# Patient Record
Sex: Female | Born: 1950
Health system: Southern US, Community
[De-identification: ages and names within clinical notes are randomized; demographics above are authoritative.]

## PROBLEM LIST (undated history)

## (undated) DIAGNOSIS — R000 Tachycardia, unspecified: Secondary | ICD-10-CM

## (undated) DIAGNOSIS — I714 Abdominal aortic aneurysm, without rupture, unspecified: Secondary | ICD-10-CM

## (undated) DIAGNOSIS — K219 Gastro-esophageal reflux disease without esophagitis: Secondary | ICD-10-CM

## (undated) DIAGNOSIS — I2510 Atherosclerotic heart disease of native coronary artery without angina pectoris: Secondary | ICD-10-CM

## (undated) DIAGNOSIS — Z952 Presence of prosthetic heart valve: Secondary | ICD-10-CM

## (undated) DIAGNOSIS — E7800 Pure hypercholesterolemia, unspecified: Secondary | ICD-10-CM

## (undated) DIAGNOSIS — M1990 Unspecified osteoarthritis, unspecified site: Secondary | ICD-10-CM

## (undated) DIAGNOSIS — I5042 Chronic combined systolic (congestive) and diastolic (congestive) heart failure: Secondary | ICD-10-CM

## (undated) DIAGNOSIS — J449 Chronic obstructive pulmonary disease, unspecified: Secondary | ICD-10-CM

## (undated) DIAGNOSIS — I350 Nonrheumatic aortic (valve) stenosis: Secondary | ICD-10-CM

## (undated) DIAGNOSIS — J302 Other seasonal allergic rhinitis: Secondary | ICD-10-CM

## (undated) DIAGNOSIS — J189 Pneumonia, unspecified organism: Secondary | ICD-10-CM

## (undated) DIAGNOSIS — Z720 Tobacco use: Secondary | ICD-10-CM

## (undated) DIAGNOSIS — J42 Unspecified chronic bronchitis: Secondary | ICD-10-CM

## (undated) DIAGNOSIS — H269 Unspecified cataract: Secondary | ICD-10-CM

## (undated) DIAGNOSIS — L0390 Cellulitis, unspecified: Secondary | ICD-10-CM

## (undated) DIAGNOSIS — I739 Peripheral vascular disease, unspecified: Secondary | ICD-10-CM

## (undated) DIAGNOSIS — C801 Malignant (primary) neoplasm, unspecified: Secondary | ICD-10-CM

## (undated) DIAGNOSIS — R918 Other nonspecific abnormal finding of lung field: Secondary | ICD-10-CM

## (undated) DIAGNOSIS — I255 Ischemic cardiomyopathy: Secondary | ICD-10-CM

## (undated) DIAGNOSIS — I340 Nonrheumatic mitral (valve) insufficiency: Secondary | ICD-10-CM

## (undated) DIAGNOSIS — Z9889 Other specified postprocedural states: Secondary | ICD-10-CM

## (undated) DIAGNOSIS — I219 Acute myocardial infarction, unspecified: Secondary | ICD-10-CM

## (undated) HISTORY — PX: MOUTH SURGERY: SHX715

## (undated) HISTORY — PX: CORONARY ANGIOPLASTY WITH STENT PLACEMENT: SHX49

## (undated) HISTORY — DX: Nonrheumatic aortic (valve) stenosis: I35.0

## (undated) HISTORY — DX: Other specified postprocedural states: Z98.890

## (undated) HISTORY — DX: Tobacco use: Z72.0

## (undated) HISTORY — DX: Unspecified cataract: H26.9

## (undated) HISTORY — DX: Gastro-esophageal reflux disease without esophagitis: K21.9

## (undated) HISTORY — DX: Abdominal aortic aneurysm, without rupture, unspecified: I71.40

## (undated) HISTORY — DX: Other seasonal allergic rhinitis: J30.2

## (undated) HISTORY — DX: Ischemic cardiomyopathy: I25.5

## (undated) HISTORY — DX: Pure hypercholesterolemia, unspecified: E78.00

## (undated) HISTORY — DX: Abdominal aortic aneurysm, without rupture: I71.4

## (undated) HISTORY — DX: Chronic combined systolic (congestive) and diastolic (congestive) heart failure: I50.42

---
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
## 1973-03-05 HISTORY — PX: GANGLION CYST EXCISION: SHX1691

## 1988-11-03 DIAGNOSIS — J189 Pneumonia, unspecified organism: Secondary | ICD-10-CM

## 1988-11-03 HISTORY — DX: Pneumonia, unspecified organism: J18.9

## 1998-03-02 ENCOUNTER — Emergency Department (HOSPITAL_COMMUNITY): Admission: EM | Admit: 1998-03-02 | Discharge: 1998-03-02 | Payer: Self-pay | Admitting: Emergency Medicine

## 1998-03-02 ENCOUNTER — Encounter: Payer: Self-pay | Admitting: Emergency Medicine

## 1999-08-21 ENCOUNTER — Other Ambulatory Visit: Admission: RE | Admit: 1999-08-21 | Discharge: 1999-08-21 | Payer: Self-pay | Admitting: Gynecology

## 2000-09-10 ENCOUNTER — Other Ambulatory Visit: Admission: RE | Admit: 2000-09-10 | Discharge: 2000-09-10 | Payer: Self-pay | Admitting: Gynecology

## 2001-09-23 ENCOUNTER — Other Ambulatory Visit: Admission: RE | Admit: 2001-09-23 | Discharge: 2001-09-23 | Payer: Self-pay | Admitting: Gynecology

## 2001-11-11 ENCOUNTER — Encounter: Admission: RE | Admit: 2001-11-11 | Discharge: 2001-11-11 | Payer: Self-pay | Admitting: Internal Medicine

## 2001-11-11 ENCOUNTER — Encounter: Payer: Self-pay | Admitting: Internal Medicine

## 2002-02-03 ENCOUNTER — Other Ambulatory Visit: Admission: RE | Admit: 2002-02-03 | Discharge: 2002-02-03 | Payer: Self-pay | Admitting: Gynecology

## 2002-10-26 ENCOUNTER — Other Ambulatory Visit: Admission: RE | Admit: 2002-10-26 | Discharge: 2002-10-26 | Payer: Self-pay | Admitting: Gynecology

## 2003-11-16 ENCOUNTER — Other Ambulatory Visit: Admission: RE | Admit: 2003-11-16 | Discharge: 2003-11-16 | Payer: Self-pay | Admitting: Gynecology

## 2004-01-03 ENCOUNTER — Ambulatory Visit (HOSPITAL_COMMUNITY): Admission: RE | Admit: 2004-01-03 | Discharge: 2004-01-03 | Payer: Self-pay | Admitting: Gastroenterology

## 2004-05-03 ENCOUNTER — Ambulatory Visit (HOSPITAL_COMMUNITY): Admission: RE | Admit: 2004-05-03 | Discharge: 2004-05-03 | Payer: Self-pay | Admitting: Gastroenterology

## 2011-02-15 ENCOUNTER — Other Ambulatory Visit: Payer: Self-pay | Admitting: Internal Medicine

## 2011-02-15 DIAGNOSIS — J984 Other disorders of lung: Secondary | ICD-10-CM

## 2011-02-16 ENCOUNTER — Other Ambulatory Visit: Payer: Self-pay

## 2011-02-22 ENCOUNTER — Other Ambulatory Visit: Payer: Self-pay

## 2011-02-28 ENCOUNTER — Ambulatory Visit
Admission: RE | Admit: 2011-02-28 | Discharge: 2011-02-28 | Disposition: A | Discharge status: Home / Self Care | Payer: BC Managed Care – PPO | Source: Ambulatory Visit | Attending: Internal Medicine | Admitting: Internal Medicine

## 2011-02-28 DIAGNOSIS — J984 Other disorders of lung: Secondary | ICD-10-CM

## 2011-02-28 MED ORDER — IOHEXOL 300 MG/ML  SOLN
75.0000 mL | Freq: Once | INTRAMUSCULAR | Status: AC | PRN
  Administered 2011-02-28: 75 mL via INTRAVENOUS

## 2011-03-05 ENCOUNTER — Other Ambulatory Visit: Payer: Self-pay | Admitting: Internal Medicine

## 2011-03-05 DIAGNOSIS — R911 Solitary pulmonary nodule: Secondary | ICD-10-CM

## 2011-08-20 ENCOUNTER — Ambulatory Visit
Admission: RE | Admit: 2011-08-20 | Discharge: 2011-08-20 | Disposition: A | Discharge status: Home / Self Care | Payer: BC Managed Care – PPO | Source: Ambulatory Visit | Attending: Internal Medicine | Admitting: Internal Medicine

## 2011-08-20 DIAGNOSIS — R911 Solitary pulmonary nodule: Secondary | ICD-10-CM

## 2011-08-20 MED ORDER — IOHEXOL 300 MG/ML  SOLN
75.0000 mL | Freq: Once | INTRAMUSCULAR | Status: AC | PRN
  Administered 2011-08-20: 75 mL via INTRAVENOUS

## 2011-08-24 ENCOUNTER — Ambulatory Visit (INDEPENDENT_AMBULATORY_CARE_PROVIDER_SITE_OTHER): Payer: BC Managed Care – PPO | Admitting: Internal Medicine

## 2011-08-24 ENCOUNTER — Encounter: Payer: Self-pay | Admitting: Internal Medicine

## 2011-08-24 VITALS — BP 148/88 | HR 88 | Temp 98.0°F | Ht 59.5 in | Wt 125.0 lb

## 2011-08-24 DIAGNOSIS — I359 Nonrheumatic aortic valve disorder, unspecified: Secondary | ICD-10-CM

## 2011-08-24 DIAGNOSIS — I350 Nonrheumatic aortic (valve) stenosis: Secondary | ICD-10-CM

## 2011-08-24 DIAGNOSIS — R918 Other nonspecific abnormal finding of lung field: Secondary | ICD-10-CM

## 2011-08-24 NOTE — Patient Instructions (Addendum)
You have a small nodule in right upper lobe that could be a atypical infection or an early tumor and best way to sort it out would be with a biopsy but I would do your physical with Dr Earl Gala 1st.  Tentatively we'll schedule your bronchoscopy for July 10  - [facility] outpatient registration 730 am  - don't eat or drink anything after midnight  - someone will need to drive you home and stay with you until lunch

## 2011-08-24 NOTE — Progress Notes (Signed)
  Subjective:    Patient ID: Teresa [name], female    DOB: September 28, 1950   MRN: [id number]  HPI   63 yowf active smoker referred 08/24/2011 to pulmonary clinic  by Dr Earl Gala for evaluation of spn    08/24/2011 1st pulmonary ov cc acute onset chest congestion Dec 2012 > cxr with RUL nodule > ct Pos SPN but all symptoms resolved with no hemoptysis or R CP and good ex tol in between flares of bronchitis maybe once a year and rare need for saba hfa.  In meantime repeat CT Chest c/w evolving increase in density RUL lesion so referred to pulmonary.  Has dx of mod to severe AS with most recent echo done 2 year prior to OV  But yearly eval due w/in a week.  No ex cp or presyncope, denies any limiting sob with desired activities.  Sleeping ok without nocturnal  or early am exacerbation  of respiratory  c/o's or need for noct saba. Also denies any obvious fluctuation of symptoms with weather or environmental changes or other aggravating or alleviating factors except as outlined above    Review of Systems  Constitutional: Negative for fever, chills and unexpected weight change.  HENT: Positive for congestion and sneezing. Negative for ear pain, nosebleeds, sore throat, rhinorrhea, trouble swallowing, dental problem, voice change, postnasal drip and sinus pressure.   Eyes: Negative for visual disturbance.  Respiratory: Positive for cough. Negative for choking and shortness of breath.   Cardiovascular: Negative for chest pain and leg swelling.  Gastrointestinal: Negative for vomiting, abdominal pain and diarrhea.  Genitourinary: Negative for difficulty urinating.  Musculoskeletal: Positive for arthralgias.  Skin: Negative for rash.  Neurological: Negative for tremors, syncope and headaches.  Hematological: Does not bruise/bleed easily.       Objective:   Physical Exam amb pleasant wf nad  Wt 125  08/24/11  HEENT: nl dentition, turbinates, and orophanx. Nl external ear canals without cough  reflex   NECK :  without JVD/Nodes/TM/ nl carotid upstrokes bilaterally   LUNGS: no acc muscle use, clear to A and P bilaterally without cough on insp or exp maneuvers   CV:  RRR  II - III/VI  Sem no def increase in P2  ABD:  soft and nontender with nl excursion in the supine position. No bruits or organomegaly, bowel sounds nl  MS:  warm without deformities, calf tenderness, cyanosis or clubbing  SKIN: warm and dry without lesions    NEURO:  alert, approp, no deficits         Assessment & Plan:

## 2011-08-25 DIAGNOSIS — R918 Other nonspecific abnormal finding of lung field: Secondary | ICD-10-CM | POA: Insufficient documentation

## 2011-08-25 DIAGNOSIS — I350 Nonrheumatic aortic (valve) stenosis: Secondary | ICD-10-CM | POA: Insufficient documentation

## 2011-08-25 NOTE — Assessment & Plan Note (Signed)
Not clearly symptomatic at this point

## 2011-08-25 NOTE — Assessment & Plan Note (Signed)
R >> L upper lobe nodules probably represent granulmatous lesions (? Atypical tb?) but the one on the R is getting more dense over a relatively should period of time and is worrisome for an early bronchogenic carcinoma.  PET can't really distinguish between these concerns well and best option would be to go ahead with at least an fob/tbbx at this point.  She is due for re-eval of AS next week and would like this addressed before elective bronchoscopy tentatively sent for July 10  Discussed in detail all the  indications, usual  risks and alternatives  relative to the benefits with patient who agrees to proceed with bronchoscopy with biopsy.

## 2011-09-05 ENCOUNTER — Telehealth: Payer: Self-pay | Admitting: *Deleted

## 2011-09-05 NOTE — Telephone Encounter (Signed)
error 

## 2011-09-06 ENCOUNTER — Telehealth: Payer: Self-pay | Admitting: Internal Medicine

## 2011-09-07 ENCOUNTER — Telehealth: Payer: Self-pay | Admitting: Internal Medicine

## 2011-09-12 ENCOUNTER — Ambulatory Visit (HOSPITAL_COMMUNITY)
Admission: RE | Admit: 2011-09-12 | Discharge: 2011-09-12 | Disposition: A | Discharge status: Home / Self Care | Payer: BC Managed Care – PPO | Source: Ambulatory Visit | Attending: Internal Medicine | Admitting: Internal Medicine

## 2011-09-12 ENCOUNTER — Ambulatory Visit (HOSPITAL_COMMUNITY): Payer: BC Managed Care – PPO

## 2011-09-12 ENCOUNTER — Encounter (HOSPITAL_COMMUNITY)
Admission: RE | Disposition: A | Discharge status: Home / Self Care | Payer: Self-pay | Source: Ambulatory Visit | Attending: Internal Medicine

## 2011-09-12 ENCOUNTER — Encounter (HOSPITAL_COMMUNITY): Payer: Self-pay | Admitting: Radiology

## 2011-09-12 DIAGNOSIS — I359 Nonrheumatic aortic valve disorder, unspecified: Secondary | ICD-10-CM | POA: Insufficient documentation

## 2011-09-12 DIAGNOSIS — R05 Cough: Secondary | ICD-10-CM | POA: Insufficient documentation

## 2011-09-12 DIAGNOSIS — R059 Cough, unspecified: Secondary | ICD-10-CM | POA: Insufficient documentation

## 2011-09-12 DIAGNOSIS — R6889 Other general symptoms and signs: Secondary | ICD-10-CM | POA: Insufficient documentation

## 2011-09-12 DIAGNOSIS — J984 Other disorders of lung: Secondary | ICD-10-CM | POA: Insufficient documentation

## 2011-09-12 DIAGNOSIS — J3489 Other specified disorders of nose and nasal sinuses: Secondary | ICD-10-CM | POA: Insufficient documentation

## 2011-09-12 DIAGNOSIS — R911 Solitary pulmonary nodule: Secondary | ICD-10-CM | POA: Insufficient documentation

## 2011-09-12 DIAGNOSIS — R918 Other nonspecific abnormal finding of lung field: Secondary | ICD-10-CM

## 2011-09-12 DIAGNOSIS — F17200 Nicotine dependence, unspecified, uncomplicated: Secondary | ICD-10-CM | POA: Insufficient documentation

## 2011-09-12 HISTORY — PX: VIDEO BRONCHOSCOPY: SHX5072

## 2011-09-12 SURGERY — BRONCHOSCOPY, WITH FLUOROSCOPY
Anesthesia: Moderate Sedation | Laterality: Bilateral

## 2011-09-12 MED ORDER — PHENYLEPHRINE HCL 0.25 % NA SOLN
NASAL | Status: DC | PRN
  Administered 2011-09-12: 2 via NASAL

## 2011-09-12 MED ORDER — LIDOCAINE HCL 2 % EX GEL: Freq: Once | CUTANEOUS | Status: DC

## 2011-09-12 MED ORDER — MIDAZOLAM HCL 10 MG/2ML IJ SOLN
INTRAMUSCULAR | Status: DC | PRN
  Administered 2011-09-12: 2.5 mg via INTRAVENOUS

## 2011-09-12 MED ORDER — LIDOCAINE HCL 2 % EX GEL
CUTANEOUS | Status: DC | PRN
  Administered 2011-09-12: 1

## 2011-09-12 MED ORDER — PHENYLEPHRINE HCL 0.25 % NA SOLN
1.0000 | Freq: Four times a day (QID) | NASAL | Status: DC | PRN
  Filled 2011-09-12: qty 15

## 2011-09-12 MED ORDER — MEPERIDINE HCL 25 MG/ML IJ SOLN
INTRAMUSCULAR | Status: DC | PRN
  Administered 2011-09-12 (×2): 25 mg via INTRAVENOUS

## 2011-09-12 MED ORDER — MIDAZOLAM HCL 10 MG/2ML IJ SOLN
INTRAMUSCULAR | Status: AC
  Filled 2011-09-12: qty 4

## 2011-09-12 MED ORDER — MEPERIDINE HCL 100 MG/ML IJ SOLN
INTRAMUSCULAR | Status: AC
  Filled 2011-09-12: qty 2

## 2011-09-12 MED ORDER — LIDOCAINE HCL 1 % IJ SOLN
INTRAMUSCULAR | Status: DC | PRN
  Administered 2011-09-12: 6 mL via RESPIRATORY_TRACT

## 2011-09-12 NOTE — H&P (Signed)
  60 yowf active smoker referred 08/24/2011 to pulmonary clinic by Dr Earl Gala for evaluation of spn   08/24/2011 1st pulmonary ov cc acute onset chest congestion Dec 2012 > cxr with RUL nodule > ct Pos SPN but all symptoms resolved with no hemoptysis or R CP and good ex tol in between flares of bronchitis maybe once a year and rare need for saba hfa. In meantime repeat CT Chest c/w evolving increase in density RUL lesion so referred to pulmonary.   Has dx of mod to severe AS with most recent echo done 2 year prior to OV But yearly eval due w/in a week. No ex cp or presyncope, denies any limiting sob with desired activities.   Sleeping ok without nocturnal or early am exacerbation of respiratory c/o's or need for noct saba. Also denies any obvious fluctuation of symptoms with weather or environmental changes or other aggravating or alleviating factors except as outlined above    Review of Systems  Constitutional: Negative for fever, chills and unexpected weight change.  HENT: Positive for congestion and sneezing. Negative for ear pain, nosebleeds, sore throat, rhinorrhea, trouble swallowing, dental problem, voice change, postnasal drip and sinus pressure.  Eyes: Negative for visual disturbance.  Respiratory: Positive for cough. Negative for choking and shortness of breath.  Cardiovascular: Negative for chest pain and leg swelling.  Gastrointestinal: Negative for vomiting, abdominal pain and diarrhea.  Genitourinary: Negative for difficulty urinating.  Musculoskeletal: Positive for arthralgias.  Skin: Negative for rash.  Neurological: Negative for tremors, syncope and headaches.  Hematological: Does not bruise/bleed easily.    Objective:   Physical Exam  amb pleasant wf nad  Wt 125 08/24/11  HEENT: nl dentition, turbinates, and orophanx. Nl external ear canals without cough reflex  NECK : without JVD/Nodes/TM/ nl carotid upstrokes bilaterally  LUNGS: no acc muscle use, clear to A and P  bilaterally without cough on insp or exp maneuvers  CV: RRR II - III/VI Sem no def increase in P2  ABD: soft and nontender with nl excursion in the supine position. No bruits or organomegaly, bowel sounds nl  MS: warm without deformities, calf tenderness, cyanosis or clubbing  SKIN: warm and dry without lesions  NEURO: alert, approp, no deficits   Assessment & Plan:    Previous Version  Multiple pulmonary nodules - Sandrea Hughs, MD 08/25/2011 1:26 PM Signed  R >> L upper lobe nodules probably represent granulmatous lesions (? Atypical tb?) but the one on the R is getting more dense over a relatively should period of time and is worrisome for an early bronchogenic carcinoma. PET can't really distinguish between these concerns well and best option would be to go ahead with at least an fob/tbbx at this point.  She is due for re-eval of AS next week and would like this addressed before elective bronchoscopy tentatively sent for July 10  Discussed in detail all the indications, usual risks and alternatives relative to the benefits with patient who agrees to proceed with bronchoscopy with biopsy.  Aortic stenosis - Sandrea Hughs, MD 08/25/2011 1:27 PM Signed  Not clearly symptomatic at this point

## 2011-09-12 NOTE — Discharge Instructions (Signed)
Bronchoscopy Care After Refer to this sheet in the next few weeks. These discharge instructions provide you with general information on caring for yourself after you leave the hospital. Your caregiver may also give you specific instructions. Your treatment has been planned according to the most current medical practices available, but unavoidable complications sometimes occur. If you have any problems or questions after discharge, please call your caregiver. HOME CARE INSTRUCTIONS   You may resume normal activities.   Call your caregiver or return for an appointment as instructed by your caregiver if biopsies were taken.   Do not eat or drink anything until cough and gag reflexes have returned. There is danger of burning yourself or getting food or water into your lungs when your mouth and airways are numb. After the numbness is gone, you may begin taking a normal diet.  Finding out the results of your test Not all test results are available during your visit. If your test results are not back during the visit, make an appointment with your caregiver to find out the results. Do not assume everything is normal if you have not heard from your caregiver or the medical facility. It is important for you to follow up on all of your test results. SEEK IMMEDIATE MEDICAL CARE IF:   You become lightheaded.   You get short of breath.   You become faint.   You develop chest pain.   You cough up blood.  MAKE SURE YOU:   Understand these instructions.   Will watch your condition.   Will get help right away if you are not doing well or get worse.  Document Released: 09/08/2004 Document Revised: 02/08/2011 Document Reviewed: 02/17/2008 [facility] Patient Information 2012 [place], [place].

## 2011-09-12 NOTE — Progress Notes (Signed)
Bronch w/ video intervention performed.  Bronchial washing intervention performed. 

## 2011-09-12 NOTE — Op Note (Signed)
Bronchoscopy Procedure Note  Date of Operation: 08/03/2011  Pre-op Diagnosis: lung nodules  Post-op Diagnosis: lung nodules  Surgeon: Sandrea Hughs  Anesthesia: Monitored Local Anesthesia with Sedation  Operation: Video Flexible fiberoptic bronchoscopy, diagnostic   Findings: Nl airwasy  Specimen: Lavage RUL  Estimated Blood Loss: Minimal  Complications:  None  Indications and History: The patient is a 57 yowf with R apical nodular denisity.   The risks, benefits, complications, treatment options and expected outcomes were discussed with the patient.  The possibilities of reaction to medication, pulmonary aspiration, perforation of a viscus, bleeding, failure to diagnose a condition and creating a complication requiring transfusion or operation were discussed with the patient who freely signed the consent.    Description of Procedure: The patient was re-examined in the bronchoscopy suite  The patient was identified   procedure verified as Flexible Fiberoptic Bronchoscopy.  A Time Out was held and the above information confirmed.   After the induction of topical nasopharyngeal anesthesia, the patient was positioned  and the bronchoscope was passed through the Right naris. The vocal cords were visualized and  1% buffered lidocaine 5 ml was topically placed onto the cords. The cords were nl. The scope was then passed into the trachea.  1% buffered lidocaine given topically. Airways inspected bilaterally to the subsegmental level with the following findings:  All airways opened widely an were normal  Procedure RUL selectively lavaged but unable to perform a wedged position in the apical segment nor reach the RUL nodule through the posterior or anterior segments.  The angle for the apical segment was too acute and even repositioning the scope to the Left of the patient did not allow this segment to be accessed so the TBBX was canceled.  Lavage sent for cyt and afb stain and  culture.   The Patient was taken to the Endoscopy Recovery area in satisfactory condition.  Attestation: I performed the procedure.  Sandrea Hughs, MD Pulmonary and Critical Care Medicine [facility] Cell [phone]

## 2011-09-25 ENCOUNTER — Institutional Professional Consult (permissible substitution): Payer: BC Managed Care – PPO | Admitting: Internal Medicine

## 2011-10-04 ENCOUNTER — Telehealth: Payer: Self-pay | Admitting: Internal Medicine

## 2011-10-04 DIAGNOSIS — R918 Other nonspecific abnormal finding of lung field: Secondary | ICD-10-CM

## 2011-10-04 NOTE — Telephone Encounter (Signed)
Every thing is neg so far but can take up to 6 weeks to process this kind of material  We need to see her back in office with cxr and pfts in 4 weeks and decide then how aggressive to be

## 2011-10-04 NOTE — Telephone Encounter (Signed)
Pt advised and rov with PFT and CXR set for 11-07-11.Carron Curie, CMA

## 2011-10-04 NOTE — Telephone Encounter (Signed)
Called and spoke with patient to verify mess, patient requesting results on bronch.  Dr. Sherene Sires can you please advise! Thank You

## 2011-10-25 LAB — AFB CULTURE WITH SMEAR (NOT AT ARMC): Acid Fast Smear: NONE SEEN

## 2011-11-07 ENCOUNTER — Ambulatory Visit (INDEPENDENT_AMBULATORY_CARE_PROVIDER_SITE_OTHER): Payer: BC Managed Care – PPO | Admitting: Internal Medicine

## 2011-11-07 ENCOUNTER — Ambulatory Visit (INDEPENDENT_AMBULATORY_CARE_PROVIDER_SITE_OTHER)
Admission: RE | Admit: 2011-11-07 | Discharge: 2011-11-07 | Disposition: A | Discharge status: Home / Self Care | Payer: BC Managed Care – PPO | Source: Ambulatory Visit | Attending: Internal Medicine | Admitting: Internal Medicine

## 2011-11-07 ENCOUNTER — Encounter: Payer: Self-pay | Admitting: Internal Medicine

## 2011-11-07 VITALS — BP 138/80 | HR 109 | Temp 98.6°F | Ht 59.0 in | Wt 122.0 lb

## 2011-11-07 DIAGNOSIS — R918 Other nonspecific abnormal finding of lung field: Secondary | ICD-10-CM

## 2011-11-07 DIAGNOSIS — J449 Chronic obstructive pulmonary disease, unspecified: Secondary | ICD-10-CM

## 2011-11-07 DIAGNOSIS — F17200 Nicotine dependence, unspecified, uncomplicated: Secondary | ICD-10-CM | POA: Insufficient documentation

## 2011-11-07 LAB — PULMONARY FUNCTION TEST

## 2011-11-07 NOTE — Progress Notes (Signed)
  Subjective:    Patient ID: Teresa [name], female    DOB: 07-15-1950   MRN: [id number]     Brief patient profile:  74 yowf active smoker referred 08/24/2011 to pulmonary clinic  by Dr Earl Gala for evaluation of spn   HPI 08/24/2011 1st pulmonary ov cc acute onset chest congestion Dec 2012 > cxr with RUL nodule > ct Pos SPN but all symptoms resolved with no hemoptysis or R CP and good ex tol in between flares of bronchitis maybe once a year and rare need for saba hfa.  In meantime repeat CT Chest c/w evolving increase in density RUL lesion so referred to pulmonary. Has dx of mod to severe AS with most recent echo done 2 year prior to OV  But yearly eval due w/in a week.  No ex cp or presyncope, denies any limiting sob with desired activities. rec You have a small nodule in right upper lobe that could be a atypical infection or an early tumor and best way to sort it out would be with a biopsy but I would do your physical with Dr Earl Gala 1st. Tentatively we'll schedule your bronchoscopy for July 10   11/07/2011 f/u ov/Wert cc breathing much worse x one week some better since rx 9/2 with prednisone and saba, no hemoptysis.  Prior to flare denies limiting sob or  obvious daytime variabilty or assoc chronic cough or cp or chest tightness, subjective wheeze overt sinus or hb symptoms. No unusual exp hx    Sleeping ok without nocturnal  or early am exacerbation  of respiratory  c/o's or need for noct saba. Also denies any obvious fluctuation of symptoms with weather or environmental changes or other aggravating or alleviating factors except as outlined above   ROS  The following are not active complaints unless bolded sore throat, dysphagia, dental problems, itching, sneezing,  nasal congestion or excess/ purulent secretions, ear ache,   fever, chills, sweats, unintended wt loss, pleuritic or exertional cp, hemoptysis,  orthopnea pnd or leg swelling, presyncope, palpitations, heartburn, abdominal pain,  anorexia, nausea, vomiting, diarrhea  or change in bowel or urinary habits, change in stools or urine, dysuria,hematuria,  rash, arthralgias, visual complaints, headache, numbness weakness or ataxia or problems with walking or coordination,  change in mood/affect or memory.             Objective:   Physical Exam  Anxious jittery ambulatory wf at times borderline incoherent responses hung up on dx of "bronchitis is all I have"  Wt 125  08/24/11 >  11/07/2011  122  HEENT: nl dentition, turbinates, and orophanx. Nl external ear canals without cough reflex   NECK :  without JVD/Nodes/TM/ nl carotid upstrokes bilaterally   LUNGS: no acc muscle use, clear to A and P bilaterally without cough on insp or exp maneuvers   CV:  RRR  II - III/VI  Sem no def increase in P2  ABD:  soft and nontender with nl excursion in the supine position. No bruits or organomegaly, bowel sounds nl  MS:  warm without deformities, calf tenderness, cyanosis or clubbing  SKIN: warm and dry without lesions     CXR  11/07/2011 :  Right upper lobe irregular opacity with associated nodularity and  pleural thickening        Assessment & Plan:

## 2011-11-07 NOTE — Patient Instructions (Signed)
The key is to stop smoking completely before smoking completely stops you- it's not too late!   CT chest limited to Right upper lobe 1st week in December 2013 (tickle file)

## 2011-11-07 NOTE — Assessment & Plan Note (Signed)
-   PFT's 11/07/2011 FEV1  0.96 (51%) ratio 50 and no better p B2, DLCO 78%  GOLD II/III still smoking with tendency to "bad brnchitis"   See smoking discussion separately.   Would need another spirometry s "bronchitis" flare and stop smoking completely before considering excisional bx

## 2011-11-07 NOTE — Assessment & Plan Note (Signed)
-   CT Chest 08/20/11 1. Increased nodularity of a right upper lobe parenchymal opacity  seen on the prior study is nonspecific and could be post infectious  although neoplasm could create a similar appearance. Short-term  follow-up chest CT in 3 months for PET CT scan recommend for  further evaluation.  2. No change in a small ground-glass nodular opacity in the left  upper lobe. - 09/12/2011 FOB with tbbx RUL > no lesions, not able to access R apex for TBBX  I had an extended discussion with the patient today lasting 15 to 20 minutes of a 25 minute visit on the following issues:  cxr no evolving mass R apex but best approach here is CT chest in 3 months and excisional bx if growing and if able to quit smoking and improve her pft's above present level.  If not then do PET and IR directed bx of most accessible site.

## 2011-11-07 NOTE — Assessment & Plan Note (Signed)
I reviewed the Flethcher curve with patient that basically indicates  if you quit smoking when your best day FEV1 is still relativley well preserved it is highly unlikely you will progress to severe disease and informed the patient there was no medication on the market that has proven to change the curve or the likelihood of progression.  Therefore stopping smoking and maintaining abstinence is the most important aspect of care, not choice of inhalers or for that matter, doctors.    At the very least would hope to see less severe episodes of her "bronchitis" if she quits completely because apparently her worst days are nearly intolerable at present making me very reluctant to consider referral for any form of lung resection.

## 2011-11-07 NOTE — Progress Notes (Signed)
PFT done today. 

## 2011-11-16 ENCOUNTER — Encounter: Payer: Self-pay | Admitting: Internal Medicine

## 2012-01-25 ENCOUNTER — Telehealth: Payer: Self-pay | Admitting: *Deleted

## 2012-01-25 DIAGNOSIS — R911 Solitary pulmonary nodule: Secondary | ICD-10-CM

## 2012-01-25 NOTE — Telephone Encounter (Signed)
Message copied by Christen Butter on Fri Jan 25, 2012  4:49 PM ------      Message from: Sandrea Hughs B      Created: Wed Nov 07, 2011  2:01 PM       Make sure she has a ct limted to RUL nodule scheduled by now

## 2012-01-25 NOTE — Telephone Encounter (Signed)
Order for CT was sent to [facility]  Pt aware

## 2012-01-30 ENCOUNTER — Other Ambulatory Visit: Payer: BC Managed Care – PPO

## 2012-02-01 ENCOUNTER — Encounter: Payer: Self-pay | Admitting: Internal Medicine

## 2012-02-01 ENCOUNTER — Ambulatory Visit (INDEPENDENT_AMBULATORY_CARE_PROVIDER_SITE_OTHER)
Admission: RE | Admit: 2012-02-01 | Discharge: 2012-02-01 | Disposition: A | Discharge status: Home / Self Care | Payer: BC Managed Care – PPO | Source: Ambulatory Visit | Attending: Internal Medicine | Admitting: Internal Medicine

## 2012-02-01 DIAGNOSIS — R911 Solitary pulmonary nodule: Secondary | ICD-10-CM

## 2012-02-04 NOTE — Progress Notes (Signed)
Quick Note:  Spoke with pt and notified of results per Dr. Wert. Pt verbalized understanding and denied any questions.  ______ 

## 2012-06-23 ENCOUNTER — Encounter (INDEPENDENT_AMBULATORY_CARE_PROVIDER_SITE_OTHER): Payer: Self-pay

## 2012-06-23 ENCOUNTER — Inpatient Hospital Stay (HOSPITAL_COMMUNITY)
Admission: EM | Admit: 2012-06-23 | Discharge: 2012-06-29 | DRG: 563 | Disposition: A | Discharge status: Home / Self Care | Payer: BC Managed Care – PPO | Attending: Internal Medicine | Admitting: Internal Medicine

## 2012-06-23 ENCOUNTER — Ambulatory Visit (INDEPENDENT_AMBULATORY_CARE_PROVIDER_SITE_OTHER): Payer: BC Managed Care – PPO | Admitting: General Surgery

## 2012-06-23 ENCOUNTER — Encounter (INDEPENDENT_AMBULATORY_CARE_PROVIDER_SITE_OTHER): Payer: Self-pay | Admitting: General Surgery

## 2012-06-23 ENCOUNTER — Encounter (HOSPITAL_COMMUNITY): Payer: Self-pay | Admitting: *Deleted

## 2012-06-23 VITALS — BP 110/70 | HR 126 | Temp 98.4°F | Ht 59.5 in | Wt 124.6 lb

## 2012-06-23 DIAGNOSIS — S51859A Open bite of unspecified forearm, initial encounter: Secondary | ICD-10-CM

## 2012-06-23 DIAGNOSIS — I359 Nonrheumatic aortic valve disorder, unspecified: Secondary | ICD-10-CM | POA: Diagnosis present

## 2012-06-23 DIAGNOSIS — I350 Nonrheumatic aortic (valve) stenosis: Secondary | ICD-10-CM | POA: Diagnosis present

## 2012-06-23 DIAGNOSIS — R918 Other nonspecific abnormal finding of lung field: Secondary | ICD-10-CM

## 2012-06-23 DIAGNOSIS — Y92009 Unspecified place in unspecified non-institutional (private) residence as the place of occurrence of the external cause: Secondary | ICD-10-CM

## 2012-06-23 DIAGNOSIS — S51851A Open bite of right forearm, initial encounter: Secondary | ICD-10-CM

## 2012-06-23 DIAGNOSIS — J309 Allergic rhinitis, unspecified: Secondary | ICD-10-CM | POA: Diagnosis present

## 2012-06-23 DIAGNOSIS — J4489 Other specified chronic obstructive pulmonary disease: Secondary | ICD-10-CM | POA: Diagnosis present

## 2012-06-23 DIAGNOSIS — Z880 Allergy status to penicillin: Secondary | ICD-10-CM

## 2012-06-23 DIAGNOSIS — Z881 Allergy status to other antibiotic agents status: Secondary | ICD-10-CM

## 2012-06-23 DIAGNOSIS — J181 Lobar pneumonia, unspecified organism: Secondary | ICD-10-CM

## 2012-06-23 DIAGNOSIS — L0390 Cellulitis, unspecified: Secondary | ICD-10-CM | POA: Diagnosis present

## 2012-06-23 DIAGNOSIS — K219 Gastro-esophageal reflux disease without esophagitis: Secondary | ICD-10-CM | POA: Diagnosis present

## 2012-06-23 DIAGNOSIS — J189 Pneumonia, unspecified organism: Secondary | ICD-10-CM | POA: Diagnosis not present

## 2012-06-23 DIAGNOSIS — J449 Chronic obstructive pulmonary disease, unspecified: Secondary | ICD-10-CM | POA: Diagnosis present

## 2012-06-23 DIAGNOSIS — M1610 Unilateral primary osteoarthritis, unspecified hip: Secondary | ICD-10-CM | POA: Diagnosis present

## 2012-06-23 DIAGNOSIS — R011 Cardiac murmur, unspecified: Secondary | ICD-10-CM | POA: Diagnosis present

## 2012-06-23 DIAGNOSIS — E785 Hyperlipidemia, unspecified: Secondary | ICD-10-CM | POA: Diagnosis present

## 2012-06-23 DIAGNOSIS — S51851D Open bite of right forearm, subsequent encounter: Secondary | ICD-10-CM

## 2012-06-23 DIAGNOSIS — Z9089 Acquired absence of other organs: Secondary | ICD-10-CM

## 2012-06-23 DIAGNOSIS — F17200 Nicotine dependence, unspecified, uncomplicated: Secondary | ICD-10-CM

## 2012-06-23 DIAGNOSIS — IMO0002 Reserved for concepts with insufficient information to code with codable children: Principal | ICD-10-CM | POA: Diagnosis present

## 2012-06-23 DIAGNOSIS — L0291 Cutaneous abscess, unspecified: Secondary | ICD-10-CM

## 2012-06-23 DIAGNOSIS — Z79899 Other long term (current) drug therapy: Secondary | ICD-10-CM

## 2012-06-23 DIAGNOSIS — A491 Streptococcal infection, unspecified site: Secondary | ICD-10-CM | POA: Diagnosis present

## 2012-06-23 DIAGNOSIS — R911 Solitary pulmonary nodule: Secondary | ICD-10-CM | POA: Diagnosis present

## 2012-06-23 DIAGNOSIS — T1490XA Injury, unspecified, initial encounter: Secondary | ICD-10-CM | POA: Diagnosis present

## 2012-06-23 DIAGNOSIS — H269 Unspecified cataract: Secondary | ICD-10-CM | POA: Diagnosis present

## 2012-06-23 DIAGNOSIS — W540XXA Bitten by dog, initial encounter: Secondary | ICD-10-CM

## 2012-06-23 HISTORY — DX: Cellulitis, unspecified: L03.90

## 2012-06-23 HISTORY — DX: Unspecified osteoarthritis, unspecified site: M19.90

## 2012-06-23 HISTORY — DX: Other nonspecific abnormal finding of lung field: R91.8

## 2012-06-23 LAB — COMPREHENSIVE METABOLIC PANEL
AST: 22 U/L (ref 0–37)
CO2: 25 mEq/L (ref 19–32)
Calcium: 9.3 mg/dL (ref 8.4–10.5)
Chloride: 96 mEq/L (ref 96–112)
Creatinine, Ser: 0.6 mg/dL (ref 0.50–1.10)
GFR calc Af Amer: >90 mL/min (ref >90)
GFR calc non Af Amer: >90 mL/min (ref >90)
Glucose, Bld: 133 mg/dL — ABNORMAL HIGH (ref 70–99)
Total Bilirubin: 0.5 mg/dL (ref 0.3–1.2)

## 2012-06-23 LAB — CBC WITH DIFFERENTIAL/PLATELET
Eosinophils Relative: 0 % (ref 0–5)
HCT: 42.7 % (ref 36.0–46.0)
Hemoglobin: 15 g/dL (ref 12.0–15.0)
Lymphocytes Relative: 9 % — ABNORMAL LOW (ref 12–46)
Lymphs Abs: 1.3 10*3/uL (ref 0.7–4.0)
MCV: 89.5 fL (ref 78.0–100.0)
Monocytes Absolute: 1.1 10*3/uL — ABNORMAL HIGH (ref 0.1–1.0)
Monocytes Relative: 7 % (ref 3–12)
Neutro Abs: 12.8 10*3/uL — ABNORMAL HIGH (ref 1.7–7.7)
RBC: 4.77 MIL/uL (ref 3.87–5.11)
RDW: 13.1 % (ref 11.5–15.5)
WBC: 15.1 10*3/uL — ABNORMAL HIGH (ref 4.0–10.5)

## 2012-06-23 MED ORDER — SODIUM CHLORIDE 0.9 % IV BOLUS (SEPSIS)
1000.0000 mL | Freq: Once | INTRAVENOUS | Status: AC
  Administered 2012-06-23: 1000 mL via INTRAVENOUS

## 2012-06-23 MED ORDER — CLINDAMYCIN PHOSPHATE 600 MG/50ML IV SOLN
600.0000 mg | Freq: Once | INTRAVENOUS | Status: AC
  Administered 2012-06-23: 600 mg via INTRAVENOUS
  Filled 2012-06-23: qty 50

## 2012-06-23 MED ORDER — MORPHINE SULFATE 4 MG/ML IJ SOLN
4.0000 mg | Freq: Once | INTRAMUSCULAR | Status: AC
  Administered 2012-06-23: 4 mg via INTRAVENOUS
  Filled 2012-06-23: qty 1

## 2012-06-23 NOTE — ED Notes (Signed)
Pt was bitten by her dog on Thursday.  Her dog has not had rabies yet, but does not go out of her yard.  Presently R anterior forearm is red, swollen and warm.  Pt was sent here by Dr Dwain Sarna and Dr Earl Gala for admit to hospital.  However, admitting was unable to rcv report from either MD, so pt was told to come through ED.  Pt tachycardic and febrile.

## 2012-06-23 NOTE — ED Notes (Signed)
Pt. Wound outlined

## 2012-06-23 NOTE — Progress Notes (Signed)
Subjective:     Patient ID: Teresa [name], female   DOB: June 23, 1950, 62 y.o.   MRN: [id number]  HPI 89 yof who sustained puncture wound from baby collie last Thursday.  This is on right forearm and she presented to primary care today as she has increasing redness and tenderness.  She is able to move her arm well.  Her temp at pcp earlier today was 102.3 and heart rate was 110.  Her hr here is 126.  This has drained since she was at their office earlier  Review of Systems  Constitutional: Negative for fever, chills and unexpected weight change.  HENT: Negative for hearing loss, congestion, sore throat, trouble swallowing and voice change.   Eyes: Negative for visual disturbance.  Respiratory: Negative for cough and wheezing.   Cardiovascular: Negative for chest pain, palpitations and leg swelling.  Gastrointestinal: Negative for nausea, vomiting, abdominal pain, diarrhea, constipation, blood in stool, abdominal distention and anal bleeding.  Genitourinary: Negative for hematuria, vaginal bleeding and difficulty urinating.  Musculoskeletal: Negative for arthralgias.  Skin: Negative for rash and wound.  Neurological: Negative for seizures, syncope and headaches.  Hematological: Negative for adenopathy. Does not bruise/bleed easily.  Psychiatric/Behavioral: Negative for confusion.       Objective:   Physical Exam  Vitals reviewed. Constitutional: She appears well-developed and well-nourished.  Cardiovascular:  Pulses:      Radial pulses are 2+ on the right side.  Musculoskeletal:       Arms: Neurological: She has normal strength. No sensory deficit.  Reflex Scores:      Brachioradialis reflexes are 2+ on the right side. Right forearm is nvi   Skin: She is not diaphoretic.       Assessment:     Cellulitis right forearm     Plan:     I don't think she needs anything else drained right now but will need to be followed to make sure. She has tachycardia, fever earlier and  smokes.  She should be admitted with iv abx and we can follow to ensure no surgery needed.  Will need neuro checks to [facility]

## 2012-06-23 NOTE — ED Provider Notes (Signed)
History     CSN: [id number]  Arrival date & time 06/23/12  1653   First MD Initiated Contact with Patient 06/23/12 2033      Chief Complaint  Patient presents with  . Animal Bite  . Cellulitis    (Consider location/radiation/quality/duration/timing/severity/associated sxs/prior treatment) HPI History provided by pt and prior chart.  Pt reports that she was bitten by her puppy on right forearm 5 days ago.  Yesterday she developed surrounding erythema and edema and puncture wound began to drain purulent fluid.  Moderately painful.  Associated w/ fever today, which she has treated w/ advil; most recent dose 6pm.  Per prior chart, pt evaluated by Dr. Dwain Sarna who did not feel that I&D was necessary but referred back to her PCP for admission for IV abx.   Past Medical History  Diagnosis Date  . GERD (gastroesophageal reflux disease)   . Hypercholesterolemia   . Seasonal allergies   . Tobacco abuse   . Cataract   . Aortic valve stenosis, moderate     Past Surgical History  Procedure Laterality Date  . Wrist surgery  1974  . Tonsillectomy  age 49  . Mouth surgery    . Video bronchoscopy  09/12/2011    Procedure: VIDEO BRONCHOSCOPY WITH FLUORO;  Surgeon: Nyoka Cowden, MD;  Location: [facility];  Service: Cardiopulmonary;  Laterality: Bilateral;    Family History  Problem Relation Age of Onset  . Alcohol abuse Father     History  Substance Use Topics  . Smoking status: Current Some Day Smoker -- 0.50 packs/day for 30 years    Types: Cigarettes  . Smokeless tobacco: Never Used  . Alcohol Use: No    OB History   Grav Para Term Preterm Abortions TAB SAB Ect Mult Living                  Review of Systems  All other systems reviewed and are negative.    Allergies  Azithromycin; Ceclor; Doxycycline; Levaquin; Penicillins; and Septra  Home Medications   Current Outpatient Rx  Name  Route  Sig  Dispense  Refill  . albuterol (PROVENTIL HFA) 108 (90 BASE) MCG/ACT  inhaler   Inhalation   Inhale 2 puffs into the lungs every 6 (six) hours as needed for shortness of breath.          . Coenzyme Q10 (CO Q 10) 100 MG CAPS   Oral   Take 1 capsule by mouth daily.         . lansoprazole (PREVACID) 30 MG capsule   Oral   Take 30 mg by mouth daily.         . Misc Natural Products (BLACK COHOSH MENOPAUSE COMPLEX) TABS   Oral   Take 1 tablet by mouth daily.         . rosuvastatin (CRESTOR) 10 MG tablet   Oral   Take 10 mg by mouth every Monday, Wednesday, and Friday. 1 tablet at bedtime three times per wk           BP 102/56  Pulse 108  Temp(Src) 99.9 F (37.7 C) (Oral)  Resp 16  Ht 4' 11.5" (1.511 m)  Wt 122 lb (55.339 kg)  BMI 24.24 kg/m2  SpO2 92%  LMP 03/25/2011  Physical Exam  Nursing note and vitals reviewed. Constitutional: She is oriented to person, place, and time. She appears well-developed and well-nourished. No distress.  HENT:  Head: Normocephalic and atraumatic.  Eyes:  Normal appearance  Neck: Normal range of motion.  Cardiovascular: Normal rate and regular rhythm.   Pulmonary/Chest: Effort normal and breath sounds normal. No respiratory distress.  Diffuse inspiratory wheezing and expiratory rhonchi (pt attributes to seasonal allergies)  Musculoskeletal: Normal range of motion.  Non-draining puncture wound at center of flexor surface of right forearm.  Entire flexor surface erythematous, warm and mildly indurated and tender.  Lymphatic streaking into upper arm.  Full, active and non-painful ROM of elbow and wrist.  2+ radial pulse and distal sensation intact.      Neurological: She is alert and oriented to person, place, and time.  Skin: Skin is warm and dry. No rash noted.  Psychiatric: She has a normal mood and affect. Her behavior is normal.    ED Course  Procedures (including critical care time)  Labs Reviewed  CBC WITH DIFFERENTIAL - Abnormal; Notable for the following:    WBC 15.1 (*)    Neutrophils  Relative 84 (*)    Neutro Abs 12.8 (*)    Lymphocytes Relative 9 (*)    Monocytes Absolute 1.1 (*)    All other components within normal limits  COMPREHENSIVE METABOLIC PANEL - Abnormal; Notable for the following:    Sodium 132 (*)    Glucose, Bld 133 (*)    All other components within normal limits   No results found.   1. Cellulitis   2. Aortic stenosis   3. COPD (chronic obstructive pulmonary disease)   4. Multiple pulmonary nodules   5. Dog bite of forearm, right, initial encounter       MDM  61yo F presents w/ cellulitis of right forearm secondary to dog bite.  Referred to ED for admission for IV abx by general surgery.  Elevated temp and HR.   Pt has received first dose of clinda as well as morphine and NS bolus.  [facility] consulted for admission.    BP decreased to 89/50.  Pt looks well and has no complaints.  Second line and second liter bolus initiated.  Dr. Anitra Lauth aware.          Otilio Miu, PA-C 06/24/12 [phone]

## 2012-06-23 NOTE — H&P (Signed)
PCP:   Darnelle Bos, MD   Chief Complaint:  Dog bite arm is red  HPI: 62 yo female has a new puppy about 57 months of age who bite her rt forearm about 3 days ago and is red and swollen over last 24 hours.  Has one puncture wound that has been draining pus.  Saw surgery as outpt today told to go to PCP to have admission arranged for iv abx.  No fever.  No n/v.  No frequent infections.    Review of Systems:  Positive and negative as per HPI otherwise all other systems are negative  Past Medical History: Past Medical History  Diagnosis Date  . GERD (gastroesophageal reflux disease)   . Hypercholesterolemia   . Seasonal allergies   . Tobacco abuse   . Cataract   . Aortic valve stenosis, moderate    Past Surgical History  Procedure Laterality Date  . Wrist surgery  1974  . Tonsillectomy  age 45  . Mouth surgery    . Video bronchoscopy  09/12/2011    Procedure: VIDEO BRONCHOSCOPY WITH FLUORO;  Surgeon: Nyoka Cowden, MD;  Location: [facility];  Service: Cardiopulmonary;  Laterality: Bilateral;    Medications: Prior to Admission medications   Medication Sig Start Date End Date Taking? Authorizing Provider  albuterol (PROVENTIL HFA) 108 (90 BASE) MCG/ACT inhaler Inhale 2 puffs into the lungs every 6 (six) hours as needed for shortness of breath.    Yes Historical Provider, MD  Coenzyme Q10 (CO Q 10) 100 MG CAPS Take 1 capsule by mouth daily.   Yes Historical Provider, MD  lansoprazole (PREVACID) 30 MG capsule Take 30 mg by mouth daily.   Yes Historical Provider, MD  Misc Natural Products (BLACK COHOSH MENOPAUSE COMPLEX) TABS Take 1 tablet by mouth daily.   Yes Historical Provider, MD  rosuvastatin (CRESTOR) 10 MG tablet Take 10 mg by mouth every Monday, Wednesday, and Friday. 1 tablet at bedtime three times per wk   Yes Historical Provider, MD    Allergies:   Allergies  Allergen Reactions  . Azithromycin Shortness Of Breath  . Ceclor (Cefaclor) Shortness Of Breath   . Doxycycline Other (See Comments)    Redness on the face  . Levaquin (Levofloxacin In D5w) Swelling  . Penicillins Other (See Comments)    Unknown from childhood  . Septra (Sulfamethoxazole W-Trimethoprim) Rash    Social History:  reports that she has been smoking Cigarettes.  She has a 15 pack-year smoking history. She has never used smokeless tobacco. She reports that she does not drink alcohol or use illicit drugs.  Family History: Family History  Problem Relation Age of Onset  . Alcohol abuse Father     Physical Exam: Filed Vitals:   06/23/12 1727 06/23/12 1747 06/23/12 1943  BP: 122/44 122/84 102/56  Pulse: 120 122 108  Temp: 103 F (39.4 C) 100.2 F (37.9 C) 99.9 F (37.7 C)  TempSrc: Oral Oral Oral  Resp: 16 16   Height:  4' 11.5" (1.511 m)   Weight:  55.339 kg (122 lb)   SpO2: 94% 94% 92%   General appearance: alert, cooperative and no distress Head: Normocephalic, without obvious abnormality, atraumatic Eyes: negative Nose: Nares normal. Septum midline. Mucosa normal. No drainage or sinus tenderness. Neck: no JVD and supple, symmetrical, trachea midline Lungs: clear to auscultation bilaterally Heart: regular rate and rhythm, S1, S2 normal, no murmur, click, rub or gallop Abdomen: soft, non-tender; bowel sounds normal; no masses,  no organomegaly  Extremities: extremities normal, atraumatic, no cyanosis or edema Pulses: 2+ and symmetric Skin: Skin color, texture, turgor normal. Cellulitis to rt forearm with single puncture wound with small amt of discharge, no flunctuance some induration no necrosis Neurologic: Grossly normal    Labs on Admission:   Recent Labs  06/23/12 1800  NA 132*  K 3.7  CL 96  CO2 25  GLUCOSE 133*  BUN 12  CREATININE 0.60  CALCIUM 9.3    Recent Labs  06/23/12 1800  AST 22  ALT 13  ALKPHOS 63  BILITOT 0.5  PROT 7.3  ALBUMIN 3.5    Recent Labs  06/23/12 1800  WBC 15.1*  NEUTROABS 12.8*  HGB 15.0  HCT 42.7   MCV 89.5  PLT 198   Radiological Exams on Admission: No results found.  Assessment/Plan 62 yo female with rue cellulitis from dog bite  Principal Problem:   Cellulitis Active Problems:   Aortic stenosis   COPD (chronic obstructive pulmonary disease)   Dog bite of forearm  Iv clinda.  Tetanus up to date.  No absess present.  Area  Marked out by edp.    Gracelynne Benedict A 06/23/2012, 10:01 PM

## 2012-06-24 ENCOUNTER — Encounter (HOSPITAL_COMMUNITY): Payer: Self-pay | Admitting: Orthopedic Surgery

## 2012-06-24 DIAGNOSIS — R918 Other nonspecific abnormal finding of lung field: Secondary | ICD-10-CM

## 2012-06-24 DIAGNOSIS — S41109A Unspecified open wound of unspecified upper arm, initial encounter: Secondary | ICD-10-CM

## 2012-06-24 DIAGNOSIS — J449 Chronic obstructive pulmonary disease, unspecified: Secondary | ICD-10-CM

## 2012-06-24 DIAGNOSIS — IMO0002 Reserved for concepts with insufficient information to code with codable children: Secondary | ICD-10-CM

## 2012-06-24 DIAGNOSIS — L0291 Cutaneous abscess, unspecified: Secondary | ICD-10-CM

## 2012-06-24 DIAGNOSIS — S51809A Unspecified open wound of unspecified forearm, initial encounter: Secondary | ICD-10-CM

## 2012-06-24 DIAGNOSIS — I359 Nonrheumatic aortic valve disorder, unspecified: Secondary | ICD-10-CM

## 2012-06-24 DIAGNOSIS — W540XXA Bitten by dog, initial encounter: Secondary | ICD-10-CM

## 2012-06-24 LAB — CBC WITH DIFFERENTIAL/PLATELET
Basophils Absolute: 0 10*3/uL (ref 0.0–0.1)
Basophils Relative: 0 % (ref 0–1)
Eosinophils Absolute: 0.1 10*3/uL (ref 0.0–0.7)
Eosinophils Relative: 0 % (ref 0–5)
HCT: 33.3 % — ABNORMAL LOW (ref 36.0–46.0)
Hemoglobin: 11.3 g/dL — ABNORMAL LOW (ref 12.0–15.0)
Lymphocytes Relative: 13 % (ref 12–46)
Lymphs Abs: 1.8 10*3/uL (ref 0.7–4.0)
MCH: 30.5 pg (ref 26.0–34.0)
MCHC: 33.9 g/dL (ref 30.0–36.0)
MCV: 90 fL (ref 78.0–100.0)
Monocytes Absolute: 0.7 10*3/uL (ref 0.1–1.0)
Monocytes Relative: 5 % (ref 3–12)
Neutro Abs: 10.8 10*3/uL — ABNORMAL HIGH (ref 1.7–7.7)
Neutrophils Relative %: 81 % — ABNORMAL HIGH (ref 43–77)
Platelets: 157 10*3/uL (ref 150–400)
RBC: 3.7 MIL/uL — ABNORMAL LOW (ref 3.87–5.11)
RDW: 13.4 % (ref 11.5–15.5)
WBC: 13.4 10*3/uL — ABNORMAL HIGH (ref 4.0–10.5)

## 2012-06-24 LAB — BASIC METABOLIC PANEL
CO2: 22 mEq/L (ref 19–32)
Chloride: 104 mEq/L (ref 96–112)
Creatinine, Ser: 0.56 mg/dL (ref 0.50–1.10)
Glucose, Bld: 105 mg/dL — ABNORMAL HIGH (ref 70–99)
Sodium: 134 mEq/L — ABNORMAL LOW (ref 135–145)

## 2012-06-24 LAB — CBC
Hemoglobin: 9.2 g/dL — ABNORMAL LOW (ref 12.0–15.0)
MCV: 89.3 fL (ref 78.0–100.0)
Platelets: 123 10*3/uL — ABNORMAL LOW (ref 150–400)
RBC: 2.99 MIL/uL — ABNORMAL LOW (ref 3.87–5.11)
WBC: 9.8 10*3/uL (ref 4.0–10.5)

## 2012-06-24 MED ORDER — ALBUTEROL SULFATE HFA 108 (90 BASE) MCG/ACT IN AERS
2.0000 | INHALATION_SPRAY | Freq: Four times a day (QID) | RESPIRATORY_TRACT | Status: DC | PRN
  Filled 2012-06-24: qty 6.7

## 2012-06-24 MED ORDER — ONDANSETRON HCL 4 MG PO TABS
4.0000 mg | ORAL_TABLET | Freq: Four times a day (QID) | ORAL | Status: DC | PRN
  Administered 2012-06-25: 4 mg via ORAL
  Filled 2012-06-24: qty 1

## 2012-06-24 MED ORDER — HYDROCODONE-ACETAMINOPHEN 5-325 MG PO TABS
1.0000 | ORAL_TABLET | ORAL | Status: DC | PRN
  Administered 2012-06-24 – 2012-06-25 (×5): 2 via ORAL
  Administered 2012-06-25: 1 via ORAL
  Filled 2012-06-24 (×5): qty 2
  Filled 2012-06-24: qty 1
  Filled 2012-06-24: qty 2

## 2012-06-24 MED ORDER — SODIUM CHLORIDE 0.9 % IV SOLN: INTRAVENOUS | Status: AC

## 2012-06-24 MED ORDER — CLINDAMYCIN PHOSPHATE 600 MG/50ML IV SOLN
600.0000 mg | Freq: Three times a day (TID) | INTRAVENOUS | Status: DC
  Administered 2012-06-24 – 2012-06-26 (×6): 600 mg via INTRAVENOUS
  Filled 2012-06-24 (×9): qty 50

## 2012-06-24 MED ORDER — ENOXAPARIN SODIUM 40 MG/0.4ML ~~LOC~~ SOLN
40.0000 mg | SUBCUTANEOUS | Status: DC
  Administered 2012-06-24 – 2012-06-29 (×5): 40 mg via SUBCUTANEOUS
  Filled 2012-06-24 (×7): qty 0.4

## 2012-06-24 MED ORDER — ONDANSETRON HCL 4 MG/2ML IJ SOLN
4.0000 mg | Freq: Four times a day (QID) | INTRAMUSCULAR | Status: DC | PRN
  Administered 2012-06-25: 4 mg via INTRAVENOUS
  Filled 2012-06-24 (×2): qty 2

## 2012-06-24 MED ORDER — PANTOPRAZOLE SODIUM 40 MG PO TBEC
40.0000 mg | DELAYED_RELEASE_TABLET | Freq: Two times a day (BID) | ORAL | Status: DC
  Administered 2012-06-24 – 2012-06-29 (×11): 40 mg via ORAL
  Filled 2012-06-24 (×11): qty 1

## 2012-06-24 MED ORDER — SODIUM CHLORIDE 0.9 % IV SOLN
INTRAVENOUS | Status: AC
Start: 2012-06-24 — End: 2012-06-24
  Administered 2012-06-24: 02:00:00 via INTRAVENOUS

## 2012-06-24 MED ORDER — LEVOFLOXACIN IN D5W 750 MG/150ML IV SOLN
750.0000 mg | INTRAVENOUS | Status: DC
  Administered 2012-06-24 – 2012-06-25 (×2): 750 mg via INTRAVENOUS
  Filled 2012-06-24 (×3): qty 150

## 2012-06-24 NOTE — Progress Notes (Signed)
UR COMPLETED  

## 2012-06-24 NOTE — ED Notes (Signed)
Pt resting in bed with family at bedside.  Pt inquired as to when Admitting MD would be in her room to see her, pt made aware of admitting process.

## 2012-06-24 NOTE — Progress Notes (Signed)
Pt arrived on unit via stretcher from ED 4/22 and 0140. Assisted to BR, voiding with no problems. Erythema marked on R FA, no drainage noted though pt stated it had drainage earlier in the day. No dsg at this time. Pt stated it is not painful unless touched. Vitals- 99.9, HR 119, R 20, 113/88, sats 94 on RA. Bilateral wheezing noted in lungs, pt states this is her norm as she is a smoker. Assisted pt into bed, resting comfortably. Oriented pt and husband to room. Call bell within reach. Plan of care reviewed with pt and husband. Will continue to monitor.

## 2012-06-24 NOTE — Progress Notes (Signed)
Assessment/Plan: Principal Problem:   Cellulitis - this is improved on clinda only. I am not satisfied with this limited spectrum antibiotic. I will ask ID for recommendations. Based on allergies reported (see Subjective), I would be comfortable with quinolone or doxy if needed. Also ask Surgery to follow along (appreciate Dr. Dwain Sarna seeing her in office yesterday!). We did a culture in our office yesterday morning. It will be out in 1-2 days.  Active Problems:   Aortic stenosis   COPD (chronic obstructive pulmonary disease)   Dog bite of forearm  Drop in Hb and low K is likely due to lab draw near IV. This is being repeated in  30 minutes.   Subjective: She is feeling better. Area of drainage is better. Area of redness is diminished. Pain meds helping.   I reviewed her allergies: 1. Levaquin - taken many times; last time got "esophagus inflammation" 2. PCN - in infancy - "almost died" 3. Ceclor - dyspnea 4. Azithro - dyspnea 5. Septra - total body rash 6. Doxy - rash on face (like mask)  Objective:  Vital Signs: Filed Vitals:   06/24/12 0145 06/24/12 0330 06/24/12 0504 06/24/12 0645  BP: 113/88  102/49 100/61  Pulse: 119  105 104  Temp: 99.9 F (37.7 C) 100.3 F (37.9 C) 102.7 F (39.3 C) 100.1 F (37.8 C)  TempSrc:      Resp: 20  20   Height:      Weight:      SpO2: 94%  92%      EXAM: Erythema clearly diminished from line drawn yesterday. Still some drainage from punctate lesion.    Intake/Output Summary (Last 24 hours) at 06/24/12 0751 Last data filed at 06/24/12 0659  Gross per 24 hour  Intake    480 ml  Output      0 ml  Net    480 ml    Lab Results:  Recent Labs  06/23/12 1800  NA 132*  K 3.7  CL 96  CO2 25  GLUCOSE 133*  BUN 12  CREATININE 0.60  CALCIUM 9.3    Recent Labs  06/23/12 1800  AST 22  ALT 13  ALKPHOS 63  BILITOT 0.5  PROT 7.3  ALBUMIN 3.5   No results found for this basename: LIPASE, AMYLASE,  in the last 72  hours  Recent Labs  06/23/12 1800 06/24/12 0540  WBC 15.1* 9.8  NEUTROABS 12.8*  --   HGB 15.0 9.2*  HCT 42.7 26.7*  MCV 89.5 89.3  PLT 198 123*   No results found for this basename: CKTOTAL, CKMB, CKMBINDEX, TROPONINI,  in the last 72 hours No components found with this basename: POCBNP,  No results found for this basename: DDIMER,  in the last 72 hours No results found for this basename: HGBA1C,  in the last 72 hours No results found for this basename: CHOL, HDL, LDLCALC, TRIG, CHOLHDL, LDLDIRECT,  in the last 72 hours No results found for this basename: TSH, T4TOTAL, FREET3, T3FREE, THYROIDAB,  in the last 72 hours No results found for this basename: VITAMINB12, FOLATE, FERRITIN, TIBC, IRON, RETICCTPCT,  in the last 72 hours  Studies/Results: No results found. Medications: Medications administered in the last 24 hours reviewed.  Current Medication List reviewed.    LOS: 1 day   [facility] [phone]) 06/24/2012, 7:51 AM

## 2012-06-24 NOTE — ED Notes (Signed)
Dr David in room with pt  

## 2012-06-24 NOTE — Progress Notes (Signed)
HPI: 62 year old female with a history of tobacco use, hyperlipidemia, AVS and GERD who initially presented to her PCPs office yesterday morning following a dog bite.  According to the patient this occurred on Thursday night, worsened "Sunday evening.  She was subsequently sent to surgery, Dr. Wakefield who felt admission was necessary for IV antibiotics.  Location is right anterior arm, distal aspect.  She complains of tenderness at site.  The puncture site spontaneously began draining while at Dr. Wakefield's office.  She reports having fever, chills and sweats.  She denies chest pains or palpitations.  She reports shortness of breath.  Pt. Is a 1/2ppd smoker since age 21.  She denies alcohol use.  States her and dog are up to date on immunization.   Objective: Vital signs in last 24 hours: Temp:  [98.4 F (36.9 C)-103 F (39.4 C)] 100.1 F (37.8 C) (04/22 0645) Pulse Rate:  [95-126] 104 (04/22 0645) Resp:  [16-20] 20 (04/22 0504) BP: (90-122)/(44-88) 100/61 mmHg (04/22 0645) SpO2:  [89 %-95 %] 92 % (04/22 0504) Weight:  [122 lb (55.339 kg)-124 lb 9.6 oz (56.518 kg)] 122 lb (55.339 kg) (04/21 1747) Last BM Date: 06/23/12  Intake/Output from previous day: 04/21 0701 - 04/22 0700 In: 480 [P.O.:480] Out: -  Intake/Output this shift:    General appearance: alert, cooperative and no distress Head: Normocephalic, without obvious abnormality, atraumatic Throat: lips, mucosa, and tongue normal; teeth and gums normal Neck: no adenopathy, supple, symmetrical, trachea midline and thyroid not enlarged, symmetric, no tenderness/mass/nodules Resp: scattered inspiratory wheezes, no crackles.  no tachypnea or chest wall tenderness Cardio: S1S2 RRR 2/6 SEM best appreciated right sternal border 2nd ICS.  No edema, +2 distal pulses GI: soft, non-tender; bowel sounds normal; no masses,  no organomegaly Extremities: extremities normal, atraumatic, no cyanosis or edema, no edema, redness or tenderness in  the calves or thighs and right distal arm-erythema, induration most notable on anterior aspect of right arm, small vesicles superior to puncture site.  Puncture site draining cloudy drainage, no fluctuance or sign of an abscess Pulses: 2+ and symmetric Skin: Skin color, texture, turgor normal. No rashes or lesions Neurologic: Alert and oriented X 3, normal strength and tone. Normal symmetric reflexes. Normal coordination and gait  Lab Results:   Recent Labs  06/24/12 0540 06/24/12 0817  WBC 9.8 13.4*  HGB 9.2* 11.3*  HCT 26.7* 33.3*  PLT 123* 157   BMET  Recent Labs  06/23/12 1800 06/24/12 0816  NA 132* 134*  K 3.7 3.5  CL 96 104  CO2 25 22  GLUCOSE 133* 105*  BUN 12 11  CREATININE 0.60 0.56  CALCIUM 9.3 8.2*   PT/INR No results found for this basename: LABPROT, INR,  in the last 72 hours ABG No results found for this basename: PHART, PCO2, PO2, HCO3,  in the last 72 hours  Studies/Results: No results found.  Anti-infectives: Anti-infectives   Start     Dose/Rate Route Frequency Ordered Stop   06/24/12 0200  clindamycin (CLEOCIN) IVPB 600 mg     600 mg 100 mL/hr over 30 Minutes Intravenous 3 times per day 06/24/12 0141     06/23/12 2115  clindamycin (CLEOCIN) IVPB 600 mg     60" 0 mg 100 mL/hr over 30 Minutes Intravenous  Once 06/23/12 2104 06/23/12 2236      Assessment/Plan: Cellulitis of Rt arm following dog bite  -appears to have improved with clindamycin. -would benefit from additional atbx.  Recommend levaquin, however, will  discuss with Dr. Lindie Spruce before initiating.  Levquin does not appear to be a true allergy and the pt stated it gives her reflux.  She has taken levquin many times and simply increases her pepcid while take.  Will add PPI and prn benadryl. -Culture done in PCPs office -ambulate/SCDs -Pain control -will continue to follow   LOS: 1 day    Mekaylah Klich ANP-BC 06/24/2012

## 2012-06-24 NOTE — Progress Notes (Signed)
Arm is by report better  Teresa [name]. [name], MD, FACS [phone] [facility]

## 2012-06-25 ENCOUNTER — Inpatient Hospital Stay (HOSPITAL_COMMUNITY): Payer: BC Managed Care – PPO

## 2012-06-25 LAB — CBC
HCT: 35.9 % — ABNORMAL LOW (ref 36.0–46.0)
MCH: 30 pg (ref 26.0–34.0)
MCHC: 33.1 g/dL (ref 30.0–36.0)
MCV: 90.4 fL (ref 78.0–100.0)
RDW: 13.4 % (ref 11.5–15.5)

## 2012-06-25 MED ORDER — ALBUTEROL SULFATE (5 MG/ML) 0.5% IN NEBU
2.5000 mg | INHALATION_SOLUTION | Freq: Three times a day (TID) | RESPIRATORY_TRACT | Status: DC
  Administered 2012-06-25 – 2012-06-29 (×10): 2.5 mg via RESPIRATORY_TRACT
  Filled 2012-06-25 (×11): qty 0.5

## 2012-06-25 MED ORDER — ALBUTEROL SULFATE (5 MG/ML) 0.5% IN NEBU
2.5000 mg | INHALATION_SOLUTION | Freq: Once | RESPIRATORY_TRACT | Status: AC
  Administered 2012-06-25: 2.5 mg via RESPIRATORY_TRACT

## 2012-06-25 MED ORDER — ALBUTEROL SULFATE (5 MG/ML) 0.5% IN NEBU
INHALATION_SOLUTION | RESPIRATORY_TRACT | Status: AC
  Filled 2012-06-25: qty 0.5

## 2012-06-25 MED ORDER — MORPHINE SULFATE 2 MG/ML IJ SOLN
2.0000 mg | Freq: Four times a day (QID) | INTRAMUSCULAR | Status: DC | PRN
  Administered 2012-06-25 – 2012-06-26 (×2): 2 mg via INTRAVENOUS
  Filled 2012-06-25 (×3): qty 1

## 2012-06-25 MED ORDER — BUDESONIDE-FORMOTEROL FUMARATE 80-4.5 MCG/ACT IN AERO
2.0000 | INHALATION_SPRAY | Freq: Two times a day (BID) | RESPIRATORY_TRACT | Status: DC
  Administered 2012-06-25 – 2012-06-29 (×8): 2 via RESPIRATORY_TRACT
  Filled 2012-06-25: qty 6.9

## 2012-06-25 MED ORDER — MAGNESIUM HYDROXIDE 400 MG/5ML PO SUSP
30.0000 mL | Freq: Every day | ORAL | Status: DC | PRN
  Administered 2012-06-25: 30 mL via ORAL
  Filled 2012-06-25: qty 30

## 2012-06-25 MED ORDER — DOCUSATE SODIUM 100 MG PO CAPS
100.0000 mg | ORAL_CAPSULE | Freq: Two times a day (BID) | ORAL | Status: DC
  Administered 2012-06-25 – 2012-06-28 (×8): 100 mg via ORAL
  Filled 2012-06-25 (×11): qty 1

## 2012-06-25 MED ORDER — ONDANSETRON HCL 4 MG/2ML IJ SOLN
4.0000 mg | Freq: Once | INTRAMUSCULAR | Status: AC
  Administered 2012-06-25: 4 mg via INTRAVENOUS

## 2012-06-25 NOTE — Progress Notes (Signed)
Improved but still needs IV antibiotics.  If pus starts to drain more may need to be opened and debrided.  Marta Lamas. [name], MD, FACS [phone] [facility]

## 2012-06-25 NOTE — Progress Notes (Signed)
Patient has been complaining of nausea. Medication ordered and given.  Patient has not been eating, no appetite. Patient has had 3 emesis since noon. MD notified.

## 2012-06-25 NOTE — Progress Notes (Signed)
  Subjective: Pt sitting up in bed, husband at bedside.  Complains of nausea today.  No vomiting.  Reports her pain is a little better today, attributing nausea to the pain medication.  Reports dyspnea has improved.  Denies chest pains or palpitations.  Voiding without any difficulties.  Pt out of bed to go to restroom, otherwise little mobility  Objective: Vital signs in last 24 hours: Temp:  [98.4 F (36.9 C)-103.1 F (39.5 C)] 98.4 F (36.9 C) (04/23 1610) Pulse Rate:  [65-109] 68 (04/23 0632) Resp:  [16-20] 16 (04/23 9604) BP: (102-107)/(51-58) 107/58 mmHg (04/23 5409) SpO2:  [93 %-95 %] 95 % (04/23 8119) Last BM Date: 06/23/12  Intake/Output from previous day: 04/22 0701 - 04/23 0700 In: 721 [P.O.:481; I.V.:240] Out: -  Intake/Output this shift:    General appearance: alert, cooperative and no distress  Head: Normocephalic, without obvious abnormality, atraumatic  Throat: lips, mucosa, and tongue normal; teeth and gums normal  Neck: no adenopathy, supple, symmetrical, trachea midline and thyroid not enlarged, symmetric, no tenderness/mass/nodules  Resp: few inspiratory wheezes upon auscultation of anterior lung fields, clear posteriorly.  No crackles. no tachypnea or chest wall tenderness  Cardio: S1S2 RRR 2/6 SEM best appreciated right sternal border 2nd ICS. No edema, +2 distal pulses  GI: soft, non-tender; bowel sounds normal; no masses, no organomegaly  Extremities: extremities normal, atraumatic, no cyanosis or edema, no edema, redness or tenderness in the calves or thighs and right distal arm-erythema, induration most notable on anterior aspect of right arm, small vesicles superior to puncture site.  Erythema has improved today.  Puncture site draining minimal cloudy drainage, no fluctuance or sign of an abscess  Pulses: 2+ and symmetric  Skin: Skin color, texture, turgor normal. No rashes or lesions  Neurologic: Alert and oriented X 3, normal strength and tone. Normal  symmetric reflexes. Normal coordination and gait   Lab Results:   Recent Labs  06/24/12 0817 06/25/12 0605  WBC 13.4* 13.7*  HGB 11.3* 11.9*  HCT 33.3* 35.9*  PLT 157 148*   BMET  Recent Labs  06/23/12 1800 06/24/12 0816  NA 132* 134*  K 3.7 3.5  CL 96 104  CO2 25 22  GLUCOSE 133* 105*  BUN 12 11  CREATININE 0.60 0.56  CALCIUM 9.3 8.2*    Studies/Results: No results found.  Anti-infectives: Anti-infectives   Start     Dose/Rate Route Frequency Ordered Stop   06/24/12 1200  levofloxacin (LEVAQUIN) IVPB 750 mg    Comments:  Not a true allergy, causes reflux   750 mg 100 mL/hr over 90 Minutes Intravenous Every 24 hours 06/24/12 1123     06/24/12 0200  clindamycin (CLEOCIN) IVPB 600 mg     600 mg 100 mL/hr over 30 Minutes Intravenous 3 times per day 06/24/12 0141     06/23/12 2115  clindamycin (CLEOCIN) IVPB 600 mg     600 mg 100 mL/hr over 30 Minutes Intravenous  Once 06/23/12 2104 06/23/12 2236      Assessment/Plan: Cellulitis of Rt arm following dog bite  -improving.  Continue with clindamycin and levaquin IV for at least 1 more day.  Would like pt to be afebrile for at least 24 hours.If fevers continue, may need local exploration. -Encouraged pt to ambulate -SCD/lovenox -Pain control  -Culture done in PCPs office on 4/22   LOS: 2 days    Jehieli Brassell ANP-BC 06/25/2012

## 2012-06-25 NOTE — Progress Notes (Signed)
Assessment/Plan: Principal Problem:   Cellulitis - continued improvement. Plan at least one more day of IV abx if not more. WBC improving.  Active Problems:   Aortic stenosis   COPD (chronic obstructive pulmonary disease)   Dog bite of forearm   Subjective: She is having a less pain. Mild nausea but no vomiting. Not much in the way of heartburn. She feels constipated.   Objective:  Vital Signs: Filed Vitals:   06/24/12 1300 06/24/12 2037 06/25/12 0003 06/25/12 0632  BP: 102/51 103/53  107/58  Pulse: 109 65  68  Temp: 103.1 F (39.5 C) 101.2 F (38.4 C) 99.5 F (37.5 C) 98.4 F (36.9 C)  TempSrc:  Oral  Oral  Resp: 20 18  16   Height:      Weight:      SpO2: 93% 93%  95%     EXAM: Area of redness is smaller. The redness is more intense. She notes that this comes and goes. She has no drainage that I can express from puncture wound.    Intake/Output Summary (Last 24 hours) at 06/25/12 0741 Last data filed at 06/25/12 0656  Gross per 24 hour  Intake    721 ml  Output      0 ml  Net    721 ml    Lab Results:  Recent Labs  06/23/12 1800 06/24/12 0816  NA 132* 134*  K 3.7 3.5  CL 96 104  CO2 25 22  GLUCOSE 133* 105*  BUN 12 11  CREATININE 0.60 0.56  CALCIUM 9.3 8.2*    Recent Labs  06/23/12 1800  AST 22  ALT 13  ALKPHOS 63  BILITOT 0.5  PROT 7.3  ALBUMIN 3.5   No results found for this basename: LIPASE, AMYLASE,  in the last 72 hours  Recent Labs  06/23/12 1800  06/24/12 0817 06/25/12 0605  WBC 15.1*  < > 13.4* 13.7*  NEUTROABS 12.8*  --  10.8*  --   HGB 15.0  < > 11.3* 11.9*  HCT 42.7  < > 33.3* 35.9*  MCV 89.5  < > 90.0 90.4  PLT 198  < > 157 148*  < > = values in this interval not displayed. No results found for this basename: CKTOTAL, CKMB, CKMBINDEX, TROPONINI,  in the last 72 hours No components found with this basename: POCBNP,  No results found for this basename: DDIMER,  in the last 72 hours No results found for this basename:  HGBA1C,  in the last 72 hours No results found for this basename: CHOL, HDL, LDLCALC, TRIG, CHOLHDL, LDLDIRECT,  in the last 72 hours No results found for this basename: TSH, T4TOTAL, FREET3, T3FREE, THYROIDAB,  in the last 72 hours No results found for this basename: VITAMINB12, FOLATE, FERRITIN, TIBC, IRON, RETICCTPCT,  in the last 72 hours  Studies/Results: No results found. Medications: Medications administered in the last 24 hours reviewed.  Current Medication List reviewed.    LOS: 2 days   [facility] @ [facility] [phone]) 06/25/2012, 7:41 AM

## 2012-06-26 ENCOUNTER — Inpatient Hospital Stay (HOSPITAL_COMMUNITY): Payer: BC Managed Care – PPO

## 2012-06-26 DIAGNOSIS — L0889 Other specified local infections of the skin and subcutaneous tissue: Secondary | ICD-10-CM

## 2012-06-26 DIAGNOSIS — J189 Pneumonia, unspecified organism: Secondary | ICD-10-CM | POA: Diagnosis not present

## 2012-06-26 LAB — CBC WITH DIFFERENTIAL/PLATELET
Lymphocytes Relative: 13 % (ref 12–46)
Lymphs Abs: 1.1 10*3/uL (ref 0.7–4.0)
MCV: 88.8 fL (ref 78.0–100.0)
Neutrophils Relative %: 80 % — ABNORMAL HIGH (ref 43–77)
Platelets: 164 10*3/uL (ref 150–400)
RBC: 3.48 MIL/uL — ABNORMAL LOW (ref 3.87–5.11)
WBC: 8.8 10*3/uL (ref 4.0–10.5)

## 2012-06-26 MED ORDER — VANCOMYCIN HCL 500 MG IV SOLR
500.0000 mg | Freq: Two times a day (BID) | INTRAVENOUS | Status: DC
  Administered 2012-06-26 – 2012-06-28 (×5): 500 mg via INTRAVENOUS
  Filled 2012-06-26 (×7): qty 500

## 2012-06-26 MED ORDER — LEVOFLOXACIN 750 MG PO TABS
750.0000 mg | ORAL_TABLET | Freq: Every day | ORAL | Status: DC
  Administered 2012-06-26 – 2012-06-29 (×4): 750 mg via ORAL
  Filled 2012-06-26 (×4): qty 1

## 2012-06-26 MED ORDER — GADOBENATE DIMEGLUMINE 529 MG/ML IV SOLN
10.0000 mL | Freq: Once | INTRAVENOUS | Status: AC
  Administered 2012-06-26: 10 mL via INTRAVENOUS

## 2012-06-26 NOTE — Consult Note (Signed)
[facility]    Date of Admission:  06/23/2012  Date of Consult:  06/26/2012  Reason for Consult:dog bite with cellulitis soft tissue infection and RUL infiltrate Referring Physician: Dr. Earl Gala   HPI: Teresa [name] is an 62 y.o. female PMHX sig for AV stenosis, COPD, RUL nodule that has been followed by Dr Sherene Sires in [facility] who has been admitted after sustaining a dog bite from a puppy to her forearm. ARea became red and swollen over 24 hours. Hadone puncture wound that has been draining pus. This was cultured in Dr Newell Coral office and she was admitted to [facility] and started on IV clindamycin and then levofloxacin. She has been followed closely by [facility]. In the interval she is at her a chest x-ray performed which has shown a right upper lobe infiltrate appears increased prior to any nodule that was seen there previously on CT scan last imaged in November of 2013.   Patient still had fevers up over 101 yesterday. The edema in her forearm continues to be within the boundaries of the line that was drawn but she has tenderness that is now extending up more proximally into her right arm.   To her pulmonary symptoms she has had some wheezing and cough that has been nonproductive.  Her RUL nodule has been worked up and observe by Pulmonary adn she has had Bronchosopy with BAL with AFB culture in 09/2011 negative.  I spent greater than 60 minutes with the patient including greater than 50% of time in face to face counsel of the patient and in coordination of their care.     Past Medical History  Diagnosis Date  . GERD (gastroesophageal reflux disease)   . Hypercholesterolemia   . Seasonal allergies   . Tobacco abuse   . Cataract     "? right" (06/24/2012)  . Aortic valve stenosis, moderate   . Heart murmur   . Mass of lung     "right spot; they are watching it" (06/24/2012)  . Cellulitis 06/20/2012    "dog bite; right forearm" (06/24/2012)   . Arthritis     "across my hips; comes w/the weather" (06/24/2012)    Past Surgical History  Procedure Laterality Date  . Mouth surgery  2010?    "for bone loss" (06/24/2012)  . Video bronchoscopy  09/12/2011    Procedure: VIDEO BRONCHOSCOPY WITH FLUORO;  Surgeon: Nyoka Cowden, MD;  Location: [facility];  Service: Cardiopulmonary;  Laterality: Bilateral;  . Tonsillectomy  1960  . Ganglion cyst excision  1975  ergies:   Allergies  Allergen Reactions  . Azithromycin Shortness Of Breath  . Ceclor (Cefaclor) Shortness Of Breath  . Doxycycline Other (See Comments)    Redness on the face  . Penicillins Other (See Comments)    Unknown from childhood  . Septra (Sulfamethoxazole W-Trimethoprim) Rash     Medications: I have reviewed patients current medications as documented in Epic Anti-infectives   Start     Dose/Rate Route Frequency Ordered Stop   06/26/12 1000  levofloxacin (LEVAQUIN) tablet 750 mg     750 mg Oral Daily 06/26/12 0901     06/24/12 1200  levofloxacin (LEVAQUIN) IVPB 750 mg  Status:  Discontinued    Comments:  Not a true allergy, causes reflux   750 mg 100 mL/hr over 90 Minutes Intravenous Every 24 hours 06/24/12 1123 06/26/12 0901   06/24/12 0200  clindamycin (CLEOCIN) IVPB 600 mg  Status:  Discontinued  600 mg 100 mL/hr over 30 Minutes Intravenous 3 times per day 06/24/12 0141 06/26/12 0901   06/23/12 2115  clindamycin (CLEOCIN) IVPB 600 mg     600 mg 100 mL/hr over 30 Minutes Intravenous  Once 06/23/12 2104 06/23/12 2236      Social History:  reports that she has been smoking Cigarettes.  She has a 20 pack-year smoking history. She has never used smokeless tobacco. She reports that she does not drink alcohol or use illicit drugs.  Family History  Problem Relation Age of Onset  . Alcohol abuse Father     As in HPI and primary teams notes otherwise 12 point review of systems is negative  Blood pressure 110/61, pulse 103, temperature 98 F (36.7 C),  temperature source Oral, resp. rate 16, height 4' 11.5" (1.511 m), weight 122 lb (55.339 kg), last menstrual period 10/04/1999, SpO2 97.00%. General: Alert and awake, oriented x3, not in any acute distress. HEENT: anicteric sclera, pupils reactive to light and accommodation, EOMI, oropharynx clear and without exudate CVS regular rate, normal r,  no murmur rubs or gallops Chest: wheezing, rales or rhonchi Abdomen: soft nontender, nondistended, normal bowel sounds, Extremities:skin: right foreram with intense erythema and thickened skin, tender, she also has tenderness proximally involving her upper arm but no overlying erythema Neuro: nonfocal, strength and sensation intact   Results for orders placed during the hospital encounter of 06/23/12 (from the past 48 hour(s))  CBC     Status: Abnormal   Collection Time    06/25/12  6:05 AM      Result Value Range   WBC 13.7 (*) 4.0 - 10.5 K/uL   RBC 3.97  3.87 - 5.11 MIL/uL   Hemoglobin 11.9 (*) 12.0 - 15.0 g/dL   HCT 16.1 (*) 09.6 - 04.5 %   MCV 90.4  78.0 - 100.0 fL   MCH 30.0  26.0 - 34.0 pg   MCHC 33.1  30.0 - 36.0 g/dL   RDW 40.9  81.1 - 91.4 %   Platelets 148 (*) 150 - 400 K/uL      Component Value Date/Time   SDES BRONCHIAL ALVEOLAR LAVAGE RUL 09/12/2011 0840   SPECREQUEST NONE 09/12/2011 0840   CULT NO ACID FAST BACILLI ISOLATED IN 6 WEEKS 09/12/2011 0840   REPTSTATUS 10/25/2011 FINAL 09/12/2011 0840   Dg Chest 1 View  06/25/2012  *RADIOLOGY REPORT*  Clinical Data: 62 year old female shortness of breath and wheezing.  CHEST - 1 VIEW  Comparison: Chest CT 02/01/2012 and earlier.  Findings: Upper lobe nodular and confluent airspace disease superimposed on chronic scarring on the right.  Lung bases appear stable.  Cardiac size and mediastinal contours are within normal limits.  Visualized tracheal air column is within normal limits. No pneumothorax or definite effusion.  IMPRESSION: Right upper lobe pneumonia superimposed on chronic lung  disease. Recommend post-treatment radiographs to document resolution.   Original Report Authenticated By: Erskine Speed, M.D.      No results found for this or any previous visit (from the past 720 hour(s)).   Impression/Recommendation  53  Year old with dog bite to forearm and soft tissue infection, cellulitis and possible abscess with mx allergies including to PCN, Ceph--> difficulty breathing, also with RUL infiltrate increased vs prior CT  #1 Dog bite with soft tissue infection: We definitely need to cover Pasteurella (levaquin would be active) as welll as MRSA< MSSA, and GAS. Clindamycin might be decent choice but I worry a) about not covering MRSA well  and b) about risk for C difficile with clinda and a FQ  --I will change her to IV vancomycin and oral levaquin (highly bioavailable) --I  DO think she also needs imaging of her forearm and upper arm and will order MRI --[facility] is following closely --fu cultures from Dr. Karma Greaser office  #2 RUL infiltrate: --get CT w contrast tomorrow am to evaluate this area  #3 Screening: will check HIV rna   Thank you so much for this interesting consult  [facility] [phone] (pager) [phone] (office) 06/26/2012, 9:44 AM  Paulette Blanch Dam 06/26/2012, 9:44 AM

## 2012-06-26 NOTE — ED Provider Notes (Signed)
Medical screening examination/treatment/procedure(s) were performed by non-physician practitioner and as supervising physician I was immediately available for consultation/collaboration.   Gwyneth Sprout, MD 06/26/12 2306

## 2012-06-26 NOTE — Progress Notes (Signed)
Subjective: Erythema is progressing superiorly towards her R axilla.  Minimal drainage noted on her bandages.  The area is indurated but not fluctuant.  Pt has no difficulty with urination but states it is a darker color (dark yellow, not brown).  Pt also states her arm feels "tight," but is not different than yesterday.    Objective: Vital signs in last 24 hours: Temp:  [98 F (36.7 C)-101.7 F (38.7 C)] 98 F (36.7 C) (04/24 0616) Pulse Rate:  [95-113] 103 (04/24 0616) Resp:  [16] 16 (04/24 0616) BP: (106-116)/(54-68) 110/61 mmHg (04/24 0616) SpO2:  [93 %-97 %] 97 % (04/24 0616) Last BM Date: 06/23/12  Intake/Output from previous day:   Intake/Output this shift:    PE: Gen:  Alert, NAD, pleasant Ext:  R arm erythema that extends from distal midforearm cephalad to the axilla.  Forearm is much more hyperemic today.  Her cellulitis extends past her original outline, which is much worse than yesterday.  The inoculation area has no drainage, but with significant induration.  No fluctuance noted.  2+ radial pulse and sensation in tact.  Pt had normal ROM in phalanges.  Forearm compartment soft.  Lab Results:   Recent Labs  06/24/12 0817 06/25/12 0605  WBC 13.4* 13.7*  HGB 11.3* 11.9*  HCT 33.3* 35.9*  PLT 157 148*   BMET  Recent Labs  06/23/12 1800 06/24/12 0816  NA 132* 134*  K 3.7 3.5  CL 96 104  CO2 25 22  GLUCOSE 133* 105*  BUN 12 11  CREATININE 0.60 0.56  CALCIUM 9.3 8.2*   PT/INR No results found for this basename: LABPROT, INR,  in the last 72 hours CMP     Component Value Date/Time   NA 134* 06/24/2012 0816   K 3.5 06/24/2012 0816   CL 104 06/24/2012 0816   CO2 22 06/24/2012 0816   GLUCOSE 105* 06/24/2012 0816   BUN 11 06/24/2012 0816   CREATININE 0.56 06/24/2012 0816   CALCIUM 8.2* 06/24/2012 0816   PROT 7.3 06/23/2012 1800   ALBUMIN 3.5 06/23/2012 1800   AST 22 06/23/2012 1800   ALT 13 06/23/2012 1800   ALKPHOS 63 06/23/2012 1800   BILITOT 0.5  06/23/2012 1800   GFRNONAA >90 06/24/2012 0816   GFRAA >90 06/24/2012 0816   Lipase  No results found for this basename: lipase       Studies/Results: Dg Chest 1 View  06/25/2012  *RADIOLOGY REPORT*  Clinical Data: 62 year old female shortness of breath and wheezing.  CHEST - 1 VIEW  Comparison: Chest CT 02/01/2012 and earlier.  Findings: Upper lobe nodular and confluent airspace disease superimposed on chronic scarring on the right.  Lung bases appear stable.  Cardiac size and mediastinal contours are within normal limits.  Visualized tracheal air column is within normal limits. No pneumothorax or definite effusion.  IMPRESSION: Right upper lobe pneumonia superimposed on chronic lung disease. Recommend post-treatment radiographs to document resolution.   Original Report Authenticated By: Erskine Speed, M.D.     Anti-infectives: Anti-infectives   Start     Dose/Rate Route Frequency Ordered Stop   06/26/12 1000  levofloxacin (LEVAQUIN) tablet 750 mg     750 mg Oral Daily 06/26/12 0901     06/24/12 1200  levofloxacin (LEVAQUIN) IVPB 750 mg  Status:  Discontinued    Comments:  Not a true allergy, causes reflux   750 mg 100 mL/hr over 90 Minutes Intravenous Every 24 hours 06/24/12 1123 06/26/12 0901   06/24/12  0200  clindamycin (CLEOCIN) IVPB 600 mg  Status:  Discontinued     600 mg 100 mL/hr over 30 Minutes Intravenous 3 times per day 06/24/12 0141 06/26/12 0901   06/23/12 2115  clindamycin (CLEOCIN) IVPB 600 mg     600 mg 100 mL/hr over 30 Minutes Intravenous  Once 06/23/12 2104 06/23/12 2236       Assessment/Plan 1. Cellulitis of R arm 2/2 dog bite 2. R upper lobe PNA  Plan: 1. Due to extent of cellulitis, will add vancomycin, cont levaquin. There is currently nothing obvious to I & D; however, upon no improvement with ABX will consider surgical intervention. 2. Cont pain control 3. Will make MRI stat and make the pt NPO in case surgical intervention is warranted today.  4. Cont  lovenox/SCDs 5. She is NVI at this time.  Her compartments are soft.  There is no acute evidence for something more serious such as compartment syndrome or obvious necrotizing fasciitis right now.  6. Will check labs today and in the morning. 7. Will follow closely and recheck later today to determine status of surgical intervention.    LOS: 3 days    Cecile Hearing 06/26/2012, 9:35 AM [phone]

## 2012-06-26 NOTE — Progress Notes (Signed)
ANTIBIOTIC CONSULT NOTE - INITIAL  Pharmacy Consult for vancomycin Indication:  soft tissue infection/cellulitis with suspected abscess  Allergies  Allergen Reactions  . Azithromycin Shortness Of Breath  . Ceclor (Cefaclor) Shortness Of Breath  . Doxycycline Other (See Comments)    Redness on the face  . Penicillins Other (See Comments)    Unknown from childhood  . Septra (Sulfamethoxazole W-Trimethoprim) Rash    Patient Measurements: Height: 4' 11.5" (151.1 cm) Weight: 122 lb (55.339 kg) IBW/kg (Calculated) : 44.35   Vital Signs: Temp: 98 F (36.7 C) (04/24 0616) BP: 110/61 mmHg (04/24 0616) Pulse Rate: 103 (04/24 0616) Intake/Output from previous day:   Intake/Output from this shift:    Labs:  Recent Labs  06/23/12 1800 06/24/12 0540 06/24/12 0816 06/24/12 0817 06/25/12 0605  WBC 15.1* 9.8  --  13.4* 13.7*  HGB 15.0 9.2*  --  11.3* 11.9*  PLT 198 123*  --  157 148*  CREATININE 0.60  --  0.56  --   --    Estimated Creatinine Clearance: 58.3 ml/min (by C-G formula based on Cr of 0.56). No results found for this basename: VANCOTROUGH, VANCOPEAK, VANCORANDOM, GENTTROUGH, GENTPEAK, GENTRANDOM, TOBRATROUGH, TOBRAPEAK, TOBRARND, AMIKACINPEAK, AMIKACINTROU, AMIKACIN,  in the last 72 hours   Microbiology: No results found for this or any previous visit (from the past 720 hour(s)).  Medical History: Past Medical History  Diagnosis Date  . GERD (gastroesophageal reflux disease)   . Hypercholesterolemia   . Seasonal allergies   . Tobacco abuse   . Cataract     "? right" (06/24/2012)  . Aortic valve stenosis, moderate   . Heart murmur   . Mass of lung     "right spot; they are watching it" (06/24/2012)  . Cellulitis 06/20/2012    "dog bite; right forearm" (06/24/2012)  . Arthritis     "across my hips; comes w/the weather" (06/24/2012)   Assessment: Patient is a 62 y.o with forearm infection s/p dog bite.  Patient has been on levaquin and clindamycin since 4/22  with ID changing clindamycin to vancomycin today.  No cultures.  Patient also has hx lung nodule with infiltrate noted on CXR -- plan for chest CT on 4/25.  Goal of Therapy:  Vancomycin trough level 15-20 mcg/ml (will aim for higher trough goal until PNA is r/o)  Plan:  1) vancomycin 500mg  IV q12h  Teresa [name] P 06/26/2012,9:13 AM

## 2012-06-26 NOTE — Progress Notes (Signed)
Prelim cultures from our office done on 4/21 are a streptococcus species. No further details available right now.

## 2012-06-26 NOTE — Progress Notes (Signed)
Assessment/Plan: Principal Problem:   Cellulitis - this is stable. She had more fever to 101.7 yesterday but also has an infiltrate now (see below). Noted Dr. Dixon Boos comment about possible debridement. Continue IV abx.  Active Problems:   Aortic stenosis   COPD (chronic obstructive pulmonary disease) - she has a mild exacerbation with the pneumonia. I would like to try to avoid steroids if I can.    Dog bite of forearm   Pneumonia - this is a "new" infiltrate. No CXR at admit. I will ask ID to see: (1) is this HAP or CAP? (2) what abx?   Subjective: Over past 24 hours had some nausea and vomiting. Increased cough, wheeze, lower back pain. CXR done (see below)  Arm pain about the same.   Objective:  Vital Signs: Filed Vitals:   06/25/12 1756 06/25/12 2131 06/25/12 2143 06/26/12 0616  BP:   116/68 110/61  Pulse: 113  104 103  Temp: 101.7 F (38.7 C)  98.9 F (37.2 C) 98 F (36.7 C)  TempSrc: Oral     Resp:   16 16  Height:      Weight:      SpO2: 93% 95% 96% 97%     EXAM: Area on right arm seems more intensely red at this point.   LUNGS: cough, audible wheeze.    Intake/Output Summary (Last 24 hours) at 06/26/12 0637 Last data filed at 06/25/12 0656  Gross per 24 hour  Intake    720 ml  Output      0 ml  Net    720 ml    Lab Results:  Recent Labs  06/23/12 1800 06/24/12 0816  NA 132* 134*  K 3.7 3.5  CL 96 104  CO2 25 22  GLUCOSE 133* 105*  BUN 12 11  CREATININE 0.60 0.56  CALCIUM 9.3 8.2*    Recent Labs  06/23/12 1800  AST 22  ALT 13  ALKPHOS 63  BILITOT 0.5  PROT 7.3  ALBUMIN 3.5   No results found for this basename: LIPASE, AMYLASE,  in the last 72 hours  Recent Labs  06/23/12 1800  06/24/12 0817 06/25/12 0605  WBC 15.1*  < > 13.4* 13.7*  NEUTROABS 12.8*  --  10.8*  --   HGB 15.0  < > 11.3* 11.9*  HCT 42.7  < > 33.3* 35.9*  MCV 89.5  < > 90.0 90.4  PLT 198  < > 157 148*  < > = values in this interval not displayed. No results  found for this basename: CKTOTAL, CKMB, CKMBINDEX, TROPONINI,  in the last 72 hours No components found with this basename: POCBNP,  No results found for this basename: DDIMER,  in the last 72 hours No results found for this basename: HGBA1C,  in the last 72 hours No results found for this basename: CHOL, HDL, LDLCALC, TRIG, CHOLHDL, LDLDIRECT,  in the last 72 hours No results found for this basename: TSH, T4TOTAL, FREET3, T3FREE, THYROIDAB,  in the last 72 hours No results found for this basename: VITAMINB12, FOLATE, FERRITIN, TIBC, IRON, RETICCTPCT,  in the last 72 hours  Studies/Results: Dg Chest 1 View  06/25/2012  *RADIOLOGY REPORT*  Clinical Data: 62 year old female shortness of breath and wheezing.  CHEST - 1 VIEW  Comparison: Chest CT 02/01/2012 and earlier.  Findings: Upper lobe nodular and confluent airspace disease superimposed on chronic scarring on the right.  Lung bases appear stable.  Cardiac size and mediastinal contours are within normal limits.  Visualized tracheal air column is within normal limits. No pneumothorax or definite effusion.  IMPRESSION: Right upper lobe pneumonia superimposed on chronic lung disease. Recommend post-treatment radiographs to document resolution.   Original Report Authenticated By: Erskine Speed, M.D.    Medications: Medications administered in the last 24 hours reviewed.  Current Medication List reviewed.    LOS: 3 days   [facility] @ [facility] [phone]) 06/26/2012, 6:37 AM

## 2012-06-27 ENCOUNTER — Inpatient Hospital Stay (HOSPITAL_COMMUNITY): Payer: BC Managed Care – PPO

## 2012-06-27 LAB — CBC
HCT: 31.5 % — ABNORMAL LOW (ref 36.0–46.0)
Hemoglobin: 10.6 g/dL — ABNORMAL LOW (ref 12.0–15.0)
WBC: 7.5 10*3/uL (ref 4.0–10.5)

## 2012-06-27 LAB — BASIC METABOLIC PANEL
CO2: 32 mEq/L (ref 19–32)
Glucose, Bld: 97 mg/dL (ref 70–99)
Potassium: 3.6 mEq/L (ref 3.5–5.1)
Sodium: 137 mEq/L (ref 135–145)

## 2012-06-27 LAB — C-REACTIVE PROTEIN: CRP: 12.1 mg/dL — ABNORMAL HIGH (ref <0.60)

## 2012-06-27 MED ORDER — DEXTROSE-NACL 5-0.45 % IV SOLN
INTRAVENOUS | Status: DC
  Administered 2012-06-27: 08:00:00 via INTRAVENOUS

## 2012-06-27 MED ORDER — BACITRACIN-NEOMYCIN-POLYMYXIN OINTMENT TUBE
TOPICAL_OINTMENT | Freq: Two times a day (BID) | CUTANEOUS | Status: DC
  Administered 2012-06-27 (×2): via TOPICAL
  Administered 2012-06-28: 1 via TOPICAL
  Administered 2012-06-28 – 2012-06-29 (×2): via TOPICAL
  Filled 2012-06-27 (×2): qty 15
  Filled 2012-06-27: qty 1
  Filled 2012-06-27: qty 15

## 2012-06-27 MED ORDER — IOHEXOL 300 MG/ML  SOLN
80.0000 mL | Freq: Once | INTRAMUSCULAR | Status: AC | PRN
  Administered 2012-06-27: 80 mL via INTRAVENOUS

## 2012-06-27 MED ORDER — IBUPROFEN 600 MG PO TABS
600.0000 mg | ORAL_TABLET | Freq: Three times a day (TID) | ORAL | Status: DC | PRN
  Administered 2012-06-27: 600 mg via ORAL
  Filled 2012-06-27 (×2): qty 1

## 2012-06-27 NOTE — Progress Notes (Signed)
Will decide about surgery based on clinical findings.  Teresa [name]. [name], MD, FACS [phone] [facility]

## 2012-06-27 NOTE — Progress Notes (Signed)
Assessment/Plan: Principal Problem:   Cellulitis - reviewed MRI. No definite abscess but close. Will await ID and [facility] opinions. She is currently NPO. If there is no plan for surgery, would appreciate [facility] putting her back on regular diet.  Active Problems:   Aortic stenosis   COPD (chronic obstructive pulmonary disease)   Dog bite of forearm   Pneumonia - MRI showed mainly PNA. CT scan is pending.   I am going out of town today for two weeks. Patient and husband aware. My colleagues will assure care.    Subjective: She is breathing better. Wheeze is diminished.  Less pain in arm.   Objective:  Vital Signs: Filed Vitals:   06/26/12 1420 06/26/12 2042 06/26/12 2212 06/27/12 0517  BP:   120/73 113/56  Pulse:   70 66  Temp:   98.6 F (37 C) 98.4 F (36.9 C)  TempSrc:   Oral Oral  Resp:   16 16  Height:      Weight:      SpO2: 97% 97% 98% 97%     EXAM: More intense red but more localized area on right forearm. Area looks like it may drain again  No wheeze   Intake/Output Summary (Last 24 hours) at 06/27/12 0750 Last data filed at 06/27/12 0600  Gross per 24 hour  Intake    480 ml  Output      0 ml  Net    480 ml    Lab Results:  Recent Labs  06/24/12 0816 06/27/12 0545  NA 134* 137  K 3.5 3.6  CL 104 98  CO2 22 32  GLUCOSE 105* 97  BUN 11 6  CREATININE 0.56 0.46*  CALCIUM 8.2* 8.8   No results found for this basename: AST, ALT, ALKPHOS, BILITOT, PROT, ALBUMIN,  in the last 72 hours No results found for this basename: LIPASE, AMYLASE,  in the last 72 hours  Recent Labs  06/24/12 0817  06/26/12 0937 06/27/12 0545  WBC 13.4*  < > 8.8 7.5  NEUTROABS 10.8*  --  7.1  --   HGB 11.3*  < > 10.8* 10.6*  HCT 33.3*  < > 30.9* 31.5*  MCV 90.0  < > 88.8 89.7  PLT 157  < > 164 192  < > = values in this interval not displayed. No results found for this basename: CKTOTAL, CKMB, CKMBINDEX, TROPONINI,  in the last 72 hours No components found with this  basename: POCBNP,  No results found for this basename: DDIMER,  in the last 72 hours No results found for this basename: HGBA1C,  in the last 72 hours No results found for this basename: CHOL, HDL, LDLCALC, TRIG, CHOLHDL, LDLDIRECT,  in the last 72 hours No results found for this basename: TSH, T4TOTAL, FREET3, T3FREE, THYROIDAB,  in the last 72 hours No results found for this basename: VITAMINB12, FOLATE, FERRITIN, TIBC, IRON, RETICCTPCT,  in the last 72 hours  Studies/Results: Dg Chest 1 View  06/25/2012  *RADIOLOGY REPORT*  Clinical Data: 62 year old female shortness of breath and wheezing.  CHEST - 1 VIEW  Comparison: Chest CT 02/01/2012 and earlier.  Findings: Upper lobe nodular and confluent airspace disease superimposed on chronic scarring on the right.  Lung bases appear stable.  Cardiac size and mediastinal contours are within normal limits.  Visualized tracheal air column is within normal limits. No pneumothorax or definite effusion.  IMPRESSION: Right upper lobe pneumonia superimposed on chronic lung disease. Recommend post-treatment radiographs to document resolution.  Original Report Authenticated By: Erskine Speed, M.D.    Mr Humerus Right W Wo Contrast  06/26/2012  *RADIOLOGY REPORT*  Clinical Data: Dog bite with upper extremity cellulitis and possible abscess.  Draining wound.  MRI OF THE RIGHT HUMERUS WITHOUT AND WITH CONTRAST  Technique:  Multiplanar, multisequence MR imaging was performed both before and after administration of intravenous contrast.  Contrast:  10 ml Multihance  Comparison: None.  Findings: Abnormal subcutaneous edema and enhancement is observed around the elbow and tracking in the right upper arm nearly to the upper axillary fold level.  This edema and enhancement compatible with cellulitis is more confluent distally and becomes less prominent proximally.  No osseous edema or significant deep fascial plane edema noted.  No abscess along the upper arm is observed.  On  image 10 of series 12 there is some edema along the right lateral abdominal wall.  We also demonstrated band of signal hyperintensity in the right upper lobe on images 2-17 of series 12 suspicious for pneumonia, with patchy surrounding opacities.  IMPRESSION:  1.  Cellulitis in the right upper arm more striking in the vicinity of the elbow.  Although this extends up to the level of the axilla, it becomes less prominent in the upper portion of the upper arm. No abscess of the upper arm identified. 2.  Right upper lobe pneumonia. 3.  Abnormal edema in the posterolateral margin of the abdominal wall is observed, of uncertain significance.   Original Report Authenticated By: Gaylyn Rong, M.D.    Mr Forearm Right Wo/w Cm  06/26/2012  *RADIOLOGY REPORT*  Clinical Data: Fever.  Edema in the right arm.  Tenderness in the right arm.  Prior dog bite.  Drainage.  Assessment for deep abscess.  MRI OF THE RIGHT FOREARM WITHOUT AND WITH CONTRAST  Technique:  Multiplanar, multisequence MR imaging was performed both before and after administration of intravenous contrast.  Contrast:  10 ml Multihance  Comparison: None.  Findings: The patient was imaged with her arm by her side.  In the forearm, abnormal subcutaneous edema extends from the elbow all the way down to the wrist, including significant enhancement indicative of diffuse cellulitis of the forearm.  This is more confluent posteriorly in the proximal forearm as on images 5-13 of series 21, where there is significant phlegmon infiltrative fluid signal with poor enhancement suggesting an incipient abscess formation, but with linear infiltrative configuration more characteristic of flight month and drainable abscess at this time.  Edema and enhancement tracks along the superficial fascial margin into a lesser extent along the anterior compartmental fascial planes.  No osteomyelitis observed.  No definite gas is visualized tracking in the soft tissues.  IMPRESSION:  1.   Abnormal diffuse cellulitis of the forearm, with confluent phlegmon posteriorly in the proximal forearm.  The configuration is currently more characteristic of prominent phlegmon than drainable abscess although given the poor enhancement along the proximal posterior forearm, incipient abscess is difficult to completely exclude. 2.  Low-level enhancement along fascial planes in the anterior compartment of the forearm.  This may reflect mild early fasciitis.   Original Report Authenticated By: Gaylyn Rong, M.D.    Medications: Medications administered in the last 24 hours reviewed.  Current Medication List reviewed.    LOS: 4 days   [facility] @ [facility] [phone]) 06/27/2012, 7:50 AM

## 2012-06-27 NOTE — Progress Notes (Addendum)
[facility] Progress Note    Subjective: Right arm is much better.  Induration of the arm is much better, but the erythema is about the same.  Objective: Vital signs in last 24 hours: Temp:  [97.5 F (36.4 C)-98.6 F (37 C)] 98.4 F (36.9 C) (04/25 0517) Pulse Rate:  [66-70] 66 (04/25 0517) Resp:  [16-17] 16 (04/25 0517) BP: (113-126)/(54-73) 113/56 mmHg (04/25 0517) SpO2:  [97 %-98 %] 97 % (04/25 0517) Last BM Date: 06/23/12  Intake/Output from previous day: 04/24 0701 - 04/25 0700 In: 480 [P.O.:240; I.V.:240] Out: -  Intake/Output this shift:    General: No acute distress  Lungs: Has some rattling with cough.  Abd: Benign  Extremities: Blisters in the center of the volar forearm.  Ready to be unroofed, but I do not believe that she needs to go to the OR for this.  This can be done at the bedside  Neuro: Intact  Lab Results:  @LABLAST2 (wbc:2,hgb:2,hct:2,plt:2) BMET  Recent Labs  06/27/12 0545  NA 137  K 3.6  CL 98  CO2 32  GLUCOSE 97  BUN 6  CREATININE 0.46*  CALCIUM 8.8   PT/INR No results found for this basename: LABPROT, INR,  in the last 72 hours ABG No results found for this basename: PHART, PCO2, PO2, HCO3,  in the last 72 hours  Studies/Results: Dg Chest 1 View  06/25/2012  *RADIOLOGY REPORT*  Clinical Data: 62 year old female shortness of breath and wheezing.  CHEST - 1 VIEW  Comparison: Chest CT 02/01/2012 and earlier.  Findings: Upper lobe nodular and confluent airspace disease superimposed on chronic scarring on the right.  Lung bases appear stable.  Cardiac size and mediastinal contours are within normal limits.  Visualized tracheal air column is within normal limits. No pneumothorax or definite effusion.  IMPRESSION: Right upper lobe pneumonia superimposed on chronic lung disease. Recommend post-treatment radiographs to document resolution.   Original Report Authenticated By: Erskine Speed, M.D.    Ct Chest W Contrast  06/27/2012  *RADIOLOGY  REPORT*  Clinical Data: Fever  CT CHEST WITH CONTRAST  Technique:  Multidetector CT imaging of the chest was performed following the standard protocol during bolus administration of intravenous contrast.  Contrast: 80mL OMNIPAQUE IOHEXOL 300 MG/ML  SOLN  Comparison: 02/01/2012  Findings: Pulmonary parenchymal disease has progressed compared with the prior study.  Irregular opacities with a predilection for the upper lobes are characterized by a combination of ground-glass density and interlobular septal thickening.  It is most pronounced in the posterior segment of the right upper lobe.  There are scattered areas in the right upper lobe, right middle lobe, apical left upper lobe, and lingula.  Minimal disease in the dependent portion of both lower lobes.  Irregular pulmonary opacity with air bronchograms towards the right apex on image 13 is slightly larger compared with prior imaging. It is 2.0 x 1.5 cm on image 13 on today's study.  Small bilateral pleural effusions have developed.  Mediastinal nodes have become more prominent.  They have increased in number and size.  10 mm prevascular node on image 20.  9 mm precarinal node on image 22.  10 mm subcarinal node on image 28. Sub centimeter paratracheal nodes towards the thoracic inlet.  Borderline enlarged right axillary nodes.  11 mm right axillary node on image 10.  No pneumothorax.  Atherosclerotic vascular calcifications are unchanged.  Images of the upper abdomen demonstrate fluid between the gallbladder and liver which is nonspecific.  11 mm short axis  diameter celiac node on image 55.  IMPRESSION: There is been a marked progression and pulmonary parenchymal disease since prior studies.  This includes an irregular opacity towards the right apex which is enlarging and several areas of ground-glass opacity with interlobular septal thickening.  There is also associated increasing adenopathy as described.  Differential diagnosis includes inflammatory and  neoplastic etiology. Infectious etiology should include opportunistic infections. Neoplastic etiology includes bronchoalveolar carcinoma and lymphoma.  Noninfectious inflammatory etiologies include organizing pneumonia, hypersensitivity pneumonitis, alveolar proteinosis. This is not a characteristic appearance for idiopathic pulmonary fibrosis or sarcoidosis.  PET CT or biopsy may be helpful.   Original Report Authenticated By: Jolaine Click, M.D.    Mr Humerus Right W Wo Contrast  06/26/2012  *RADIOLOGY REPORT*  Clinical Data: Dog bite with upper extremity cellulitis and possible abscess.  Draining wound.  MRI OF THE RIGHT HUMERUS WITHOUT AND WITH CONTRAST  Technique:  Multiplanar, multisequence MR imaging was performed both before and after administration of intravenous contrast.  Contrast:  10 ml Multihance  Comparison: None.  Findings: Abnormal subcutaneous edema and enhancement is observed around the elbow and tracking in the right upper arm nearly to the upper axillary fold level.  This edema and enhancement compatible with cellulitis is more confluent distally and becomes less prominent proximally.  No osseous edema or significant deep fascial plane edema noted.  No abscess along the upper arm is observed.  On image 10 of series 12 there is some edema along the right lateral abdominal wall.  We also demonstrated band of signal hyperintensity in the right upper lobe on images 2-17 of series 12 suspicious for pneumonia, with patchy surrounding opacities.  IMPRESSION:  1.  Cellulitis in the right upper arm more striking in the vicinity of the elbow.  Although this extends up to the level of the axilla, it becomes less prominent in the upper portion of the upper arm. No abscess of the upper arm identified. 2.  Right upper lobe pneumonia. 3.  Abnormal edema in the posterolateral margin of the abdominal wall is observed, of uncertain significance.   Original Report Authenticated By: Gaylyn Rong, M.D.    Mr  Forearm Right Wo/w Cm  06/26/2012  *RADIOLOGY REPORT*  Clinical Data: Fever.  Edema in the right arm.  Tenderness in the right arm.  Prior dog bite.  Drainage.  Assessment for deep abscess.  MRI OF THE RIGHT FOREARM WITHOUT AND WITH CONTRAST  Technique:  Multiplanar, multisequence MR imaging was performed both before and after administration of intravenous contrast.  Contrast:  10 ml Multihance  Comparison: None.  Findings: The patient was imaged with her arm by her side.  In the forearm, abnormal subcutaneous edema extends from the elbow all the way down to the wrist, including significant enhancement indicative of diffuse cellulitis of the forearm.  This is more confluent posteriorly in the proximal forearm as on images 5-13 of series 21, where there is significant phlegmon infiltrative fluid signal with poor enhancement suggesting an incipient abscess formation, but with linear infiltrative configuration more characteristic of flight month and drainable abscess at this time.  Edema and enhancement tracks along the superficial fascial margin into a lesser extent along the anterior compartmental fascial planes.  No osteomyelitis observed.  No definite gas is visualized tracking in the soft tissues.  IMPRESSION:  1.  Abnormal diffuse cellulitis of the forearm, with confluent phlegmon posteriorly in the proximal forearm.  The configuration is currently more characteristic of prominent phlegmon than drainable abscess  although given the poor enhancement along the proximal posterior forearm, incipient abscess is difficult to completely exclude. 2.  Low-level enhancement along fascial planes in the anterior compartment of the forearm.  This may reflect mild early fasciitis.   Original Report Authenticated By: Gaylyn Rong, M.D.     Anti-infectives: Anti-infectives   Start     Dose/Rate Route Frequency Ordered Stop   06/26/12 1100  vancomycin (VANCOCIN) 500 mg in sodium chloride 0.9 % 100 mL IVPB     500  mg 100 mL/hr over 60 Minutes Intravenous Every 12 hours 06/26/12 1016     06/26/12 1000  levofloxacin (LEVAQUIN) tablet 750 mg     750 mg Oral Daily 06/26/12 0901     06/24/12 1200  levofloxacin (LEVAQUIN) IVPB 750 mg  Status:  Discontinued    Comments:  Not a true allergy, causes reflux   750 mg 100 mL/hr over 90 Minutes Intravenous Every 24 hours 06/24/12 1123 06/26/12 0901   06/24/12 0200  clindamycin (CLEOCIN) IVPB 600 mg  Status:  Discontinued     600 mg 100 mL/hr over 30 Minutes Intravenous 3 times per day 06/24/12 0141 06/26/12 0901   06/23/12 2115  clindamycin (CLEOCIN) IVPB 600 mg     600 mg 100 mL/hr over 30 Minutes Intravenous  Once 06/23/12 2104 06/23/12 2236      Assessment/Plan: s/p  Advance diet Debside blister unroofing with cultures to be sent.   LOS: 4 days   Marta Lamas. [name], MD, FACS [phone] [facility] 06/27/2012  ADDENDUM: The right forearm was prepped under sterile conditions.  A small opening was made in her blister and the fluid was cultured.  The rest of the blistered skin was removed.  Petroleum gauze was placed on the site until neosporin ointment can be obtained.  Patient tolerated well.   Cecylia Brazill E

## 2012-06-27 NOTE — Progress Notes (Signed)
[facility]  Day # 4 levaquin Day #2 vancomycin   Subjective: No new complaints   Antibiotics:  Anti-infectives   Start     Dose/Rate Route Frequency Ordered Stop   06/26/12 1100  vancomycin (VANCOCIN) 500 mg in sodium chloride 0.9 % 100 mL IVPB     500 mg 100 mL/hr over 60 Minutes Intravenous Every 12 hours 06/26/12 1016     06/26/12 1000  levofloxacin (LEVAQUIN) tablet 750 mg     750 mg Oral Daily 06/26/12 0901     06/24/12 1200  levofloxacin (LEVAQUIN) IVPB 750 mg  Status:  Discontinued    Comments:  Not a true allergy, causes reflux   750 mg 100 mL/hr over 90 Minutes Intravenous Every 24 hours 06/24/12 1123 06/26/12 0901   06/24/12 0200  clindamycin (CLEOCIN) IVPB 600 mg  Status:  Discontinued     600 mg 100 mL/hr over 30 Minutes Intravenous 3 times per day 06/24/12 0141 06/26/12 0901   06/23/12 2115  clindamycin (CLEOCIN) IVPB 600 mg     600 mg 100 mL/hr over 30 Minutes Intravenous  Once 06/23/12 2104 06/23/12 2236      Medications: Scheduled Meds: . albuterol  2.5 mg Nebulization TID  . budesonide-formoterol  2 puff Inhalation BID  . docusate sodium  100 mg Oral BID  . enoxaparin (LOVENOX) injection  40 mg Subcutaneous Q24H  . levofloxacin  750 mg Oral Daily  . neomycin-bacitracin-polymyxin   Topical BID  . pantoprazole  40 mg Oral BID AC  . vancomycin  500 mg Intravenous Q12H   Continuous Infusions: . dextrose 5 % and 0.45% NaCl 100 mL/hr at 06/27/12 0755   PRN Meds:.albuterol, HYDROcodone-acetaminophen, ibuprofen, magnesium hydroxide, morphine injection, ondansetron (ZOFRAN) IV, ondansetron   Objective: Weight change:   Intake/Output Summary (Last 24 hours) at 06/27/12 1741 Last data filed at 06/27/12 0600  Gross per 24 hour  Intake    480 ml  Output      0 ml  Net    480 ml   Blood pressure 113/56, pulse 66, temperature 98.4 F (36.9 C), temperature source Oral, resp. rate 16, height 4' 11.5" (1.511 m), weight 122 lb  (55.339 kg), last menstrual period 10/04/1999, SpO2 96.00%. Temp:  [98.4 F (36.9 C)-98.6 F (37 C)] 98.4 F (36.9 C) (04/25 0517) Pulse Rate:  [66-70] 66 (04/25 0517) Resp:  [16] 16 (04/25 0517) BP: (113-120)/(56-73) 113/56 mmHg (04/25 0517) SpO2:  [96 %-98 %] 96 % (04/25 0932)  Physical Exam: General: Alert and awake, oriented x3, not in any acute distress.  HEENT: anicteric sclera, pupils reactive to light and accommodation, EOMI, oropharynx clear and without exudate  CVS regular rate, normal r, no murmur rubs or gallops  Chest: wheezing, rales or rhonchi  Abdomen: soft nontender, nondistended, normal bowel sounds,  Extremities:skin: right foreram with intense erythema and thickened skin, tender, she also has tenderness proximally involving her upper arm with subtle erythema ,tenderness Neuro: nonfocal, strength and sensation intact  Lab Results:  Recent Labs  06/26/12 0937 06/27/12 0545  WBC 8.8 7.5  HGB 10.8* 10.6*  HCT 30.9* 31.5*  PLT 164 192    BMET  Recent Labs  06/27/12 0545  NA 137  K 3.6  CL 98  CO2 32  GLUCOSE 97  BUN 6  CREATININE 0.46*  CALCIUM 8.8    Micro Results: No results found for this or any previous visit (from the past 240 hour(s)).  Studies/Results: Dg Chest  1 View  06/25/2012  *RADIOLOGY REPORT*  Clinical Data: 62 year old female shortness of breath and wheezing.  CHEST - 1 VIEW  Comparison: Chest CT 02/01/2012 and earlier.  Findings: Upper lobe nodular and confluent airspace disease superimposed on chronic scarring on the right.  Lung bases appear stable.  Cardiac size and mediastinal contours are within normal limits.  Visualized tracheal air column is within normal limits. No pneumothorax or definite effusion.  IMPRESSION: Right upper lobe pneumonia superimposed on chronic lung disease. Recommend post-treatment radiographs to document resolution.   Original Report Authenticated By: Erskine Speed, M.D.    Ct Chest W Contrast  06/27/2012   *RADIOLOGY REPORT*  Clinical Data: Fever  CT CHEST WITH CONTRAST  Technique:  Multidetector CT imaging of the chest was performed following the standard protocol during bolus administration of intravenous contrast.  Contrast: 80mL OMNIPAQUE IOHEXOL 300 MG/ML  SOLN  Comparison: 02/01/2012  Findings: Pulmonary parenchymal disease has progressed compared with the prior study.  Irregular opacities with a predilection for the upper lobes are characterized by a combination of ground-glass density and interlobular septal thickening.  It is most pronounced in the posterior segment of the right upper lobe.  There are scattered areas in the right upper lobe, right middle lobe, apical left upper lobe, and lingula.  Minimal disease in the dependent portion of both lower lobes.  Irregular pulmonary opacity with air bronchograms towards the right apex on image 13 is slightly larger compared with prior imaging. It is 2.0 x 1.5 cm on image 13 on today's study.  Small bilateral pleural effusions have developed.  Mediastinal nodes have become more prominent.  They have increased in number and size.  10 mm prevascular node on image 20.  9 mm precarinal node on image 22.  10 mm subcarinal node on image 28. Sub centimeter paratracheal nodes towards the thoracic inlet.  Borderline enlarged right axillary nodes.  11 mm right axillary node on image 10.  No pneumothorax.  Atherosclerotic vascular calcifications are unchanged.  Images of the upper abdomen demonstrate fluid between the gallbladder and liver which is nonspecific.  11 mm short axis diameter celiac node on image 55.  IMPRESSION: There is been a marked progression and pulmonary parenchymal disease since prior studies.  This includes an irregular opacity towards the right apex which is enlarging and several areas of ground-glass opacity with interlobular septal thickening.  There is also associated increasing adenopathy as described.  Differential diagnosis includes inflammatory  and neoplastic etiology. Infectious etiology should include opportunistic infections. Neoplastic etiology includes bronchoalveolar carcinoma and lymphoma.  Noninfectious inflammatory etiologies include organizing pneumonia, hypersensitivity pneumonitis, alveolar proteinosis. This is not a characteristic appearance for idiopathic pulmonary fibrosis or sarcoidosis.  PET CT or biopsy may be helpful.   Original Report Authenticated By: Jolaine Click, M.D.    Mr Humerus Right W Wo Contrast  06/26/2012  *RADIOLOGY REPORT*  Clinical Data: Dog bite with upper extremity cellulitis and possible abscess.  Draining wound.  MRI OF THE RIGHT HUMERUS WITHOUT AND WITH CONTRAST  Technique:  Multiplanar, multisequence MR imaging was performed both before and after administration of intravenous contrast.  Contrast:  10 ml Multihance  Comparison: None.  Findings: Abnormal subcutaneous edema and enhancement is observed around the elbow and tracking in the right upper arm nearly to the upper axillary fold level.  This edema and enhancement compatible with cellulitis is more confluent distally and becomes less prominent proximally.  No osseous edema or significant deep fascial plane edema noted.  No abscess along the upper arm is observed.  On image 10 of series 12 there is some edema along the right lateral abdominal wall.  We also demonstrated band of signal hyperintensity in the right upper lobe on images 2-17 of series 12 suspicious for pneumonia, with patchy surrounding opacities.  IMPRESSION:  1.  Cellulitis in the right upper arm more striking in the vicinity of the elbow.  Although this extends up to the level of the axilla, it becomes less prominent in the upper portion of the upper arm. No abscess of the upper arm identified. 2.  Right upper lobe pneumonia. 3.  Abnormal edema in the posterolateral margin of the abdominal wall is observed, of uncertain significance.   Original Report Authenticated By: Gaylyn Rong, M.D.     Mr Forearm Right Wo/w Cm  06/26/2012  *RADIOLOGY REPORT*  Clinical Data: Fever.  Edema in the right arm.  Tenderness in the right arm.  Prior dog bite.  Drainage.  Assessment for deep abscess.  MRI OF THE RIGHT FOREARM WITHOUT AND WITH CONTRAST  Technique:  Multiplanar, multisequence MR imaging was performed both before and after administration of intravenous contrast.  Contrast:  10 ml Multihance  Comparison: None.  Findings: The patient was imaged with her arm by her side.  In the forearm, abnormal subcutaneous edema extends from the elbow all the way down to the wrist, including significant enhancement indicative of diffuse cellulitis of the forearm.  This is more confluent posteriorly in the proximal forearm as on images 5-13 of series 21, where there is significant phlegmon infiltrative fluid signal with poor enhancement suggesting an incipient abscess formation, but with linear infiltrative configuration more characteristic of flight month and drainable abscess at this time.  Edema and enhancement tracks along the superficial fascial margin into a lesser extent along the anterior compartmental fascial planes.  No osteomyelitis observed.  No definite gas is visualized tracking in the soft tissues.  IMPRESSION:  1.  Abnormal diffuse cellulitis of the forearm, with confluent phlegmon posteriorly in the proximal forearm.  The configuration is currently more characteristic of prominent phlegmon than drainable abscess although given the poor enhancement along the proximal posterior forearm, incipient abscess is difficult to completely exclude. 2.  Low-level enhancement along fascial planes in the anterior compartment of the forearm.  This may reflect mild early fasciitis.   Original Report Authenticated By: Gaylyn Rong, M.D.       Assessment/Plan: Teresa [name] is a 62 y.o. female with   with dog bite to forearm and soft tissue infection, cellulitis and possible abscess with mx allergies  including to PCN, Ceph--> difficulty breathing, now found on CXR and CT to have worrisome progressive  RUL lesions t   #1 Dog bite with soft tissue infection: MRI shows likely early phlegmon and CCS have performed further bedside debridement of blistering area  --continue levaquin and vancomycin --May need to repeat MRI in 5-7 days or so as this may evolve into a frank abscess --fu cultures from Dr. Karma Greaser office   #2 RUL progressive lesions:HIGHLY worrisome for malignancy  --would consult [facility] to consider another bronchoscopy vs CVTS for Biopsy of these lesions   #3 Screening: will check HIV rna   Dr. Luciana Axe is covering this weekend.   LOS: 4 days   Acey Lav 06/27/2012, 5:41 PM

## 2012-06-28 LAB — VANCOMYCIN, TROUGH: Vancomycin Tr: <5 ug/mL — ABNORMAL LOW (ref 10.0–20.0)

## 2012-06-28 MED ORDER — VANCOMYCIN HCL IN DEXTROSE 750-5 MG/150ML-% IV SOLN
750.0000 mg | Freq: Three times a day (TID) | INTRAVENOUS | Status: DC
  Administered 2012-06-29 (×2): 750 mg via INTRAVENOUS
  Filled 2012-06-28 (×4): qty 150

## 2012-06-28 NOTE — Progress Notes (Signed)
Patient ID: Teresa [name], female   DOB: [birth year], 62 y.o.   MRN: [id number]    Subjective: Pt feels ok today.  Some better  Objective: Vital signs in last 24 hours: Temp:  [97.7 F (36.5 C)-98.3 F (36.8 C)] 97.7 F (36.5 C) (04/26 0602) Pulse Rate:  [90-98] 93 (04/26 0602) Resp:  [16-18] 16 (04/26 0602) BP: (117-126)/(71-91) 126/71 mmHg (04/26 0602) SpO2:  [97 %-100 %] 97 % (04/26 0726) Last BM Date: 06/23/12  Intake/Output from previous day: 04/25 0701 - 04/26 0700 In: 720 [P.O.:720] Out: -  Intake/Output this shift:    PE: Ext: right UE with some retraction of her cellulitis up to her axilla.  Still with erythema along the inferior aspect.  Focally around the inoculation site and where her large vesicle was is still hyperemic.  No evidence of drainable abscess.  Lab Results:   Recent Labs  06/26/12 0937 06/27/12 0545  WBC 8.8 7.5  HGB 10.8* 10.6*  HCT 30.9* 31.5*  PLT 164 192   BMET  Recent Labs  06/27/12 0545  NA 137  K 3.6  CL 98  CO2 32  GLUCOSE 97  BUN 6  CREATININE 0.46*  CALCIUM 8.8   PT/INR No results found for this basename: LABPROT, INR,  in the last 72 hours CMP     Component Value Date/Time   NA 137 06/27/2012 0545   K 3.6 06/27/2012 0545   CL 98 06/27/2012 0545   CO2 32 06/27/2012 0545   GLUCOSE 97 06/27/2012 0545   BUN 6 06/27/2012 0545   CREATININE 0.46* 06/27/2012 0545   CALCIUM 8.8 06/27/2012 0545   PROT 7.3 06/23/2012 1800   ALBUMIN 3.5 06/23/2012 1800   AST 22 06/23/2012 1800   ALT 13 06/23/2012 1800   ALKPHOS 63 06/23/2012 1800   BILITOT 0.5 06/23/2012 1800   GFRNONAA >90 06/27/2012 0545   GFRAA >90 06/27/2012 0545   Lipase  No results found for this basename: lipase       Studies/Results: Ct Chest W Contrast  06/27/2012  *RADIOLOGY REPORT*  Clinical Data: Fever  CT CHEST WITH CONTRAST  Technique:  Multidetector CT imaging of the chest was performed following the standard protocol during bolus administration of intravenous  contrast.  Contrast: 80mL OMNIPAQUE IOHEXOL 300 MG/ML  SOLN  Comparison: 02/01/2012  Findings: Pulmonary parenchymal disease has progressed compared with the prior study.  Irregular opacities with a predilection for the upper lobes are characterized by a combination of ground-glass density and interlobular septal thickening.  It is most pronounced in the posterior segment of the right upper lobe.  There are scattered areas in the right upper lobe, right middle lobe, apical left upper lobe, and lingula.  Minimal disease in the dependent portion of both lower lobes.  Irregular pulmonary opacity with air bronchograms towards the right apex on image 13 is slightly larger compared with prior imaging. It is 2.0 x 1.5 cm on image 13 on today's study.  Small bilateral pleural effusions have developed.  Mediastinal nodes have become more prominent.  They have increased in number and size.  10 mm prevascular node on image 20.  9 mm precarinal node on image 22.  10 mm subcarinal node on image 28. Sub centimeter paratracheal nodes towards the thoracic inlet.  Borderline enlarged right axillary nodes.  11 mm right axillary node on image 10.  No pneumothorax.  Atherosclerotic vascular calcifications are unchanged.  Images of the upper abdomen demonstrate fluid between the gallbladder and  liver which is nonspecific.  11 mm short axis diameter celiac node on image 55.  IMPRESSION: There is been a marked progression and pulmonary parenchymal disease since prior studies.  This includes an irregular opacity towards the right apex which is enlarging and several areas of ground-glass opacity with interlobular septal thickening.  There is also associated increasing adenopathy as described.  Differential diagnosis includes inflammatory and neoplastic etiology. Infectious etiology should include opportunistic infections. Neoplastic etiology includes bronchoalveolar carcinoma and lymphoma.  Noninfectious inflammatory etiologies include  organizing pneumonia, hypersensitivity pneumonitis, alveolar proteinosis. This is not a characteristic appearance for idiopathic pulmonary fibrosis or sarcoidosis.  PET CT or biopsy may be helpful.   Original Report Authenticated By: Jolaine Click, M.D.    Mr Humerus Right W Wo Contrast  06/26/2012  *RADIOLOGY REPORT*  Clinical Data: Dog bite with upper extremity cellulitis and possible abscess.  Draining wound.  MRI OF THE RIGHT HUMERUS WITHOUT AND WITH CONTRAST  Technique:  Multiplanar, multisequence MR imaging was performed both before and after administration of intravenous contrast.  Contrast:  10 ml Multihance  Comparison: None.  Findings: Abnormal subcutaneous edema and enhancement is observed around the elbow and tracking in the right upper arm nearly to the upper axillary fold level.  This edema and enhancement compatible with cellulitis is more confluent distally and becomes less prominent proximally.  No osseous edema or significant deep fascial plane edema noted.  No abscess along the upper arm is observed.  On image 10 of series 12 there is some edema along the right lateral abdominal wall.  We also demonstrated band of signal hyperintensity in the right upper lobe on images 2-17 of series 12 suspicious for pneumonia, with patchy surrounding opacities.  IMPRESSION:  1.  Cellulitis in the right upper arm more striking in the vicinity of the elbow.  Although this extends up to the level of the axilla, it becomes less prominent in the upper portion of the upper arm. No abscess of the upper arm identified. 2.  Right upper lobe pneumonia. 3.  Abnormal edema in the posterolateral margin of the abdominal wall is observed, of uncertain significance.   Original Report Authenticated By: Gaylyn Rong, M.D.    Mr Forearm Right Wo/w Cm  06/26/2012  *RADIOLOGY REPORT*  Clinical Data: Fever.  Edema in the right arm.  Tenderness in the right arm.  Prior dog bite.  Drainage.  Assessment for deep abscess.  MRI  OF THE RIGHT FOREARM WITHOUT AND WITH CONTRAST  Technique:  Multiplanar, multisequence MR imaging was performed both before and after administration of intravenous contrast.  Contrast:  10 ml Multihance  Comparison: None.  Findings: The patient was imaged with her arm by her side.  In the forearm, abnormal subcutaneous edema extends from the elbow all the way down to the wrist, including significant enhancement indicative of diffuse cellulitis of the forearm.  This is more confluent posteriorly in the proximal forearm as on images 5-13 of series 21, where there is significant phlegmon infiltrative fluid signal with poor enhancement suggesting an incipient abscess formation, but with linear infiltrative configuration more characteristic of flight month and drainable abscess at this time.  Edema and enhancement tracks along the superficial fascial margin into a lesser extent along the anterior compartmental fascial planes.  No osteomyelitis observed.  No definite gas is visualized tracking in the soft tissues.  IMPRESSION:  1.  Abnormal diffuse cellulitis of the forearm, with confluent phlegmon posteriorly in the proximal forearm.  The configuration is  currently more characteristic of prominent phlegmon than drainable abscess although given the poor enhancement along the proximal posterior forearm, incipient abscess is difficult to completely exclude. 2.  Low-level enhancement along fascial planes in the anterior compartment of the forearm.  This may reflect mild early fasciitis.   Original Report Authenticated By: Gaylyn Rong, M.D.     Anti-infectives: Anti-infectives   Start     Dose/Rate Route Frequency Ordered Stop   06/26/12 1100  vancomycin (VANCOCIN) 500 mg in sodium chloride 0.9 % 100 mL IVPB     500 mg 100 mL/hr over 60 Minutes Intravenous Every 12 hours 06/26/12 1016     06/26/12 1000  levofloxacin (LEVAQUIN) tablet 750 mg     750 mg Oral Daily 06/26/12 0901     06/24/12 1200  levofloxacin  (LEVAQUIN) IVPB 750 mg  Status:  Discontinued    Comments:  Not a true allergy, causes reflux   750 mg 100 mL/hr over 90 Minutes Intravenous Every 24 hours 06/24/12 1123 06/26/12 0901   06/24/12 0200  clindamycin (CLEOCIN) IVPB 600 mg  Status:  Discontinued     600 mg 100 mL/hr over 30 Minutes Intravenous 3 times per day 06/24/12 0141 06/26/12 0901   06/23/12 2115  clindamycin (CLEOCIN) IVPB 600 mg     600 mg 100 mL/hr over 30 Minutes Intravenous  Once 06/23/12 2104 06/23/12 2236       Assessment/Plan 1. Cellulitis of right arm secondary to dog bite 2.  COPD 3.  RUL lobe lesion - has been followed by Dr. Sherene Sires for this area.  But by report, it appears she will need further evaluation. 4.  DVT proph - Lovenox 5.  Was smoking up until this acute illness.  Plan: 1. cx from yesterday are negative so far. 2. Continue IV abx as her cellulitis is improving,but still fairly significant and needs IV therapy.  On Levaquin and Vanc 3. Cont to follow.   LOS: 5 days    OSBORNE,KELLY E 06/28/2012, 9:47 AM Pager: [phone]  Agree with above.  Ovidio Kin, MD, [facility] Pager: [phone] Office phone:  [phone]

## 2012-06-28 NOTE — Progress Notes (Addendum)
Subjective: Teresa [name] is feeling better overall. Now on vancomycin and Levaquin. Levaquin is by mouth. Her culture from right arm is pending from yesterday. Her forearm is much less painful. Her chest CT scan is concerning for possible malignancy( vs PNA) and we will consult pulmonary. Dr. Sherene Sires has followed her in the past.  Objective: Weight change:   Intake/Output Summary (Last 24 hours) at 06/28/12 1249 Last data filed at 06/27/12 2100  Gross per 24 hour  Intake    240 ml  Output      0 ml  Net    240 ml   Filed Vitals:   06/27/12 2150 06/27/12 2209 06/28/12 0602 06/28/12 0726  BP: 125/89  126/71   Pulse: 91 90 93   Temp: 98 F (36.7 C)  97.7 F (36.5 C)   TempSrc:      Resp: 16 16 16    Height:      Weight:      SpO2: 100% 100% 100% 97%    General Appearance: Alert, cooperative, no distress, appears stated age Lungs: Relatively clear breath sounds in the right upper lobe Heart: Regular rate and rhythm, S1 and S2 normal, no murmur, rub or gallop Abdomen: Soft, non-tender, bowel sounds active all four quadrants, no masses, no organomegaly Extremities: Right forearm with swelling in standing but erythema is improving Neuro: Alert and nonfocal  Lab Results: Results for orders placed during the hospital encounter of 06/23/12 (from the past 48 hour(s))  BASIC METABOLIC PANEL     Status: Abnormal   Collection Time    06/27/12  5:45 AM      Result Value Range   Sodium 137  135 - 145 mEq/L   Potassium 3.6  3.5 - 5.1 mEq/L   Chloride 98  96 - 112 mEq/L   CO2 32  19 - 32 mEq/L   Glucose, Bld 97  70 - 99 mg/dL   BUN 6  6 - 23 mg/dL   Creatinine, Ser 6.04 (*) 0.50 - 1.10 mg/dL   Calcium 8.8  8.4 - 54.0 mg/dL   GFR calc non Af Amer >90  >90 mL/min   GFR calc Af Amer >90  >90 mL/min   Comment:            The eGFR has been calculated     using the CKD EPI equation.     This calculation has not been     validated in all clinical     situations.     eGFR's persistently      <90 mL/min signify     possible Chronic Kidney Disease.  SEDIMENTATION RATE     Status: Abnormal   Collection Time    06/27/12  5:45 AM      Result Value Range   Sed Rate 97 (*) 0 - 22 mm/hr  C-REACTIVE PROTEIN     Status: Abnormal   Collection Time    06/27/12  5:45 AM      Result Value Range   CRP 12.1 (*) <0.60 mg/dL  CBC     Status: Abnormal   Collection Time    06/27/12  5:45 AM      Result Value Range   WBC 7.5  4.0 - 10.5 K/uL   RBC 3.51 (*) 3.87 - 5.11 MIL/uL   Hemoglobin 10.6 (*) 12.0 - 15.0 g/dL   HCT 98.1 (*) 19.1 - 47.8 %   MCV 89.7  78.0 - 100.0 fL   MCH 30.2  26.0 -  34.0 pg   MCHC 33.7  30.0 - 36.0 g/dL   RDW 40.9  81.1 - 91.4 %   Platelets 192  150 - 400 K/uL  WOUND CULTURE     Status: None   Collection Time    06/27/12 10:26 AM      Result Value Range   Specimen Description WOUND FOREARM RIGHT     Special Requests NONE     Gram Stain       Value: RARE WBC PRESENT, PREDOMINANTLY PMN     NO SQUAMOUS EPITHELIAL CELLS SEEN     NO ORGANISMS SEEN   Culture NO GROWTH 1 DAY     Report Status PENDING      Studies/Results: Ct Chest W Contrast  06/27/2012  *RADIOLOGY REPORT*  Clinical Data: Fever  CT CHEST WITH CONTRAST  Technique:  Multidetector CT imaging of the chest was performed following the standard protocol during bolus administration of intravenous contrast.  Contrast: 80mL OMNIPAQUE IOHEXOL 300 MG/ML  SOLN  Comparison: 02/01/2012  Findings: Pulmonary parenchymal disease has progressed compared with the prior study.  Irregular opacities with a predilection for the upper lobes are characterized by a combination of ground-glass density and interlobular septal thickening.  It is most pronounced in the posterior segment of the right upper lobe.  There are scattered areas in the right upper lobe, right middle lobe, apical left upper lobe, and lingula.  Minimal disease in the dependent portion of both lower lobes.  Irregular pulmonary opacity with air bronchograms  towards the right apex on image 13 is slightly larger compared with prior imaging. It is 2.0 x 1.5 cm on image 13 on today's study.  Small bilateral pleural effusions have developed.  Mediastinal nodes have become more prominent.  They have increased in number and size.  10 mm prevascular node on image 20.  9 mm precarinal node on image 22.  10 mm subcarinal node on image 28. Sub centimeter paratracheal nodes towards the thoracic inlet.  Borderline enlarged right axillary nodes.  11 mm right axillary node on image 10.  No pneumothorax.  Atherosclerotic vascular calcifications are unchanged.  Images of the upper abdomen demonstrate fluid between the gallbladder and liver which is nonspecific.  11 mm short axis diameter celiac node on image 55.  IMPRESSION: There is been a marked progression and pulmonary parenchymal disease since prior studies.  This includes an irregular opacity towards the right apex which is enlarging and several areas of ground-glass opacity with interlobular septal thickening.  There is also associated increasing adenopathy as described.  Differential diagnosis includes inflammatory and neoplastic etiology. Infectious etiology should include opportunistic infections. Neoplastic etiology includes bronchoalveolar carcinoma and lymphoma.  Noninfectious inflammatory etiologies include organizing pneumonia, hypersensitivity pneumonitis, alveolar proteinosis. This is not a characteristic appearance for idiopathic pulmonary fibrosis or sarcoidosis.  PET CT or biopsy may be helpful.   Original Report Authenticated By: Jolaine Click, M.D.    Mr Humerus Right W Wo Contrast  06/26/2012  *RADIOLOGY REPORT*  Clinical Data: Dog bite with upper extremity cellulitis and possible abscess.  Draining wound.  MRI OF THE RIGHT HUMERUS WITHOUT AND WITH CONTRAST  Technique:  Multiplanar, multisequence MR imaging was performed both before and after administration of intravenous contrast.  Contrast:  10 ml Multihance   Comparison: None.  Findings: Abnormal subcutaneous edema and enhancement is observed around the elbow and tracking in the right upper arm nearly to the upper axillary fold level.  This edema and enhancement compatible with cellulitis  is more confluent distally and becomes less prominent proximally.  No osseous edema or significant deep fascial plane edema noted.  No abscess along the upper arm is observed.  On image 10 of series 12 there is some edema along the right lateral abdominal wall.  We also demonstrated band of signal hyperintensity in the right upper lobe on images 2-17 of series 12 suspicious for pneumonia, with patchy surrounding opacities.  IMPRESSION:  1.  Cellulitis in the right upper arm more striking in the vicinity of the elbow.  Although this extends up to the level of the axilla, it becomes less prominent in the upper portion of the upper arm. No abscess of the upper arm identified. 2.  Right upper lobe pneumonia. 3.  Abnormal edema in the posterolateral margin of the abdominal wall is observed, of uncertain significance.   Original Report Authenticated By: Gaylyn Rong, M.D.    Mr Forearm Right Wo/w Cm  06/26/2012  *RADIOLOGY REPORT*  Clinical Data: Fever.  Edema in the right arm.  Tenderness in the right arm.  Prior dog bite.  Drainage.  Assessment for deep abscess.  MRI OF THE RIGHT FOREARM WITHOUT AND WITH CONTRAST  Technique:  Multiplanar, multisequence MR imaging was performed both before and after administration of intravenous contrast.  Contrast:  10 ml Multihance  Comparison: None.  Findings: The patient was imaged with her arm by her side.  In the forearm, abnormal subcutaneous edema extends from the elbow all the way down to the wrist, including significant enhancement indicative of diffuse cellulitis of the forearm.  This is more confluent posteriorly in the proximal forearm as on images 5-13 of series 21, where there is significant phlegmon infiltrative fluid signal with poor  enhancement suggesting an incipient abscess formation, but with linear infiltrative configuration more characteristic of flight month and drainable abscess at this time.  Edema and enhancement tracks along the superficial fascial margin into a lesser extent along the anterior compartmental fascial planes.  No osteomyelitis observed.  No definite gas is visualized tracking in the soft tissues.  IMPRESSION:  1.  Abnormal diffuse cellulitis of the forearm, with confluent phlegmon posteriorly in the proximal forearm.  The configuration is currently more characteristic of prominent phlegmon than drainable abscess although given the poor enhancement along the proximal posterior forearm, incipient abscess is difficult to completely exclude. 2.  Low-level enhancement along fascial planes in the anterior compartment of the forearm.  This may reflect mild early fasciitis.   Original Report Authenticated By: Gaylyn Rong, M.D.    Medications: Scheduled Meds: . albuterol  2.5 mg Nebulization TID  . budesonide-formoterol  2 puff Inhalation BID  . docusate sodium  100 mg Oral BID  . enoxaparin (LOVENOX) injection  40 mg Subcutaneous Q24H  . levofloxacin  750 mg Oral Daily  . neomycin-bacitracin-polymyxin   Topical BID  . pantoprazole  40 mg Oral BID AC  . vancomycin  500 mg Intravenous Q12H   Continuous Infusions: . dextrose 5 % and 0.45% NaCl 100 mL/hr at 06/27/12 0755   PRN Meds:.albuterol, HYDROcodone-acetaminophen, ibuprofen, magnesium hydroxide, morphine injection, ondansetron (ZOFRAN) IV, ondansetron  Assessment/Plan:  #1 Dog bite with soft tissue infection: MRI shows likely early phlegmon and CCS have performed further bedside debridement of blistering area  --continue levaquin and vancomycin  --Appreciate ID following  --f/u cultures from [facility] office and yesterday from Gen Surg  #2 RUL progressive lesions:HIGHLY worrisome for malignancy - possibly simply PNA --Will consult [facility]  to consider another  bronchoscopy vs CVTS for Biopsy of these lesions - Dr. Shan Levans contacted and PCCM     to see  #3 COPD/tobacco abuse  #4 Aortic stenosis, moderate      LOS: 5 days   Pearla Dubonnet, MD 06/28/2012, 12:49 PM

## 2012-06-28 NOTE — Progress Notes (Signed)
ANTIBIOTIC CONSULT NOTE - FOLLOW UP  Pharmacy Consult for vancomycin Indication: soft tissue infection/cellulitis with suspected abscess and possible PNA  Labs:  Recent Labs  06/26/12 0937 06/27/12 0545  WBC 8.8 7.5  HGB 10.8* 10.6*  PLT 164 192  CREATININE  --  0.46*   Estimated Creatinine Clearance: 58.3 ml/min (by C-G formula based on Cr of 0.46).  Recent Labs  06/28/12 2215  VANCOTROUGH <5.0*     Microbiology: Recent Results (from the past 720 hour(s))  WOUND CULTURE     Status: None   Collection Time    06/27/12 10:26 AM      Result Value Range Status   Specimen Description WOUND FOREARM RIGHT   Final   Special Requests NONE   Final   Gram Stain     Final   Value: RARE WBC PRESENT, PREDOMINANTLY PMN     NO SQUAMOUS EPITHELIAL CELLS SEEN     NO ORGANISMS SEEN   Culture NO GROWTH 1 DAY   Final   Report Status PENDING   Incomplete    Anti-infectives   Start     Dose/Rate Route Frequency Ordered Stop   06/28/12 2330  vancomycin (VANCOCIN) IVPB 750 mg/150 ml premix     750 mg 150 mL/hr over 60 Minutes Intravenous 3 times per day 06/28/12 2329     06/26/12 1100  vancomycin (VANCOCIN) 500 mg in sodium chloride 0.9 % 100 mL IVPB  Status:  Discontinued     500 mg 100 mL/hr over 60 Minutes Intravenous Every 12 hours 06/26/12 1016 06/28/12 2328   06/26/12 1000  levofloxacin (LEVAQUIN) tablet 750 mg     750 mg Oral Daily 06/26/12 0901     06/24/12 1200  levofloxacin (LEVAQUIN) IVPB 750 mg  Status:  Discontinued    Comments:  Not a true allergy, causes reflux   750 mg 100 mL/hr over 90 Minutes Intravenous Every 24 hours 06/24/12 1123 06/26/12 0901   06/24/12 0200  clindamycin (CLEOCIN) IVPB 600 mg  Status:  Discontinued     600 mg 100 mL/hr over 30 Minutes Intravenous 3 times per day 06/24/12 0141 06/26/12 0901   06/23/12 2115  clindamycin (CLEOCIN) IVPB 600 mg     600 mg 100 mL/hr over 30 Minutes Intravenous  Once 06/23/12 2104 06/23/12 2236       Assessment: 62yo female with undetectable vancomycin level with initial dosing for soft tissue infection with worsening CXR.  Goal of Therapy:  Vancomycin trough level 15-20 mcg/ml  Plan:  Will change vanc to 750mg  IV Q8H for trough closer to goal though difficult to calculate given undetectable level; will obtain another trough with am labs on 4/28.  Vernard Gambles, PharmD, BCPS  06/28/2012,11:29 PM

## 2012-06-29 DIAGNOSIS — R911 Solitary pulmonary nodule: Secondary | ICD-10-CM | POA: Diagnosis present

## 2012-06-29 LAB — WOUND CULTURE: Culture: NO GROWTH

## 2012-06-29 MED ORDER — IBUPROFEN 600 MG PO TABS: 600.0000 mg | ORAL_TABLET | Freq: Three times a day (TID) | ORAL | Status: DC | PRN

## 2012-06-29 MED ORDER — LEVOFLOXACIN 750 MG PO TABS: 750.0000 mg | ORAL_TABLET | Freq: Every day | ORAL | Status: DC

## 2012-06-29 MED ORDER — CLINDAMYCIN HCL 300 MG PO CAPS: 300.0000 mg | ORAL_CAPSULE | Freq: Four times a day (QID) | ORAL | Status: DC

## 2012-06-29 NOTE — Consult Note (Signed)
Dictation #:  [id number]

## 2012-06-29 NOTE — Progress Notes (Signed)
Patient ID: Teresa [name], female   DOB: [birth year], 62 y.o.   MRN: [id number]    Subjective: Pt feels well.  Ready to go home.  Objective: Vital signs in last 24 hours: Temp:  [98 F (36.7 C)-98.4 F (36.9 C)] 98 F (36.7 C) (04/27 0641) Pulse Rate:  [64-97] 89 (04/27 0641) Resp:  [16] 16 (04/27 0641) BP: (125-139)/(61-69) 138/68 mmHg (04/27 0641) SpO2:  [91 %-98 %] 95 % (04/27 0834) Last BM Date: 06/23/12  Intake/Output from previous day: 04/26 0701 - 04/27 0700 In: 720 [P.O.:720] Out: -  Intake/Output this shift:    PE: Ext: cellulitis improving. Hyperemic still in forearm, but sign of improvement as opposed to worsening.  Some induration, but no fluctuance noted  Lab Results:   Recent Labs  06/26/12 0937 06/27/12 0545  WBC 8.8 7.5  HGB 10.8* 10.6*  HCT 30.9* 31.5*  PLT 164 192   BMET  Recent Labs  06/27/12 0545  NA 137  K 3.6  CL 98  CO2 32  GLUCOSE 97  BUN 6  CREATININE 0.46*  CALCIUM 8.8   PT/INR No results found for this basename: LABPROT, INR,  in the last 72 hours CMP     Component Value Date/Time   NA 137 06/27/2012 0545   K 3.6 06/27/2012 0545   CL 98 06/27/2012 0545   CO2 32 06/27/2012 0545   GLUCOSE 97 06/27/2012 0545   BUN 6 06/27/2012 0545   CREATININE 0.46* 06/27/2012 0545   CALCIUM 8.8 06/27/2012 0545   PROT 7.3 06/23/2012 1800   ALBUMIN 3.5 06/23/2012 1800   AST 22 06/23/2012 1800   ALT 13 06/23/2012 1800   ALKPHOS 63 06/23/2012 1800   BILITOT 0.5 06/23/2012 1800   GFRNONAA >90 06/27/2012 0545   GFRAA >90 06/27/2012 0545   Lipase  No results found for this basename: lipase   Studies/Results: No results found.  Anti-infectives: Anti-infectives   Start     Dose/Rate Route Frequency Ordered Stop   06/28/12 2330  vancomycin (VANCOCIN) IVPB 750 mg/150 ml premix     750 mg 150 mL/hr over 60 Minutes Intravenous 3 times per day 06/28/12 2329     06/26/12 1100  vancomycin (VANCOCIN) 500 mg in sodium chloride 0.9 % 100 mL IVPB  Status:   Discontinued     500 mg 100 mL/hr over 60 Minutes Intravenous Every 12 hours 06/26/12 1016 06/28/12 2328   06/26/12 1000  levofloxacin (LEVAQUIN) tablet 750 mg     750 mg Oral Daily 06/26/12 0901     06/24/12 1200  levofloxacin (LEVAQUIN) IVPB 750 mg  Status:  Discontinued    Comments:  Not a true allergy, causes reflux   750 mg 100 mL/hr over 90 Minutes Intravenous Every 24 hours 06/24/12 1123 06/26/12 0901   06/24/12 0200  clindamycin (CLEOCIN) IVPB 600 mg  Status:  Discontinued     600 mg 100 mL/hr over 30 Minutes Intravenous 3 times per day 06/24/12 0141 06/26/12 0901   06/23/12 2115  clindamycin (CLEOCIN) IVPB 600 mg     600 mg 100 mL/hr over 30 Minutes Intravenous  Once 06/23/12 2104 06/23/12 2236     Assessment/Plan 1. Cellulitis of right forearm from dog bite 2. COPD  3. RUL lobe lesion - has been followed by Dr. Sherene Sires for this area.   But by report, it appears she will need further evaluation -  To see Dr. Sherene Sires. 4. DVT proph - Lovenox  5. Was smoking up until  this acute illness.  Plan: 1. This is continuing to improve.  Suspect she could be switched to oral abx therapy soon.  Will have MD evaluate and see if he agrees.    LOS: 6 days   OSBORNE,KELLY E 06/29/2012, 9:08 AM Pager: [phone]  Agree with above.  Arm is better.  Oral antibiotics are appropriate.  I discussed with Dr. Jerelyn Scott.  There is no reason to follow up with our service unless she has more problems.  Ovidio Kin, MD, [facility] Surgery Pager: [phone] Office phone:  [phone]

## 2012-06-29 NOTE — Discharge Summary (Signed)
Physician Discharge Summary  NAME:Teresa [name]  NWG:[id number]  DOB: Oct 05, 1950   Admit date: 06/23/2012 Discharge date: 06/29/2012  Discharge Diagnoses:  Principal Problem:   Cellulitis - right forearm cellulitis secondary to dog bite, much improved on IV therapy.  Cultures negative thus far Active Problems:   Aortic stenosis   COPD (chronic obstructive pulmonary disease)   Dog bite of forearm - improving on IV vancomycin and Levaquin orally.  Discharge on clindamycin and Levaquin for 7 more days of therapy   Pneumonia - bilateral upper lobe pneumonia, symptomatically improving, follow up in one week   Solitary pulmonary nodule - followed by Dr. Sandrea Hughs - followup in 2 weeks   Tobacco dependence   Discharge Physical Exam:  General Appearance: Alert, cooperative, no distress, appears stated age  Weight change:   Intake/Output Summary (Last 24 hours) at 06/29/12 1121 Last data filed at 06/29/12 0930  Gross per 24 hour  Intake   1200 ml  Output      0 ml  Net   1200 ml   Filed Vitals:   06/28/12 2109 06/28/12 2135 06/29/12 0641 06/29/12 0834  BP: 125/69  138/68   Pulse: 64  89   Temp: 98.4 F (36.9 C)  98 F (36.7 C)   TempSrc:      Resp: 16  16   Height:      Weight:      SpO2: 93% 94% 97% 95%   General Appearance: Alert, cooperative, no distress, appears stated age  Lungs: Relatively clear breath sounds in the right upper lobe  Heart: Regular rate and rhythm, S1 and S2 normal, no murmur, rub or gallop  Abdomen: Soft, non-tender, bowel sounds active all four quadrants, no masses, no organomegaly  Extremities: Right forearm with improved and improving swelling in and erythema is improving.  No fluctuance  Neuro: Alert and nonfocal  Discharge Condition: Improved  Hospital Course:  Teresa [name] is a very pleasant 62 year old female who suffered a right forearm dog bite by her puppy 3 days prior to admission.  It quickly became swollen and erythematous and  painful and was draining frank pus.  She was admitted on April 21 started on IV antibiotics and seen in consultation by surgery who attempted to open the wound but there was no significant drainage.  Imaging studies revealed no abscess just a phlegmon.  Culture from right forearm for Gen. surgery is without growth after 2 days.  She was also found to have bilateral upper lobe infiltrates and a persistent right upper lobe nodule that has been followed in the past by Dr. Sandrea Hughs.  Symptomatically she has improved greatly and now on IV vancomycin and oral Levaquin and she was seen also in consultation by infectious disease who is recommending outpatient clindamycin for the cellulitis and Dr. Corky Sing consulted today for pulmonary critical care medicine, is recommending continuing Levaquin for the bilateral upper lobe infiltrates for 7 more days.  She smoked up until 1 week prior to admission.  She also has a history of GERD, hypercholesterolemia, seasonal allergies, and aortic valve stenosis  Things to follow up in the outpatient setting: In one week she will followup with me at [facility] to further assess cellulitis and right forearm and she has been instructed to contact Dr. Sandrea Hughs for appointment in 2 weeks to followup on pulmonary infiltrates and right upper lobe nodule  Consults:   General surgery - Dr. Ovidio Kin  Infectious disease - Dr. Leodis Liverpool                     Pulmonary critical care medicine - Dr. Marcelyn Bruins  Disposition: 01-Home or Self Care  Discharge Orders   Future Orders Complete By Expires     Call MD for:  difficulty breathing, headache or visual disturbances  As directed     Call MD for:  redness, tenderness, or signs of infection (pain, swelling, redness, odor or green/yellow discharge around incision site)  As directed     Call MD for:  severe uncontrolled pain  As directed     Call MD for:  temperature  >100.4  As directed     Diet - low sodium heart healthy  As directed     Increase activity slowly  As directed         Medication List    TAKE these medications       BLACK COHOSH MENOPAUSE COMPLEX Tabs  Take 1 tablet by mouth daily.     clindamycin 300 MG capsule  Commonly known as:  CLEOCIN  Take 1 capsule (300 mg total) by mouth 4 (four) times daily.     Co Q 10 100 MG Caps  Take 1 capsule by mouth daily.     ibuprofen 600 MG tablet  Commonly known as:  ADVIL,MOTRIN  Take 1 tablet (600 mg total) by mouth every 8 (eight) hours as needed for pain.     lansoprazole 30 MG capsule  Commonly known as:  PREVACID  Take 30 mg by mouth daily.     levofloxacin 750 MG tablet  Commonly known as:  LEVAQUIN  Take 1 tablet (750 mg total) by mouth daily.     PROVENTIL HFA 108 (90 BASE) MCG/ACT inhaler  Generic drug:  albuterol  Inhale 2 puffs into the lungs every 6 (six) hours as needed for shortness of breath.     rosuvastatin 10 MG tablet  Commonly known as:  CRESTOR  Take 10 mg by mouth every Monday, Wednesday, and Friday. 1 tablet at bedtime three times per wk         The results of significant diagnostics from this hospitalization (including imaging, microbiology, ancillary and laboratory) are listed below for reference.    Significant Diagnostic Studies: Dg Chest 1 View  06/25/2012  *RADIOLOGY REPORT*  Clinical Data: 62 year old female shortness of breath and wheezing.  CHEST - 1 VIEW  Comparison: Chest CT 02/01/2012 and earlier.  Findings: Upper lobe nodular and confluent airspace disease superimposed on chronic scarring on the right.  Lung bases appear stable.  Cardiac size and mediastinal contours are within normal limits.  Visualized tracheal air column is within normal limits. No pneumothorax or definite effusion.  IMPRESSION: Right upper lobe pneumonia superimposed on chronic lung disease. Recommend post-treatment radiographs to document resolution.   Original Report  Authenticated By: Erskine Speed, M.D.    Ct Chest W Contrast  06/27/2012  *RADIOLOGY REPORT*  Clinical Data: Fever  CT CHEST WITH CONTRAST  Technique:  Multidetector CT imaging of the chest was performed following the standard protocol during bolus administration of intravenous contrast.  Contrast: 80mL OMNIPAQUE IOHEXOL 300 MG/ML  SOLN  Comparison: 02/01/2012  Findings: Pulmonary parenchymal disease has progressed compared with the prior study.  Irregular opacities with a predilection for the upper lobes are characterized by a combination of ground-glass density and interlobular septal thickening.  It is most pronounced in the posterior segment of  the right upper lobe.  There are scattered areas in the right upper lobe, right middle lobe, apical left upper lobe, and lingula.  Minimal disease in the dependent portion of both lower lobes.  Irregular pulmonary opacity with air bronchograms towards the right apex on image 13 is slightly larger compared with prior imaging. It is 2.0 x 1.5 cm on image 13 on today's study.  Small bilateral pleural effusions have developed.  Mediastinal nodes have become more prominent.  They have increased in number and size.  10 mm prevascular node on image 20.  9 mm precarinal node on image 22.  10 mm subcarinal node on image 28. Sub centimeter paratracheal nodes towards the thoracic inlet.  Borderline enlarged right axillary nodes.  11 mm right axillary node on image 10.  No pneumothorax.  Atherosclerotic vascular calcifications are unchanged.  Images of the upper abdomen demonstrate fluid between the gallbladder and liver which is nonspecific.  11 mm short axis diameter celiac node on image 55.  IMPRESSION: There is been a marked progression and pulmonary parenchymal disease since prior studies.  This includes an irregular opacity towards the right apex which is enlarging and several areas of ground-glass opacity with interlobular septal thickening.  There is also associated  increasing adenopathy as described.  Differential diagnosis includes inflammatory and neoplastic etiology. Infectious etiology should include opportunistic infections. Neoplastic etiology includes bronchoalveolar carcinoma and lymphoma.  Noninfectious inflammatory etiologies include organizing pneumonia, hypersensitivity pneumonitis, alveolar proteinosis. This is not a characteristic appearance for idiopathic pulmonary fibrosis or sarcoidosis.  PET CT or biopsy may be helpful.   Original Report Authenticated By: Jolaine Click, M.D.    Mr Humerus Right W Wo Contrast  06/26/2012  *RADIOLOGY REPORT*  Clinical Data: Dog bite with upper extremity cellulitis and possible abscess.  Draining wound.  MRI OF THE RIGHT HUMERUS WITHOUT AND WITH CONTRAST  Technique:  Multiplanar, multisequence MR imaging was performed both before and after administration of intravenous contrast.  Contrast:  10 ml Multihance  Comparison: None.  Findings: Abnormal subcutaneous edema and enhancement is observed around the elbow and tracking in the right upper arm nearly to the upper axillary fold level.  This edema and enhancement compatible with cellulitis is more confluent distally and becomes less prominent proximally.  No osseous edema or significant deep fascial plane edema noted.  No abscess along the upper arm is observed.  On image 10 of series 12 there is some edema along the right lateral abdominal wall.  We also demonstrated band of signal hyperintensity in the right upper lobe on images 2-17 of series 12 suspicious for pneumonia, with patchy surrounding opacities.  IMPRESSION:  1.  Cellulitis in the right upper arm more striking in the vicinity of the elbow.  Although this extends up to the level of the axilla, it becomes less prominent in the upper portion of the upper arm. No abscess of the upper arm identified. 2.  Right upper lobe pneumonia. 3.  Abnormal edema in the posterolateral margin of the abdominal wall is observed, of  uncertain significance.   Original Report Authenticated By: Gaylyn Rong, M.D.    Mr Forearm Right Wo/w Cm  06/26/2012  *RADIOLOGY REPORT*  Clinical Data: Fever.  Edema in the right arm.  Tenderness in the right arm.  Prior dog bite.  Drainage.  Assessment for deep abscess.  MRI OF THE RIGHT FOREARM WITHOUT AND WITH CONTRAST  Technique:  Multiplanar, multisequence MR imaging was performed both before and after administration of intravenous  contrast.  Contrast:  10 ml Multihance  Comparison: None.  Findings: The patient was imaged with her arm by her side.  In the forearm, abnormal subcutaneous edema extends from the elbow all the way down to the wrist, including significant enhancement indicative of diffuse cellulitis of the forearm.  This is more confluent posteriorly in the proximal forearm as on images 5-13 of series 21, where there is significant phlegmon infiltrative fluid signal with poor enhancement suggesting an incipient abscess formation, but with linear infiltrative configuration more characteristic of flight month and drainable abscess at this time.  Edema and enhancement tracks along the superficial fascial margin into a lesser extent along the anterior compartmental fascial planes.  No osteomyelitis observed.  No definite gas is visualized tracking in the soft tissues.  IMPRESSION:  1.  Abnormal diffuse cellulitis of the forearm, with confluent phlegmon posteriorly in the proximal forearm.  The configuration is currently more characteristic of prominent phlegmon than drainable abscess although given the poor enhancement along the proximal posterior forearm, incipient abscess is difficult to completely exclude. 2.  Low-level enhancement along fascial planes in the anterior compartment of the forearm.  This may reflect mild early fasciitis.   Original Report Authenticated By: Gaylyn Rong, M.D.     Microbiology: Recent Results (from the past 240 hour(s))  WOUND CULTURE     Status: None    Collection Time    06/27/12 10:26 AM      Result Value Range Status   Specimen Description WOUND FOREARM RIGHT   Final   Special Requests NONE   Final   Gram Stain     Final   Value: RARE WBC PRESENT, PREDOMINANTLY PMN     NO SQUAMOUS EPITHELIAL CELLS SEEN     NO ORGANISMS SEEN   Culture NO GROWTH 2 DAYS   Final   Report Status 06/29/2012 FINAL   Final     Labs: Results for orders placed during the hospital encounter of 06/23/12  WOUND CULTURE      Result Value Range   Specimen Description WOUND FOREARM RIGHT     Special Requests NONE     Gram Stain       Value: RARE WBC PRESENT, PREDOMINANTLY PMN     NO SQUAMOUS EPITHELIAL CELLS SEEN     NO ORGANISMS SEEN   Culture NO GROWTH 2 DAYS     Report Status 06/29/2012 FINAL    CBC WITH DIFFERENTIAL      Result Value Range   WBC 15.1 (*) 4.0 - 10.5 K/uL   RBC 4.77  3.87 - 5.11 MIL/uL   Hemoglobin 15.0  12.0 - 15.0 g/dL   HCT 54.0  98.1 - 19.1 %   MCV 89.5  78.0 - 100.0 fL   MCH 31.4  26.0 - 34.0 pg   MCHC 35.1  30.0 - 36.0 g/dL   RDW 47.8  29.5 - 62.1 %   Platelets 198  150 - 400 K/uL   Neutrophils Relative 84 (*) 43 - 77 %   Neutro Abs 12.8 (*) 1.7 - 7.7 K/uL   Lymphocytes Relative 9 (*) 12 - 46 %   Lymphs Abs 1.3  0.7 - 4.0 K/uL   Monocytes Relative 7  3 - 12 %   Monocytes Absolute 1.1 (*) 0.1 - 1.0 K/uL   Eosinophils Relative 0  0 - 5 %   Eosinophils Absolute 0.0  0.0 - 0.7 K/uL   Basophils Relative 0  0 - 1 %  Basophils Absolute 0.0  0.0 - 0.1 K/uL  COMPREHENSIVE METABOLIC PANEL      Result Value Range   Sodium 132 (*) 135 - 145 mEq/L   Potassium 3.7  3.5 - 5.1 mEq/L   Chloride 96  96 - 112 mEq/L   CO2 25  19 - 32 mEq/L   Glucose, Bld 133 (*) 70 - 99 mg/dL   BUN 12  6 - 23 mg/dL   Creatinine, Ser 1.61  0.50 - 1.10 mg/dL   Calcium 9.3  8.4 - 09.6 mg/dL   Total Protein 7.3  6.0 - 8.3 g/dL   Albumin 3.5  3.5 - 5.2 g/dL   AST 22  0 - 37 U/L   ALT 13  0 - 35 U/L   Alkaline Phosphatase 63  39 - 117 U/L   Total  Bilirubin 0.5  0.3 - 1.2 mg/dL   GFR calc non Af Amer >90  >90 mL/min   GFR calc Af Amer >90  >90 mL/min  CBC      Result Value Range   WBC 9.8  4.0 - 10.5 K/uL   RBC 2.99 (*) 3.87 - 5.11 MIL/uL   Hemoglobin 9.2 (*) 12.0 - 15.0 g/dL   HCT 04.5 (*) 40.9 - 81.1 %   MCV 89.3  78.0 - 100.0 fL   MCH 30.8  26.0 - 34.0 pg   MCHC 34.5  30.0 - 36.0 g/dL   RDW 91.4  78.2 - 95.6 %   Platelets 123 (*) 150 - 400 K/uL  BASIC METABOLIC PANEL      Result Value Range   Sodium 134 (*) 135 - 145 mEq/L   Potassium 3.5  3.5 - 5.1 mEq/L   Chloride 104  96 - 112 mEq/L   CO2 22  19 - 32 mEq/L   Glucose, Bld 105 (*) 70 - 99 mg/dL   BUN 11  6 - 23 mg/dL   Creatinine, Ser 2.13  0.50 - 1.10 mg/dL   Calcium 8.2 (*) 8.4 - 10.5 mg/dL   GFR calc non Af Amer >90  >90 mL/min   GFR calc Af Amer >90  >90 mL/min  CBC WITH DIFFERENTIAL      Result Value Range   WBC 13.4 (*) 4.0 - 10.5 K/uL   RBC 3.70 (*) 3.87 - 5.11 MIL/uL   Hemoglobin 11.3 (*) 12.0 - 15.0 g/dL   HCT 08.6 (*) 57.8 - 46.9 %   MCV 90.0  78.0 - 100.0 fL   MCH 30.5  26.0 - 34.0 pg   MCHC 33.9  30.0 - 36.0 g/dL   RDW 62.9  52.8 - 41.3 %   Platelets 157  150 - 400 K/uL   Neutrophils Relative 81 (*) 43 - 77 %   Neutro Abs 10.8 (*) 1.7 - 7.7 K/uL   Lymphocytes Relative 13  12 - 46 %   Lymphs Abs 1.8  0.7 - 4.0 K/uL   Monocytes Relative 5  3 - 12 %   Monocytes Absolute 0.7  0.1 - 1.0 K/uL   Eosinophils Relative 0  0 - 5 %   Eosinophils Absolute 0.1  0.0 - 0.7 K/uL   Basophils Relative 0  0 - 1 %   Basophils Absolute 0.0  0.0 - 0.1 K/uL  CBC      Result Value Range   WBC 13.7 (*) 4.0 - 10.5 K/uL   RBC 3.97  3.87 - 5.11 MIL/uL   Hemoglobin 11.9 (*) 12.0 - 15.0  g/dL   HCT 16.1 (*) 09.6 - 04.5 %   MCV 90.4  78.0 - 100.0 fL   MCH 30.0  26.0 - 34.0 pg   MCHC 33.1  30.0 - 36.0 g/dL   RDW 40.9  81.1 - 91.4 %   Platelets 148 (*) 150 - 400 K/uL  HIV 1 RNA QUANT-NO REFLEX-BLD      Result Value Range   HIV 1 RNA Quant <20  <20 copies/mL   HIV1 RNA  Quant, Log <1.30  <1.30 log 10  CBC WITH DIFFERENTIAL      Result Value Range   WBC 8.8  4.0 - 10.5 K/uL   RBC 3.48 (*) 3.87 - 5.11 MIL/uL   Hemoglobin 10.8 (*) 12.0 - 15.0 g/dL   HCT 78.2 (*) 95.6 - 21.3 %   MCV 88.8  78.0 - 100.0 fL   MCH 31.0  26.0 - 34.0 pg   MCHC 35.0  30.0 - 36.0 g/dL   RDW 08.6  57.8 - 46.9 %   Platelets 164  150 - 400 K/uL   Neutrophils Relative 80 (*) 43 - 77 %   Neutro Abs 7.1  1.7 - 7.7 K/uL   Lymphocytes Relative 13  12 - 46 %   Lymphs Abs 1.1  0.7 - 4.0 K/uL   Monocytes Relative 7  3 - 12 %   Monocytes Absolute 0.6  0.1 - 1.0 K/uL   Eosinophils Relative 0  0 - 5 %   Eosinophils Absolute 0.0  0.0 - 0.7 K/uL   Basophils Relative 0  0 - 1 %   Basophils Absolute 0.0  0.0 - 0.1 K/uL  BASIC METABOLIC PANEL      Result Value Range   Sodium 137  135 - 145 mEq/L   Potassium 3.6  3.5 - 5.1 mEq/L   Chloride 98  96 - 112 mEq/L   CO2 32  19 - 32 mEq/L   Glucose, Bld 97  70 - 99 mg/dL   BUN 6  6 - 23 mg/dL   Creatinine, Ser 6.29 (*) 0.50 - 1.10 mg/dL   Calcium 8.8  8.4 - 52.8 mg/dL   GFR calc non Af Amer >90  >90 mL/min   GFR calc Af Amer >90  >90 mL/min  SEDIMENTATION RATE      Result Value Range   Sed Rate 97 (*) 0 - 22 mm/hr  C-REACTIVE PROTEIN      Result Value Range   CRP 12.1 (*) <0.60 mg/dL  CBC      Result Value Range   WBC 7.5  4.0 - 10.5 K/uL   RBC 3.51 (*) 3.87 - 5.11 MIL/uL   Hemoglobin 10.6 (*) 12.0 - 15.0 g/dL   HCT 41.3 (*) 24.4 - 01.0 %   MCV 89.7  78.0 - 100.0 fL   MCH 30.2  26.0 - 34.0 pg   MCHC 33.7  30.0 - 36.0 g/dL   RDW 27.2  53.6 - 64.4 %   Platelets 192  150 - 400 K/uL  VANCOMYCIN, TROUGH      Result Value Range   Vancomycin Tr <5.0 (*) 10.0 - 20.0 ug/mL    Time coordinating discharge: 38 minutes  Signed: Pearla Dubonnet, MD 06/29/2012, 11:21 AM

## 2012-06-30 NOTE — Consult Note (Signed)
[name], [name] NO.:  [id number]  MEDICAL RECORD NO.:  [id number]  LOCATION:  5N05C                        FACILITY:  [facility]  PHYSICIAN:  Barbaraann Share, MD,FCCPDATE OF BIRTH:  [birth year]10[birth year]1952  DATE OF CONSULTATION:  06/29/2012 DATE OF DISCHARGE:  06/29/2012                                CONSULTATION   HISTORY OF PRESENT ILLNESS:  The patient is a 62 year old female, well known to our practice with moderate-to-severe airflow obstruction felt to be secondary to emphysema and possibly ongoing airway inflammation related to smoking.  She also has a right upper lobe nodular density that has been followed, and has had bronchoscopy that was unremarkable last year.  This is being followed radiographically.  The patient was admitted to the hospital on June 23, 2012, with a dog bite that became infected, and she was felt to need IV antibiotics for cellulitis.  While here, she has had a followup CT chest that shows a slight increase in the nodular density in the right upper lobe, but there is new ground glass opacity with septal thickening in the posterior right upper lobe and also patchy densities throughout both lung fields.  There were very small pleural effusions, and minimal mediastinal lymphadenopathy.  The patient states that she had some increased cough and shortness of breath starting about 2 weeks ago, but has not coughed up purulent mucus.  She has not had any fevers, chills, or sweats.  She states that since she has been in the hospital, her breathing has gotten significantly better, as well as her cough.  She is still not bringing up purulent mucus.  She has been receiving IV vancomycin and Levaquin since being here.  Of note, her saturations have been adequate on room air.  She has also been noted to have a sed rate of 97 during this hospitalization.  PAST MEDICAL HISTORY:  Significant for her, 1. Known COPD. 2. History of moderate aortic stenosis. 3.  History of dyslipidemia. 4. History of allergies. 5. History of arthritis. 6. History of GERD.  SOCIAL HISTORY:  She has a long history of smoking and continued to do so until approximately 1 week ago.  She denies alcohol use.  FAMILY HISTORY:  Remarkable only for alcohol abuse.  REVIEW OF SYSTEMS:  A 14-point review of systems was unremarkable except for that listed in the history of present illness.  PHYSICAL EXAMINATION:  GENERAL:  She is an overweight female, in no acute distress. VITAL SIGNS:  Blood pressure 138/68, pulse 89, respiratory rate is 15. She is afebrile.  O2 saturation on room air is 97%. HEENT:  Pupils equal, round and reactive to light and accommodation. Extraocular muscles are intact.  Nares are patent without discharge. Oropharynx is clear. NECK:  Supple without JVD or lymphadenopathy. CHEST:  Surprisingly clear with a few rhonchi.  There is no wheezing. CARDIAC:  With regular rate and rhythm, 2/6 systolic murmur. ABDOMEN:  Soft, nontender, nondistended with good bowel sounds. GENITAL:  Not done and not indicated. RECTAL:  Not done and not indicated. BREASTS:  Not done and not indicated. EXTREMITIES:  Lower extremities with trace edema.  No cyanosis noted. NEUROLOGICAL:  She is alert and  oriented with no obvious motor deficit.  IMPRESSION: 1. Right upper lobe nodular density, which appears to be slightly     larger than her prior scan last year.  It is unclear if this is     inflammatory in nature or possibly an indolent bronchogenic cancer.     This will need to be followed up with her regular pulmonologist as     an outpatient and consideration given the possible PET scanning. 2. Ground glass opacities noted in the posterior aspect of the right     upper lobe and also patchy densities bilaterally of unknown     etiology.  This is new from her scan last year.  It is unclear     whether this represents an infectious process or possibly something      inflammatory, such as BOOP.  Her sed rate was 97; however, this     could simply be related to her ongoing cellulitis from her dog     bite.  She has also noticed a significant improvement in her     breathing and cough since being in the hospital on antibiotics.     This would lend some credence to the possibility of pulmonary     infection.  From my standpoint, she is stable for discharge, but     will need close followup as an outpatient in the pulmonary clinic.     This will be arranged.     Barbaraann Share, MD,FCCP     [facility]MED[facility]  D:  06/29/2012  T:  06/30/2012  Job:  [id number]  cc:   Charlaine Dalton. Sherene Sires, MD, FCCP

## 2012-07-14 ENCOUNTER — Encounter: Payer: Self-pay | Admitting: Internal Medicine

## 2012-07-14 ENCOUNTER — Ambulatory Visit (INDEPENDENT_AMBULATORY_CARE_PROVIDER_SITE_OTHER): Payer: BC Managed Care – PPO | Admitting: Internal Medicine

## 2012-07-14 VITALS — BP 120/82 | HR 83 | Temp 98.7°F | Ht 59.5 in | Wt 123.0 lb

## 2012-07-14 DIAGNOSIS — R918 Other nonspecific abnormal finding of lung field: Secondary | ICD-10-CM

## 2012-07-14 DIAGNOSIS — J449 Chronic obstructive pulmonary disease, unspecified: Secondary | ICD-10-CM

## 2012-07-14 NOTE — Patient Instructions (Addendum)
Please see patient coordinator before you leave today  to schedule CT chest the week of father's day  Only use your albuterol(proaire)  as a rescue medication to be used if you can't catch your breath by resting or doing a relaxed purse lip breathing pattern. The less you use it, the better it will work when you need it.

## 2012-07-14 NOTE — Assessment & Plan Note (Addendum)
-   PFT's 11/07/2011 FEV1  0.96 (51%) ratio 50 and no better p B2, DLCO 78% - s/p smoking cessation 06/2012   I reviewed the Flethcher curve with patient that basically indicates  if you quit smoking when your best day FEV1 is still well preserved (which hers is) it is highly unlikely you will progress to severe disease and informed the patient there was no medication on the market that has proven to change the curve or the likelihood of progression.  Therefore stopping smoking and maintaining abstinence is the most important aspect of care, not choice of inhalers or for that matter, doctors.

## 2012-07-14 NOTE — Assessment & Plan Note (Signed)
-   CT Chest 08/20/11 1. Increased nodularity of a right upper lobe parenchymal opacity  seen on the prior study is nonspecific and could be post infectious  although neoplasm could create a similar appearance. Short-term  follow-up chest CT in 3 months for PET CT scan recommend for  further evaluation.  2. No change in a small ground-glass nodular opacity in the left  upper lobe. - 09/12/2011 FOB with tbbx RUL > no lesions, not able to access R apex for TBBX - CT 02/01/2012 > overall improved > rec recheck June 2014

## 2012-07-14 NOTE — Progress Notes (Signed)
Subjective:    Patient ID: Teresa [name], female    DOB: 1950/12/14   MRN: [id number]     Brief patient profile:  38 yowf active smoker referred 08/24/2011 to pulmonary clinic  by Dr Earl Gala for evaluation of spn   HPI 08/24/2011 1st pulmonary ov cc acute onset chest congestion Dec 2012 > cxr with RUL nodule > ct Pos SPN but all symptoms resolved with no hemoptysis or R CP and good ex tol in between flares of bronchitis maybe once a year and rare need for saba hfa.  In meantime repeat CT Chest c/w evolving increase in density RUL lesion so referred to pulmonary. Has dx of mod to severe AS with most recent echo done 2 year prior to OV  But yearly eval due w/in a week.  No ex cp or presyncope, denies any limiting sob with desired activities. rec You have a small nodule in right upper lobe that could be a atypical infection or an early tumor and best way to sort it out would be with a biopsy but I would do your physical with Dr Earl Gala 1st. Tentatively we'll schedule your bronchoscopy for September 12 2011   11/07/2011 f/u ov/Zakyria Metzinger cc breathing much worse x one week some better since rx 9/2 with prednisone and saba, no hemoptysis.    rec    The key is to stop smoking completely before smoking completely stops you- it's not too late!   CT chest limited to Right upper lobe 1st week in December 2013 > - CT 02/01/2012 > overall improved > rec recheck in 6 months    07/14/2012 f/u ov/Caeley Dohrmann post hosp f/u p developing ? Cellulitis/sepsis/resp failure Chief Complaint  Patient presents with  . Follow-up    Recent dx of PNA on 06/23/12- discharged from hospital on 06/29/12. She states that she is feeling much better since d/c with no SOB, cough, or wheeze.    back to baseline doe s saba use  No obvious daytime variabilty or assoc chronic cough or cp or chest tightness, subjective wheeze overt sinus or hb symptoms. No unusual exp hx or h/o childhood pna/ asthma or premature birth to her knowledge.     Sleeping ok without nocturnal  or early am exacerbation  of respiratory  c/o's or need for noct saba. Also denies any obvious fluctuation of symptoms with weather or environmental changes or other aggravating or alleviating factors except as outlined above   ROS  The following are not active complaints unless bolded sore throat, dysphagia, dental problems, itching, sneezing,  nasal congestion or excess/ purulent secretions, ear ache,   fever, chills, sweats, unintended wt loss, pleuritic or exertional cp, hemoptysis,  orthopnea pnd or leg swelling, presyncope, palpitations, heartburn, abdominal pain, anorexia, nausea, vomiting, diarrhea  or change in bowel or urinary habits, change in stools or urine, dysuria,hematuria,  rash, arthralgias, visual complaints, headache, numbness weakness or ataxia or problems with walking or coordination,  change in mood/affect or memory.             Objective:   Physical Exam  Chronically ill amb wf nad with nl vital signs  Wt 125  08/24/11 >  11/07/2011  122> 07/14/2012 123   HEENT: nl dentition, turbinates, and orophanx. Nl external ear canals without cough reflex   NECK :  without JVD/Nodes/TM/ nl carotid upstrokes bilaterally   LUNGS: no acc muscle use, clear to A and P bilaterally without cough on insp or exp maneuvers   CV:  RRR  II - III/VI  Sem no def increase in P2  ABD:  soft and nontender with nl excursion in the supine position. No bruits or organomegaly, bowel sounds nl  MS:  warm without deformities, calf tenderness, cyanosis or clubbing- R forearm with 4x4 cm sts swelling/purplish discoloration, no pus or erythema  SKIN: warm and dry without lesions       cxr 07/03/12 > clear         Assessment & Plan:

## 2012-08-18 ENCOUNTER — Other Ambulatory Visit: Payer: Self-pay | Admitting: Gynecology

## 2012-08-18 ENCOUNTER — Other Ambulatory Visit: Payer: Self-pay | Admitting: Internal Medicine

## 2012-08-18 DIAGNOSIS — R918 Other nonspecific abnormal finding of lung field: Secondary | ICD-10-CM

## 2012-08-19 ENCOUNTER — Inpatient Hospital Stay: Admission: RE | Admit: 2012-08-19 | Payer: BC Managed Care – PPO | Source: Ambulatory Visit

## 2012-08-19 ENCOUNTER — Ambulatory Visit (INDEPENDENT_AMBULATORY_CARE_PROVIDER_SITE_OTHER)
Admission: RE | Admit: 2012-08-19 | Discharge: 2012-08-19 | Disposition: A | Discharge status: Home / Self Care | Payer: BC Managed Care – PPO | Source: Ambulatory Visit | Attending: Internal Medicine | Admitting: Internal Medicine

## 2012-08-19 ENCOUNTER — Encounter: Payer: Self-pay | Admitting: Internal Medicine

## 2012-08-19 DIAGNOSIS — R05 Cough: Secondary | ICD-10-CM | POA: Insufficient documentation

## 2012-08-19 DIAGNOSIS — R059 Cough, unspecified: Secondary | ICD-10-CM | POA: Insufficient documentation

## 2012-08-19 DIAGNOSIS — R918 Other nonspecific abnormal finding of lung field: Secondary | ICD-10-CM

## 2012-08-20 ENCOUNTER — Other Ambulatory Visit: Payer: Self-pay | Admitting: Internal Medicine

## 2012-08-20 DIAGNOSIS — R918 Other nonspecific abnormal finding of lung field: Secondary | ICD-10-CM

## 2012-08-22 ENCOUNTER — Encounter: Payer: Self-pay | Admitting: Internal Medicine

## 2012-08-22 ENCOUNTER — Encounter (HOSPITAL_COMMUNITY)
Admission: RE | Admit: 2012-08-22 | Discharge: 2012-08-22 | Disposition: A | Discharge status: Home / Self Care | Payer: BC Managed Care – PPO | Source: Ambulatory Visit | Attending: Internal Medicine | Admitting: Internal Medicine

## 2012-08-22 ENCOUNTER — Encounter (HOSPITAL_COMMUNITY): Payer: Self-pay

## 2012-08-22 ENCOUNTER — Telehealth: Payer: Self-pay | Admitting: Internal Medicine

## 2012-08-22 DIAGNOSIS — I700 Atherosclerosis of aorta: Secondary | ICD-10-CM | POA: Insufficient documentation

## 2012-08-22 DIAGNOSIS — R918 Other nonspecific abnormal finding of lung field: Secondary | ICD-10-CM

## 2012-08-22 LAB — GLUCOSE, CAPILLARY: Glucose-Capillary: 98 mg/dL (ref 70–99)

## 2012-08-22 MED ORDER — FLUDEOXYGLUCOSE F - 18 (FDG) INJECTION
16.7000 | Freq: Once | INTRAVENOUS | Status: AC | PRN
  Administered 2012-08-22: 16.7 via INTRAVENOUS

## 2012-08-22 NOTE — Telephone Encounter (Signed)
I spoke with pt and she thought she had missed call from [place] regarding PET but I do not see anything. Please advise MW thanks

## 2012-08-22 NOTE — Telephone Encounter (Signed)
Discussed - see PET result

## 2012-08-26 ENCOUNTER — Other Ambulatory Visit: Payer: Self-pay | Admitting: Internal Medicine

## 2012-08-26 DIAGNOSIS — R918 Other nonspecific abnormal finding of lung field: Secondary | ICD-10-CM

## 2012-08-26 NOTE — Progress Notes (Signed)
Quick Note:  Referral was sent to [facility] ______ 

## 2012-08-27 ENCOUNTER — Other Ambulatory Visit: Payer: Self-pay | Admitting: *Deleted

## 2012-08-28 ENCOUNTER — Institutional Professional Consult (permissible substitution) (INDEPENDENT_AMBULATORY_CARE_PROVIDER_SITE_OTHER): Payer: BC Managed Care – PPO | Admitting: Surgery

## 2012-08-28 ENCOUNTER — Encounter: Payer: Self-pay | Admitting: Surgery

## 2012-08-28 VITALS — BP 130/78 | HR 102 | Resp 16 | Ht 59.5 in | Wt 127.0 lb

## 2012-08-28 DIAGNOSIS — R222 Localized swelling, mass and lump, trunk: Secondary | ICD-10-CM

## 2012-08-28 DIAGNOSIS — R918 Other nonspecific abnormal finding of lung field: Secondary | ICD-10-CM

## 2012-08-29 ENCOUNTER — Other Ambulatory Visit: Payer: Self-pay | Admitting: *Deleted

## 2012-08-29 ENCOUNTER — Encounter: Payer: Self-pay | Admitting: *Deleted

## 2012-08-29 ENCOUNTER — Encounter: Payer: Self-pay | Admitting: Surgery

## 2012-08-29 DIAGNOSIS — R918 Other nonspecific abnormal finding of lung field: Secondary | ICD-10-CM

## 2012-08-29 NOTE — Progress Notes (Signed)
[address]       Teresa [name] [address]             [phone]         PCP is Darnelle Bos, MD Referring Provider is Nyoka Cowden, MD  Chief Complaint  Patient presents with  . Lung Mass    eval for biopsy    HPI:  The patient is a 62 year old smoker with moderate to severe air flow obstruction felt secondary to emphysema and ongoing airway inflammation related to smoking who has been followed by pulmonary medicine since June 2013. She had a right upper lobe nodular density noted on chest x-ray that has been followed with serial CT scans. She was admitted in April 2014 after a dog bite in her right arm causing cellulitis and a followup CT scan showed the right upper lobe nodular density to be slightly larger than on her scan last year. She also had groundglass opacities noted in the posterior aspect of the right upper lobe and patchy densities bilaterally of unknown etiology. She had  some recent increase cough and shortness of breath starting about 2 weeks prior. She was discharged and had a outpatient PET scan which showed the irregular right apical density to have a maximum SUV of 4.3 concerning for bronchogenic carcinoma. There were no other areas of hypermetabolism noted on the scan. She had pulmonary function testing done around this time which showed an FEV1 of 0.96 which was 51% predicted with no improvement with bronchodilator. Diffusion capacity was 78%. She said that at the time the pulmonary function tests were done she was having significant pulmonary symptoms that she felt were an exacerbation of her emphysema. She has continued to smoke about 5 cigarettes per day but says here breathing is back to baseline.  Past Medical History  Diagnosis Date  . GERD (gastroesophageal reflux disease)   . Hypercholesterolemia   . Seasonal allergies   . Tobacco abuse   . Cataract     "? right" (06/24/2012)  . Aortic valve stenosis, moderate   . Heart  murmur   . Mass of lung     "right spot; they are watching it" (06/24/2012)  . Cellulitis 06/20/2012    "dog bite; right forearm" (06/24/2012)  . Arthritis     "across my hips; comes w/the weather" (06/24/2012)    Past Surgical History  Procedure Laterality Date  . Mouth surgery  2010?    "for bone loss" (06/24/2012)  . Video bronchoscopy  09/12/2011    Procedure: VIDEO BRONCHOSCOPY WITH FLUORO;  Surgeon: Nyoka Cowden, MD;  Location: [facility];  Service: Cardiopulmonary;  Laterality: Bilateral;  . Tonsillectomy  1960  . Ganglion cyst excision  1975    Family History  Problem Relation Age of Onset  . Alcohol abuse Father     Social History History  Substance Use Topics  . Smoking status: Former Smoker -- 0.50 packs/day for 40 years    Types: Cigarettes    Quit date: 06/23/2012  . Smokeless tobacco: Never Used  . Alcohol Use: No    Current Outpatient Prescriptions  Medication Sig Dispense Refill  . albuterol (PROVENTIL HFA) 108 (90 BASE) MCG/ACT inhaler Inhale 2 puffs into the lungs every 6 (six) hours as needed for shortness of breath.       . Cholecalciferol (VITAMIN D PO) Take 1 tablet by mouth daily.      . Coenzyme Q10 (CO Q 10) 100 MG CAPS  Take 1 capsule by mouth daily.      [name] ibuprofen (ADVIL,MOTRIN) 600 MG tablet Take 1 tablet (600 mg total) by mouth every 8 (eight) hours as needed for pain.  30 tablet  0  . lansoprazole (PREVACID) 30 MG capsule Take 30 mg by mouth daily.      . Misc Natural Products (BLACK COHOSH MENOPAUSE COMPLEX) TABS Take 1 tablet by mouth daily.      . rosuvastatin (CRESTOR) 10 MG tablet Take 10 mg by mouth every Monday, Wednesday, and Friday. 1 tablet at bedtime three times per wk       No current facility-administered medications for this visit.    Allergies  Allergen Reactions  . Azithromycin Shortness Of Breath  . Ceclor (Cefaclor) Shortness Of Breath  . Morphine And Related     Terrible headache  . Doxycycline Other (See Comments)      Redness on the face  . Penicillins Other (See Comments)    Unknown from childhood  . Vicodin (Hydrocodone-Acetaminophen) Nausea And Vomiting  . Septra (Sulfamethoxazole W-Trimethoprim) Rash    Review of Systems  Constitutional: Negative for fever, chills, activity change, appetite change, fatigue and unexpected weight change.  HENT: Negative.   Eyes: Negative.   Respiratory: Positive for shortness of breath and wheezing.        With exertion  Cardiovascular: Positive for leg swelling. Negative for chest pain and palpitations.  Gastrointestinal: Negative.   Endocrine: Negative.   Genitourinary: Negative.   Musculoskeletal: Positive for arthralgias.  Skin: Negative.   Allergic/Immunologic: Negative.   Neurological: Negative.   Hematological: Negative.   Psychiatric/Behavioral: Negative.     BP 130/78  Pulse 102  Resp 16  Ht 4' 11.5" (1.511 m)  Wt 127 lb (57.607 kg)  BMI 25.23 kg/m2  SpO2 95%  LMP 10/04/1999 Physical Exam  Constitutional: She is oriented to person, place, and time. She appears well-developed and well-nourished. No distress.  HENT:  Head: Normocephalic and atraumatic.  Mouth/Throat: Oropharynx is clear and moist.  Eyes: Conjunctivae and EOM are normal. Pupils are equal, round, and reactive to light.  Neck: Normal range of motion. Neck supple. No JVD present. No thyromegaly present.  Cardiovascular: Normal rate, regular rhythm and intact distal pulses.  Exam reveals no gallop and no friction rub.   Murmur heard. 3/6 systolic murmur over aorta  Pulmonary/Chest: Effort normal and breath sounds normal. No respiratory distress. She has no wheezes. She has no rales. She exhibits no tenderness.  Abdominal: Soft. Bowel sounds are normal. She exhibits no distension and no mass. There is no tenderness.  Musculoskeletal: Normal range of motion. She exhibits no edema.  Lymphadenopathy:    She has no cervical adenopathy.  Neurological: She is alert and oriented to  person, place, and time. She has normal strength. No cranial nerve deficit or sensory deficit.  Skin: Skin is warm and dry.  Psychiatric: She has a normal mood and affect.     Diagnostic Tests:  *RADIOLOGY REPORT*   Clinical Data: Initial treatment strategy for pulmonary nodules.   NUCLEAR MEDICINE PET SKULL BASE TO THIGH   Fasting Blood Glucose:  98   Technique:  16.7 mCi F-18 FDG was injected intravenously. CT data was obtained and used for attenuation correction and anatomic localization only.  (This was not acquired as a diagnostic CT examination.) Additional exam technical data entered on technologist worksheet.   Comparison:  CT chest dated 08/19/2012   Findings:   Neck: No hypermetabolic lymph nodes in  the neck.   Chest:  Irregular right apical opacity with 14 x 11 mm nodular component (series 2/image 49), max SUV 4.3, worrisome for primary bronchogenic neoplasm.   4-5 mm lingular nodule (series 2/image 68), unchanged from multiple priors.   Small mediastinal and axillary lymph nodes measuring up to 6-8 mm short axis, without associated hypermetabolism.   Abdomen/Pelvis:  No abnormal hypermetabolic activity within the liver, pancreas, adrenal glands, or spleen.   No hypermetabolic lymph nodes in the abdomen or pelvis.   Atherosclerotic calcifications of the abdominal aorta and branch vessels.   Skeleton:  No focal hypermetabolic activity to suggest skeletal metastasis.   IMPRESSION: Irregular right apical opacity, max SUV 4.3, worrisome for primary bronchogenic neoplasm.   No findings to suggest metastatic disease.     Original Report Authenticated By: Charline Bills, M.D.        *RADIOLOGY REPORT*   Clinical Data: Follow-up nodule noted November 2013.   CT CHEST WITHOUT CONTRAST   Technique:  Multidetector CT imaging of the chest was performed following the standard protocol without IV contrast.   Comparison: 06/27/2012, 02/01/2012,  08/20/2011 and 02/28/2011.   Findings: Since the most recent CT scan, there has been improvement with partial clearing of previously noted hazy parenchymal infiltrates throughout both lungs as well as clearing of previously noted small pleural effusions and basilar atelectasis.   The most worrisome residual abnormality is the right lung apical mass which has progressed in size over time.  Malignancy is a possibility versus progressive post inflammatory/infectious changes.   Slight improvement of the mediastinal and hilar/axillary adenopathy.  Largest residual mediastinal lymph node lower right pretracheal region measures 6.8 versus prior 8.8 mm maximal short axis dimension.   Prominent atherosclerotic type changes of the aorta and great vessels.  Focal bulge of the aortic arch unchanged.   Prominent coronary artery calcifications.  No cardiomegaly or pericardial effusion.   No bony destructive lesion.   Unenhanced imaging of the upper abdominal structures unremarkable.   IMPRESSION: Since the prior examination there has been clearing of portion of the pulmonary parenchymal changes which may have represented result of infectious infiltrate.  There does however remain mass-like irregular lesion in the right lung apex which could represent malignancy given the change over time.   Please see above.   This is a call report.     Original Report Authenticated By: Lacy Duverney, M.D.    Impression:  She has a 14 x 11 mm irregular right apical lesion that has been slightly increasing in size and becoming more dense. This has hypermetabolic activity on PET scan and suspicious for a primary bronchogenic carcinoma. I think that surgical removal would be ideal treatment but her recent pulmonary function testing in April showed an FEV1 of 0.96. Since she was having some pulmonary difficulties at that time it would be worthwhile repeating her pulmonary function testing. I discussed the  importance of smoking cessation with her and it has been discussed with her previously by Dr. Sherene Sires. If her pulmonary function is not good enough to tolerate a lobectomy she may tolerate a wedge resection. She also has a history of moderate to severe aortic stenosis by echocardiogram 2 years ago but I don't have the report from that study. She said that that study was ordered by Dr. Earl Gala who is her primary care provider. With this history and her heart murmur on exam she should have a cardiology evaluation preoperatively and repeat echocardiogram. I will set up an appointment with  Dr. Eldridge Dace. Once her pulmonary function testing and cardiology consultation are completed I will see her back to discuss the results with her and make the appropriate surgical plans.  Plan: 1. Pulmonary function testing with diffusion capacity. 2. Cardiology consultation with Dr. Eldridge Dace.  3. She has a followup appointment with Dr. Earl Gala on Monday.

## 2012-09-02 ENCOUNTER — Ambulatory Visit (HOSPITAL_COMMUNITY)
Admission: RE | Admit: 2012-09-02 | Discharge: 2012-09-02 | Disposition: A | Discharge status: Home / Self Care | Payer: BC Managed Care – PPO | Source: Ambulatory Visit | Attending: Surgery | Admitting: Surgery

## 2012-09-02 DIAGNOSIS — R918 Other nonspecific abnormal finding of lung field: Secondary | ICD-10-CM

## 2012-09-02 LAB — PULMONARY FUNCTION TEST

## 2012-09-02 MED ORDER — ALBUTEROL SULFATE (5 MG/ML) 0.5% IN NEBU
2.5000 mg | INHALATION_SOLUTION | Freq: Once | RESPIRATORY_TRACT | Status: AC
  Administered 2012-09-02: 2.5 mg via RESPIRATORY_TRACT

## 2012-09-03 ENCOUNTER — Telehealth: Payer: Self-pay | Admitting: *Deleted

## 2012-09-03 NOTE — Telephone Encounter (Signed)
I called patient to check in and see if she had any questions or needs at this time.  She reports that she has completed her PFTs and has seen her cardiologist who performed an ECG and has scheduled an ECHO cardiogram.  She reports that her cardiologist told her that she had had 2 silent heart attacks.   She reports that she feels well with minimal shortness of breath.  She denies any concerns.  I instructed her to call me as needed.

## 2012-09-17 ENCOUNTER — Ambulatory Visit: Payer: BC Managed Care – PPO | Admitting: Surgery

## 2012-09-18 ENCOUNTER — Telehealth: Payer: Self-pay | Admitting: *Deleted

## 2012-09-18 NOTE — Telephone Encounter (Signed)
I had called patient to check in.  Patient returned my call after returning from her  Cardiac Stress Test.  Patient reports that she tolerated the test well.  She also reports that she is doing well with no questions or concerns at this time.  We reviewed her upcoming appointment schedule.  Patient was encouraged to call me for any needs.  Patient verbalized understanding.

## 2012-09-22 ENCOUNTER — Other Ambulatory Visit: Payer: Self-pay | Admitting: Interventional Cardiology

## 2012-09-24 ENCOUNTER — Encounter (HOSPITAL_BASED_OUTPATIENT_CLINIC_OR_DEPARTMENT_OTHER)
Admission: RE | Disposition: A | Discharge status: Home / Self Care | Payer: Self-pay | Source: Ambulatory Visit | Attending: Interventional Cardiology

## 2012-09-24 ENCOUNTER — Inpatient Hospital Stay (HOSPITAL_BASED_OUTPATIENT_CLINIC_OR_DEPARTMENT_OTHER)
Admission: RE | Admit: 2012-09-24 | Discharge: 2012-09-24 | Disposition: A | Discharge status: Home / Self Care | Payer: BC Managed Care – PPO | Source: Ambulatory Visit | Attending: Interventional Cardiology | Admitting: Interventional Cardiology

## 2012-09-24 DIAGNOSIS — F17200 Nicotine dependence, unspecified, uncomplicated: Secondary | ICD-10-CM

## 2012-09-24 DIAGNOSIS — I714 Abdominal aortic aneurysm, without rupture, unspecified: Secondary | ICD-10-CM | POA: Insufficient documentation

## 2012-09-24 DIAGNOSIS — I2510 Atherosclerotic heart disease of native coronary artery without angina pectoris: Secondary | ICD-10-CM | POA: Insufficient documentation

## 2012-09-24 DIAGNOSIS — I350 Nonrheumatic aortic (valve) stenosis: Secondary | ICD-10-CM

## 2012-09-24 DIAGNOSIS — R9430 Abnormal result of cardiovascular function study, unspecified: Secondary | ICD-10-CM

## 2012-09-24 DIAGNOSIS — R0602 Shortness of breath: Secondary | ICD-10-CM | POA: Insufficient documentation

## 2012-09-24 DIAGNOSIS — R9439 Abnormal result of other cardiovascular function study: Secondary | ICD-10-CM | POA: Insufficient documentation

## 2012-09-24 SURGERY — JV LEFT HEART CATHETERIZATION WITH CORONARY ANGIOGRAM
Anesthesia: Moderate Sedation

## 2012-09-24 MED ORDER — SODIUM CHLORIDE 0.9 % IV SOLN: 250.0000 mL | INTRAVENOUS | Status: DC | PRN

## 2012-09-24 MED ORDER — SODIUM CHLORIDE 0.9 % IV SOLN: 1.0000 mL/kg/h | INTRAVENOUS | Status: DC

## 2012-09-24 MED ORDER — ASPIRIN 81 MG PO CHEW
324.0000 mg | CHEWABLE_TABLET | ORAL | Status: AC
  Administered 2012-09-24: 324 mg via ORAL

## 2012-09-24 MED ORDER — DIAZEPAM 5 MG PO TABS
5.0000 mg | ORAL_TABLET | ORAL | Status: AC
  Administered 2012-09-24: 5 mg via ORAL

## 2012-09-24 MED ORDER — ONDANSETRON HCL 4 MG/2ML IJ SOLN: 4.0000 mg | Freq: Four times a day (QID) | INTRAMUSCULAR | Status: DC | PRN

## 2012-09-24 MED ORDER — SODIUM CHLORIDE 0.9 % IJ SOLN: 3.0000 mL | Freq: Two times a day (BID) | INTRAMUSCULAR | Status: DC

## 2012-09-24 MED ORDER — SODIUM CHLORIDE 0.9 % IJ SOLN: 3.0000 mL | INTRAMUSCULAR | Status: DC | PRN

## 2012-09-24 MED ORDER — SODIUM CHLORIDE 0.9 % IV SOLN: INTRAVENOUS | Status: DC

## 2012-09-24 NOTE — OR Nursing (Signed)
Discharge instructions reviewed and signed, pt stated understanding, ambulated in hall without difficulty, site level 0, transported to nephew's car via wheelchair

## 2012-09-24 NOTE — OR Nursing (Signed)
+  Allen's test right hand 

## 2012-09-24 NOTE — OR Nursing (Signed)
Meal served 

## 2012-09-24 NOTE — CV Procedure (Signed)
PROCEDURE:  Left heart catheterization with selective coronary angiography, left ventriculogram. Abdominal aortogram.  INDICATIONS:  Abnormal stress test  The risks, benefits, and details of the procedure were explained to the patient.  The patient verbalized understanding and wanted to proceed.  Informed written consent was obtained.  PROCEDURE TECHNIQUE:  After Xylocaine anesthesia a 68F sheath was placed in the right femoral artery with a single anterior needle wall stick.   Left coronary angiography was done using a Judkins L4 guide catheter.  Right coronary angiography was done using a Judkins R4 guide catheter.  Left ventriculography was done using a pigtail catheter.    CONTRAST:  Total of 90 cc.  COMPLICATIONS:  None.    HEMODYNAMICS:  Aortic pressure was 120/58; LV pressure was 120/8; LVEDP 7.  There was no gradient between the left ventricle and aorta.    ANGIOGRAPHIC DATA:   The left main coronary artery is widely patent.  The left anterior descending artery is heavily calcified in the proximal and mid section.  The proximal LAD is patent.  After a large septal perforator, there is a heavily diseased segment of the LAD, up to 95%.  The remainder of the mid to distal LAD appears patent but is likely underfilled.  The left circumflex artery is a tortuous vessel proximally.  There is mild atherosclerosis in the mid vessel.  There is a small OM1 and small OM 2 which are patent.  The OM 3 is a large branching vessel.  There appeared to be some collaterals from the circumflex system to the distal LAD.  The right coronary artery is a large dominant vessel.  In the proximal vessel, there is moderate atherosclerosis.  In the mid vessel, there is also moderate calcific atherosclerosis at a bend.  The PDA is a medium-sized vessel which is patent.  The posterior lateral artery is a medium-sized vessel which is patent.    LEFT VENTRICULOGRAM:  Left ventricular angiogram was done in the 30 RAO  projection and revealed normal left ventricular wall motion and systolic function with an estimated ejection fraction of 45%.  LVEDP was 10 mmHg.  There is apical akinesis.   ABDOMINAL AORTOGRAM: Small AAA noted.  There are bilateral single renal arteries which appear patent.  There is mild diffuse abdominal atherosclerosis.    IMPRESSIONS:  1. Normal left main coronary artery. 2. Severe disease in the mid left anterior descending artery. 3. Mild disease in the left circumflex artery and its branches. 4. Mild to moderate disease in the right coronary artery. 5. Mildly decreased left ventricular systolic function.  LVEDP 7 mmHg.  Ejection fraction 45 %. 6.   Small infrarenal abdominal aortic aneurysm.  RECOMMENDATION:  We'll discuss options with Dr. Laneta Simmers.  The patient has a lung nodule which may need intervention.  Given the type of calcific lesion that she has in her coronary, if this were to be fixed we would likely need rotational atherectomy and drug-eluting stent placement.  Will discuss with him the urgency of biopsy/surgery for this long issue.  If a more conservative approach is reasonable, we may consider PCI of the LAD.  At this time, her only symptom of angina is exertional shortness of breath.  We'll consider adding long-acting nitrates.

## 2012-09-24 NOTE — H&P (Signed)
  Date of Initial H&P: 09/02/12  History reviewed, patient examined, no change in status, stable for surgery.

## 2012-09-25 ENCOUNTER — Encounter (HOSPITAL_BASED_OUTPATIENT_CLINIC_OR_DEPARTMENT_OTHER): Payer: Self-pay | Admitting: Interventional Cardiology

## 2012-09-25 DIAGNOSIS — R9430 Abnormal result of cardiovascular function study, unspecified: Secondary | ICD-10-CM | POA: Insufficient documentation

## 2012-09-29 ENCOUNTER — Telehealth: Payer: Self-pay | Admitting: *Deleted

## 2012-09-29 NOTE — Telephone Encounter (Signed)
Patient called because she has recently had a cardiac cath and needs to have a stent placement.  She is scheduled to f/u with Dr. Laneta Simmers 10/02/12 and was not sure if she should keep that appointment and have the stent placement before lung surgery.  I told her that she should keep her appointment with Dr. Laneta Simmers to discuss her plan of care and that I would check with his office to make sure she should keep the appointment.  I sent an In Basket message to his nurse to verify.

## 2012-10-01 ENCOUNTER — Ambulatory Visit (INDEPENDENT_AMBULATORY_CARE_PROVIDER_SITE_OTHER): Payer: BC Managed Care – PPO | Admitting: Surgery

## 2012-10-01 ENCOUNTER — Encounter: Payer: Self-pay | Admitting: Surgery

## 2012-10-01 VITALS — BP 127/74 | HR 92 | Resp 20 | Ht 60.0 in | Wt 126.0 lb

## 2012-10-01 DIAGNOSIS — R222 Localized swelling, mass and lump, trunk: Secondary | ICD-10-CM

## 2012-10-01 DIAGNOSIS — R918 Other nonspecific abnormal finding of lung field: Secondary | ICD-10-CM

## 2012-10-02 ENCOUNTER — Telehealth: Payer: Self-pay | Admitting: *Deleted

## 2012-10-02 NOTE — Telephone Encounter (Signed)
Patient had f/u visit with Dr. Laneta Simmers to review recommendations following cardiology consult and cardiac cath.  Dr. Laneta Simmers reviewed with patient the size of her lung mass and she was unsure whether size was inches or centimeters.  I clarified for her the size of the mass.  She continues to have a positive outlook and is anxious to have the coronary stent placed so that she can proceed with her surgery to remove the lung mass.  Patient denied any other questions or concerns at this time.  She was encouraged to call me for any needs.

## 2012-10-04 ENCOUNTER — Encounter: Payer: Self-pay | Admitting: Surgery

## 2012-10-04 NOTE — Progress Notes (Signed)
[address]       [name] [address]             [phone]        HPI:  The patient returns to see me today following cardiac evaluation and catheterization by Dr. Eldridge Dace. This showed a 95% mid LAD stenosis within a heavily calcified and diseased segment. Left ventricular ejection fraction is about 45% with apical akinesis. She continues to have some exertional dyspnea. Pulmonary function testing shows an FEV1 of 1.32 which is 61% predicted. There is no significant improvement with bronchodilators. The corrected diffusion capacity was 93% predicted.  Current Outpatient Prescriptions  Medication Sig Dispense Refill  . albuterol (PROVENTIL HFA) 108 (90 BASE) MCG/ACT inhaler Inhale 2 puffs into the lungs every 6 (six) hours as needed for shortness of breath.       . Cholecalciferol (VITAMIN D PO) Take 1 tablet by mouth daily.      . clopidogrel (PLAVIX) 75 MG tablet Take 75 mg by mouth daily.      . Coenzyme Q10 (CO Q 10) 100 MG CAPS Take 1 capsule by mouth daily.      . isosorbide mononitrate (IMDUR) 30 MG 24 hr tablet Take 30 mg by mouth daily.      . lansoprazole (PREVACID) 30 MG capsule Take 30 mg by mouth daily.      . Misc Natural Products (BLACK COHOSH MENOPAUSE COMPLEX) TABS Take 1 tablet by mouth daily.      . rosuvastatin (CRESTOR) 10 MG tablet Take 10 mg by mouth every Monday, Wednesday, and Friday. 1 tablet at bedtime three times per wk       No current facility-administered medications for this visit.     Physical Exam: BP 127/74  Pulse 92  Resp 20  Ht 5' (1.524 m)  Wt 126 lb (57.153 kg)  BMI 24.61 kg/m2  SpO2 95%  LMP 10/04/1999 She looks well. Lung exam is clear. Cardiac exam shows a regular rate and rhythm with normal heart sounds. There is no peripheral edema.  Diagnostic Tests:  Cardiac Cath   HEMODYNAMICS:  Aortic pressure was 120/58; LV pressure was 120/8; LVEDP 7.  There was no gradient between the left ventricle and aorta.     ANGIOGRAPHIC DATA:   The left main coronary artery is widely patent.  The left anterior descending artery is heavily calcified in the proximal and mid section.  The proximal LAD is patent.  After a large septal perforator, there is a heavily diseased segment of the LAD, up to 95%.  The remainder of the mid to distal LAD appears patent but is likely underfilled.  The left circumflex artery is a tortuous vessel proximally.  There is mild atherosclerosis in the mid vessel.  There is a small OM1 and small OM 2 which are patent.  The OM 3 is a large branching vessel.  There appeared to be some collaterals from the circumflex system to the distal LAD.  The right coronary artery is a large dominant vessel.  In the proximal vessel, there is moderate atherosclerosis.  In the mid vessel, there is also moderate calcific atherosclerosis at a bend.  The PDA is a medium-sized vessel which is patent.  The posterior lateral artery is a medium-sized vessel which is patent.    LEFT VENTRICULOGRAM:  Left ventricular angiogram was done in the 30 RAO projection and revealed normal left ventricular wall motion and systolic function with an estimated ejection  fraction of 45%.  LVEDP was 10 mmHg.  There is apical akinesis.   ABDOMINAL AORTOGRAM: Small AAA noted.  There are bilateral single renal arteries which appear patent.  There is mild diffuse abdominal atherosclerosis.    IMPRESSIONS:    1. Normal left main coronary artery. 2. Severe disease in the mid left anterior descending artery. 3. Mild disease in the left circumflex artery and its branches. 4. Mild to moderate disease in the right coronary artery. 5. Mildly decreased left ventricular systolic function.  LVEDP 7 mmHg.  Ejection fraction 45 %. 6.   Small infrarenal abdominal aortic aneurysm.  RECOMMENDATION:  We'll discuss options with Dr. Laneta Simmers.  The patient has a lung nodule which may need intervention.  Given the type of calcific lesion that she has  in her coronary, if this were to be fixed we would likely need rotational atherectomy and drug-eluting stent placement.  Will discuss with him the urgency of biopsy/surgery for this long issue.  If a more conservative approach is reasonable, we may consider PCI of the LAD.  At this time, her only symptom of angina is exertional shortness of breath.  We'll consider adding long-acting nitrates.      Impression:  I think her pulmonary function is adequate to tolerate a right upper lobectomy. I doubt that this lesion would be resectable with a wedge resection. I told her that she may have some exertional dyspnea after right upper lobectomy but complete surgical resection would give her the best long-term prognosis. I think she should have the LAD lesion stented prior to performing lung surgery. This would  require a drug-eluting stent for the best long-term result. She would need to be on dual antiplatelet therapy for least 6 months. I will plan to see her back in about 2 months and if the size of this lesion appears to be progressing then we may have to consider performing surgery earlier using an Integrilin bridge. I discussed the option of radiation therapy instead  of surgery although I don't think this would give her the best long-term prognosis. She would like to proceed with surgical therapy if possible and is in agreement with undergoing PCI first.  Plan:  I'll discuss her with Dr. Eldridge Dace so that he can arrange PCI. I'll plan to see her back in about 2 months with a repeat CT scan of the chest to followup on the right upper lobe lung lesion.

## 2012-10-06 ENCOUNTER — Encounter (HOSPITAL_COMMUNITY): Payer: Self-pay | Admitting: Pharmacy Technician

## 2012-10-07 ENCOUNTER — Other Ambulatory Visit: Payer: Self-pay | Admitting: Interventional Cardiology

## 2012-10-09 ENCOUNTER — Encounter (HOSPITAL_COMMUNITY): Payer: Self-pay

## 2012-10-09 ENCOUNTER — Inpatient Hospital Stay (HOSPITAL_COMMUNITY)
Admission: RE | Admit: 2012-10-09 | Discharge: 2012-10-11 | DRG: 549 | Disposition: A | Discharge status: Home / Self Care | Payer: BC Managed Care – PPO | Source: Ambulatory Visit | Attending: Interventional Cardiology | Admitting: Interventional Cardiology

## 2012-10-09 ENCOUNTER — Encounter (HOSPITAL_COMMUNITY)
Admission: RE | Disposition: A | Discharge status: Home / Self Care | Payer: Self-pay | Source: Ambulatory Visit | Attending: Interventional Cardiology

## 2012-10-09 DIAGNOSIS — E7800 Pure hypercholesterolemia, unspecified: Secondary | ICD-10-CM | POA: Diagnosis present

## 2012-10-09 DIAGNOSIS — I739 Peripheral vascular disease, unspecified: Secondary | ICD-10-CM | POA: Diagnosis present

## 2012-10-09 DIAGNOSIS — R9430 Abnormal result of cardiovascular function study, unspecified: Secondary | ICD-10-CM

## 2012-10-09 DIAGNOSIS — I2542 Coronary artery dissection: Secondary | ICD-10-CM | POA: Diagnosis not present

## 2012-10-09 DIAGNOSIS — I700 Atherosclerosis of aorta: Secondary | ICD-10-CM | POA: Diagnosis present

## 2012-10-09 DIAGNOSIS — M129 Arthropathy, unspecified: Secondary | ICD-10-CM | POA: Diagnosis present

## 2012-10-09 DIAGNOSIS — Y840 Cardiac catheterization as the cause of abnormal reaction of the patient, or of later complication, without mention of misadventure at the time of the procedure: Secondary | ICD-10-CM | POA: Diagnosis not present

## 2012-10-09 DIAGNOSIS — C3490 Malignant neoplasm of unspecified part of unspecified bronchus or lung: Secondary | ICD-10-CM | POA: Diagnosis present

## 2012-10-09 DIAGNOSIS — F17200 Nicotine dependence, unspecified, uncomplicated: Secondary | ICD-10-CM | POA: Diagnosis present

## 2012-10-09 DIAGNOSIS — I350 Nonrheumatic aortic (valve) stenosis: Secondary | ICD-10-CM

## 2012-10-09 DIAGNOSIS — I359 Nonrheumatic aortic valve disorder, unspecified: Secondary | ICD-10-CM | POA: Diagnosis present

## 2012-10-09 DIAGNOSIS — K219 Gastro-esophageal reflux disease without esophagitis: Secondary | ICD-10-CM | POA: Diagnosis present

## 2012-10-09 DIAGNOSIS — I9589 Other hypotension: Secondary | ICD-10-CM | POA: Diagnosis not present

## 2012-10-09 DIAGNOSIS — I25810 Atherosclerosis of coronary artery bypass graft(s) without angina pectoris: Secondary | ICD-10-CM

## 2012-10-09 DIAGNOSIS — I2510 Atherosclerotic heart disease of native coronary artery without angina pectoris: Principal | ICD-10-CM | POA: Diagnosis present

## 2012-10-09 DIAGNOSIS — I519 Heart disease, unspecified: Secondary | ICD-10-CM | POA: Diagnosis not present

## 2012-10-09 HISTORY — DX: Atherosclerotic heart disease of native coronary artery without angina pectoris: I25.10

## 2012-10-09 HISTORY — PX: PERCUTANEOUS CORONARY STENT INTERVENTION (PCI-S): SHX5485

## 2012-10-09 HISTORY — DX: Peripheral vascular disease, unspecified: I73.9

## 2012-10-09 LAB — POCT ACTIVATED CLOTTING TIME
Activated Clotting Time: 314 seconds
Activated Clotting Time: 319 seconds

## 2012-10-09 LAB — MRSA PCR SCREENING: MRSA by PCR: NEGATIVE

## 2012-10-09 SURGERY — PERCUTANEOUS CORONARY STENT INTERVENTION (PCI-S)
Anesthesia: LOCAL

## 2012-10-09 MED ORDER — SODIUM CHLORIDE 0.9 % IJ SOLN: 3.0000 mL | Freq: Two times a day (BID) | INTRAMUSCULAR | Status: DC

## 2012-10-09 MED ORDER — FENTANYL CITRATE 0.05 MG/ML IJ SOLN
INTRAMUSCULAR | Status: AC
  Filled 2012-10-09: qty 2

## 2012-10-09 MED ORDER — MIDAZOLAM HCL 2 MG/2ML IJ SOLN
INTRAMUSCULAR | Status: AC
  Filled 2012-10-09: qty 2

## 2012-10-09 MED ORDER — ONDANSETRON HCL 4 MG/2ML IJ SOLN
4.0000 mg | Freq: Four times a day (QID) | INTRAMUSCULAR | Status: DC | PRN
  Filled 2012-10-09: qty 2

## 2012-10-09 MED ORDER — HEPARIN SODIUM (PORCINE) 1000 UNIT/ML IJ SOLN
INTRAMUSCULAR | Status: AC
  Filled 2012-10-09: qty 1

## 2012-10-09 MED ORDER — CLOPIDOGREL BISULFATE 75 MG PO TABS: 75.0000 mg | ORAL_TABLET | Freq: Every day | ORAL | Status: DC

## 2012-10-09 MED ORDER — ATROPINE SULFATE 1 MG/ML IJ SOLN
INTRAMUSCULAR | Status: AC
  Filled 2012-10-09: qty 1

## 2012-10-09 MED ORDER — SODIUM CHLORIDE 0.9 % IV SOLN
0.2500 mg/kg/h | INTRAVENOUS | Status: DC
  Filled 2012-10-09: qty 250

## 2012-10-09 MED ORDER — HEPARIN (PORCINE) IN NACL 2-0.9 UNIT/ML-% IJ SOLN
INTRAMUSCULAR | Status: AC
Start: 2012-10-09 — End: 2012-10-09
  Filled 2012-10-09: qty 1500

## 2012-10-09 MED ORDER — ASPIRIN 81 MG PO CHEW
324.0000 mg | CHEWABLE_TABLET | ORAL | Status: AC
  Administered 2012-10-09: 324 mg via ORAL
  Filled 2012-10-09: qty 4

## 2012-10-09 MED ORDER — SODIUM CHLORIDE 0.9 % IV SOLN
1.0000 mL/kg/h | INTRAVENOUS | Status: AC
  Administered 2012-10-09: 1 mL/kg/h via INTRAVENOUS

## 2012-10-09 MED ORDER — METOPROLOL TARTRATE 12.5 MG HALF TABLET
12.5000 mg | ORAL_TABLET | Freq: Two times a day (BID) | ORAL | Status: DC
  Filled 2012-10-09 (×5): qty 1

## 2012-10-09 MED ORDER — CLOPIDOGREL BISULFATE 75 MG PO TABS
75.0000 mg | ORAL_TABLET | Freq: Every day | ORAL | Status: DC
  Administered 2012-10-10 – 2012-10-11 (×2): 75 mg via ORAL
  Filled 2012-10-09 (×4): qty 1

## 2012-10-09 MED ORDER — EPTIFIBATIDE 75 MG/100ML IV SOLN
INTRAVENOUS | Status: AC
  Filled 2012-10-09: qty 100

## 2012-10-09 MED ORDER — VERAPAMIL HCL 2.5 MG/ML IV SOLN
INTRAVENOUS | Status: AC
  Filled 2012-10-09: qty 4

## 2012-10-09 MED ORDER — BIVALIRUDIN 250 MG IV SOLR
INTRAVENOUS | Status: AC
  Filled 2012-10-09: qty 250

## 2012-10-09 MED ORDER — SODIUM CHLORIDE 0.9 % IJ SOLN: 3.0000 mL | INTRAMUSCULAR | Status: DC | PRN

## 2012-10-09 MED ORDER — DIAZEPAM 5 MG PO TABS
5.0000 mg | ORAL_TABLET | ORAL | Status: AC
Start: 2012-10-09 — End: 2012-10-09
  Administered 2012-10-09: 5 mg via ORAL
  Filled 2012-10-09: qty 1

## 2012-10-09 MED ORDER — ATORVASTATIN CALCIUM 20 MG PO TABS
20.0000 mg | ORAL_TABLET | Freq: Every day | ORAL | Status: DC
  Administered 2012-10-09 – 2012-10-10 (×2): 20 mg via ORAL
  Filled 2012-10-09 (×3): qty 1

## 2012-10-09 MED ORDER — ASPIRIN 81 MG PO CHEW
81.0000 mg | CHEWABLE_TABLET | Freq: Every day | ORAL | Status: DC
  Administered 2012-10-10 – 2012-10-11 (×2): 81 mg via ORAL
  Filled 2012-10-09 (×2): qty 1

## 2012-10-09 MED ORDER — ALBUTEROL SULFATE HFA 108 (90 BASE) MCG/ACT IN AERS: 2.0000 | INHALATION_SPRAY | Freq: Four times a day (QID) | RESPIRATORY_TRACT | Status: DC | PRN

## 2012-10-09 MED ORDER — PANTOPRAZOLE SODIUM 20 MG PO TBEC
20.0000 mg | DELAYED_RELEASE_TABLET | Freq: Every day | ORAL | Status: DC
  Administered 2012-10-09 – 2012-10-11 (×3): 20 mg via ORAL
  Filled 2012-10-09 (×3): qty 1

## 2012-10-09 MED ORDER — SODIUM CHLORIDE 0.9 % IV SOLN
INTRAVENOUS | Status: DC
  Administered 2012-10-09: 09:00:00 via INTRAVENOUS

## 2012-10-09 MED ORDER — SODIUM CHLORIDE 0.9 % IV SOLN: 250.0000 mL | INTRAVENOUS | Status: DC | PRN

## 2012-10-09 MED ORDER — LIDOCAINE HCL (PF) 1 % IJ SOLN
INTRAMUSCULAR | Status: AC
  Filled 2012-10-09: qty 30

## 2012-10-09 NOTE — CV Procedure (Signed)
PROCEDURE:  Rotational atherectomy of the LAD, PCI LAD, PCI of the left circumflex/OM 1, and left main.  Attempted Impella catheter placement.  Intra-aortic balloon pump placement  INDICATIONS:  dyspnea on exertion despite antianginal medicines; moderate risk stress test noted during preoperative stress test for lung cancer removal.   The risks, benefits, and details of the procedure were explained to the patient.  The patient verbalized understanding and wanted to proceed.  Informed written consent was obtained.  OPERATORS: Dr. Eldridge Dace, Dr. Excell Seltzer, Dr. Katrinka Blazing  PROCEDURE TECHNIQUE:  After Xylocaine anesthesia a a 7 F sheath was placed in the right femoral artery with a single anterior needle wall stick.   Left coronary angiography was done using a  Voda 3.5  guide catheter.    Angiomax used for anticoagulation.  An ACT was used to check that the Angiomax was therapeutic.  The intervention was then performed.  Please see below for details.  During the procedure, the left groin was prepped and draped and a 14 French Impella sheath was placed.  Due to calcific plaque in the aorta, the ImPella catheter would not reach the left ventricle.  The device was removed and the sheath left in place.  An intra-aortic balloon pump was placed through the large sheath.  Manual compression will be used for hemostasis for both groins.   CONTRAST:  Total of 365 cc.  COMPLICATIONS:  None.       ANGIOGRAPHIC DATA:     The left anterior descending artery a large vessel which wraps around the apex.  In the midportion of the vessel, there is a 99% stenosis which is heavily calcified.  Within the segment of disease, there is a diagonal vessel as well.  There appear to be left to left collaterals coming from a large proximal septal branch, to the distal LAD.   PCI NARRATIVE:  The guide catheter was advanced to the left main.  After a therapeutic ACT was obtained, a Fielder XT wire was loaded into a 1.5 x 12 over-the-wire  balloon and advanced to the LAD.  The [facility] wire did cross the area of stenosis with some manipulation but the over-the-wire balloon would not cross the stenosis.  The intent was for [place] to advance the balloon and change out for a Rotafloppy wire.  The [facility] wire was then removed and a Rotafloppy wire was advanced into the LAD, but would not cross the area of heavy disease at the tortuous segment in the mid LAD.  We then went back with a Fielder wire and this was placed down the LAD.  The over-the-wire balloon was removed and a Corsair catheter was advanced to the mid LAD.  The Corsair catheter was advanced far enough to allow switching out for a Rotafloppy wire.  A 1.5 burr was used to treat the area of heavily calcified disease.  2 passes were made.  Subsequent angiography revealed a diffusely narrowed LAD.  It was unclear whether this was spasm or whether this was dissection.  Several doses of intracoronary nitroglycerin were administered with some improvement.  A 2.0 x 20 balloon was advanced to the mid LAD and inflated which also gave some improvement in flow.  Subsequent angiography showed that the narrowing was extending proximally.  A 2.5 x 15 balloon was used to perform a prolonged inflation in the proximal LAD, where there did appear to be a dissection flap.  Despite a prolonged balloon inflation, the diffusely narrowed appearance of the LAD persisted.  We then  decided to stent the LAD from the distal vessel to proximal edge to cover what appeared to be dissection.  A 2.25 x 32 Promus drug-eluting stent was deployed in the mid LAD at 11 atmospheres for 18 seconds.  This significantly improved the appearance of the most heavily diseased section.  A 2.5 x 38 Promus drug-eluting stent was then deployed in overlapping fashion at the proximal edge of the previously placed stent.  Subsequent angiography showed shift of hematoma into the proximal LAD.  As we were planning to stent the proximal LAD, there  appear to be some intraluminal hematoma shift into the proximal circumflex.  A pro-water wire was then placed into the circumflex to protect this vessel.    With a pro water down the circumflex, a 3.5 x 16 Promus drug-eluting stent was advanced into the left main covering the proximal circumflex.  The stent was deployed at 11 atmospheres.  There appears to be spiral dissection in the remainder of the circumflex without any meaningful flow to the distal vessel.  We then advanced a 2.5 x 15 emerge balloon to the mid circumflex and OM1.  Multiple balloon inflations were performed with some improvement in flow.  At this point, with 2 vessels involved, the patient became mildly hypotensive.  She had some ST segment changes.  Due to the fact that stenting of the left main would have to be done, we attempted to place an Impella device in the left groin.  Dr. Excell Seltzer and Dr. Katrinka Blazing graciously aided in gaining access to the left femoral artery.  The sheath was placed with some difficulty.  The Impella  device was advanced but would not cross the area of moderate plaque in the distal aorta.  At that point, an IABP  was inserted through the  14 French sheath.  The IABP was placed on 1:1.  The patient remained hemodynamically stable.  She did not report any chest pain.  Later during the case, she had some left leg pain.    We then turned our attention back to the proximal LAD.  A 3.0 x 16 Promus drug-eluting stent was deployed across the proximal LAD and covering much of the left main, overlapping the prior stent which extended into the circumflex.    The circumflex was then rewired with a Fielder XT.  The struts of the proximal circumflex/left main stent were postdilated with a 4.0 x 12 noncompliant balloon.  With some difficulty, the wire eventually went down the large OM branch.  Multiple balloon inflations were performed with a 2.5 x 15 emerge from the mid OM1 to the proximal circumflex and there was significant  improvement in flow.  We tried to advance a 2.5 x 38 Promus drug-eluting stent to cover the OM1/circumflex, but due to the stents in the proximal vessel, the long Promus stent would not advance.  We tried with a 20 mm stent were unsuccessful.  Finally, a guide liner was advanced over and inflated 2.5 balloon into the proximal circumflex.  The 38 mm stent would not go through the guide liner but eventually, a 2.5 x 20 mm stent did advance.  This was deployed.  We can attempted to get the 38 mm stent through the guide liner were unsuccessful.  Another 2.5 x 20 stent was deployed overlapping the more distal stent.  A 3.0 x 16 stent was then deployed which overlapped the distal stented area in the circumflex and the proximal circumflex/left main stent.  There appeared to be a  linear dissection at the edge of the distal stent in the OM, but TIMI 3 flow remained.  The circumflex was postdilated with a 3.5 x 12 [place] [employer] apex.  The proximal LAD was then postdilated with a 3.0 x 15 noncompliant balloon.  We finished the procedure with kissing balloon angioplasty in the left main, proximal LAD and proximal circumflex.  There is a 3.0 x 15 in the left main extending into the proximal LAD.  There is a 3.0 x 12 from the left main extending into the circumflex.  Both were inflated to 10 atmospheres.  There is an excellent angiographic result.  TIMI-3 flow was maintained in both the LAD and circumflex.  The distal LAD was increasing in size.  The intra-aortic balloon pump was removed.  Pelvic angiography was performed due to the patient's left leg pain.  It appeared that the 14 French sheath in the left groin was occlusive.  IMPRESSIONS:  1.  Complex bifurcation stenting of the  left main coronary artery, with drug-eluting stents extending into the left anterior descending artery and left circumflex artery and a large OM1.  2.  Initial rotational atherectomy of the LAD with subsequent dissection which propagated into  the circumflex causing the above complex revascularization to be necessary, with a total of 7 stents.  3.   Attempted Impella catheter placement into left ventricle, prevented by calcified plaque in the distal aorta.  Subsequently, Intra-aortic balloon pump placement for the procedure.  RECOMMENDATION:   The patient will be watched in the CCU.  Continue lifelong dual antiplatelet therapy indefinitely given the number and location of her stents.  Will discuss with Dr. Laneta Simmers the timing of her lung cancer removal surgery.

## 2012-10-09 NOTE — H&P (Signed)
Admit date: (Not on file) Referring Physician  Dr. Laneta Simmers Primary Physician  Dr. Earl Gala Primary Cardiologist  Dr. Eldridge Dace Reason for Consultation  CAD  Clinical Evaluation Leading to the Procedure:   ACS: no  Non-ACS:    Anginal Classification: CCS III  Anti-ischemic medical therapy: Maximal Therapy (2 or more classes of medications)  Non-Invasive Test Results: Intermediate-risk stress test findings: cardiac mortality 1-3%/year  Prior CABG: No previous CABG  HPI: 62 y/o woman who has smoked for a long time.  SHe likely has a slow growing lung CA.  As part of preop w/u, she reported DOE.  She had a moderate risk stress test and a 99% LAD lesion.  DOE persisted despite 2 antianginals.  The surgeon did not want to remove the CA until this LAD lesion was fixed.      PMH:   Past Medical History  Diagnosis Date  . GERD (gastroesophageal reflux disease)   . Hypercholesterolemia   . Seasonal allergies   . Tobacco abuse   . Cataract     "? right" (06/24/2012)  . Aortic valve stenosis, moderate   . Heart murmur   . Mass of lung     "right spot; they are watching it" (06/24/2012)  . Cellulitis 06/20/2012    "dog bite; right forearm" (06/24/2012)  . Arthritis     "across my hips; comes w/the weather" (06/24/2012)  . Nonspecific abnormal unspecified cardiovascular function study      PSH:   Past Surgical History  Procedure Laterality Date  . Mouth surgery  2010?    "for bone loss" (06/24/2012)  . Video bronchoscopy  09/12/2011    Procedure: VIDEO BRONCHOSCOPY WITH FLUORO;  Surgeon: Nyoka Cowden, MD;  Location: [facility];  Service: Cardiopulmonary;  Laterality: Bilateral;  . Tonsillectomy  1960  . Ganglion cyst excision  1975    Allergies:  Azithromycin; Ceclor; Morphine and related; Penicillins; Vicodin; Doxycycline; and Septra Prior to Admit Meds:   No prescriptions prior to admission   Fam HX:    Family History  Problem Relation Age of Onset  . Alcohol abuse Father     Social HX:    History   Social History  . Marital Status: Married    Spouse Name: N/A    Number of Children: 1  . 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 Smoking status: Former Smoker -- 0.50 packs/day for 40 years    Types: Cigarettes    Quit date: 06/23/2012  . Smokeless tobacco: Never Used  . Alcohol Use: No  . Drug Use: No  . Sexually Active: Yes   Other Topics Concern  . Not on file   Social History Narrative  . No narrative on file     ROS:  All 11 ROS were addressed and are negative except what is stated in the HPI   Last menstrual period 10/04/1999.    Physical Exam:  General: Well developed, well nourished, in no acute distress Head:    Normal cephalic and atramatic  Lungs:   Clear bilaterally to auscultation and percussion. Heart:   HRRR S1 S2              No JVD.  Abdomen: abdomen soft and non-tender  Msk:  Back normal, normal gait. Normal strength and tone for age. Extremities:  No edema.  DP +1 Neuro: Alert and oriented X 3. Psych:  Good affect, responds appropriately  Labs:   Lab Results  Component Value Date   WBC 7.5 06/27/2012   HGB 10.6* 06/27/2012   HCT 31.5* 06/27/2012   MCV 89.7 06/27/2012   PLT 192 06/27/2012   No results found for this basename: NA, K, CL, CO2, BUN, CREATININE, CALCIUM, LABALBU, PROT, BILITOT, ALKPHOS, ALT, AST, GLUCOSE,  in the last 168 hours No results found for this basename: PTT   No results found for this basename: INR, PROTIME   No results found for this basename: CKTOTAL, CKMB, CKMBINDEX, TROPONINI     No results found for this basename: CHOL   No results found for this basename: HDL   No results found for this basename: LDLCALC   No results found for this basename: TRIG   No results found for this basename: CHOLHDL   No results found for this basename: LDLDIRECT      Radiology:  No results found.    ASSESSMENT: CAD-Class III  anginal equivalent despite 2 antianginal meds  PLAN:  Plan PCI of the mid LAD with rotational atherectomy.  Risks and benefits explained to the patient and she is agreeable.  All questions answered.           Corky Crafts., MD  10/09/2012  12:11 AM

## 2012-10-09 NOTE — Progress Notes (Signed)
1840 10/09/12   Rt femoral sheath removed and pressure held times 20 minutes> Level 0 before and after removal. Dr. Eldridge Dace removing 14 Fr. Sheath in Lt femoral.  Monti Villers Burnett BP dropped during sheath pull and 250 NS bolus given.  BP remain WLN after. Arley Garant, Linnell Fulling

## 2012-10-10 ENCOUNTER — Encounter (HOSPITAL_COMMUNITY): Payer: Self-pay | Admitting: Interventional Cardiology

## 2012-10-10 DIAGNOSIS — I739 Peripheral vascular disease, unspecified: Secondary | ICD-10-CM | POA: Insufficient documentation

## 2012-10-10 LAB — CBC
MCHC: 33.8 g/dL (ref 30.0–36.0)
RDW: 14.3 % (ref 11.5–15.5)

## 2012-10-10 LAB — BASIC METABOLIC PANEL
BUN: 12 mg/dL (ref 6–23)
GFR calc Af Amer: >90 mL/min (ref >90)
GFR calc non Af Amer: >90 mL/min (ref >90)
Potassium: 3.9 mEq/L (ref 3.5–5.1)
Sodium: 135 mEq/L (ref 135–145)

## 2012-10-10 MED FILL — Sodium Chloride IV Soln 0.9%: INTRAVENOUS | Qty: 50 | Status: AC

## 2012-10-10 NOTE — Care Management Note (Signed)
    Page 1 of 1   10/10/2012     8:58:28 AM   CARE MANAGEMENT NOTE 10/10/2012  Patient:  Teresa [name], POSA   Account Number:  [id number]  Date Initiated:  10/10/2012  Documentation initiated by:  Junius Creamer  Subjective/Objective Assessment:   adm w angina, had stent in cath lab placed     Action/Plan:   lves w husband, pcp dr Theressa Millard   Anticipated DC Date:     Anticipated DC Plan:  HOME/SELF CARE      DC Planning Services  CM consult      Choice offered to / List presented to:             Status of service:   Medicare Important Message given?   (If response is "NO", the following Medicare IM given date fields will be blank) Date Medicare IM given:   Date Additional Medicare IM given:    Discharge Disposition:  HOME/SELF CARE  Per UR Regulation:  Reviewed for med. necessity/level of care/duration of stay  If discussed at Long Length of Stay Meetings, dates discussed:    Comments:

## 2012-10-10 NOTE — Progress Notes (Signed)
SUBJECTIVE:  No chest pain  OBJECTIVE:   Vitals:   Filed Vitals:   10/10/12 0400 10/10/12 0500 10/10/12 0600 10/10/12 0700  BP: 103/56 89/46 92/46  98/41  Pulse: 73 75 74 70  Temp: 98.2 F (36.8 C)     TempSrc: Oral     Resp: 18 14 13 14   Height:      Weight: 58.9 kg (129 lb 13.6 oz)     SpO2: 94% 93% 93% 97%   I&O's:   Intake/Output Summary (Last 24 hours) at 10/10/12 0848 Last data filed at 10/10/12 0400  Gross per 24 hour  Intake 891.14 ml  Output    525 ml  Net 366.14 ml   TELEMETRY: Reviewed telemetry pt in NSR:     PHYSICAL EXAM General: Well developed, well nourished, in no acute distress Head:  Normal cephalic and atramatic  Lungs:  No wheezing Heart:   HRRR S1 S2  No JVD.   Abdomen:  abdomen soft and non-tender Msk:  Back normal, normal gait. Normal strength and tone for age. Extremities:   No edema. No hematoma bilaterally , left DP +1; 2+ right PT pulse Neuro: Alert and oriented X 3. Psych: Normal affect, responds appropriately   LABS: Basic Metabolic Panel:  Recent Labs  95/28/41 0500  NA 135  K 3.9  CL 102  CO2 26  GLUCOSE 103*  BUN 12  CREATININE 0.47*  CALCIUM 8.4   Liver Function Tests: No results found for this basename: AST, ALT, ALKPHOS, BILITOT, PROT, ALBUMIN,  in the last 72 hours No results found for this basename: LIPASE, AMYLASE,  in the last 72 hours CBC:  Recent Labs  10/10/12 0500  WBC 7.1  HGB 10.3*  HCT 30.5*  MCV 92.1  PLT 194   Cardiac Enzymes: No results found for this basename: CKTOTAL, CKMB, CKMBINDEX, TROPONINI,  in the last 72 hours BNP: No components found with this basename: POCBNP,  D-Dimer: No results found for this basename: DDIMER,  in the last 72 hours Hemoglobin A1C: No results found for this basename: HGBA1C,  in the last 72 hours Fasting Lipid Panel: No results found for this basename: CHOL, HDL, LDLCALC, TRIG, CHOLHDL, LDLDIRECT,  in the last 72 hours Thyroid Function Tests: No results found  for this basename: TSH, T4TOTAL, FREET3, T3FREE, THYROIDAB,  in the last 72 hours Anemia Panel: No results found for this basename: VITAMINB12, FOLATE, FERRITIN, TIBC, IRON, RETICCTPCT,  in the last 72 hours Coag Panel:   No results found for this basename: INR, PROTIME    RADIOLOGY: No results found.    ASSESSMENT: s/p complex PCI to LAD, Left main and circumflex after dissection  PLAN:  Continue aspirin and plavix.  P2Y12 testing in AM.  Did not do today due to integrilin use during the procedure.  Ambulate with cardiac rehab.  If no problems, would move to tele.  Smoking cessation.  BP borderline.  Holding nitrates and imdur.  Metoprolol as tolerated.    Likely PVD involving left leg given some left leg claudication prior to this procedure,  And decreased left pulses compared to right.  Lipid lowering therapy needed.      Corky Crafts., MD  10/10/2012  8:48 AM

## 2012-10-10 NOTE — Progress Notes (Signed)
Patient has been ambulating in the halls tonight, approximately 400 ft independently with her family.  Patient's vital signs were stable during her walk. Patient has tolerated ambulation well. Will continue to monitor.

## 2012-10-10 NOTE — Progress Notes (Signed)
CARDIAC REHAB PHASE I   PRE:  Rate/Rhythm: 103 ST    BP: lying 99/42, sitting 100/39, standing 121/57    SaO2: 96 RA  MODE:  Ambulation: 350 ft   POST:  Rate/Rhythm: 115 ST    BP: sitting 92/35     SaO2:   Pt nervous about getting up. Left groin sore. BP low, increased with getting up then low again after walk and sitting in recliner. Pt's only c/o walking was hip and back pain. HR elevated. Ed completed really focusing on smoking cessation. Pt wants to quit and has had recent failed attempts. Ready to try again. Pt interested in [facility] and will send referral to [place] [facility].  [id number]   Harriet Masson CES, ACSM 10/10/2012 2:36 PM

## 2012-10-10 NOTE — Progress Notes (Signed)
Report called to [place], RN on 2000. Pt and family aware of new room assignment. All belongings with pt. Medications sent.

## 2012-10-11 ENCOUNTER — Encounter (HOSPITAL_COMMUNITY): Payer: Self-pay | Admitting: Interventional Cardiology

## 2012-10-11 DIAGNOSIS — I2510 Atherosclerotic heart disease of native coronary artery without angina pectoris: Secondary | ICD-10-CM | POA: Insufficient documentation

## 2012-10-11 LAB — CBC
HCT: 34 % — ABNORMAL LOW (ref 36.0–46.0)
MCH: 31.1 pg (ref 26.0–34.0)
MCHC: 33.5 g/dL (ref 30.0–36.0)
RDW: 14.2 % (ref 11.5–15.5)

## 2012-10-11 LAB — PLATELET INHIBITION P2Y12: Platelet Function  P2Y12: 205 [PRU] (ref 194–418)

## 2012-10-11 MED ORDER — TICAGRELOR 90 MG PO TABS
90.0000 mg | ORAL_TABLET | Freq: Two times a day (BID) | ORAL | Status: DC
  Administered 2012-10-11: 90 mg via ORAL
  Filled 2012-10-11 (×2): qty 1

## 2012-10-11 MED ORDER — ASPIRIN 81 MG PO CHEW: 81.0000 mg | CHEWABLE_TABLET | Freq: Every day | ORAL | Status: DC

## 2012-10-11 MED ORDER — TICAGRELOR 90 MG PO TABS: 90.0000 mg | ORAL_TABLET | Freq: Two times a day (BID) | ORAL | Status: DC

## 2012-10-11 NOTE — Progress Notes (Signed)
CARDIAC REHAB PHASE I   PRE:  Rate/Rhythm: 99 SR  BP:  Supine: 122/76  Sitting:   Standing:    SaO2: 96% RA  MODE:  Ambulation: 550 ft   POST:  Rate/Rhythem: 105 ST  BP:  Supine:   Sitting: 120/62  Standing:    SaO2: 97% RA  0954-1020 Pt tolerated ambulation well with assist x 1. No c/o, VSS. To bedside after walk, for d/c home today. Reviewed activity progression and restrictions with patient.  Cristy Hilts, MS, ACSM CES

## 2012-10-11 NOTE — Discharge Summary (Signed)
Patient ID: Teresa [name] MRN: [id number] DOB/AGE: [birth year] 62 y.o.  Admit date: 10/09/2012 Discharge date: 10/11/2012  Primary Discharge Diagnosis CAD Secondary Discharge Diagnosis lung cancer, tobacco abuse  Significant Diagnostic Studies: angiography: Drug eluting stents to the left main, LAD and circ.  Consults: None  Hospital Course: 62 y/o woman with lung CA who will require surgery.  She came infor planned rotational atherectomy of the LAD.  This was complicated by dissection of the LAD which spread back into the left main and circumflex.  She had a prolonged procedure with 7 stents placed.  She had an IABP during the procedure after an IMpella would not cross a moderate stenosis in the distal aorta.  She tolerated the procedure remarkably well for the circumstances.   She had some mild hypotension at rest.  Her blood pressure increased with ambulation.  Her Norvasc and Imdur were stopped.  Afterwards,hemostasis was obtained in both groins. CBC was stable.  She ambulated without difficulty with cardiac rehab.  A P2Y12 platelet inhibition test was performed due to the multiple stents in her LAD, left main and circumflex.  The result was 205.  There is some clinical data showing increased cardiac events when the number is 208 or greater.  Therefore, her Plavix was stopped and she was started on Brilinta.  She received the first dose in the hospital.  We felt this would give better platelet inhibition.  Perhaps in the future, after the acute event, her platelet inhibition would improve on Plavix.  We will reconsider checking the blood test again in 1-2 months.    I stressed the importance of dual antiplatelet therapy.  She is not to stop her antiplatelet medicines unless instructed by her cardiologist.  She understands this.  I will talk to Dr. Laneta Simmers regarding the timing of her lung cancer removal.  Given the number of stents she has, she will likely have to be admitted before surgery for  several days and received IV Integrilin as her oral antiplatelet medicine is washing out.   Discharge Exam: Blood pressure 111/43, pulse 87, temperature 98.7 F (37.1 C), temperature source Oral, resp. rate 18, height 4' 11.5" (1.511 m), weight 58.9 kg (129 lb 13.6 oz), last menstrual period 10/04/1999, SpO2 94.00%.   [place]/AT RRR, S1, S2 No wheezing Soft, nontender, nondistended No edema Mild left groin bruising, no hematoma No significant right groin bruising, no hematoma  Labs:   Lab Results  Component Value Date   WBC 6.3 10/11/2012   HGB 11.4* 10/11/2012   HCT 34.0* 10/11/2012   MCV 92.9 10/11/2012   PLT 190 10/11/2012    Recent Labs Lab 10/10/12 0500  NA 135  K 3.9  CL 102  CO2 26  BUN 12  CREATININE 0.47*  CALCIUM 8.4  GLUCOSE 103*   No results found for this basename: CKTOTAL, CKMB, CKMBINDEX, TROPONINI    No results found for this basename: CHOL   No results found for this basename: HDL   No results found for this basename: LDLCALC   No results found for this basename: TRIG   No results found for this basename: CHOLHDL   No results found for this basename: LDLDIRECT       EKG:NSR, nonspecific ST segment changes  FOLLOW UP PLANS AND APPOINTMENTS Discharge Orders   Future Appointments Provider Department Dept Phone   12/03/2012 10:00 AM Alleen Borne, MD [facility] [phone]   Future Orders Complete By Expires  Amb Referral to Cardiac Rehabilitation  As directed         Medication List    STOP taking these medications       amLODipine 2.5 MG tablet  Commonly known as:  NORVASC     clopidogrel 75 MG tablet  Commonly known as:  PLAVIX     isosorbide mononitrate 30 MG 24 hr tablet  Commonly known as:  IMDUR      TAKE these medications       aspirin 81 MG chewable tablet  Chew 1 tablet (81 mg total) by mouth daily.     BLACK COHOSH MENOPAUSE COMPLEX Tabs  Take 1 tablet by mouth daily. [facility]  Menopause Support     cholecalciferol 1000 UNITS tablet  Commonly known as:  VITAMIN D  Take 1,000 Units by mouth daily.     Co Q 10 100 MG Caps  Take 1 capsule by mouth daily.     lansoprazole 30 MG capsule  Commonly known as:  PREVACID  Take 30 mg by mouth daily.     PROVENTIL HFA 108 (90 BASE) MCG/ACT inhaler  Generic drug:  albuterol  Inhale 2 puffs into the lungs every 6 (six) hours as needed for shortness of breath. For wheezing     rosuvastatin 10 MG tablet  Commonly known as:  CRESTOR  Take 10 mg by mouth every Monday, Wednesday, and Friday. 1 tablet at bedtime three times per wk     Ticagrelor 90 MG Tabs tablet  Commonly known as:  BRILINTA  Take 1 tablet (90 mg total) by mouth 2 (two) times daily.           Follow-up Information   Follow up with Corky Crafts., MD. Schedule an appointment as soon as possible for a visit in 2 weeks.   Contact information:   [address] [place] [address] [phone]       BRING ALL MEDICATIONS WITH YOU TO FOLLOW UP APPOINTMENTS  Time spent with patient to include physician time: 40 minutes going over the plan of care in regards to lung cancer surgery, and medication changes  Signed: Sanford Lindblad S. 10/11/2012, 8:42 AM

## 2012-10-30 ENCOUNTER — Encounter (HOSPITAL_COMMUNITY)
Admission: RE | Admit: 2012-10-30 | Discharge: 2012-10-30 | Disposition: A | Discharge status: Home / Self Care | Payer: BC Managed Care – PPO | Source: Ambulatory Visit | Attending: Interventional Cardiology | Admitting: Interventional Cardiology

## 2012-10-30 NOTE — Progress Notes (Signed)
Cardiac Rehab Medication Review by a Pharmacist  Does the patient  feel that his/her medications are working for him/her?  yes  Has the patient been experiencing any side effects to the medications prescribed?  no  Does the patient measure his/her own blood pressure or blood glucose at home?  no   Does the patient have any problems obtaining medications due to transportation or finances?   no  Understanding of regimen: good Understanding of indications: good Potential of compliance: good    Pharmacist comments:  Teresa [name] is a 62 yo female, very pleasant this morning, came in with her list of medications and allergies. She expressed understanding of her medications and knew to take tylenol for pain rather than motrin/advil due to the pre-existing aspirin and brilinta. She claims no BP issues, is not on any meds and does not take BP at home. Has no issues with getting her medicaitons. She is also on crestor three times a week due to muscle pain from her previous dose, which has resolved. She currently has some hip to leg pain, being treated with tylenol for the time being. I have updated her medication list and reviewed her allergies.   Tyrone Nine. Artelia Laroche, PharmD Clinical Pharmacist - Resident Pager: [phone] Phone: [phone] 10/30/2012 8:14 AM      Anthony Sar B 10/30/2012 8:08 AM

## 2012-11-05 ENCOUNTER — Encounter (HOSPITAL_COMMUNITY)
Admission: RE | Admit: 2012-11-05 | Discharge: 2012-11-05 | Disposition: A | Discharge status: Home / Self Care | Payer: BC Managed Care – PPO | Source: Ambulatory Visit | Attending: Interventional Cardiology | Admitting: Interventional Cardiology

## 2012-11-05 DIAGNOSIS — Z9861 Coronary angioplasty status: Secondary | ICD-10-CM | POA: Insufficient documentation

## 2012-11-05 DIAGNOSIS — I2510 Atherosclerotic heart disease of native coronary artery without angina pectoris: Secondary | ICD-10-CM | POA: Insufficient documentation

## 2012-11-05 DIAGNOSIS — Z5189 Encounter for other specified aftercare: Secondary | ICD-10-CM | POA: Insufficient documentation

## 2012-11-05 DIAGNOSIS — C3490 Malignant neoplasm of unspecified part of unspecified bronchus or lung: Secondary | ICD-10-CM | POA: Insufficient documentation

## 2012-11-05 DIAGNOSIS — I700 Atherosclerosis of aorta: Secondary | ICD-10-CM | POA: Insufficient documentation

## 2012-11-05 NOTE — Progress Notes (Signed)
Pt started cardiac rehab today.  Pt tolerated light exercise without difficulty. Telemetry rhythm Sinus without ectopy.  Zaydah's  Blood pressure was noted at 170/72 on the airdyne today at cardiac rehab. Dwana exceeded her target heart rate.  Subsequent blood pressures improved.  Exit blood pressure 126/70. Will fax exercise flow sheets to Dr. Maylon Cos  office for review. Will continue to monitor the patient throughout  the program.

## 2012-11-07 ENCOUNTER — Encounter (HOSPITAL_COMMUNITY)
Admission: RE | Admit: 2012-11-07 | Discharge: 2012-11-07 | Disposition: A | Discharge status: Home / Self Care | Payer: BC Managed Care – PPO | Source: Ambulatory Visit | Attending: Interventional Cardiology | Admitting: Interventional Cardiology

## 2012-11-10 ENCOUNTER — Encounter (HOSPITAL_COMMUNITY)
Admission: RE | Admit: 2012-11-10 | Discharge: 2012-11-10 | Disposition: A | Discharge status: Home / Self Care | Payer: BC Managed Care – PPO | Source: Ambulatory Visit | Attending: Interventional Cardiology | Admitting: Interventional Cardiology

## 2012-11-12 ENCOUNTER — Encounter (HOSPITAL_COMMUNITY)
Admission: RE | Admit: 2012-11-12 | Discharge: 2012-11-12 | Disposition: A | Discharge status: Home / Self Care | Payer: BC Managed Care – PPO | Source: Ambulatory Visit | Attending: Interventional Cardiology | Admitting: Interventional Cardiology

## 2012-11-12 NOTE — Progress Notes (Signed)
PSYCHOSOCIAL ASSESSMENT  Pt psychosocial assessment reveals no barriers to rehab participation.  Pt exhibits positive coping skills and has supportive family and friends.   Pt self reflected quality of life scores are very good.  Pt denies recent changes or losses in addition to this illness.   Offered emotional support and reassurance.  Will continue to monitor.

## 2012-11-14 ENCOUNTER — Encounter (HOSPITAL_COMMUNITY)
Admission: RE | Admit: 2012-11-14 | Discharge: 2012-11-14 | Disposition: A | Discharge status: Home / Self Care | Payer: BC Managed Care – PPO | Source: Ambulatory Visit | Attending: Interventional Cardiology | Admitting: Interventional Cardiology

## 2012-11-14 NOTE — Progress Notes (Signed)
Reviewed home exercise with pt today.  Pt plans to continue walking at home, in addition to an extra day of hand weights for exercise.  Reviewed THR, pulse, RPE, sign and symptoms, and when to call 911 or MD.  Pt voiced understanding.  Alexia Freestone, MS, ACSM RCEP 11/14/2012 1330

## 2012-11-17 ENCOUNTER — Encounter (HOSPITAL_COMMUNITY)
Admission: RE | Admit: 2012-11-17 | Discharge: 2012-11-17 | Disposition: A | Discharge status: Home / Self Care | Payer: BC Managed Care – PPO | Source: Ambulatory Visit | Attending: Interventional Cardiology | Admitting: Interventional Cardiology

## 2012-11-19 ENCOUNTER — Encounter (HOSPITAL_COMMUNITY)
Admission: RE | Admit: 2012-11-19 | Discharge: 2012-11-19 | Disposition: A | Discharge status: Home / Self Care | Payer: BC Managed Care – PPO | Source: Ambulatory Visit | Attending: Interventional Cardiology | Admitting: Interventional Cardiology

## 2012-11-21 ENCOUNTER — Encounter (HOSPITAL_COMMUNITY)
Admission: RE | Admit: 2012-11-21 | Discharge: 2012-11-21 | Disposition: A | Discharge status: Home / Self Care | Payer: BC Managed Care – PPO | Source: Ambulatory Visit | Attending: Interventional Cardiology | Admitting: Interventional Cardiology

## 2012-11-24 ENCOUNTER — Encounter (HOSPITAL_COMMUNITY)
Admission: RE | Admit: 2012-11-24 | Discharge: 2012-11-24 | Disposition: A | Discharge status: Home / Self Care | Payer: BC Managed Care – PPO | Source: Ambulatory Visit | Attending: Interventional Cardiology | Admitting: Interventional Cardiology

## 2012-11-26 ENCOUNTER — Encounter (HOSPITAL_COMMUNITY)
Admission: RE | Admit: 2012-11-26 | Discharge: 2012-11-26 | Disposition: A | Discharge status: Home / Self Care | Payer: BC Managed Care – PPO | Source: Ambulatory Visit | Attending: Interventional Cardiology | Admitting: Interventional Cardiology

## 2012-11-28 ENCOUNTER — Encounter (HOSPITAL_COMMUNITY)
Admission: RE | Admit: 2012-11-28 | Discharge: 2012-11-28 | Disposition: A | Discharge status: Home / Self Care | Payer: BC Managed Care – PPO | Source: Ambulatory Visit | Attending: Interventional Cardiology | Admitting: Interventional Cardiology

## 2012-11-28 NOTE — Progress Notes (Signed)
Pt frequently c/o heartburn associated with different activities at cardiac rehab.  Initially associated with increased speed on treadmill, however now also occurs with lesser activities such as stretching and cool down.   Pt also mildly tachycardic today.  Pt describes as heart burn in epigastric area associated with regurgitation and water brash symptoms.  Pt states when she get these symptoms at home they are relieved with Zantac OTC.  Spoke to Amy, Dr. Hoyle Barr nurse.  Amy will review symptoms with Dr. Eldridge Dace for further recommendations.

## 2012-12-01 ENCOUNTER — Other Ambulatory Visit: Payer: Self-pay | Admitting: *Deleted

## 2012-12-01 ENCOUNTER — Encounter (HOSPITAL_COMMUNITY)
Admission: RE | Admit: 2012-12-01 | Discharge: 2012-12-01 | Disposition: A | Discharge status: Home / Self Care | Payer: BC Managed Care – PPO | Source: Ambulatory Visit | Attending: Interventional Cardiology | Admitting: Interventional Cardiology

## 2012-12-01 DIAGNOSIS — I2510 Atherosclerotic heart disease of native coronary artery without angina pectoris: Secondary | ICD-10-CM

## 2012-12-02 ENCOUNTER — Telehealth: Payer: Self-pay | Admitting: *Deleted

## 2012-12-02 NOTE — Telephone Encounter (Signed)
I called patient to check in and to remind her of her appointment with Dr. Laneta Simmers tomorrow.  Patient reports that she is doing well.  Her only complaint is the "kink" she has in her right leg from her groin to her toes that has been present since she had her procedure to place the coronary stents.  She reports that it feels like nerve pain.  She has also been experiencing heartburn and gastric reflux while walking on the treadmill at cardiac rehab.  She reports that her medications have been changed to see if it will help relieve her symptoms.  She reports that she quit smoking and has gained 10 pounds.  We discussed the plan for surgery to resect her lung cancer and she says that her cardiologist prefers that she remain on blood thinners for 6 months to 1 year before interrupting to have surgery. She will discuss with Dr. Laneta Simmers.  Patient denied any needs at this time, I encouraged her to call me for any questions or concerns.

## 2012-12-03 ENCOUNTER — Other Ambulatory Visit: Payer: Self-pay | Admitting: *Deleted

## 2012-12-03 ENCOUNTER — Encounter: Payer: Self-pay | Admitting: Surgery

## 2012-12-03 ENCOUNTER — Ambulatory Visit
Admission: RE | Admit: 2012-12-03 | Discharge: 2012-12-03 | Disposition: A | Discharge status: Home / Self Care | Payer: BC Managed Care – PPO | Source: Ambulatory Visit | Attending: Surgery | Admitting: Surgery

## 2012-12-03 ENCOUNTER — Encounter (HOSPITAL_COMMUNITY)
Admission: RE | Admit: 2012-12-03 | Discharge: 2012-12-03 | Disposition: A | Discharge status: Home / Self Care | Payer: BC Managed Care – PPO | Source: Ambulatory Visit | Attending: Interventional Cardiology | Admitting: Interventional Cardiology

## 2012-12-03 ENCOUNTER — Ambulatory Visit (INDEPENDENT_AMBULATORY_CARE_PROVIDER_SITE_OTHER): Payer: BC Managed Care – PPO | Admitting: Surgery

## 2012-12-03 VITALS — BP 158/69 | HR 89 | Resp 20 | Ht 59.5 in | Wt 134.0 lb

## 2012-12-03 DIAGNOSIS — R911 Solitary pulmonary nodule: Secondary | ICD-10-CM

## 2012-12-03 DIAGNOSIS — Z5189 Encounter for other specified aftercare: Secondary | ICD-10-CM | POA: Insufficient documentation

## 2012-12-03 DIAGNOSIS — J984 Other disorders of lung: Secondary | ICD-10-CM

## 2012-12-03 DIAGNOSIS — I2510 Atherosclerotic heart disease of native coronary artery without angina pectoris: Secondary | ICD-10-CM | POA: Insufficient documentation

## 2012-12-03 DIAGNOSIS — I700 Atherosclerosis of aorta: Secondary | ICD-10-CM | POA: Insufficient documentation

## 2012-12-03 DIAGNOSIS — C3490 Malignant neoplasm of unspecified part of unspecified bronchus or lung: Secondary | ICD-10-CM | POA: Insufficient documentation

## 2012-12-03 DIAGNOSIS — Z9861 Coronary angioplasty status: Secondary | ICD-10-CM | POA: Insufficient documentation

## 2012-12-03 NOTE — Progress Notes (Signed)
[address]       Teresa [name] [address]             [phone]        HPI:  Is to return to see me today for followup of the upper lobe nodular density initially seen on chest x-ray and followed by CT scan. She was admitted to the hospital in April 2014 after a dog bite to her right arm causing cellulitis and a followup CT scan showed the right upper lobe density to increase in size than the prior year. There were also some groundglass opacities in the posterior aspect of the right upper lobe patchy densities bilaterally of unknown etiology. She had some recent increase cough and shortness of breath starting about 2 weeks prior. She was discharged and had a outpatient PET scan which showed the irregular right apical density to have a maximum SUV of 4.3 concerning for bronchogenic carcinoma. There were no other areas of hypermetabolism noted on the scan. She had pulmonary function testing done around this time which showed an FEV1 of 0.96 which was 51% predicted with no improvement with bronchodilator. Diffusion capacity was 78%. She said that at the time the pulmonary function tests were done she was having significant pulmonary symptoms that she felt were an exacerbation of her emphysema. She was sent for cardiac evaluation due to a history of moderate to severe aortic stenosis by echocardiogram 2 years prior. She was seen by Dr. Eldridge Dace and underwent cardiac catheterization showing a 95% mid LAD stenosis that was within a heavily calcified and diseased segment. Left ventricular ejection fraction was 45% with apical akinesis. Repeat pulmonary function testing shows an FEV1 of 1.32 to which was 61% predicted. There is no significant improvement with bronchodilators. His corrected diffusion capacity was 93% predicted. She subsequently underwent a complicated PCI with rotational atherectomy leading to dissection that propagated backwards and the left main and left circumflex. This  required a total of 7 stents and an intra-aortic balloon pump during the procedure. She recovered and was discharged on dual antiplatelet therapy with aspirin and Brilinta. She said that she has been going to cardiac rehabilitation and was doing fairly well until the past week or so when she has begun having substernal chest pressure radiating down arms and elbows associated with shortness of breath. She said this has occurred as she has increased her activity level during cardiac rehabilitation. The symptoms are completely relieved with rest. She has had no new symptoms at home doing normal activity.   Current Outpatient Prescriptions  Medication Sig Dispense Refill  . acetaminophen (TYLENOL) 500 MG tablet Take 1,000 mg by mouth every 6 (six) hours as needed for pain.      [name] albuterol (PROVENTIL HFA) 108 (90 BASE) MCG/ACT inhaler Inhale 2 puffs into the lungs every 6 (six) hours as needed for shortness of breath. For wheezing      . aspirin EC 81 MG tablet Take 81 mg by mouth daily.      . cholecalciferol (VITAMIN D) 1000 UNITS tablet Take 1,000 Units by mouth daily.      . Coenzyme Q10 (CO Q 10) 100 MG CAPS Take 1 capsule by mouth daily.      . Misc Natural Products (BLACK COHOSH MENOPAUSE COMPLEX) TABS Take 1 tablet by mouth daily. [facility] Menopause Support      . pantoprazole (PROTONIX) 40 MG tablet Take 40 mg by mouth daily.      [name]  rosuvastatin (CRESTOR) 10 MG tablet Take 10 mg by mouth every Monday, Wednesday, and Friday. 1 tablet at bedtime three times per wk      . Ticagrelor (BRILINTA) 90 MG TABS tablet Take 1 tablet (90 mg total) by mouth 2 (two) times daily.  60 tablet  11   No current facility-administered medications for this visit.     Physical Exam: BP 158/69  Pulse 89  Resp 20  Ht 4' 11.5" (1.511 m)  Wt 134 lb (60.782 kg)  BMI 26.62 kg/m2  SpO2 98%  LMP 10/04/1999 She looks well There is no cervical or supraclavicular adenopathy Lungs are clear Cardiac exam shows a regular  rate and rhythm, 2/6 systolic murmur over aorta No peripheral edema  Diagnostic Tests:  CLINICAL DATA: Cough, shortness of breath, and mid chest pain.  EXAM:  CHEST 2 VIEW  COMPARISON: 06/25/2012 and 11/07/2011  FINDINGS:  The heart size and pulmonary vascularity are normal. Chronic  accentuation of the interstitial markings stable scarring in the  right lung apex. No effusions. Numerous coronary artery stents are  noted. No osseous abnormality.  IMPRESSION:  No acute abnormality. Stable scarring in the right apex.  Electronically Signed  By: Geanie Cooley  On: 12/03/2012 09:11   Impression:  She has a right upper lobe lung nodule that is hypermetabolic on PET scan and suspicious for a primary bronchogenic carcinoma. She has underwent a complicated LAD PCI for a high grade stenosis to get her ready for the surgical resection and now has 7 stents requiring dual antiplatelet therapy for the long term. She is now reporting exertional substernal chest pressure and pain radiating down to elbows and associated with dyspnea. These symptoms have just started occuring since she is doing more exertion at cardiac rehab. They are relieved quickly with rest. I am concerned about the possibility of recurrent stenosis. I discussed this with Dr. Eldridge Dace and he will contact the patient to schedule another cath to evaluate this. I think a repeat CT scan of the chest is indicated to reassess the lung lesion since it was not seen by radiology on the cxr today. I suspect it is still there but has not grown much since her last scan in June.  Plan:  If her cath is ok I will plan to do lung surgery in the next month after evaluating another CT of the chest. She will need bridging in the hospital with integrillin.

## 2012-12-03 NOTE — Progress Notes (Signed)
Pt arrived at cardiac rehab today reporting episode of chest discomfort yesterday with pain radiating down arm while walking briskly downhill.  Pt describes this episode as different from her previous "heartburn" symptoms described at cardiac rehab. Pt states she discussed this with Dr. Laneta Simmers this morning. Pt states she was advised per Dr. Eldridge Dace to stop prevacid and start protonix to try and alleviate heartburn symptoms.  Pt states she has not started the protonix yet as it has not been received from mail order pharmacy. Pt reports she feels well today.  Prior to arriving at cardiac rehab she took prevacid, zantac OTC and used her rescue inhaler.  Pt able to exercise without symptoms today.  VSS.

## 2012-12-05 ENCOUNTER — Encounter (HOSPITAL_COMMUNITY): Payer: BC Managed Care – PPO

## 2012-12-05 ENCOUNTER — Encounter: Payer: Self-pay | Admitting: Cardiology

## 2012-12-05 ENCOUNTER — Encounter: Payer: Self-pay | Admitting: Interventional Cardiology

## 2012-12-05 ENCOUNTER — Ambulatory Visit (INDEPENDENT_AMBULATORY_CARE_PROVIDER_SITE_OTHER): Payer: BC Managed Care – PPO | Admitting: Interventional Cardiology

## 2012-12-05 VITALS — BP 140/70 | HR 98 | Ht 59.5 in

## 2012-12-05 DIAGNOSIS — I2510 Atherosclerotic heart disease of native coronary artery without angina pectoris: Secondary | ICD-10-CM

## 2012-12-05 DIAGNOSIS — I209 Angina pectoris, unspecified: Secondary | ICD-10-CM

## 2012-12-05 DIAGNOSIS — R0602 Shortness of breath: Secondary | ICD-10-CM

## 2012-12-05 LAB — CBC WITH DIFFERENTIAL/PLATELET
Basophils Absolute: 0 10*3/uL (ref 0.0–0.1)
Basophils Relative: 0.5 % (ref 0.0–3.0)
Eosinophils Absolute: 0.2 10*3/uL (ref 0.0–0.7)
Lymphocytes Relative: 27.9 % (ref 12.0–46.0)
MCHC: 33.8 g/dL (ref 30.0–36.0)
MCV: 90.6 fl (ref 78.0–100.0)
Monocytes Absolute: 0.5 10*3/uL (ref 0.1–1.0)
Neutrophils Relative %: 59.2 % (ref 43.0–77.0)
Platelets: 300 10*3/uL (ref 150.0–400.0)
RBC: 4.1 Mil/uL (ref 3.87–5.11)

## 2012-12-05 LAB — BASIC METABOLIC PANEL WITH GFR
BUN: 15 mg/dL (ref 6–23)
CO2: 29 meq/L (ref 19–32)
Calcium: 9.4 mg/dL (ref 8.4–10.5)
Chloride: 104 meq/L (ref 96–112)
Creatinine, Ser: 0.6 mg/dL (ref 0.4–1.2)
GFR: 104.43 mL/min
Glucose, Bld: 105 mg/dL — ABNORMAL HIGH (ref 70–99)
Potassium: 4 meq/L (ref 3.5–5.1)
Sodium: 138 meq/L (ref 135–145)

## 2012-12-05 LAB — PROTIME-INR: Prothrombin Time: 10.8 s (ref 10.2–12.4)

## 2012-12-05 MED ORDER — NITROGLYCERIN 0.4 MG SL SUBL: 0.4000 mg | SUBLINGUAL_TABLET | SUBLINGUAL | Status: DC | PRN

## 2012-12-05 NOTE — Patient Instructions (Addendum)
Your physician recommends that you continue on your current medications as directed. Please refer to the Current Medication list given to you today.  Your physician has requested that you have a cardiac catheterization. Cardiac catheterization is used to diagnose and/or treat various heart conditions. Doctors may recommend this procedure for a number of different reasons. The most common reason is to evaluate chest pain. Chest pain can be a symptom of coronary artery disease (CAD), and cardiac catheterization can show whether plaque is narrowing or blocking your heart's arteries. This procedure is also used to evaluate the valves, as well as measure the blood flow and oxygen levels in different parts of your heart. For further information please visit [email or web address] Please follow instruction sheet, as given.   Your physician recommends that you return for lab work in: today for pre-cath labwork: cbc, bmet and pt/inr.  Follow up as scheduled.   Rx for [facility] sent to [facility].

## 2012-12-05 NOTE — Progress Notes (Signed)
Patient ID: Teresa [name], female   DOB: 11/19/1950, 61 y.o.   MRN: [id number]    [address], [address] [place], [place]  [address] Phone: [phone] Fax:  [phone]  Date:  12/05/2012   ID:  Amire M Axel, DOB 01/23/1951, MRN [id number]  PCP:  OSBORNE,JAMES CHARLES, MD      History of Present Illness: Teresa [name] is a 61 y.o. female who had a cath 2 months ago which was complicated by a dissection. She required seven stents in the LAD, left main and circumflex.  Over the past few weeks, she has had exertional SHOB and some indigestion at that time.   Symptoms were improved with walking slower or by stopping. This pattern has persisted over the past few weeks. She has not tried any nitroglycerin. Her lung lesion appeared smaller on chest x-ray. She was still likely need removal of this nodule at some point in the future. She has a CT scan planned later next week. I spoke with Dr. Bartle about her and we agreed that she needs an ischemia workup prior to any lung surgery.   Wt Readings from Last 3 Encounters:  12/03/12 134 lb (60.782 kg)  10/30/12 128 lb 8.5 oz (58.3 kg)  10/10/12 129 lb 13.6 oz (58.9 kg)     Past Medical History  Diagnosis Date  . GERD (gastroesophageal reflux disease)   . Hypercholesterolemia   . Seasonal allergies   . Tobacco abuse   . Cataract     "? right" (06/24/2012)  . Aortic valve stenosis, moderate   . Heart murmur   . Mass of lung     "right spot; they are watching it" (06/24/2012)  . Cellulitis 06/20/2012    "dog bite; right forearm" (06/24/2012)  . Arthritis     "across my hips; comes w/the weather" (06/24/2012)  . Nonspecific abnormal unspecified cardiovascular function study   . Peripheral vascular disease, unspecified   . CAD (coronary artery disease), native coronary artery     Current Outpatient Prescriptions  Medication Sig Dispense Refill  . acetaminophen (TYLENOL) 500 MG tablet Take 1,000 mg by mouth 3 (three) times daily  as needed for pain.       . albuterol (PROVENTIL HFA) 108 (90 BASE) MCG/ACT inhaler Inhale 2 puffs into the lungs 2 (two) times daily as needed for wheezing or shortness of breath.       . aspirin EC 81 MG tablet Take 81 mg by mouth daily.      . cholecalciferol (VITAMIN D) 1000 UNITS tablet Take 1,000 Units by mouth at bedtime.       . Coenzyme Q10 (CO Q 10) 100 MG CAPS Take 100 mg by mouth at bedtime.       . lansoprazole (PREVACID) 30 MG capsule Take 30 mg by mouth daily with lunch.      . NASAL SALINE NA Place into the nose as directed.      . NON FORMULARY OTC Red oil      . OVER THE COUNTER MEDICATION Take 1 tablet by mouth daily with lunch. [facility] Menopause support      . ranitidine (ZANTAC) 150 MG tablet Take 150 mg by mouth as needed for heartburn.      . rosuvastatin (CRESTOR) 10 MG tablet Take 10 mg by mouth 3 (three) times a week. Monday, Wednesday, Friday      . Ticagrelor (BRILINTA) 90 MG TABS tablet Take 1 tablet (90 mg total) by mouth   2 (two) times daily.  60 tablet  11   No current facility-administered medications for this visit.    Allergies:    Allergies  Allergen Reactions  . Azithromycin Shortness Of Breath  . Ceclor [Cefaclor] Shortness Of Breath  . Morphine And Related     Terrible headache  . Penicillins Other (See Comments)    Unknown from childhood  . Vicodin [Hydrocodone-Acetaminophen] Nausea And Vomiting  . Septra [Sulfamethoxazole-Trimethoprim] Rash  . Adhesive [Tape] Other (See Comments)    Redness and swelling - use paper tape  . Doxycycline Other (See Comments)    Redness on the face    Social History:  The patient  reports that she quit smoking about 5 months ago. Her smoking use included Cigarettes. She has a 20 pack-year smoking history. She has never used smokeless tobacco. She reports that she does not drink alcohol or use illicit drugs.   Family History:  The patient's family history includes Alcohol abuse in her father.   ROS:  Please see the  history of present illness.  No nausea, vomiting.  No fevers, chills.  No focal weakness.  No dysuria. Heartburn sx as noted above. Persistent right groin pain.  All other systems reviewed and negative.   PHYSICAL EXAM: VS:  BP 140/70  Pulse 98  Ht 4' 11.5" (1.511 m)  LMP 10/04/1999 Well nourished, well developed, in no acute distress HEENT: normal Neck: no JVD, no carotid bruits Cardiac:  normal S1, S2; RRR;  Lungs:  clear to auscultation bilaterally, no wheezing, rhonchi or rales Abd: soft, nontender, no hepatomegaly Ext: no edema, 2+ right radial , 2+ right femoral pulse, no bruising or hematoma, 2+ right dorsalis pedis pulse Skin: warm and dry Neuro:   no focal abnormalities noted  EKG:  NSR, no ST segment changes     ASSESSMENT AND PLAN:  1. CAD: Symptoms concerning for angina. Plan for cardiac cath. All questions answered. Continue Brilinta during the Procedure.  Send in prescription for sublingual nitroglycerin electronically. 2. Shortness of breath: This may be an anginal equivalent.  Signed, Jay S. Noelia Lenart, MD, FACC 12/05/2012 11:20 AM   

## 2012-12-08 ENCOUNTER — Encounter (HOSPITAL_COMMUNITY): Payer: Self-pay | Admitting: Interventional Cardiology

## 2012-12-08 ENCOUNTER — Encounter (HOSPITAL_COMMUNITY)
Admission: RE | Disposition: A | Discharge status: Home / Self Care | Payer: Self-pay | Source: Ambulatory Visit | Attending: Interventional Cardiology

## 2012-12-08 ENCOUNTER — Encounter (HOSPITAL_COMMUNITY): Admission: RE | Admit: 2012-12-08 | Payer: BC Managed Care – PPO | Source: Ambulatory Visit

## 2012-12-08 ENCOUNTER — Ambulatory Visit (HOSPITAL_COMMUNITY)
Admission: RE | Admit: 2012-12-08 | Discharge: 2012-12-09 | Disposition: A | Discharge status: Home / Self Care | Payer: BC Managed Care – PPO | Source: Ambulatory Visit | Attending: Interventional Cardiology | Admitting: Interventional Cardiology

## 2012-12-08 DIAGNOSIS — Z9861 Coronary angioplasty status: Secondary | ICD-10-CM | POA: Insufficient documentation

## 2012-12-08 DIAGNOSIS — I359 Nonrheumatic aortic valve disorder, unspecified: Secondary | ICD-10-CM | POA: Insufficient documentation

## 2012-12-08 DIAGNOSIS — I2510 Atherosclerotic heart disease of native coronary artery without angina pectoris: Secondary | ICD-10-CM | POA: Insufficient documentation

## 2012-12-08 DIAGNOSIS — I209 Angina pectoris, unspecified: Secondary | ICD-10-CM | POA: Insufficient documentation

## 2012-12-08 DIAGNOSIS — Z79899 Other long term (current) drug therapy: Secondary | ICD-10-CM | POA: Insufficient documentation

## 2012-12-08 DIAGNOSIS — I200 Unstable angina: Secondary | ICD-10-CM | POA: Insufficient documentation

## 2012-12-08 DIAGNOSIS — C3490 Malignant neoplasm of unspecified part of unspecified bronchus or lung: Secondary | ICD-10-CM | POA: Insufficient documentation

## 2012-12-08 DIAGNOSIS — I350 Nonrheumatic aortic (valve) stenosis: Secondary | ICD-10-CM

## 2012-12-08 HISTORY — PX: LEFT HEART CATHETERIZATION WITH CORONARY ANGIOGRAM: SHX5451

## 2012-12-08 SURGERY — LEFT HEART CATHETERIZATION WITH CORONARY ANGIOGRAM
Anesthesia: LOCAL

## 2012-12-08 MED ORDER — TICAGRELOR 90 MG PO TABS: 90.0000 mg | ORAL_TABLET | Freq: Two times a day (BID) | ORAL | Status: DC

## 2012-12-08 MED ORDER — ONDANSETRON HCL 4 MG/2ML IJ SOLN: 4.0000 mg | Freq: Four times a day (QID) | INTRAMUSCULAR | Status: DC | PRN

## 2012-12-08 MED ORDER — SODIUM CHLORIDE 0.9 % IV SOLN
1.0000 mL/kg/h | INTRAVENOUS | Status: AC
  Administered 2012-12-08: 1 mL/kg/h via INTRAVENOUS

## 2012-12-08 MED ORDER — TICAGRELOR 90 MG PO TABS
90.0000 mg | ORAL_TABLET | Freq: Two times a day (BID) | ORAL | Status: DC
  Administered 2012-12-08: 90 mg via ORAL
  Filled 2012-12-08 (×3): qty 1

## 2012-12-08 MED ORDER — ATORVASTATIN CALCIUM 20 MG PO TABS
20.0000 mg | ORAL_TABLET | ORAL | Status: DC
  Administered 2012-12-08: 18:00:00 20 mg via ORAL
  Filled 2012-12-08 (×2): qty 1

## 2012-12-08 MED ORDER — SODIUM CHLORIDE 0.9 % IV SOLN
INTRAVENOUS | Status: DC
  Administered 2012-12-08: 07:00:00 via INTRAVENOUS

## 2012-12-08 MED ORDER — MIDAZOLAM HCL 2 MG/2ML IJ SOLN
INTRAMUSCULAR | Status: AC
  Filled 2012-12-08: qty 2

## 2012-12-08 MED ORDER — ASPIRIN 81 MG PO CHEW
81.0000 mg | CHEWABLE_TABLET | ORAL | Status: AC
  Administered 2012-12-08: 81 mg via ORAL

## 2012-12-08 MED ORDER — SODIUM CHLORIDE 0.9 % IV SOLN: 250.0000 mL | INTRAVENOUS | Status: DC | PRN

## 2012-12-08 MED ORDER — ADENOSINE 12 MG/4ML IV SOLN
12.0000 mL | Freq: Once | INTRAVENOUS | Status: AC
  Administered 2012-12-08: 36 mg via INTRAVENOUS
  Filled 2012-12-08: qty 12

## 2012-12-08 MED ORDER — HEPARIN SODIUM (PORCINE) 1000 UNIT/ML IJ SOLN
INTRAMUSCULAR | Status: AC
  Filled 2012-12-08: qty 1

## 2012-12-08 MED ORDER — FENTANYL CITRATE 0.05 MG/ML IJ SOLN
INTRAMUSCULAR | Status: AC
  Filled 2012-12-08: qty 2

## 2012-12-08 MED ORDER — VERAPAMIL HCL 2.5 MG/ML IV SOLN
INTRAVENOUS | Status: AC
  Filled 2012-12-08: qty 2

## 2012-12-08 MED ORDER — NITROGLYCERIN 0.2 MG/ML ON CALL CATH LAB
INTRAVENOUS | Status: AC
  Filled 2012-12-08: qty 1

## 2012-12-08 MED ORDER — FAMOTIDINE 20 MG PO TABS
20.0000 mg | ORAL_TABLET | Freq: Every day | ORAL | Status: DC
  Filled 2012-12-08: qty 1

## 2012-12-08 MED ORDER — ACETAMINOPHEN 325 MG PO TABS: 650.0000 mg | ORAL_TABLET | ORAL | Status: DC | PRN

## 2012-12-08 MED ORDER — ASPIRIN EC 81 MG PO TBEC
81.0000 mg | DELAYED_RELEASE_TABLET | Freq: Every day | ORAL | Status: DC
  Filled 2012-12-08: qty 1

## 2012-12-08 MED ORDER — VITAMIN D3 25 MCG (1000 UNIT) PO TABS
1000.0000 [IU] | ORAL_TABLET | Freq: Every day | ORAL | Status: DC
  Filled 2012-12-08 (×3): qty 1

## 2012-12-08 MED ORDER — ATROPINE SULFATE 1 MG/ML IJ SOLN
INTRAMUSCULAR | Status: AC
  Filled 2012-12-08: qty 1

## 2012-12-08 MED ORDER — SODIUM CHLORIDE 0.9 % IJ SOLN: 3.0000 mL | Freq: Two times a day (BID) | INTRAMUSCULAR | Status: DC

## 2012-12-08 MED ORDER — HEPARIN (PORCINE) IN NACL 2-0.9 UNIT/ML-% IJ SOLN
INTRAMUSCULAR | Status: AC
  Filled 2012-12-08: qty 1500

## 2012-12-08 MED ORDER — ACETAMINOPHEN 500 MG PO TABS
1000.0000 mg | ORAL_TABLET | Freq: Three times a day (TID) | ORAL | Status: DC | PRN
  Administered 2012-12-08: 15:00:00 1000 mg via ORAL
  Filled 2012-12-08: qty 2

## 2012-12-08 MED ORDER — ALBUTEROL SULFATE HFA 108 (90 BASE) MCG/ACT IN AERS
2.0000 | INHALATION_SPRAY | Freq: Two times a day (BID) | RESPIRATORY_TRACT | Status: DC | PRN
  Filled 2012-12-08: qty 6.7

## 2012-12-08 MED ORDER — SODIUM CHLORIDE 0.9 % IJ SOLN: 3.0000 mL | INTRAMUSCULAR | Status: DC | PRN

## 2012-12-08 MED ORDER — LIDOCAINE HCL (PF) 1 % IJ SOLN
INTRAMUSCULAR | Status: AC
  Filled 2012-12-08: qty 30

## 2012-12-08 MED ORDER — NITROGLYCERIN 0.4 MG SL SUBL: 0.4000 mg | SUBLINGUAL_TABLET | SUBLINGUAL | Status: DC | PRN

## 2012-12-08 MED ORDER — PANTOPRAZOLE SODIUM 40 MG PO TBEC
40.0000 mg | DELAYED_RELEASE_TABLET | Freq: Every day | ORAL | Status: DC
  Administered 2012-12-08: 40 mg via ORAL
  Filled 2012-12-08 (×2): qty 1

## 2012-12-08 MED ORDER — ASPIRIN 81 MG PO CHEW
CHEWABLE_TABLET | ORAL | Status: AC
  Filled 2012-12-08: qty 1

## 2012-12-08 MED ORDER — ASPIRIN 81 MG PO CHEW: 81.0000 mg | CHEWABLE_TABLET | Freq: Every day | ORAL | Status: DC

## 2012-12-08 NOTE — Interval H&P Note (Signed)
History and Physical Interval Note:  12/08/2012 9:14 AM  Teresa [name]  has presented today for surgery, with the diagnosis of [place]  The various methods of treatment have been discussed with the patient and family. After consideration of risks, benefits and other options for treatment, the patient has consented to  Procedure(s): LEFT HEART CATHETERIZATION WITH CORONARY ANGIOGRAM (N/A) as a surgical intervention .  The patient's history has been reviewed, patient examined, no change in status, stable for surgery.  I have reviewed the patient's chart and labs.  Questions were answered to the patient's satisfaction.    Cath Lab Visit (complete for each Cath Lab visit)  Clinical Evaluation Leading to the Procedure:   ACS: no  Non-ACS:    Anginal Classification: CCS III  Anti-ischemic medical therapy: Minimal Therapy (1 class of medications)  Non-Invasive Test Results: No non-invasive testing performed  Prior CABG: No previous CABG        Ehan Freas S.

## 2012-12-08 NOTE — Progress Notes (Signed)
TR BAND REMOVAL  LOCATION:    right radial  DEFLATED PER PROTOCOL:    yes  TIME BAND OFF / DRESSING APPLIED:    1430   SITE UPON ARRIVAL:    Level 0  SITE AFTER BAND REMOVAL:    Level 0  REVERSE ALLEN'S TEST:     positive  CIRCULATION SENSATION AND MOVEMENT:    Within Normal Limits   yes  COMMENTS:  Tolerated procedure well 

## 2012-12-08 NOTE — CV Procedure (Addendum)
PROCEDURE:  Left heart catheterization with selective coronary angiography, left ventriculogram.  PCI RCA  INDICATIONS:  Class III angina  The risks, benefits, and details of the procedure were explained to the patient.  The patient verbalized understanding and wanted to proceed.  Informed written consent was obtained.  PROCEDURE TECHNIQUE:  After Xylocaine anesthesia a 60F slender sheath was placed in the right radial artery with a single anterior needle wall stick.   Right coronary angiography was done using a Judkins R4 guide catheter.  Left coronary angiography was done using a Judkins L3.5 guide catheter.  Left ventriculography was done using a pigtail catheter.  A TR band was used for hemostasis.   CONTRAST:  Total of 170 cc.  COMPLICATIONS:  None.    HEMODYNAMICS:  Aortic pressure was 155/77; LV pressure was 167/11; LVEDP 21.  There was no gradient between the left ventricle and aorta.    ANGIOGRAPHIC DATA:   The left main coronary artery stent is widely patent.  The left anterior descending artery is stented from the proximal to mid section. The stents are widely patent. The mid to distal LAD is small in caliber but patent.  There are several small diagonals which are widely patent.  The left circumflex artery is a large vessel proximally. This vessel is also stented from the proximal to midportion. The stents are widely patent. At the distal edge of the stent, there is a mild, 25% lesion which is likely be healed dissection from her prior catheterization. It does not appear flow-limiting in any view.  The right coronary artery is a medium size dominant vessel. There is a moderate, 40-50% proximal lesion had been just after a RV marginal branch. In the mid vessel, there is a focal 80% lesion which is very short. The remainder of the RCA has mild disease. There is a medium-sized PDA and posterolateral artery which appear patent.  LEFT VENTRICULOGRAM:  Left ventricular angiogram  was done in the 30 RAO projection and revealed apical hypokinesis and systolic function with an estimated ejection fraction of 45%.  LVEDP was 21 mmHg.  PCI NARRATIVE: A JR 4 guiding catheter was placed in the ostium the right coronary artery. Additional heparin was given for anticoagulation since the patient was already on Brilinta. ACT was used to verify that the heparin was therapeutic.  Additional heparin was given during the procedure. Initially, a pro-water wire was advanced to the mid right coronary artery but would not cross the lesion. Subsequently, a fielder XT wire was used and successfully crossed the lesion in the mid right coronary artery. At this point, it was noted that there was diminished flow in the distal vessel. The initial plan of FloWire was abandoned due to the fact that she was now symptomatic from this acute subtotal occlusion, possibly from combination of wire dissection with significant lesion.  She became hypotensive and her heart rate dropped into the 40s. Her IV fluids were increased and a half amp of atropine was administered. Her heart rate and blood pressure both improved. A 2.0 x 12 balloon was used to predilate. This did not improve flow significantly. Subsequently a 2.25 x 20 balloon was used to treat the mid to distal vessel. TIMI-3 flow was restored. Initially, it appeared that the lesion was focal. A 2.5 by 16 promus drug-eluting stent was deployed. It appeared that there is more a dissection distal to the stent. A 2.5 x 12 promus stent was then deployed distally.  There is still significant haziness even further distal. An additional 2.5 x 12 stent was deployed and there was no further evidence of any dissection. Several doses of intracoronary nitroglycerin were administered. There is an excellent angiographic result no residual stenosis. The patient's symptoms resolved completely.  IMPRESSIONS:  1. Widely patent stents in the left main coronary artery. 2. Widely  patent stents in the left anterior descending artery. 3. Widely patent stents in the left circumflex artery and its branches. 4. 80% mid vessel lesion in the right coronary artery.  Successful overlapping drug-eluting stent placement in the mid to distal right coronary artery. 5. Mildly decreased left ventricular systolic function with apical hypokinesis.  LVEDP 21 mmHg.  Ejection fraction 45 %.  Mild aortic valve gradient.  RECOMMENDATION:  Continue dual antiplatelet therapy indefinitely. We'll speak to Dr. Laneta Simmers about lung cancer resection in the future. She needs to continue to abstain from smoking. Continue aggressive secondary prevention.[name]

## 2012-12-08 NOTE — H&P (View-Only) (Signed)
Patient ID: Teresa [name], female   DOB: Jan 27, 1951, 62 y.o.   MRN: [id number]    [address] [place], [place]  [address] Phone: [phone] Fax:  [phone]  Date:  12/05/2012   ID:  Teresa, [name] Jan 28, 1951, MRN [id number]  PCP:  Darnelle Bos, MD      History of Present Illness: HELI DINO is a 62 y.o. female who had a cath 2 months ago which was complicated by a dissection. She required seven stents in the LAD, left main and circumflex.  Over the past few weeks, she has had exertional SHOB and some indigestion at that time.   Symptoms were improved with walking slower or by stopping. This pattern has persisted over the past few weeks. She has not tried any nitroglycerin. Her lung lesion appeared smaller on chest x-ray. She was still likely need removal of this nodule at some point in the future. She has a CT scan planned later next week. I spoke with Dr. Laneta Simmers about her and we agreed that she needs an ischemia workup prior to any lung surgery.   Wt Readings from Last 3 Encounters:  12/03/12 134 lb (60.782 kg)  10/30/12 128 lb 8.5 oz (58.3 kg)  10/10/12 129 lb 13.6 oz (58.9 kg)     Past Medical History  Diagnosis Date  . GERD (gastroesophageal reflux disease)   . Hypercholesterolemia   . Seasonal allergies   . Tobacco abuse   . Cataract     "? right" (06/24/2012)  . Aortic valve stenosis, moderate   . Heart murmur   . Mass of lung     "right spot; they are watching it" (06/24/2012)  . Cellulitis 06/20/2012    "dog bite; right forearm" (06/24/2012)  . Arthritis     "across my hips; comes w/the weather" (06/24/2012)  . Nonspecific abnormal unspecified cardiovascular function study   . Peripheral vascular disease, unspecified   . CAD (coronary artery disease), native coronary artery     Current Outpatient Prescriptions  Medication Sig Dispense Refill  . acetaminophen (TYLENOL) 500 MG tablet Take 1,000 mg by mouth 3 (three) times daily  as needed for pain.       [name] albuterol (PROVENTIL HFA) 108 (90 BASE) MCG/ACT inhaler Inhale 2 puffs into the lungs 2 (two) times daily as needed for wheezing or shortness of breath.       [name] aspirin EC 81 MG tablet Take 81 mg by mouth daily.      . cholecalciferol (VITAMIN D) 1000 UNITS tablet Take 1,000 Units by mouth at bedtime.       . Coenzyme Q10 (CO Q 10) 100 MG CAPS Take 100 mg by mouth at bedtime.       . lansoprazole (PREVACID) 30 MG capsule Take 30 mg by mouth daily with lunch.      [name] NASAL SALINE NA Place into the nose as directed.      . NON FORMULARY OTC Red oil      . OVER THE COUNTER MEDICATION Take 1 tablet by mouth daily with lunch. [facility] Menopause support      . ranitidine (ZANTAC) 150 MG tablet Take 150 mg by mouth as needed for heartburn.      . rosuvastatin (CRESTOR) 10 MG tablet Take 10 mg by mouth 3 (three) times a week. Monday, Wednesday, Friday      . Ticagrelor (BRILINTA) 90 MG TABS tablet Take 1 tablet (90 mg total) by mouth  2 (two) times daily.  60 tablet  11   No current facility-administered medications for this visit.    Allergies:    Allergies  Allergen Reactions  . Azithromycin Shortness Of Breath  . Ceclor [Cefaclor] Shortness Of Breath  . Morphine And Related     Terrible headache  . Penicillins Other (See Comments)    Unknown from childhood  . Vicodin [Hydrocodone-Acetaminophen] Nausea And Vomiting  . Septra [Sulfamethoxazole-Trimethoprim] Rash  . Adhesive [Tape] Other (See Comments)    Redness and swelling - use paper tape  . Doxycycline Other (See Comments)    Redness on the face    Social History:  The patient  reports that she quit smoking about 5 months ago. Her smoking use included Cigarettes. She has a 20 pack-year smoking history. She has never used smokeless tobacco. She reports that she does not drink alcohol or use illicit drugs.   Family History:  The patient's family history includes Alcohol abuse in her father.   ROS:  Please see the  history of present illness.  No nausea, vomiting.  No fevers, chills.  No focal weakness.  No dysuria. Heartburn sx as noted above. Persistent right groin pain.  All other systems reviewed and negative.   PHYSICAL EXAM: VS:  BP 140/70  Pulse 98  Ht 4' 11.5" (1.511 m)  LMP 10/04/1999 Well nourished, well developed, in no acute distress HEENT: normal Neck: no JVD, no carotid bruits Cardiac:  normal S1, S2; RRR;  Lungs:  clear to auscultation bilaterally, no wheezing, rhonchi or rales Abd: soft, nontender, no hepatomegaly Ext: no edema, 2+ right radial , 2+ right femoral pulse, no bruising or hematoma, 2+ right dorsalis pedis pulse Skin: warm and dry Neuro:   no focal abnormalities noted  EKG:  NSR, no ST segment changes     ASSESSMENT AND PLAN:  1. CAD: Symptoms concerning for angina. Plan for cardiac cath. All questions answered. Continue Brilinta during the Procedure.  Send in prescription for sublingual nitroglycerin electronically. 2. Shortness of breath: This may be an anginal equivalent.  Signed, Fredric Mare, MD, [facility] 12/05/2012 11:20 AM

## 2012-12-09 ENCOUNTER — Telehealth (HOSPITAL_COMMUNITY): Payer: Self-pay | Admitting: Cardiac Rehabilitation

## 2012-12-09 DIAGNOSIS — I209 Angina pectoris, unspecified: Secondary | ICD-10-CM

## 2012-12-09 DIAGNOSIS — I359 Nonrheumatic aortic valve disorder, unspecified: Secondary | ICD-10-CM

## 2012-12-09 LAB — BASIC METABOLIC PANEL
BUN: 14 mg/dL (ref 6–23)
Calcium: 9.2 mg/dL (ref 8.4–10.5)
Creatinine, Ser: 0.6 mg/dL (ref 0.50–1.10)
GFR calc Af Amer: >90 mL/min (ref >90)
GFR calc non Af Amer: >90 mL/min (ref >90)
Glucose, Bld: 102 mg/dL — ABNORMAL HIGH (ref 70–99)
Potassium: 4.2 mEq/L (ref 3.5–5.1)
Sodium: 139 mEq/L (ref 135–145)

## 2012-12-09 LAB — CBC
HCT: 35.4 % — ABNORMAL LOW (ref 36.0–46.0)
Hemoglobin: 11.7 g/dL — ABNORMAL LOW (ref 12.0–15.0)
MCH: 30.5 pg (ref 26.0–34.0)
MCHC: 33.1 g/dL (ref 30.0–36.0)
Platelets: 253 10*3/uL (ref 150–400)
RDW: 13.6 % (ref 11.5–15.5)

## 2012-12-09 NOTE — Progress Notes (Signed)
[phone] Pt had already walked independently so I did not walk with her. Brief ed done since pt in CRP 2. Briefly reviewed stent/brilinta, NTG use, ex ed. Pt has attended 2 diet classes in outpatient cardiac rehab so only briefly discussed. Pt states she has not smoked since last admission. Will send update to CRP 2. Luetta Nutting RNBSN

## 2012-12-09 NOTE — Discharge Summary (Addendum)
Patient ID: Teresa [name] MRN: [id number] DOB/AGE: Mar 22, 1950 62 y.o.  Admit date: 12/08/2012 Discharge date: 12/09/2012  Primary Discharge Diagnosis Class III angina Secondary Discharge Diagnosis CAD, Aortic stenosis, lung cancer  Significant Diagnostic Studies: angiography: Patent left sided stents.  80% mid RCA lesion which was stented with overlapping drug eluting stents. Mild aortic valve gradiant on pullback.  LVEF 45%.  Consults: None  Hospital Course: 62 y/o who has CAD and had recurrent angina at cardiac rehab.  She had a cath with PCI to RCA from the radial approach.  No bleeding problems or further angina post procedure.  She walked with cardiac rehab without difficulty on the morning of d/c.  We discussed continued smoking cessation and risk factor modification.   Discharge Exam: Blood pressure 129/60, pulse 93, temperature 98.3 F (36.8 C), temperature source Oral, resp. rate 20, height 4\' 11"  (1.499 m), weight 134 lb 7.7 oz (61 kg), last menstrual period 10/04/1999, SpO2 97.00%.  Roberts/AT RRR, S1 S2 2/6 systolic murmur No wheezing Soft NT ND No edema 3+ right radial pulse Labs:   Lab Results  Component Value Date   WBC 5.0 12/09/2012   HGB 11.7* 12/09/2012   HCT 35.4* 12/09/2012   MCV 92.4 12/09/2012   PLT 253 12/09/2012    Recent Labs Lab 12/09/12 0600  NA 139  K 4.2  CL 103  CO2 26  BUN 14  CREATININE 0.60  CALCIUM 9.2  GLUCOSE 102*   No results found for this basename: CKTOTAL, CKMB, CKMBINDEX, TROPONINI    No results found for this basename: CHOL   No results found for this basename: HDL   No results found for this basename: LDLCALC   No results found for this basename: TRIG   No results found for this basename: CHOLHDL   No results found for this basename: LDLDIRECT      Radiology:none EKG: NSR, no ST segment changes  FOLLOW UP PLANS AND APPOINTMENTS  Future Appointments Provider Department Dept Phone   12/10/2012 10:10 AM [facility] [phone]   Patient to arrive 15 minutes prior to appointment time.   12/10/2012 11:30 AM Alleen Borne, MD [facility] [phone]   12/10/2012 1:15 PM [facility] Monitor [facility] CARDIAC [facility] [phone]   12/12/2012 1:15 PM Mc-Phase2 Monitor 18 [facility] CARDIAC [facility] [phone]   12/15/2012 1:15 PM Mc-Phase2 Monitor 18 [facility] CARDIAC [facility] [phone]   12/17/2012 1:15 PM Mc-Phase2 Monitor 18 [facility] CARDIAC [facility] [phone]   12/19/2012 1:15 PM Mc-Phase2 Monitor 18 [facility] CARDIAC [facility] [phone]   12/22/2012 1:15 PM Mc-Phase2 Monitor 18 [facility] CARDIAC [facility] [phone]   12/24/2012 1:15 PM Mc-Phase2 Monitor 18 [facility] CARDIAC [facility] [phone]   12/26/2012 1:15 PM Mc-Phase2 Monitor 18 [facility] CARDIAC [facility] [phone]   12/29/2012 1:15 PM Mc-Phase2 Monitor 18 [facility] CARDIAC [employer] [facility] [phone]   12/31/2012 1:15 PM Mc-Phase2 Monitor 18 [facility] CARDIAC [facility] [phone]   01/02/2013 1:15 PM Mc-Phase2 Monitor 18 [facility] CARDIAC [facility] [phone]   01/05/2013 1:15 PM Mc-Phase2 Monitor 18 [facility] CARDIAC [facility] [phone]   01/07/2013 1:15 PM Mc-Phase2 Monitor 18 [facility] CARDIAC [facility] [phone]   01/09/2013 1:15 PM Mc-Phase2 Monitor 18 [facility] CARDIAC [facility] [phone]   01/12/2013  1:15 PM Mc-Phase2 Monitor 18 [facility] CARDIAC [facility] [phone]   01/14/2013 1:15 PM Mc-Phase2 Monitor 18 [facility] CARDIAC [facility] [phone]   01/16/2013 1:15 PM Mc-Phase2 Monitor 18 [facility] CARDIAC [facility] [phone]   01/19/2013 1:15 PM Mc-Phase2 Monitor 18  [facility] CARDIAC [facility] [phone]   01/21/2013 1:15 PM Mc-Phase2 Monitor 18 [facility] CARDIAC [facility] [phone]   01/23/2013 1:15 PM Mc-Phase2 Monitor 18 [facility] CARDIAC [facility] [phone]   01/26/2013 1:15 PM Mc-Phase2 Monitor 18 [facility] CARDIAC [facility] [phone]   01/27/2013 1:45 PM Everette Rank, MD [facility] [phone]   01/28/2013 1:15 PM Mc-Phase2 Monitor 18 [facility] CARDIAC [facility] [phone]   01/30/2013 1:15 PM Mc-Phase2 Monitor 18 [facility] CARDIAC [facility] [phone]   02/02/2013 1:15 PM Mc-Phase2 Monitor 18 [facility] CARDIAC [facility] [phone]   02/04/2013 1:15 PM Mc-Phase2 Monitor 18 [facility] CARDIAC [facility] [phone]   02/06/2013 1:15 PM Mc-Phase2 Monitor 18 [facility] CARDIAC [facility] [phone]   02/09/2013 1:15 PM Mc-Phase2 Monitor 18 [facility] CARDIAC REHAB [phone]       Medication List         acetaminophen 500 MG tablet  Commonly known as:  TYLENOL  Take 1,000 mg by mouth 3 (three) times daily as needed for pain.     aspirin EC 81 MG tablet  Take 81 mg by mouth daily.     cholecalciferol 1000 UNITS tablet  Commonly known as:  VITAMIN D  Take 1,000 Units by mouth at bedtime.     Co Q 10 100 MG Caps  Take 100 mg by mouth at bedtime.     lansoprazole 30 MG capsule  Commonly known as:  PREVACID  Take 30 mg by mouth daily with lunch.     NASAL SALINE NA  Place into the nose as directed.     nitroGLYCERIN 0.4 MG SL tablet  Commonly known as:  NITROSTAT  Place 1 tablet (0.4 mg total) under the tongue every 5 (five) minutes as needed for chest pain.     NON FORMULARY  OTC Red oil     OVER THE COUNTER MEDICATION  Take 1 tablet by mouth daily with lunch. [facility] Menopause support     PROVENTIL HFA 108 (90 BASE) MCG/ACT inhaler  Generic drug:   albuterol  Inhale 2 puffs into the lungs 2 (two) times daily as needed for wheezing or shortness of breath.     ranitidine 150 MG tablet  Commonly known as:  ZANTAC  Take 150 mg by mouth as needed for heartburn.     rosuvastatin 10 MG tablet  Commonly known as:  CRESTOR  Take 10 mg by mouth 3 (three) times a week. Monday, Wednesday, Friday     Ticagrelor 90 MG Tabs tablet  Commonly known as:  BRILINTA  Take 1 tablet (90 mg total) by mouth 2 (two) times daily.           Follow-up Information   Follow up with Corky Crafts., MD. Schedule an appointment as soon as possible for a visit in 2 weeks.   Specialty:  Cardiology   Contact information:   [address] [place] [address] [phone]       BRING ALL MEDICATIONS WITH YOU TO FOLLOW UP APPOINTMENTS  Time spent with patient to include physician time:20 mintues Signed:  Zaion Hreha S. 12/09/2012, 8:39 AM

## 2012-12-09 NOTE — Telephone Encounter (Signed)
pc to pt to discuss recent hospitalization.  Pt states she is home and doing well.  Pt plans to return to cardiac rehab next week per Dr. Eldridge Dace instruction.

## 2012-12-10 ENCOUNTER — Encounter: Payer: Self-pay | Admitting: Surgery

## 2012-12-10 ENCOUNTER — Ambulatory Visit
Admission: RE | Admit: 2012-12-10 | Discharge: 2012-12-10 | Disposition: A | Discharge status: Home / Self Care | Payer: BC Managed Care – PPO | Source: Ambulatory Visit | Attending: Surgery | Admitting: Surgery

## 2012-12-10 ENCOUNTER — Ambulatory Visit (INDEPENDENT_AMBULATORY_CARE_PROVIDER_SITE_OTHER): Payer: BC Managed Care – PPO | Admitting: Surgery

## 2012-12-10 ENCOUNTER — Telehealth: Payer: Self-pay | Admitting: Interventional Cardiology

## 2012-12-10 ENCOUNTER — Encounter (HOSPITAL_COMMUNITY): Payer: BC Managed Care – PPO

## 2012-12-10 VITALS — BP 134/72 | HR 96 | Resp 18 | Ht 59.0 in | Wt 134.0 lb

## 2012-12-10 DIAGNOSIS — R911 Solitary pulmonary nodule: Secondary | ICD-10-CM

## 2012-12-10 DIAGNOSIS — J984 Other disorders of lung: Secondary | ICD-10-CM

## 2012-12-10 NOTE — Telephone Encounter (Signed)
Message copied by Corky Crafts on Wed Dec 10, 2012  6:22 PM ------      Message from: Robyne Peers      Created: Tue Dec 09, 2012  3:28 PM      Regarding: Cardiac Rehab       Dear Dr. Eldridge Dace,            When can pt return to cardiac rehab following her recent DES placement?            Thank you,      Deveron Furlong, RN      Cardiac Pulmonary Rehab       ------

## 2012-12-10 NOTE — Telephone Encounter (Signed)
She can return on 10/13

## 2012-12-11 ENCOUNTER — Telehealth: Payer: Self-pay | Admitting: Interventional Cardiology

## 2012-12-11 NOTE — Telephone Encounter (Signed)
Per Dr. Eldridge Dace he doubts it is heart related if it is only with deep breathing and pt can try Tylenol. Pt notified and is agreeable. Also, notified pt if she becomes in distress to go to the ER.

## 2012-12-11 NOTE — Telephone Encounter (Signed)
New Problem  Pt states when she takes a deep breath on her left side under her breast she has a sharpe pain/// she believes it maybe gas but she is not sure/// please call.

## 2012-12-12 ENCOUNTER — Encounter (HOSPITAL_COMMUNITY): Payer: BC Managed Care – PPO

## 2012-12-14 ENCOUNTER — Encounter: Payer: Self-pay | Admitting: Surgery

## 2012-12-14 NOTE — Progress Notes (Signed)
[address]       Teresa [name] [address]             [phone]        HPI:  She returns today to discuss the results of her recent chest CT. When I saw her on 12/03/2012 she was complaining of exertional chest pain radiating down to her elbows associated with dyspnea. She underwent repeat cath on 12/08/2012 which showed that all of the previous stents were patent. She had a focal 80% lesion in the mid-RCA and as a wire was advanced by the lesion she developed a symptomatic subtotal occlusion requiring stenting with an excellent angiographic result and resolution of her symptoms. She has not returned to cardiac rehab yet.   Current Outpatient Prescriptions  Medication Sig Dispense Refill  . acetaminophen (TYLENOL) 500 MG tablet Take 1,000 mg by mouth 3 (three) times daily as needed for pain.       [name] albuterol (PROVENTIL HFA) 108 (90 BASE) MCG/ACT inhaler Inhale 2 puffs into the lungs 2 (two) times daily as needed for wheezing or shortness of breath.       [name] aspirin EC 81 MG tablet Take 81 mg by mouth daily.      . cholecalciferol (VITAMIN D) 1000 UNITS tablet Take 1,000 Units by mouth at bedtime.       . Coenzyme Q10 (CO Q 10) 100 MG CAPS Take 100 mg by mouth at bedtime.       [name] NASAL SALINE NA Place into the nose as directed.      . nitroGLYCERIN (NITROSTAT) 0.4 MG SL tablet Place 1 tablet (0.4 mg total) under the tongue every 5 (five) minutes as needed for chest pain.  25 tablet  4  . NON FORMULARY OTC Red oil      . OVER THE COUNTER MEDICATION Take 1 tablet by mouth daily with lunch. [facility] Menopause support      . pantoprazole (PROTONIX) 40 MG tablet Take 40 mg by mouth daily.      . ranitidine (ZANTAC) 150 MG tablet Take 150 mg by mouth as needed for heartburn.      . rosuvastatin (CRESTOR) 10 MG tablet Take 10 mg by mouth 3 (three) times a week. Monday, Wednesday, Friday      . Ticagrelor (BRILINTA) 90 MG TABS tablet Take 1 tablet (90 mg total) by mouth 2 (two) times  daily.  60 tablet  11   No current facility-administered medications for this visit.     Physical Exam: BP 134/72  Pulse 96  Resp 18  Ht 4\' 11"  (1.499 m)  Wt 134 lb (60.782 kg)  BMI 27.05 kg/m2  SpO2 97%  LMP 10/04/1999 She looks well  There is no cervical or supraclavicular adenopathy  Lungs are clear  Cardiac exam shows a regular rate and rhythm, 2/6 systolic murmur over aorta  No peripheral edema   Diagnostic Tests:  CLINICAL DATA: Right upper lobe nodule  EXAM:  CT CHEST WITHOUT CONTRAST  TECHNIQUE:  Multidetector CT imaging of the chest was performed following the  standard protocol without IV contrast.  COMPARISON: 08/2012  FINDINGS:  There is no axillary lymphadenopathy. No mediastinal or hilar  lymphadenopathy. Index precarinal lymph node which was 7 mm short  axis on the previous study is stable at 7 mm. Heart size is normal.  Coronary artery calcification is noted. No pericardial or pleural  effusion.  Lung windows show a 2.0 x 1.0  cm irregular ill-defined nodule in the  right apex with tethering to the lateral pleura. When I remeasure  the study from 08/19/2012, this lesion shows no substantial interval  change, measuring 1.9 x 1.0 cm at that time. This lesion was shown  to be hypermetabolic on a recent PET-CT. No new pulmonary lesions  are evident.  Bone windows reveal no worrisome lytic or sclerotic osseous lesions.  Images which include the upper abdomen show no evidence for an  adrenal nodule.  IMPRESSION:  No substantial interval change in the irregular right apical nodules  seen to be hypermetabolic on recent PET-CT. No other new or  progressive findings.  Electronically Signed  By: Kennith Center M.D.  On: 12/10/2012 10:53   Impression:  She has drug eluting stents in the LM and all three coronary arteries and will need dual anti-platelet therapy indefinately. Since her CT scan shows no change in the right apical lung lesion we will wait until  late December to do surgery. She would like to get it done before the end of the year. She will require bridging in the hospital on Integrillin preop.   Plan:  I will see her back in early December to see how she is doing and make surgical plans.

## 2012-12-15 ENCOUNTER — Encounter (HOSPITAL_COMMUNITY)
Admission: RE | Admit: 2012-12-15 | Discharge: 2012-12-15 | Disposition: A | Discharge status: Home / Self Care | Payer: BC Managed Care – PPO | Source: Ambulatory Visit | Attending: Interventional Cardiology | Admitting: Interventional Cardiology

## 2012-12-15 NOTE — Progress Notes (Signed)
Pt returned to cardiac rehab today following DES placement.  Pt denies change to medication regimen.  Pt states she feels well without chest symptoms.  Pt does however c/o frequent dull headaches.   Pt able to participate in light activity without difficulty

## 2012-12-17 ENCOUNTER — Encounter (HOSPITAL_COMMUNITY)
Admission: RE | Admit: 2012-12-17 | Discharge: 2012-12-17 | Disposition: A | Discharge status: Home / Self Care | Payer: BC Managed Care – PPO | Source: Ambulatory Visit | Attending: Interventional Cardiology | Admitting: Interventional Cardiology

## 2012-12-17 NOTE — Progress Notes (Signed)
Pt c/o bilateral shoulder discomfort while walking on treadmill today.  Pt describes as "burning"    Pt immediately reports relief of discomfort with cessation of activity.  Pt does report the speed and incline are difficult for her.  Pt has no symptoms with other activities and denies pain at home with activities.  Pt has scheduled appt 12/26/12 with Dr Eldridge Dace.  Will decrease workload for next exercise session. Dr. Eldridge Dace made aware.  Pt instructed to contact Dr. Eldridge Dace if symptoms return or worsen at home.  Pt also instructed in proper use of NTG and when to call 911.  Understanding verbalized

## 2012-12-19 ENCOUNTER — Encounter (HOSPITAL_COMMUNITY)
Admission: RE | Admit: 2012-12-19 | Discharge: 2012-12-19 | Disposition: A | Discharge status: Home / Self Care | Payer: BC Managed Care – PPO | Source: Ambulatory Visit | Attending: Interventional Cardiology | Admitting: Interventional Cardiology

## 2012-12-22 ENCOUNTER — Encounter (HOSPITAL_COMMUNITY)
Admission: RE | Admit: 2012-12-22 | Discharge: 2012-12-22 | Disposition: A | Discharge status: Home / Self Care | Payer: BC Managed Care – PPO | Source: Ambulatory Visit | Attending: Interventional Cardiology | Admitting: Interventional Cardiology

## 2012-12-22 ENCOUNTER — Telehealth: Payer: Self-pay | Admitting: Interventional Cardiology

## 2012-12-22 ENCOUNTER — Telehealth: Payer: Self-pay | Admitting: Cardiology

## 2012-12-22 MED ORDER — CLOPIDOGREL BISULFATE 75 MG PO TABS: 75.0000 mg | ORAL_TABLET | Freq: Every day | ORAL | Status: DC

## 2012-12-22 MED ORDER — LANSOPRAZOLE 30 MG PO CPDR: 30.0000 mg | DELAYED_RELEASE_CAPSULE | Freq: Every day | ORAL | Status: DC

## 2012-12-22 NOTE — Addendum Note (Signed)
Addended byOrlene Plum H on: 12/22/2012 05:03 PM   Modules accepted: Orders, Medications

## 2012-12-22 NOTE — Telephone Encounter (Signed)
lmtrc

## 2012-12-22 NOTE — Telephone Encounter (Signed)
Per Dr. Eldridge Dace pt can switch back to prevacid. Also once pt finishes supply of Brilinta pt can start Plavix 75mg  1 tab po daily.

## 2012-12-22 NOTE — Telephone Encounter (Signed)
Pt will stop protonix and go back on prevacid. Pt already has rx that Dr. Earl Gala sends in. Also, pt will finish out current supply of Brilinta and then she will go back on plavix. Pt still has a prescription for plavix at home.

## 2012-12-22 NOTE — Telephone Encounter (Signed)
From Guadalupe Maple   To Everette Rank, MD [[id number]]   Composed 12/20/2012 8:36 PM   For Delivery On 12/20/2012 8:36 PM   Subject Questionnaire Submission   Message Type General Questionnaire Submission   Read Status Y   Message Body Patient Questionnaire Submission  --------------------------------   Questionnaire: Questionnaire   Question: How are you feeling after your recent visit?  Answer: Like the Fat Fairy Visits me in my sleep every night Heavier by the day!   Question: Does the recommended course of treatment seem to be helping your symptoms?  Answer: No,I think I need to start back on my Prevacid.I am refluxing also.   Question: Are you experiencing any side effects from your recommended treatment?  Answer: Yes I think I am!headaches! dizziness! shortness of breath,I looked on my rx,these are side effects of my Brilinta.   Question: is there anything else you would like to ask your physician?  Answer: If I can change back to my Prevacid, do I just tolerate the side effects of the Brilinta for the six months or so?   ----- Message -----  From: Corky Crafts., MD  Sent: 12/05/2012 11:46 AM  To: Guadalupe Maple  Subject: Questionnaire   To ensure we are providing you the highest quality healthcare, we'd like to know how you are feeling after your recent visit. At your earliest convenience, please complete the brief follow-up assessment by clicking the Task: Questionnaire link listed above.   Thank you for your time in helping [place] improve our services and for partnering with [place] in your wellness and care.   Sincerely,   Your Care Team

## 2012-12-22 NOTE — Addendum Note (Signed)
Addended byOrlene Plum H on: 12/22/2012 10:54 AM   Modules accepted: Orders, Medications

## 2012-12-22 NOTE — Telephone Encounter (Signed)
To Dr. Eldridge Dace, please review and send back to me.

## 2012-12-24 ENCOUNTER — Encounter (HOSPITAL_COMMUNITY)
Admission: RE | Admit: 2012-12-24 | Discharge: 2012-12-24 | Disposition: A | Discharge status: Home / Self Care | Payer: BC Managed Care – PPO | Source: Ambulatory Visit | Attending: Interventional Cardiology | Admitting: Interventional Cardiology

## 2012-12-24 ENCOUNTER — Encounter (HOSPITAL_COMMUNITY): Payer: Self-pay

## 2012-12-24 NOTE — Progress Notes (Signed)
Teresa [name] 62 y.o. female Nutrition Note Spoke with pt.  Nutrition Plan and Nutrition Survey goals reviewed with pt. Pt is following Step 2 of the Therapeutic Lifestyle Changes diet. Pt wanted to lose wt and reports her wt is up. Wt today 61.6 kg, which is up 3.3 kg. Wt gain likely due in part to tobacco cessation. Pt believes she is ready to start a wt loss regimen. Wt loss tips reviewed. Pt expressed understanding of the information reviewed. Pt aware of nutrition education classes offered and reports she has attended both Tuesday nutrition classes.  Nutrition Diagnosis   Food-and nutrition-related knowledge deficit related to lack of exposure to information as related to diagnosis of: ? CVD    Overweight related to excessive energy intake as evidenced by a BMI of 25.5    Nutrition RX/ Estimated Daily Nutrition Needs for: wt loss  1200-1300 Kcal, 30-35 gm fat, 8-9 gm sat fat, 1.1-1.2 gm trans-fat, <1500 mg sodium   Nutrition Intervention   Pt's individual nutrition plan including cholesterol goals reviewed with pt.   Benefits of adopting Therapeutic Lifestyle Changes discussed when Medficts reviewed.   Pt to attend the Portion Distortion class   Pt to attend the  ? Nutrition I class - met per pt                    ? Nutrition II class - met per pt   Per pt request handouts given for: ? Nutrition I class ? Nutrition II class    Continue client-centered nutrition education by RD, as part of interdisciplinary care.  Goal(s)   Pt to identify food quantities necessary to achieve: ? wt loss to a goal wt of 116-122 lb (52.7-55.6 kg) at graduation from cardiac rehab.    Pt to describe the benefit of including fruits, vegetables, whole grains, and low-fat dairy products in a heart healthy meal plan.  Monitor and Evaluate progress toward nutrition goal with team. Nutrition Risk:  Low   Mickle Plumb, M.Ed, RD, LDN, CDE 12/24/2012 2:19 PM

## 2012-12-24 NOTE — Progress Notes (Signed)
Pt graduated from cardiac rehab program today, unfortunately pt insurance only covers 18 sessions.    Medication list reconciled.  PHQ9 score-O.    Pt has made significant lifestyle changes and should be commended for her success. Pt plans to continue exercising on her own.

## 2012-12-26 ENCOUNTER — Encounter: Payer: Self-pay | Admitting: Interventional Cardiology

## 2012-12-26 ENCOUNTER — Ambulatory Visit (INDEPENDENT_AMBULATORY_CARE_PROVIDER_SITE_OTHER): Payer: BC Managed Care – PPO | Admitting: Interventional Cardiology

## 2012-12-26 ENCOUNTER — Encounter (HOSPITAL_COMMUNITY): Payer: BC Managed Care – PPO

## 2012-12-26 VITALS — BP 104/74 | HR 98 | Ht 59.5 in | Wt 134.0 lb

## 2012-12-26 DIAGNOSIS — R911 Solitary pulmonary nodule: Secondary | ICD-10-CM

## 2012-12-26 DIAGNOSIS — I2510 Atherosclerotic heart disease of native coronary artery without angina pectoris: Secondary | ICD-10-CM | POA: Insufficient documentation

## 2012-12-26 DIAGNOSIS — E782 Mixed hyperlipidemia: Secondary | ICD-10-CM | POA: Insufficient documentation

## 2012-12-26 DIAGNOSIS — Z01810 Encounter for preprocedural cardiovascular examination: Secondary | ICD-10-CM

## 2012-12-26 MED ORDER — CLOPIDOGREL BISULFATE 75 MG PO TABS: 75.0000 mg | ORAL_TABLET | Freq: Every day | ORAL | Status: DC

## 2012-12-26 NOTE — Patient Instructions (Addendum)
Your physician wants you to follow-up in: 3 months with Dr. Eldridge Dace. You will receive a reminder letter in the mail two months in advance. If you don't receive a letter, please call our office to schedule the follow-up appointment.  Your physician recommends that you continue on your current medications as directed. Please refer to the Current Medication list given to you today.  You will go to [facility] next week to have P2y12 Platelet lab drawn.  Refilled Plavix to [facility].

## 2012-12-26 NOTE — Progress Notes (Signed)
Patient ID: Teresa [name], female   DOB: 1950-05-11, 62 y.o.   MRN: [id number]    [address] [place], [place]  [address] Phone: [phone] Fax:  [phone]  Date:  12/26/2012   ID:  Teresa [name], Teresa [name] [birth year], MRN [id number]  PCP:  Darnelle Bos, MD      History of Present Illness: Teresa [name] is a 62 y.o. female who has had a complex left main , circumflex , LAD stenting after a dissection during PCI.  She had PCI of the RCA complicated by dissection and required 3 stents. CAD/ASCVD:  Denies : Chest pain.  Dizziness.  Dyspnea on exertion.  Fatigue.  Nitroglycerin.  Orthopnea.  Palpitations.     Wt Readings from Last 3 Encounters:  12/26/12 134 lb (60.782 kg)  12/10/12 134 lb (60.782 kg)  12/09/12 134 lb 7.7 oz (61 kg)     Past Medical History  Diagnosis Date  . GERD (gastroesophageal reflux disease)   . Hypercholesterolemia   . Seasonal allergies   . Tobacco abuse   . Cataract     "? right" (06/24/2012)  . Aortic valve stenosis, moderate   . Heart murmur   . Mass of lung     "right spot; they are watching it" (06/24/2012)  . Cellulitis 06/20/2012    "dog bite; right forearm" (06/24/2012)  . Arthritis     "across my hips; comes w/the weather" (06/24/2012)  . Nonspecific abnormal unspecified cardiovascular function study   . Peripheral vascular disease, unspecified   . CAD (coronary artery disease), native coronary artery   . Other and unspecified angina pectoris     Current Outpatient Prescriptions  Medication Sig Dispense Refill  . acetaminophen (TYLENOL) 500 MG tablet Take 1,000 mg by mouth 3 (three) times daily as needed for pain.       [name] albuterol (PROVENTIL HFA) 108 (90 BASE) MCG/ACT inhaler Inhale 2 puffs into the lungs 2 (two) times daily as needed for wheezing or shortness of breath.       [name] aspirin EC 81 MG tablet Take 81 mg by mouth daily.      . cholecalciferol (VITAMIN D) 1000 UNITS tablet Take 1,000 Units by  mouth at bedtime.       . clopidogrel (PLAVIX) 75 MG tablet Take 1 tablet (75 mg total) by mouth daily.  90 tablet  3  . Coenzyme Q10 (CO Q 10) 100 MG CAPS Take 100 mg by mouth at bedtime.       . lansoprazole (PREVACID) 30 MG capsule Take 1 capsule (30 mg total) by mouth daily.      [name] NASAL SALINE NA Place into the nose as directed.      . nitroGLYCERIN (NITROSTAT) 0.4 MG SL tablet Place 1 tablet (0.4 mg total) under the tongue every 5 (five) minutes as needed for chest pain.  25 tablet  4  . NON FORMULARY OTC Red oil      . OVER THE COUNTER MEDICATION Take 1 tablet by mouth daily with lunch. [facility] Menopause support      . ranitidine (ZANTAC) 150 MG tablet Take 150 mg by mouth as needed for heartburn.      . rosuvastatin (CRESTOR) 10 MG tablet Take 10 mg by mouth 3 (three) times a week. Monday, Wednesday, Friday       No current facility-administered medications for this visit.    Allergies:    Allergies  Allergen Reactions  . Azithromycin Shortness  Of Breath  . Ceclor [Cefaclor] Shortness Of Breath  . Morphine And Related     Terrible headache  . Penicillins Other (See Comments)    Unknown from childhood  . Vicodin [Hydrocodone-Acetaminophen] Nausea And Vomiting  . Septra [Sulfamethoxazole-Trimethoprim] Rash  . Adhesive [Tape] Other (See Comments)    Redness and swelling - use paper tape  . Doxycycline Other (See Comments)    Redness on the face    Social History:  The patient  reports that she quit smoking about 6 months ago. Her smoking use included Cigarettes. She has a 20 pack-year smoking history. She has never used smokeless tobacco. She reports that she does not drink alcohol or use illicit drugs.   Family History:  The patient's family history includes Alcohol abuse in her father.   ROS:  Please see the history of present illness.  No nausea, vomiting.  No fevers, chills.  No focal weakness.  No dysuria. Right leg pain   All other systems reviewed and negative.   PHYSICAL  EXAM: VS:  BP 104/74  Pulse 98  Ht 4' 11.5" (1.511 m)  Wt 134 lb (60.782 kg)  BMI 26.62 kg/m2  LMP 10/04/1999 Well nourished, well developed, in no acute distress HEENT: normal Neck: no JVD, no carotid bruits Cardiac:  normal S1, S2; RRR; 2/6 systolic murmur Lungs:  clear to auscultation bilaterally, no wheezing, rhonchi or rales Abd: soft, nontender, no hepatomegaly Ext: no edema Skin: warm and dry Neuro:   no focal abnormalities noted     ASSESSMENT AND PLAN:  1. CAD: P2Y12 testing due to switching back to Plavix.   We'll plan for this next week. No angina since RCA stents. Left-sided stents were widely patent. Her lung cancer surgery will be at the end of December. She will be admitted, placed on IV Integrilin and then this will be stopped the morning of the surgery. We'll restart anticoagulation the next day. 2. Leg pain:  She feels that it is her sciatica which has acted up since she has not been taking her nonsteroidal anti-inflammatory medicines. I told her she did take ibuprofen 400 mg by mouth twice a day for the next 3 days to see if this content or leg pain. 3. Hyperlipidemia: Last LDL was 136 while she is on Crestor 10 mg 3 times a week. She will likely need some additional lipid lowering therapy to get to an LDL of at least below 100. 4. Would not plan for any other preoperative cardiac testing before removal of her lung cancer.  Signed, Fredric Mare, MD, [facility] 12/26/2012 3:18 PM

## 2012-12-29 ENCOUNTER — Encounter (HOSPITAL_COMMUNITY): Payer: BC Managed Care – PPO

## 2012-12-29 ENCOUNTER — Telehealth: Payer: Self-pay | Admitting: *Deleted

## 2012-12-29 NOTE — Telephone Encounter (Signed)
I called patient to check in.  She reports that she is doing well.  She continues with leg pain but has recently begun to take ibuprofen and is experiencing improvement in her symptoms.  She does report some swelling in her lower extremities which was evaluated by Dr. Eldridge Dace at her most recent appointment.  She reports that circulation to her lower extremities was good at that time.  She denies any shortness of breath except occasionally with exertion which resolves quickly.  Patient encouraged to contact her PCP if symptoms continue or worsen.  Patient had questions about upcoming surgery for her lung mass which we discussed.  She is anxious to complete surgery and return to normal.  She continues to not smoke and is experiencing some weight gain.  Patient denied any other questions or concerns at this time.  We verified the date and time of her upcoming appointment with Dr. Laneta Simmers.  I encouraged her to call me for any needs.

## 2012-12-31 ENCOUNTER — Ambulatory Visit (HOSPITAL_COMMUNITY)
Admission: AD | Admit: 2012-12-31 | Discharge: 2012-12-31 | Disposition: A | Discharge status: Home / Self Care | Payer: BC Managed Care – PPO | Source: Ambulatory Visit | Attending: Interventional Cardiology | Admitting: Interventional Cardiology

## 2012-12-31 ENCOUNTER — Encounter (HOSPITAL_COMMUNITY): Payer: BC Managed Care – PPO

## 2012-12-31 LAB — PLATELET INHIBITION P2Y12: Platelet Function  P2Y12: 141 [PRU] — ABNORMAL LOW (ref 194–418)

## 2013-01-02 ENCOUNTER — Encounter (HOSPITAL_COMMUNITY): Payer: BC Managed Care – PPO

## 2013-01-02 ENCOUNTER — Other Ambulatory Visit: Payer: Self-pay | Admitting: Cardiology

## 2013-01-02 MED ORDER — CLOPIDOGREL BISULFATE 75 MG PO TABS: 75.0000 mg | ORAL_TABLET | Freq: Every day | ORAL | Status: DC

## 2013-01-02 NOTE — Telephone Encounter (Signed)
New problem    Patient Call [facility] , they are stating they never receive her medication - plavix.

## 2013-01-02 NOTE — Telephone Encounter (Signed)
Will refilled.

## 2013-01-05 ENCOUNTER — Encounter (HOSPITAL_COMMUNITY): Payer: BC Managed Care – PPO

## 2013-01-07 ENCOUNTER — Encounter (HOSPITAL_COMMUNITY): Payer: BC Managed Care – PPO

## 2013-01-09 ENCOUNTER — Encounter (HOSPITAL_COMMUNITY): Payer: BC Managed Care – PPO

## 2013-01-12 ENCOUNTER — Encounter (HOSPITAL_COMMUNITY): Payer: BC Managed Care – PPO

## 2013-01-14 ENCOUNTER — Encounter (HOSPITAL_COMMUNITY): Payer: BC Managed Care – PPO

## 2013-01-16 ENCOUNTER — Encounter (HOSPITAL_COMMUNITY): Payer: BC Managed Care – PPO

## 2013-01-19 ENCOUNTER — Encounter: Payer: Self-pay | Admitting: Interventional Cardiology

## 2013-01-19 ENCOUNTER — Encounter (HOSPITAL_COMMUNITY): Payer: BC Managed Care – PPO

## 2013-01-19 NOTE — Telephone Encounter (Deleted)
Error

## 2013-01-21 ENCOUNTER — Encounter (HOSPITAL_COMMUNITY): Payer: BC Managed Care – PPO

## 2013-01-23 ENCOUNTER — Telehealth: Payer: Self-pay | Admitting: Interventional Cardiology

## 2013-01-23 ENCOUNTER — Encounter (HOSPITAL_COMMUNITY): Payer: BC Managed Care – PPO

## 2013-01-23 MED ORDER — ISOSORBIDE MONONITRATE ER 30 MG PO TB24: 30.0000 mg | ORAL_TABLET | Freq: Every day | ORAL | Status: DC

## 2013-01-23 NOTE — Telephone Encounter (Signed)
Spoke with pt and ever since she seen [place] last she has had intermittent chest tightness with exertion. This time the pain is not in the middle of her chest like before the stent, but on both sides on the top both breasts. Pt will also have  pain in her upper arms as well. The pain will last about 1-2 minutes. This only happens if she does strenuous exercise. This happened at Cardiac Rehab yesterday as will.

## 2013-01-23 NOTE — Telephone Encounter (Signed)
New message  Patient is having a feeling like acid reflux, tightness. And she only wants to speak with Amy, please call and advise.

## 2013-01-23 NOTE — Telephone Encounter (Signed)
Per Dr. Eldridge Dace start pt on Imdur 30 mg 1 tablet po Daily and have pt call if symptoms persist.

## 2013-01-23 NOTE — Telephone Encounter (Signed)
Pt notified. Sent in Imdur 30 mg daily. Pt has appt with Dr. Eldridge Dace on 01/27/13.

## 2013-01-26 ENCOUNTER — Encounter (HOSPITAL_COMMUNITY): Payer: BC Managed Care – PPO

## 2013-01-27 ENCOUNTER — Ambulatory Visit (INDEPENDENT_AMBULATORY_CARE_PROVIDER_SITE_OTHER): Payer: BC Managed Care – PPO | Admitting: Interventional Cardiology

## 2013-01-27 ENCOUNTER — Encounter: Payer: Self-pay | Admitting: Interventional Cardiology

## 2013-01-27 ENCOUNTER — Ambulatory Visit: Payer: BC Managed Care – PPO | Admitting: Interventional Cardiology

## 2013-01-27 VITALS — BP 130/82 | HR 95 | Ht 59.5 in | Wt 139.0 lb

## 2013-01-27 DIAGNOSIS — R079 Chest pain, unspecified: Secondary | ICD-10-CM

## 2013-01-27 DIAGNOSIS — E782 Mixed hyperlipidemia: Secondary | ICD-10-CM

## 2013-01-27 DIAGNOSIS — I2510 Atherosclerotic heart disease of native coronary artery without angina pectoris: Secondary | ICD-10-CM

## 2013-01-27 NOTE — Progress Notes (Signed)
Patient ID: Teresa [name], female   DOB: 1950-12-04, 62 y.o.   MRN: [id number] Patient ID: Teresa [name], female   DOB: [birth year], 62 y.o.   MRN: [id number]    [address] [place], [place]  [address] Phone: [phone] Fax:  [phone]  Date:  01/27/2013   ID:  Teresa [name], Teresa [name] 25-May-1950, MRN [id number]  PCP:  Darnelle Bos, MD      History of Present Illness: Teresa [name] is a 62 y.o. female who has had a complex left main , circumflex , LAD stenting after a dissection during PCI.  She had PCI of the RCA complicated by dissection and required 3 stents.  she had one episode of chest discomfort when walking quickly and carrying a laptop and 1 hand and clothes in another. It was more exertion than she was used to. She felt a pulling in her chest that was in a different location from her prior angina was. She also felt it in both biceps areas. She has not had problems with regular walking. She has not had any excessive shortness of breath either.  CAD/ASCVD:  Denies :  Dizziness.  Dyspnea on exertion.  Fatigue.  Nitroglycerin.  Orthopnea.  Palpitations.     She has a headache since starting isosorbide.  Wt Readings from Last 3 Encounters:  01/27/13 139 lb (63.05 kg)  12/26/12 134 lb (60.782 kg)  12/10/12 134 lb (60.782 kg)     Past Medical History  Diagnosis Date  . GERD (gastroesophageal reflux disease)   . Hypercholesterolemia   . Seasonal allergies   . Tobacco abuse   . Cataract     "? right" (06/24/2012)  . Aortic valve stenosis, moderate   . Heart murmur   . Mass of lung     "right spot; they are watching it" (06/24/2012)  . Cellulitis 06/20/2012    "dog bite; right forearm" (06/24/2012)  . Arthritis     "across my hips; comes w/the weather" (06/24/2012)  . Nonspecific abnormal unspecified cardiovascular function study   . Peripheral vascular disease, unspecified   . CAD (coronary artery disease), native coronary artery   .  Other and unspecified angina pectoris     Current Outpatient Prescriptions  Medication Sig Dispense Refill  . acetaminophen (TYLENOL) 500 MG tablet Take 1,000 mg by mouth 3 (three) times daily as needed for pain.       [name] albuterol (PROVENTIL HFA) 108 (90 BASE) MCG/ACT inhaler Inhale 2 puffs into the lungs 2 (two) times daily as needed for wheezing or shortness of breath.       [name] aspirin EC 81 MG tablet Take 81 mg by mouth daily.      . cholecalciferol (VITAMIN D) 1000 UNITS tablet Take 1,000 Units by mouth at bedtime.       . clopidogrel (PLAVIX) 75 MG tablet Take 1 tablet (75 mg total) by mouth daily.  90 tablet  3  . Coenzyme Q10 (CO Q 10) 100 MG CAPS Take 100 mg by mouth at bedtime.       . isosorbide mononitrate (IMDUR) 30 MG 24 hr tablet Take 1 tablet (30 mg total) by mouth daily.  30 tablet  6  . lansoprazole (PREVACID) 30 MG capsule Take 1 capsule (30 mg total) by mouth daily.      [name] NASAL SALINE NA Place into the nose as directed.      . nitroGLYCERIN (NITROSTAT) 0.4 MG SL tablet Place 1 tablet (  0.4 mg total) under the tongue every 5 (five) minutes as needed for chest pain.  25 tablet  4  . NON FORMULARY OTC Red oil      . OVER THE COUNTER MEDICATION Take 1 tablet by mouth daily with lunch. [facility] Menopause support      . ranitidine (ZANTAC) 150 MG tablet Take 150 mg by mouth as needed for heartburn.      . rosuvastatin (CRESTOR) 10 MG tablet Take 10 mg by mouth 3 (three) times a week. Monday, Wednesday, Friday       No current facility-administered medications for this visit.    Allergies:    Allergies  Allergen Reactions  . Azithromycin Shortness Of Breath  . Ceclor [Cefaclor] Shortness Of Breath  . Morphine And Related     Terrible headache  . Penicillins Other (See Comments)    Unknown from childhood  . Vicodin [Hydrocodone-Acetaminophen] Nausea And Vomiting  . Septra [Sulfamethoxazole-Trimethoprim] Rash  . Adhesive [Tape] Other (See Comments)    Redness and swelling - use  paper tape  . Doxycycline Other (See Comments)    Redness on the face    Social History:  The patient  reports that she quit smoking about 7 months ago. Her smoking use included Cigarettes. She has a 20 pack-year smoking history. She has never used smokeless tobacco. She reports that she does not drink alcohol or use illicit drugs.   Family History:  The patient's family history includes Alcohol abuse in her father.   ROS:  Please see the history of present illness.  No nausea, vomiting.  No fevers, chills.  No focal weakness.  No dysuria. Right leg pain   All other systems reviewed and negative.   PHYSICAL EXAM: VS:  BP 130/82  Pulse 95  Ht 4' 11.5" (1.511 m)  Wt 139 lb (63.05 kg)  BMI 27.62 kg/m2  LMP 10/04/1999 Well nourished, well developed, in no acute distress HEENT: normal Neck: no JVD, no carotid bruits Cardiac:  normal S1, S2; RRR; 2/6 systolic murmur Lungs:  clear to auscultation bilaterally, no wheezing, rhonchi or rales Abd: soft, nontender, no hepatomegaly Ext: no edema Skin: warm and dry Neuro:   no focal abnormalities noted     ASSESSMENT AND PLAN:  1. CAD: P2Y12 testing showed  Plavix was adequate antiplatelet therapy.    One episode of chest discomfort since RCA stents. Left-sided stents were widely patent. Her lung cancer surgery will be at the end of December. She will be admitted, placed on IV Integrilin and then this will be stopped the morning of the surgery. We'll restart anticoagulation the next day.  I reviewed her cath films. He had a moderate lesion in the proximal right coronary artery but it did not appear to be hemodynamically significant. I think since her shortness of breath continues to remain better and she walks at a normal pace without problems, I would not perform a repeat cath at this time. 2. Hyperlipidemia: Last LDL was 136 while she is on Crestor 10 mg 3 times a week. She will likely need some additional lipid lowering therapy to get to an LDL  of at least below 100. 3. Would not plan for any other preoperative cardiac testing before removal of her lung cancer. 4. Aortic stenosis: Mild to moderate in the past.  Signed, Fredric Mare, MD, [facility] 01/27/2013 2:21 PM

## 2013-01-27 NOTE — Patient Instructions (Signed)
You will receive a letter in the mail in a couple of months that will tell you to call and make appointment.  Stay on Imdur, but if headache persists then you can stop medication.

## 2013-01-27 NOTE — Progress Notes (Signed)
Patient ID: Teresa [name], female   DOB: 30-Jul-1950, 62 y.o.   MRN: [id number]   ECG showed NSR with nonspecific ST segment changes on 11/25  [name]

## 2013-01-28 ENCOUNTER — Encounter (HOSPITAL_COMMUNITY): Payer: BC Managed Care – PPO

## 2013-01-30 ENCOUNTER — Encounter (HOSPITAL_COMMUNITY): Payer: BC Managed Care – PPO

## 2013-02-02 ENCOUNTER — Other Ambulatory Visit: Payer: Self-pay | Admitting: *Deleted

## 2013-02-02 ENCOUNTER — Encounter (HOSPITAL_COMMUNITY): Payer: BC Managed Care – PPO

## 2013-02-02 DIAGNOSIS — R911 Solitary pulmonary nodule: Secondary | ICD-10-CM

## 2013-02-04 ENCOUNTER — Ambulatory Visit
Admission: RE | Admit: 2013-02-04 | Discharge: 2013-02-04 | Disposition: A | Discharge status: Home / Self Care | Payer: BC Managed Care – PPO | Source: Ambulatory Visit | Attending: Surgery | Admitting: Surgery

## 2013-02-04 ENCOUNTER — Ambulatory Visit (INDEPENDENT_AMBULATORY_CARE_PROVIDER_SITE_OTHER): Payer: BC Managed Care – PPO | Admitting: Surgery

## 2013-02-04 ENCOUNTER — Encounter: Payer: Self-pay | Admitting: *Deleted

## 2013-02-04 ENCOUNTER — Encounter: Payer: Self-pay | Admitting: Surgery

## 2013-02-04 ENCOUNTER — Encounter (HOSPITAL_COMMUNITY): Payer: BC Managed Care – PPO

## 2013-02-04 VITALS — BP 148/80 | HR 96 | Resp 20 | Ht 59.0 in | Wt 139.0 lb

## 2013-02-04 DIAGNOSIS — J984 Other disorders of lung: Secondary | ICD-10-CM

## 2013-02-04 DIAGNOSIS — R911 Solitary pulmonary nodule: Secondary | ICD-10-CM

## 2013-02-04 NOTE — Progress Notes (Signed)
Patient seen at [facility] to discuss surgery with Dr. Laneta Simmers.  Patient reports she is doing well today.  She is accompanied by her son.  They did discuss her recent episode of chest pain and tightness for which she saw her cardiologist.  They expressed some concern about whether her cardiac status was stable enough for surgery.  I reassured them that Dr. Laneta Simmers would evaluate her status and would proceed with surgery only if he felt comfortable that she is stable.  I encouraged them to ask questions until they are comfortable that they understand and are satisfied with the information they have.  Patient's son shared that his mother was not worried about the surgery but about what kind of pain she will experience following surgery.  We discussed that she would have pain medication available and that the goal is to keep her comfortable so that she can breathe deeply and move around to aid in her healing.  We also discussed that she needed to communicate with her nurse and physicians if her medication is not relieving her pain so that they can be adjusted.  Patient and son denied any other questions or concerns.  I encouraged them to call me for any questions or needs.  They verbalized understanding.

## 2013-02-04 NOTE — Progress Notes (Signed)
HPI:  She returns today for follow up before scheduling right upper lobectomy for a hypermetabolic lung lesion. She had a complicated LAD PCI with dissection extending back into the left main requiring stenting of the LM, LAD and LCX. She was treated with DAPT and on further followup reported recurrent chest pain. She had PCI of a RCA lesion that was complicated by dissection requiring 3 stents on 12/08/2012. She has remained on aspirin and plavix. She reports one episode of chest discomfort recently while walking while carrying a laptop in one hand and clothes in another. She said it felt just like the pain she had before her PCI across the upper chest and into both upper arms. She was seen by Dr. Eldridge Dace and started on Imdur but developed a severe headache and stopped it on Thanksgiving day. She has had no recurrent chest pain with normal activity. She says she feels great since stopping the Imdur. She has had no cough or shortness of breath.  Current Outpatient Prescriptions  Medication Sig Dispense Refill  . acetaminophen (TYLENOL) 500 MG tablet Take 1,000 mg by mouth 3 (three) times daily as needed for pain.       [name] albuterol (PROVENTIL HFA) 108 (90 BASE) MCG/ACT inhaler Inhale 2 puffs into the lungs 2 (two) times daily as needed for wheezing or shortness of breath.       [name] aspirin EC 81 MG tablet Take 81 mg by mouth daily.      . cholecalciferol (VITAMIN D) 1000 UNITS tablet Take 1,000 Units by mouth at bedtime.       . clopidogrel (PLAVIX) 75 MG tablet Take 1 tablet (75 mg total) by mouth daily.  90 tablet  3  . Coenzyme Q10 (CO Q 10) 100 MG CAPS Take 100 mg by mouth at bedtime.       . lansoprazole (PREVACID) 30 MG capsule Take 1 capsule (30 mg total) by mouth daily.      [name] NASAL SALINE NA Place into the nose as directed.      . nitroGLYCERIN (NITROSTAT) 0.4 MG SL tablet Place 1 tablet (0.4 mg total) under the tongue every 5 (five) minutes as needed for chest pain.  25 tablet  4  .  OVER THE COUNTER MEDICATION Take 1 tablet by mouth daily with lunch. [facility] Menopause support      . ranitidine (ZANTAC) 150 MG tablet Take 150 mg by mouth as needed for heartburn.      . rosuvastatin (CRESTOR) 10 MG tablet Take 10 mg by mouth 3 (three) times a week. Monday, Wednesday, Friday       No current facility-administered medications for this visit.     Physical Exam: BP 148/80  Pulse 96  Resp 20  Ht 4\' 11"  (1.499 m)  Wt 139 lb (63.05 kg)  BMI 28.06 kg/m2  SpO2 97%  LMP 10/04/1999 She looks well. Lung exam is clear. Cardiac exam shows a regular rate and rhythm with normal heart sounds.   Diagnostic Tests:  CLINICAL DATA: Followup right upper lobe lung lesion  EXAM:  CHEST 2 VIEW  COMPARISON: CT chest of 12/10/2012 and chest x-ray of 12/03/2012  FINDINGS:  The CT demonstrated nodule in the right upper lobe is not as well  seen, with vague opacity remaining at that site by chest x-ray. No  obvious enlargement of the right upper lobe lung nodule is evident.  No new lung nodule is seen. No pleural effusion is noted.  Mediastinal  contours appear stable, and the heart remains mildly  enlarged. No bony abnormality is seen.  IMPRESSION:  The right upper lobe lung nodule is not as well seen by chest x-ray  but no enlargement of the nodule is evident by chest x-ray. No  active lung disease.  Electronically Signed  By: Dwyane Dee M.D.  On: 02/04/2013 10:45   Impression:  She is doing fairly well overall but did have an episode of chest pain a few weeks ago. She has seen Dr. Eldridge Dace who feels she is stable to undergo lung resection without further workup. I told her I would be ok with that as long as she has no further chest pain with normal activity. I reviewed the surgery again with the patient and her son including alternatives, benefits and risks including but not limited to bleeding, blood transfusion, infection, prolonged air leak, pleural space problems, respiratory  failure, chronic dyspnea, cardiac complications, and recurrent cancer. I also discussed the possibility that this is not a cancer, although I think that is unlikely given its appearance and hypermetabolic activity on PET scan. She would like to proceed with surgery on 03/02/2013   Plan:  She will stop taking her plavix after her dose on 02/24/2013. She will be admitted on 02/27/2013 for Integrillin bridge for planned right upper lobectomy on 03/02/2013.

## 2013-02-06 ENCOUNTER — Encounter (HOSPITAL_COMMUNITY): Payer: BC Managed Care – PPO

## 2013-02-09 ENCOUNTER — Encounter (HOSPITAL_COMMUNITY): Payer: BC Managed Care – PPO

## 2013-02-16 ENCOUNTER — Encounter (HOSPITAL_COMMUNITY): Payer: Self-pay | Admitting: Respiratory Therapy

## 2013-02-27 ENCOUNTER — Encounter (HOSPITAL_COMMUNITY): Payer: Self-pay | Admitting: General Practice

## 2013-02-27 ENCOUNTER — Inpatient Hospital Stay (HOSPITAL_COMMUNITY)
Admission: RE | Admit: 2013-02-27 | Discharge: 2013-03-06 | DRG: 164 | Disposition: A | Discharge status: Home / Self Care | Payer: BC Managed Care – PPO | Source: Ambulatory Visit | Attending: Surgery | Admitting: Surgery

## 2013-02-27 DIAGNOSIS — D62 Acute posthemorrhagic anemia: Secondary | ICD-10-CM | POA: Diagnosis not present

## 2013-02-27 DIAGNOSIS — Z7982 Long term (current) use of aspirin: Secondary | ICD-10-CM

## 2013-02-27 DIAGNOSIS — I739 Peripheral vascular disease, unspecified: Secondary | ICD-10-CM | POA: Diagnosis present

## 2013-02-27 DIAGNOSIS — Z881 Allergy status to other antibiotic agents status: Secondary | ICD-10-CM

## 2013-02-27 DIAGNOSIS — K219 Gastro-esophageal reflux disease without esophagitis: Secondary | ICD-10-CM | POA: Diagnosis present

## 2013-02-27 DIAGNOSIS — J449 Chronic obstructive pulmonary disease, unspecified: Secondary | ICD-10-CM | POA: Diagnosis present

## 2013-02-27 DIAGNOSIS — J4489 Other specified chronic obstructive pulmonary disease: Secondary | ICD-10-CM | POA: Diagnosis present

## 2013-02-27 DIAGNOSIS — Z87891 Personal history of nicotine dependence: Secondary | ICD-10-CM

## 2013-02-27 DIAGNOSIS — M129 Arthropathy, unspecified: Secondary | ICD-10-CM | POA: Diagnosis present

## 2013-02-27 DIAGNOSIS — I359 Nonrheumatic aortic valve disorder, unspecified: Secondary | ICD-10-CM | POA: Diagnosis present

## 2013-02-27 DIAGNOSIS — I2510 Atherosclerotic heart disease of native coronary artery without angina pectoris: Secondary | ICD-10-CM | POA: Diagnosis present

## 2013-02-27 DIAGNOSIS — Z7902 Long term (current) use of antithrombotics/antiplatelets: Secondary | ICD-10-CM

## 2013-02-27 DIAGNOSIS — Z882 Allergy status to sulfonamides status: Secondary | ICD-10-CM

## 2013-02-27 DIAGNOSIS — Z880 Allergy status to penicillin: Secondary | ICD-10-CM

## 2013-02-27 DIAGNOSIS — E7800 Pure hypercholesterolemia, unspecified: Secondary | ICD-10-CM | POA: Diagnosis present

## 2013-02-27 DIAGNOSIS — E782 Mixed hyperlipidemia: Secondary | ICD-10-CM | POA: Diagnosis present

## 2013-02-27 DIAGNOSIS — C3410 Malignant neoplasm of upper lobe, unspecified bronchus or lung: Principal | ICD-10-CM | POA: Diagnosis present

## 2013-02-27 DIAGNOSIS — R000 Tachycardia, unspecified: Secondary | ICD-10-CM

## 2013-02-27 DIAGNOSIS — Z888 Allergy status to other drugs, medicaments and biological substances status: Secondary | ICD-10-CM

## 2013-02-27 DIAGNOSIS — Z79899 Other long term (current) drug therapy: Secondary | ICD-10-CM

## 2013-02-27 DIAGNOSIS — R918 Other nonspecific abnormal finding of lung field: Secondary | ICD-10-CM | POA: Diagnosis present

## 2013-02-27 DIAGNOSIS — Z885 Allergy status to narcotic agent status: Secondary | ICD-10-CM

## 2013-02-27 DIAGNOSIS — Z9861 Coronary angioplasty status: Secondary | ICD-10-CM

## 2013-02-27 HISTORY — DX: Acute myocardial infarction, unspecified: I21.9

## 2013-02-27 HISTORY — DX: Unspecified chronic bronchitis: J42

## 2013-02-27 HISTORY — DX: Tachycardia, unspecified: R00.0

## 2013-02-27 HISTORY — DX: Pneumonia, unspecified organism: J18.9

## 2013-02-27 HISTORY — DX: Chronic obstructive pulmonary disease, unspecified: J44.9

## 2013-02-27 LAB — COMPREHENSIVE METABOLIC PANEL
ALT: 16 U/L (ref 0–35)
AST: 23 U/L (ref 0–37)
Albumin: 4 g/dL (ref 3.5–5.2)
Alkaline Phosphatase: 77 U/L (ref 39–117)
CO2: 23 mEq/L (ref 19–32)
Chloride: 100 mEq/L (ref 96–112)
Creatinine, Ser: 0.6 mg/dL (ref 0.50–1.10)
GFR calc non Af Amer: >90 mL/min (ref >90)
Potassium: 4.3 mEq/L (ref 3.5–5.1)
Total Bilirubin: 0.3 mg/dL (ref 0.3–1.2)

## 2013-02-27 LAB — URINALYSIS W MICROSCOPIC + REFLEX CULTURE
Bilirubin Urine: NEGATIVE
Glucose, UA: NEGATIVE mg/dL
Hgb urine dipstick: NEGATIVE
Ketones, ur: NEGATIVE mg/dL
Leukocytes, UA: NEGATIVE
Nitrite: NEGATIVE
Protein, ur: NEGATIVE mg/dL
pH: 6.5 (ref 5.0–8.0)

## 2013-02-27 LAB — CBC
MCH: 29.5 pg (ref 26.0–34.0)
MCHC: 33.1 g/dL (ref 30.0–36.0)
MCV: 89.1 fL (ref 78.0–100.0)
Platelets: 264 10*3/uL (ref 150–400)
RBC: 4.21 MIL/uL (ref 3.87–5.11)
RDW: 13.1 % (ref 11.5–15.5)

## 2013-02-27 LAB — APTT: aPTT: 27 seconds (ref 24–37)

## 2013-02-27 LAB — PROTIME-INR: Prothrombin Time: 12.2 seconds (ref 11.6–15.2)

## 2013-02-27 MED ORDER — ALUM & MAG HYDROXIDE-SIMETH 200-200-20 MG/5ML PO SUSP: 30.0000 mL | Freq: Four times a day (QID) | ORAL | Status: DC | PRN

## 2013-02-27 MED ORDER — EPTIFIBATIDE BOLUS VIA INFUSION
180.0000 ug/kg | Freq: Once | INTRAVENOUS | Status: AC
  Administered 2013-02-27: 11300 ug via INTRAVENOUS
  Filled 2013-02-27 (×2): qty 16

## 2013-02-27 MED ORDER — ONDANSETRON HCL 4 MG/2ML IJ SOLN: 4.0000 mg | Freq: Four times a day (QID) | INTRAMUSCULAR | Status: DC | PRN

## 2013-02-27 MED ORDER — ATORVASTATIN CALCIUM 20 MG PO TABS
20.0000 mg | ORAL_TABLET | ORAL | Status: DC
  Administered 2013-02-27 – 2013-03-04 (×2): 20 mg via ORAL
  Filled 2013-02-27 (×6): qty 1

## 2013-02-27 MED ORDER — ONDANSETRON HCL 4 MG PO TABS: 4.0000 mg | ORAL_TABLET | Freq: Four times a day (QID) | ORAL | Status: DC | PRN

## 2013-02-27 MED ORDER — ACETAMINOPHEN 325 MG PO TABS: 650.0000 mg | ORAL_TABLET | Freq: Four times a day (QID) | ORAL | Status: DC | PRN

## 2013-02-27 MED ORDER — SODIUM CHLORIDE 0.9 % IJ SOLN
3.0000 mL | Freq: Two times a day (BID) | INTRAMUSCULAR | Status: DC
  Administered 2013-02-28 – 2013-03-01 (×2): 3 mL via INTRAVENOUS

## 2013-02-27 MED ORDER — ACETAMINOPHEN 650 MG RE SUPP: 650.0000 mg | Freq: Four times a day (QID) | RECTAL | Status: DC | PRN

## 2013-02-27 MED ORDER — ZOLPIDEM TARTRATE 5 MG PO TABS: 5.0000 mg | ORAL_TABLET | Freq: Every evening | ORAL | Status: DC | PRN

## 2013-02-27 MED ORDER — DOCUSATE SODIUM 100 MG PO CAPS
100.0000 mg | ORAL_CAPSULE | Freq: Two times a day (BID) | ORAL | Status: DC
  Administered 2013-02-28 – 2013-03-01 (×4): 100 mg via ORAL
  Filled 2013-02-27 (×9): qty 1

## 2013-02-27 MED ORDER — ALBUTEROL SULFATE HFA 108 (90 BASE) MCG/ACT IN AERS: 2.0000 | INHALATION_SPRAY | Freq: Two times a day (BID) | RESPIRATORY_TRACT | Status: DC | PRN

## 2013-02-27 MED ORDER — EPTIFIBATIDE 75 MG/100ML IV SOLN
2.0000 ug/kg/min | INTRAVENOUS | Status: DC
  Administered 2013-02-27 – 2013-03-01 (×2): 2 ug/kg/min via INTRAVENOUS
  Filled 2013-02-27 (×8): qty 100

## 2013-02-27 MED ORDER — FAMOTIDINE 20 MG PO TABS: 20.0000 mg | ORAL_TABLET | ORAL | Status: DC

## 2013-02-27 MED ORDER — ASPIRIN EC 81 MG PO TBEC
81.0000 mg | DELAYED_RELEASE_TABLET | Freq: Every day | ORAL | Status: DC
  Filled 2013-02-27 (×2): qty 1

## 2013-02-27 MED ORDER — PANTOPRAZOLE SODIUM 40 MG PO TBEC
40.0000 mg | DELAYED_RELEASE_TABLET | Freq: Every day | ORAL | Status: DC
  Administered 2013-02-27 – 2013-03-01 (×3): 40 mg via ORAL
  Filled 2013-02-27 (×4): qty 1

## 2013-02-27 NOTE — H&P (Signed)
[address] and       [name] [address]             [phone]      Teresa [name] is an 62 y.o. female.  WGN:[id number] Chief Complaint: Right upper lobe lung mass concerning for bronchogenic carcinoma HPI: Chief Complaint: Right upper lobe lung mass HPI:  62 year old female who has been followed for several months for a right upper lobe mass concerning for bronchogenic carcinoma. She was seen by Dr. Laneta Simmers last June in consultation for a right upper lobe nodular density noted on chest x-ray that has been followed with serial CT scans. She additionally has had a PET scan with abnormal SUV. Complicating the issue regarding resection has been severe coronary artery disease requiring multiple stent placements. She also has known COPD with history of tobacco abuse. She was seen recently again by Dr. Laneta Simmers who feels that her medical status is stable at this time for resection. Due to her multiple drug alluting stents she is felt to require early admission for Integrilin bridge.She will stop taking her plavix after her dose on 02/24/2013. She will be admitted on 02/27/2013 for Integrillin bridge for planned right upper lobectomy on 03/02/2013. She quit smoking in August of this year.    Past Medical History  Diagnosis Date  . GERD (gastroesophageal reflux disease)   . Hypercholesterolemia   . Seasonal allergies   . Tobacco abuse   . Cataract     "? right" (06/24/2012)  . Aortic valve stenosis, moderate   . Heart murmur   . Mass of lung     "right spot; they are watching it" (06/24/2012)  . Cellulitis 06/20/2012    "dog bite; right forearm" (06/24/2012)  . Arthritis     "across my hips; comes w/the weather" (06/24/2012)  . Nonspecific abnormal unspecified cardiovascular function study   . Peripheral vascular disease, unspecified   . CAD (coronary artery disease), native coronary artery   . Other and unspecified angina pectoris     Past Surgical History    Procedure Laterality Date  . Mouth surgery  2010?    "for bone loss" (06/24/2012)  . Video bronchoscopy  09/12/2011    Procedure: VIDEO BRONCHOSCOPY WITH FLUORO;  Surgeon: Nyoka Cowden, MD;  Location: [facility];  Service: Cardiopulmonary;  Laterality: Bilateral;  . Tonsillectomy  1960  . Ganglion cyst excision  1975  . Coronary angioplasty with stent placement  12/08/2012    DES to RCA   multiple stents also including left main, circumflex and LAD.  Family History  Problem Relation Age of Onset  . Alcohol abuse Father    Social History:  reports that she quit smoking about 8 months ago. Her smoking use included Cigarettes. She has a 20 pack-year smoking history. She has never used smokeless tobacco. She reports that she does not drink alcohol or use illicit drugs.  Allergies:  Allergies  Allergen Reactions  . Azithromycin Shortness Of Breath  . Ceclor [Cefaclor] Shortness Of Breath  . Morphine And Related     Terrible headache  . Penicillins Other (See Comments)    Unknown from childhood  . Vicodin [Hydrocodone-Acetaminophen] Nausea And Vomiting  . Septra [Sulfamethoxazole-Trimethoprim] Rash  . Adhesive [Tape] Other (See Comments)    Redness and swelling - use paper tape  . Doxycycline Other (See Comments)    Redness on the face    Medications Prior to Admission  Medication Sig Dispense  Refill  . acetaminophen (TYLENOL) 500 MG tablet Take 1,000 mg by mouth 3 (three) times daily as needed for pain.       [name] albuterol (PROVENTIL HFA) 108 (90 BASE) MCG/ACT inhaler Inhale 2 puffs into the lungs 2 (two) times daily as needed for wheezing or shortness of breath.       [name] aspirin EC 81 MG tablet Take 81 mg by mouth daily.      . cholecalciferol (VITAMIN D) 1000 UNITS tablet Take 1,000 Units by mouth at bedtime.       . clopidogrel (PLAVIX) 75 MG tablet Take 1 tablet (75 mg total) by mouth daily.  90 tablet  3  . Coenzyme Q10 (CO Q 10) 100 MG CAPS Take 100 mg by mouth at bedtime.        . lansoprazole (PREVACID) 30 MG capsule Take 1 capsule (30 mg total) by mouth daily.      [name] NASAL SALINE NA Place 1 spray into the nose daily as needed.       . nitroGLYCERIN (NITROSTAT) 0.4 MG SL tablet Place 1 tablet (0.4 mg total) under the tongue every 5 (five) minutes as needed for chest pain.  25 tablet  4  . OVER THE COUNTER MEDICATION Take 1 tablet by mouth daily with lunch. [facility] Menopause support      . ranitidine (ZANTAC) 150 MG tablet Take 150 mg by mouth as needed for heartburn.      . rosuvastatin (CRESTOR) 10 MG tablet Take 10 mg by mouth 3 (three) times a week. Monday, Wednesday, Friday        No results found for this or any previous visit (from the past 48 hour(s)). No results found.  Review of Systems - History obtained from the patient General ROS: positive for  - weight gain Psychological ROS: negative Ophthalmic ROS: negative ENT ROS: negative Allergy and Immunology ROS: negative Hematological and Lymphatic ROS: negative Endocrine ROS: positive for - unexpected weight changes Respiratory ROS: positive for - DOE Cardiovascular ROS: no recent CP  Gastrointestinal ROS: no abdominal pain, change in bowel habits, or black or bloody stools Genito-Urinary ROS: no dysuria, trouble voiding, or hematuria Musculoskeletal ROS: bil groin soreness from previous caths, sole low back arthritis sx Neurological ROS: no TIA or stroke symptoms Dermatological ROS: negative  Review of Systems  Constitutional: Negative.   HENT: Negative.   Eyes: Negative.   Respiratory: Positive for shortness of breath.   Cardiovascular: Negative.   Gastrointestinal: Positive for heartburn.  Genitourinary: Negative.   Musculoskeletal: Positive for back pain.  Skin: Negative.   Neurological: Negative.   Endo/Heme/Allergies: Negative.   Psychiatric/Behavioral: Negative.      Blood pressure 139/72, pulse 90, temperature 98.7 F (37.1 C), temperature source Oral, resp. rate 18, last  menstrual period 10/04/1999, SpO2 97.00%.   Physical Exam  Constitutional: She is oriented to person, place, and time. She appears well-developed and well-nourished.  HENT:  Head: Normocephalic and atraumatic.  Mouth/Throat: Oropharynx is clear and moist.  Eyes: Conjunctivae and EOM are normal. Pupils are equal, round, and reactive to light.  Neck: Normal range of motion. Neck supple. No JVD present. No tracheal deviation present. No thyromegaly present.  Cardiovascular: Normal rate and regular rhythm.   Murmur heard. Respiratory: Breath sounds normal. No stridor. No respiratory distress. She has no wheezes. She has no rales. She exhibits no tenderness.  GI: She exhibits no distension and no mass. There is no tenderness. There is no rebound and no  guarding.  Musculoskeletal: Normal range of motion. She exhibits no edema and no tenderness.  Lymphadenopathy:    She has no cervical adenopathy.  Neurological: She is oriented to person, place, and time.  Skin: Skin is warm and dry.  Psychiatric: She has a normal mood and affect.      Assessment/Plan  right upper lobe lung mass for planned resection on Monday, 03/02/2013. Plan is for Integrilin bridge due to multiple drug alluding stents. She is clinically quite stable at this time.  GOLD,WAYNE E 02/27/2013, 9:42 AM  Patient admitted by Dr Laneta Simmers for Integrelin  bridge. Surgery Monday I have seen and examined Guadalupe Maple and agree with the above assessment  and plan.  Delight Ovens MD Beeper [phone] Office [phone] 02/27/2013 3:44 PM

## 2013-02-27 NOTE — Progress Notes (Signed)
ANTICOAGULATION CONSULT NOTE - Initial Consult  Pharmacy Consult for Integrilin Indication: bridge prior to lung surgery, patient has multiple coronary stents   Allergies  Allergen Reactions  . Azithromycin Shortness Of Breath  . Ceclor [Cefaclor] Shortness Of Breath  . Morphine And Related     Terrible headache  . Penicillins Other (See Comments)    Unknown from childhood  . Vicodin [Hydrocodone-Acetaminophen] Nausea And Vomiting  . Septra [Sulfamethoxazole-Trimethoprim] Rash  . Adhesive [Tape] Other (See Comments)    Redness and swelling - use paper tape  . Doxycycline Other (See Comments)    Redness on the face    Patient Measurements:    Weight: 63 kg on 02/04/13  Vital Signs: Temp: 98.7 F (37.1 C) (12/26 0921) Temp src: Oral (12/26 0921) BP: 139/72 mmHg (12/26 0921) Pulse Rate: 90 (12/26 0921)  Labs: No results found for this basename: HGB, HCT, PLT, APTT, LABPROT, INR, HEPARINUNFRC, CREATININE, CKTOTAL, CKMB, TROPONINI,  in the last 72 hours  The CrCl is unknown because both a height and weight (above a minimum accepted value) are required for this calculation.   Medical History: Past Medical History  Diagnosis Date  . GERD (gastroesophageal reflux disease)   . Hypercholesterolemia   . Seasonal allergies   . Tobacco abuse   . Cataract     "? right" (06/24/2012)  . Aortic valve stenosis, moderate   . Heart murmur   . Mass of lung     "right spot; they are watching it" (06/24/2012)  . Cellulitis 06/20/2012    "dog bite; right forearm" (06/24/2012)  . Arthritis     "across my hips; comes w/the weather" (06/24/2012)  . Nonspecific abnormal unspecified cardiovascular function study   . Peripheral vascular disease, unspecified   . CAD (coronary artery disease), native coronary artery   . Other and unspecified angina pectoris     Medications:  Prescriptions prior to admission  Medication Sig Dispense Refill  . acetaminophen (TYLENOL) 500 MG tablet Take 1,000  mg by mouth 3 (three) times daily as needed for pain.       [name] albuterol (PROVENTIL HFA) 108 (90 BASE) MCG/ACT inhaler Inhale 2 puffs into the lungs 2 (two) times daily as needed for wheezing or shortness of breath.       [name] aspirin EC 81 MG tablet Take 81 mg by mouth daily.      . cholecalciferol (VITAMIN D) 1000 UNITS tablet Take 1,000 Units by mouth at bedtime.       . clopidogrel (PLAVIX) 75 MG tablet Take 1 tablet (75 mg total) by mouth daily.  90 tablet  3  . Coenzyme Q10 (CO Q 10) 100 MG CAPS Take 100 mg by mouth at bedtime.       . lansoprazole (PREVACID) 30 MG capsule Take 1 capsule (30 mg total) by mouth daily.      [name] NASAL SALINE NA Place 1 spray into the nose daily as needed.       . nitroGLYCERIN (NITROSTAT) 0.4 MG SL tablet Place 1 tablet (0.4 mg total) under the tongue every 5 (five) minutes as needed for chest pain.  25 tablet  4  . OVER THE COUNTER MEDICATION Take 1 tablet by mouth daily with lunch. [facility] Menopause support      . ranitidine (ZANTAC) 150 MG tablet Take 150 mg by mouth as needed for heartburn.      . rosuvastatin (CRESTOR) 10 MG tablet Take 10 mg by mouth 3 (three) times a week.  Monday, Wednesday, Friday        Assessment:  62 yo F with right upper lobe mass concerning for bronchogenic carcinoma.  Due to her multiple drug eluting stents she is felt to require early admission for Integrilin bridge.She was told to stop taking her plavix after her dose on 02/24/2013. She will be admitted on 02/27/2013 for Integrilin bridge for planned right upper lobectomy on 03/02/2013. She quit smoking in August of this year. Her wt on 02/04/13 = 63 kg.  Her previous creatinine was WNL, so anticipate her creat cl > 50 ml/min.     Goal of Therapy:  Monitor platelets by anticoagulation protocol: Yes   Plan:  1. Integrilin bolus 180 mcg/kg x 1 over 1-2 minutes 2. Integrilin drip at 2 mcg/kg/minute 3. Check 8 hr CBC and daily CBC to f/u platelet count 4. F/u renal function. 5. F/u for  signs of bleeding.  Herby Abraham, Pharm.D. [phone] 02/27/2013 11:01 AM

## 2013-02-28 DIAGNOSIS — D381 Neoplasm of uncertain behavior of trachea, bronchus and lung: Secondary | ICD-10-CM

## 2013-02-28 DIAGNOSIS — R222 Localized swelling, mass and lump, trunk: Secondary | ICD-10-CM

## 2013-02-28 LAB — CBC
HCT: 35.3 % — ABNORMAL LOW (ref 36.0–46.0)
Hemoglobin: 12.6 g/dL (ref 12.0–15.0)
Hemoglobin: 11.7 g/dL — ABNORMAL LOW (ref 12.0–15.0)
MCH: 30.1 pg (ref 26.0–34.0)
MCHC: 33.3 g/dL (ref 30.0–36.0)
MCHC: 33.1 g/dL (ref 30.0–36.0)
Platelets: 282 10*3/uL (ref 150–400)
Platelets: 269 10*3/uL (ref 150–400)
RBC: 4.19 MIL/uL (ref 3.87–5.11)
RDW: 13.1 % (ref 11.5–15.5)
WBC: 6.2 10*3/uL (ref 4.0–10.5)

## 2013-02-28 LAB — TYPE AND SCREEN: ABO/RH(D): A POS

## 2013-02-28 NOTE — Progress Notes (Signed)
Patient asking about home medications. Stated she had not received any medications since arrival to floor including her home medications. Discussed with Pharmacy. In the [facility] she had received both Lipitor and Plavix around noon. Patient states this is not accurate and takes these medications daily at bedtime. Patient is Alert and Oriented times 4. Pharmacy was called and sent another dose. Patient received lipitor and plavix at 2349.   Valinda Hoar RN

## 2013-02-28 NOTE — Progress Notes (Signed)
Alerted by Gershon Crane of her admission with integrillin bridge. Right upper lobe mass. Plan OR Monday.   Please let us know if any assistance is needed.   Primary Cardiologist: Dr. Eldridge Dace.

## 2013-02-28 NOTE — Progress Notes (Addendum)
      [name]       [place] [address]             [phone]         Procedure(s) (LRB): FLEXIBLE BRONCHOSCOPY (N/A) THORACOTOMY/LOBECTOMY (Right) Subjective: Feels very well  Objective  Telemetry sinus rhythm  Temp:  [97.3 F (36.3 C)-98.8 F (37.1 C)] 97.3 F (36.3 C) (12/27 0400) Pulse Rate:  [78-90] 83 (12/27 0400) Resp:  [17-19] 18 (12/27 0400) BP: (109-139)/(56-72) 109/63 mmHg (12/27 0400) SpO2:  [94 %-97 %] 94 % (12/27 0400) Weight:  [138 lb 14.2 oz (63 kg)-141 lb 12.8 oz (64.32 kg)] 141 lb 12.8 oz (64.32 kg) (12/27 0400)  No intake or output data in the 24 hours ending 02/28/13 0755     General appearance: alert, cooperative and no distress Heart: regular rate and rhythm Lungs: clear to auscultation bilaterally Abdomen: benign Extremities: no edema Wound: n/a  Lab Results:  Recent Labs  02/27/13 1130  NA 136  K 4.3  CL 100  CO2 23  GLUCOSE 122*  BUN 13  CREATININE 0.60  CALCIUM 9.2    Recent Labs  02/27/13 1130  AST 23  ALT 16  ALKPHOS 77  BILITOT 0.3  PROT 7.8  ALBUMIN 4.0   No results found for this basename: LIPASE, AMYLASE,  in the last 72 hours  Recent Labs  02/27/13 1130  WBC 6.1  HGB 12.4  HCT 37.5  MCV 89.1  PLT 264   No results found for this basename: CKTOTAL, CKMB, TROPONINI,  in the last 72 hours No components found with this basename: POCBNP,  No results found for this basename: DDIMER,  in the last 72 hours No results found for this basename: HGBA1C,  in the last 72 hours No results found for this basename: CHOL, HDL, LDLCALC, TRIG, CHOLHDL,  in the last 72 hours No results found for this basename: TSH, T4TOTAL, FREET3, T3FREE, THYROIDAB,  in the last 72 hours No results found for this basename: VITAMINB12, FOLATE, FERRITIN, TIBC, IRON, RETICCTPCT,  in the last 72 hours  Medications: Scheduled . aspirin EC  81 mg Oral Daily  . atorvastatin  20 mg Oral 3 times weekly  . docusate sodium   100 mg Oral BID  . pantoprazole  40 mg Oral Daily  . sodium chloride  3 mL Intravenous Q12H     Radiology/Studies:  No results found.  INR: Will add last result for INR, ABG once components are confirmed Will add last 4 CBG results once components are confirmed  Assessment/Plan: S/P Procedure(s) (LRB): FLEXIBLE BRONCHOSCOPY (N/A) THORACOTOMY/LOBECTOMY (Right)  1 stable on integrillin, labs ok, hemodynamically stable 2 for OR monday  LOS: 1 day    GOLD,Teresa [name] 12/27/20147:55 AM  Off asa and plavix, on integrillin No complaints I have seen and examined Guadalupe Maple and agree with the above assessment  and plan.  Delight Ovens MD Beeper [phone] Office [phone] 02/28/2013 12:00 PM

## 2013-03-01 ENCOUNTER — Other Ambulatory Visit: Payer: Self-pay

## 2013-03-01 ENCOUNTER — Inpatient Hospital Stay (HOSPITAL_COMMUNITY): Payer: BC Managed Care – PPO

## 2013-03-01 LAB — BLOOD GAS, ARTERIAL
Acid-Base Excess: 2.9 mmol/L — ABNORMAL HIGH (ref 0.0–2.0)
Bicarbonate: 26.9 mEq/L — ABNORMAL HIGH (ref 20.0–24.0)
Drawn by: 246861
FIO2: 0.21 %
O2 Saturation: 97.5 %
Patient temperature: 98.6
TCO2: 28.2 mmol/L (ref 0–100)
pCO2 arterial: 41.8 mmHg (ref 35.0–45.0)
pH, Arterial: 7.425 (ref 7.350–7.450)
pO2, Arterial: 90.3 mmHg (ref 80.0–100.0)

## 2013-03-01 LAB — URINALYSIS, ROUTINE W REFLEX MICROSCOPIC
Bilirubin Urine: NEGATIVE
Glucose, UA: NEGATIVE mg/dL
Hgb urine dipstick: NEGATIVE
Ketones, ur: NEGATIVE mg/dL
Leukocytes, UA: NEGATIVE
Nitrite: NEGATIVE
Protein, ur: NEGATIVE mg/dL
Specific Gravity, Urine: 1.023 (ref 1.005–1.030)
Urobilinogen, UA: 1 mg/dL (ref 0.0–1.0)
pH: 6.5 (ref 5.0–8.0)

## 2013-03-01 LAB — SURGICAL PCR SCREEN
MRSA, PCR: NEGATIVE
Staphylococcus aureus: NEGATIVE

## 2013-03-01 LAB — CBC
HCT: 35.7 % — ABNORMAL LOW (ref 36.0–46.0)
Hemoglobin: 11.3 g/dL — ABNORMAL LOW (ref 12.0–15.0)
Hemoglobin: 11.7 g/dL — ABNORMAL LOW (ref 12.0–15.0)
MCH: 29.1 pg (ref 26.0–34.0)
MCH: 29.3 pg (ref 26.0–34.0)
MCHC: 32.8 g/dL (ref 30.0–36.0)
MCV: 89.5 fL (ref 78.0–100.0)
Platelets: 261 10*3/uL (ref 150–400)
RBC: 3.88 MIL/uL (ref 3.87–5.11)
RBC: 3.99 MIL/uL (ref 3.87–5.11)
RDW: 13.2 % (ref 11.5–15.5)
RDW: 13.1 % (ref 11.5–15.5)
WBC: 5.6 10*3/uL (ref 4.0–10.5)

## 2013-03-01 LAB — COMPREHENSIVE METABOLIC PANEL
ALT: 13 U/L (ref 0–35)
AST: 14 U/L (ref 0–37)
Albumin: 3.4 g/dL — ABNORMAL LOW (ref 3.5–5.2)
Alkaline Phosphatase: 70 U/L (ref 39–117)
BUN: 17 mg/dL (ref 6–23)
CO2: 25 mEq/L (ref 19–32)
Calcium: 8.8 mg/dL (ref 8.4–10.5)
Chloride: 102 mEq/L (ref 96–112)
Creatinine, Ser: 0.7 mg/dL (ref 0.50–1.10)
GFR calc Af Amer: >90 mL/min (ref >90)
GFR calc non Af Amer: >90 mL/min (ref >90)
Glucose, Bld: 127 mg/dL — ABNORMAL HIGH (ref 70–99)
Potassium: 3.8 mEq/L (ref 3.5–5.1)
Sodium: 137 mEq/L (ref 135–145)
Total Bilirubin: 0.2 mg/dL — ABNORMAL LOW (ref 0.3–1.2)
Total Protein: 7 g/dL (ref 6.0–8.3)

## 2013-03-01 LAB — APTT: aPTT: 24 seconds (ref 24–37)

## 2013-03-01 LAB — PROTIME-INR
INR: 0.99 (ref 0.00–1.49)
Prothrombin Time: 12.9 seconds (ref 11.6–15.2)

## 2013-03-01 MED ORDER — VANCOMYCIN HCL IN DEXTROSE 1-5 GM/200ML-% IV SOLN
1000.0000 mg | INTRAVENOUS | Status: AC
  Administered 2013-03-02: 1000 mg via INTRAVENOUS
  Filled 2013-03-01: qty 200

## 2013-03-01 MED ORDER — CHLORHEXIDINE GLUCONATE CLOTH 2 % EX PADS
6.0000 | MEDICATED_PAD | Freq: Every day | CUTANEOUS | Status: DC
  Administered 2013-03-02: 6 via TOPICAL

## 2013-03-01 MED ORDER — EPTIFIBATIDE 75 MG/100ML IV SOLN
2.0000 ug/kg/min | INTRAVENOUS | Status: DC
  Administered 2013-03-01 (×2): 2 ug/kg/min via INTRAVENOUS
  Filled 2013-03-01 (×4): qty 100

## 2013-03-01 NOTE — Progress Notes (Signed)
Awaiting lobectomy, Integrilin drip, previous PCI's  Telemetry sinus rhythm  Plan, OR Monday  Please let us know if any assistance is needed.  Primary cardiologist, Dr. Eldridge Dace.

## 2013-03-01 NOTE — Progress Notes (Addendum)
      [address]       [place] [address]             [phone]         Procedure(s) (LRB): FLEXIBLE BRONCHOSCOPY (N/A) THORACOTOMY/LOBECTOMY (Right) Subjective: Feels well  Objective  Telemetry sinus rhythm/sinus tach  Temp:  [98.1 F (36.7 C)-98.4 F (36.9 C)] 98.1 F (36.7 C) (12/28 0410) Pulse Rate:  [81-95] 82 (12/28 0410) Resp:  [17-18] 17 (12/28 0410) BP: (103-130)/(38-55) 116/54 mmHg (12/28 0410) SpO2:  [96 %] 96 % (12/28 0410) Weight:  [143 lb 1.3 oz (64.9 kg)] 143 lb 1.3 oz (64.9 kg) (12/28 0410)   Intake/Output Summary (Last 24 hours) at 03/01/13 0827 Last data filed at 03/01/13 0811  Gross per 24 hour  Intake   1080 ml  Output      0 ml  Net   1080 ml       General appearance: alert, cooperative and no distress Heart: regular rate and rhythm Lungs: clear to auscultation bilaterally  Lab Results:  Recent Labs  02/27/13 1130  NA 136  K 4.3  CL 100  CO2 23  GLUCOSE 122*  BUN 13  CREATININE 0.60  CALCIUM 9.2    Recent Labs  02/27/13 1130  AST 23  ALT 16  ALKPHOS 77  BILITOT 0.3  PROT 7.8  ALBUMIN 4.0   No results found for this basename: LIPASE, AMYLASE,  in the last 72 hours  Recent Labs  02/28/13 1950 03/01/13 0600  WBC 6.2 4.5  HGB 11.7* 11.3*  HCT 35.3* 34.9*  MCV 88.9 89.9  PLT 269 254   No results found for this basename: CKTOTAL, CKMB, TROPONINI,  in the last 72 hours No components found with this basename: POCBNP,  No results found for this basename: DDIMER,  in the last 72 hours No results found for this basename: HGBA1C,  in the last 72 hours No results found for this basename: CHOL, HDL, LDLCALC, TRIG, CHOLHDL,  in the last 72 hours No results found for this basename: TSH, T4TOTAL, FREET3, T3FREE, THYROIDAB,  in the last 72 hours No results found for this basename: VITAMINB12, FOLATE, FERRITIN, TIBC, IRON, RETICCTPCT,  in the last 72 hours  Medications: Scheduled . atorvastatin  20 mg  Oral 3 times weekly  . docusate sodium  100 mg Oral BID  . pantoprazole  40 mg Oral Daily  . sodium chloride  3 mL Intravenous Q12H     Radiology/Studies:  No results found.  INR: Will add last result for INR, ABG once components are confirmed Will add last 4 CBG results once components are confirmed  Assessment/Plan: S/P Procedure(s) (LRB): FLEXIBLE BRONCHOSCOPY (N/A) THORACOTOMY/LOBECTOMY (Right)  1 conts to do well, for OR in am    LOS: 2 days    GOLD,WAYNE E 12/28/20148:27 AM  I have seen and examined Teresa [name] and agree with the above assessment  and plan.  Delight Ovens MD Beeper [phone] Office [phone] 03/01/2013 9:09 AM

## 2013-03-02 ENCOUNTER — Encounter (HOSPITAL_COMMUNITY): Payer: Self-pay | Admitting: Certified Registered Nurse Anesthetist

## 2013-03-02 ENCOUNTER — Inpatient Hospital Stay (HOSPITAL_COMMUNITY): Payer: BC Managed Care – PPO | Admitting: Certified Registered"

## 2013-03-02 ENCOUNTER — Encounter (HOSPITAL_COMMUNITY): Payer: BC Managed Care – PPO | Admitting: Certified Registered"

## 2013-03-02 ENCOUNTER — Encounter (HOSPITAL_COMMUNITY)
Admission: RE | Disposition: A | Discharge status: Home / Self Care | Payer: Self-pay | Source: Ambulatory Visit | Attending: Surgery

## 2013-03-02 ENCOUNTER — Inpatient Hospital Stay (HOSPITAL_COMMUNITY): Payer: BC Managed Care – PPO

## 2013-03-02 DIAGNOSIS — D381 Neoplasm of uncertain behavior of trachea, bronchus and lung: Secondary | ICD-10-CM

## 2013-03-02 DIAGNOSIS — I2510 Atherosclerotic heart disease of native coronary artery without angina pectoris: Secondary | ICD-10-CM

## 2013-03-02 HISTORY — PX: THORACOTOMY/LOBECTOMY: SHX6116

## 2013-03-02 HISTORY — PX: FLEXIBLE BRONCHOSCOPY: SHX5094

## 2013-03-02 LAB — GLUCOSE, CAPILLARY
Glucose-Capillary: 167 mg/dL — ABNORMAL HIGH (ref 70–99)
Glucose-Capillary: 142 mg/dL — ABNORMAL HIGH (ref 70–99)

## 2013-03-02 LAB — CBC
Platelets: 302 10*3/uL (ref 150–400)
RBC: 4.2 MIL/uL (ref 3.87–5.11)
RDW: 13.3 % (ref 11.5–15.5)
WBC: 7 10*3/uL (ref 4.0–10.5)

## 2013-03-02 SURGERY — BRONCHOSCOPY, FLEXIBLE
Anesthesia: General | Site: Chest | Laterality: Right

## 2013-03-02 MED ORDER — ROCURONIUM BROMIDE 100 MG/10ML IV SOLN
INTRAVENOUS | Status: DC | PRN
  Administered 2013-03-02: 50 mg via INTRAVENOUS

## 2013-03-02 MED ORDER — FENTANYL CITRATE 0.05 MG/ML IJ SOLN
INTRAMUSCULAR | Status: DC | PRN
  Administered 2013-03-02 (×2): 50 ug via INTRAVENOUS
  Administered 2013-03-02: 100 ug via INTRAVENOUS
  Administered 2013-03-02: 50 ug via INTRAVENOUS
  Administered 2013-03-02: 100 ug via INTRAVENOUS

## 2013-03-02 MED ORDER — HEMOSTATIC AGENTS (NO CHARGE) OPTIME
TOPICAL | Status: DC | PRN
  Administered 2013-03-02: 1 via TOPICAL

## 2013-03-02 MED ORDER — ONDANSETRON HCL 4 MG/2ML IJ SOLN
INTRAMUSCULAR | Status: DC | PRN
  Administered 2013-03-02: 4 mg via INTRAVENOUS

## 2013-03-02 MED ORDER — KCL IN DEXTROSE-NACL 20-5-0.45 MEQ/L-%-% IV SOLN
INTRAVENOUS | Status: DC
  Administered 2013-03-02 – 2013-03-03 (×3): via INTRAVENOUS
  Filled 2013-03-02 (×4): qty 1000

## 2013-03-02 MED ORDER — HYDROMORPHONE HCL PF 1 MG/ML IJ SOLN
0.2500 mg | INTRAMUSCULAR | Status: DC | PRN
  Administered 2013-03-02 (×2): 0.5 mg via INTRAVENOUS
  Administered 2013-03-02 (×2): 0.25 mg via INTRAVENOUS
  Administered 2013-03-02: 0.5 mg via INTRAVENOUS

## 2013-03-02 MED ORDER — DIPHENHYDRAMINE HCL 50 MG/ML IJ SOLN
12.5000 mg | Freq: Four times a day (QID) | INTRAMUSCULAR | Status: DC | PRN
  Filled 2013-03-02: qty 0.25

## 2013-03-02 MED ORDER — SODIUM CHLORIDE 0.9 % IV SOLN
10.0000 mg | INTRAVENOUS | Status: DC | PRN
  Administered 2013-03-02: 20 ug/min via INTRAVENOUS

## 2013-03-02 MED ORDER — KETOROLAC TROMETHAMINE 15 MG/ML IJ SOLN
15.0000 mg | Freq: Four times a day (QID) | INTRAMUSCULAR | Status: DC | PRN
  Administered 2013-03-02 – 2013-03-03 (×2): 15 mg via INTRAVENOUS
  Filled 2013-03-02 (×2): qty 1

## 2013-03-02 MED ORDER — SODIUM CHLORIDE 0.9 % IJ SOLN: 9.0000 mL | INTRAMUSCULAR | Status: DC | PRN

## 2013-03-02 MED ORDER — OXYCODONE HCL 5 MG/5ML PO SOLN: 5.0000 mg | Freq: Once | ORAL | Status: DC | PRN

## 2013-03-02 MED ORDER — ONDANSETRON HCL 4 MG/2ML IJ SOLN: 4.0000 mg | Freq: Four times a day (QID) | INTRAMUSCULAR | Status: DC | PRN

## 2013-03-02 MED ORDER — MIDAZOLAM HCL 5 MG/5ML IJ SOLN
INTRAMUSCULAR | Status: DC | PRN
  Administered 2013-03-02: 2 mg via INTRAVENOUS

## 2013-03-02 MED ORDER — DEXAMETHASONE SODIUM PHOSPHATE 4 MG/ML IJ SOLN
INTRAMUSCULAR | Status: DC | PRN
  Administered 2013-03-02: 8 mg via INTRAVENOUS

## 2013-03-02 MED ORDER — HYDROMORPHONE HCL PF 1 MG/ML IJ SOLN
INTRAMUSCULAR | Status: AC
  Filled 2013-03-02: qty 1

## 2013-03-02 MED ORDER — VECURONIUM BROMIDE 10 MG IV SOLR
INTRAVENOUS | Status: DC | PRN
  Administered 2013-03-02 (×3): 1 mg via INTRAVENOUS

## 2013-03-02 MED ORDER — POTASSIUM CHLORIDE 10 MEQ/50ML IV SOLN
10.0000 meq | Freq: Every day | INTRAVENOUS | Status: DC | PRN
  Filled 2013-03-02: qty 50

## 2013-03-02 MED ORDER — PROPOFOL 10 MG/ML IV BOLUS
INTRAVENOUS | Status: DC | PRN
  Administered 2013-03-02: 150 mg via INTRAVENOUS
  Administered 2013-03-02: 30 mg via INTRAVENOUS

## 2013-03-02 MED ORDER — HYDROMORPHONE HCL PF 1 MG/ML IJ SOLN: 0.2500 mg | INTRAMUSCULAR | Status: DC | PRN

## 2013-03-02 MED ORDER — LACTATED RINGERS IV SOLN
INTRAVENOUS | Status: DC | PRN
  Administered 2013-03-02: 07:00:00 via INTRAVENOUS

## 2013-03-02 MED ORDER — NEOSTIGMINE METHYLSULFATE 1 MG/ML IJ SOLN
INTRAMUSCULAR | Status: DC | PRN
  Administered 2013-03-02: 5 mg via INTRAVENOUS

## 2013-03-02 MED ORDER — BUPIVACAINE 0.5 % ON-Q PUMP SINGLE CATH 400 ML
400.0000 mL | INJECTION | Status: DC
  Filled 2013-03-02: qty 400

## 2013-03-02 MED ORDER — OXYCODONE HCL 5 MG PO TABS: 5.0000 mg | ORAL_TABLET | Freq: Once | ORAL | Status: DC | PRN

## 2013-03-02 MED ORDER — INSULIN ASPART 100 UNIT/ML ~~LOC~~ SOLN
0.0000 [IU] | SUBCUTANEOUS | Status: DC
  Administered 2013-03-02 (×2): 2 [IU] via SUBCUTANEOUS

## 2013-03-02 MED ORDER — ACETAMINOPHEN 500 MG PO TABS
1000.0000 mg | ORAL_TABLET | Freq: Four times a day (QID) | ORAL | Status: AC
  Administered 2013-03-02 – 2013-03-03 (×3): 1000 mg via ORAL
  Filled 2013-03-02 (×2): qty 2

## 2013-03-02 MED ORDER — ACETAMINOPHEN 160 MG/5ML PO SOLN: 1000.0000 mg | Freq: Four times a day (QID) | ORAL | Status: AC

## 2013-03-02 MED ORDER — ARTIFICIAL TEARS OP OINT
TOPICAL_OINTMENT | OPHTHALMIC | Status: DC | PRN
  Administered 2013-03-02: 1 via OPHTHALMIC

## 2013-03-02 MED ORDER — 0.9 % SODIUM CHLORIDE (POUR BTL) OPTIME
TOPICAL | Status: DC | PRN
  Administered 2013-03-02: 2000 mL

## 2013-03-02 MED ORDER — GLYCOPYRROLATE 0.2 MG/ML IJ SOLN
INTRAMUSCULAR | Status: DC | PRN
  Administered 2013-03-02: .8 mg via INTRAVENOUS

## 2013-03-02 MED ORDER — FENTANYL 10 MCG/ML IV SOLN
INTRAVENOUS | Status: DC
  Administered 2013-03-02 – 2013-03-03 (×3): 15 ug via INTRAVENOUS
  Administered 2013-03-03: 30 ug via INTRAVENOUS
  Administered 2013-03-03: 75 ug via INTRAVENOUS
  Administered 2013-03-03: 30 ug via INTRAVENOUS
  Administered 2013-03-03: 75 ug via INTRAVENOUS
  Administered 2013-03-04: 105 ug via INTRAVENOUS
  Administered 2013-03-04: 45 ug via INTRAVENOUS
  Administered 2013-03-04: 75 ug via INTRAVENOUS
  Administered 2013-03-04: 15 ug via INTRAVENOUS
  Administered 2013-03-04: 30 ug via INTRAVENOUS
  Administered 2013-03-04: 15 ug via INTRAVENOUS
  Administered 2013-03-05: 60 ug via INTRAVENOUS
  Administered 2013-03-05: 30 ug via INTRAVENOUS
  Administered 2013-03-05: 105 ug via INTRAVENOUS
  Filled 2013-03-02 (×3): qty 50

## 2013-03-02 MED ORDER — LEVALBUTEROL HCL 0.63 MG/3ML IN NEBU
0.6300 mg | INHALATION_SOLUTION | Freq: Four times a day (QID) | RESPIRATORY_TRACT | Status: DC
  Administered 2013-03-02 – 2013-03-03 (×5): 0.63 mg via RESPIRATORY_TRACT
  Filled 2013-03-02 (×8): qty 3

## 2013-03-02 MED ORDER — NALOXONE HCL 0.4 MG/ML IJ SOLN
0.4000 mg | INTRAMUSCULAR | Status: DC | PRN
  Filled 2013-03-02: qty 1

## 2013-03-02 MED ORDER — ONDANSETRON HCL 4 MG/2ML IJ SOLN
4.0000 mg | Freq: Four times a day (QID) | INTRAMUSCULAR | Status: DC | PRN
  Administered 2013-03-02: 4 mg via INTRAVENOUS
  Filled 2013-03-02: qty 2

## 2013-03-02 MED ORDER — ONDANSETRON HCL 4 MG/2ML IJ SOLN: 4.0000 mg | Freq: Once | INTRAMUSCULAR | Status: DC | PRN

## 2013-03-02 MED ORDER — BUPIVACAINE 0.5 % ON-Q PUMP SINGLE CATH 400 ML
INJECTION | Status: DC | PRN
  Administered 2013-03-02: 400 mL

## 2013-03-02 MED ORDER — SENNOSIDES-DOCUSATE SODIUM 8.6-50 MG PO TABS
1.0000 | ORAL_TABLET | Freq: Every evening | ORAL | Status: DC | PRN
  Filled 2013-03-02: qty 1

## 2013-03-02 MED ORDER — BISACODYL 5 MG PO TBEC
10.0000 mg | DELAYED_RELEASE_TABLET | Freq: Every day | ORAL | Status: DC
  Administered 2013-03-03: 10 mg via ORAL
  Filled 2013-03-02: qty 2

## 2013-03-02 MED ORDER — DIPHENHYDRAMINE HCL 12.5 MG/5ML PO ELIX
12.5000 mg | ORAL_SOLUTION | Freq: Four times a day (QID) | ORAL | Status: DC | PRN
  Filled 2013-03-02: qty 5

## 2013-03-02 SURGICAL SUPPLY — 96 items
ADH SKN CLS LQ APL DERMABOND (GAUZE/BANDAGES/DRESSINGS) ×2
BALL CTTN LRG ABS STRL LF (GAUZE/BANDAGES/DRESSINGS)
BLADE SURG 11 STRL SS (BLADE) IMPLANT
BRUSH CYTOL CELLEBRITY 1.5X140 (MISCELLANEOUS) IMPLANT
CANISTER SUCTION 2500CC (MISCELLANEOUS) ×7 IMPLANT
CATH KIT ON Q 5IN SLV (PAIN MANAGEMENT) IMPLANT
CATH THORACIC 28FR (CATHETERS) IMPLANT
CATH THORACIC 36FR (CATHETERS) IMPLANT
CATH THORACIC 36FR RT ANG (CATHETERS) IMPLANT
CLIP TI MEDIUM 24 (CLIP) ×3 IMPLANT
CLIP TI WIDE RED SMALL 24 (CLIP) ×1 IMPLANT
CONN 1/2X1/2X1/2  BEN (MISCELLANEOUS) ×1
CONN 1/2X1/2X1/2 BEN (MISCELLANEOUS) IMPLANT
CONN 3/8X3/8 GISH STERILE (MISCELLANEOUS) ×1 IMPLANT
CONN ST 1/4X3/8  BEN (MISCELLANEOUS) ×1
CONN ST 1/4X3/8 BEN (MISCELLANEOUS) IMPLANT
CONT SPEC 4OZ CLIKSEAL STRL BL (MISCELLANEOUS) ×10 IMPLANT
COTTONBALL LRG STERILE PKG (GAUZE/BANDAGES/DRESSINGS) IMPLANT
COVER SURGICAL LIGHT HANDLE (MISCELLANEOUS) ×9 IMPLANT
COVER TABLE BACK 60X90 (DRAPES) ×2 IMPLANT
DERMABOND ADHESIVE PROPEN (GAUZE/BANDAGES/DRESSINGS) ×1
DERMABOND ADVANCED .7 DNX6 (GAUZE/BANDAGES/DRESSINGS) IMPLANT
DRAPE LAPAROSCOPIC ABDOMINAL (DRAPES) ×3 IMPLANT
DRAPE WARM FLUID 44X44 (DRAPE) ×3 IMPLANT
DRILL BIT 7/64X5 (BIT) ×1 IMPLANT
DRSG TEGADERM 4X4.75 (GAUZE/BANDAGES/DRESSINGS) ×1 IMPLANT
ELECT REM PT RETURN 9FT ADLT (ELECTROSURGICAL) ×3
ELECTRODE REM PT RTRN 9FT ADLT (ELECTROSURGICAL) ×2 IMPLANT
FORCEPS BIOP RJ4 1.8 (CUTTING FORCEPS) IMPLANT
GLOVE BIO SURGEON STRL SZ 6.5 (GLOVE) ×2 IMPLANT
GLOVE BIOGEL PI IND STRL 6.5 (GLOVE) IMPLANT
GLOVE BIOGEL PI INDICATOR 6.5 (GLOVE) ×1
GLOVE ECLIPSE 7.0 STRL STRAW (GLOVE) ×2 IMPLANT
GLOVE EUDERMIC 7 POWDERFREE (GLOVE) ×8 IMPLANT
GLOVE SURG SS PI 6.0 STRL IVOR (GLOVE) ×1 IMPLANT
GLOVE SURG SS PI 7.0 STRL IVOR (GLOVE) ×2 IMPLANT
GOWN PREVENTION PLUS XLARGE (GOWN DISPOSABLE) ×4 IMPLANT
GOWN STRL NON-REIN LRG LVL3 (GOWN DISPOSABLE) ×6 IMPLANT
KIT BASIN OR (CUSTOM PROCEDURE TRAY) ×3 IMPLANT
KIT ROOM TURNOVER OR (KITS) ×5 IMPLANT
MARKER SKIN DUAL TIP RULER LAB (MISCELLANEOUS) ×2 IMPLANT
NDL BIOPSY TRANSBRONCH 21G (NEEDLE) IMPLANT
NEEDLE 22X1 1/2 (OR ONLY) (NEEDLE) IMPLANT
NEEDLE BIOPSY TRANSBRONCH 21G (NEEDLE) IMPLANT
NS IRRIG 1000ML POUR BTL (IV SOLUTION) ×8 IMPLANT
OIL SILICONE PENTAX (PARTS (SERVICE/REPAIRS)) ×2 IMPLANT
PACK CHEST (CUSTOM PROCEDURE TRAY) ×3 IMPLANT
PAD ARMBOARD 7.5X6 YLW CONV (MISCELLANEOUS) ×12 IMPLANT
RELOAD EGIA 60 MED/THCK PURPLE (STAPLE) ×9 IMPLANT
RELOAD EGIA TRIS TAN 45 CVD (STAPLE) ×12 IMPLANT
RELOAD STAPLE 45 TAN MED CVD (STAPLE) IMPLANT
RELOAD STAPLE 60 BLK XTHK ART (STAPLE) IMPLANT
RELOAD STAPLE 60 MED/THCK ART (STAPLE) IMPLANT
RELOAD TRI 2.0 60 XTHK VAS SUL (STAPLE) ×3 IMPLANT
SCISSORS LAP 5X35 DISP (ENDOMECHANICALS) ×1 IMPLANT
SEALANT SURG COSEAL 4ML (VASCULAR PRODUCTS) ×1 IMPLANT
SEALANT SURG COSEAL 8ML (VASCULAR PRODUCTS) IMPLANT
SOLUTION ANTI FOG 6CC (MISCELLANEOUS) ×3 IMPLANT
SPECIMEN JAR MEDIUM (MISCELLANEOUS) ×3 IMPLANT
SPONGE GAUZE 4X4 12PLY (GAUZE/BANDAGES/DRESSINGS) ×5 IMPLANT
SPONGE GAUZE 4X4 12PLY STER LF (GAUZE/BANDAGES/DRESSINGS) ×1 IMPLANT
STAPLER TA30 4.8 NON-ABS (STAPLE) ×1 IMPLANT
SUT PROLENE 3 0 SH DA (SUTURE) IMPLANT
SUT PROLENE 4 0 RB 1 (SUTURE)
SUT PROLENE 4-0 RB1 .5 CRCL 36 (SUTURE) IMPLANT
SUT PROLENE 5 0 C 1 36 (SUTURE) ×2 IMPLANT
SUT SILK  1 MH (SUTURE) ×3
SUT SILK 1 MH (SUTURE) ×4 IMPLANT
SUT SILK 2 0 SH (SUTURE) ×3 IMPLANT
SUT SILK 2 0SH CR/8 30 (SUTURE) IMPLANT
SUT SILK 3 0 SH CR/8 (SUTURE) IMPLANT
SUT VIC AB 1 CTX 36 (SUTURE) ×3
SUT VIC AB 1 CTX36XBRD ANBCTR (SUTURE) ×4 IMPLANT
SUT VIC AB 2 TP1 27 (SUTURE) ×3 IMPLANT
SUT VIC AB 2-0 CT1 27 (SUTURE) ×3
SUT VIC AB 2-0 CT1 TAPERPNT 27 (SUTURE) IMPLANT
SUT VIC AB 2-0 CTX 36 (SUTURE) ×6 IMPLANT
SUT VIC AB 2-0 UR6 27 (SUTURE) IMPLANT
SUT VIC AB 3-0 MH 27 (SUTURE) IMPLANT
SUT VIC AB 3-0 SH 27 (SUTURE)
SUT VIC AB 3-0 SH 27X BRD (SUTURE) IMPLANT
SUT VIC AB 3-0 X1 27 (SUTURE) ×5 IMPLANT
SYR 20ML ECCENTRIC (SYRINGE) ×3 IMPLANT
SYR 5ML LUER SLIP (SYRINGE) ×2 IMPLANT
SYR CONTROL 10ML LL (SYRINGE) IMPLANT
SYSTEM SAHARA CHEST DRAIN ATS (WOUND CARE) ×3 IMPLANT
TAPE PAPER 3X10 WHT MICROPORE (GAUZE/BANDAGES/DRESSINGS) ×1 IMPLANT
TAPE UMBILICAL 1/8 X36 TWILL (MISCELLANEOUS) IMPLANT
TIP APPLICATOR SPRAY EXTEND 16 (VASCULAR PRODUCTS) IMPLANT
TOWEL OR 17X24 6PK STRL BLUE (TOWEL DISPOSABLE) ×6 IMPLANT
TOWEL OR 17X26 10 PK STRL BLUE (TOWEL DISPOSABLE) ×3 IMPLANT
TRAP SPECIMEN MUCOUS 40CC (MISCELLANEOUS) ×2 IMPLANT
TRAY FOLEY CATH 16FRSI W/METER (SET/KITS/TRAYS/PACK) ×3 IMPLANT
TUBE CONNECTING 12X1/4 (SUCTIONS) ×3 IMPLANT
TUNNELER SHEATH ON-Q 11GX8 (MISCELLANEOUS) ×1 IMPLANT
WATER STERILE IRR 1000ML POUR (IV SOLUTION) ×7 IMPLANT

## 2013-03-02 NOTE — Brief Op Note (Signed)
02/27/2013 - 03/02/2013  10:49 AM  PATIENT:  Teresa [name]  62 y.o. female  PRE-OPERATIVE DIAGNOSIS:   RUL LUNG MASS   POST-OPERATIVE DIAGNOSIS:   RUL LUNG MASS   PROCEDURE:  Procedure(s) with comments: FLEXIBLE BRONCHOSCOPY (N/A) Right Video Assisted Thoracoscopy/Thoracotomy with upper Lobectomy (Right) - Right Lung Upper  Lobectomy   SURGEON:  Surgeon(s) and Role:    * Alleen Borne, MD - Primary  PHYSICIAN ASSISTANT: Erin Barrett, PA-C   ANESTHESIA:   general  EBL:  Total I/O In: -  Out: 150 [Urine:50; Blood:100]  BLOOD ADMINISTERED:none  DRAINS: 1 10F chest tube and 1 16F blake drain   LOCAL MEDICATIONS USED:  NONE  SPECIMEN:  Source of Specimen:  right upper lobe  DISPOSITION OF SPECIMEN:  PATHOLOGY  COUNTS:  YES   DICTATION: .Note written in EPIC  PLAN OF CARE: Admit to inpatient   PATIENT DISPOSITION:  PACU - hemodynamically stable.   Delay start of Pharmacological VTE agent (>24hrs) due to surgical blood loss or risk of bleeding: yes

## 2013-03-02 NOTE — Progress Notes (Signed)
She had a stable weekend on Integrelin with no chest pain or shortness of breath. Plan bronchoscopy followed by right VATS/Thoracotomy and probable right upper lobectomy.

## 2013-03-02 NOTE — Progress Notes (Signed)
SUBJECTIVE:  No pain. Successful surgery today.  OBJECTIVE:   Vitals:   Filed Vitals:   03/02/13 1530 03/02/13 1600 03/02/13 1603 03/02/13 1700  BP: 113/52 120/56  110/53  Pulse: 61 68  64  Temp:   97.4 F (36.3 C)   TempSrc:   Oral   Resp: 11 12  9   Height:      Weight:      SpO2: 99% 98%  100%   I&O's:   Intake/Output Summary (Last 24 hours) at 03/02/13 1800 Last data filed at 03/02/13 1700  Gross per 24 hour  Intake   1400 ml  Output    590 ml  Net    810 ml   TELEMETRY: Reviewed telemetry pt in NSR:     PHYSICAL EXAM General: Well developed, well nourished, in no acute distress Head: Normal cephalic and atramatic  Lungs: no wheezing Heart:   HRRR   Neuro: Alert and oriented X 3. Psych:  Good affect, responds appropriately   LABS: Basic Metabolic Panel:  Recent Labs  16/10/96 1350  NA 137  K 3.8  CL 102  CO2 25  GLUCOSE 127*  BUN 17  CREATININE 0.70  CALCIUM 8.8   Liver Function Tests:  Recent Labs  03/01/13 1350  AST 14  ALT 13  ALKPHOS 70  BILITOT 0.2*  PROT 7.0  ALBUMIN 3.4*   No results found for this basename: LIPASE, AMYLASE,  in the last 72 hours CBC:  Recent Labs  03/01/13 1350 03/02/13 0455  WBC 5.6 7.0  HGB 11.7* 12.3  HCT 35.7* 37.4  MCV 89.5 89.0  PLT 261 302   Cardiac Enzymes: No results found for this basename: CKTOTAL, CKMB, CKMBINDEX, TROPONINI,  in the last 72 hours BNP: No components found with this basename: POCBNP,  D-Dimer: No results found for this basename: DDIMER,  in the last 72 hours Hemoglobin A1C: No results found for this basename: HGBA1C,  in the last 72 hours Fasting Lipid Panel: No results found for this basename: CHOL, HDL, LDLCALC, TRIG, CHOLHDL, LDLDIRECT,  in the last 72 hours Thyroid Function Tests: No results found for this basename: TSH, T4TOTAL, FREET3, T3FREE, THYROIDAB,  in the last 72 hours Anemia Panel: No results found for this basename: VITAMINB12, FOLATE, FERRITIN, TIBC, IRON,  RETICCTPCT,  in the last 72 hours Coag Panel:   Lab Results  Component Value Date   INR 0.99 03/01/2013   INR 0.92 02/27/2013   INR 1.0 12/05/2012    RADIOLOGY: Dg Chest 2 View  03/02/2013   CLINICAL DATA:  Preoperative right lung lobectomy.  EXAM: CHEST  2 VIEW  COMPARISON:  02/04/2013  FINDINGS: Shallow inspiration. Normal heart size and pulmonary vascularity. Coronary artery calcification or stents. Increased density in the right upper lung corresponds to nodularity and spiculation demonstrated on CT but is partially obscured by overlying EKG leads. No acute consolidation or airspace disease. No blunting of costophrenic angles. No pneumothorax.  IMPRESSION: Vague nodular infiltration in the right upper lung consistent with known parenchymal lesion. No evidence of active pulmonary disease otherwise.   Electronically Signed   By: Burman Nieves M.D.   On: 03/02/2013 00:16   Dg Chest 2 View  02/04/2013   CLINICAL DATA:  Followup right upper lobe lung lesion  EXAM: CHEST  2 VIEW  COMPARISON:  CT chest of 12/10/2012 and chest x-ray of 12/03/2012  FINDINGS: The CT demonstrated nodule in the right upper lobe is not as well seen, with vague opacity  remaining at that site by chest x-ray. No obvious enlargement of the right upper lobe lung nodule is evident. No new lung nodule is seen. No pleural effusion is noted. Mediastinal contours appear stable, and the heart remains mildly enlarged. No bony abnormality is seen.  IMPRESSION: The right upper lobe lung nodule is not as well seen by chest x-ray but no enlargement of the nodule is evident by chest x-ray. No active lung disease.   Electronically Signed   By: Dwyane Dee M.D.   On: 02/04/2013 10:45   Dg Chest Portable 1 View  03/02/2013   CLINICAL DATA:  Postoperative evaluation  EXAM: PORTABLE CHEST - 1 VIEW  COMPARISON:  03/01/2013  FINDINGS: Multiple coronary stents are again identified. The cardiac shadow is stable. A 2 right-sided chest tubes are  noted. No residual pneumothorax is seen. A right-sided central venous line is noted with the tip at the cavoatrial junction. The lungs are clear.  IMPRESSION: Tubes and lines as described.  No pneumothorax.   Electronically Signed   By: Alcide Clever M.D.   On: 03/02/2013 12:00      ASSESSMENT: CAD s/p multiple stents  PLAN:  Need to restart antiplatelet therapy at some point.  Could start clopidogrel when safe from a bleeding standpoint.  Based dose on bleeding risk.  Could start at 75 mg daily, but it would take 5 days to become therapeutic. Higher initial dose for faster onset of platelet inhibition.    Corky Crafts., MD  03/02/2013  6:00 PM

## 2013-03-02 NOTE — Transfer of Care (Signed)
Immediate Anesthesia Transfer of Care Note  Patient: Teresa [name]  Procedure(s) Performed: Procedure(s) with comments: FLEXIBLE BRONCHOSCOPY (N/A) Right Video Assisted Thoracoscopy/Thoracotomy with upper Lobectomy (Right) - Right Lung Upper  Lobectomy   Patient Location: PACU  Anesthesia Type:General  Level of Consciousness: awake, alert , oriented and patient cooperative  Airway & Oxygen Therapy: Patient Spontanous Breathing and Patient connected to face mask oxygen  Post-op Assessment: Report given to PACU RN, Post -op Vital signs reviewed and stable and Patient moving all extremities X 4  Post vital signs: Reviewed and stable  Complications: No apparent anesthesia complications

## 2013-03-02 NOTE — Anesthesia Preprocedure Evaluation (Addendum)
Anesthesia Evaluation  Patient identified by MRN, date of birth, ID band Patient awake    Reviewed: Allergy & Precautions, H&P , NPO status , Patient's Chart, lab work & pertinent test results  Airway Mallampati: I TM Distance: >3 FB Neck ROM: Full    Dental  (+) Teeth Intact and Dental Advisory Given   Pulmonary shortness of breath, COPDformer smoker,  breath sounds clear to auscultation        Cardiovascular + angina + CAD, + Past MI, + Cardiac Stents and + Peripheral Vascular Disease Rhythm:Regular Rate:Normal     Neuro/Psych    GI/Hepatic GERD-  ,  Endo/Other    Renal/GU      Musculoskeletal   Abdominal   Peds  Hematology   Anesthesia Other Findings   Reproductive/Obstetrics                          Anesthesia Physical Anesthesia Plan  ASA: III  Anesthesia Plan: General   Post-op Pain Management:    Induction: Intravenous  Airway Management Planned: Double Lumen EBT  Additional Equipment: Arterial line, CVP and Ultrasound Guidance Line Placement  Intra-op Plan:   Post-operative Plan: Extubation in OR  Informed Consent: I have reviewed the patients History and Physical, chart, labs and discussed the procedure including the risks, benefits and alternatives for the proposed anesthesia with the patient or authorized representative who has indicated his/her understanding and acceptance.   Dental advisory given  Plan Discussed with: CRNA, Anesthesiologist and Surgeon  Anesthesia Plan Comments:         Anesthesia Quick Evaluation

## 2013-03-02 NOTE — Progress Notes (Signed)
Patient ID: Teresa [name], female   DOB: 01-07-1951, 62 y.o.   MRN: [id number]   SICU Evening Rounds:   Hemodynamically stable   Up in chair. Says she has no pain. Has not had fentanyl yet.   Urine output good  CT output low. No airleak  CBC    Component Value Date/Time   WBC 7.0 03/02/2013 0455   RBC 4.20 03/02/2013 0455   HGB 12.3 03/02/2013 0455   HCT 37.4 03/02/2013 0455   PLT 302 03/02/2013 0455   MCV 89.0 03/02/2013 0455   MCH 29.3 03/02/2013 0455   MCHC 32.9 03/02/2013 0455   RDW 13.3 03/02/2013 0455   LYMPHSABS 1.5 12/05/2012 1144   MONOABS 0.5 12/05/2012 1144   EOSABS 0.2 12/05/2012 1144   BASOSABS 0.0 12/05/2012 1144     BMET    Component Value Date/Time   NA 137 03/01/2013 1350   K 3.8 03/01/2013 1350   CL 102 03/01/2013 1350   CO2 25 03/01/2013 1350   GLUCOSE 127* 03/01/2013 1350   BUN 17 03/01/2013 1350   CREATININE 0.70 03/01/2013 1350   CALCIUM 8.8 03/01/2013 1350   GFRNONAA >90 03/01/2013 1350   GFRAA >90 03/01/2013 1350     A/P:  Stable postop course. Continue current plans

## 2013-03-02 NOTE — Op Note (Signed)
CARDIOTHORACIC SURGERY OPERATIVE NOTE 03/02/2013 Teresa [name] [id number]  Surgeon:  Alleen Borne, MD  First Assistant: Lowella Dandy, PA-C   Preoperative Diagnosis:  Right upper lobe lung mass  Postoperative Diagnosis:  Same   Procedure:  1.  Flexible video bronchoscopy 2.  Right muscle-sparing thoracotomy 3.  Right upper lobectomy 4.  Mediastinal lymph node dissection  Anesthesia:  General Endotracheal   Clinical History/Surgical Indication:  The patient is a 62 year old smoker with moderate to severe air flow obstruction felt secondary to emphysema and ongoing airway inflammation related to smoking who has been followed by pulmonary medicine since June 2013. She had a right upper lobe nodular density noted on chest x-ray that has been followed with serial CT scans. She was admitted in April 2014 after a dog bite in her right arm causing cellulitis and a followup CT scan showed the right upper lobe nodular density to be slightly larger than on her scan last year. She also had groundglass opacities noted in the posterior aspect of the right upper lobe and patchy densities bilaterally of unknown etiology. She had some recent increase cough and shortness of breath starting about 2 weeks prior. She was discharged and had a outpatient PET scan which showed the irregular right apical density to have a maximum SUV of 4.3 concerning for bronchogenic carcinoma. There were no other areas of hypermetabolism noted on the scan. She had pulmonary function testing done around this time which showed an FEV1 of 0.96 which was 51% predicted with no improvement with bronchodilator. Diffusion capacity was 78%. She said that at the time the pulmonary function tests were done she was having significant pulmonary symptoms that she felt were an exacerbation of her emphysema. She continued to smoke about 5 cigarettes per day but says here breathing is back to baseline. I repeated her PFT's and they were  significantly better with an FEV1 of 1.32 and a diffusion capacity of 93%. She quit smoking. She was having some chest discomfort and was evaluated by Dr. Eldridge Dace. She had a complicated LAD PCI with dissection extending back into the left main requiring stenting of the LM, LAD and LCX. She was treated with DAPT and on further followup reported recurrent chest pain. She had PCI of a RCA lesion that was complicated by dissection requiring 3 stents on 12/08/2012. She has remained on aspirin and plavix. She reports one episode of chest discomfort recently while walking while carrying a laptop in one hand and clothes in another. She said it felt just like the pain she had before her PCI across the upper chest and into both upper arms. She was seen by Dr. Eldridge Dace and started on Imdur but developed a severe headache and stopped it on Thanksgiving day. She has had no recurrent chest pain with normal activity. She says she feels great since stopping the Imdur. She has had no cough or shortness of breath. She has seen Dr. Eldridge Dace who feels she is stable to undergo lung resection without further workup. I told her I would be ok with that as long as she has no further chest pain with normal activity. I reviewed the surgery again with the patient and her son including alternatives, benefits and risks including but not limited to bleeding, blood transfusion, infection, prolonged air leak, pleural space problems, respiratory failure, chronic dyspnea, cardiac complications, and recurrent cancer. I also discussed the possibility that this is not a cancer, although I think that is unlikely given its appearance and hypermetabolic activity  on PET scan. Her Plavix was stopped and she was admitted 2 days later for an Integrelin bridge prior to surgery due to the extensive coronary stenting.     Preparation:  The patient was seen in the preoperative holding area and the correct patient, correct operation were confirmed with the  patient after reviewing the medical record and xrays. The right side of the chest was signed by me. The consent was signed by me. Preoperative antibiotics were given. A radial arterial line was placed by the anesthesia team. The patient was taken back to the operating room and positioned supine on the operating room table. After being placed under general endotracheal anesthesia by the anesthesia team using a single lumen tube a foley catheter was placed. Lower extremity SCD's were placed. A time out was taken and the correct patient, operation and operative side were confirmed with nursing and anesthesia staff. Flexible bronchoscopy was performed as noted below. Then the single lumen tube was converted to a double lumen tube and the patient was turned into the left lateral decubitus position with the right side up. The chest was prepped with betadine soap and solution and draped in the usual sterile manner. A surgical time-out was taken and the correct patient and operative procedure and operative side were confirmed with the nursing and anesthesia staff.   Flexible Bronchoscopy:  The video bronchoscope was passed down the endotracheal tube. The distal trachea was normal. The carina was sharp. The left bronchial tree had normal segm bronchial tree had normal segmental anatomy with no endobronchial lesions or extrinsic compression. The right bronchial tree had normal segmental anatomy with no endobronchial lesions or extrinsic compression.   Right video-assisted thoracoscopy:  A 1 cm incision was made in the mid-axillary line at the 8th ICS. An 8 mm trocar was inserted in to the pleural space and a 0-degree 5 mm thoracoscope was used. The apex of the right lung was adherent to the chest was with adhesions. Another 1 cm incision was made at the posterior axillary line in the 5th ICS and a cautery scissors inserted. The adhesions were divided. There was a palpable mass present beneath these adhesions with  umbilication of the visceral pleura over it. I did not feel that this could be safely removed with a wedge resection due to the size because I would have to remove half of the lobe to get around the lesion. I felt that this was most likely a lung cancer and proceeded with right upper lobectomy.  Right muscle-sparing thoracotomy:  The chest was entered through a lateral muscle-sparing thoracotomy incision. The pleural space was entered through the 4th ICS. The pleural space was unremarkable. The upper lobe was firm and measured several centimeters. The fissures were both incomplete. There were firm palpable lymph nodes in the hilum. The mediastinal pleura was opened over the hilum. The right phrenic nerve was identified and avoided. The right upper lobe pulmonary artery branches were encircled with tapes and divided using vascular staplers. The upper lobe pulmonary vein was encircled with a tape and divided using a vascular stapler. The middle lobe branch of the right upper pulmonary vein was preserved. The fissure was opened and the interlobar pulmonary artery identified. The posterior ascending branch was divided using a vascular stapler. The middle lobe pulmonary artery was preserved. The minor fissure was divided with several firings of a 60mm thick black stapler.  The incomplete fissure between the upper and lower lobe posteriorly was divided using a 60 black  thick stapler. A TA-30 stapler with 4.8 mm staples was passed around the right upper lobe bronchus and closed. The right lung was inflated and the middle and lower lobes fully expanded. The stapler was fired and the bronchial stump transected distal to the stapler. The specimen was passed off the table and sent to pathology. Frozen section was not requested because it would not change anything that I did. The chest was filled with saline and the bronchial stump tested at 30 cm pressure. There was no air leak. The raw lung surface in the fissure was  coated with Coseal. The subcarinal space was opened and no lymph nodes were seen. The mediastinal pleura was opened along the trachea and no paratracheal lymph nodes were seen. Hemostasis was complete.  Completion:  An On-Q pain catheter was placed through a stab incision in the chest wall and advanced in a subpleural location from the 5th ICS to the 3th ICS posteriorly. A 32 F Blake drain and a 28 F chest tube were placed in the pleural space posteriorly and anteriorly. The ribs were reapproximated with # 2 vicryl pericostal sutures with the sutures placed through holes drilled in the 5th rib and placed around the 4th rib. The muscles were returned to their normal anatomic position. The subcutaneous tissue was closed with 2-0 vicryl suture and the skin with 3-0 vicryl subcuticular suture. The sponge, needle, and instrument counts were correct according to the nurses. Dry sterile dressings were applied and the chest tubes were connected to pleurevac suction. The patient was turned into the supine position, extubated, and transferred to the PACU in satisfactory and stable condition.

## 2013-03-02 NOTE — OR Nursing (Signed)
Procedure Times : Flexible bronchoscopy- 750-754      Right VATS/Thoracotomy : 8:28-

## 2013-03-02 NOTE — Anesthesia Postprocedure Evaluation (Signed)
Anesthesia Post Note  Patient: Teresa [name]  Procedure(s) Performed: Procedure(s) (LRB): FLEXIBLE BRONCHOSCOPY (N/A) Right Video Assisted Thoracoscopy/Thoracotomy with upper Lobectomy (Right)  Anesthesia type: General  Patient location: PACU  Post pain: Pain level controlled and Adequate analgesia  Post assessment: Post-op Vital signs reviewed, Patient's Cardiovascular Status Stable, Respiratory Function Stable, Patent Airway and Pain level controlled  Last Vitals:  Filed Vitals:   03/02/13 1313  BP: 108/47  Pulse: 64  Temp:   Resp: 10    Post vital signs: Reviewed and stable  Level of consciousness: awake, alert  and oriented  Complications: No apparent anesthesia complications

## 2013-03-03 ENCOUNTER — Encounter (HOSPITAL_COMMUNITY): Payer: Self-pay | Admitting: Surgery

## 2013-03-03 ENCOUNTER — Inpatient Hospital Stay (HOSPITAL_COMMUNITY): Payer: BC Managed Care – PPO

## 2013-03-03 LAB — CBC
HCT: 32.3 % — ABNORMAL LOW (ref 36.0–46.0)
Hemoglobin: 10.3 g/dL — ABNORMAL LOW (ref 12.0–15.0)
MCH: 28.9 pg (ref 26.0–34.0)
MCHC: 31.9 g/dL (ref 30.0–36.0)
WBC: 7.4 10*3/uL (ref 4.0–10.5)

## 2013-03-03 LAB — BLOOD GAS, ARTERIAL
Acid-Base Excess: 0.7 mmol/L (ref 0.0–2.0)
Bicarbonate: 25.5 mEq/L — ABNORMAL HIGH (ref 20.0–24.0)
O2 Saturation: 98.6 %
pO2, Arterial: 118 mmHg — ABNORMAL HIGH (ref 80.0–100.0)

## 2013-03-03 LAB — BASIC METABOLIC PANEL
BUN: 14 mg/dL (ref 6–23)
Chloride: 98 mEq/L (ref 96–112)
GFR calc Af Amer: >90 mL/min (ref >90)
Potassium: 4.6 mEq/L (ref 3.7–5.3)
Sodium: 133 mEq/L — ABNORMAL LOW (ref 137–147)

## 2013-03-03 LAB — GLUCOSE, CAPILLARY
Glucose-Capillary: 78 mg/dL (ref 70–99)
Glucose-Capillary: 96 mg/dL (ref 70–99)

## 2013-03-03 MED ORDER — ASPIRIN EC 81 MG PO TBEC
81.0000 mg | DELAYED_RELEASE_TABLET | Freq: Every day | ORAL | Status: DC
  Administered 2013-03-03 – 2013-03-05 (×3): 81 mg via ORAL
  Filled 2013-03-03 (×4): qty 1

## 2013-03-03 MED ORDER — LEVALBUTEROL HCL 0.63 MG/3ML IN NEBU: 0.6300 mg | INHALATION_SOLUTION | Freq: Four times a day (QID) | RESPIRATORY_TRACT | Status: DC | PRN

## 2013-03-03 MED ORDER — LEVALBUTEROL HCL 0.63 MG/3ML IN NEBU
0.6300 mg | INHALATION_SOLUTION | Freq: Three times a day (TID) | RESPIRATORY_TRACT | Status: DC
  Administered 2013-03-04 – 2013-03-05 (×6): 0.63 mg via RESPIRATORY_TRACT
  Filled 2013-03-03 (×13): qty 3

## 2013-03-03 MED ORDER — PANTOPRAZOLE SODIUM 40 MG PO TBEC
40.0000 mg | DELAYED_RELEASE_TABLET | Freq: Every day | ORAL | Status: DC
  Administered 2013-03-03 – 2013-03-05 (×3): 40 mg via ORAL
  Filled 2013-03-03 (×3): qty 1

## 2013-03-03 MED ORDER — CLOPIDOGREL BISULFATE 300 MG PO TABS
300.0000 mg | ORAL_TABLET | Freq: Once | ORAL | Status: AC
  Administered 2013-03-03: 300 mg via ORAL
  Filled 2013-03-03: qty 1

## 2013-03-03 MED ORDER — CLOPIDOGREL BISULFATE 75 MG PO TABS
75.0000 mg | ORAL_TABLET | Freq: Every day | ORAL | Status: DC
  Administered 2013-03-04 – 2013-03-05 (×2): 75 mg via ORAL
  Filled 2013-03-03 (×6): qty 1

## 2013-03-03 NOTE — Progress Notes (Signed)
SUBJECTIVE:  No pain. Successful surgery yesterday.  Starting Plavix today  OBJECTIVE:   Vitals:   Filed Vitals:   03/03/13 0812 03/03/13 0833 03/03/13 0900 03/03/13 0903  BP:   108/46   Pulse:  82 88 85  Temp: 98.1 F (36.7 C)     TempSrc: Axillary     Resp:  26 23 25   Height:      Weight:      SpO2:  96% 97% 95%   I&O's:    Intake/Output Summary (Last 24 hours) at 03/03/13 5784 Last data filed at 03/03/13 0900  Gross per 24 hour  Intake 2964.5 ml  Output   1285 ml  Net 1679.5 ml   TELEMETRY: Reviewed telemetry pt in NSR:     PHYSICAL EXAM General: Well developed, well nourished, in no acute distress Head: Normal cephalic and atramatic  Lungs: no wheezing Heart:   HRRR   Neuro: Alert and oriented X 3. Psych:  Good affect, responds appropriately   LABS: Basic Metabolic Panel:  Recent Labs  69/62/95 1350 03/03/13 0400  NA 137 133*  K 3.8 4.6  CL 102 98  CO2 25 26  GLUCOSE 127* 106*  BUN 17 14  CREATININE 0.70 0.55  CALCIUM 8.8 8.0*   Liver Function Tests:  Recent Labs  03/01/13 1350  AST 14  ALT 13  ALKPHOS 70  BILITOT 0.2*  PROT 7.0  ALBUMIN 3.4*   No results found for this basename: LIPASE, AMYLASE,  in the last 72 hours CBC:  Recent Labs  03/02/13 0455 03/03/13 0400  WBC 7.0 7.4  HGB 12.3 10.3*  HCT 37.4 32.3*  MCV 89.0 90.5  PLT 302 239   Cardiac Enzymes: No results found for this basename: CKTOTAL, CKMB, CKMBINDEX, TROPONINI,  in the last 72 hours BNP: No components found with this basename: POCBNP,  D-Dimer: No results found for this basename: DDIMER,  in the last 72 hours Hemoglobin A1C: No results found for this basename: HGBA1C,  in the last 72 hours Fasting Lipid Panel: No results found for this basename: CHOL, HDL, LDLCALC, TRIG, CHOLHDL, LDLDIRECT,  in the last 72 hours Thyroid Function Tests: No results found for this basename: TSH, T4TOTAL, FREET3, T3FREE, THYROIDAB,  in the last 72 hours Anemia Panel: No  results found for this basename: VITAMINB12, FOLATE, FERRITIN, TIBC, IRON, RETICCTPCT,  in the last 72 hours Coag Panel:   Lab Results  Component Value Date   INR 0.99 03/01/2013   INR 0.92 02/27/2013   INR 1.0 12/05/2012    RADIOLOGY: Dg Chest 2 View  03/02/2013   CLINICAL DATA:  Preoperative right lung lobectomy.  EXAM: CHEST  2 VIEW  COMPARISON:  02/04/2013  FINDINGS: Shallow inspiration. Normal heart size and pulmonary vascularity. Coronary artery calcification or stents. Increased density in the right upper lung corresponds to nodularity and spiculation demonstrated on CT but is partially obscured by overlying EKG leads. No acute consolidation or airspace disease. No blunting of costophrenic angles. No pneumothorax.  IMPRESSION: Vague nodular infiltration in the right upper lung consistent with known parenchymal lesion. No evidence of active pulmonary disease otherwise.   Electronically Signed   By: Burman Nieves M.D.   On: 03/02/2013 00:16   Dg Chest 2 View  02/04/2013   CLINICAL DATA:  Followup right upper lobe lung lesion  EXAM: CHEST  2 VIEW  COMPARISON:  CT chest of 12/10/2012 and chest x-ray of 12/03/2012  FINDINGS: The CT demonstrated nodule in the right upper  lobe is not as well seen, with vague opacity remaining at that site by chest x-ray. No obvious enlargement of the right upper lobe lung nodule is evident. No new lung nodule is seen. No pleural effusion is noted. Mediastinal contours appear stable, and the heart remains mildly enlarged. No bony abnormality is seen.  IMPRESSION: The right upper lobe lung nodule is not as well seen by chest x-ray but no enlargement of the nodule is evident by chest x-ray. No active lung disease.   Electronically Signed   By: Dwyane Dee M.D.   On: 02/04/2013 10:45   Dg Chest Portable 1 View  03/02/2013   CLINICAL DATA:  Postoperative evaluation  EXAM: PORTABLE CHEST - 1 VIEW  COMPARISON:  03/01/2013  FINDINGS: Multiple coronary stents are again  identified. The cardiac shadow is stable. A 2 right-sided chest tubes are noted. No residual pneumothorax is seen. A right-sided central venous line is noted with the tip at the cavoatrial junction. The lungs are clear.  IMPRESSION: Tubes and lines as described.  No pneumothorax.   Electronically Signed   By: Alcide Clever M.D.   On: 03/02/2013 12:00      ASSESSMENT: CAD s/p multiple stents  PLAN:  Per Dr. Laneta Simmers: Plavix 300 mg x1 , then 75 mg daily.  Also ASA 81 mg daily. Corky Crafts., MD  03/03/2013  9:18 AM

## 2013-03-03 NOTE — Progress Notes (Signed)
POD # 1 Right upper lobectomy  Minimal pain, "I'm doing wonderful"  BP 116/48  Pulse 95  Temp(Src) 98.5 F (36.9 C) (Oral)  Resp 22  Ht 4\' 11"  (1.499 m)  Wt 148 lb 2.4 oz (67.2 kg)  BMI 29.91 kg/m2  SpO2 96%  LMP 10/04/1999   Intake/Output Summary (Last 24 hours) at 03/03/13 1812 Last data filed at 03/03/13 1600  Gross per 24 hour  Intake 2444.5 ml  Output   1590 ml  Net  854.5 ml    Doing well  Continue current care

## 2013-03-03 NOTE — Progress Notes (Addendum)
[facility] DAILY ICU PROGRESS NOTE                   [address]            [name] [address]          [phone]   1 Day Post-Op Procedure(s) (LRB): FLEXIBLE BRONCHOSCOPY (N/A) Right Video Assisted Thoracoscopy/Thoracotomy with upper Lobectomy (Right)  Total Length of Stay:  LOS: 4 days   Subjective: Patient awake and alert this am. No specific complaints.  Objective: Vital signs in last 24 hours: Temp:  [97.4 F (36.3 C)-98.5 F (36.9 C)] 98.5 F (36.9 C) (12/30 0400) Pulse Rate:  [57-98] 77 (12/30 0700) Cardiac Rhythm:  [-] Normal sinus rhythm (12/30 0600) Resp:  [9-25] 23 (12/30 0700) BP: (88-125)/(28-95) 102/51 mmHg (12/30 0700) SpO2:  [91 %-100 %] 95 % (12/30 0700) Arterial Line BP: (85-144)/(49-93) 116/62 mmHg (12/30 0700) FiO2 (%):  [98 %] 98 % (12/29 1600) Weight:  [67.2 kg (148 lb 2.4 oz)] 67.2 kg (148 lb 2.4 oz) (12/30 0600)  Filed Weights   02/28/13 0400 03/01/13 0410 03/03/13 0600  Weight: 64.32 kg (141 lb 12.8 oz) 64.9 kg (143 lb 1.3 oz) 67.2 kg (148 lb 2.4 oz)      Intake/Output from previous day: 12/29 0701 - 12/30 0700 In: 2814.5 [P.O.:360; I.V.:2454.5] Out: 1255 [Urine:735; Blood:100; Chest Tube:420]  Intake/Output this shift:    Current Meds: Scheduled Meds: . acetaminophen  1,000 mg Oral Q6H   Or  . acetaminophen (TYLENOL) oral liquid 160 mg/5 mL  1,000 mg Oral Q6H  . atorvastatin  20 mg Oral 3 times weekly  . bisacodyl  10 mg Oral Daily  . fentaNYL   Intravenous Q4H  . insulin aspart  0-24 Units Subcutaneous Q4H  . levalbuterol  0.63 mg Nebulization Q6H   Continuous Infusions: . dextrose 5 % and 0.45 % NaCl with KCl 20 mEq/L 75 mL/hr at 03/03/13 0700   PRN Meds:.diphenhydrAMINE, diphenhydrAMINE, ketorolac, naloxone, ondansetron (ZOFRAN) IV, ondansetron (ZOFRAN) IV, potassium chloride, senna-docusate, sodium chloride  General appearance: alert, cooperative and no distress Neurologic: intact Heart: regular rate and  rhythm, systolic murmur Lungs: Slightly diminished at right base, left lung is clear Extremities: No cyanosis, clubbing, or edema Wound: Dressing is clean and dry Chest tubes: to suction and no air leak  Lab Results: CBC: Recent Labs  03/02/13 0455 03/03/13 0400  WBC 7.0 7.4  HGB 12.3 10.3*  HCT 37.4 32.3*  PLT 302 239   BMET:  Recent Labs  03/01/13 1350 03/03/13 0400  NA 137 133*  K 3.8 4.6  CL 102 98  CO2 25 26  GLUCOSE 127* 106*  BUN 17 14  CREATININE 0.70 0.55  CALCIUM 8.8 8.0*    PT/INR:  Recent Labs  03/01/13 1350  LABPROT 12.9  INR 0.99   Radiology: Dg Chest 2 View  03/02/2013   CLINICAL DATA:  Preoperative right lung lobectomy.  EXAM: CHEST  2 VIEW  COMPARISON:  02/04/2013  FINDINGS: Shallow inspiration. Normal heart size and pulmonary vascularity. Coronary artery calcification or stents. Increased density in the right upper lung corresponds to nodularity and spiculation demonstrated on CT but is partially obscured by overlying EKG leads. No acute consolidation or airspace disease. No blunting of costophrenic angles. No pneumothorax.  IMPRESSION: Vague nodular infiltration in the right upper lung consistent with known parenchymal lesion. No evidence of active pulmonary disease otherwise.   Electronically Signed   By: Marisa Cyphers.D.  On: 03/02/2013 00:16   Dg Chest Portable 1 View  03/02/2013   CLINICAL DATA:  Postoperative evaluation  EXAM: PORTABLE CHEST - 1 VIEW  COMPARISON:  03/01/2013  FINDINGS: Multiple coronary stents are again identified. The cardiac shadow is stable. A 2 right-sided chest tubes are noted. No residual pneumothorax is seen. A right-sided central venous line is noted with the tip at the cavoatrial junction. The lungs are clear.  IMPRESSION: Tubes and lines as described.  No pneumothorax.   Electronically Signed   By: Alcide Clever M.D.   On: 03/02/2013 12:00     Assessment/Plan: S/P Procedure(s) (LRB): FLEXIBLE BRONCHOSCOPY  (N/A) Right Video Assisted Thoracoscopy/Thoracotomy with upper Lobectomy (Right)  1.CV-SR. Will load with Plavix 300 today and then resume 75 daily. Also, restart enteric coated aspirin 81 daily. 2.Pulmonary-Chest tubes with 420 cc since surgery. No air leak.Place to water seal.CXR this am appears to show patient is rotated to the left, no pneumothorax, some atelectasis at bases. Pathology results pending. 3. Remove a line 4.ABL anemia- H and H 10.3 and 32.3 5.Decrease IVF after lunch if tolerating po 6. Foley to remain until am   Elenore Rota 03/03/2013 7:46 AM  Chart reviewed, patient examined, agree with above. She is doing well. Plan as noted above. She has multiple stents so will load with Plavix this am. I think her bleeding risk is much lower than her risk of stent thrombosis at this point.

## 2013-03-04 ENCOUNTER — Encounter (HOSPITAL_COMMUNITY): Payer: Self-pay | Admitting: Interventional Cardiology

## 2013-03-04 ENCOUNTER — Inpatient Hospital Stay (HOSPITAL_COMMUNITY): Payer: BC Managed Care – PPO

## 2013-03-04 DIAGNOSIS — R000 Tachycardia, unspecified: Secondary | ICD-10-CM

## 2013-03-04 MED ORDER — SODIUM CHLORIDE 0.9 % IV SOLN
INTRAVENOUS | Status: DC
  Administered 2013-03-04: 10:00:00 via INTRAVENOUS

## 2013-03-04 NOTE — Progress Notes (Signed)
Patient ID: Teresa [name], female   DOB: 03-04-1951, 62 y.o.   MRN: [id number]  SICU Evening Rounds:  Hemodynamically stable  Feels well Ambulated well today. Pain under control.

## 2013-03-04 NOTE — Progress Notes (Addendum)
SUBJECTIVE:  No pain. Successful surgery yesterday.  Started Plavix yesterday  OBJECTIVE:   Vitals:   Filed Vitals:   03/04/13 1500 03/04/13 1512 03/04/13 1513 03/04/13 1600  BP: 106/56   115/50  Pulse: 115  117 107  Temp:      TempSrc:      Resp: 23  14 24   Height:      Weight:      SpO2: 94% 95% 96% 95%   I&O's:    Intake/Output Summary (Last 24 hours) at 03/04/13 1634 Last data filed at 03/04/13 1500  Gross per 24 hour  Intake 1264.83 ml  Output   3825 ml  Net -2560.17 ml   TELEMETRY: Reviewed telemetry pt in NSR, sinus tach:     PHYSICAL EXAM General: Well developed, well nourished, in no acute distress Head: Normal cephalic and atramatic  Lungs: no wheezing Heart:   HRRR   Neuro: Alert and oriented X 3. Psych:  Good affect, responds appropriately   LABS: Basic Metabolic Panel:  Recent Labs  16/10/96 0400  NA 133*  K 4.6  CL 98  CO2 26  GLUCOSE 106*  BUN 14  CREATININE 0.55  CALCIUM 8.0*   Liver Function Tests: No results found for this basename: AST, ALT, ALKPHOS, BILITOT, PROT, ALBUMIN,  in the last 72 hours No results found for this basename: LIPASE, AMYLASE,  in the last 72 hours CBC:  Recent Labs  03/02/13 0455 03/03/13 0400  WBC 7.0 7.4  HGB 12.3 10.3*  HCT 37.4 32.3*  MCV 89.0 90.5  PLT 302 239   Cardiac Enzymes: No results found for this basename: CKTOTAL, CKMB, CKMBINDEX, TROPONINI,  in the last 72 hours BNP: No components found with this basename: POCBNP,  D-Dimer: No results found for this basename: DDIMER,  in the last 72 hours Hemoglobin A1C: No results found for this basename: HGBA1C,  in the last 72 hours Fasting Lipid Panel: No results found for this basename: CHOL, HDL, LDLCALC, TRIG, CHOLHDL, LDLDIRECT,  in the last 72 hours Thyroid Function Tests: No results found for this basename: TSH, T4TOTAL, FREET3, T3FREE, THYROIDAB,  in the last 72 hours Anemia Panel: No results found for this basename: VITAMINB12, FOLATE,  FERRITIN, TIBC, IRON, RETICCTPCT,  in the last 72 hours Coag Panel:   Lab Results  Component Value Date   INR 0.99 03/01/2013   INR 0.92 02/27/2013   INR 1.0 12/05/2012    RADIOLOGY: Dg Chest 2 View  03/02/2013   CLINICAL DATA:  Preoperative right lung lobectomy.  EXAM: CHEST  2 VIEW  COMPARISON:  02/04/2013  FINDINGS: Shallow inspiration. Normal heart size and pulmonary vascularity. Coronary artery calcification or stents. Increased density in the right upper lung corresponds to nodularity and spiculation demonstrated on CT but is partially obscured by overlying EKG leads. No acute consolidation or airspace disease. No blunting of costophrenic angles. No pneumothorax.  IMPRESSION: Vague nodular infiltration in the right upper lung consistent with known parenchymal lesion. No evidence of active pulmonary disease otherwise.   Electronically Signed   By: Burman Nieves M.D.   On: 03/02/2013 00:16   Dg Chest 2 View  02/04/2013   CLINICAL DATA:  Followup right upper lobe lung lesion  EXAM: CHEST  2 VIEW  COMPARISON:  CT chest of 12/10/2012 and chest x-ray of 12/03/2012  FINDINGS: The CT demonstrated nodule in the right upper lobe is not as well seen, with vague opacity remaining at that site by chest x-ray. No obvious enlargement  of the right upper lobe lung nodule is evident. No new lung nodule is seen. No pleural effusion is noted. Mediastinal contours appear stable, and the heart remains mildly enlarged. No bony abnormality is seen.  IMPRESSION: The right upper lobe lung nodule is not as well seen by chest x-ray but no enlargement of the nodule is evident by chest x-ray. No active lung disease.   Electronically Signed   By: Dwyane Dee M.D.   On: 02/04/2013 10:45   Dg Chest Portable 1 View  03/02/2013   CLINICAL DATA:  Postoperative evaluation  EXAM: PORTABLE CHEST - 1 VIEW  COMPARISON:  03/01/2013  FINDINGS: Multiple coronary stents are again identified. The cardiac shadow is stable. A 2  right-sided chest tubes are noted. No residual pneumothorax is seen. A right-sided central venous line is noted with the tip at the cavoatrial junction. The lungs are clear.  IMPRESSION: Tubes and lines as described.  No pneumothorax.   Electronically Signed   By: Alcide Clever M.D.   On: 03/02/2013 12:00      ASSESSMENT: CAD s/p multiple stents  PLAN:   Plavix  75 mg daily.  Also ASA 81 mg daily.  Hbg dropped at last check.  Will check again in AM and see if anemia may be contributing to tachycardia. Encourage PO intake.  She is ambulating without problems.  CBC BMet in AM. Corky Crafts., MD  03/04/2013  4:34 PM

## 2013-03-04 NOTE — Progress Notes (Signed)
[facility] DAILY ICU PROGRESS NOTE                   [address]            [name] [address]          [phone]   2 Days Post-Op Procedure(s) (LRB): FLEXIBLE BRONCHOSCOPY (N/A) Right Video Assisted Thoracoscopy/Thoracotomy with upper Lobectomy (Right)  Total Length of Stay:  LOS: 5 days   Subjective: Patient feels fairly well. Only complaint is a "coughing spell" earlier this am.  Objective: Vital signs in last 24 hours: Temp:  [97.7 F (36.5 C)-99.1 F (37.3 C)] 98.4 F (36.9 C) (12/31 0400) Pulse Rate:  [73-102] 88 (12/31 0600) Cardiac Rhythm:  [-] Normal sinus rhythm (12/31 0400) Resp:  [11-30] 21 (12/31 0600) BP: (88-139)/(38-93) 129/55 mmHg (12/31 0600) SpO2:  [93 %-97 %] 94 % (12/31 0600) Arterial Line BP: (131)/(86) 131/86 mmHg (12/30 0800) FiO2 (%):  [21 %] 21 % (12/30 1155) Weight:  [65.2 kg (143 lb 11.8 oz)] 65.2 kg (143 lb 11.8 oz) (12/31 0600)  Filed Weights   03/01/13 0410 03/03/13 0600 03/04/13 0600  Weight: 64.9 kg (143 lb 1.3 oz) 67.2 kg (148 lb 2.4 oz) 65.2 kg (143 lb 11.8 oz)      Intake/Output from previous day: 12/30 0701 - 12/31 0700 In: 1970.5 [P.O.:720; I.V.:1250.5] Out: 3725 [Urine:3455; Chest Tube:270]      Current Meds: Scheduled Meds: . aspirin EC  81 mg Oral Daily  . atorvastatin  20 mg Oral 3 times weekly  . bisacodyl  10 mg Oral Daily  . clopidogrel  75 mg Oral Q breakfast  . fentaNYL   Intravenous Q4H  . levalbuterol  0.63 mg Nebulization TID  . pantoprazole  40 mg Oral Daily   Continuous Infusions: . dextrose 5 % and 0.45 % NaCl with KCl 20 mEq/L 50 mL/hr at 03/03/13 2003   PRN Meds:.diphenhydrAMINE, diphenhydrAMINE, ketorolac, levalbuterol, naloxone, ondansetron (ZOFRAN) IV, ondansetron (ZOFRAN) IV, potassium chloride, senna-docusate, sodium chloride  General appearance: alert, cooperative and no distress Neurologic: intact Heart: regular rate and rhythm, systolic murmur Lungs: Slightly diminished at  right base, left lung is clear Extremities: No cyanosis, clubbing, or edema Wound: Dressing is clean and dry Chest tubes: to water seal, some mnior tidling with cough  Lab Results: CBC:  Recent Labs  03/02/13 0455 03/03/13 0400  WBC 7.0 7.4  HGB 12.3 10.3*  HCT 37.4 32.3*  PLT 302 239   BMET:   Recent Labs  03/01/13 1350 03/03/13 0400  NA 137 133*  K 3.8 4.6  CL 102 98  CO2 25 26  GLUCOSE 127* 106*  BUN 17 14  CREATININE 0.70 0.55  CALCIUM 8.8 8.0*    PT/INR:   Recent Labs  03/01/13 1350  LABPROT 12.9  INR 0.99   Radiology: Dg Chest Port 1 View  03/03/2013   CLINICAL DATA:  Postop from right upper lobectomy for right upper lobe mass.  EXAM: PORTABLE CHEST - 1 VIEW  COMPARISON:  03/02/2013  FINDINGS: Two right chest tubes remain in place as well as a right internal jugular central venous catheter. No pneumothorax identified. Surgical staples seen in right upper lung field. No evidence of pulmonary infiltrate or pleural effusion. Heart size and mediastinal contours are stable.  IMPRESSION: Stable postop chest. No evidence of pneumothorax or other acute findings.   Electronically Signed   By: Myles Rosenthal M.D.   On: 03/03/2013 08:38   Dg  Chest Portable 1 View  03/02/2013   CLINICAL DATA:  Postoperative evaluation  EXAM: PORTABLE CHEST - 1 VIEW  COMPARISON:  03/01/2013  FINDINGS: Multiple coronary stents are again identified. The cardiac shadow is stable. A 2 right-sided chest tubes are noted. No residual pneumothorax is seen. A right-sided central venous line is noted with the tip at the cavoatrial junction. The lungs are clear.  IMPRESSION: Tubes and lines as described.  No pneumothorax.   Electronically Signed   By: Alcide Clever M.D.   On: 03/02/2013 12:00     Assessment/Plan: S/P Procedure(s) (LRB): FLEXIBLE BRONCHOSCOPY (N/A) Right Video Assisted Thoracoscopy/Thoracotomy with upper Lobectomy (Right)  1.CV-SR. Loaded with Plavix 300 yesterday and will resume 75  daily (multiple stents). Also, restarted enteric coated aspirin 81 daily. 2.Pulmonary-Chest tubes with 270 cc since surgery. No air leak.CXR this am appears to show  no pneumothorax, some atelectasis at bases. Remove one chest tube.Pathology results pending. 3.ABL anemia- Last H and H 10.3 and 32.3 4. Remove foley 5.Transfer to 3300  Ardelle Balls PA-C 03/04/2013 7:48 AM

## 2013-03-05 ENCOUNTER — Inpatient Hospital Stay (HOSPITAL_COMMUNITY): Payer: BC Managed Care – PPO

## 2013-03-05 LAB — CBC
HCT: 32.1 % — ABNORMAL LOW (ref 36.0–46.0)
Hemoglobin: 10.2 g/dL — ABNORMAL LOW (ref 12.0–15.0)
MCH: 28.6 pg (ref 26.0–34.0)
MCHC: 31.8 g/dL (ref 30.0–36.0)
MCV: 89.9 fL (ref 78.0–100.0)
Platelets: 243 10*3/uL (ref 150–400)
RBC: 3.57 MIL/uL — ABNORMAL LOW (ref 3.87–5.11)
RDW: 13.5 % (ref 11.5–15.5)
WBC: 5 10*3/uL (ref 4.0–10.5)

## 2013-03-05 LAB — BASIC METABOLIC PANEL
BUN: 12 mg/dL (ref 6–23)
CO2: 29 mEq/L (ref 19–32)
Calcium: 8.5 mg/dL (ref 8.4–10.5)
Chloride: 101 mEq/L (ref 96–112)
Creatinine, Ser: 0.56 mg/dL (ref 0.50–1.10)
GFR calc Af Amer: >90 mL/min (ref >90)
GFR calc non Af Amer: >90 mL/min (ref >90)
Glucose, Bld: 93 mg/dL (ref 70–99)
Potassium: 4.1 mEq/L (ref 3.7–5.3)
Sodium: 140 mEq/L (ref 137–147)

## 2013-03-05 MED ORDER — ACETAMINOPHEN 325 MG PO TABS
650.0000 mg | ORAL_TABLET | ORAL | Status: DC | PRN
  Administered 2013-03-05: 650 mg via ORAL
  Filled 2013-03-05: qty 2

## 2013-03-05 NOTE — Progress Notes (Addendum)
3 Days Post-Op Procedure(s) (LRB): FLEXIBLE BRONCHOSCOPY (N/A) Right Video Assisted Thoracoscopy/Thoracotomy with upper Lobectomy (Right) Subjective: No complaints  Objective: Vital signs in last 24 hours: Temp:  [98 F (36.7 C)-98.6 F (37 C)] 98.6 F (37 C) (01/01 0817) Pulse Rate:  [66-117] 77 (01/01 0700) Cardiac Rhythm:  [-] Normal sinus rhythm (01/01 0730) Resp:  [11-28] 20 (01/01 0730) BP: (90-131)/(24-63) 110/41 mmHg (01/01 0700) SpO2:  [93 %-97 %] 96 % (01/01 0849) FiO2 (%):  [21 %] 21 % (12/31 1600) Weight:  [64.2 kg (141 lb 8.6 oz)] 64.2 kg (141 lb 8.6 oz) (01/01 0500)  Hemodynamic parameters for last 24 hours:    Intake/Output from previous day: 12/31 0701 - 01/01 0700 In: 726.8 [P.O.:240; I.V.:486.8] Out: 2245 [Urine:2075; Chest Tube:170] Intake/Output this shift: Total I/O In: 10.5 [I.V.:10.5] Out: -   General appearance: alert and cooperative Heart: regular rate and rhythm, S1, S2 normal, no murmur, click, rub or gallop Lungs: clear to auscultation bilaterally no air leak from chest tube  Lab Results:  Recent Labs  03/03/13 0400 03/05/13 0450  WBC 7.4 5.0  HGB 10.3* 10.2*  HCT 32.3* 32.1*  PLT 239 243   BMET:  Recent Labs  03/03/13 0400 03/05/13 0450  NA 133* 140  K 4.6 4.1  CL 98 101  CO2 26 29  GLUCOSE 106* 93  BUN 14 12  CREATININE 0.55 0.56  CALCIUM 8.0* 8.5    PT/INR: No results found for this basename: LABPROT, INR,  in the last 72 hours ABG    Component Value Date/Time   PHART 7.360 03/03/2013 0359   HCO3 25.5* 03/03/2013 0359   TCO2 26.9 03/03/2013 0359   O2SAT 98.6 03/03/2013 0359   CBG (last 3)   Recent Labs  03/02/13 1934 03/03/13 0002 03/03/13 0355  GLUCAP 142* 78 96   CXR: lungs clear, no ptx  Pathology:  T2, N0 poorly diff squamous cell carcinoma with focal involvement of the visceral pleura  Assessment/Plan: S/P Procedure(s) (LRB): FLEXIBLE BRONCHOSCOPY (N/A) Right Video Assisted  Thoracoscopy/Thoracotomy with upper Lobectomy (Right) She continues to do very well. Will remove the last chest tube and transfer to 2W. Continue ambulation and IS.  If her CXR looks ok in the morning she can go home. I will see her back in 2 weeks with a CXR and will remove the chest tube sutures at that time. I discussed the pathology results with her and her son.     LOS: 6 days    BARTLE,BRYAN K 03/05/2013

## 2013-03-05 NOTE — Progress Notes (Signed)
Pt ambulated around the unit several times with no assistance on RA. Pt is not complaining of any pain. Tolerated ambulation well with minimal discomfort. Will continue to monitor.

## 2013-03-05 NOTE — Sedation Documentation (Signed)
Wasted Fentanyl 23 ml 10 mcg/ml in the sink and witnessed by Nunzio Cory.

## 2013-03-05 NOTE — Progress Notes (Signed)
Transferred to 2W17 via wheelchair accompanied by son. Report given to receiving RN prior to coming to the unit. Transferred from wheelchair to chair did by patient independently. Receiving RN at bedside. No untoward event happened during transport.

## 2013-03-05 NOTE — Progress Notes (Signed)
Subjective:  No c/o SOB or chest pain.  Objective:  Vital Signs in the last 24 hours: BP 110/41  Pulse 77  Temp(Src) 98.6 F (37 C) (Oral)  Resp 20  Ht 4\' 11"  (1.499 m)  Wt 64.2 kg (141 lb 8.6 oz)  BMI 28.57 kg/m2  SpO2 96%  LMP 10/04/1999  Physical Exam: Pleasant WF in NAD Lungs: chest tube on right. Reduced BS that side, Cardiac:  Regular rhythm, normal S1 and S2, no S3 2/6 systolic murmur Abdomen:  Soft, nontender, no masses Extremities:  No edema present  Intake/Output from previous day: 12/31 0701 - 01/01 0700 In: 726.8 [P.O.:240; I.V.:486.8] Out: 2245 [Urine:2075; Chest Tube:170] Weight Filed Weights   03/03/13 0600 03/04/13 0600 03/05/13 0500  Weight: 67.2 kg (148 lb 2.4 oz) 65.2 kg (143 lb 11.8 oz) 64.2 kg (141 lb 8.6 oz)    Lab Results: Basic Metabolic Panel:  Recent Labs  03/03/13 0400 03/05/13 0450  NA 133* 140  K 4.6 4.1  CL 98 101  CO2 26 29  GLUCOSE 106* 93  BUN 14 12  CREATININE 0.55 0.56    CBC:  Recent Labs  03/03/13 0400 03/05/13 0450  WBC 7.4 5.0  HGB 10.3* 10.2*  HCT 32.3* 32.1*  MCV 90.5 89.9  PLT 239 243   PROTIME: Lab Results  Component Value Date   INR 0.99 03/01/2013   INR 0.92 02/27/2013   INR 1.0 12/05/2012    Telemetry: Sinus with some periods of sinus tachycardia  Assessment/Plan:  1. Stable post resection of lung cancer 2. Aortic stenosis 3. CAD with multiple stents  Rec:  Questions answered about path results. Back on Plavix.  Hopefully home in am.    W. Doristine Church  MD [facility]  03/05/2013, 9:39 AM

## 2013-03-06 ENCOUNTER — Telehealth: Payer: Self-pay | Admitting: *Deleted

## 2013-03-06 ENCOUNTER — Other Ambulatory Visit: Payer: Self-pay | Admitting: *Deleted

## 2013-03-06 ENCOUNTER — Inpatient Hospital Stay (HOSPITAL_COMMUNITY): Payer: BC Managed Care – PPO

## 2013-03-06 DIAGNOSIS — G8918 Other acute postprocedural pain: Secondary | ICD-10-CM

## 2013-03-06 MED ORDER — TRAMADOL HCL 50 MG PO TABS: 50.0000 mg | ORAL_TABLET | Freq: Four times a day (QID) | ORAL | Status: DC | PRN

## 2013-03-06 NOTE — Progress Notes (Addendum)
[address]       [place] [address]             [phone]      4 Days Post-Op  Procedure(s) (LRB): FLEXIBLE BRONCHOSCOPY (N/A) Right Video Assisted Thoracoscopy/Thoracotomy with upper Lobectomy (Right) Subjective: Feel good    Objective  Telemetry sinus rhythm/tach   Temp:  [96.6 F (35.9 C)-98.6 F (37 C)] 98.4 F (36.9 C) (01/02 0453) Pulse Rate:  [85-99] 94 (01/02 0453) Resp:  [17-22] 18 (01/02 0453) BP: (99-137)/(41-61) 104/54 mmHg (01/02 0453) SpO2:  [94 %-98 %] 98 % (01/02 0453)   Intake/Output Summary (Last 24 hours) at 03/06/13 0746 Last data filed at 03/05/13 1700  Gross per 24 hour  Intake    246 ml  Output    250 ml  Net     -4 ml       General appearance: alert, cooperative and no distress Heart: regular rate and rhythm Lungs: milodly dim in right base Abdomen: benign Extremities: warm well perfused Wound: incis healing well Heart addendum- 2/6 syst murmur Lab Results:  Recent Labs  03/05/13 0450  NA 140  K 4.1  CL 101  CO2 29  GLUCOSE 93  BUN 12  CREATININE 0.56  CALCIUM 8.5   No results found for this basename: AST, ALT, ALKPHOS, BILITOT, PROT, ALBUMIN,  in the last 72 hours No results found for this basename: LIPASE, AMYLASE,  in the last 72 hours  Recent Labs  03/05/13 0450  WBC 5.0  HGB 10.2*  HCT 32.1*  MCV 89.9  PLT 243   No results found for this basename: CKTOTAL, CKMB, TROPONINI,  in the last 72 hours No components found with this basename: POCBNP,  No results found for this basename: DDIMER,  in the last 72 hours No results found for this basename: HGBA1C,  in the last 72 hours No results found for this basename: CHOL, HDL, LDLCALC, TRIG, CHOLHDL,  in the last 72 hours No results found for this basename: TSH, T4TOTAL, FREET3, T3FREE, THYROIDAB,  in the last 72 hours No results found for this basename: VITAMINB12, FOLATE, FERRITIN, TIBC, IRON, RETICCTPCT,  in the last 72  hours  Medications: Scheduled . aspirin EC  81 mg Oral Daily  . atorvastatin  20 mg Oral 3 times weekly  . bisacodyl  10 mg Oral Daily  . clopidogrel  75 mg Oral Q breakfast  . levalbuterol  0.63 mg Nebulization TID  . pantoprazole  40 mg Oral Daily     Radiology/Studies:  Dg Chest 2 View  03/06/2013   CLINICAL DATA:  Post right upper lobectomy.  EXAM: CHEST  2 VIEW  COMPARISON:  03/05/2013  FINDINGS: Right-sided chest tube and right central line and been removed. Questionable tiny right lateral loculated pneumothorax versus result of overlying structures. Attention to this on follow up.  Rounded appearance of the lateral margin of the right paratracheal region is new from the prior examination. This appears atypical for that secondary to vascular silhouette and postsurgical changes such is hematoma not excluded.  Postsurgical changes right lung with elevated right hemidiaphragm.  Prominent vascular calcifications included in prominent coronary artery calcifications.  Cardiac silhouette top-normal.  IMPRESSION: Right-sided chest tube and right central line and been removed. Questionable tiny right lateral loculated pneumothorax versus result of overlying structures. Attention to this on follow up.  Rounded appearance of the lateral margin of the right paratracheal region is new from the prior examination.  This appears atypical for that secondary to vascular silhouette and postsurgical changes such is hematoma not excluded.  These results will be called to the ordering clinician or representative by the Radiologist Assistant, and communication documented in the PACS Dashboard.   Electronically Signed   By: Chauncey Cruel M.D.   On: 03/06/2013 07:38   Dg Chest Port 1 View  03/05/2013   CLINICAL DATA:  No chest pain, shortness of breath  EXAM: PORTABLE CHEST - 1 VIEW  COMPARISON:  03/04/2013  FINDINGS: There is a right-sided chest tube directed towards the apex without a pneumothorax. The 2nd right-sided  chest to has been removed. There is no significant pleural effusion. There is a right-sided jugular central venous catheter with the tip projecting at the cavoatrial junction. There is no focal consolidation. There is no left pleural effusion. There are postsurgical changes in the right lung from partial lobectomy. There is elevation of the right diaphragm. The heart and mediastinal contours are unremarkable.  The osseous structures are unremarkable.  IMPRESSION: Right-sided chest to directed towards the apex without a pneumothorax.   Electronically Signed   By: Kathreen Devoid   On: 03/05/2013 08:12    INR: Will add last result for INR, ABG once components are confirmed Will add last 4 CBG results once components are confirmed  Assessment/Plan: S/P Procedure(s) (LRB): FLEXIBLE BRONCHOSCOPY (N/A) Right Video Assisted Thoracoscopy/Thoracotomy with upper Lobectomy (Right) Plan for discharge: see discharge orders CXR reviewed with Dr Cyndia Bent She feels quite well  LOS: 7 days    GOLD,WAYNE E 1/2/20157:46 AM

## 2013-03-06 NOTE — Discharge Instructions (Signed)
Lung Resection Care After Refer to this sheet in the next few weeks. These instructions provide you with information on caring for yourself after your procedure. Your caregiver may also give you more specific instructions. Your treatment has been planned according to current medical practices, but problems sometimes occur. Call your caregiver if you have any problems or questions after your procedure. HOME CARE INSTRUCTIONS  You may resume a normal diet and activities as directed.  Do not smoke or use tobacco products.  Change your bandages (dressings) as directed.  Only take over-the-counter or prescription medicines for pain, discomfort, or fever as directed by your caregiver.  Keep all follow-up appointments as directed.  Try to breathe deeply and cough as directed. Holding a pillow firmly over your ribs may help with discomfort.  If you were given an incentive spirometer in the hospital, continue to use it as directed.  Walk as directed by your caregiver.  You may take a shower and gently wash the area of your surgical cut (incision) with water and soap as directed. Do not use anything else to clean your incision except as directed by your caregiver. Do not take baths or sit in a hot tub. SEEK MEDICAL CARE IF:  You notice redness, swelling, or increasing pain in the incision.  You are bleeding from the incision.  You see pus coming from the incision.  You notice a bad smell coming from the incision or dressing.  Your incision breaks open.  You cough up blood or pus, or you develop a cough that produces bad smelling sputum.  You have pain or swelling in your legs.  You have increasing pain that is not controlled with medicine.  You have trouble managing any of the tubes that have been left in place after surgery. SEEK IMMEDIATE MEDICAL CARE IF:   You have a fever or chills.  You have any reaction or side effects to medicines given.  You have chest pain or an  irregular or rapid heartbeat.  You have dizzy episodes or fainting.  You have shortness of breath or difficulty breathing.  You have persistent nausea or vomiting.  You have a rash. MAKE SURE YOU:  Understand these instructions.  Will watch your condition.  Will get help right away if you are not doing well or get worse. Document Released: 09/08/2004 Document Revised: 05/14/2011 Document Reviewed: 10/19/2010 [facility] Patient Information 2014 [place], [place]. Lung Cancer Lung cancer is a tumor which starts as a growth in your lungs. Cancer is a group of many related diseases that begin in cells, the building blocks of the body. Normally, cells grow and divide to produce more cells only when the body needs them. Sometimes cells keep dividing when new cells are not needed. These extra cells may form a mass of tissue called a growth or tumor. Tumors can be either benign (not cancerous) or malignant (cancerous). Cancer can begin in any organ or tissue of the body. The original tumor (where the tumor started out) is called the primary cancer and is usually named for where it begins.  Lung cancer is the most common cause of cancer death in men and women. There are several different types of lung cancers. Usually, lung cancer is described as either small-cell lung cancer or non-small-cell lung cancer. Other types of cancer occur in the lungs, including carcinoid and cancers spread from other organs. The types of cancer have different behavior and treatment. CAUSES  This cancer usually starts when the lungs are exposed  to harmful chemicals. When you quit smoking, your risk of lung cancer falls each year (but is never the same as a person who has never smoked).  Other risks include:   Radon gas exposure.  Asbestos and other industrial substance exposure.  Second hand tobacco smoke.  Air pollution.  Family or personal history of lung cancer.  Age over 46. SYMPTOMS  Lung cancer can cause  many symptoms. They depend on the type of cancer, its location and other factors. Symptoms of lung cancer can include:  Cough (either new, different or more severe).  Shortness of breath.  Coughing up blood (hemoptysis).  Chest pain.  Hoarseness.  Swelling of the face.  Drooping eyelid.  Changes in blood tests: low sodium (hyponatremia), high calcium (hypercalcemia) or low blood count (anemia).  Weight loss. In its early stages, lung cancer may not have symptoms and can be discovered by accident. Many of the symptoms above can be caused by diseases other than lung cancer. DIAGNOSIS  In early lung cancer, the patient often does not notice problems. It usually has spread by the time problems are first noticed. Your caregiver may suspect lung cancer based on your symptoms, your exam or based on tests (such as x-rays) obtained for other reasons. Common tests that help your caregiver diagnose your condition include:  Chest x-ray.  CT scan of the lungs and chest.  Blood tests. If a tumor is found, a biopsy will be necessary to confirm that cancer is present and to determine the type of cancer. TREATMENT   Surgery offers a hope for a cure if the cancer has not spread and the cancer is not a small cell (oat cell) cancer of the lung. Surgery cannot cure the small cell type of cancer.  Radiation Therapy is a form of high energy X-ray that helps slow or kill the cancer. It is often used along with medications (chemotherapy) to help treat the cancer and control pain.  Chemotherapy is used in combination with surgery in advanced cancer. It is also used in all small cell cancers.  Many new treatments look promising.  Your caregiver can give you more information and discuss treatment options that are best for your type of cancer. HOME CARE INSTRUCTIONS   If you smoke, stop!  Take all medications as told.  Keep all appointments with your caregiver and other specialists.  Ask your  caregiver if you should see a cancer specialist, if that has not been arranged.  If you require oxygen or breathing equipment, be sure you know how to use it and who to call with questions.  Follow any special diet directions. If you have problems with appetite, ask your caregiver for help. SEEK MEDICAL CARE IF:   You have had a surgical procedure are you are having trouble recovering.  You have ongoing weight loss.  You have decreased strength or energy past the point when your caregiver said you would feel better.  You develop nausea or lightheadedness.  You have pain that is not improving. SEEK IMMEDIATE MEDICAL CARE IF:   You cough up clotted blood or bright red blood.  Your pain is uncontrolled.  You develop new difficulty breathing or chest pain.  You develop swelling in one or both ankles or legs, or swelling in your face or neck.  You develop new headache or confusion. Document Released: 05/28/2000 Document Revised: 05/14/2011 Document Reviewed: 03/08/2008 [facility] Patient Information 2014 [place], [place].

## 2013-03-06 NOTE — Discharge Summary (Signed)
[address]       [place] [address]             [phone]       KRISANDRA BUENO 1950/11/16 63 y.o. [id number]  02/27/2013   Gaye Pollack, MD  rt upper lobe lung mass cornary stents RUL LUNG MASS CORONARY STENTS   HPI:  63 year old female who has been followed for several months for a right upper lobe mass concerning for bronchogenic carcinoma. She was seen by Dr. Cyndia Bent last June in consultation for a right upper lobe nodular density noted on chest x-ray that has been followed with serial CT scans. She additionally has had a PET scan with abnormal SUV. Complicating the issue regarding resection has been severe coronary artery disease requiring multiple stent placements. She also has known COPD with history of tobacco abuse. She was seen recently again by Dr. Cyndia Bent who feels that her medical status is stable at this time for resection. Due to her multiple drug alluting stents she is felt to require early admission for Integrilin bridge.She will stop taking her plavix after her dose on 02/24/2013. She will be admitted on 02/27/2013 for Integrillin bridge for planned right upper lobectomy on 03/02/2013. She quit smoking in August of this year.  Past Medical History   Diagnosis  Date   .  GERD (gastroesophageal reflux disease)    .  Hypercholesterolemia    .  Seasonal allergies    .  Tobacco abuse    .  Cataract      "? right" (06/24/2012)   .  Aortic valve stenosis, moderate    .  Heart murmur    .  Mass of lung      "right spot; they are watching it" (06/24/2012)   .  Cellulitis  06/20/2012     "dog bite; right forearm" (06/24/2012)   .  Arthritis      "across my hips; comes w/the weather" (06/24/2012)   .  Nonspecific abnormal unspecified cardiovascular function study    .  Peripheral vascular disease, unspecified    .  CAD (coronary artery disease), native coronary artery    .  Other and unspecified angina pectoris     Past Surgical History     Procedure  Laterality  Date   .  Mouth surgery   2010?     "for bone loss" (06/24/2012)   .  Video bronchoscopy   09/12/2011     Procedure: VIDEO BRONCHOSCOPY WITH FLUORO; Surgeon: Tanda Rockers, MD; Location: [facility]; Service: Cardiopulmonary; Laterality: Bilateral;   .  Tonsillectomy   1960   .  Ganglion cyst excision   1975   .  Coronary angioplasty with stent placement   12/08/2012     DES to RCA   multiple stents also including left main, circumflex and LAD.  Family History   Problem  Relation  Age of Onset   .  Alcohol abuse  Father     Social History: reports that she quit smoking about 8 months ago. Her smoking use included Cigarettes. She has a 20 pack-year smoking history. She has never used smokeless tobacco. She reports that she does not drink alcohol or use illicit drugs.  Allergies:  Allergies   Allergen  Reactions   .  Azithromycin  Shortness Of Breath   .  Ceclor [Cefaclor]  Shortness Of Breath   .  Morphine And Related  Terrible headache   .  Penicillins  Other (See Comments)     Unknown from childhood   .  Vicodin [Hydrocodone-Acetaminophen]  Nausea And Vomiting   .  Septra [Sulfamethoxazole-Trimethoprim]  Rash   .  Adhesive [Tape]  Other (See Comments)     Redness and swelling - use paper tape   .  Doxycycline  Other (See Comments)     Redness on the face    Medications Prior to Admission   Medication  Sig  Dispense  Refill   .  acetaminophen (TYLENOL) 500 MG tablet  Take 1,000 mg by mouth 3 (three) times daily as needed for pain.     [name]  albuterol (PROVENTIL HFA) 108 (90 BASE) MCG/ACT inhaler  Inhale 2 puffs into the lungs 2 (two) times daily as needed for wheezing or shortness of breath.     [name]  aspirin EC 81 MG tablet  Take 81 mg by mouth daily.     .  cholecalciferol (VITAMIN D) 1000 UNITS tablet  Take 1,000 Units by mouth at bedtime.     .  clopidogrel (PLAVIX) 75 MG tablet  Take 1 tablet (75 mg total) by mouth daily.  90 tablet  3   .  Coenzyme  Q10 (CO Q 10) 100 MG CAPS  Take 100 mg by mouth at bedtime.     .  lansoprazole (PREVACID) 30 MG capsule  Take 1 capsule (30 mg total) by mouth daily.     [name]  NASAL SALINE NA  Place 1 spray into the nose daily as needed.     .  nitroGLYCERIN (NITROSTAT) 0.4 MG SL tablet  Place 1 tablet (0.4 mg total) under the tongue every 5 (five) minutes as needed for chest pain.  25 tablet  4   .  OVER THE COUNTER MEDICATION  Take 1 tablet by mouth daily with lunch. [facility] Menopause support     .  ranitidine (ZANTAC) 150 MG tablet  Take 150 mg by mouth as needed for heartburn.     .  rosuvastatin (CRESTOR) 10 MG tablet  Take 10 mg by mouth 3 (three) times a week. Monday, Wednesday, Friday         Hospital Course:   The patient was admitted for Integrilin bridge following discontinuation of her Plavix. Dosing was managed per the pharmacy. She remained quite stable for planned surgery. On 03/02/2013 she was taken the operating room at which time she underwent the following procedure:  03/02/2013  Enid Baas  [id number]  Surgeon: Gaye Pollack, MD  First Assistant: Ellwood Handler, PA-C  Preoperative Diagnosis: Right upper lobe lung mass  Postoperative Diagnosis: Same  Procedure:  1. Flexible video bronchoscopy  2. Right muscle-sparing thoracotomy  3. Right upper lobectomy  4. Mediastinal lymph node dissection  Anesthesia: General Endotracheal She tolerated procedure well was taken to the postanesthesia care unit in satisfactory condition.  Postoperative hospital course:  The patient has done quite well. She is maintained stable hemodynamics. All routine lines, monitors and drainage devices have been discontinued in the standard fashion. She had serial postoperative chest x-rays to evaluate status with steady improvement. Pathology revealed the following:  Diagnosis Lung, resection (segmental or lobe), Right upper lobe - INVASIVE POORLY DIFFERENTIATED SQUAMOUS CELL CARCINOMA, 2.0 CM. - RESECTION  MARGIN, NEGATIVE FOR MALIGNANCY. - FIVE LYMPH NODES, NEGATIVE FOR METASTATIC CARCINOMA (0/5). For full details please see the final dictated pathology report.  She tolerated gradually increasing activities using standard postoperative protocols.  She had a mild acute blood loss anemia. This is stable. Incisions are noted to be healing well without evidence of infection. Oxygen was weaned and she maintains good saturations on room air. She is tolerating diet. Her overall status was felt to be quite stable for discharge on the morning of postoperative day #4. Of note her Plavix was restarted in the postoperative period.    Recent Labs  03/05/13 0450  NA 140  K 4.1  CL 101  CO2 29  GLUCOSE 93  BUN 12  CALCIUM 8.5    Recent Labs  03/05/13 0450  WBC 5.0  HGB 10.2*  HCT 32.1*  PLT 243   No results found for this basename: INR,  in the last 72 hours   Discharge Instructions:  The patient is discharged to home with extensive instructions on wound care and progressive ambulation.  They are instructed not to drive or perform any heavy lifting until returning to see the physician in his office.  Discharge Diagnosis:  rt upper lobe lung mass cornary stents RUL LUNG MASS CORONARY STENTS Diagnosis Lung, resection (segmental or lobe), Right upper lobe - INVASIVE POORLY DIFFERENTIATED SQUAMOUS CELL CARCINOMA, 2.0 CM. - RESECTION MARGIN, NEGATIVE FOR MALIGNANCY. - FIVE LYMPH NODES, NEGATIVE FOR METASTATIC CARCINOMA (0/5). Secondary Diagnosis: Patient Active Problem List   Diagnosis Date Noted  . Tachycardia, unspecified   . Lung mass 02/27/2013  . Mixed hyperlipidemia 12/26/2012  . Coronary atherosclerosis of native coronary artery 12/26/2012  . Other and unspecified angina pectoris   . CAD (coronary artery disease), native coronary artery   . Peripheral vascular disease, unspecified   . Nonspecific abnormal unspecified cardiovascular function study   . Cough 08/19/2012  .  Solitary pulmonary nodule 06/29/2012  . Pneumonia 06/26/2012  . Dog bite of forearm 06/23/2012  . Cellulitis 06/23/2012  . COPD Shawndell Schillaci II 11/07/2011  . Smoker 11/07/2011  . Multiple pulmonary nodules 08/25/2011  . Aortic stenosis 08/25/2011   Past Medical History  Diagnosis Date  . GERD (gastroesophageal reflux disease)   . Hypercholesterolemia   . Seasonal allergies   . Tobacco abuse   . Cataract     "just the beginnings on the right" (02/27/2013)  . Aortic valve stenosis, moderate   . Heart murmur   . Mass of lung     "small tumor RUL; they are watching it" (02/27/2013)  . Cellulitis 06/20/2012    "dog bite; right forearm" (06/24/2012)  . Nonspecific abnormal unspecified cardiovascular function study   . Peripheral vascular disease, unspecified   . CAD (coronary artery disease), native coronary artery   . Other and unspecified angina pectoris   . Myocardial infarction     "think dr said I've had 2 silent one" (02/27/2013)  . COPD (chronic obstructive pulmonary disease)     "little bit" (02/27/2013)  . Pneumonia 1990's    "once" (02/27/2013)  . Chronic bronchitis     "used to have it alot; haven't had it in a long time" (02/27/2013)  . Exertional shortness of breath     "sometimes" (02/27/2013)  . Arthritis     "across my hips; buttocks; comes w/the weather" (02/27/2013)  . Tachycardia, unspecified         Medication List         acetaminophen 500 MG tablet  Commonly known as:  TYLENOL  Take 1,000 mg by mouth 3 (three) times daily as needed for pain.     aspirin EC 81 MG tablet  Take 81 mg  by mouth daily.     cholecalciferol 1000 UNITS tablet  Commonly known as:  VITAMIN D  Take 1,000 Units by mouth at bedtime.     clopidogrel 75 MG tablet  Commonly known as:  PLAVIX  Take 1 tablet (75 mg total) by mouth daily.     Co Q 10 100 MG Caps  Take 100 mg by mouth at bedtime.     lansoprazole 30 MG capsule  Commonly known as:  PREVACID  Take 1 capsule (30 mg  total) by mouth daily.     NASAL SALINE NA  Place 1 spray into the nose daily as needed.     nitroGLYCERIN 0.4 MG SL tablet  Commonly known as:  NITROSTAT  Place 1 tablet (0.4 mg total) under the tongue every 5 (five) minutes as needed for chest pain.     OVER THE COUNTER MEDICATION  Take 1 tablet by mouth daily with lunch. [facility] Menopause support     PROVENTIL HFA 108 (90 BASE) MCG/ACT inhaler  Generic drug:  albuterol  Inhale 2 puffs into the lungs 2 (two) times daily as needed for wheezing or shortness of breath.     ranitidine 150 MG tablet  Commonly known as:  ZANTAC  Take 150 mg by mouth as needed for heartburn.     rosuvastatin 10 MG tablet  Commonly known as:  CRESTOR  Take 10 mg by mouth 3 (three) times a week. Monday, Wednesday, Friday       Follow-up Information   Follow up with Gaye Pollack, MD. (office will contact you)    Specialty:  Cardiothoracic Surgery   Contact information:   [address] [place] [address] [phone]      Disposition: Discharged home  Patient's condition is Kathyrn Drown, PA-C 03/06/2013  8:03 AM

## 2013-03-06 NOTE — Telephone Encounter (Signed)
Teresa [name] was discharged from the hospital today after thoracic surgery.  Tylenol was the only pain med prescribed.  Tramadol was discussed but not provided.  She is requesting it in case the Tylenol isn't sufficient.  I said I would discuss with Dr. Cyndia Bent this afternoon and if he agreed, would have him sign a script and fax to her pharmacy....she agreed.

## 2013-03-10 ENCOUNTER — Telehealth: Payer: Self-pay | Admitting: *Deleted

## 2013-03-10 NOTE — Telephone Encounter (Signed)
I called patient to check in to see how she is doing following surgery.  Patient reports that she is doing very well.  She was very pleased with how well her surgery and recovery has gone.  Her biggest fear prior to surgery was pain management and she reports that there are occasional brief periods of discomfort in her back, chest and side that have been managed well with tylenol and tramadol when needed.  She is trying to increase her activity with short walks.  She is tolerating the walks well with some shortness of breath if she walks too far.  She is also happy that the pain she had in her groin prior to surgery is gone and no longer causing pain when she walks.  We reviewed her upcoming appointments.  She denied any questions or concerns at this time.  I encouraged her to call me for any needs.

## 2013-03-13 ENCOUNTER — Ambulatory Visit (INDEPENDENT_AMBULATORY_CARE_PROVIDER_SITE_OTHER): Payer: Self-pay

## 2013-03-13 DIAGNOSIS — D381 Neoplasm of uncertain behavior of trachea, bronchus and lung: Secondary | ICD-10-CM

## 2013-03-13 DIAGNOSIS — Z4802 Encounter for removal of sutures: Secondary | ICD-10-CM

## 2013-03-13 NOTE — Progress Notes (Signed)
Removed 4 sutures from incision sites. No signs of infection and pt tolerated well.

## 2013-03-16 ENCOUNTER — Telehealth: Payer: Self-pay | Admitting: *Deleted

## 2013-03-16 NOTE — Telephone Encounter (Signed)
Called pt regarding appt with Dr. Julien Nordmann 03/31/12 labs at 11:00 and Dr. Julien Nordmann at 11:30.  She verbalized understanding of appt time and place

## 2013-03-23 ENCOUNTER — Other Ambulatory Visit: Payer: Self-pay | Admitting: *Deleted

## 2013-03-23 DIAGNOSIS — R918 Other nonspecific abnormal finding of lung field: Secondary | ICD-10-CM

## 2013-03-25 ENCOUNTER — Encounter: Payer: Self-pay | Admitting: Surgery

## 2013-03-25 ENCOUNTER — Ambulatory Visit (INDEPENDENT_AMBULATORY_CARE_PROVIDER_SITE_OTHER): Payer: Self-pay | Admitting: Surgery

## 2013-03-25 ENCOUNTER — Ambulatory Visit
Admission: RE | Admit: 2013-03-25 | Discharge: 2013-03-25 | Disposition: A | Discharge status: Home / Self Care | Payer: BC Managed Care – PPO | Source: Ambulatory Visit | Attending: Surgery | Admitting: Surgery

## 2013-03-25 VITALS — BP 137/83 | HR 76 | Resp 16 | Ht 59.0 in | Wt 134.0 lb

## 2013-03-25 DIAGNOSIS — R918 Other nonspecific abnormal finding of lung field: Secondary | ICD-10-CM

## 2013-03-25 DIAGNOSIS — C3410 Malignant neoplasm of upper lobe, unspecified bronchus or lung: Secondary | ICD-10-CM

## 2013-03-25 DIAGNOSIS — Z9889 Other specified postprocedural states: Secondary | ICD-10-CM

## 2013-03-25 DIAGNOSIS — Z902 Acquired absence of lung [part of]: Secondary | ICD-10-CM

## 2013-03-26 ENCOUNTER — Ambulatory Visit (INDEPENDENT_AMBULATORY_CARE_PROVIDER_SITE_OTHER): Payer: BC Managed Care – PPO | Admitting: Interventional Cardiology

## 2013-03-26 ENCOUNTER — Encounter: Payer: Self-pay | Admitting: Interventional Cardiology

## 2013-03-26 ENCOUNTER — Encounter: Payer: Self-pay | Admitting: Surgery

## 2013-03-26 VITALS — BP 112/70 | HR 64 | Ht 59.5 in | Wt 138.4 lb

## 2013-03-26 DIAGNOSIS — I2510 Atherosclerotic heart disease of native coronary artery without angina pectoris: Secondary | ICD-10-CM

## 2013-03-26 DIAGNOSIS — I359 Nonrheumatic aortic valve disorder, unspecified: Secondary | ICD-10-CM

## 2013-03-26 DIAGNOSIS — E782 Mixed hyperlipidemia: Secondary | ICD-10-CM

## 2013-03-26 DIAGNOSIS — I350 Nonrheumatic aortic (valve) stenosis: Secondary | ICD-10-CM

## 2013-03-26 MED ORDER — ROSUVASTATIN CALCIUM 10 MG PO TABS: ORAL_TABLET | ORAL | Status: DC

## 2013-03-26 NOTE — Progress Notes (Signed)
HPI:  Patient returns for routine postoperative follow-up having undergone right thoracotomy and right upper lobectomy on 03/02/2013. The final pathology showed T2, N0 poorly differentiated squamous cell carcinoma with 5 lymph nodes negative. There was focal visceral pleural invasion and negative surgical margins. The patient's early postoperative recovery while in the hospital was notable for an uncomplicated postop course. Since hospital discharge the patient reports that she has done well. She is walking daily without pain or dyspnea.   Current Outpatient Prescriptions  Medication Sig Dispense Refill  . acetaminophen (TYLENOL) 500 MG tablet Take 1,000 mg by mouth 3 (three) times daily as needed for pain.       [name] albuterol (PROVENTIL HFA) 108 (90 BASE) MCG/ACT inhaler Inhale 2 puffs into the lungs 2 (two) times daily as needed for wheezing or shortness of breath.       [name] aspirin EC 81 MG tablet Take 81 mg by mouth daily.      . cholecalciferol (VITAMIN D) 1000 UNITS tablet Take 1,000 Units by mouth at bedtime.       . clopidogrel (PLAVIX) 75 MG tablet Take 1 tablet (75 mg total) by mouth daily.  90 tablet  3  . Coenzyme Q10 (CO Q 10) 100 MG CAPS Take 100 mg by mouth at bedtime.       . lansoprazole (PREVACID) 30 MG capsule Take 1 capsule (30 mg total) by mouth daily.      [name] NASAL SALINE NA Place 1 spray into the nose daily as needed.       . nitroGLYCERIN (NITROSTAT) 0.4 MG SL tablet Place 1 tablet (0.4 mg total) under the tongue every 5 (five) minutes as needed for chest pain.  25 tablet  4  . OVER THE COUNTER MEDICATION Take 1 tablet by mouth daily with lunch. [facility] Menopause support      . ranitidine (ZANTAC) 150 MG tablet Take 150 mg by mouth as needed for heartburn.      . rosuvastatin (CRESTOR) 10 MG tablet Take 10 mg by mouth 3 (three) times a week. Monday, Wednesday, Friday      . traMADol (ULTRAM) 50 MG tablet Take 1 tablet (50 mg total) by mouth every 6 (six) hours as needed.   40 tablet  0   No current facility-administered medications for this visit.    Physical Exam: BP 137/83  Pulse 76  Resp 16  Ht 4\' 11"  (1.499 m)  Wt 134 lb (60.782 kg)  BMI 27.05 kg/m2  SpO2 97%  LMP 10/04/1999 She looks well. Lung exam is clear. Cardiac exam shows a regular rate and rhythm with normal heart sounds. Chest incision is healing well.   Diagnostic Tests:  CLINICAL DATA: Lung mass. Post lobectomy  EXAM:  CHEST 2 VIEW  COMPARISON: 03/06/2013  FINDINGS:  Postop changes on the right. Improvement in right pleural  thickening. No pneumothorax or significant effusion. Mild elevation  of the right hemidiaphragm. Left lung remains clear. Extensive  coronary artery calcification noted.  IMPRESSION:  Improved aeration on the right following lobectomy. No acute  abnormality.  Severe coronary artery calcification.  Electronically Signed  By: Franchot Gallo M.D.  On: 03/25/2013 10:06    Impression:  She is doing well following right upper lobectomy for stage 1B squamous cell lung cancer. She says she has quit smoking for good. I told her she can return to driving but asked her not to do any heavy lifting for 3 months postop.  Plan:  She has an appt to see Dr. Julien Nordmann of medical oncology next week. I will plan to see her back in 3 months. She can be followed for long term surveillance by Dr. Julien Nordmann if that is acceptable to him after he sees her.

## 2013-03-26 NOTE — Patient Instructions (Signed)
Your physician recommends that you return for a FASTING lipid profile on 06/24/13.  Your physician has recommended you make the following change in your medication:   1. Increase Crestor 10mg  to four times a week.   Your physician wants you to follow-up in: 4 months with Dr. Irish Lack. You will receive a reminder letter in the mail two months in advance. If you don't receive a letter, please call our office to schedule the follow-up appointment.

## 2013-03-26 NOTE — Progress Notes (Signed)
Patient ID: Teresa [name], female   DOB: [birth year], 63 y.o.   MRN: [id number]    [place], [address], [place]  [address] Phone: [phone] Fax:  [phone]  Date:  03/26/2013   ID:  Teresa [name], Teresa [name] 06/26/1950, MRN [id number]  PCP:  Horton Finer, MD      History of Present Illness: Teresa [name] is a 63 y.o. female who has had a complex left main , circumflex , LAD stenting after a dissection during PCI. She had PCI of the RCA complicated by dissection and required 3 stents. She had successful lung surgery to remove a malignancy. She is nonverbal well since that time. She has not had problems with regular walking. She has not had any excessive shortness of breath either.  CAD/ASCVD:  Denies :  Chest pain Dizziness.  Dyspnea on exertion.  Fatigue.  Nitroglycerin.  Orthopnea.  Palpitations.  She has a headache since starting isosorbide.  The isosorbide was subsequently stopped.    Wt Readings from Last 3 Encounters:  03/26/13 138 lb 6.4 oz (62.778 kg)  03/25/13 134 lb (60.782 kg)  03/05/13 141 lb 8.6 oz (64.2 kg)     Past Medical History  Diagnosis Date  . GERD (gastroesophageal reflux disease)   . Hypercholesterolemia   . Seasonal allergies   . Tobacco abuse   . Cataract     "just the beginnings on the right" (02/27/2013)  . Aortic valve stenosis, moderate   . Heart murmur   . Mass of lung     "small tumor RUL; they are watching it" (02/27/2013)  . Cellulitis 06/20/2012    "dog bite; right forearm" (06/24/2012)  . Nonspecific abnormal unspecified cardiovascular function study   . Peripheral vascular disease, unspecified   . CAD (coronary artery disease), native coronary artery   . Other and unspecified angina pectoris   . Myocardial infarction     "think dr said I've had 2 silent one" (02/27/2013)  . COPD (chronic obstructive pulmonary disease)     "little bit" (02/27/2013)  . Pneumonia 1990's    "once" (02/27/2013)  .  Chronic bronchitis     "used to have it alot; haven't had it in a long time" (02/27/2013)  . Exertional shortness of breath     "sometimes" (02/27/2013)  . Arthritis     "across my hips; buttocks; comes w/the weather" (02/27/2013)  . Tachycardia, unspecified     Current Outpatient Prescriptions  Medication Sig Dispense Refill  . acetaminophen (TYLENOL) 500 MG tablet Take 1,000 mg by mouth 3 (three) times daily as needed for pain.       [name] albuterol (PROVENTIL HFA) 108 (90 BASE) MCG/ACT inhaler Inhale 2 puffs into the lungs 2 (two) times daily as needed for wheezing or shortness of breath.       [name] aspirin EC 81 MG tablet Take 81 mg by mouth daily.      . cholecalciferol (VITAMIN D) 1000 UNITS tablet Take 1,000 Units by mouth at bedtime.       . clopidogrel (PLAVIX) 75 MG tablet Take 1 tablet (75 mg total) by mouth daily.  90 tablet  3  . Coenzyme Q10 (CO Q 10) 100 MG CAPS Take 100 mg by mouth at bedtime.       . lansoprazole (PREVACID) 30 MG capsule Take 1 capsule (30 mg total) by mouth daily.      [name] NASAL SALINE NA Place 1 spray into the nose daily as  needed.       . nitroGLYCERIN (NITROSTAT) 0.4 MG SL tablet Place 1 tablet (0.4 mg total) under the tongue every 5 (five) minutes as needed for chest pain.  25 tablet  4  . OVER THE COUNTER MEDICATION Take 1 tablet by mouth daily with lunch. [facility] Menopause support      . ranitidine (ZANTAC) 150 MG tablet Take 150 mg by mouth as needed for heartburn.      . rosuvastatin (CRESTOR) 10 MG tablet Take 10 mg by mouth 3 (three) times a week. Monday, Wednesday, Friday      . traMADol (ULTRAM) 50 MG tablet Take 1 tablet (50 mg total) by mouth every 6 (six) hours as needed.  40 tablet  0   No current facility-administered medications for this visit.    Allergies:    Allergies  Allergen Reactions  . Azithromycin Shortness Of Breath  . Ceclor [Cefaclor] Shortness Of Breath  . Morphine And Related     Terrible headache  . Penicillins Other (See  Comments)    Unknown from childhood  . Vicodin [Hydrocodone-Acetaminophen] Nausea And Vomiting  . Septra [Sulfamethoxazole-Trimethoprim] Rash  . Adhesive [Tape] Other (See Comments)    Redness and swelling - use paper tape  . Doxycycline Other (See Comments)    Redness on the face    Social History:  The patient  reports that she quit smoking about 5 months ago. Her smoking use included Cigarettes. She has a 20 pack-year smoking history. She has never used smokeless tobacco. She reports that she does not drink alcohol or use illicit drugs.   Family History:  The patient's family history includes Alcohol abuse in her father.   ROS:  Please see the history of present illness.  No nausea, vomiting.  No fevers, chills.  No focal weakness.  No dysuria.    All other systems reviewed and negative.   PHYSICAL EXAM: VS:  BP 112/70  Pulse 64  Ht 4' 11.5" (1.511 m)  Wt 138 lb 6.4 oz (62.778 kg)  BMI 27.50 kg/m2  LMP 10/04/1999 Well nourished, well developed, in no acute distress HEENT: normal Neck: no JVD, no carotid bruits Cardiac:  normal S1, S2; RRR; 2/6 systolc Lungs:  clear to auscultation bilaterally, no wheezing, rhonchi or rales Abd: soft, nontender, no hepatomegaly Ext: no edema Skin: warm and dry Neuro:   no focal abnormalities noted     ASSESSMENT AND PLAN:  1. CAD: P2Y12 testing showed Plavix was adequate antiplatelet therapy. No further chest discomfort since RCA stents. Left-sided stents were widely patent. Her lung cancer surgery was done in December 2014.She had a moderate lesion in the proximal right coronary artery but it did not appear to be hemodynamically significant.  She has restarted her DAPT without bleeding problems. Tolerated lung surgery well. 2. Hyperlipidemia: Last LDL was 136 while she is on Crestor 10 mg 3 times a week. Increase crestor 4x/week to get to an LDL of at least below 100.  Recheck lipids in 3 months. 3. Aortic stenosis: Mild to moderate in the  past.   Signed, Mina Marble, MD, [facility] 03/26/2013 3:41 PM

## 2013-03-31 ENCOUNTER — Telehealth: Payer: Self-pay | Admitting: Internal Medicine

## 2013-03-31 ENCOUNTER — Encounter: Payer: Self-pay | Admitting: Internal Medicine

## 2013-03-31 ENCOUNTER — Ambulatory Visit: Payer: BC Managed Care – PPO

## 2013-03-31 ENCOUNTER — Encounter: Payer: Self-pay | Admitting: *Deleted

## 2013-03-31 ENCOUNTER — Other Ambulatory Visit: Payer: BC Managed Care – PPO

## 2013-03-31 ENCOUNTER — Other Ambulatory Visit: Payer: Self-pay | Admitting: *Deleted

## 2013-03-31 ENCOUNTER — Ambulatory Visit (HOSPITAL_BASED_OUTPATIENT_CLINIC_OR_DEPARTMENT_OTHER): Payer: BC Managed Care – PPO | Admitting: Internal Medicine

## 2013-03-31 VITALS — BP 155/56 | HR 86 | Temp 97.6°F | Resp 18 | Ht 59.0 in | Wt 138.3 lb

## 2013-03-31 DIAGNOSIS — C3410 Malignant neoplasm of upper lobe, unspecified bronchus or lung: Secondary | ICD-10-CM

## 2013-03-31 DIAGNOSIS — R918 Other nonspecific abnormal finding of lung field: Secondary | ICD-10-CM

## 2013-03-31 NOTE — Progress Notes (Signed)
Checked in new patient with no financial issues. She has appt card.

## 2013-03-31 NOTE — Patient Instructions (Signed)
Followup visit in 6 months with repeat CT scan of the chest.

## 2013-03-31 NOTE — Progress Notes (Signed)
[facility] Telephone:([phone]   [phone]  CONSULT NOTE  REFERRING PHYSICIAN: Dr. Army Chaco  REASON FOR CONSULTATION:  63 years old white female recently diagnosed with lung cancer  HPI Teresa [name] is a 63 y.o. female with past medical history significant for multiple medical problems including history of pneumonia, peripheral vascular disease, dyslipidemia, COPD, chronic disease status post several stent placement as well as history of aortic stenosis and long history of smoking but quit in August of 2014.  The patient mentions that in December of 2012 while cleaning up a rental house, she felt sick with cough and chest congestion. Chest x-ray performed at that time showed questionable left lung nodule. She was referred to Dr. Melvyn Novas and she was followed closely with repeat CT scan of the chest. In June of 2014 CT scan of the chest without contrast showed persistent masslike irregular lesion in the right lung apex questionable for malignancy. This was followed by a PET scan on 08/23/2012 and it showed an irregular right apical opacity measuring 1.4 x 1.1 CM with maximum SUV of 4.3 worrisome for primary bronchogenic neoplasm.  The patient was referred to Dr. Cyndia Bent and repeat CT scan of the chest on 12/10/2012 showed no substantial interval change in the irregular right apical nodule and no other new or progressive findings. On 03/02/2013 the patient underwent flexible video bronchoscopy, right upper lobectomy with mediastinal lymph node dissection under the care of Dr. Cyndia Bent. The final pathology (Accession: [id number]) showed invasive poorly differentiated squamous cell carcinoma measuring 2.0 CM. The resection margin was negative for malignancy and the dissected lymph nodes were also negative for metastatic carcinoma.  Dr. Cyndia Bent kindly referred the patient to me today for evaluation and recommendation regarding treatment of her condition. When seen today  the patient is feeling fine with no specific complaints except for soreness at the right side of the chest after the surgical resection. She also has shortness breath with exertion but no significant cough or hemoptysis. She denied having any significant weight loss or night sweats. The patient denied having any headache or visual changes.  Family history significant for father who died from liver cirrhosis at age 52 mother died from lupus complication at age 75 she also has a brother who had pulmonary fibrosis. The patient is married and has one child. She was accompanied by her husband Theadore Nan and her son Gerald Stabs. She works in Software engineer. She has a history of smoking one pack per day for around 45 years and quit August of 2014. She has no history of alcohol drug abuse.  HPI  Past Medical History  Diagnosis Date  . GERD (gastroesophageal reflux disease)   . Hypercholesterolemia   . Seasonal allergies   . Tobacco abuse   . Cataract     "just the beginnings on the right" (02/27/2013)  . Aortic valve stenosis, moderate   . Heart murmur   . Mass of lung     "small tumor RUL; they are watching it" (02/27/2013)  . Cellulitis 06/20/2012    "dog bite; right forearm" (06/24/2012)  . Nonspecific abnormal unspecified cardiovascular function study   . Peripheral vascular disease, unspecified   . CAD (coronary artery disease), native coronary artery   . Other and unspecified angina pectoris   . Myocardial infarction     "think dr said I've had 2 silent one" (02/27/2013)  . COPD (chronic obstructive pulmonary disease)     "little bit" (02/27/2013)  . Pneumonia 1990's    "  once" (02/27/2013)  . Chronic bronchitis     "used to have it alot; haven't had it in a long time" (02/27/2013)  . Exertional shortness of breath     "sometimes" (02/27/2013)  . Arthritis     "across my hips; buttocks; comes w/the weather" (02/27/2013)  . Tachycardia, unspecified     Past Surgical History  Procedure Laterality Date   . Mouth surgery  2010?    "for bone loss" (06/24/2012)  . Video bronchoscopy  09/12/2011    Procedure: VIDEO BRONCHOSCOPY WITH FLUORO;  Surgeon: Tanda Rockers, MD;  Location: [facility];  Service: Cardiopulmonary;  Laterality: Bilateral;  . Tonsillectomy  1960  . Ganglion cyst excision Left 1975    "wrist"  . Coronary angioplasty with stent placement  10/2012; 12/08/2012    "7 + 3" (02/27/2013)  . Flexible bronchoscopy N/A 03/02/2013    Procedure: FLEXIBLE BRONCHOSCOPY;  Surgeon: Gaye Pollack, MD;  Location: [facility];  Service: Thoracic;  Laterality: N/A;  . Thoracotomy/lobectomy Right 03/02/2013    Procedure: Right Video Assisted Thoracoscopy/Thoracotomy with upper Lobectomy;  Surgeon: Gaye Pollack, MD;  Location[facility];  Service: Thoracic;  Laterality: Right;  Right Lung Upper  Lobectomy     Family History  Problem Relation Age of Onset  . Alcohol abuse Father     Social History History  Substance Use Topics  . Smoking status: Former Smoker -- 0.50 packs/day for 40 years    Types: Cigarettes    Quit date: 10/08/2012  . Smokeless tobacco: Never Used  . Alcohol Use: No    Allergies  Allergen Reactions  . Azithromycin Shortness Of Breath  . Ceclor [Cefaclor] Shortness Of Breath  . Morphine And Related     Terrible headache  . Penicillins Other (See Comments)    Unknown from childhood  . Vicodin [Hydrocodone-Acetaminophen] Nausea And Vomiting  . Septra [Sulfamethoxazole-Trimethoprim] Rash  . Adhesive [Tape] Other (See Comments)    Redness and swelling - use paper tape  . Doxycycline Other (See Comments)    Redness on the face    Current Outpatient Prescriptions  Medication Sig Dispense Refill  . acetaminophen (TYLENOL) 500 MG tablet Take 1,000 mg by mouth 3 (three) times daily as needed for pain.       [name] albuterol (PROVENTIL HFA) 108 (90 BASE) MCG/ACT inhaler Inhale 2 puffs into the lungs 2 (two) times daily as needed for wheezing or shortness of breath.       [name]  aspirin EC 81 MG tablet Take 81 mg by mouth daily.      . cholecalciferol (VITAMIN D) 1000 UNITS tablet Take 1,000 Units by mouth at bedtime.       . clopidogrel (PLAVIX) 75 MG tablet Take 1 tablet (75 mg total) by mouth daily.  90 tablet  3  . Coenzyme Q10 (CO Q 10) 100 MG CAPS Take 100 mg by mouth at bedtime.       . lansoprazole (PREVACID) 30 MG capsule Take 1 capsule (30 mg total) by mouth daily.      [name] NASAL SALINE NA Place 1 spray into the nose daily as needed.       . nitroGLYCERIN (NITROSTAT) 0.4 MG SL tablet Place 1 tablet (0.4 mg total) under the tongue every 5 (five) minutes as needed for chest pain.  25 tablet  4  . OVER THE COUNTER MEDICATION Take 1 tablet by mouth daily with lunch. [facility] Menopause support      . ranitidine (ZANTAC)  150 MG tablet Take 150 mg by mouth as needed for heartburn.      . rosuvastatin (CRESTOR) 10 MG tablet 1 TABLET FOUR TIMES A WEEK  21 tablet  0  . traMADol (ULTRAM) 50 MG tablet Take 1 tablet (50 mg total) by mouth every 6 (six) hours as needed.  40 tablet  0   No current facility-administered medications for this visit.    Review of Systems  Constitutional: negative Eyes: negative Ears, nose, mouth, throat, and face: negative Respiratory: positive for dyspnea on exertion and pleurisy/chest pain Cardiovascular: negative Gastrointestinal: negative Genitourinary:negative Integument/breast: negative Hematologic/lymphatic: negative Musculoskeletal:negative Neurological: negative Behavioral/Psych: negative Endocrine: negative Allergic/Immunologic: negative  Physical Exam  XBM:WUXLK, healthy, no distress, well nourished and well developed SKIN: skin color, texture, turgor are normal, no rashes or significant lesions HEAD: Normocephalic, No masses, lesions, tenderness or abnormalities EYES: normal, PERRLA EARS: External ears normal, Canals clear OROPHARYNX:no exudate, no erythema and lips, buccal mucosa, and tongue normal  NECK: supple, no  adenopathy, no JVD LYMPH:  no palpable lymphadenopathy, no hepatosplenomegaly BREAST:not examined LUNGS: clear to auscultation , and palpation HEART: regular rate & rhythm, no murmurs and no gallops ABDOMEN:abdomen soft, non-tender, normal bowel sounds and no masses or organomegaly BACK: Back symmetric, no curvature., No CVA tenderness EXTREMITIES:no joint deformities, effusion, or inflammation, no edema, no skin discoloration  NEURO: alert & oriented x 3 with fluent speech, no focal motor/sensory deficits  PERFORMANCE STATUS: ECOG 1  LABORATORY DATA: Lab Results  Component Value Date   WBC 5.0 03/05/2013   HGB 10.2* 03/05/2013   HCT 32.1* 03/05/2013   MCV 89.9 03/05/2013   PLT 243 03/05/2013      Chemistry      Component Value Date/Time   NA 140 03/05/2013 0450   K 4.1 03/05/2013 0450   CL 101 03/05/2013 0450   CO2 29 03/05/2013 0450   BUN 12 03/05/2013 0450   CREATININE 0.56 03/05/2013 0450      Component Value Date/Time   CALCIUM 8.5 03/05/2013 0450   ALKPHOS 70 03/01/2013 1350   AST 14 03/01/2013 1350   ALT 13 03/01/2013 1350   BILITOT 0.2* 03/01/2013 1350       RADIOGRAPHIC STUDIES: Dg Chest 2 View  03/25/2013   CLINICAL DATA:  Lung mass.  Post lobectomy  EXAM: CHEST  2 VIEW  COMPARISON:  03/06/2013  FINDINGS: Postop changes on the right. Improvement in right pleural thickening. No pneumothorax or significant effusion. Mild elevation of the right hemidiaphragm. Left lung remains clear. Extensive coronary artery calcification noted.  IMPRESSION: Improved aeration on the right following lobectomy. No acute abnormality.  Severe coronary artery calcification.   Electronically Signed   By: Franchot Gallo M.D.   On: 03/25/2013 10:06   Dg Chest 2 View  03/06/2013   CLINICAL DATA:  Post right upper lobectomy.  EXAM: CHEST  2 VIEW  COMPARISON:  03/05/2013  FINDINGS: Right-sided chest tube and right central line and been removed. Questionable tiny right lateral loculated pneumothorax versus result  of overlying structures. Attention to this on follow up.  Rounded appearance of the lateral margin of the right paratracheal region is new from the prior examination. This appears atypical for that secondary to vascular silhouette and postsurgical changes such is hematoma not excluded.  Postsurgical changes right lung with elevated right hemidiaphragm.  Prominent vascular calcifications included in prominent coronary artery calcifications.  Cardiac silhouette top-normal.  IMPRESSION: Right-sided chest tube and right central line and been removed. Questionable tiny  right lateral loculated pneumothorax versus result of overlying structures. Attention to this on follow up.  Rounded appearance of the lateral margin of the right paratracheal region is new from the prior examination. This appears atypical for that secondary to vascular silhouette and postsurgical changes such is hematoma not excluded.  These results will be called to the ordering clinician or representative by the Radiologist Assistant, and communication documented in the PACS Dashboard.   Electronically Signed   By: Chauncey Cruel M.D.   On: 03/06/2013 07:38   Dg Chest 2 View  03/02/2013   CLINICAL DATA:  Preoperative right lung lobectomy.  EXAM: CHEST  2 VIEW  COMPARISON:  02/04/2013  FINDINGS: Shallow inspiration. Normal heart size and pulmonary vascularity. Coronary artery calcification or stents. Increased density in the right upper lung corresponds to nodularity and spiculation demonstrated on CT but is partially obscured by overlying EKG leads. No acute consolidation or airspace disease. No blunting of costophrenic angles. No pneumothorax.  IMPRESSION: Vague nodular infiltration in the right upper lung consistent with known parenchymal lesion. No evidence of active pulmonary disease otherwise.   Electronically Signed   By: Lucienne Capers M.D.   On: 03/02/2013 00:16   Dg Chest Port 1 View  03/05/2013   CLINICAL DATA:  No chest pain, shortness  of breath  EXAM: PORTABLE CHEST - 1 VIEW  COMPARISON:  03/04/2013  FINDINGS: There is a right-sided chest tube directed towards the apex without a pneumothorax. The 2nd right-sided chest to has been removed. There is no significant pleural effusion. There is a right-sided jugular central venous catheter with the tip projecting at the cavoatrial junction. There is no focal consolidation. There is no left pleural effusion. There are postsurgical changes in the right lung from partial lobectomy. There is elevation of the right diaphragm. The heart and mediastinal contours are unremarkable.  The osseous structures are unremarkable.  IMPRESSION: Right-sided chest to directed towards the apex without a pneumothorax.   Electronically Signed   By: Kathreen Devoid   On: 03/05/2013 08:12   Dg Chest Port 1 View  03/04/2013   CLINICAL DATA:  Thoracotomy.  EXAM: PORTABLE CHEST - 1 VIEW  COMPARISON:  03/03/2013.  FINDINGS: Two right chest tubes noted in stable position. Right IJ line in stable position. Postsurgical changes right lung noted. No evidence of pneumothorax. Stable cardiomegaly.  IMPRESSION: Postsurgical changes right lung with stable positioning of right chest tubes. No pneumothorax.   Electronically Signed   By: Marcello Moores  Register   On: 03/04/2013 07:52   Dg Chest Port 1 View  03/03/2013   CLINICAL DATA:  Postop from right upper lobectomy for right upper lobe mass.  EXAM: PORTABLE CHEST - 1 VIEW  COMPARISON:  03/02/2013  FINDINGS: Two right chest tubes remain in place as well as a right internal jugular central venous catheter. No pneumothorax identified. Surgical staples seen in right upper lung field. No evidence of pulmonary infiltrate or pleural effusion. Heart size and mediastinal contours are stable.  IMPRESSION: Stable postop chest. No evidence of pneumothorax or other acute findings.   Electronically Signed   By: Earle Gell M.D.   On: 03/03/2013 08:38   Dg Chest Portable 1 View  03/02/2013   CLINICAL  DATA:  Postoperative evaluation  EXAM: PORTABLE CHEST - 1 VIEW  COMPARISON:  03/01/2013  FINDINGS: Multiple coronary stents are again identified. The cardiac shadow is stable. A 2 right-sided chest tubes are noted. No residual pneumothorax is seen. A right-sided central venous  line is noted with the tip at the cavoatrial junction. The lungs are clear.  IMPRESSION: Tubes and lines as described.  No pneumothorax.   Electronically Signed   By: Inez Catalina M.D.   On: 03/02/2013 12:00    ASSESSMENT: This is a very pleasant 63 years old white female recently diagnosed with a stage IB (T2a., N0, M0) poorly differentiated squamous cell carcinoma, status post right upper lobectomy with lymph node dissection.   PLAN: I have a lengthy discussion with the patient and her family today about her current disease stage, prognosis and treatment options. I explained to the patient that the five-year survival for stage IB is in the range of 60-80%. I also explained to the patient that there is no clear survival benefit for adjuvant chemotherapy for patient with a stage IB with tumor size less than 4.0 CM. I recommended for the patient to continue on observation with repeat CT scan of the chest every 6 months for the next 2-3 years followed by annual CT scan of the chest for 5-10 years. I gave the patient and her family the time to ask questions and I answered them completely to their satisfaction. She would come back for followup visit in 6 months with repeat CT scan of the chest. She was advised to call immediately if she has any concerning symptoms in the interval.   The patient voices understanding of current disease status and treatment options and is in agreement with the current care plan.  All questions were answered. The patient knows to call the clinic with any problems, questions or concerns. We can certainly see the patient much sooner if necessary.  Thank you so much for allowing me to participate in the  care of Nedrow. I will continue to follow up the patient with you and assist in her care.  I spent 40 minutes counseling the patient face to face. The total time spent in the appointment was 60 minutes.  Disclaimer: This note was dictated with voice recognition software. Similar sounding words can inadvertently be transcribed and may not be corrected upon review.   Christopherjohn Schiele K. 03/31/2013, 12:43 PM

## 2013-03-31 NOTE — Progress Notes (Signed)
Patient at [facility] for appointment with Dr. Julien Nordmann, accompanied by her husband and son.  Patient reports that she is doing very well.  She does experience some tenderness at the surgical incision site and reports that Dr. Cyndia Bent had explained that it is secondary to the nerve endings healing.  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  She denied any questions or concerns at this time.  I encouraged her to call me for any needs.

## 2013-03-31 NOTE — Telephone Encounter (Signed)
gave pt appt for lab and MD on July 2015

## 2013-04-20 ENCOUNTER — Telehealth: Payer: Self-pay | Admitting: Interventional Cardiology

## 2013-04-20 NOTE — Telephone Encounter (Signed)
Do we dispense antibiotic?

## 2013-04-20 NOTE — Telephone Encounter (Signed)
She should take antibiotic prior.  If dentist ok with PLavix, then ok to have done.  Can't really stop plavix at this point for dental work.

## 2013-04-20 NOTE — Telephone Encounter (Signed)
New message     Can she have dental work?----pt has an abscessed tooth.  She is on plavix and aspirin.

## 2013-04-20 NOTE — Telephone Encounter (Signed)
To Dr. Irish Lack, please advise.

## 2013-04-22 ENCOUNTER — Telehealth: Payer: Self-pay | Admitting: Interventional Cardiology

## 2013-04-22 MED ORDER — CLINDAMYCIN HCL 150 MG PO CAPS: ORAL_CAPSULE | ORAL | Status: DC

## 2013-04-22 NOTE — Telephone Encounter (Signed)
Ok. It will be clindamycin based on her allergies.  Please check with Ysidro Evert regarding the dose.

## 2013-04-22 NOTE — Telephone Encounter (Signed)
New patient  Patient is on a blood thinner and wants to know if it is safe to have a root canal? Please call and advice.

## 2013-04-22 NOTE — Telephone Encounter (Signed)
Clindamycin 600 mg 1 hour prior to dental appointment.  It comes in 150 mg, so she'll need to take 4 capsules 1 hour prior to appointment.

## 2013-04-22 NOTE — Telephone Encounter (Signed)
Called pt back. See other telephone notes.

## 2013-04-22 NOTE — Addendum Note (Signed)
Addended byUlla Potash H on: 04/22/2013 10:29 AM   Modules accepted: Orders

## 2013-04-22 NOTE — Telephone Encounter (Signed)
Teresa [name], please advise on dose clindamycin. [name]

## 2013-04-22 NOTE — Telephone Encounter (Signed)
Pt notified. Clindamycin rx sent in. Pt aware that she can have dental work done as long as she stays on the plavix.

## 2013-04-27 ENCOUNTER — Other Ambulatory Visit: Payer: Self-pay | Admitting: Surgery

## 2013-04-27 ENCOUNTER — Other Ambulatory Visit: Payer: Self-pay | Admitting: *Deleted

## 2013-04-27 DIAGNOSIS — G8918 Other acute postprocedural pain: Secondary | ICD-10-CM

## 2013-05-07 ENCOUNTER — Telehealth: Payer: Self-pay | Admitting: Interventional Cardiology

## 2013-05-07 NOTE — Telephone Encounter (Signed)
At last OV we increased Crestor to four times a week. Can pt go back to three times a week?

## 2013-05-07 NOTE — Telephone Encounter (Signed)
Ok to go to 3x/week

## 2013-05-07 NOTE — Telephone Encounter (Signed)
New message    Patient calling wants to go back to orginial dosage medication - 3 night a weeks. Due to muscles given out.

## 2013-05-08 MED ORDER — ROSUVASTATIN CALCIUM 10 MG PO TABS: ORAL_TABLET | ORAL | Status: DC

## 2013-05-08 NOTE — Telephone Encounter (Signed)
Pt aware, meds updated.

## 2013-05-29 ENCOUNTER — Telehealth: Payer: Self-pay | Admitting: Interventional Cardiology

## 2013-05-29 ENCOUNTER — Other Ambulatory Visit: Payer: Self-pay | Admitting: Surgery

## 2013-05-29 NOTE — Telephone Encounter (Signed)
Pt.notified

## 2013-05-29 NOTE — Telephone Encounter (Signed)
Teresa [name], since pt is taking plavix she should only take tylenol right? No advil/ibuprofen.

## 2013-05-29 NOTE — Telephone Encounter (Signed)
Correct, prefer tylenol given possible GI bleed risk with advil/ibuprofen

## 2013-05-29 NOTE — Telephone Encounter (Signed)
New message     Wants to know is it safe to take advil.

## 2013-06-11 ENCOUNTER — Other Ambulatory Visit: Payer: Self-pay | Admitting: Thoracic Surgery (Cardiothoracic Vascular Surgery)

## 2013-06-19 ENCOUNTER — Other Ambulatory Visit: Payer: Self-pay | Admitting: Surgery

## 2013-06-19 DIAGNOSIS — I359 Nonrheumatic aortic valve disorder, unspecified: Secondary | ICD-10-CM

## 2013-06-23 ENCOUNTER — Encounter: Payer: Self-pay | Admitting: *Deleted

## 2013-06-23 NOTE — Progress Notes (Signed)
I called patient to check in.  Patient reports that she is doing very well.  She continues to have pain in her right chest that appears to move from her back to beneath her breast.  She also reports burning at the lower chest tube insertion site.  Patient has an appointment with Dr. Cyndia Bent tomorrow and will discuss with him.  She has taken tylenol for pain with relief but uses tramadol at night.  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  Patient denied any questions or concerns at this time.  I encouraged her to call me for any needs.

## 2013-06-24 ENCOUNTER — Ambulatory Visit (INDEPENDENT_AMBULATORY_CARE_PROVIDER_SITE_OTHER): Payer: BC Managed Care – PPO | Admitting: Surgery

## 2013-06-24 ENCOUNTER — Ambulatory Visit
Admission: RE | Admit: 2013-06-24 | Discharge: 2013-06-24 | Disposition: A | Discharge status: Home / Self Care | Payer: BC Managed Care – PPO | Source: Ambulatory Visit | Attending: Surgery | Admitting: Surgery

## 2013-06-24 ENCOUNTER — Other Ambulatory Visit (INDEPENDENT_AMBULATORY_CARE_PROVIDER_SITE_OTHER): Payer: BC Managed Care – PPO

## 2013-06-24 ENCOUNTER — Other Ambulatory Visit: Payer: Self-pay | Admitting: *Deleted

## 2013-06-24 ENCOUNTER — Encounter: Payer: Self-pay | Admitting: Surgery

## 2013-06-24 VITALS — BP 135/78 | HR 96 | Resp 20 | Ht 59.0 in | Wt 138.0 lb

## 2013-06-24 DIAGNOSIS — Z902 Acquired absence of lung [part of]: Secondary | ICD-10-CM

## 2013-06-24 DIAGNOSIS — C3490 Malignant neoplasm of unspecified part of unspecified bronchus or lung: Secondary | ICD-10-CM

## 2013-06-24 DIAGNOSIS — Z9889 Other specified postprocedural states: Secondary | ICD-10-CM

## 2013-06-24 DIAGNOSIS — I359 Nonrheumatic aortic valve disorder, unspecified: Secondary | ICD-10-CM

## 2013-06-24 DIAGNOSIS — I2510 Atherosclerotic heart disease of native coronary artery without angina pectoris: Secondary | ICD-10-CM

## 2013-06-24 DIAGNOSIS — R918 Other nonspecific abnormal finding of lung field: Secondary | ICD-10-CM

## 2013-06-24 LAB — LIPID PANEL
CHOL/HDL RATIO: 4
Cholesterol: 211 mg/dL — ABNORMAL HIGH (ref 0–200)
HDL: 53.3 mg/dL (ref >39.00)
LDL CALC: 130 mg/dL — AB (ref 0–99)
Triglycerides: 138 mg/dL (ref 0.0–149.0)
VLDL: 27.6 mg/dL (ref 0.0–40.0)

## 2013-06-24 LAB — ALT: ALT: 17 U/L (ref 0–35)

## 2013-06-24 NOTE — Progress Notes (Signed)
Quick Note:  Preliminary report reviewed by triage nurse and sent to MD desk. ______ 

## 2013-06-24 NOTE — Progress Notes (Signed)
HPI:  Patient returns for routine postoperative follow-up having undergone right thoracotomy and right upper lobectomy on 03/02/2013. The final pathology showed T2, N0 poorly differentiated squamous cell carcinoma with 5 lymph nodes negative. There was focal visceral pleural invasion and negative surgical margins. She was seen by Dr. Julien Nordmann who did not feel that there was any indication for adjuvent therapy and recommended continued close follow up with a CT scan of the chest every six months for the first few years. She continues to do well. She quit smoking before surgery and had not returned. She denies any headaches or visual changes. She denies any change in appetite and has gained weight. She denies muscle or bone pain. She has had no cough or sputum production.   Current Outpatient Prescriptions  Medication Sig Dispense Refill  . acetaminophen (TYLENOL) 500 MG tablet Take 1,000 mg by mouth 3 (three) times daily as needed for pain.       [name] albuterol (PROVENTIL HFA) 108 (90 BASE) MCG/ACT inhaler Inhale 2 puffs into the lungs 2 (two) times daily as needed for wheezing or shortness of breath.       [name] aspirin EC 81 MG tablet Take 81 mg by mouth daily.      . cholecalciferol (VITAMIN D) 1000 UNITS tablet Take 1,000 Units by mouth at bedtime.       . clindamycin (CLEOCIN) 150 MG capsule 4 capsules 1 hour prior to dental  4 capsule  1  . clopidogrel (PLAVIX) 75 MG tablet Take 1 tablet (75 mg total) by mouth daily.  90 tablet  3  . Coenzyme Q10 (CO Q 10) 100 MG CAPS Take 100 mg by mouth at bedtime.       . lansoprazole (PREVACID) 30 MG capsule Take 1 capsule (30 mg total) by mouth daily.      [name] NASAL SALINE NA Place 1 spray into the nose daily as needed.       . nitroGLYCERIN (NITROSTAT) 0.4 MG SL tablet Place 1 tablet (0.4 mg total) under the tongue every 5 (five) minutes as needed for chest pain.  25 tablet  4  . OVER THE COUNTER MEDICATION Take 1 tablet by mouth daily with lunch. [facility]  Menopause support      . ranitidine (ZANTAC) 150 MG tablet Take 150 mg by mouth as needed for heartburn.      . rosuvastatin (CRESTOR) 10 MG tablet 1 TABLET 3 X A WEEK  21 tablet  0   No current facility-administered medications for this visit.     Physical Exam: BP 135/78  Pulse 96  Resp 20  Ht 4\' 11"  (1.499 m)  Wt 138 lb (62.596 kg)  BMI 27.86 kg/m2  SpO2 95%  LMP 10/04/1999 She looks well There is no cervical or supraclavicular adenopathy Lungs are clear Heart exam shows a regular rate and rhythm with normal heart sounds. Abdominal exam is benign  Diagnostic Tests:  CLINICAL DATA: History of aortic stenosis, Coronary artery stents,  right lung surgery for lung carcinoma in 2014  EXAM:  CHEST 2 VIEW  COMPARISON: Chest x-ray of 03/25/2013  FINDINGS:  No active infiltrate or effusion is seen. Chronic elevation of the  right hemidiaphragm is noted. There is a rounded opacity overlying  the anterior right fifth rib which is not seen previously. This may  simply represent callus arounda healed rib fracture, but an  overlapping pulmonary nodule cannot be excluded. In view of the  patient's history, CT  of the chest may be helpful to assess further.  No abnormality of the thoracic spine is noted.  IMPRESSION:  1. No active infiltrate or effusion.  2. Nodular opacity overlying the anterior right fifth rib may  represent callus around healed rib fracture, but a pulmonary nodule  cannot be excluded. Consider CT of the chest to assess further if  warranted.  Electronically Signed  By: Ivar Drape M.D.  On: 06/24/2013 11:03   Impression:  She is doing well overall. There is a nodular opacity overlying the anterior right fifth rib that could be nothing but a pulmonary nodule can't be excluded. Since she had visceral pleural invasion I think it would be best to proceed with a CT scan of the chest to evaluate this further. I discussed this with the patient and her son and reviewed  the scans with them.  Plan:  I will schedule a chest CT scan in the next week and will see her back in the office to review the results.

## 2013-06-26 ENCOUNTER — Ambulatory Visit
Admission: RE | Admit: 2013-06-26 | Discharge: 2013-06-26 | Disposition: A | Discharge status: Home / Self Care | Payer: BC Managed Care – PPO | Source: Ambulatory Visit | Attending: Surgery | Admitting: Surgery

## 2013-06-26 DIAGNOSIS — R918 Other nonspecific abnormal finding of lung field: Secondary | ICD-10-CM

## 2013-07-01 ENCOUNTER — Encounter: Payer: Self-pay | Admitting: Surgery

## 2013-07-01 ENCOUNTER — Ambulatory Visit (INDEPENDENT_AMBULATORY_CARE_PROVIDER_SITE_OTHER): Payer: BC Managed Care – PPO | Admitting: Surgery

## 2013-07-01 VITALS — BP 152/77 | HR 92 | Resp 20 | Ht 59.0 in | Wt 138.0 lb

## 2013-07-01 DIAGNOSIS — Z9889 Other specified postprocedural states: Secondary | ICD-10-CM

## 2013-07-01 DIAGNOSIS — Z902 Acquired absence of lung [part of]: Secondary | ICD-10-CM

## 2013-07-01 DIAGNOSIS — C3490 Malignant neoplasm of unspecified part of unspecified bronchus or lung: Secondary | ICD-10-CM

## 2013-07-01 NOTE — Progress Notes (Signed)
HPI:  She returns today to review the results of her CT scan of the chest done to evaluate a nodular opacity overlying the anterior right fifth rib. She still has some intermittent right lateral chest pains but this has improved over time.  Current Outpatient Prescriptions  Medication Sig Dispense Refill  . acetaminophen (TYLENOL) 500 MG tablet Take 1,000 mg by mouth 3 (three) times daily as needed for pain.       [name] albuterol (PROVENTIL HFA) 108 (90 BASE) MCG/ACT inhaler Inhale 2 puffs into the lungs 2 (two) times daily as needed for wheezing or shortness of breath.       [name] aspirin EC 81 MG tablet Take 81 mg by mouth daily.      . cholecalciferol (VITAMIN D) 1000 UNITS tablet Take 1,000 Units by mouth at bedtime.       . clindamycin (CLEOCIN) 150 MG capsule 4 capsules 1 hour prior to dental  4 capsule  1  . clopidogrel (PLAVIX) 75 MG tablet Take 1 tablet (75 mg total) by mouth daily.  90 tablet  3  . Coenzyme Q10 (CO Q 10) 100 MG CAPS Take 100 mg by mouth at bedtime.       . lansoprazole (PREVACID) 30 MG capsule Take 1 capsule (30 mg total) by mouth daily.      [name] NASAL SALINE NA Place 1 spray into the nose daily as needed.       . nitroGLYCERIN (NITROSTAT) 0.4 MG SL tablet Place 1 tablet (0.4 mg total) under the tongue every 5 (five) minutes as needed for chest pain.  25 tablet  4  . OVER THE COUNTER MEDICATION Take 1 tablet by mouth daily with lunch. [facility] Menopause support      . ranitidine (ZANTAC) 150 MG tablet Take 150 mg by mouth as needed for heartburn.      . rosuvastatin (CRESTOR) 10 MG tablet 1 TABLET 3 X A WEEK  21 tablet  0   No current facility-administered medications for this visit.     Physical Exam: BP 152/77  Pulse 92  Resp 20  Ht 4\' 11"  (1.499 m)  Wt 138 lb (62.596 kg)  BMI 27.86 kg/m2  SpO2 95%  LMP 10/04/1999 Lungs are clear The right chest incision looks good.   Diagnostic Tests:  CLINICAL DATA: New nodular opacity at the right lung base on chest    x-ray  EXAM:  CT CHEST WITHOUT CONTRAST  TECHNIQUE:  Multidetector CT imaging of the chest was performed following the  standard protocol without IV contrast.  COMPARISON: Chest x-ray of 06/24/2013 and 03/25/2013  FINDINGS:  On bone window images, the nodular opacity noted on recent chest  x-ray represents callus around a healing fracture of the right  anterior lateral fifth rib. On lung window images, changes of right  upper lobectomy are noted with scarring in the anterior right upper  lobe. However, no pulmonary nodule is seen. The central airway is  patent. No pleural effusion is noted. Chronic changes are noted  posteriorly particularly in the right upper lung field with scarring  present. No effusion is noted.  On soft tissue window images, the thyroid gland is unremarkable. On  this unenhanced study there are a few small mediastinal lymph nodes  present none of which are pathologically enlarged. Multiple coronary  artery stents are noted and there is aortic arch calcification  present. The upper abdomen is unremarkable.  IMPRESSION:  1. The nodular opacity noted on  recent chest x-ray represents callus  around a healing fracture of the anterior right lateral fifth rib.  No lung nodule is seen.  2. Changes of right upper lobectomy.  3. Multiple coronary artery stent are noted.  Electronically Signed  By: Ivar Drape M.D.  On: 06/26/2013 15:58   Impression:  The nodular opacity seen on CXR is callus around a healing rib fracture of the anterolateral right fifth rib. There is no lung nodule and no mediastinal or hilar adenopathy.  Plan:  She has a follow up appt with Dr. Julien Nordmann in July and will not need a repeat CT at that time. She will call to cancel it. She will continue to follow up with him and I will be happy to see her back if the need arises.

## 2013-07-02 ENCOUNTER — Encounter: Payer: Self-pay | Admitting: *Deleted

## 2013-07-02 ENCOUNTER — Other Ambulatory Visit: Payer: Self-pay | Admitting: *Deleted

## 2013-07-02 DIAGNOSIS — C3410 Malignant neoplasm of upper lobe, unspecified bronchus or lung: Secondary | ICD-10-CM

## 2013-07-02 NOTE — [facility] Oncology Navigator Note (Signed)
Patient called to report that she had seen Dr. Cyndia Bent yesterday and released.  She was relieved that the abnormality on her scan was not a recurrence but healing broken rib.  Patient reports that she is doing very well.  She continues with some pain at the site of the broken rib and surgical incision but it continues to improve.  Patient called to clarify her appointments with Dr. Earlie Server.  After speaking with Dr. Earlie Server, we rescheduled her July appointment for October following her next CT scan.  Patient denied any other questions or concerns at this time.  I encouraged her to call me for any needs.

## 2013-07-08 ENCOUNTER — Telehealth: Payer: Self-pay | Admitting: Internal Medicine

## 2013-07-08 NOTE — Telephone Encounter (Signed)
S/w the pt and she is aware of the oct 2015 appt calendar

## 2013-07-09 ENCOUNTER — Encounter: Payer: Self-pay | Admitting: *Deleted

## 2013-07-09 ENCOUNTER — Other Ambulatory Visit: Payer: Self-pay | Admitting: Cardiology

## 2013-07-09 DIAGNOSIS — I2510 Atherosclerotic heart disease of native coronary artery without angina pectoris: Secondary | ICD-10-CM

## 2013-07-09 NOTE — [facility] Oncology Navigator Note (Signed)
Patient received notification of her next appointments with Dr. Julien Nordmann which includes an appointment for labs on 12/31/13. Patient is scheduled to have labs done on 08/25/13 for her PCP.  She called to ask if she would need to have the labs repeated.  I consulted Dr. Julien Nordmann who felt she would need new labs.  I called patient back to inform her.  She reports that she is doing very well.  I encouraged her to call me for any questions or needs.

## 2013-07-28 ENCOUNTER — Ambulatory Visit (INDEPENDENT_AMBULATORY_CARE_PROVIDER_SITE_OTHER): Payer: BC Managed Care – PPO | Admitting: Interventional Cardiology

## 2013-07-28 ENCOUNTER — Encounter: Payer: Self-pay | Admitting: Interventional Cardiology

## 2013-07-28 ENCOUNTER — Telehealth: Payer: Self-pay | Admitting: Interventional Cardiology

## 2013-07-28 VITALS — BP 118/80 | HR 96 | Ht 59.5 in | Wt 141.4 lb

## 2013-07-28 DIAGNOSIS — I350 Nonrheumatic aortic (valve) stenosis: Secondary | ICD-10-CM

## 2013-07-28 DIAGNOSIS — I359 Nonrheumatic aortic valve disorder, unspecified: Secondary | ICD-10-CM

## 2013-07-28 DIAGNOSIS — E782 Mixed hyperlipidemia: Secondary | ICD-10-CM

## 2013-07-28 DIAGNOSIS — E669 Obesity, unspecified: Secondary | ICD-10-CM

## 2013-07-28 DIAGNOSIS — I2510 Atherosclerotic heart disease of native coronary artery without angina pectoris: Secondary | ICD-10-CM

## 2013-07-28 NOTE — Patient Instructions (Signed)
Your physician recommends that you continue on your current medications as directed. Please refer to the Current Medication list given to you today.  Your physician wants you to follow-up in: 6 months with Dr. Varanasi.  You will receive a reminder letter in the mail two months in advance. If you don't receive a letter, please call our office to schedule the follow-up appointment.  

## 2013-07-28 NOTE — Telephone Encounter (Signed)
Patient with h/o CAD and would like to see LDL at least < 100 mg/dL. Meds: On crestor 10 mg tiw  Unable to tolerate other statins, and got muscle aches with Crestor 10 mg four times per week, and had to reduce back to three times per week.  Zetia caused GI upset.  Spoke with patient and she would like to get on one of the PCSK-9 inhibitor agents when it hits the market.  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  She had a cancerous mass in her lungs which was removed surgically (curative surgery), so in case insurance doesn't cover it, we may be able to get her into [facility].  Will call her in August or when these agents become available.

## 2013-07-28 NOTE — Progress Notes (Signed)
Patient ID: Teresa [name], female   DOB: [birth year], 63 y.o.   MRN: [id number] Patient ID: Teresa [name], female   DOB: 01/07/1951, 63 y.o.   MRN: [id number]     [name], [address] [place], [place]  [address] Phone: [phone] Fax:  [phone]  Date:  07/28/2013   ID:  Marjo, Grosvenor 1950-07-24, MRN [id number]  PCP:  Horton Finer, MD      History of Present Illness: Teresa [name] is a 63 y.o. female who has had a complex left main , circumflex , LAD stenting after a dissection during PCI. She had PCI of the RCA complicated by dissection and required 3 stents. She had successful lung surgery to remove a malignancy. She is nonverbal well since that time. She has not had problems with regular walking. She has not had any excessive shortness of breath either.  CAD/ASCVD:  Denies :  Chest pain Dizziness.  Dyspnea on exertion.  Fatigue.  Nitroglycerin.  Orthopnea.  Palpitations.  She has a headache since starting isosorbide.  The isosorbide was subsequently stopped.    Wt Readings from Last 3 Encounters:  07/28/13 141 lb 6.4 oz (64.139 kg)  07/01/13 138 lb (62.596 kg)  06/24/13 138 lb (62.596 kg)     Past Medical History  Diagnosis Date  . GERD (gastroesophageal reflux disease)   . Hypercholesterolemia   . Seasonal allergies   . Tobacco abuse   . Cataract     "just the beginnings on the right" (02/27/2013)  . Aortic valve stenosis, moderate   . Heart murmur   . Mass of lung     "small tumor RUL; they are watching it" (02/27/2013)  . Cellulitis 06/20/2012    "dog bite; right forearm" (06/24/2012)  . Nonspecific abnormal unspecified cardiovascular function study   . Peripheral vascular disease, unspecified   . CAD (coronary artery disease), native coronary artery   . Other and unspecified angina pectoris   . Myocardial infarction     "think dr said I've had 2 silent one" (02/27/2013)  . COPD (chronic obstructive pulmonary disease)    "little bit" (02/27/2013)  . Pneumonia 1990's    "once" (02/27/2013)  . Chronic bronchitis     "used to have it alot; haven't had it in a long time" (02/27/2013)  . Exertional shortness of breath     "sometimes" (02/27/2013)  . Arthritis     "across my hips; buttocks; comes w/the weather" (02/27/2013)  . Tachycardia, unspecified     Current Outpatient Prescriptions  Medication Sig Dispense Refill  . acetaminophen (TYLENOL) 500 MG tablet Take 1,000 mg by mouth 3 (three) times daily as needed for pain.       [name] albuterol (PROVENTIL HFA) 108 (90 BASE) MCG/ACT inhaler Inhale 2 puffs into the lungs 2 (two) times daily as needed for wheezing or shortness of breath.       [name] aspirin EC 81 MG tablet Take 81 mg by mouth daily.      . cholecalciferol (VITAMIN D) 1000 UNITS tablet Take 1,000 Units by mouth at bedtime.       . clindamycin (CLEOCIN) 150 MG capsule 4 capsules 1 hour prior to dental  4 capsule  1  . clopidogrel (PLAVIX) 75 MG tablet Take 1 tablet (75 mg total) by mouth daily.  90 tablet  3  . Coenzyme Q10 (CO Q 10) 100 MG CAPS Take 100 mg by mouth at bedtime.       [name]  lansoprazole (PREVACID) 30 MG capsule Take 1 capsule (30 mg total) by mouth daily.      [name] NASAL SALINE NA Place 1 spray into the nose daily as needed.       . nitroGLYCERIN (NITROSTAT) 0.4 MG SL tablet Place 1 tablet (0.4 mg total) under the tongue every 5 (five) minutes as needed for chest pain.  25 tablet  4  . OVER THE COUNTER MEDICATION Take 1 tablet by mouth daily with lunch. [facility] Menopause support      . ranitidine (ZANTAC) 150 MG tablet Take 150 mg by mouth as needed for heartburn.      . rosuvastatin (CRESTOR) 10 MG tablet 1 TABLET 3 X A WEEK  21 tablet  0   No current facility-administered medications for this visit.    Allergies:    Allergies  Allergen Reactions  . Azithromycin Shortness Of Breath  . Ceclor [Cefaclor] Shortness Of Breath  . Morphine And Related     Terrible headache  . Penicillins Other  (See Comments)    Unknown from childhood  . Vicodin [Hydrocodone-Acetaminophen] Nausea And Vomiting  . Septra [Sulfamethoxazole-Trimethoprim] Rash  . Adhesive [Tape] Other (See Comments)    Redness and swelling - use paper tape  . Doxycycline Other (See Comments)    Redness on the face    Social History:  The patient  reports that she quit smoking about 9 months ago. Her smoking use included Cigarettes. She has a 20 pack-year smoking history. She has never used smokeless tobacco. She reports that she does not drink alcohol or use illicit drugs.   Family History:  The patient's family history includes Alcohol abuse in her father.   ROS:  Please see the history of present illness.  No nausea, vomiting.  No fevers, chills.  No focal weakness.  No dysuria.    All other systems reviewed and negative. CP with moving in certain directions since the lung surgery.   PHYSICAL EXAM: VS:  BP 118/80  Pulse 96  Ht 4' 11.5" (1.511 m)  Wt 141 lb 6.4 oz (64.139 kg)  BMI 28.09 kg/m2  LMP 10/04/1999 Well nourished, well developed, in no acute distress HEENT: normal Neck: no JVD, no carotid bruits Cardiac:  normal S1, S2; RRR; 2/6 systolc Lungs:  clear to auscultation bilaterally, no wheezing, rhonchi or rales Abd: soft, nontender, no hepatomegaly Ext: no edema Skin: warm and dry Neuro:   no focal abnormalities noted     ASSESSMENT AND PLAN:  1. CAD: P2Y12 testing showed Plavix was adequate antiplatelet therapy. No further chest discomfort since RCA stents. Left-sided stents were widely patent. Her lung cancer surgery was done in December 2014.She had a moderate lesion in the proximal right coronary artery but it did not appear to be hemodynamically significant.  She has restarted her DAPT without bleeding problems. Tolerated lung surgery well. Activity restrictions lifted. 2. Hyperlipidemia: Last LDL was 136 while she is on Crestor 10 mg 3 times a week. Increased crestor 4x/week but she did not  tolerate this.  Last LDL 130.  3. Aortic stenosis: Mild to moderate in the past.  4. Working on weight loss.  Gained weight after stopping smoking.     Signed, Mina Marble, MD, [facility] 07/28/2013 1:37 PM

## 2013-07-28 NOTE — Telephone Encounter (Signed)
?   PCSK-9 inhibitor?

## 2013-07-31 ENCOUNTER — Other Ambulatory Visit: Payer: BC Managed Care – PPO

## 2013-08-20 ENCOUNTER — Other Ambulatory Visit: Payer: Self-pay | Admitting: Gynecology

## 2013-08-21 LAB — CYTOLOGY - PAP

## 2013-09-22 ENCOUNTER — Other Ambulatory Visit: Payer: BC Managed Care – PPO

## 2013-09-22 ENCOUNTER — Ambulatory Visit (HOSPITAL_COMMUNITY): Payer: BC Managed Care – PPO

## 2013-09-29 ENCOUNTER — Ambulatory Visit: Payer: BC Managed Care – PPO | Admitting: Internal Medicine

## 2013-09-29 ENCOUNTER — Other Ambulatory Visit: Payer: BC Managed Care – PPO

## 2013-11-10 ENCOUNTER — Other Ambulatory Visit (INDEPENDENT_AMBULATORY_CARE_PROVIDER_SITE_OTHER): Payer: BC Managed Care – PPO

## 2013-11-10 DIAGNOSIS — I2510 Atherosclerotic heart disease of native coronary artery without angina pectoris: Secondary | ICD-10-CM

## 2013-11-10 LAB — HEPATIC FUNCTION PANEL
ALT: 13 U/L (ref 0–35)
AST: 18 U/L (ref 0–37)
Albumin: 3.9 g/dL (ref 3.5–5.2)
Alkaline Phosphatase: 58 U/L (ref 39–117)
BILIRUBIN DIRECT: 0 mg/dL (ref 0.0–0.3)
Total Bilirubin: 0.5 mg/dL (ref 0.2–1.2)
Total Protein: 7.5 g/dL (ref 6.0–8.3)

## 2013-11-10 LAB — LIPID PANEL
CHOL/HDL RATIO: 4
Cholesterol: 198 mg/dL (ref 0–200)
HDL: 53.8 mg/dL (ref >39.00)
LDL Cholesterol: 117 mg/dL — ABNORMAL HIGH (ref 0–99)
NONHDL: 144.2
TRIGLYCERIDES: 136 mg/dL (ref 0.0–149.0)
VLDL: 27.2 mg/dL (ref 0.0–40.0)

## 2013-11-16 ENCOUNTER — Telehealth: Payer: Self-pay | Admitting: Cardiology

## 2013-11-16 DIAGNOSIS — E782 Mixed hyperlipidemia: Secondary | ICD-10-CM

## 2013-11-16 MED ORDER — ROSUVASTATIN CALCIUM 10 MG PO TABS: ORAL_TABLET | ORAL | Status: DC

## 2013-11-16 NOTE — Telephone Encounter (Signed)
Pt notified, meds updated and labs ordered.

## 2013-11-16 NOTE — Telephone Encounter (Signed)
Message copied by Alcario Drought on Mon Nov 16, 2013  2:57 PM ------      Message from: Jettie Booze      Created: Tue Nov 10, 2013  3:15 PM       LDL above target.  WOuld increase Crestor to 4 x/week. Recheck lipid liver in 3 months. ------

## 2013-11-26 ENCOUNTER — Other Ambulatory Visit: Payer: Self-pay

## 2013-11-26 MED ORDER — VITAMIN D 1000 UNITS PO TABS: 1000.0000 [IU] | ORAL_TABLET | Freq: Every day | ORAL | Status: DC

## 2013-11-30 ENCOUNTER — Telehealth: Payer: Self-pay | Admitting: Interventional Cardiology

## 2013-11-30 NOTE — Telephone Encounter (Signed)
Will forward to Amy S

## 2013-11-30 NOTE — Telephone Encounter (Signed)
New message    Patient is aware amy is off today would like for her to call tomorrow to discuss medication.

## 2013-12-01 NOTE — Telephone Encounter (Signed)
Spoke with pt and answered her question regarding viatmin D.

## 2013-12-03 ENCOUNTER — Other Ambulatory Visit: Payer: Self-pay | Admitting: *Deleted

## 2013-12-03 MED ORDER — CLOPIDOGREL BISULFATE 75 MG PO TABS: 75.0000 mg | ORAL_TABLET | Freq: Every day | ORAL | Status: DC

## 2013-12-29 ENCOUNTER — Ambulatory Visit (HOSPITAL_COMMUNITY)
Admission: RE | Admit: 2013-12-29 | Discharge: 2013-12-29 | Disposition: A | Discharge status: Home / Self Care | Payer: BC Managed Care – PPO | Source: Ambulatory Visit | Attending: Internal Medicine | Admitting: Internal Medicine

## 2013-12-29 ENCOUNTER — Encounter (HOSPITAL_COMMUNITY): Payer: Self-pay

## 2013-12-29 DIAGNOSIS — Z902 Acquired absence of lung [part of]: Secondary | ICD-10-CM | POA: Diagnosis not present

## 2013-12-29 DIAGNOSIS — Z85118 Personal history of other malignant neoplasm of bronchus and lung: Secondary | ICD-10-CM | POA: Diagnosis not present

## 2013-12-29 DIAGNOSIS — C3410 Malignant neoplasm of upper lobe, unspecified bronchus or lung: Secondary | ICD-10-CM

## 2013-12-29 DIAGNOSIS — R079 Chest pain, unspecified: Secondary | ICD-10-CM | POA: Insufficient documentation

## 2013-12-29 LAB — POCT I-STAT CREATININE: CREATININE: 0.8 mg/dL (ref 0.50–1.10)

## 2013-12-29 MED ORDER — IOHEXOL 300 MG/ML  SOLN
80.0000 mL | Freq: Once | INTRAMUSCULAR | Status: AC | PRN
  Administered 2013-12-29: 80 mL via INTRAVENOUS

## 2013-12-31 ENCOUNTER — Other Ambulatory Visit (HOSPITAL_BASED_OUTPATIENT_CLINIC_OR_DEPARTMENT_OTHER): Payer: BC Managed Care – PPO

## 2013-12-31 ENCOUNTER — Encounter: Payer: Self-pay | Admitting: Internal Medicine

## 2013-12-31 ENCOUNTER — Telehealth: Payer: Self-pay | Admitting: Internal Medicine

## 2013-12-31 ENCOUNTER — Ambulatory Visit (HOSPITAL_BASED_OUTPATIENT_CLINIC_OR_DEPARTMENT_OTHER): Payer: BC Managed Care – PPO | Admitting: Internal Medicine

## 2013-12-31 VITALS — BP 149/91 | HR 61 | Temp 98.4°F | Resp 18 | Ht 59.0 in | Wt 151.1 lb

## 2013-12-31 DIAGNOSIS — C3411 Malignant neoplasm of upper lobe, right bronchus or lung: Secondary | ICD-10-CM

## 2013-12-31 DIAGNOSIS — C3410 Malignant neoplasm of upper lobe, unspecified bronchus or lung: Secondary | ICD-10-CM

## 2013-12-31 LAB — CBC WITH DIFFERENTIAL/PLATELET
BASO%: 0.4 % (ref 0.0–2.0)
BASOS ABS: 0 10*3/uL (ref 0.0–0.1)
EOS%: 3.1 % (ref 0.0–7.0)
Eosinophils Absolute: 0.2 10*3/uL (ref 0.0–0.5)
HCT: 37.9 % (ref 34.8–46.6)
HEMOGLOBIN: 12.2 g/dL (ref 11.6–15.9)
LYMPH#: 1.9 10*3/uL (ref 0.9–3.3)
LYMPH%: 39.3 % (ref 14.0–49.7)
MCH: 28.4 pg (ref 25.1–34.0)
MCHC: 32.2 g/dL (ref 31.5–36.0)
MCV: 88.3 fL (ref 79.5–101.0)
MONO#: 0.5 10*3/uL (ref 0.1–0.9)
MONO%: 10.2 % (ref 0.0–14.0)
NEUT#: 2.3 10*3/uL (ref 1.5–6.5)
NEUT%: 47 % (ref 38.4–76.8)
Platelets: 276 10*3/uL (ref 145–400)
RBC: 4.29 10*6/uL (ref 3.70–5.45)
RDW: 14 % (ref 11.2–14.5)
WBC: 4.9 10*3/uL (ref 3.9–10.3)

## 2013-12-31 LAB — COMPREHENSIVE METABOLIC PANEL (CC13)
ALK PHOS: 80 U/L (ref 40–150)
ALT: 16 U/L (ref 0–55)
AST: 18 U/L (ref 5–34)
Albumin: 3.9 g/dL (ref 3.5–5.0)
Anion Gap: 9 mEq/L (ref 3–11)
BUN: 10 mg/dL (ref 7.0–26.0)
CALCIUM: 9.6 mg/dL (ref 8.4–10.4)
CO2: 26 mEq/L (ref 22–29)
Chloride: 106 mEq/L (ref 98–109)
Creatinine: 0.7 mg/dL (ref 0.6–1.1)
Glucose: 78 mg/dl (ref 70–140)
POTASSIUM: 4.2 meq/L (ref 3.5–5.1)
SODIUM: 141 meq/L (ref 136–145)
TOTAL PROTEIN: 7.3 g/dL (ref 6.4–8.3)
Total Bilirubin: 0.52 mg/dL (ref 0.20–1.20)

## 2013-12-31 NOTE — Progress Notes (Signed)
[place] [phone]   [phone]  OFFICE PROGRESS NOTE  Horton Finer, MD [address], [address] [place] [address]  DIAGNOSIS: Stage IB (T2a., N0, M0) poorly differentiated squamous cell carcinoma, diagnosed in December of 2014.  PRIOR THERAPY: Flexible video bronchoscopy, right upper lobectomy with mediastinal lymph node dissection under the care of Dr. Cyndia Bent.   CURRENT THERAPY: Observation.  INTERVAL HISTORY: Teresa [name] 63 y.o. female returns to the clinic today for six-month followup visit. The patient is feeling fine today with no specific complaints. She denied having any significant chest pain, shortness breath, cough or hemoptysis. She has no fever or chills, no nausea or vomiting. The patient had repeat CT scan of the chest performed recently and she is here for evaluation and discussion of her scan results.   MEDICAL HISTORY: Past Medical History  Diagnosis Date  . GERD (gastroesophageal reflux disease)   . Hypercholesterolemia   . Seasonal allergies   . Tobacco abuse   . Cataract     "just the beginnings on the right" (02/27/2013)  . Aortic valve stenosis, moderate   . Heart murmur   . Mass of lung     "small tumor RUL; they are watching it" (02/27/2013)  . Cellulitis 06/20/2012    "dog bite; right forearm" (06/24/2012)  . Nonspecific abnormal unspecified cardiovascular function study   . Peripheral vascular disease, unspecified   . CAD (coronary artery disease), native coronary artery   . Other and unspecified angina pectoris   . Myocardial infarction     "think dr said I've had 2 silent one" (02/27/2013)  . COPD (chronic obstructive pulmonary disease)     "little bit" (02/27/2013)  . Pneumonia 1990's    "once" (02/27/2013)  . Chronic bronchitis     "used to have it alot; haven't had it in a long time" (02/27/2013)  . Exertional shortness of breath     "sometimes" (02/27/2013)  . Arthritis       "across my hips; buttocks; comes w/the weather" (02/27/2013)  . Tachycardia, unspecified     ALLERGIES:  is allergic to azithromycin; ceclor; morphine and related; penicillins; vicodin; septra; adhesive; and doxycycline.  MEDICATIONS:  Current Outpatient Prescriptions  Medication Sig Dispense Refill  . albuterol (PROVENTIL HFA) 108 (90 BASE) MCG/ACT inhaler Inhale 2 puffs into the lungs 2 (two) times daily as needed for wheezing or shortness of breath.       [name] aspirin EC 81 MG tablet Take 81 mg by mouth daily.      . cholecalciferol (VITAMIN D) 1000 UNITS tablet Take 1 tablet (1,000 Units total) by mouth at bedtime.  90 tablet  0  . clopidogrel (PLAVIX) 75 MG tablet Take 1 tablet (75 mg total) by mouth daily.  90 tablet  0  . Coenzyme Q10 (CO Q 10) 100 MG CAPS Take 100 mg by mouth at bedtime.       . lansoprazole (PREVACID) 30 MG capsule Take 1 capsule (30 mg total) by mouth daily.      [name] NASAL SALINE NA Place 1 spray into the nose daily as needed.       [name] OVER THE COUNTER MEDICATION Take 1 tablet by mouth daily with lunch. [facility] Menopause support      . ranitidine (ZANTAC) 150 MG tablet Take 150 mg by mouth as needed for heartburn.      . rosuvastatin (CRESTOR) 10 MG tablet 1 TABLET 4 X A WEEK  21 tablet  0  . acetaminophen (TYLENOL) 500 MG tablet Take 1,000 mg by mouth 3 (three) times daily as needed for pain.       . nitroGLYCERIN (NITROSTAT) 0.4 MG SL tablet Place 1 tablet (0.4 mg total) under the tongue every 5 (five) minutes as needed for chest pain.  25 tablet  4   No current facility-administered medications for this visit.    SURGICAL HISTORY:  Past Surgical History  Procedure Laterality Date  . Mouth surgery  2010?    "for bone loss" (06/24/2012)  . Video bronchoscopy  09/12/2011    Procedure: VIDEO BRONCHOSCOPY WITH FLUORO;  Surgeon: Tanda Rockers, MD;  Location: [facility];  Service: Cardiopulmonary;  Laterality: Bilateral;  . Tonsillectomy  1960  . Ganglion cyst  excision Left 1975    "wrist"  . Coronary angioplasty with stent placement  10/2012; 12/08/2012    "7 + 3" (02/27/2013)  . Flexible bronchoscopy N/A 03/02/2013    Procedure: FLEXIBLE BRONCHOSCOPY;  Surgeon: Gaye Pollack, MD;  Location: [place];  Service: Thoracic;  Laterality: N/A;  . Thoracotomy/lobectomy Right 03/02/2013    Procedure: Right Video Assisted Thoracoscopy/Thoracotomy with upper Lobectomy;  Surgeon: Gaye Pollack, MD;  Location: [facility] OR;  Service: Thoracic;  Laterality: Right;  Right Lung Upper  Lobectomy     REVIEW OF SYSTEMS:  A comprehensive review of systems was negative.   PHYSICAL EXAMINATION: General appearance: alert, cooperative and no distress Head: Normocephalic, without obvious abnormality, atraumatic Neck: no adenopathy, no JVD, supple, symmetrical, trachea midline and thyroid not enlarged, symmetric, no tenderness/mass/nodules Lymph nodes: Cervical, supraclavicular, and axillary nodes normal. Resp: clear to auscultation bilaterally Back: symmetric, no curvature. ROM normal. No CVA tenderness. Cardio: regular rate and rhythm, S1, S2 normal, no murmur, click, rub or gallop GI: soft, non-tender; bowel sounds normal; no masses,  no organomegaly Extremities: extremities normal, atraumatic, no cyanosis or edema  ECOG PERFORMANCE STATUS: 1 - Symptomatic but completely ambulatory  Blood pressure 149/91, pulse 61, temperature 98.4 F (36.9 C), temperature source Oral, resp. rate 18, height 4\' 11"  (1.499 m), weight 151 lb 1.6 oz (68.539 kg), last menstrual period 10/04/1999, SpO2 96.00%.  LABORATORY DATA: Lab Results  Component Value Date   WBC 4.9 12/31/2013   HGB 12.2 12/31/2013   HCT 37.9 12/31/2013   MCV 88.3 12/31/2013   PLT 276 12/31/2013      Chemistry      Component Value Date/Time   NA 141 12/31/2013 0940   NA 140 03/05/2013 0450   K 4.2 12/31/2013 0940   K 4.1 03/05/2013 0450   CL 101 03/05/2013 0450   CO2 26 12/31/2013 0940   CO2 29 03/05/2013 0450    BUN 10.0 12/31/2013 0940   BUN 12 03/05/2013 0450   CREATININE 0.7 12/31/2013 0940   CREATININE 0.80 12/29/2013 0759      Component Value Date/Time   CALCIUM 9.6 12/31/2013 0940   CALCIUM 8.5 03/05/2013 0450   ALKPHOS 80 12/31/2013 0940   ALKPHOS 58 11/10/2013 0754   AST 18 12/31/2013 0940   AST 18 11/10/2013 0754   ALT 16 12/31/2013 0940   ALT 13 11/10/2013 0754   BILITOT 0.52 12/31/2013 0940   BILITOT 0.5 11/10/2013 0754       RADIOGRAPHIC STUDIES: Ct Chest W Contrast  12/29/2013   CLINICAL DATA:  Lung cancer 2014. Prior right upper lobectomy. Right-sided chest pain.  EXAM: CT CHEST WITH CONTRAST  TECHNIQUE: Multidetector CT imaging of the chest was  performed during intravenous contrast administration.  CONTRAST:  54mL OMNIPAQUE IOHEXOL 300 MG/ML  SOLN  COMPARISON:  06/26/2013  FINDINGS: Postsurgical changes from prior right upper lobectomy with stable appearance. No recurrent or residual mass. Postoperative scarring in the right upper lung. No suspicious pulmonary nodules. No pleural effusions.  Heart is normal size. Aorta is normal caliber. Densely calcified coronary arteries with coronary stents in place. No mediastinal, hilar, or axillary adenopathy. Chest wall soft tissues are unremarkable. Imaging into the upper abdomen shows no acute findings.  No acute bony abnormality or focal bone lesion.  IMPRESSION: Stable postoperative appearance in the right lung from right upper lobectomy. Postoperative scarring. No evidence of recurrent or metastatic disease within the chest.   Electronically Signed   By: Rolm Baptise M.D.   On: 12/29/2013 08:40    ASSESSMENT AND PLAN: this is a very pleasant 63 years old white female with history of a stage IB non-small cell lung cancer status post right upper lobectomy with lymph node dissection and has been observation was no evidence for disease recurrence. I discussed the scan results with the patient today. I recommended for her to continue on observation with  repeat CT scan of the chest in 6 months. She was advised to call immediately if she has any concerning symptoms in the interval. The patient voices understanding of current disease status and treatment options and is in agreement with the current care plan.  All questions were answered. The patient knows to call the clinic with any problems, questions or concerns. We can certainly see the patient much sooner if necessary.  Disclaimer: This note was dictated with voice recognition software. Similar sounding words can inadvertently be transcribed and may not be corrected upon review.

## 2013-12-31 NOTE — Telephone Encounter (Signed)
gv adn printed appt sched and avs for pt for Jan/Feb 2016

## 2014-01-01 ENCOUNTER — Telehealth: Payer: Self-pay | Admitting: Internal Medicine

## 2014-01-01 NOTE — Telephone Encounter (Signed)
returned pt call and confirmed appts....pt ok and aware

## 2014-01-05 ENCOUNTER — Ambulatory Visit: Payer: BC Managed Care – PPO | Admitting: Interventional Cardiology

## 2014-01-25 ENCOUNTER — Encounter: Payer: Self-pay | Admitting: *Deleted

## 2014-01-25 NOTE — [facility] Oncology Navigator Note (Signed)
Called patient to check in.  She reports that she is doing well.  She reports that she is beginning to regain her energy.  She was happy to report that her recent scan and visit with Dr. Julien Nordmann showed her findings to be stable.  We discussed that she will follow up with him again in May.  She denied any questions or concerns at this time.  I encouraged her to call me for any needs.

## 2014-01-26 ENCOUNTER — Encounter: Payer: Self-pay | Admitting: Interventional Cardiology

## 2014-01-26 ENCOUNTER — Ambulatory Visit (INDEPENDENT_AMBULATORY_CARE_PROVIDER_SITE_OTHER): Payer: BC Managed Care – PPO | Admitting: Interventional Cardiology

## 2014-01-26 VITALS — BP 100/60 | HR 89 | Ht 59.0 in | Wt 155.0 lb

## 2014-01-26 DIAGNOSIS — E669 Obesity, unspecified: Secondary | ICD-10-CM

## 2014-01-26 DIAGNOSIS — I2510 Atherosclerotic heart disease of native coronary artery without angina pectoris: Secondary | ICD-10-CM

## 2014-01-26 DIAGNOSIS — I350 Nonrheumatic aortic (valve) stenosis: Secondary | ICD-10-CM

## 2014-01-26 DIAGNOSIS — E782 Mixed hyperlipidemia: Secondary | ICD-10-CM

## 2014-01-26 NOTE — Patient Instructions (Signed)
Your physician wants you to follow-up in: 6 months with Dr. Irish Lack.  You will receive a reminder letter in the mail two months in advance. If you don't receive a letter, please call our office to schedule the follow-up appointment. You have been referred to the [facility] Gay Filler, PharmD) to discuss cholesterol lowering alternatives.

## 2014-01-26 NOTE — Progress Notes (Signed)
Patient ID: Teresa [name], female   DOB: [birth year], 63 y.o.   MRN: [id number]     [place], [address], [place]  [address] Phone: [phone] Fax:  [phone]  Date:  01/26/2014   ID:  Teresa [name], Teresa [name] [birth year], MRN [id number]  PCP:  Horton Finer, MD      History of Present Illness: Teresa [name] is a 63 y.o. female who has had a complex left main , circumflex , LAD stenting after a dissection during PCI in 8/14. She had PCI of the RCA complicated by dissection and required 3 stents. She had successful lung surgery to remove a malignancy. She is nonverbal well since that time. She has not had problems with regular walking. She has not had any excessive shortness of breath either.  CAD/ASCVD:  Denies :  Dizziness.  Dyspnea on exertion.  Fatigue.  Nitroglycerin.  Orthopnea.  Palpitations.   The isosorbide was  Stopped due to headache.   She has had a a cough.  CP with coughing.  Some left shoulder pain after flu shot.  Her NTG is expired.      Wt Readings from Last 3 Encounters:  01/26/14 155 lb (70.308 kg)  12/31/13 151 lb 1.6 oz (68.539 kg)  07/28/13 141 lb 6.4 oz (64.139 kg)     Past Medical History  Diagnosis Date  . GERD (gastroesophageal reflux disease)   . Hypercholesterolemia   . Seasonal allergies   . Tobacco abuse   . Cataract     "just the beginnings on the right" (02/27/2013)  . Aortic valve stenosis, moderate   . Heart murmur   . Mass of lung     "small tumor RUL; they are watching it" (02/27/2013)  . Cellulitis 06/20/2012    "dog bite; right forearm" (06/24/2012)  . Nonspecific abnormal unspecified cardiovascular function study   . Peripheral vascular disease, unspecified   . CAD (coronary artery disease), native coronary artery   . Other and unspecified angina pectoris   . Myocardial infarction     "think dr said I've had 2 silent one" (02/27/2013)  . COPD (chronic obstructive pulmonary disease)     "little  bit" (02/27/2013)  . Pneumonia 1990's    "once" (02/27/2013)  . Chronic bronchitis     "used to have it alot; haven't had it in a long time" (02/27/2013)  . Exertional shortness of breath     "sometimes" (02/27/2013)  . Arthritis     "across my hips; buttocks; comes w/the weather" (02/27/2013)  . Tachycardia, unspecified     Current Outpatient Prescriptions  Medication Sig Dispense Refill  . albuterol (PROVENTIL HFA) 108 (90 BASE) MCG/ACT inhaler Inhale 2 puffs into the lungs 2 (two) times daily as needed for wheezing or shortness of breath.     [name] aspirin EC 81 MG tablet Take 81 mg by mouth daily.    . cholecalciferol (VITAMIN D) 1000 UNITS tablet Take 1 tablet (1,000 Units total) by mouth at bedtime. 90 tablet 0  . clopidogrel (PLAVIX) 75 MG tablet Take 1 tablet (75 mg total) by mouth daily. 90 tablet 0  . Coenzyme Q10 (CO Q 10) 100 MG CAPS Take 100 mg by mouth at bedtime.     . lansoprazole (PREVACID) 30 MG capsule Take 1 capsule (30 mg total) by mouth daily.    [name] NASAL SALINE NA Place 1 spray into the nose daily as needed.     [name] OVER THE COUNTER MEDICATION  Take 1 tablet by mouth daily with lunch. [facility] Menopause support    . rosuvastatin (CRESTOR) 10 MG tablet 1 TABLET 4 X A WEEK 21 tablet 0  . acetaminophen (TYLENOL) 500 MG tablet Take 1,000 mg by mouth 3 (three) times daily as needed for pain.     . benzonatate (TESSALON) 100 MG capsule   0  . nitroGLYCERIN (NITROSTAT) 0.4 MG SL tablet Place 1 tablet (0.4 mg total) under the tongue every 5 (five) minutes as needed for chest pain. (Patient not taking: Reported on 01/26/2014) 25 tablet 4  . ranitidine (ZANTAC) 150 MG tablet Take 150 mg by mouth as needed for heartburn.     No current facility-administered medications for this visit.    Allergies:    Allergies  Allergen Reactions  . Azithromycin Shortness Of Breath  . Ceclor [Cefaclor] Shortness Of Breath  . Morphine And Related     Terrible headache  . Penicillins Other (See  Comments)    Unknown from childhood  . Vicodin [Hydrocodone-Acetaminophen] Nausea And Vomiting  . Septra [Sulfamethoxazole-Trimethoprim] Rash  . Adhesive [Tape] Other (See Comments)    Redness and swelling - use paper tape  . Doxycycline Other (See Comments)    Redness on the face    Social History:  The patient  reports that she quit smoking about 15 months ago. Her smoking use included Cigarettes. She has a 20 pack-year smoking history. She has never used smokeless tobacco. She reports that she does not drink alcohol or use illicit drugs.   Family History:  The patient's family history includes Alcohol abuse in her father.   ROS:  Please see the history of present illness.  No nausea, vomiting.  No fevers, chills.  No focal weakness.  No dysuria.    All other systems reviewed and negative. CP with moving in certain directions since the lung surgery.   PHYSICAL EXAM: VS:  BP 100/60 mmHg  Pulse 89  Ht 4\' 11"  (1.499 m)  Wt 155 lb (70.308 kg)  BMI 31.29 kg/m2  LMP 10/04/1999 Well nourished, well developed, in no acute distress HEENT: normal Neck: no JVD, no carotid bruits Cardiac:  normal S1, S2; RRR; 2/6 systolc Lungs:  clear to auscultation bilaterally, no wheezing, rhonchi or rales Abd: soft, nontender, no hepatomegaly Ext: no edema Skin: warm and dry Neuro:   no focal abnormalities noted Psych: normal affect    ASSESSMENT AND PLAN:  1. CAD: P2Y12 testing showed Plavix was adequate antiplatelet therapy. No further chest discomfort since RCA stents. Left-sided stents were widely patent. Her lung cancer surgery was done in December 2014.She had a moderate lesion in the proximal right coronary artery but it did not appear to be hemodynamically significant.  She has restarted her DAPT without bleeding problems. Chest and shoulder pain seem atypical for ischemic pain.   2. Hyperlipidemia: Last LDL was 136 while she is on Crestor 10 mg 3 times a week. Increased crestor 4x/week but she  did not tolerate this due to leg pain and muscle aches.Did not tolerate Zocor and lipitor due to muscle pains.  Did not tolerate Zetia due to stomach pains. Last LDL 130.  Refer to lipid clinic to see if she would be a candidate for PCSK9 inhibitor.  She would be happy to come of of crestor.  3. Aortic stenosis: Mild to moderate in the past. No sx of severe AS. 4. Working on weight loss.  Gained weight after stopping smoking.  Try to increase walking to  150 minutes/week.  Discussed the Mediterranean diet.  She has had success with a high fiber diet in the past to lose weight.   She will try that.     Signed, Mina Marble, MD, [facility] 01/26/2014 10:05 AM

## 2014-01-29 ENCOUNTER — Other Ambulatory Visit: Payer: Self-pay

## 2014-01-29 MED ORDER — NITROGLYCERIN 0.4 MG SL SUBL: 0.4000 mg | SUBLINGUAL_TABLET | SUBLINGUAL | Status: DC | PRN

## 2014-02-11 ENCOUNTER — Encounter (HOSPITAL_COMMUNITY): Payer: Self-pay | Admitting: Interventional Cardiology

## 2014-02-17 ENCOUNTER — Other Ambulatory Visit: Payer: Self-pay

## 2014-02-17 MED ORDER — CLOPIDOGREL BISULFATE 75 MG PO TABS: 75.0000 mg | ORAL_TABLET | Freq: Every day | ORAL | Status: DC

## 2014-02-22 ENCOUNTER — Other Ambulatory Visit (INDEPENDENT_AMBULATORY_CARE_PROVIDER_SITE_OTHER): Payer: BC Managed Care – PPO | Admitting: *Deleted

## 2014-02-22 ENCOUNTER — Telehealth: Payer: Self-pay | Admitting: Interventional Cardiology

## 2014-02-22 DIAGNOSIS — E782 Mixed hyperlipidemia: Secondary | ICD-10-CM

## 2014-02-22 LAB — HEPATIC FUNCTION PANEL
ALBUMIN: 4 g/dL (ref 3.5–5.2)
ALT: 18 U/L (ref 0–35)
AST: 20 U/L (ref 0–37)
Alkaline Phosphatase: 66 U/L (ref 39–117)
Bilirubin, Direct: 0 mg/dL (ref 0.0–0.3)
Total Bilirubin: 0.6 mg/dL (ref 0.2–1.2)
Total Protein: 7.5 g/dL (ref 6.0–8.3)

## 2014-02-22 LAB — LIPID PANEL
CHOL/HDL RATIO: 4
Cholesterol: 190 mg/dL (ref 0–200)
HDL: 45.5 mg/dL (ref >39.00)
LDL CALC: 115 mg/dL — AB (ref 0–99)
NonHDL: 144.5
Triglycerides: 148 mg/dL (ref 0.0–149.0)
VLDL: 29.6 mg/dL (ref 0.0–40.0)

## 2014-02-22 NOTE — Telephone Encounter (Signed)
Walk In Pt Form " [employer] Pre-Authorization" paper dropped off will hold Until Dr.Varanasi returns in office/KM

## 2014-03-02 ENCOUNTER — Encounter: Payer: Self-pay | Admitting: Pharmacist Clinician (PhC)/ Clinical Pharmacy Specialist

## 2014-03-02 ENCOUNTER — Ambulatory Visit (INDEPENDENT_AMBULATORY_CARE_PROVIDER_SITE_OTHER): Payer: BC Managed Care – PPO | Admitting: Pharmacist Clinician (PhC)/ Clinical Pharmacy Specialist

## 2014-03-02 VITALS — Ht 59.0 in | Wt 150.0 lb

## 2014-03-02 DIAGNOSIS — E782 Mixed hyperlipidemia: Secondary | ICD-10-CM

## 2014-03-02 NOTE — Progress Notes (Signed)
03/02/2014 Teresa [name] Teresa [name] Jun 01, 1950 [id number]   HPI:  Teresa [name] is a 63 y.o. female patient of Dr Irish Lack, who presents today for a lipid clinic evaluation.  Her medical history is significant for right upper lobe lobectomy last December.  She spent much of this past year in recovery, and has just in the past 3-4 months been able to increase her activity to include 30 minute walks most days of the week.  RF:  complex LM, circumflex, LAD stenting after dissection during PCI 10/2012. PCI of RCA with dissection needing 3 stents  Meds: Crestor 10 mg three times per week.    Intolerant: Crestor 10 mg 4 times per week; zetia caused stomach cramping; niacin caused flushing/cramping  Diet: pt has been trying to lose weight off/on for past few years.  Had some significant weight gain after quitting smoking (10/2012) then after lung surgery 02/2013).  Has done mostly high fiber diet, including Fiber One cereal, beans daily and much fruit.    Exercise:  Now walking 30 minutes most days of the week  Labs:  02/2014  TC 190, TG 148, HDL 45.5, LDL 115, nonHDL 144.5 11/2013  TC 198, TG 136, HDL 53.8, LDL 117, nonHDL 144.2 06/2013  TC 211, TG 138, HDL 53.3, LDL 130, nonHDL 157.7   Current Outpatient Prescriptions  Medication Sig Dispense Refill  . acetaminophen (TYLENOL) 500 MG tablet Take 1,000 mg by mouth 3 (three) times daily as needed for pain.     [name] albuterol (PROVENTIL HFA) 108 (90 BASE) MCG/ACT inhaler Inhale 2 puffs into the lungs 2 (two) times daily as needed for wheezing or shortness of breath.     [name] aspirin EC 81 MG tablet Take 81 mg by mouth daily.    . benzonatate (TESSALON) 100 MG capsule   0  . cholecalciferol (VITAMIN D) 1000 UNITS tablet Take 1 tablet (1,000 Units total) by mouth at bedtime. 90 tablet 0  . clopidogrel (PLAVIX) 75 MG tablet Take 1 tablet (75 mg total) by mouth daily. 90 tablet 0  . Coenzyme Q10 (CO Q 10) 100 MG CAPS Take 100 mg by mouth at bedtime.     .  lansoprazole (PREVACID) 30 MG capsule Take 1 capsule (30 mg total) by mouth daily.    [name] NASAL SALINE NA Place 1 spray into the nose daily as needed.     . nitroGLYCERIN (NITROSTAT) 0.4 MG SL tablet Place 1 tablet (0.4 mg total) under the tongue every 5 (five) minutes as needed for chest pain. 25 tablet 4  . OVER THE COUNTER MEDICATION Take 1 tablet by mouth daily with lunch. [facility] Menopause support    . ranitidine (ZANTAC) 150 MG tablet Take 150 mg by mouth as needed for heartburn.    . rosuvastatin (CRESTOR) 10 MG tablet 1 TABLET 4 X A WEEK (Patient taking differently: 1 TABLET 3 X A WEEK) 21 tablet 0   No current facility-administered medications for this visit.    Allergies  Allergen Reactions  . Azithromycin Shortness Of Breath  . Ceclor [Cefaclor] Shortness Of Breath  . Morphine And Related     Terrible headache  . Penicillins Other (See Comments)    Unknown from childhood  . Vicodin [Hydrocodone-Acetaminophen] Nausea And Vomiting  . Septra [Sulfamethoxazole-Trimethoprim] Rash  . Adhesive [Tape] Other (See Comments)    Redness and swelling - use paper tape  . Doxycycline Other (See Comments)    Redness on the face    Past Medical History  Diagnosis Date  . GERD (gastroesophageal reflux disease)   . Hypercholesterolemia   . Seasonal allergies   . Tobacco abuse   . Cataract     "just the beginnings on the right" (02/27/2013)  . Aortic valve stenosis, moderate   . Heart murmur   . Mass of lung     "small tumor RUL; they are watching it" (02/27/2013)  . Cellulitis 06/20/2012    "dog bite; right forearm" (06/24/2012)  . Nonspecific abnormal unspecified cardiovascular function study   . Peripheral vascular disease, unspecified   . CAD (coronary artery disease), native coronary artery   . Other and unspecified angina pectoris   . Myocardial infarction     "think dr said I've had 2 silent one" (02/27/2013)  . COPD (chronic obstructive pulmonary disease)     "little bit"  (02/27/2013)  . Pneumonia 1990's    "once" (02/27/2013)  . Chronic bronchitis     "used to have it alot; haven't had it in a long time" (02/27/2013)  . Exertional shortness of breath     "sometimes" (02/27/2013)  . Arthritis     "across my hips; buttocks; comes w/the weather" (02/27/2013)  . Tachycardia, unspecified     Height 4\' 11"  (1.499 m), weight 150 lb (68.04 kg), last menstrual period 10/04/1999.    Tommy Medal PharmD CPP [facility]

## 2014-03-02 NOTE — Patient Instructions (Signed)
Please continue to eat a high fiber diet.    Continue to take your Crestor 10 mg three times each week.  We will contact your insurance company to determine whether you can take [place] or Praluent.

## 2014-03-02 NOTE — Assessment & Plan Note (Signed)
Pt is currently on maximum tolerated statin at Crestor 10 mg three times per week.  She did try four times per week, but after 3 weeks had to cut back due to increased pain.  She admits to some muscle problems with three times per week, but is willing to continue to keep her cholesterol down.  We will determine from her insurance company whether they cover Praluent or [place] after January 1, and will start the process to get medication for her at that time.

## 2014-03-03 ENCOUNTER — Telehealth: Payer: Self-pay | Admitting: *Deleted

## 2014-03-03 NOTE — Telephone Encounter (Signed)
PA for Crestor sent to [place] [facility] [place] via fax.

## 2014-03-15 NOTE — Telephone Encounter (Signed)
PA for Crestor denied, patient has not tried and failed generic statins including atorvastatin, lovastatin, simvastatin, or pravastatin. Patient notified by insurance company.

## 2014-03-15 NOTE — Telephone Encounter (Signed)
Start pravastatin 20 mg daily.  If she fails pravastatin, would she be a candidate for a PCSK9 inhibitor?

## 2014-03-16 NOTE — Telephone Encounter (Signed)
She has failed Lipitor, Crestor, and Zetia so should be able to try PCSK-9 if she fails pravastatin.

## 2014-03-18 NOTE — Telephone Encounter (Signed)
I spoke with the patient today regarding Dr. Hassell Done recommendations. She has had severe myalgias with atorvastatin and simvastatin. She had horrible GI upset with Zetia. She states that she is currently on the Crestor and that she still has some less severe muscle pains with Crestor. She states that she saw Erasmo Downer in the lipid clinic and was waiting to hear if she would qualify for PCSK- 9. I wasn't sure if the comments left here by Gay Filler were because they had already checked on that and that she absolutely has to try and fail all the generics before she can qualify. She sounds to be intolerant to statins in general. I advised the patient I would forward to the lipid clinic and that myself or the lipid clinic would be back in touch with her. She voices understanding.

## 2014-03-18 NOTE — Telephone Encounter (Signed)
Original note did not mention intolerance to Crestor.  Insurance does not usually make pts fail all generic statins first if they are on one but if she cannot take any statin, it helps build the case.  I do not have any experience with [place] to know all of their requirements.  Trying to manage healthcare spending appropriately by using $4 statin before $15,000 a year drug.  Will forward to [facility] for review.

## 2014-03-19 ENCOUNTER — Other Ambulatory Visit: Payer: Self-pay | Admitting: Pharmacist Clinician (PhC)/ Clinical Pharmacy Specialist

## 2014-03-30 ENCOUNTER — Other Ambulatory Visit: Payer: BC Managed Care – PPO

## 2014-04-06 ENCOUNTER — Ambulatory Visit: Payer: BC Managed Care – PPO | Admitting: Internal Medicine

## 2014-05-06 ENCOUNTER — Telehealth: Payer: Self-pay | Admitting: *Deleted

## 2014-05-06 NOTE — Telephone Encounter (Signed)
I called patient to check in.  She reports that she is doing OK.  She continues to have occasional pain in her chest and back which feels like a sharp knife.  She is also concerned about her weight gain.  She says she is ready to get back to herself before lung cancer.  We discussed that she has had a major surgery and that it often takes many months to improve.  We also talked about some of the changes she feels may never return to "normal" since her body is not the same.  I reassured her that what she is experiencing is similar to other patients' experiences.  She has discussed with her cardiologist and PCP.  Her CT scan in 10/15 showed no evidence of recurrent or metastatic disease.  She is scheduled for a repeat scan in April.  We reviewed her next appointments.  I encouraged her to call me if there is anything I can do to assist her.

## 2014-06-04 ENCOUNTER — Other Ambulatory Visit: Payer: Self-pay | Admitting: *Deleted

## 2014-06-04 MED ORDER — CLOPIDOGREL BISULFATE 75 MG PO TABS: 75.0000 mg | ORAL_TABLET | Freq: Every day | ORAL | Status: DC

## 2014-06-11 ENCOUNTER — Telehealth: Payer: Self-pay | Admitting: Interventional Cardiology

## 2014-06-11 NOTE — Telephone Encounter (Signed)
I called and spoke with the patient. She reports that she has recently tried to get a refill on her Crestor and was sent a denial letter from her insurance company. She reports that this was due to the fact that she has not tried diet solutions as well as alternatives to statins. She tells me today that she did meet with a nutritionist in 1997 through Dr. Nancee Liter office. She then started lipitor around 1998 and developed leg aches. She took zocor after that with the same reaction. She was then placed on Crestor and developed less severe leg pains. This was stopped and she was started on Zetia. She developed severe stomach pain with this drug. Therefore, she was started back on Crestor 10 mg - she now takes this one tablet on Monday, Wednesday, and Fridays. I advised her I will forward to Anderson Malta, LPN for Dr.Varanasi and see if she has received a denial letter regarding her Crestor. If she has not received this by early next week, I will ask Anderson Malta to call her back and let her know, and the patient will forward the paperwork she received here to the office.

## 2014-06-11 NOTE — Telephone Encounter (Signed)
New message      Talk to a nurse regarding crestor appeal.  She received a letter from the ins company

## 2014-06-16 NOTE — Telephone Encounter (Signed)
Spoke with pt and she states that she accidentally tried to fill her prescription through [facility] and had forgotten that they said it would no longer be covered.  Informed pt to please forward the letter to our office so that I can review it. Pt in agreement. Pt states that she has some Crestor at home and has a discount card to be able to get more.

## 2014-06-29 ENCOUNTER — Other Ambulatory Visit (HOSPITAL_BASED_OUTPATIENT_CLINIC_OR_DEPARTMENT_OTHER): Payer: BLUE CROSS/BLUE SHIELD

## 2014-06-29 ENCOUNTER — Encounter (HOSPITAL_COMMUNITY): Payer: Self-pay

## 2014-06-29 ENCOUNTER — Ambulatory Visit (HOSPITAL_COMMUNITY)
Admission: RE | Admit: 2014-06-29 | Discharge: 2014-06-29 | Disposition: A | Discharge status: Home / Self Care | Payer: BLUE CROSS/BLUE SHIELD | Source: Ambulatory Visit | Attending: Internal Medicine | Admitting: Internal Medicine

## 2014-06-29 DIAGNOSIS — C3411 Malignant neoplasm of upper lobe, right bronchus or lung: Secondary | ICD-10-CM

## 2014-06-29 DIAGNOSIS — J439 Emphysema, unspecified: Secondary | ICD-10-CM | POA: Diagnosis not present

## 2014-06-29 DIAGNOSIS — Z902 Acquired absence of lung [part of]: Secondary | ICD-10-CM | POA: Insufficient documentation

## 2014-06-29 LAB — COMPREHENSIVE METABOLIC PANEL (CC13)
ALT: 14 U/L (ref 0–55)
AST: 16 U/L (ref 5–34)
Albumin: 3.9 g/dL (ref 3.5–5.0)
Alkaline Phosphatase: 81 U/L (ref 40–150)
Anion Gap: 13 mEq/L — ABNORMAL HIGH (ref 3–11)
BUN: 14.3 mg/dL (ref 7.0–26.0)
CO2: 23 mEq/L (ref 22–29)
Calcium: 9.4 mg/dL (ref 8.4–10.4)
Chloride: 105 mEq/L (ref 98–109)
Creatinine: 0.8 mg/dL (ref 0.6–1.1)
EGFR: 79 mL/min/{1.73_m2} — ABNORMAL LOW (ref >90)
Glucose: 105 mg/dl (ref 70–140)
Potassium: 4.7 mEq/L (ref 3.5–5.1)
Sodium: 141 mEq/L (ref 136–145)
Total Bilirubin: 0.54 mg/dL (ref 0.20–1.20)
Total Protein: 7.4 g/dL (ref 6.4–8.3)

## 2014-06-29 LAB — CBC WITH DIFFERENTIAL/PLATELET
BASO%: 0.9 % (ref 0.0–2.0)
Basophils Absolute: 0 10*3/uL (ref 0.0–0.1)
EOS%: 3.1 % (ref 0.0–7.0)
Eosinophils Absolute: 0.1 10*3/uL (ref 0.0–0.5)
HCT: 38.7 % (ref 34.8–46.6)
HGB: 12.6 g/dL (ref 11.6–15.9)
LYMPH%: 33 % (ref 14.0–49.7)
MCH: 28.2 pg (ref 25.1–34.0)
MCHC: 32.6 g/dL (ref 31.5–36.0)
MCV: 86.6 fL (ref 79.5–101.0)
MONO#: 0.4 10*3/uL (ref 0.1–0.9)
MONO%: 9.3 % (ref 0.0–14.0)
NEUT#: 2.4 10*3/uL (ref 1.5–6.5)
NEUT%: 53.7 % (ref 38.4–76.8)
Platelets: 277 10*3/uL (ref 145–400)
RBC: 4.47 10*6/uL (ref 3.70–5.45)
RDW: 15 % — ABNORMAL HIGH (ref 11.2–14.5)
WBC: 4.6 10*3/uL (ref 3.9–10.3)
lymph#: 1.5 10*3/uL (ref 0.9–3.3)

## 2014-06-29 MED ORDER — IOHEXOL 300 MG/ML  SOLN
80.0000 mL | Freq: Once | INTRAMUSCULAR | Status: AC | PRN
  Administered 2014-06-29: 80 mL via INTRAVENOUS

## 2014-07-06 ENCOUNTER — Encounter: Payer: Self-pay | Admitting: Internal Medicine

## 2014-07-06 ENCOUNTER — Telehealth: Payer: Self-pay | Admitting: Internal Medicine

## 2014-07-06 ENCOUNTER — Encounter (INDEPENDENT_AMBULATORY_CARE_PROVIDER_SITE_OTHER): Payer: Self-pay

## 2014-07-06 ENCOUNTER — Ambulatory Visit (HOSPITAL_BASED_OUTPATIENT_CLINIC_OR_DEPARTMENT_OTHER): Payer: BLUE CROSS/BLUE SHIELD | Admitting: Internal Medicine

## 2014-07-06 VITALS — BP 140/72 | HR 93 | Temp 98.2°F | Resp 20 | Ht 59.0 in | Wt 152.1 lb

## 2014-07-06 DIAGNOSIS — Z85118 Personal history of other malignant neoplasm of bronchus and lung: Secondary | ICD-10-CM

## 2014-07-06 DIAGNOSIS — C3411 Malignant neoplasm of upper lobe, right bronchus or lung: Secondary | ICD-10-CM

## 2014-07-06 NOTE — Telephone Encounter (Signed)
per pof to sch pt appt-gave pt copy of sch °

## 2014-07-06 NOTE — Telephone Encounter (Signed)
adv pt that [facility] will call to sch Scans-pt understood

## 2014-07-06 NOTE — Progress Notes (Signed)
[place] [phone]   [phone]  OFFICE PROGRESS NOTE  Horton Finer, MD [phone] [place] [address]  DIAGNOSIS: Stage IB (T2a., N0, M0) poorly differentiated squamous cell carcinoma, diagnosed in December of 2014.  PRIOR THERAPY: Flexible video bronchoscopy, right upper lobectomy with mediastinal lymph node dissection under the care of Dr. Cyndia Bent.   CURRENT THERAPY: Observation.  INTERVAL HISTORY: Teresa [name] 64 y.o. female returns to the clinic today for six-month followup visit accompanied by her son. The patient is feeling fine today with no specific complaints. She denied having any significant chest pain except for soreness at the right side of the chest from the previous surgical resection. She has no shortness of breath, cough or hemoptysis. She has no fever or chills, no nausea or vomiting. The patient had repeat CT scan of the chest performed recently and she is here for evaluation and discussion of her scan results.   MEDICAL HISTORY: Past Medical History  Diagnosis Date  . GERD (gastroesophageal reflux disease)   . Hypercholesterolemia   . Seasonal allergies   . Tobacco abuse   . Cataract     "just the beginnings on the right" (02/27/2013)  . Aortic valve stenosis, moderate   . Heart murmur   . Mass of lung     "small tumor RUL; they are watching it" (02/27/2013)  . Cellulitis 06/20/2012    "dog bite; right forearm" (06/24/2012)  . Nonspecific abnormal unspecified cardiovascular function study   . Peripheral vascular disease, unspecified   . CAD (coronary artery disease), native coronary artery   . Other and unspecified angina pectoris   . Myocardial infarction     "think dr said I've had 2 silent one" (02/27/2013)  . COPD (chronic obstructive pulmonary disease)     "little bit" (02/27/2013)  . Pneumonia 1990's    "once" (02/27/2013)  . Chronic bronchitis     "used to have it alot;  haven't had it in a long time" (02/27/2013)  . Exertional shortness of breath     "sometimes" (02/27/2013)  . Arthritis     "across my hips; buttocks; comes w/the weather" (02/27/2013)  . Tachycardia, unspecified     ALLERGIES:  is allergic to azithromycin; ceclor; morphine and related; penicillins; vicodin; septra; adhesive; doxycycline; lipitor; simvastatin; and zetia.  MEDICATIONS:  Current Outpatient Prescriptions  Medication Sig Dispense Refill  . albuterol (PROVENTIL HFA) 108 (90 BASE) MCG/ACT inhaler Inhale 2 puffs into the lungs 2 (two) times daily as needed for wheezing or shortness of breath.     [name] aspirin EC 81 MG tablet Take 81 mg by mouth daily.    . cholecalciferol (VITAMIN D) 1000 UNITS tablet Take 1 tablet (1,000 Units total) by mouth at bedtime. 90 tablet 0  . clopidogrel (PLAVIX) 75 MG tablet Take 1 tablet (75 mg total) by mouth daily. 90 tablet 0  . Coenzyme Q10 (CO Q 10) 100 MG CAPS Take 100 mg by mouth at bedtime.     . lansoprazole (PREVACID) 30 MG capsule Take 1 capsule (30 mg total) by mouth daily.    [name] NASAL SALINE NA Place 1 spray into the nose daily as needed.     . nitroGLYCERIN (NITROSTAT) 0.4 MG SL tablet Place 1 tablet (0.4 mg total) under the tongue every 5 (five) minutes as needed for chest pain. 25 tablet 4  . OVER THE COUNTER MEDICATION Take 1 tablet by mouth daily with  lunch. [facility] Menopause support    . ranitidine (ZANTAC) 150 MG tablet Take 150 mg by mouth as needed for heartburn.    . rosuvastatin (CRESTOR) 10 MG tablet 1 TABLET 4 X A WEEK (Patient taking differently: 1 TABLET 3 X A WEEK) 21 tablet 0  . acetaminophen (TYLENOL) 500 MG tablet Take 1,000 mg by mouth 3 (three) times daily as needed for pain.      No current facility-administered medications for this visit.    SURGICAL HISTORY:  Past Surgical History  Procedure Laterality Date  . Mouth surgery  2010?    "for bone loss" (06/24/2012)  . Video bronchoscopy  09/12/2011    Procedure: VIDEO  BRONCHOSCOPY WITH FLUORO;  Surgeon: Tanda Rockers, MD;  Location: [facility];  Service: Cardiopulmonary;  Laterality: Bilateral;  . Tonsillectomy  1960  . Ganglion cyst excision Left 1975    "wrist"  . Coronary angioplasty with stent placement  10/2012; 12/08/2012    "7 + 3" (02/27/2013)  . Flexible bronchoscopy N/A 03/02/2013    Procedure: FLEXIBLE BRONCHOSCOPY;  Surgeon: Gaye Pollack, MD;  Location: [facility];  Service: Thoracic;  Laterality: N/A;  . Thoracotomy/lobectomy Right 03/02/2013    Procedure: Right Video Assisted Thoracoscopy/Thoracotomy with upper Lobectomy;  Surgeon: Gaye Pollack, MD;  Location: [facility] OR;  Service: Thoracic;  Laterality: Right;  Right Lung Upper  Lobectomy   . Percutaneous coronary stent intervention (pci-s) N/A 10/09/2012    Procedure: PERCUTANEOUS CORONARY STENT INTERVENTION (PCI-S);  Surgeon: Jettie Booze, MD;  Location: [facility] CATH LAB;  Service: Cardiovascular;  Laterality: N/A;  . Left heart catheterization with coronary angiogram N/A 12/08/2012    Procedure: LEFT HEART CATHETERIZATION WITH CORONARY ANGIOGRAM;  Surgeon: Jettie Booze, MD;  Location: [facility] CATH LAB;  Service: Cardiovascular;  Laterality: N/A;    REVIEW OF SYSTEMS:  A comprehensive review of systems was negative.   PHYSICAL EXAMINATION: General appearance: alert, cooperative and no distress Head: Normocephalic, without obvious abnormality, atraumatic Neck: no adenopathy, no JVD, supple, symmetrical, trachea midline and thyroid not enlarged, symmetric, no tenderness/mass/nodules Lymph nodes: Cervical, supraclavicular, and axillary nodes normal. Resp: clear to auscultation bilaterally Back: symmetric, no curvature. ROM normal. No CVA tenderness. Cardio: regular rate and rhythm, S1, S2 normal, no murmur, click, rub or gallop GI: soft, non-tender; bowel sounds normal; no masses,  no organomegaly Extremities: extremities normal, atraumatic, no cyanosis or edema  ECOG PERFORMANCE STATUS: 1  - Symptomatic but completely ambulatory  Blood pressure 140/72, pulse 93, temperature 98.2 F (36.8 C), temperature source Oral, resp. rate 20, height '4\' 11"'$  (1.499 m), weight 152 lb 1.6 oz (68.992 kg), last menstrual period 10/04/1999, SpO2 98 %.  LABORATORY DATA: Lab Results  Component Value Date   WBC 4.6 06/29/2014   HGB 12.6 06/29/2014   HCT 38.7 06/29/2014   MCV 86.6 06/29/2014   PLT 277 06/29/2014      Chemistry      Component Value Date/Time   NA 141 06/29/2014 0810   NA 140 03/05/2013 0450   K 4.7 06/29/2014 0810   K 4.1 03/05/2013 0450   CL 101 03/05/2013 0450   CO2 23 06/29/2014 0810   CO2 29 03/05/2013 0450   BUN 14.3 06/29/2014 0810   BUN 12 03/05/2013 0450   CREATININE 0.8 06/29/2014 0810   CREATININE 0.80 12/29/2013 0759      Component Value Date/Time   CALCIUM 9.4 06/29/2014 0810   CALCIUM 8.5 03/05/2013 0450   ALKPHOS 81 06/29/2014 0810  ALKPHOS 66 02/22/2014 0745   AST 16 06/29/2014 0810   AST 20 02/22/2014 0745   ALT 14 06/29/2014 0810   ALT 18 02/22/2014 0745   BILITOT 0.54 06/29/2014 0810   BILITOT 0.6 02/22/2014 0745       RADIOGRAPHIC STUDIES: Ct Chest W Contrast  06/29/2014   CLINICAL DATA:  Lung cancer.  Prior right upper lobectomy.  EXAM: CT CHEST WITH CONTRAST  TECHNIQUE: Multidetector CT imaging of the chest was performed during intravenous contrast administration.  CONTRAST:  66m OMNIPAQUE IOHEXOL 300 MG/ML  SOLN  COMPARISON:  Multiple exams, including 12/29/2013  FINDINGS: Mediastinum/Nodes: A lymph node below the right mainstem bronchus measures 8 mm in short axis, previously the same. No overtly pathologic thoracic adenopathy. Coronary artery stents noted. Atherosclerotic aortic arch and branch vasculature.  Lungs/Pleura: Postoperative findings from right upper lobectomy. Emphysema. Peripheral interstitial accentuation noted posteriorly in the right lower lobe. Similar mild posterior interstitial accentuation noted in the left lower  lobe, possibly reflecting early fibrosis.  Upper abdomen: Unremarkable  Musculoskeletal: Unremarkable  IMPRESSION: 1. Stable postoperative findings from right upper lobectomy, without recurrence identified. 2. Emphysema and some scattered interstitial accentuation in the lower lobes. This is chronic. 3. Atherosclerosis with coronary artery stents.   Electronically Signed   By: WVan ClinesM.D.   On: 06/29/2014 10:01   ASSESSMENT AND PLAN: this is a very pleasant 64years old white female with history of a stage IB non-small cell lung cancer status post right upper lobectomy with lymph node dissection and has been observation with no evidence for disease recurrence. I discussed the scan results with the patient today. I recommended for her to continue on observation with repeat CT scan of the chest in 6 months. She was advised to call immediately if she has any concerning symptoms in the interval. The patient voices understanding of current disease status and treatment options and is in agreement with the current care plan.  All questions were answered. The patient knows to call the clinic with any problems, questions or concerns. We can certainly see the patient much sooner if necessary.  Disclaimer: This note was dictated with voice recognition software. Similar sounding words can inadvertently be transcribed and may not be corrected upon review.

## 2014-07-07 ENCOUNTER — Other Ambulatory Visit: Payer: Self-pay | Admitting: Pharmacist Clinician (PhC)/ Clinical Pharmacy Specialist

## 2014-07-07 MED ORDER — ROSUVASTATIN CALCIUM 10 MG PO TABS: ORAL_TABLET | ORAL | Status: DC

## 2014-07-09 ENCOUNTER — Other Ambulatory Visit: Payer: Self-pay

## 2014-07-09 ENCOUNTER — Telehealth: Payer: Self-pay

## 2014-07-09 MED ORDER — ROSUVASTATIN CALCIUM 10 MG PO TABS: ORAL_TABLET | ORAL | Status: DC

## 2014-07-09 NOTE — Telephone Encounter (Signed)
Prior auth in progress for Crestor '10mg'$  3 or 4 times weekly. Can take up to 10 days, per rep.

## 2014-07-12 ENCOUNTER — Telehealth: Payer: Self-pay | Admitting: Interventional Cardiology

## 2014-07-12 NOTE — Telephone Encounter (Signed)
New message      Pt need a refill on repatha----she want to talk to Teresa [name] in the lipid clinic----something about insurance problems

## 2014-07-14 ENCOUNTER — Telehealth: Payer: Self-pay

## 2014-07-14 NOTE — Telephone Encounter (Signed)
Rx sent to [facility] [phone]

## 2014-08-10 ENCOUNTER — Encounter: Payer: Self-pay | Admitting: Interventional Cardiology

## 2014-08-10 ENCOUNTER — Ambulatory Visit (INDEPENDENT_AMBULATORY_CARE_PROVIDER_SITE_OTHER): Payer: BLUE CROSS/BLUE SHIELD | Admitting: Interventional Cardiology

## 2014-08-10 VITALS — BP 142/82 | HR 85 | Ht 59.5 in | Wt 151.8 lb

## 2014-08-10 DIAGNOSIS — E669 Obesity, unspecified: Secondary | ICD-10-CM

## 2014-08-10 DIAGNOSIS — I2510 Atherosclerotic heart disease of native coronary artery without angina pectoris: Secondary | ICD-10-CM | POA: Diagnosis not present

## 2014-08-10 DIAGNOSIS — R079 Chest pain, unspecified: Secondary | ICD-10-CM

## 2014-08-10 DIAGNOSIS — E782 Mixed hyperlipidemia: Secondary | ICD-10-CM

## 2014-08-10 DIAGNOSIS — I350 Nonrheumatic aortic (valve) stenosis: Secondary | ICD-10-CM

## 2014-08-10 MED ORDER — CLOPIDOGREL BISULFATE 75 MG PO TABS: 75.0000 mg | ORAL_TABLET | Freq: Every day | ORAL | Status: DC

## 2014-08-10 MED ORDER — ROSUVASTATIN CALCIUM 10 MG PO TABS: ORAL_TABLET | ORAL | Status: DC

## 2014-08-10 NOTE — Progress Notes (Signed)
Patient ID: Teresa [name], female   DOB: 02/01/1951, 64 y.o.   MRN: [id number]     Cardiology Office Note   Date:  08/10/2014   ID:  Ravon, [place] 1950/04/28, MRN [id number]  PCP:  Horton Finer, MD    No chief complaint on file. CAD   Wt Readings from Last 3 Encounters:  08/10/14 151 lb 12.8 oz (68.856 kg)  07/06/14 152 lb 1.6 oz (68.992 kg)  03/02/14 150 lb (68.04 kg)       History of Present Illness: Teresa [name] is a 64 y.o. female  who has had a complex left main , circumflex , LAD stenting after a dissection during PCI in 8/14. She had PCI of the RCA complicated by dissection and required 3 stents. She had successful lung surgery to remove a malignancy in 12/14. She is doing well since that time. She has not had problems with regular walking. She has not had any excessive shortness of breath either.  CAD/ASCVD:  Denies :  Dizziness.  Dyspnea on exertion.  Fatigue.  Nitroglycerin.  Orthopnea.  Palpitations.  The isosorbide was Stopped due to headache.   She has occasional spasms in her chest.  THey last a second at the most and resolve spontaneously.  No prolonged pain.      Past Medical History  Diagnosis Date  . GERD (gastroesophageal reflux disease)   . Hypercholesterolemia   . Seasonal allergies   . Tobacco abuse   . Cataract     "just the beginnings on the right" (02/27/2013)  . Aortic valve stenosis, moderate   . Heart murmur   . Mass of lung     "small tumor RUL; they are watching it" (02/27/2013)  . Cellulitis 06/20/2012    "dog bite; right forearm" (06/24/2012)  . Nonspecific abnormal unspecified cardiovascular function study   . Peripheral vascular disease, unspecified   . CAD (coronary artery disease), native coronary artery   . Other and unspecified angina pectoris   . Myocardial infarction     "think dr said I've had 2 silent one" (02/27/2013)  . COPD (chronic obstructive pulmonary disease)     "little bit"  (02/27/2013)  . Pneumonia 1990's    "once" (02/27/2013)  . Chronic bronchitis     "used to have it alot; haven't had it in a long time" (02/27/2013)  . Exertional shortness of breath     "sometimes" (02/27/2013)  . Arthritis     "across my hips; buttocks; comes w/the weather" (02/27/2013)  . Tachycardia, unspecified     Past Surgical History  Procedure Laterality Date  . Mouth surgery  2010?    "for bone loss" (06/24/2012)  . Video bronchoscopy  09/12/2011    Procedure: VIDEO BRONCHOSCOPY WITH FLUORO;  Surgeon: Tanda Rockers, MD;  Location: [facility];  Service: Cardiopulmonary;  Laterality: Bilateral;  . Tonsillectomy  1960  . Ganglion cyst excision Left 1975    "wrist"  . Coronary angioplasty with stent placement  10/2012; 12/08/2012    "7 + 3" (02/27/2013)  . Flexible bronchoscopy N/A 03/02/2013    Procedure: FLEXIBLE BRONCHOSCOPY;  Surgeon: Gaye Pollack, MD;  Location: [facility];  Service: Thoracic;  Laterality: N/A;  . Thoracotomy/lobectomy Right 03/02/2013    Procedure: Right Video Assisted Thoracoscopy/Thoracotomy with upper Lobectomy;  Surgeon: Gaye Pollack, MD;  Location: [facility] OR;  Service: Thoracic;  Laterality: Right;  Right Lung Upper  Lobectomy   . Percutaneous coronary stent intervention (pci-s) N/A 10/09/2012  Procedure: PERCUTANEOUS CORONARY STENT INTERVENTION (PCI-S);  Surgeon: Jettie Booze, MD;  Location: [facility] CATH LAB;  Service: Cardiovascular;  Laterality: N/A;  . Left heart catheterization with coronary angiogram N/A 12/08/2012    Procedure: LEFT HEART CATHETERIZATION WITH CORONARY ANGIOGRAM;  Surgeon: Jettie Booze, MD;  Location: [facility] CATH LAB;  Service: Cardiovascular;  Laterality: N/A;     Current Outpatient Prescriptions  Medication Sig Dispense Refill  . acetaminophen (TYLENOL) 500 MG tablet Take 1,000 mg by mouth 3 (three) times daily as needed for pain.     [name] albuterol (PROVENTIL HFA) 108 (90 BASE) MCG/ACT inhaler Inhale 2 puffs into the lungs  2 (two) times daily as needed for wheezing or shortness of breath.     [name] aspirin EC 81 MG tablet Take 81 mg by mouth daily.    . cholecalciferol (VITAMIN D) 1000 UNITS tablet Take 1 tablet (1,000 Units total) by mouth at bedtime. 90 tablet 0  . clopidogrel (PLAVIX) 75 MG tablet Take 1 tablet (75 mg total) by mouth daily. 90 tablet 3  . Coenzyme Q10 (CO Q 10) 100 MG CAPS Take 100 mg by mouth at bedtime.     . Evolocumab (REPATHA SURECLICK [place]) Inject 585 mg into the skin. EVERY OTHER WEEK (TUESDAY)    . lansoprazole (PREVACID) 30 MG capsule Take 1 capsule (30 mg total) by mouth daily.    [name] NASAL SALINE NA Place 1 spray into the nose daily as needed.     . nitroGLYCERIN (NITROSTAT) 0.4 MG SL tablet Place 1 tablet (0.4 mg total) under the tongue every 5 (five) minutes as needed for chest pain. 25 tablet 4  . OVER THE COUNTER MEDICATION Take 1 tablet by mouth daily with lunch. [facility] Menopause support    . ranitidine (ZANTAC) 150 MG tablet Take 150 mg by mouth as needed for heartburn.    . rosuvastatin (CRESTOR) 10 MG tablet Take 1 tablet by mouth up to 4 times per week as directed 48 tablet 3   No current facility-administered medications for this visit.    Allergies:   Azithromycin; Ceclor; Lipitor; Morphine and related; Penicillins; Septra; Simvastatin; Vicodin; Zetia; Doxycycline; and Adhesive    Social History:  The patient  reports that she quit smoking about 22 months ago. Her smoking use included Cigarettes. She has a 20 pack-year smoking history. She has never used smokeless tobacco. She reports that she does not drink alcohol or use illicit drugs.   Family History:  The patient's *family history includes Alcohol abuse in her father; Lupus in her mother; Migraines in her sister; Other in her sister; Pulmonary fibrosis in her brother.    ROS:  Please see the history of present illness.   Otherwise, review of systems are positive for chest spasms as noted above.   All other systems are  reviewed and negative.    PHYSICAL EXAM: VS:  BP 142/82 mmHg  Pulse 85  Ht 4' 11.5" (1.511 m)  Wt 151 lb 12.8 oz (68.856 kg)  BMI 30.16 kg/m2  LMP 10/04/1999 , BMI Body mass index is 30.16 kg/(m^2). GEN: Well nourished, well developed, in no acute distress HEENT: normal Neck: no JVD, carotid bruits, or masses Cardiac: RRR; 3/6 systolic murmur, rubs, or gallops,no edema  Respiratory:  clear to auscultation bilaterally, normal work of breathing GI: soft, nontender, nondistended, + BS MS: no deformity or atrophy Skin: warm and dry, no rash Neuro:  Strength and sensation are intact Psych: euthymic mood, full affect  EKG:   The ekg ordered today demonstrates NSR, septal Q waves   Recent Labs: 06/29/2014: ALT 14; BUN 14.3; Creatinine 0.8; Hemoglobin 12.6; Platelets 277; Potassium 4.7; Sodium 141   Lipid Panel    Component Value Date/Time   CHOL 190 02/22/2014 0745   TRIG 148.0 02/22/2014 0745   HDL 45.50 02/22/2014 0745   CHOLHDL 4 02/22/2014 0745   VLDL 29.6 02/22/2014 0745   LDLCALC 115* 02/22/2014 0745     Other studies Reviewed: Additional studies/ records that were reviewed today with results demonstrating: CathFindings as noted above.   ASSESSMENT AND PLAN:  1. CAD: P2Y12 testing showed Plavix was adequate antiplatelet therapy. No further chest discomfort since RCA stents. Left-sided stents were widely patent. Her lung cancer surgery was done in December 2014.She had a moderate lesion in the proximal right coronary artery but it did not appear to be hemodynamically significant. She has restarted her DAPT without bleeding problems. Chest and shoulder pain seem atypical for ischemic pain. OK to stop aspirin at this point.  Will continue plavix.  OK to hold plavix for colonoscopy, for 5 days prior.  2. Hyperlipidemia: Last LDL was 136 while she is on Crestor 10 mg 3 times a week. Increased crestor 4x/week but she did not tolerate this due to leg pain and muscle  aches.Did not tolerate Zocor and lipitor due to muscle pains. Did not tolerate Zetia due to stomach pains. Last LDL 130. Started PCSK9 inhibitor, Repatha. Tolerating Crestor 3 x/week. 3. Aortic stenosis: Mild to moderate in the past. No sx of severe AS.  Murmur getting more prominent.  Sx not correlating to the murmur intensity. 4. Working on weight loss. Gained weight after stopping smoking. Try to increase walking to 150 minutes/week. Discussed the Mediterranean diet at her last visit which she is trying. She has had success with a high fiber diet in the past to lose weight. She will try high fiber diet in the past. 5. Chest pain, she has several episodes of atypical chest pain which is very short-lived. It does not last long enough for her to even get a nitroglycerin tablet. If she has any prolonged discomfort, this would prompt [place] to do an ischemic evaluation. What she describes now I think is unlikely to be ischemia. She will let us know if symptoms get worse.   Current medicines are reviewed at length with the patient today.  The patient concerns regarding her medicines were addressed.  The following changes have been made:  After 8/16, she can stop aspirin.  Labs/ tests ordered today include:   Orders Placed This Encounter  Procedures  . EKG 12-Lead    Recommend 150 minutes/week of aerobic exercise Low fat, low carb, high fiber diet recommended  Disposition:   FU in 1 year   Teresita Madura., MD  08/10/2014 4:19 PM    [facility] [place], [place], [place]  [address] Phone: [phone]; Fax: [phone]

## 2014-08-10 NOTE — Patient Instructions (Signed)
Medication Instructions:  None  Labwork: None  Testing/Procedures: None  Follow-Up: Your physician wants you to follow-up in: 1 year with Dr. Irish Lack.  You will receive a reminder letter in the mail two months in advance. If you don't receive a letter, please call our office to schedule the follow-up appointment.    Any Other Special Instructions Will Be Listed Below (If Applicable).

## 2014-08-11 ENCOUNTER — Telehealth: Payer: Self-pay

## 2014-08-11 NOTE — Telephone Encounter (Signed)
Prior auth request for Crestor '10mg'$  3times weekly, sent to [facility].

## 2014-09-02 ENCOUNTER — Telehealth: Payer: Self-pay

## 2014-09-02 NOTE — Telephone Encounter (Signed)
Still trying to get Crestor '10mg'$  3 times weekly authorized. Form faxed to [place] [employer] [place].

## 2014-09-04 ENCOUNTER — Other Ambulatory Visit: Payer: Self-pay | Admitting: Pharmacist Clinician (PhC)/ Clinical Pharmacy Specialist

## 2014-09-04 DIAGNOSIS — E782 Mixed hyperlipidemia: Secondary | ICD-10-CM

## 2014-09-04 NOTE — Telephone Encounter (Signed)
Placed orders to have cholesterol checked.

## 2014-09-09 ENCOUNTER — Telehealth: Payer: Self-pay

## 2014-09-09 ENCOUNTER — Other Ambulatory Visit: Payer: Self-pay

## 2014-09-09 NOTE — Telephone Encounter (Signed)
Fax from [place] approving Crestor '10mg'$  4 times weekly. Will send this letter to [facility] so they can ship it.

## 2014-09-13 ENCOUNTER — Other Ambulatory Visit: Payer: Self-pay | Admitting: Obstetrics & Gynecology

## 2014-09-14 LAB — CYTOLOGY - PAP

## 2014-11-17 ENCOUNTER — Other Ambulatory Visit: Payer: Self-pay | Admitting: Gastroenterology

## 2014-12-09 ENCOUNTER — Telehealth: Payer: Self-pay | Admitting: Interventional Cardiology

## 2014-12-09 NOTE — Telephone Encounter (Signed)
New message     Pt is having a colonscopy on 01-11-15.  She will be off plavix but on '81mg'$  aspirin.  On 01-12-15, she will have a tooth extracted.  Is this ok since pt will be off plavix and on aspirin?

## 2014-12-09 NOTE — Telephone Encounter (Signed)
Will forward to Dr. Irish Lack and his nurse Jeani Hawking for review, advisement and follow up.

## 2014-12-13 NOTE — Telephone Encounter (Signed)
Pt states that MD doing colonoscopy has her stopping Plavix on 01/04/15-01/13/15 and taking ASA '81mg'$  while off Plavix. MD wanted her at least on ASA since she has had so many stents placed. Spoke with Terri at Dr. Mariann Laster, DDS office and she said that pt's typically go back on meds the day after.  Will send new information to Dr. Irish Lack to be sure he is ok with these parameters.

## 2014-12-13 NOTE — Telephone Encounter (Signed)
OK with me.  WOuld check with te dentist as well.

## 2014-12-13 NOTE — Telephone Encounter (Signed)
I agree.  I think that is ok for her to have the procedures.

## 2014-12-13 NOTE — Telephone Encounter (Signed)
Spoke with pt and informed her of information provided by Dr. Irish Lack. Pt verbalized understanding and was in agreement with this plan.

## 2014-12-28 ENCOUNTER — Other Ambulatory Visit: Payer: Self-pay | Admitting: Gastroenterology

## 2015-01-03 ENCOUNTER — Other Ambulatory Visit (HOSPITAL_BASED_OUTPATIENT_CLINIC_OR_DEPARTMENT_OTHER): Payer: BLUE CROSS/BLUE SHIELD

## 2015-01-03 ENCOUNTER — Ambulatory Visit (HOSPITAL_COMMUNITY)
Admission: RE | Admit: 2015-01-03 | Discharge: 2015-01-03 | Disposition: A | Discharge status: Home / Self Care | Payer: BLUE CROSS/BLUE SHIELD | Source: Ambulatory Visit | Attending: Internal Medicine | Admitting: Internal Medicine

## 2015-01-03 ENCOUNTER — Telehealth: Payer: Self-pay | Admitting: Interventional Cardiology

## 2015-01-03 ENCOUNTER — Encounter (HOSPITAL_COMMUNITY): Payer: Self-pay

## 2015-01-03 DIAGNOSIS — I252 Old myocardial infarction: Secondary | ICD-10-CM | POA: Diagnosis not present

## 2015-01-03 DIAGNOSIS — J439 Emphysema, unspecified: Secondary | ICD-10-CM | POA: Diagnosis not present

## 2015-01-03 DIAGNOSIS — I7090 Unspecified atherosclerosis: Secondary | ICD-10-CM | POA: Insufficient documentation

## 2015-01-03 DIAGNOSIS — Z85118 Personal history of other malignant neoplasm of bronchus and lung: Secondary | ICD-10-CM | POA: Diagnosis not present

## 2015-01-03 DIAGNOSIS — I2510 Atherosclerotic heart disease of native coronary artery without angina pectoris: Secondary | ICD-10-CM | POA: Diagnosis not present

## 2015-01-03 DIAGNOSIS — C3411 Malignant neoplasm of upper lobe, right bronchus or lung: Secondary | ICD-10-CM

## 2015-01-03 DIAGNOSIS — Z902 Acquired absence of lung [part of]: Secondary | ICD-10-CM | POA: Diagnosis not present

## 2015-01-03 DIAGNOSIS — Z08 Encounter for follow-up examination after completed treatment for malignant neoplasm: Secondary | ICD-10-CM | POA: Insufficient documentation

## 2015-01-03 LAB — CBC WITH DIFFERENTIAL/PLATELET
BASO%: 0.9 % (ref 0.0–2.0)
Basophils Absolute: 0 10*3/uL (ref 0.0–0.1)
EOS ABS: 0.1 10*3/uL (ref 0.0–0.5)
EOS%: 2.5 % (ref 0.0–7.0)
HCT: 39.8 % (ref 34.8–46.6)
HGB: 12.9 g/dL (ref 11.6–15.9)
LYMPH%: 35 % (ref 14.0–49.7)
MCH: 28.9 pg (ref 25.1–34.0)
MCHC: 32.4 g/dL (ref 31.5–36.0)
MCV: 89.2 fL (ref 79.5–101.0)
MONO#: 0.4 10*3/uL (ref 0.1–0.9)
MONO%: 8.2 % (ref 0.0–14.0)
NEUT#: 2.4 10*3/uL (ref 1.5–6.5)
NEUT%: 53.4 % (ref 38.4–76.8)
PLATELETS: 261 10*3/uL (ref 145–400)
RBC: 4.46 10*6/uL (ref 3.70–5.45)
RDW: 14.4 % (ref 11.2–14.5)
WBC: 4.4 10*3/uL (ref 3.9–10.3)
lymph#: 1.6 10*3/uL (ref 0.9–3.3)

## 2015-01-03 LAB — COMPREHENSIVE METABOLIC PANEL (CC13)
ALBUMIN: 3.8 g/dL (ref 3.5–5.0)
ALK PHOS: 71 U/L (ref 40–150)
ALT: 13 U/L (ref 0–55)
AST: 14 U/L (ref 5–34)
Anion Gap: 7 mEq/L (ref 3–11)
BUN: 9.9 mg/dL (ref 7.0–26.0)
CALCIUM: 9.5 mg/dL (ref 8.4–10.4)
CO2: 25 mEq/L (ref 22–29)
CREATININE: 0.8 mg/dL (ref 0.6–1.1)
Chloride: 109 mEq/L (ref 98–109)
EGFR: 83 mL/min/{1.73_m2} — ABNORMAL LOW (ref >90)
Glucose: 106 mg/dl (ref 70–140)
Potassium: 5 mEq/L (ref 3.5–5.1)
SODIUM: 141 meq/L (ref 136–145)
Total Bilirubin: 0.35 mg/dL (ref 0.20–1.20)
Total Protein: 7 g/dL (ref 6.4–8.3)

## 2015-01-03 MED ORDER — IOHEXOL 300 MG/ML  SOLN
75.0000 mL | Freq: Once | INTRAMUSCULAR | Status: AC | PRN
  Administered 2015-01-03: 75 mL via INTRAVENOUS

## 2015-01-03 NOTE — Telephone Encounter (Signed)
New Prob   Pt calling about her Evolocumab (REPATHA SURECLICK [place]) medication. States she is wanting to change time from 11:30 AM to 6:30 PM in the evening so her husband can assist her. Requesting a detailed mess on her VM as she is getting ready to go into work.

## 2015-01-03 NOTE — Telephone Encounter (Signed)
Spoke with pt's husband.  Okay to change time of injection.

## 2015-01-05 ENCOUNTER — Other Ambulatory Visit (INDEPENDENT_AMBULATORY_CARE_PROVIDER_SITE_OTHER): Payer: BLUE CROSS/BLUE SHIELD | Admitting: *Deleted

## 2015-01-05 DIAGNOSIS — I2510 Atherosclerotic heart disease of native coronary artery without angina pectoris: Secondary | ICD-10-CM

## 2015-01-05 DIAGNOSIS — I739 Peripheral vascular disease, unspecified: Secondary | ICD-10-CM

## 2015-01-05 DIAGNOSIS — E782 Mixed hyperlipidemia: Secondary | ICD-10-CM | POA: Diagnosis not present

## 2015-01-05 DIAGNOSIS — I350 Nonrheumatic aortic (valve) stenosis: Secondary | ICD-10-CM

## 2015-01-05 LAB — LIPID PANEL
CHOLESTEROL: 87 mg/dL — AB (ref 125–200)
HDL: 55 mg/dL (ref >=46)
LDL Cholesterol: 17 mg/dL (ref <130)
TRIGLYCERIDES: 77 mg/dL (ref <150)
Total CHOL/HDL Ratio: 1.6 Ratio (ref <=5.0)
VLDL: 15 mg/dL (ref <30)

## 2015-01-05 LAB — HEPATIC FUNCTION PANEL
ALT: 15 U/L (ref 6–29)
AST: 16 U/L (ref 10–35)
Albumin: 4.1 g/dL (ref 3.6–5.1)
Alkaline Phosphatase: 65 U/L (ref 33–130)
BILIRUBIN TOTAL: 0.5 mg/dL (ref 0.2–1.2)
Bilirubin, Direct: 0.1 mg/dL (ref <=0.2)
Indirect Bilirubin: 0.4 mg/dL (ref 0.2–1.2)
Total Protein: 7.2 g/dL (ref 6.1–8.1)

## 2015-01-05 NOTE — Addendum Note (Signed)
Addended by: Eulis Foster on: 01/05/2015 07:33 AM   Modules accepted: Orders

## 2015-01-10 ENCOUNTER — Telehealth: Payer: Self-pay | Admitting: Internal Medicine

## 2015-01-10 ENCOUNTER — Encounter: Payer: Self-pay | Admitting: *Deleted

## 2015-01-10 ENCOUNTER — Encounter: Payer: Self-pay | Admitting: Internal Medicine

## 2015-01-10 ENCOUNTER — Other Ambulatory Visit: Payer: Self-pay

## 2015-01-10 ENCOUNTER — Other Ambulatory Visit (INDEPENDENT_AMBULATORY_CARE_PROVIDER_SITE_OTHER): Payer: BLUE CROSS/BLUE SHIELD | Admitting: *Deleted

## 2015-01-10 ENCOUNTER — Ambulatory Visit (HOSPITAL_BASED_OUTPATIENT_CLINIC_OR_DEPARTMENT_OTHER): Payer: BLUE CROSS/BLUE SHIELD | Admitting: Internal Medicine

## 2015-01-10 VITALS — BP 146/67 | HR 86 | Temp 98.7°F | Resp 17 | Ht 59.5 in | Wt 143.6 lb

## 2015-01-10 DIAGNOSIS — Z85118 Personal history of other malignant neoplasm of bronchus and lung: Secondary | ICD-10-CM | POA: Diagnosis not present

## 2015-01-10 DIAGNOSIS — I10 Essential (primary) hypertension: Secondary | ICD-10-CM | POA: Diagnosis not present

## 2015-01-10 DIAGNOSIS — E782 Mixed hyperlipidemia: Secondary | ICD-10-CM

## 2015-01-10 DIAGNOSIS — C3411 Malignant neoplasm of upper lobe, right bronchus or lung: Secondary | ICD-10-CM

## 2015-01-10 LAB — HEPATIC FUNCTION PANEL
ALBUMIN: 4.3 g/dL (ref 3.6–5.1)
ALK PHOS: 76 U/L (ref 33–130)
ALT: 13 U/L (ref 6–29)
AST: 19 U/L (ref 10–35)
BILIRUBIN DIRECT: 0.1 mg/dL (ref <=0.2)
BILIRUBIN TOTAL: 0.5 mg/dL (ref 0.2–1.2)
Indirect Bilirubin: 0.4 mg/dL (ref 0.2–1.2)
Total Protein: 7.6 g/dL (ref 6.1–8.1)

## 2015-01-10 LAB — LIPID PANEL
Cholesterol: 94 mg/dL — ABNORMAL LOW (ref 125–200)
HDL: 56 mg/dL (ref >=46)
LDL CALC: 13 mg/dL (ref <130)
Total CHOL/HDL Ratio: 1.7 Ratio (ref <=5.0)
Triglycerides: 125 mg/dL (ref <150)
VLDL: 25 mg/dL (ref <30)

## 2015-01-10 NOTE — Progress Notes (Signed)
  Oncology Nurse Navigator Documentation    Navigator Encounter Type: Other (Routine follow up) (01/10/15 0845) Patient Visit Type: Medonc (01/10/15 0845) Treatment Phase: Other (Surveillance) (01/10/15 0845) Barriers/Navigation Needs: No barriers at this time (01/10/15 0845)   Interventions: None required (01/10/15 0845)  Patient at [facility] for f/u visit with Dr. Julien Nordmann.  She reports that she is doing well.  She has intentionally lost 10 pounds she had gained after she stopped smoking.  She reports that she went through a period where she was irritable and cried easily but is now feeling better.  She has started working at the [employer].  She continues to report some pain in her ribs near her surgery site, especially when she bends over, which she shared with Dr. Julien Nordmann.  She denies any questions or concerns at this time.  I encouraged her to call me for any needs.         Time Spent with Patient: 15 (01/10/15 0845)

## 2015-01-10 NOTE — Telephone Encounter (Signed)
Gave and printed appt sched and avs fo rpt for OCT and NOV 2017

## 2015-01-10 NOTE — Progress Notes (Signed)
[place] [phone]   [phone]  OFFICE PROGRESS NOTE  Shamleffer, Herschell Dimes, MD [address]  [place] [address]  DIAGNOSIS: Stage IB (T2a., N0, M0) poorly differentiated squamous cell carcinoma, diagnosed in December of 2014.  PRIOR THERAPY: Flexible video bronchoscopy, right upper lobectomy with mediastinal lymph node dissection under the care of Dr. Cyndia Bent.   CURRENT THERAPY: Observation.  INTERVAL HISTORY: Teresa [name] 64 y.o. female returns to the clinic today for six-month followup visit. The patient is feeling fine today with no specific complaints except for occasional right lower rib cage pain likely secondary to her previous surgery. She denied having any significant shortness of breath, cough or hemoptysis. She has no fever or chills, no nausea or vomiting. The patient had repeat CT scan of the chest performed recently and she is here for evaluation and discussion of her scan results.   MEDICAL HISTORY: Past Medical History  Diagnosis Date  . GERD (gastroesophageal reflux disease)   . Hypercholesterolemia   . Seasonal allergies   . Tobacco abuse   . Cataract     "just the beginnings on the right" (02/27/2013)  . Aortic valve stenosis, moderate   . Heart murmur   . Mass of lung     "small tumor RUL; they are watching it" (02/27/2013)  . Cellulitis 06/20/2012    "dog bite; right forearm" (06/24/2012)  . Nonspecific abnormal unspecified cardiovascular function study   . Peripheral vascular disease, unspecified ([place])   . CAD (coronary artery disease), native coronary artery   . Other and unspecified angina pectoris   . Myocardial infarction [facility])     "think dr said I've had 2 silent one" (02/27/2013)  . COPD (chronic obstructive pulmonary disease) ([place])     "little bit" (02/27/2013)  . Pneumonia 1990's    "once" (02/27/2013)  . Chronic bronchitis ([place])     "used to have it alot; haven't had it in  a long time" (02/27/2013)  . Exertional shortness of breath     "sometimes" (02/27/2013)  . Arthritis     "across my hips; buttocks; comes w/the weather" (02/27/2013)  . Tachycardia, unspecified     ALLERGIES:  is allergic to azithromycin; ceclor; lipitor; morphine and related; penicillins; septra; simvastatin; vicodin; zetia; doxycycline; and adhesive.  MEDICATIONS:  Current Outpatient Prescriptions  Medication Sig Dispense Refill  . acetaminophen (TYLENOL) 500 MG tablet Take 1,000 mg by mouth 3 (three) times daily as needed for pain.     [name] albuterol (PROVENTIL HFA) 108 (90 BASE) MCG/ACT inhaler Inhale 2 puffs into the lungs 2 (two) times daily as needed for wheezing or shortness of breath.     [name] aspirin EC 81 MG tablet Take 81 mg by mouth daily.    . cholecalciferol (VITAMIN D) 1000 UNITS tablet Take 1 tablet (1,000 Units total) by mouth at bedtime. 90 tablet 0  . Coenzyme Q10 (CO Q 10) 100 MG CAPS Take 100 mg by mouth at bedtime.     . Evolocumab (REPATHA SURECLICK [place]) Inject 709 mg into the skin. EVERY OTHER WEEK (TUESDAY)    . fluticasone (FLONASE) 50 MCG/ACT nasal spray INHALE 2 SPRAYS IN EACH NOSTRIL AS NEEDED DAILY for allergies  4  . lansoprazole (PREVACID) 30 MG capsule Take 1 capsule (30 mg total) by mouth daily.    [name] NASAL SALINE NA Place 1 spray into the nose daily as needed (congestion).     . nitroGLYCERIN (  NITROSTAT) 0.4 MG SL tablet Place 1 tablet (0.4 mg total) under the tongue every 5 (five) minutes as needed for chest pain. 25 tablet 4  . OVER THE COUNTER MEDICATION Take 1 tablet by mouth daily with lunch. [facility] Menopause support    . ranitidine (ZANTAC) 150 MG tablet Take 150 mg by mouth daily as needed for heartburn.     . rosuvastatin (CRESTOR) 10 MG tablet Take 1 tablet by mouth up to 4 times per week as directed (Patient taking differently: Take 10 mg by mouth every Monday, Wednesday, and Friday. ) 48 tablet 3  . clopidogrel (PLAVIX) 75 MG tablet Take 1 tablet (75 mg  total) by mouth daily. (Patient not taking: Reported on 01/10/2015) 90 tablet 3   No current facility-administered medications for this visit.    SURGICAL HISTORY:  Past Surgical History  Procedure Laterality Date  . Mouth surgery  2010?    "for bone loss" (06/24/2012)  . Video bronchoscopy  09/12/2011    Procedure: VIDEO BRONCHOSCOPY WITH FLUORO;  Surgeon: Tanda Rockers, MD;  Location: [facility];  Service: Cardiopulmonary;  Laterality: Bilateral;  . Tonsillectomy  1960  . Ganglion cyst excision Left 1975    "wrist"  . Coronary angioplasty with stent placement  10/2012; 12/08/2012    "7 + 3" (02/27/2013)  . Flexible bronchoscopy N/A 03/02/2013    Procedure: FLEXIBLE BRONCHOSCOPY;  Surgeon: Gaye Pollack, MD;  Location: [facility];  Service: Thoracic;  Laterality: N/A;  . Thoracotomy/lobectomy Right 03/02/2013    Procedure: Right Video Assisted Thoracoscopy/Thoracotomy with upper Lobectomy;  Surgeon: Gaye Pollack, MD;  Location: [facility];  Service: Thoracic;  Laterality: Right;  Right Lung Upper  Lobectomy   . Percutaneous coronary stent intervention (pci-s) N/A 10/09/2012    Procedure: PERCUTANEOUS CORONARY STENT INTERVENTION (PCI-S);  Surgeon: Jettie Booze, MD;  Location: [facility] CATH LAB;  Service: Cardiovascular;  Laterality: N/A;  . Left heart catheterization with coronary angiogram N/A 12/08/2012    Procedure: LEFT HEART CATHETERIZATION WITH CORONARY ANGIOGRAM;  Surgeon: Jettie Booze, MD;  Location: [facility] CATH LAB;  Service: Cardiovascular;  Laterality: N/A;    REVIEW OF SYSTEMS:  A comprehensive review of systems was negative.   PHYSICAL EXAMINATION: General appearance: alert, cooperative and no distress Head: Normocephalic, without obvious abnormality, atraumatic Neck: no adenopathy, no JVD, supple, symmetrical, trachea midline and thyroid not enlarged, symmetric, no tenderness/mass/nodules Lymph nodes: Cervical, supraclavicular, and axillary nodes normal. Resp: clear to  auscultation bilaterally Back: symmetric, no curvature. ROM normal. No CVA tenderness. Cardio: regular rate and rhythm, S1, S2 normal, no murmur, click, rub or gallop GI: soft, non-tender; bowel sounds normal; no masses,  no organomegaly Extremities: extremities normal, atraumatic, no cyanosis or edema  ECOG PERFORMANCE STATUS: 1 - Symptomatic but completely ambulatory  Blood pressure 146/67, pulse 86, temperature 98.7 F (37.1 C), temperature source Oral, resp. rate 17, height 4' 11.5" (1.511 m), weight 143 lb 9.6 oz (65.137 kg), last menstrual period 10/04/1999, SpO2 96 %.  LABORATORY DATA: Lab Results  Component Value Date   WBC 4.4 01/03/2015   HGB 12.9 01/03/2015   HCT 39.8 01/03/2015   MCV 89.2 01/03/2015   PLT 261 01/03/2015      Chemistry      Component Value Date/Time   NA 141 01/03/2015 0822   NA 140 03/05/2013 0450   K 5.0 01/03/2015 0822   K 4.1 03/05/2013 0450   CL 101 03/05/2013 0450   CO2 25 01/03/2015 4166  CO2 29 03/05/2013 0450   BUN 9.9 01/03/2015 0822   BUN 12 03/05/2013 0450   CREATININE 0.8 01/03/2015 0822   CREATININE 0.80 12/29/2013 0759      Component Value Date/Time   CALCIUM 9.5 01/03/2015 0822   CALCIUM 8.5 03/05/2013 0450   ALKPHOS 65 01/05/2015 0733   ALKPHOS 71 01/03/2015 0822   AST 16 01/05/2015 0733   AST 14 01/03/2015 0822   ALT 15 01/05/2015 0733   ALT 13 01/03/2015 0822   BILITOT 0.5 01/05/2015 0733   BILITOT 0.35 01/03/2015 0822       RADIOGRAPHIC STUDIES: Ct Chest W Contrast  01/03/2015  CLINICAL DATA:  64 year old female with history of right-sided lung cancer status post right upper lobectomy in June 2014 complaining of intermittent right anterior chest pain. EXAM: CT CHEST WITH CONTRAST TECHNIQUE: Multidetector CT imaging of the chest was performed during intravenous contrast administration. CONTRAST:  65m OMNIPAQUE IOHEXOL 300 MG/ML  SOLN COMPARISON:  Chest CT 06/29/2014. FINDINGS: Mediastinum/Lymph Nodes: Heart size is  normal. There is no significant pericardial fluid, thickening or pericardial calcification. There is atherosclerosis of the thoracic aorta, the great vessels of the mediastinum and the coronary arteries, including calcified atherosclerotic plaque in the left main, left anterior descending, left circumflex and right coronary arteries. Myocardial thinning and rounding of the left ventricular apex, related to prior distal LAD territory myocardial infarction. No pathologically enlarged mediastinal or hilar lymph nodes. Esophagus is unremarkable in appearance. No axillary lymphadenopathy. Lungs/Pleura: Status post right upper lobectomy. Compensatory hyperexpansion of the right middle and lower lobes. No suspicious appearing pulmonary nodules or masses. No acute consolidative airspace disease. No pleural effusions. Linear scarring in the right middle lobe. Mild centrilobular and paraseptal emphysema. Patchy areas of subpleural reticulation throughout the lungs bilaterally, similar to prior examinations. Upper Abdomen: Unremarkable. Musculoskeletal/Soft Tissues: Postthoracotomy changes in the right hemithorax. There are no aggressive appearing lytic or blastic lesions noted in the visualized portions of the skeleton. IMPRESSION: 1. Status post right upper lobectomy. No findings to suggest local recurrence of disease or metastatic disease in the thorax. 2. Mild centrilobular and paraseptal emphysema. 3. Atherosclerosis, including left main and 3 vessel coronary artery disease. Evidence of prior distal LAD territory myocardial infarction, as above. Electronically Signed   By: DVinnie LangtonM.D.   On: 01/03/2015 10:00   ASSESSMENT AND PLAN: this is a very pleasant 64years old white female with history of a stage IB non-small cell lung cancer status post right upper lobectomy with lymph node dissection and has been observation with no evidence for disease recurrence. The patient is feeling fine today with no specific  complaints. I discussed the scan results with the patient today. I recommended for her to continue on observation with repeat CT scan of the chest in 1 year. For the hypertension and atherosclerosis, I advised the patient to continue her routine follow-up visit and evaluation by her primary care physician and cardiologist. She was advised to call immediately if she has any concerning symptoms in the interval. The patient voices understanding of current disease status and treatment options and is in agreement with the current care plan.  All questions were answered. The patient knows to call the clinic with any problems, questions or concerns. We can certainly see the patient much sooner if necessary.  Disclaimer: This note was dictated with voice recognition software. Similar sounding words can inadvertently be transcribed and may not be corrected upon review.

## 2015-01-11 ENCOUNTER — Encounter (HOSPITAL_COMMUNITY)
Admission: RE | Disposition: A | Discharge status: Home / Self Care | Payer: Self-pay | Source: Ambulatory Visit | Attending: Gastroenterology

## 2015-01-11 ENCOUNTER — Ambulatory Visit (HOSPITAL_COMMUNITY): Payer: BLUE CROSS/BLUE SHIELD | Admitting: Anesthesiology

## 2015-01-11 ENCOUNTER — Ambulatory Visit (HOSPITAL_COMMUNITY)
Admission: RE | Admit: 2015-01-11 | Discharge: 2015-01-11 | Disposition: A | Discharge status: Home / Self Care | Payer: BLUE CROSS/BLUE SHIELD | Source: Ambulatory Visit | Attending: Gastroenterology | Admitting: Gastroenterology

## 2015-01-11 ENCOUNTER — Encounter (HOSPITAL_COMMUNITY): Payer: Self-pay

## 2015-01-11 DIAGNOSIS — Z1211 Encounter for screening for malignant neoplasm of colon: Secondary | ICD-10-CM | POA: Diagnosis present

## 2015-01-11 DIAGNOSIS — K219 Gastro-esophageal reflux disease without esophagitis: Secondary | ICD-10-CM | POA: Diagnosis not present

## 2015-01-11 DIAGNOSIS — I350 Nonrheumatic aortic (valve) stenosis: Secondary | ICD-10-CM | POA: Diagnosis not present

## 2015-01-11 DIAGNOSIS — H269 Unspecified cataract: Secondary | ICD-10-CM | POA: Diagnosis not present

## 2015-01-11 DIAGNOSIS — I2510 Atherosclerotic heart disease of native coronary artery without angina pectoris: Secondary | ICD-10-CM | POA: Diagnosis not present

## 2015-01-11 DIAGNOSIS — E7800 Pure hypercholesterolemia, unspecified: Secondary | ICD-10-CM | POA: Diagnosis not present

## 2015-01-11 DIAGNOSIS — K5730 Diverticulosis of large intestine without perforation or abscess without bleeding: Secondary | ICD-10-CM | POA: Insufficient documentation

## 2015-01-11 DIAGNOSIS — K635 Polyp of colon: Secondary | ICD-10-CM | POA: Insufficient documentation

## 2015-01-11 HISTORY — PX: FLEXIBLE SIGMOIDOSCOPY: SHX5431

## 2015-01-11 HISTORY — PX: ESOPHAGOGASTRODUODENOSCOPY (EGD) WITH PROPOFOL: SHX5813

## 2015-01-11 SURGERY — ESOPHAGOGASTRODUODENOSCOPY (EGD) WITH PROPOFOL
Anesthesia: Monitor Anesthesia Care

## 2015-01-11 SURGERY — COLONOSCOPY WITH PROPOFOL
Anesthesia: Monitor Anesthesia Care

## 2015-01-11 MED ORDER — SODIUM CHLORIDE 0.9 % IV SOLN: INTRAVENOUS | Status: DC

## 2015-01-11 MED ORDER — PHENYLEPHRINE 40 MCG/ML (10ML) SYRINGE FOR IV PUSH (FOR BLOOD PRESSURE SUPPORT)
PREFILLED_SYRINGE | INTRAVENOUS | Status: AC
  Filled 2015-01-11: qty 10

## 2015-01-11 MED ORDER — PHENYLEPHRINE HCL 10 MG/ML IJ SOLN
INTRAMUSCULAR | Status: DC | PRN
  Administered 2015-01-11: 40 ug via INTRAVENOUS

## 2015-01-11 MED ORDER — FENTANYL CITRATE (PF) 100 MCG/2ML IJ SOLN
INTRAMUSCULAR | Status: DC | PRN
  Administered 2015-01-11 (×2): 50 ug via INTRAVENOUS

## 2015-01-11 MED ORDER — BUTAMBEN-TETRACAINE-BENZOCAINE 2-2-14 % EX AERO
INHALATION_SPRAY | CUTANEOUS | Status: DC | PRN
  Administered 2015-01-11: 1 via TOPICAL

## 2015-01-11 MED ORDER — PROPOFOL 10 MG/ML IV BOLUS
INTRAVENOUS | Status: AC
  Filled 2015-01-11: qty 20

## 2015-01-11 MED ORDER — PROPOFOL 10 MG/ML IV BOLUS
INTRAVENOUS | Status: DC | PRN
  Administered 2015-01-11 (×3): 20 mg via INTRAVENOUS

## 2015-01-11 MED ORDER — FENTANYL CITRATE (PF) 100 MCG/2ML IJ SOLN
INTRAMUSCULAR | Status: AC
  Filled 2015-01-11: qty 4

## 2015-01-11 MED ORDER — LIDOCAINE HCL (CARDIAC) 20 MG/ML IV SOLN
INTRAVENOUS | Status: DC | PRN
  Administered 2015-01-11: 100 mg via INTRAVENOUS

## 2015-01-11 MED ORDER — PROPOFOL 500 MG/50ML IV EMUL
INTRAVENOUS | Status: DC | PRN
  Administered 2015-01-11: 140 ug/kg/min via INTRAVENOUS

## 2015-01-11 MED ORDER — LACTATED RINGERS IV SOLN
INTRAVENOUS | Status: DC
  Administered 2015-01-11: 08:00:00 via INTRAVENOUS

## 2015-01-11 MED ORDER — LIDOCAINE HCL (CARDIAC) 20 MG/ML IV SOLN
INTRAVENOUS | Status: AC
  Filled 2015-01-11: qty 5

## 2015-01-11 SURGICAL SUPPLY — 25 items

## 2015-01-11 NOTE — Op Note (Signed)
Procedure: Diagnostic esophagogastroduodenoscopy. Chronic gastroesophageal reflux.  Endoscopist: Earle Gell  Premedication: Propofol administered by anesthesia  Procedure: The patient was placed in the left lateral decubitus position. The Pentax gastroscope was passed through the posterior hypopharynx into the proximal esophagus difficulty. The hypopharynx, larynx, and vocal cords appeared normal.  Esophagoscopy: The proximal, mid, and lower segments of the esophageal mucosa appeared normal. Squamocolumnar junction appeared regular and was noted at 35 cm from the incisor teeth. There was no endoscopic evidence for the presence of erosive esophagitis or Barrett's esophagus.  Gastroscopy: Retroflexed view of the gastric cardia and fundus was normal. The gastric body, antrum, and pylorus appeared normal.  Duodenoscopy: The duodenal bulb and descending duodenum appeared normal.  Assessment: Chronic gastroesophageal reflux associated with a normal esophagogastroduodenoscopy   Procedure: Screening flexible proctosigmoidoscopy Anal inspection and digital rectal exam were normal. The Pentax pediatric colonoscope was introduced into the rectum and advanced to approximately 25 cm from the anal verge. Due to a poorly mobile sigmoid colon, I was unable to advance the pediatric colonoscope proximally. The pediatric colonoscope was removed and the ultrathin colonoscope was introduced into the rectum and advanced to approximately 25 cm from the anal verge. Due to a poorly mobile sigmoid colon, I was unable to advance the ultrathin colonoscope proximally.  Endoscopic appearance of the rectum and distal sigmoid colon showed colonic diverticulosis and a few scattered 1 mm-2 mm hyperplastic-appearing polyps.  Assessment: Normal screening flexible proctosigmoidoscopy. A screening colonoscopy was not performed.  Recommendation: Schedule screening CT colonoscopy.

## 2015-01-11 NOTE — Discharge Instructions (Signed)

## 2015-01-11 NOTE — Anesthesia Preprocedure Evaluation (Addendum)
Anesthesia Evaluation  Patient identified by MRN, date of birth, ID band Patient awake    Reviewed: Allergy & Precautions, Patient's Chart, lab work & pertinent test results  Airway Mallampati: I  TM Distance: >3 FB Neck ROM: Full    Dental   Pulmonary former smoker,    Pulmonary exam normal        Cardiovascular + CAD, + Past MI and + Cardiac Stents  Normal cardiovascular exam+ Valvular Problems/Murmurs AS      Neuro/Psych    GI/Hepatic   Endo/Other    Renal/GU      Musculoskeletal   Abdominal   Peds  Hematology   Anesthesia Other Findings   Reproductive/Obstetrics                            Anesthesia Physical Anesthesia Plan  ASA: III  Anesthesia Plan: MAC   Post-op Pain Management:    Induction: Intravenous  Airway Management Planned: Simple Face Mask  Additional Equipment:   Intra-op Plan:   Post-operative Plan:   Informed Consent: I have reviewed the patients History and Physical, chart, labs and discussed the procedure including the risks, benefits and alternatives for the proposed anesthesia with the patient or authorized representative who has indicated his/her understanding and acceptance.     Plan Discussed with: CRNA and Surgeon  Anesthesia Plan Comments:         Anesthesia Quick Evaluation

## 2015-01-11 NOTE — H&P (Signed)
  Procedure: Screening colonoscopy and diagnostic esophagogastroduodenoscopy. Chronic gastroesophageal reflux. 05/03/2004 normal esophagogastroduodenoscopy performed. 01/03/2004 normal screening colonoscopy performed.   History: The patient is a 64 year old female born 1950-12-05. She is scheduled to undergo a repeat screening colonoscopy and diagnostic esophagogastroduodenoscopy. She has chronic gastroesophageal reflux.  Past medical history: Chronic gastroesophageal reflux. Hypercholesterolemia. Seasonal allergies. Lumbar radiculopathy. Cataracts. Moderate aortic valve stenosis. Squamous cell lung cancer. Coronary artery disease. Tonsillectomy. Lobar lung resection in 2015 to treat squamous cell lung cancer.  Exam: The patient is alert and lying comfortably on the endoscopy stretcher. Abdomen is soft and nontender to palpation. Lungs are clear to auscultation. Cardiac exam reveals a regular rhythm.  Plan: Proceed with diagnostic esophagogastroduodenoscopy followed by repeat screening colonoscopy

## 2015-01-11 NOTE — Transfer of Care (Signed)
Immediate Anesthesia Transfer of Care Note  Patient: Teresa [name]  Procedure(s) Performed: Procedure(s): ESOPHAGOGASTRODUODENOSCOPY (EGD) WITH PROPOFOL (N/A) COLONOSCOPY WITH PROPOFOL (N/A)  Patient Location: Endoscopy Unit  Anesthesia Type:MAC  Level of Consciousness: awake  Airway & Oxygen Therapy: Patient Spontanous Breathing and Patient connected to nasal cannula oxygen  Post-op Assessment: Report given to RN and Post -op Vital signs reviewed and stable  Post vital signs: Reviewed and stable  Last Vitals:  Filed Vitals:   01/11/15 0718  BP: 121/62  Pulse: 82  Temp: 36.7 C  Resp: 13    Complications: No apparent anesthesia complications

## 2015-01-11 NOTE — Anesthesia Postprocedure Evaluation (Signed)
Anesthesia Post Note  Patient: Teresa [name]  Procedure(s) Performed: Procedure(s) (LRB): ESOPHAGOGASTRODUODENOSCOPY (EGD) WITH PROPOFOL (N/A) COLONOSCOPY WITH PROPOFOL (N/A)  Anesthesia type: MAC  Patient location: PACU  Post pain: Pain level controlled  Post assessment: Patient's Cardiovascular Status Stable  Last Vitals:  Filed Vitals:   01/11/15 0920  BP: 111/87  Pulse: 80  Temp:   Resp: 22    Post vital signs: Reviewed and stable  Level of consciousness: sedated  Complications: No apparent anesthesia complications

## 2015-01-12 ENCOUNTER — Encounter (HOSPITAL_COMMUNITY): Payer: Self-pay | Admitting: Gastroenterology

## 2015-02-02 ENCOUNTER — Other Ambulatory Visit: Payer: Self-pay | Admitting: Pharmacist

## 2015-02-02 MED ORDER — EVOLOCUMAB 140 MG/ML ~~LOC~~ SOAJ: 140.0000 mg | SUBCUTANEOUS | Status: DC

## 2015-02-08 ENCOUNTER — Other Ambulatory Visit: Payer: Self-pay | Admitting: Gastroenterology

## 2015-02-08 DIAGNOSIS — K599 Functional intestinal disorder, unspecified: Secondary | ICD-10-CM

## 2015-02-21 ENCOUNTER — Ambulatory Visit
Admission: RE | Admit: 2015-02-21 | Discharge: 2015-02-21 | Disposition: A | Discharge status: Home / Self Care | Payer: BLUE CROSS/BLUE SHIELD | Source: Ambulatory Visit | Attending: Gastroenterology | Admitting: Gastroenterology

## 2015-02-21 DIAGNOSIS — K599 Functional intestinal disorder, unspecified: Secondary | ICD-10-CM

## 2015-03-23 ENCOUNTER — Other Ambulatory Visit: Payer: Self-pay | Admitting: Pharmacist

## 2015-03-23 ENCOUNTER — Telehealth: Payer: Self-pay | Admitting: Pharmacist

## 2015-03-23 MED ORDER — EVOLOCUMAB 140 MG/ML ~~LOC~~ SOAJ: 140.0000 mg | SUBCUTANEOUS | Status: DC

## 2015-03-23 NOTE — Telephone Encounter (Signed)
LM to return call about Repatha/insurance change.

## 2015-03-23 NOTE — Progress Notes (Signed)
Pt called to report she has a new preferred specialty pharmacy - it changed from [facility] to [facility] with [facility]. Sent Repatha rx to [facility].

## 2015-03-29 ENCOUNTER — Telehealth: Payer: Self-pay | Admitting: *Deleted

## 2015-03-29 NOTE — Telephone Encounter (Signed)
  Oncology Nurse Navigator Documentation  Navigator Location: [facility] (03/29/15 1200) Navigator Encounter Type: Telephone (left voice mail message with contact information) (03/29/15 1200)  I called patient to check in.  I left a voice mail message with contact information and request that she return my call.           Treatment Phase: Follow-up (03/29/15 1200)

## 2015-03-29 NOTE — Telephone Encounter (Signed)
  Oncology Nurse Navigator Documentation  Navigator Location: [facility] (03/29/15 1446) Navigator Encounter Type: Telephone (patient returned call) (03/29/15 1446)  I called patient to check in and left a message.  She returned my call and reports that she is doing very well.  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  We discussed that she is now on follow up visits annually.  I reminded her that she can call me for any questions or concerns.  She denies any needs at this time.           Treatment Phase: Follow-up (03/29/15 1446) Barriers/Navigation Needs: No Questions;No Needs (03/29/15 1446)   Interventions: None required (03/29/15 1446)            Acuity: Level 1 (03/29/15 1446) Acuity Level 1: Minimal follow up required (03/29/15 1446)       Time Spent with Patient: 15 (03/29/15 1446)

## 2015-04-07 NOTE — Telephone Encounter (Signed)
Pt calling to report that she received a shipment of Repatha a few days ago and there was no trouble with it being approved by her insurance.

## 2015-07-12 ENCOUNTER — Telehealth: Payer: Self-pay | Admitting: Interventional Cardiology

## 2015-07-12 NOTE — Telephone Encounter (Signed)
New Message  Prior Auth   Pt c/o medication issue: 1. Name of Medication: Evolocumab (REPATHA SURECLICK) 546 MG/ML SOAJ  4. What is your medication issue? Prior auth needed

## 2015-07-14 NOTE — Telephone Encounter (Signed)
Per message left on the refill voicemail from [facility], the praluent requires prior authorization. She stated that forms have been faxed to the office. The call back number provided was [phone]. Thanks, MI

## 2015-07-15 NOTE — Telephone Encounter (Signed)
PA sent to [facility] on 5/12.

## 2015-08-17 NOTE — Progress Notes (Signed)
Patient ID: Teresa [name], female   DOB: Sep 27, 1950, 65 y.o.   MRN: [id number]     Cardiology Office Note   Date:  08/18/2015   ID:  Teresa, [name] May 13, 1950, MRN [id number]  PCP:  Teresa Dawson, MD    No chief complaint on file. CAD   Wt Readings from Last 3 Encounters:  08/18/15 132 lb (59.875 kg)  01/11/15 143 lb (64.864 kg)  01/10/15 143 lb 9.6 oz (65.137 kg)       History of Present Illness: Teresa [name] is a 65 y.o. female  who has had a complex left main , circumflex , LAD stenting after a dissection during PCI in 8/14. She had PCI of the RCA complicated by dissection and required 3 stents. She had successful lung surgery to remove a malignancy in 12/14. She is doing well since that time. She has not had problems with regular walking. She has not had any excessive shortness of breath either.  CAD/ASCVD:  Denies :  Dizziness. Dyspnea on exertion. Fatigue. Nitroglycerin. Orthopnea. Palpitations. The isosorbide was Stopped due to headache.   She has occasional spasms in her chest- where her lobectomy was, near the incision.  THey last a second at the most and resolve spontaneously.  No prolonged pain.  No pain with exercise.  Walking and child care are her most strenuous exercises  She has lost 20 lbs with increased exercise and better diet.      Past Medical History  Diagnosis Date  . GERD (gastroesophageal reflux disease)   . Hypercholesterolemia   . Seasonal allergies   . Tobacco abuse   . Cataract     "just the beginnings on the right" (02/27/2013)  . Aortic valve stenosis, moderate   . Heart murmur   . Mass of lung     "small tumor RUL; they are watching it" (02/27/2013)  . Cellulitis 06/20/2012    "dog bite; right forearm" (06/24/2012)  . Nonspecific abnormal unspecified cardiovascular function study   . Peripheral vascular disease, unspecified ([place])   . CAD (coronary artery disease), native coronary artery   . Other and  unspecified angina pectoris   . Myocardial infarction [facility])     "think dr said I've had 2 silent one" (02/27/2013)  . COPD (chronic obstructive pulmonary disease) ([place])     "little bit" (02/27/2013)  . Pneumonia 1990's    "once" (02/27/2013)  . Chronic bronchitis ([place])     "used to have it alot; haven't had it in a long time" (02/27/2013)  . Exertional shortness of breath     "sometimes" (02/27/2013)  . Arthritis     "across my hips; buttocks; comes w/the weather" (02/27/2013)  . Tachycardia, unspecified     Past Surgical History  Procedure Laterality Date  . Mouth surgery  2010?    "for bone loss" (06/24/2012)  . Video bronchoscopy  09/12/2011    Procedure: VIDEO BRONCHOSCOPY WITH FLUORO;  Surgeon: Tanda Rockers, MD;  Location: [facility];  Service: Cardiopulmonary;  Laterality: Bilateral;  . Tonsillectomy  1960  . Ganglion cyst excision Left 1975    "wrist"  . Coronary angioplasty with stent placement  10/2012; 12/08/2012    "7 + 3" (02/27/2013)  . Flexible bronchoscopy N/A 03/02/2013    Procedure: FLEXIBLE BRONCHOSCOPY;  Surgeon: Gaye Pollack, MD;  Location: [place];  Service: Thoracic;  Laterality: N/A;  . Thoracotomy/lobectomy Right 03/02/2013    Procedure: Right Video Assisted Thoracoscopy/Thoracotomy with upper Lobectomy;  Surgeon:  Gaye Pollack, MD;  Location: [facility] OR;  Service: Thoracic;  Laterality: Right;  Right Lung Upper  Lobectomy   . Percutaneous coronary stent intervention (pci-s) N/A 10/09/2012    Procedure: PERCUTANEOUS CORONARY STENT INTERVENTION (PCI-S);  Surgeon: Jettie Booze, MD;  Location: [facility] CATH LAB;  Service: Cardiovascular;  Laterality: N/A;  . Left heart catheterization with coronary angiogram N/A 12/08/2012    Procedure: LEFT HEART CATHETERIZATION WITH CORONARY ANGIOGRAM;  Surgeon: Jettie Booze, MD;  Location: [facility] CATH LAB;  Service: Cardiovascular;  Laterality: N/A;  . Esophagogastroduodenoscopy (egd) with propofol N/A 01/11/2015     Procedure: ESOPHAGOGASTRODUODENOSCOPY (EGD) WITH PROPOFOL;  Surgeon: Garlan Fair, MD;  Location: [facility];  Service: Endoscopy;  Laterality: N/A;  . Flexible sigmoidoscopy N/A 01/11/2015    Procedure: FLEXIBLE SIGMOIDOSCOPY;  Surgeon: Garlan Fair, MD;  Location: [facility];  Service: Endoscopy;  Laterality: N/A;  unable to complete colon-prep issues     Current Outpatient Prescriptions  Medication Sig Dispense Refill  . acetaminophen (TYLENOL) 500 MG tablet Take 1,000 mg by mouth 3 (three) times daily as needed for pain.     [name] albuterol (PROVENTIL HFA) 108 (90 BASE) MCG/ACT inhaler Inhale 2 puffs into the lungs 2 (two) times daily as needed for wheezing or shortness of breath.     . cholecalciferol (VITAMIN D) 1000 UNITS tablet Take 1 tablet (1,000 Units total) by mouth at bedtime. 90 tablet 0  . clopidogrel (PLAVIX) 75 MG tablet Take 1 tablet (75 mg total) by mouth daily. 90 tablet 3  . Evolocumab (REPATHA SURECLICK) 073 MG/ML SOAJ Inject 140 mg into the skin every 14 (fourteen) days. 2 pen 11  . fluticasone (FLONASE) 50 MCG/ACT nasal spray INHALE 2 SPRAYS IN EACH NOSTRIL AS NEEDED DAILY for allergies  4  . lansoprazole (PREVACID) 30 MG capsule Take 1 capsule (30 mg total) by mouth daily.    [name] NASAL SALINE NA Place 1 spray into the nose daily as needed (congestion).     . nitroGLYCERIN (NITROSTAT) 0.4 MG SL tablet Place 1 tablet (0.4 mg total) under the tongue every 5 (five) minutes as needed for chest pain. 25 tablet 4  . OVER THE COUNTER MEDICATION Take 1 tablet by mouth daily with lunch. [facility] Menopause support    . ranitidine (ZANTAC) 150 MG tablet Take 150 mg by mouth daily as needed for heartburn.      No current facility-administered medications for this visit.    Allergies:   Azithromycin; Ceclor; Lipitor; Morphine and related; Penicillins; Septra; Simvastatin; Vicodin; Zetia; Doxycycline; and Adhesive    Social History:  The patient  reports that she quit smoking  about 2 years ago. Her smoking use included Cigarettes. She has a 20 pack-year smoking history. She has never used smokeless tobacco. She reports that she does not drink alcohol or use illicit drugs.   Family History:  The patient's *family history includes Alcohol abuse in her father; Lupus in her mother; Migraines in her sister; Other in her sister; Pulmonary fibrosis in her brother. There is no history of Heart attack.    ROS:  Please see the history of present illness.   Otherwise, review of systems are positive for chest spasms as noted above.   All other systems are reviewed and negative.    PHYSICAL EXAM: VS:  BP 124/82 mmHg  Pulse 76  Ht 4' 11.5" (1.511 m)  Wt 132 lb (59.875 kg)  BMI 26.23 kg/m2  LMP 10/04/1999 , BMI Body mass  index is 26.23 kg/(m^2). GEN: Well nourished, well developed, in no acute distress HEENT: normal Neck: no JVD, carotid bruits, or masses Cardiac: RRR; 3/6 systolic murmur, rubs, or gallops,no edema  Respiratory:  clear to auscultation bilaterally, normal work of breathing GI: soft, nontender, nondistended, + BS MS: no deformity or atrophy Skin: warm and dry, no rash Neuro:  Strength and sensation are intact Psych: euthymic mood, full affect   EKG:   The ekg ordered today demonstrates NSR, septal Q waves   Recent Labs: 01/03/2015: BUN 9.9; Creatinine 0.8; HGB 12.9; Platelets 261; Potassium 5.0; Sodium 141 01/10/2015: ALT 13   Lipid Panel    Component Value Date/Time   CHOL 94* 01/10/2015 1236   TRIG 125 01/10/2015 1236   HDL 56 01/10/2015 1236   CHOLHDL 1.7 01/10/2015 1236   VLDL 25 01/10/2015 1236   LDLCALC 13 01/10/2015 1236     Other studies Reviewed: Additional studies/ records that were reviewed today with results demonstrating: CathFindings as noted above.   ASSESSMENT AND PLAN:  1. CAD: P2Y12 testing showed Plavix was adequate antiplatelet therapy. No further chest discomfort since RCA stents. Left-sided stents were widely  patent. Her lung cancer surgery was done in December 2014.She had a moderate lesion in the proximal right coronary artery but it did not appear to be hemodynamically significant. She has restarted her DAPT without bleeding problems. Chest and shoulder pain seem atypical for ischemic pain. OK to stop aspirin at this point.  Will continue plavix.  OK to hold plavix for colonoscopy, for 5 days prior.  2. Hyperlipidemia: Last LDL was 136 while she is on Crestor 10 mg 3 times a week. Increased crestor 4x/week but she did not tolerate this due to leg pain and muscle aches.Did not tolerate Zocor and lipitor due to muscle pains. Did not tolerate Zetia due to stomach pains. Started PCSK9 inhibitor, Repatha and LDL has been low. Now off of Crestor. 3. Aortic stenosis: Mild to moderate in the past. No sx of severe AS.  Murmur stable.  Sx not correlating to the murmur intensity. 4. Working on weight loss. Gained weight after stopping smoking. Try to increase walking to 150 minutes/week. Discussed the Mediterranean diet at her last visit which she is trying. She has had success with a high fiber diet in the past to lose weight. She will try high fiber diet in the past. 5. Chest pain, she has several episodes of atypical chest pain which is very short-lived. It does not last long enough for her to even get a nitroglycerin tablet. If she has any prolonged discomfort, this would prompt [place] to do an ischemic evaluation. What she describes now I think is unlikely to be ischemia. She will let us know if symptoms get worse.  THis is likely related to her prior lung surgery.   Current medicines are reviewed at length with the patient today.  The patient concerns regarding her medicines were addressed.  The following changes have been made:  After 8/16, she can stop aspirin.  Labs/ tests ordered today include:   Orders Placed This Encounter  Procedures  . EKG 12-Lead    Recommend 150 minutes/week of aerobic  exercise Low fat, low carb, high fiber diet recommended  Disposition:   FU in 1 year   Signed, Larae Grooms, MD  08/18/2015 8:17 AM    [facility] [place], [place], [place]  [address] Phone: [phone]; Fax: [phone]

## 2015-08-18 ENCOUNTER — Encounter: Payer: Self-pay | Admitting: Interventional Cardiology

## 2015-08-18 ENCOUNTER — Ambulatory Visit (INDEPENDENT_AMBULATORY_CARE_PROVIDER_SITE_OTHER): Payer: Self-pay | Admitting: Interventional Cardiology

## 2015-08-18 VITALS — BP 124/82 | HR 76 | Ht 59.5 in | Wt 132.0 lb

## 2015-08-18 DIAGNOSIS — I2510 Atherosclerotic heart disease of native coronary artery without angina pectoris: Secondary | ICD-10-CM

## 2015-08-18 DIAGNOSIS — I350 Nonrheumatic aortic (valve) stenosis: Secondary | ICD-10-CM

## 2015-08-18 DIAGNOSIS — E782 Mixed hyperlipidemia: Secondary | ICD-10-CM

## 2015-08-18 DIAGNOSIS — R0782 Intercostal pain: Secondary | ICD-10-CM

## 2015-08-18 NOTE — Patient Instructions (Signed)
Medication Instructions:  Same-no changes  Labwork: Today-Lipids and CMET  Testing/Procedures: None  Follow-Up: Your physician wants you to follow-up in: 1 year. You will receive a reminder letter in the mail two months in advance. If you don't receive a letter, please call our office to schedule the follow-up appointment.     If you need a refill on your cardiac medications before your next appointment, please call your pharmacy.

## 2015-08-19 LAB — COMPREHENSIVE METABOLIC PANEL
ALBUMIN: 4.5 g/dL (ref 3.6–5.1)
ALT: 12 U/L (ref 6–29)
AST: 15 U/L (ref 10–35)
Alkaline Phosphatase: 90 U/L (ref 33–130)
BUN: 15 mg/dL (ref 7–25)
CALCIUM: 9.8 mg/dL (ref 8.6–10.4)
CHLORIDE: 103 mmol/L (ref 98–110)
CO2: 29 mmol/L (ref 20–31)
Creat: 0.71 mg/dL (ref 0.50–0.99)
GLUCOSE: 105 mg/dL — AB (ref 65–99)
Potassium: 4.3 mmol/L (ref 3.5–5.3)
SODIUM: 139 mmol/L (ref 135–146)
Total Bilirubin: 0.9 mg/dL (ref 0.2–1.2)
Total Protein: 7.3 g/dL (ref 6.1–8.1)

## 2015-08-19 LAB — LIPID PANEL
CHOL/HDL RATIO: 2.3 ratio (ref <=5.0)
Cholesterol: 152 mg/dL (ref 125–200)
HDL: 65 mg/dL (ref >=46)
LDL CALC: 66 mg/dL (ref <130)
TRIGLYCERIDES: 105 mg/dL (ref <150)
VLDL: 21 mg/dL (ref <30)

## 2015-10-17 ENCOUNTER — Other Ambulatory Visit: Payer: Self-pay | Admitting: *Deleted

## 2015-10-17 MED ORDER — CLOPIDOGREL BISULFATE 75 MG PO TABS: 75.0000 mg | ORAL_TABLET | Freq: Every day | ORAL | 2 refills | Status: DC

## 2015-11-18 ENCOUNTER — Ambulatory Visit
Admission: RE | Admit: 2015-11-18 | Discharge: 2015-11-18 | Disposition: A | Discharge status: Home / Self Care | Payer: PRIVATE HEALTH INSURANCE | Source: Ambulatory Visit | Attending: Internal Medicine | Admitting: Internal Medicine

## 2015-11-18 ENCOUNTER — Other Ambulatory Visit: Payer: Self-pay | Admitting: Internal Medicine

## 2015-11-18 DIAGNOSIS — M25532 Pain in left wrist: Secondary | ICD-10-CM

## 2016-01-03 ENCOUNTER — Ambulatory Visit (HOSPITAL_COMMUNITY)
Admission: RE | Admit: 2016-01-03 | Discharge: 2016-01-03 | Disposition: A | Discharge status: Home / Self Care | Payer: 59 | Source: Ambulatory Visit | Attending: Internal Medicine | Admitting: Internal Medicine

## 2016-01-03 ENCOUNTER — Other Ambulatory Visit (HOSPITAL_BASED_OUTPATIENT_CLINIC_OR_DEPARTMENT_OTHER): Payer: PRIVATE HEALTH INSURANCE

## 2016-01-03 DIAGNOSIS — Z85118 Personal history of other malignant neoplasm of bronchus and lung: Secondary | ICD-10-CM

## 2016-01-03 DIAGNOSIS — M47814 Spondylosis without myelopathy or radiculopathy, thoracic region: Secondary | ICD-10-CM | POA: Diagnosis not present

## 2016-01-03 DIAGNOSIS — I2510 Atherosclerotic heart disease of native coronary artery without angina pectoris: Secondary | ICD-10-CM | POA: Insufficient documentation

## 2016-01-03 DIAGNOSIS — Z902 Acquired absence of lung [part of]: Secondary | ICD-10-CM | POA: Diagnosis not present

## 2016-01-03 DIAGNOSIS — C3411 Malignant neoplasm of upper lobe, right bronchus or lung: Secondary | ICD-10-CM | POA: Insufficient documentation

## 2016-01-03 DIAGNOSIS — I700 Atherosclerosis of aorta: Secondary | ICD-10-CM | POA: Diagnosis not present

## 2016-01-03 DIAGNOSIS — J439 Emphysema, unspecified: Secondary | ICD-10-CM | POA: Diagnosis not present

## 2016-01-03 LAB — CBC WITH DIFFERENTIAL/PLATELET
BASO%: 0.6 % (ref 0.0–2.0)
Basophils Absolute: 0 10*3/uL (ref 0.0–0.1)
EOS ABS: 0.2 10*3/uL (ref 0.0–0.5)
EOS%: 2.7 % (ref 0.0–7.0)
HCT: 44.5 % (ref 34.8–46.6)
HEMOGLOBIN: 14.6 g/dL (ref 11.6–15.9)
LYMPH%: 31.9 % (ref 14.0–49.7)
MCH: 29.8 pg (ref 25.1–34.0)
MCHC: 32.8 g/dL (ref 31.5–36.0)
MCV: 90.8 fL (ref 79.5–101.0)
MONO#: 0.5 10*3/uL (ref 0.1–0.9)
MONO%: 8.8 % (ref 0.0–14.0)
NEUT%: 56 % (ref 38.4–76.8)
NEUTROS ABS: 3.2 10*3/uL (ref 1.5–6.5)
Platelets: 282 10*3/uL (ref 145–400)
RBC: 4.91 10*6/uL (ref 3.70–5.45)
RDW: 13.3 % (ref 11.2–14.5)
WBC: 5.7 10*3/uL (ref 3.9–10.3)
lymph#: 1.8 10*3/uL (ref 0.9–3.3)

## 2016-01-03 LAB — COMPREHENSIVE METABOLIC PANEL
ALBUMIN: 4 g/dL (ref 3.5–5.0)
ALK PHOS: 96 U/L (ref 40–150)
ALT: 15 U/L (ref 0–55)
AST: 17 U/L (ref 5–34)
Anion Gap: 8 mEq/L (ref 3–11)
BILIRUBIN TOTAL: 0.67 mg/dL (ref 0.20–1.20)
BUN: 15.6 mg/dL (ref 7.0–26.0)
CO2: 29 mEq/L (ref 22–29)
Calcium: 9.9 mg/dL (ref 8.4–10.4)
Chloride: 103 mEq/L (ref 98–109)
Creatinine: 0.8 mg/dL (ref 0.6–1.1)
EGFR: 84 mL/min/{1.73_m2} — ABNORMAL LOW (ref >90)
GLUCOSE: 105 mg/dL (ref 70–140)
Potassium: 5.5 mEq/L — ABNORMAL HIGH (ref 3.5–5.1)
SODIUM: 140 meq/L (ref 136–145)
TOTAL PROTEIN: 8.5 g/dL — AB (ref 6.4–8.3)

## 2016-01-03 MED ORDER — IOPAMIDOL (ISOVUE-300) INJECTION 61%
75.0000 mL | Freq: Once | INTRAVENOUS | Status: AC | PRN
  Administered 2016-01-03: 75 mL via INTRAVENOUS

## 2016-01-10 ENCOUNTER — Telehealth: Payer: Self-pay | Admitting: Internal Medicine

## 2016-01-10 ENCOUNTER — Ambulatory Visit (HOSPITAL_BASED_OUTPATIENT_CLINIC_OR_DEPARTMENT_OTHER): Payer: 59 | Admitting: Internal Medicine

## 2016-01-10 ENCOUNTER — Encounter: Payer: Self-pay | Admitting: Internal Medicine

## 2016-01-10 VITALS — BP 146/64 | HR 83 | Temp 98.5°F | Resp 17 | Ht 59.0 in | Wt 134.1 lb

## 2016-01-10 DIAGNOSIS — C3411 Malignant neoplasm of upper lobe, right bronchus or lung: Secondary | ICD-10-CM | POA: Diagnosis not present

## 2016-01-10 NOTE — Progress Notes (Signed)
[place] [phone]   [phone]  OFFICE PROGRESS NOTE  Ileana Roup, MD [address]  [address] [place] [address]  DIAGNOSIS: Stage IB (T2a., N0, M0) poorly differentiated squamous cell carcinoma, diagnosed in December of 2014.  PRIOR THERAPY: Flexible video bronchoscopy, right upper lobectomy with mediastinal lymph node dissection under the care of Dr. Cyndia Bent.   CURRENT THERAPY: Observation.  INTERVAL HISTORY: Teresa [name] 65 y.o. female returns to the clinic today for annual followup visit. The patient is feeling fine today with no specific complaints. She denied having any significant shortness of breath, cough or hemoptysis. She has no fever or chills, no nausea or vomiting. The patient had repeat CT scan of the chest performed recently and she is here for evaluation and discussion of her scan results.   MEDICAL HISTORY: Past Medical History:  Diagnosis Date  . Aortic valve stenosis, moderate   . Arthritis    "across my hips; buttocks; comes w/the weather" (02/27/2013)  . CAD (coronary artery disease), native coronary artery   . Cataract    "just the beginnings on the right" (02/27/2013)  . Cellulitis 06/20/2012   "dog bite; right forearm" (06/24/2012)  . Chronic bronchitis ([place])    "used to have it alot; haven't had it in a long time" (02/27/2013)  . COPD (chronic obstructive pulmonary disease) ([place])    "little bit" (02/27/2013)  . Exertional shortness of breath    "sometimes" (02/27/2013)  . GERD (gastroesophageal reflux disease)   . Heart murmur   . Hypercholesterolemia   . Mass of lung    "small tumor RUL; they are watching it" (02/27/2013)  . Myocardial infarction    "think dr said I've had 2 silent one" (02/27/2013)  . Nonspecific abnormal unspecified cardiovascular function study   . Other and unspecified angina pectoris ([place])   . Peripheral vascular disease, unspecified ([place])   . Pneumonia 1990's   "once" (02/27/2013)  . Seasonal allergies   . Tachycardia, unspecified   . Tobacco abuse     ALLERGIES:  is allergic to azithromycin; ceclor [cefaclor]; lipitor [atorvastatin]; morphine and related; penicillins; septra [sulfamethoxazole-trimethoprim]; simvastatin; vicodin [hydrocodone-acetaminophen]; zetia [ezetimibe]; doxycycline; and adhesive [tape].  MEDICATIONS:  Current Outpatient Prescriptions  Medication Sig Dispense Refill  . acetaminophen (TYLENOL) 500 MG tablet Take 1,000 mg by mouth 3 (three) times daily as needed for pain.     [name] albuterol (PROVENTIL HFA) 108 (90 BASE) MCG/ACT inhaler Inhale 2 puffs into the lungs 2 (two) times daily as needed for wheezing or shortness of breath.     . cholecalciferol (VITAMIN D) 1000 UNITS tablet Take 1 tablet (1,000 Units total) by mouth at bedtime. 90 tablet 0  . clopidogrel (PLAVIX) 75 MG tablet Take 1 tablet (75 mg total) by mouth daily. 90 tablet 2  . Evolocumab (REPATHA SURECLICK) 818 MG/ML SOAJ Inject 140 mg into the skin every 14 (fourteen) days. 2 pen 11  . fluticasone (FLONASE) 50 MCG/ACT nasal spray INHALE 2 SPRAYS IN EACH NOSTRIL AS NEEDED DAILY for allergies  4  . lansoprazole (PREVACID) 30 MG capsule Take 1 capsule (30 mg total) by mouth daily.    [name] OVER THE COUNTER MEDICATION Take 1 tablet by mouth daily with lunch. [facility] Menopause support    . PENNSAID 2 % SOLN     . ranitidine (ZANTAC) 150 MG tablet Take 150 mg by mouth daily as needed for heartburn.     [name] NASAL SALINE  NA Place 1 spray into the nose daily as needed (congestion).     . nitroGLYCERIN (NITROSTAT) 0.4 MG SL tablet Place 1 tablet (0.4 mg total) under the tongue every 5 (five) minutes as needed for chest pain. (Patient not taking: Reported on 01/10/2016) 25 tablet 4   No current facility-administered medications for this visit.     SURGICAL HISTORY:  Past Surgical History:  Procedure Laterality Date  . CORONARY ANGIOPLASTY WITH STENT PLACEMENT  10/2012; 12/08/2012    "7 + 3" (02/27/2013)  . ESOPHAGOGASTRODUODENOSCOPY (EGD) WITH PROPOFOL N/A 01/11/2015   Procedure: ESOPHAGOGASTRODUODENOSCOPY (EGD) WITH PROPOFOL;  Surgeon: Garlan Fair, MD;  Location: [facility];  Service: Endoscopy;  Laterality: N/A;  . FLEXIBLE BRONCHOSCOPY N/A 03/02/2013   Procedure: FLEXIBLE BRONCHOSCOPY;  Surgeon: Gaye Pollack, MD;  Location: [place];  Service: Thoracic;  Laterality: N/A;  . FLEXIBLE SIGMOIDOSCOPY N/A 01/11/2015   Procedure: FLEXIBLE SIGMOIDOSCOPY;  Surgeon: Garlan Fair, MD;  Location: [facility];  Service: Endoscopy;  Laterality: N/A;  unable to complete colon-prep issues  . GANGLION CYST EXCISION Left 1975   "wrist"  . LEFT HEART CATHETERIZATION WITH CORONARY ANGIOGRAM N/A 12/08/2012   Procedure: LEFT HEART CATHETERIZATION WITH CORONARY ANGIOGRAM;  Surgeon: Jettie Booze, MD;  Location: [facility] CATH LAB;  Service: Cardiovascular;  Laterality: N/A;  . MOUTH SURGERY  2010?   "for bone loss" (06/24/2012)  . PERCUTANEOUS CORONARY STENT INTERVENTION (PCI-S) N/A 10/09/2012   Procedure: PERCUTANEOUS CORONARY STENT INTERVENTION (PCI-S);  Surgeon: Jettie Booze, MD;  Location[facility] LAB;  Service: Cardiovascular;  Laterality: N/A;  . THORACOTOMY/LOBECTOMY Right 03/02/2013   Procedure: Right Video Assisted Thoracoscopy/Thoracotomy with upper Lobectomy;  Surgeon: Gaye Pollack, MD;  Location: [facility] OR;  Service: Thoracic;  Laterality: Right;  Right Lung Upper  Lobectomy   . TONSILLECTOMY  1960  . VIDEO BRONCHOSCOPY  09/12/2011   Procedure: VIDEO BRONCHOSCOPY WITH FLUORO;  Surgeon: Tanda Rockers, MD;  Location: [facility];  Service: Cardiopulmonary;  Laterality: Bilateral;    REVIEW OF SYSTEMS:  A comprehensive review of systems was negative.   PHYSICAL EXAMINATION: General appearance: alert, cooperative and no distress Head: Normocephalic, without obvious abnormality, atraumatic Neck: no adenopathy, no JVD, supple, symmetrical, trachea midline and thyroid not  enlarged, symmetric, no tenderness/mass/nodules Lymph nodes: Cervical, supraclavicular, and axillary nodes normal. Resp: clear to auscultation bilaterally Back: symmetric, no curvature. ROM normal. No CVA tenderness. Cardio: regular rate and rhythm, S1, S2 normal, no murmur, click, rub or gallop GI: soft, non-tender; bowel sounds normal; no masses,  no organomegaly Extremities: extremities normal, atraumatic, no cyanosis or edema  ECOG PERFORMANCE STATUS: 1 - Symptomatic but completely ambulatory  Blood pressure (!) 146/64, pulse 83, temperature 98.5 F (36.9 C), temperature source Oral, resp. rate 17, height '4\' 11"'$  (1.499 m), weight 134 lb 1.6 oz (60.8 kg), last menstrual period 10/04/1999, SpO2 99 %.  LABORATORY DATA: Lab Results  Component Value Date   WBC 5.7 01/03/2016   HGB 14.6 01/03/2016   HCT 44.5 01/03/2016   MCV 90.8 01/03/2016   PLT 282 01/03/2016      Chemistry      Component Value Date/Time   NA 140 01/03/2016 0823   K 5.5 (H) 01/03/2016 0823   CL 103 08/18/2015 0840   CO2 29 01/03/2016 0823   BUN 15.6 01/03/2016 0823   CREATININE 0.8 01/03/2016 0823      Component Value Date/Time   CALCIUM 9.9 01/03/2016 0823   ALKPHOS 96 01/03/2016 0823  AST 17 01/03/2016 0823   ALT 15 01/03/2016 0823   BILITOT 0.67 01/03/2016 0823       RADIOGRAPHIC STUDIES: Ct Chest W Contrast  Result Date: 01/03/2016 CLINICAL DATA:  Lung cancer. EXAM: CT CHEST WITH CONTRAST TECHNIQUE: Multidetector CT imaging of the chest was performed during intravenous contrast administration. CONTRAST:  45m ISOVUE-300 IOPAMIDOL (ISOVUE-300) INJECTION 61% COMPARISON:  01/03/2015 FINDINGS: Cardiovascular: The heart size appears within normal limits. Aortic atherosclerosis noted. Calcifications within the RCA, LAD and left circumflex coronary artery noted. Mediastinum/Nodes: The trachea appears patent and is midline. Normal appearance of the esophagus. No mediastinal or hilar adenopathy. There is no  pleural fluid. Lungs/Pleura: There are postoperative changes from previous thoracotomy and right upper lobectomy. Mild to moderate changes of paraseptal emphysema noted. There is diffuse bronchial wall thickening noted. Pleural-parenchymal scarring identified within the right apex. Within this area there is a 5 mm nodular density, image 28 of series 4. Stable from previous exam. Upper Abdomen: No acute abnormality. Musculoskeletal: Mild spondylosis identified within the thoracic spine. No aggressive lytic or sclerotic bone lesions. IMPRESSION: 1. No acute cardiopulmonary abnormalities. 2. No specific findings identified to suggest residual or recurrent tumor or metastatic disease status post right upper lobectomy. 3. Stable 5 mm right apical nodule. Attention in this nodule on follow-up imaging is advised. 4. Emphysema 5. Aortic atherosclerosis and multi vessel coronary artery calcification. Electronically Signed   By: TKerby MoorsM.D.   On: 01/03/2016 11:26   ASSESSMENT AND PLAN: this is a very pleasant 65years old white female with history of a stage IB non-small cell lung cancer status post right upper lobectomy with lymph node dissection and has been observation with no evidence for disease recurrence. I discussed the scan results with the patient today. I recommended for her to continue on observation with repeat CT scan of the chest in 1 year. She was advised to call immediately if she has any concerning symptoms in the interval. The patient voices understanding of current disease status and treatment options and is in agreement with the current care plan.  All questions were answered. The patient knows to call the clinic with any problems, questions or concerns. We can certainly see the patient much sooner if necessary.  Disclaimer: This note was dictated with voice recognition software. Similar sounding words can inadvertently be transcribed and may not be corrected upon review.

## 2016-01-10 NOTE — Telephone Encounter (Signed)
Appointments scheduled per 01/10/16 los. AVS report and appointment schedule given to patient, per 01/10/16 los.

## 2016-01-13 ENCOUNTER — Encounter: Payer: Self-pay | Admitting: Internal Medicine

## 2016-01-13 ENCOUNTER — Ambulatory Visit (INDEPENDENT_AMBULATORY_CARE_PROVIDER_SITE_OTHER): Payer: 59 | Admitting: Internal Medicine

## 2016-01-13 VITALS — BP 114/80 | HR 96 | Ht 59.5 in | Wt 133.0 lb

## 2016-01-13 DIAGNOSIS — R911 Solitary pulmonary nodule: Secondary | ICD-10-CM | POA: Diagnosis not present

## 2016-01-13 DIAGNOSIS — J449 Chronic obstructive pulmonary disease, unspecified: Secondary | ICD-10-CM

## 2016-01-13 NOTE — Patient Instructions (Addendum)
When coughing > take prevacid 30 mg Take 30-60 min before first meal of the day and pepcid ac 20 mg at bedtime until you stop coughing  GERD (REFLUX)  is an extremely common cause of respiratory symptoms just like yours , many times with no obvious heartburn at all.    It can be treated with medication, but also with lifestyle changes including elevation of the head of your bed (ideally with 6 inch  bed blocks),  Smoking cessation, avoidance of late meals, excessive alcohol, and avoid fatty foods, chocolate, peppermint, colas, red wine, and acidic juices such as orange juice.  NO MINT OR MENTHOL PRODUCTS SO NO COUGH DROPS  USE SUGARLESS CANDY INSTEAD (Jolley ranchers or Stover's or Life Savers) or even ice chips will also do - the key is to swallow to prevent all throat clearing. NO OIL BASED VITAMINS - use powdered substitutes.  You will need a follow up ct scan as directed by Dr Earlie Server - return here as needed

## 2016-01-13 NOTE — Progress Notes (Signed)
Subjective:    Patient ID: Teresa [name], female    DOB: [birth year]   MRN: [id number]     Brief patient profile:  89 yowf quit smoking July 2014  referred 08/24/2011 to pulmonary clinic  by Dr Maxwell Caul for evaluation of spn     History of Present Illness  08/24/2011 1st pulmonary ov cc acute onset chest congestion Dec 2012 > cxr with RUL nodule > ct Pos SPN but all symptoms resolved with no hemoptysis or R CP and good ex tol in between flares of bronchitis maybe once a year and rare need for saba hfa.  In meantime repeat CT Chest c/w evolving increase in density RUL lesion so referred to pulmonary. Has dx of mod to severe AS with most recent echo done 2 year prior to [place]  But yearly eval due w/in a week.  No ex cp or presyncope, denies any limiting sob with desired activities. rec You have a small nodule in right upper lobe that could be a atypical infection or an early tumor     DIAGNOSIS: Stage IB (T2a., N0, M0) poorly differentiated squamous cell carcinoma, diagnosed in December of 2014.  PRIOR THERAPY: Flexible video bronchoscopy, right upper lobectomy with mediastinal lymph node dissection under the care of Dr. Cyndia Bent.   CURRENT THERAPY: Observation as of ov 01/10/16 s any adjuvant rx    01/13/2016   Consultation Teresa [name] re:  Abn ct/ cough x 3 days on gerd rx tiw and cough drops  Chief Complaint  Patient presents with  . Pulmonary Consult    Referred by Dr. Earlie Server for eval of recent chest ct. She c/o cough for the past few days- prod with clear sputum.    Not limited by breathing from desired activities  / chasing toddlers around all s need for albuterol except in the spring and the fall    No obvious daytime variabilty or assoc  purulent sputum or mucus plugs   or cp or chest tightness, subjective wheeze overt sinus or hb symptoms. No unusual exp hx or h/o childhood pna/ asthma or premature birth to her knowledge.    Sleeping ok without nocturnal  or early am exacerbation  of  respiratory  c/o's or need for noct saba. Also denies any obvious fluctuation of symptoms with weather or environmental changes or other aggravating or alleviating factors except as outlined above   ROS  The following are not active complaints unless bolded sore throat, dysphagia, dental problems, itching, sneezing,  nasal congestion or excess/ purulent secretions, ear ache,   fever, chills, sweats, unintended wt loss, pleuritic or exertional cp, hemoptysis,  orthopnea pnd or leg swelling, presyncope, palpitations, heartburn, abdominal pain, anorexia, nausea, vomiting, diarrhea  or change in bowel or urinary habits, change in stools or urine, dysuria,hematuria,  rash, arthralgias, visual complaints, headache, numbness weakness or ataxia or problems with walking or coordination,  change in mood/affect or memory.             Objective:   Physical Exam    amb wf nad with nl vital signs - Note on arrival 02 sats  96% on RA    Wt 125  08/24/11 >  11/07/2011  122> 07/14/2012 123 >   01/13/2016   133   HEENT: nl dentition, turbinates, and orophanx. Nl external ear canals without cough reflex   NECK :  without JVD/Nodes/TM/ nl carotid upstrokes bilaterally   LUNGS: no acc muscle use, prominent pseudowheezing    CV:  RRR  III/VI  Sem no def increase in P2  ABD:  soft and nontender with nl excursion in the supine position. No bruits or organomegaly, bowel sounds nl  MS:  warm without deformities, calf tenderness, cyanosis or clubbing- R forearm with 4x4 cm sts swelling/purplish discoloration, no pus or erythema  SKIN: warm and dry without lesions       I personally reviewed images and agree with radiology impression as follows:  CT  Chest     01/03/16 1. No acute cardiopulmonary abnormalities. 2. No specific findings identified to suggest residual or recurrent tumor or metastatic disease status post right upper lobectomy. 3. Stable 5 mm right apical nodule. Attention in this nodule  on follow-up imaging is advised. 4. Emphysema 5. Aortic atherosclerosis and multi vessel coronary artery calcification.       Assessment & Plan:

## 2016-01-14 ENCOUNTER — Encounter: Payer: Self-pay | Admitting: Internal Medicine

## 2016-01-14 NOTE — Assessment & Plan Note (Addendum)
-   PFT's 11/07/2011 FEV1  0.96 (51%) ratio 50 and no better p B2, DLCO 78% - PFT's  09/02/2012  FEV1 1.38 (64 % ) ratio 66    with DLCO  73 % corrects to 93 % for alv volume   - quit smoking 09/2012  > 3 min di  I reviewed the [place] curve with the patient that basically indicates  if you quit smoking when your best day FEV1 is still well preserved (as is relatively true here)  it is highly unlikely you will progress to severe disease and informed the patient there was  no medication on the market that has proven to alter the curve/ its downward trajectory  or the likelihood of progression of their disease(unlike other chronic medical conditions such as atheroclerosis where we do think we can change the natural hx with risk reducing meds)    Therefore stopping smoking and maintaining abstinence is the most important aspect of care, not choice of inhalers or for that matter, doctors.   For now controls symptoms with just saba and that's fine as not limited from desired activities and no tendency to aecopd > pulmonary f/u is prn    To treat cough acutely since there is only pseudowheeze on exam  rec rx to include max short term gerd rx including avoiding mint an menthol products and pulmonary f/u for this can be prn as well   Total time devoted to counseling  = 35/90mreview case with pt/ discussion of options/alternatives/ personally creating written instructions  in presence of pt  then going over those specific  Instructions directly with the pt including how to use all of the meds but in particular covering each new medication in detail and the difference between the maintenance/automatic meds and the prns using an action plan format for the latter.

## 2016-01-14 NOTE — Assessment & Plan Note (Signed)
See CT chest 01/03/16  5 mm "RUL" nodule (pt is s/p RULobectomy)  CT results reviewed with pt >>> Too small for PET or bx, not suspicious enough for excisional bx > really only option for now is follow the Fleischner society guidelines as rec by radiology> one year f/u is reasonable or whatever surveillance Dr Earlie Server recommends at this point but no reason to independently follow this problem here  Discussed in detail all the  indications, usual  risks and alternatives  relative to the benefits with patient who agrees to proceed with conservative f/u as outlined

## 2016-01-30 ENCOUNTER — Other Ambulatory Visit: Payer: Self-pay | Admitting: Interventional Cardiology

## 2016-02-08 ENCOUNTER — Telehealth: Payer: Self-pay | Admitting: *Deleted

## 2016-02-08 NOTE — Progress Notes (Signed)
  Oncology Nurse Navigator Documentation  Navigator Location: [facility] (02/08/16 1732)   )Navigator Encounter Type: Telephone (02/08/16 1732)  I called patient to check in.  Patient reports that she is doing very well.  She has lost 20 pounds and wants to lose 10 more.  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  She denies any questions or concerns at this time. I encouraged her to call me for any needs.                   Patient Visit Type: Follow-up (02/08/16 1732) Treatment Phase: Follow-up (02/08/16 1732) Barriers/Navigation Needs: No barriers at this time;No Questions;No Needs (02/08/16 1732)   Interventions: None required (02/08/16 1732)                      Time Spent with Patient: 15 (02/08/16 1732)

## 2016-03-03 IMAGING — CT CT VIRTUAL COLONOSCOPY DIAGNOSTIC
4 of 7 series · 13 of 36 positions shown, 19 images · non-contrast
Comparison: PET-CT dated 08/22/2012

CLINICAL DATA: Incomplete colonoscopy, history of lung cancer
status post right lobectomy

EXAM:
CT VIRTUAL COLONOSCOPY DIAGNOSTIC
TECHNIQUE: The patient was given a standard Mag citrate bowel preparation with
Gastrografin and barium for fluid and stool tagging respectively.
The quality of the bowel preparation is moderate. Automated CO2
insufflation of the colon was performed prior to image acquisition
and colonic distention is moderate. Image post processing was used
to generate a 3D endoluminal fly-through projection of the colon and
to electronically subtract stool/fluid as appropriate.

[Series 2: supine (id) · axial · 0.73mm/px · z∈[-420,-358]mm · 2 of 392 slices shown]
[im 49/392  soft-tissue]
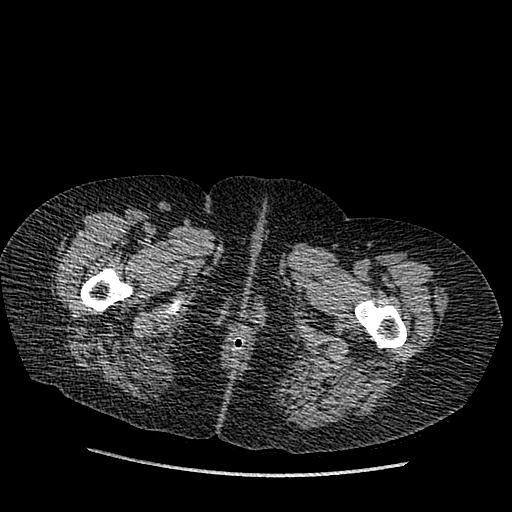
[im 98/392  soft-tissue]
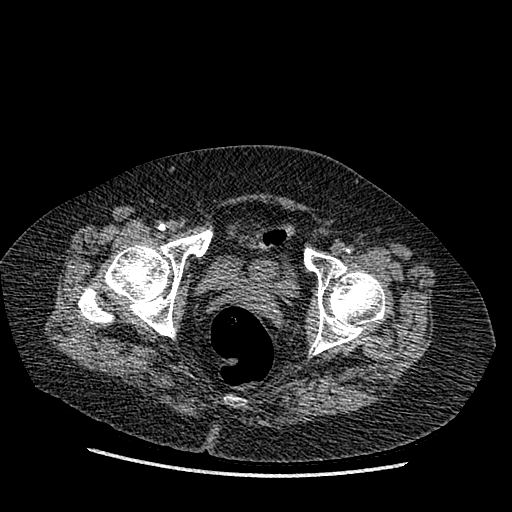

[Series 6: prone (id) · axial · 0.72mm/px · z∈[-440,-58]mm · 7 of 408 slices shown, 12 images (1 of 2)]
[im 51/408  soft-tissue]
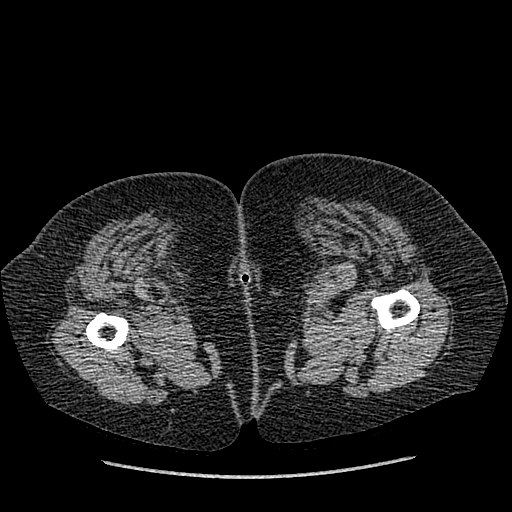
[im 51/408  bone]
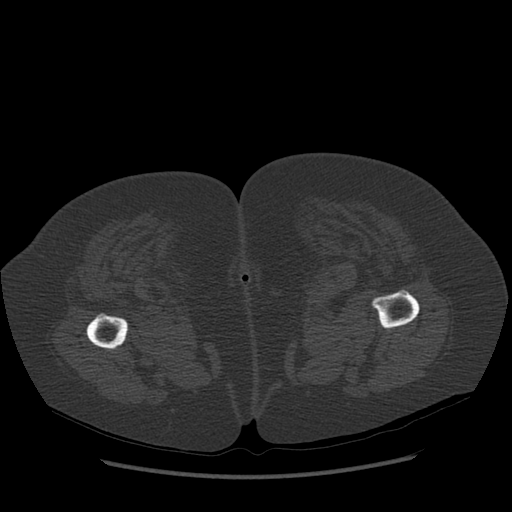
[im 102/408  soft-tissue]
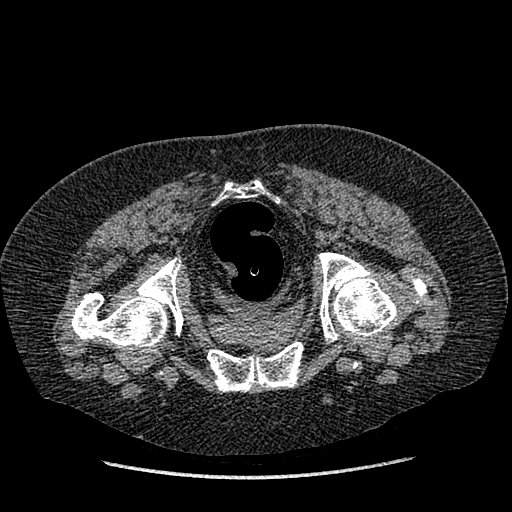
[im 153/408  soft-tissue]
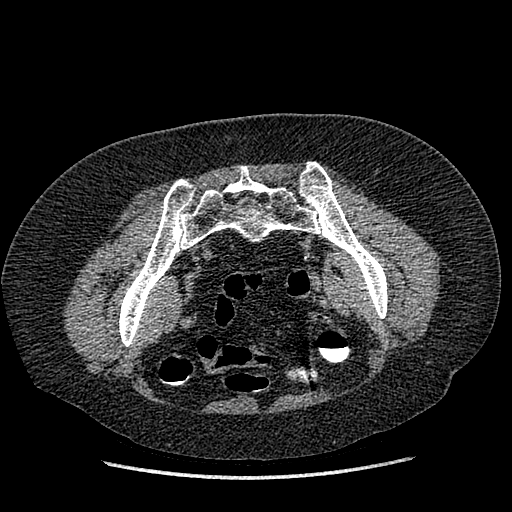
[im 204/408  soft-tissue]
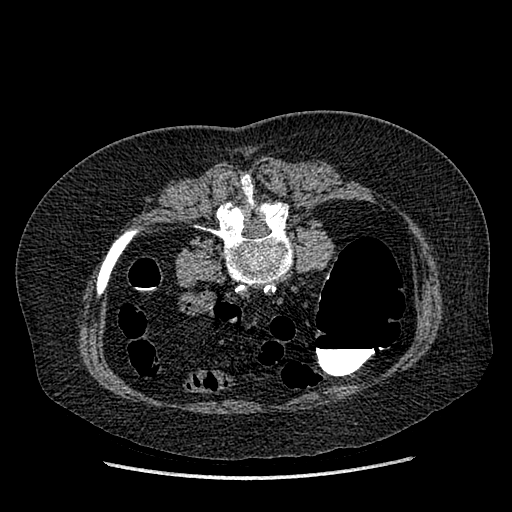
[im 204/408  lung]
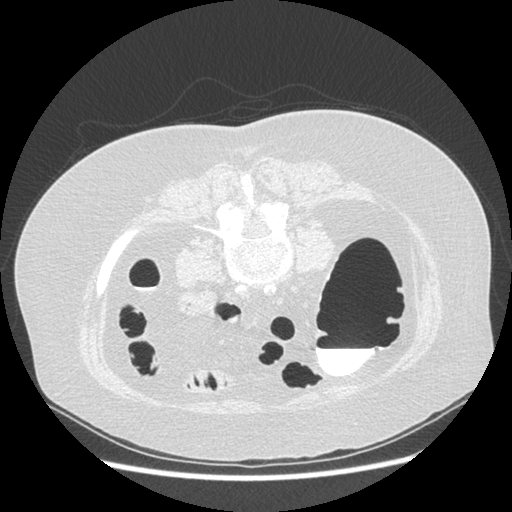
[im 255/408  soft-tissue]
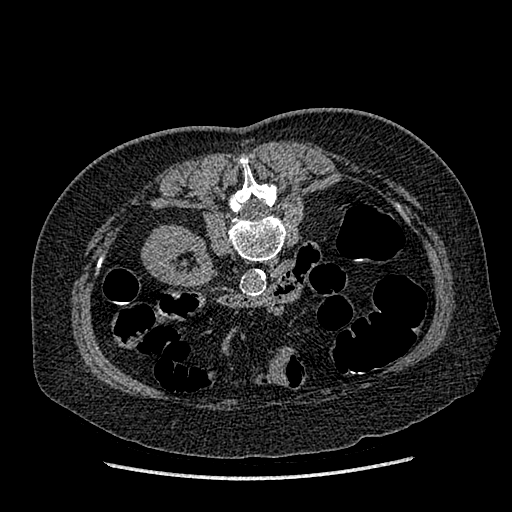
[im 255/408  lung]
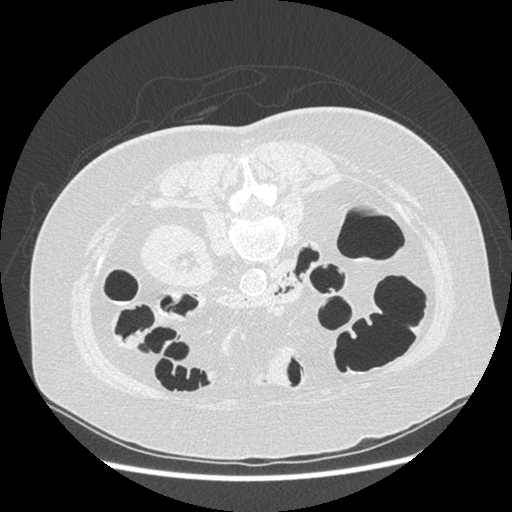
[im 306/408  soft-tissue]
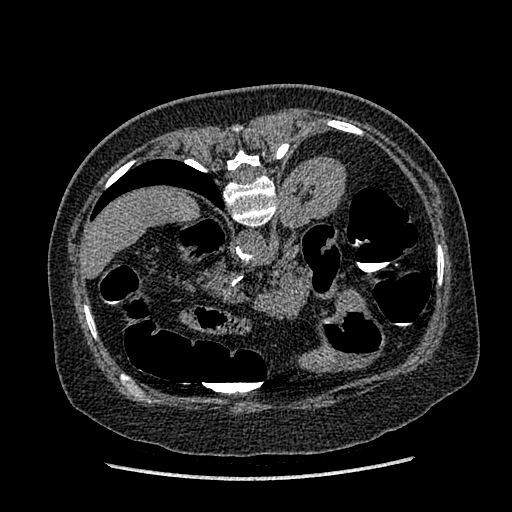
[im 306/408  lung]
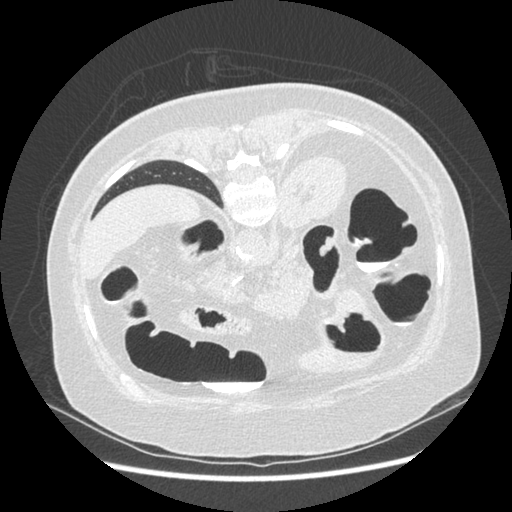
[im 357/408  soft-tissue]
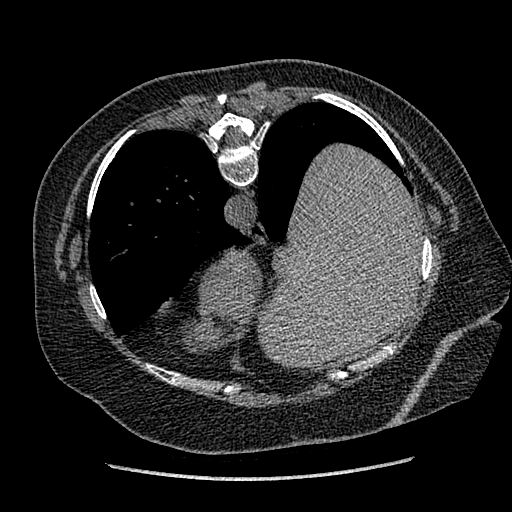
[im 357/408  lung]
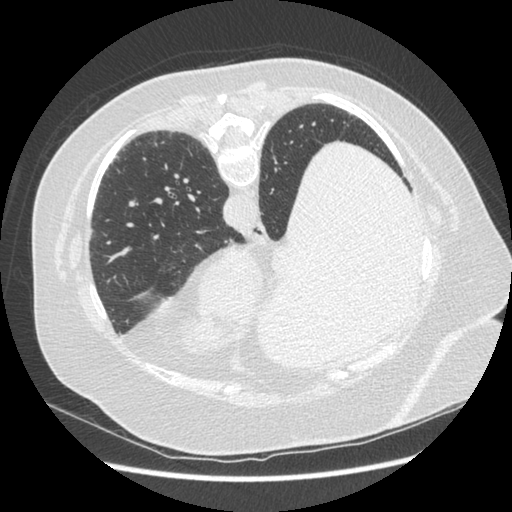

[Series 7: prone (id) · axial · 0.72mm/px · z∈[-376,-121]mm · 3 of 204 slices shown (2 of 2)]
[im 51/204  soft-tissue]
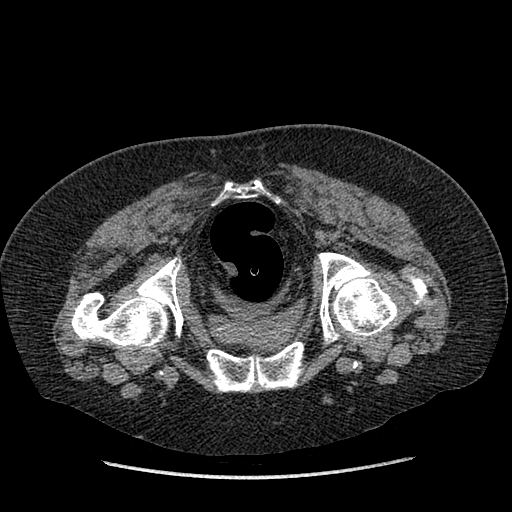
[im 102/204  soft-tissue]
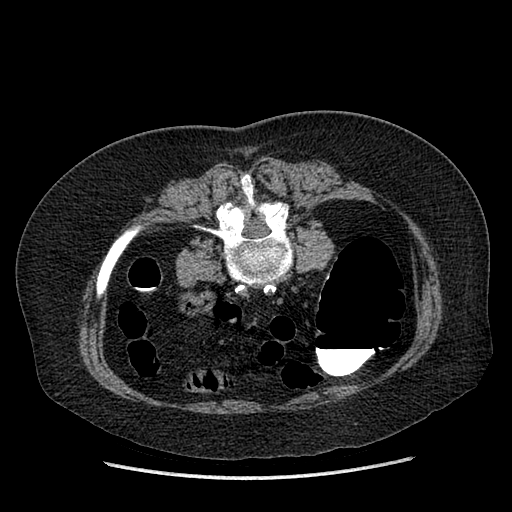
[im 153/204  soft-tissue]
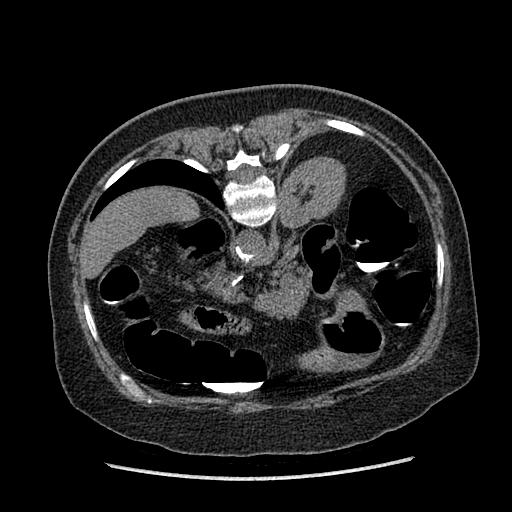

[Series 601: coronal body · coronal · 0.95mm/px · 1 of 115 slices shown, 2 images]
[im 39/115  soft-tissue]
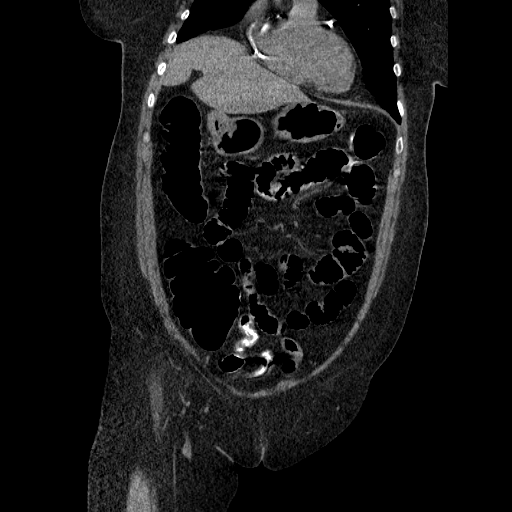
[im 39/115  bone]
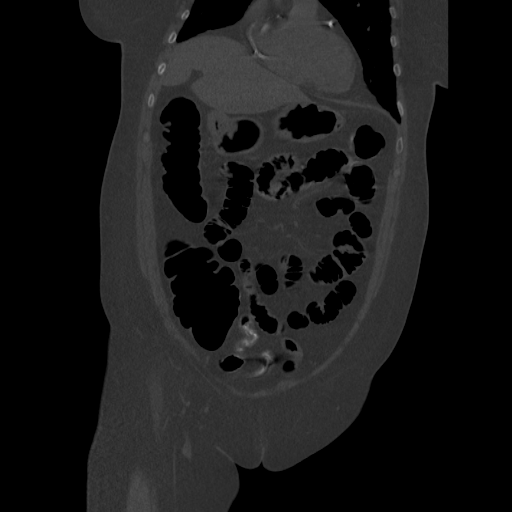

[13 of 36 positions shown; findings below may reference images not displayed]

FINDINGS: VIRTUAL COLONOSCOPY

Underdistention of the sigmoid colon, although this improves with
prominent imaging.

No significant colonic polyp, apple core lesion or mass, for colonic
stricture.

Colonic diverticulosis, without evidence of diverticulitis.

No evidence of bowel obstruction. Normal appendix (series 3/ image
124).

Virtual colonoscopy is not designed to detect diminutive polyps
(i.e., less than or equal to 5 mm), the presence or absence of which
may not affect clinical management.

CT ABDOMEN AND PELVIS WITHOUT CONTRAST

Lung bases are clear.  Coronary atherosclerosis.

Unenhanced liver, spleen, pancreas, and adrenal glands are within
normal limits.

Gallbladder is unremarkable. No intrahepatic or extrahepatic ductal
dilatation.

Kidneys are within normal limits. No renal, ureteral, or bladder
calculi. Bladder is underdistended but unremarkable.

Atherosclerotic calcifications of the abdominal aorta and branch
vessels. No abdominopelvic ascites. No suspicious abdominopelvic
lymphadenopathy.

Uterus is unremarkable.  No adnexal masses.

No focal osseous lesions.
IMPRESSION: No significant colonic polypoid lesion, mass, or stricture.

Unremarkable unenhanced CT abdomen/pelvis.

## 2016-05-03 ENCOUNTER — Telehealth: Payer: Self-pay | Admitting: *Deleted

## 2016-05-03 NOTE — Telephone Encounter (Signed)
"  How early can Lab and radiology scans be done.  I want to get these scheduled."  Advised it's too early.  Exams are not authorized yet.  Please call Radiology Centralized Scheduling the end of October to schedule.  No further questions.

## 2016-06-20 ENCOUNTER — Other Ambulatory Visit: Payer: Self-pay | Admitting: Interventional Cardiology

## 2016-06-28 ENCOUNTER — Encounter: Payer: Self-pay | Admitting: Physician Assistant

## 2016-06-28 ENCOUNTER — Observation Stay (HOSPITAL_COMMUNITY)
Admission: EM | Admit: 2016-06-28 | Discharge: 2016-06-30 | Disposition: A | Discharge status: Home / Self Care | Payer: Commercial Managed Care - PPO | Attending: Interventional Cardiology | Admitting: Interventional Cardiology

## 2016-06-28 ENCOUNTER — Ambulatory Visit (INDEPENDENT_AMBULATORY_CARE_PROVIDER_SITE_OTHER): Payer: Commercial Managed Care - PPO | Admitting: Physician Assistant

## 2016-06-28 ENCOUNTER — Telehealth: Payer: Self-pay | Admitting: Interventional Cardiology

## 2016-06-28 ENCOUNTER — Emergency Department (HOSPITAL_COMMUNITY): Payer: Commercial Managed Care - PPO

## 2016-06-28 ENCOUNTER — Encounter (HOSPITAL_COMMUNITY): Payer: Self-pay

## 2016-06-28 VITALS — BP 158/90 | HR 89 | Ht 59.0 in | Wt 134.6 lb

## 2016-06-28 DIAGNOSIS — I252 Old myocardial infarction: Secondary | ICD-10-CM | POA: Diagnosis not present

## 2016-06-28 DIAGNOSIS — I350 Nonrheumatic aortic (valve) stenosis: Secondary | ICD-10-CM

## 2016-06-28 DIAGNOSIS — Z885 Allergy status to narcotic agent status: Secondary | ICD-10-CM | POA: Diagnosis not present

## 2016-06-28 DIAGNOSIS — I2510 Atherosclerotic heart disease of native coronary artery without angina pectoris: Secondary | ICD-10-CM | POA: Diagnosis not present

## 2016-06-28 DIAGNOSIS — J449 Chronic obstructive pulmonary disease, unspecified: Secondary | ICD-10-CM | POA: Diagnosis present

## 2016-06-28 DIAGNOSIS — Z902 Acquired absence of lung [part of]: Secondary | ICD-10-CM | POA: Diagnosis not present

## 2016-06-28 DIAGNOSIS — Z880 Allergy status to penicillin: Secondary | ICD-10-CM | POA: Insufficient documentation

## 2016-06-28 DIAGNOSIS — Z7982 Long term (current) use of aspirin: Secondary | ICD-10-CM | POA: Insufficient documentation

## 2016-06-28 DIAGNOSIS — Z955 Presence of coronary angioplasty implant and graft: Secondary | ICD-10-CM | POA: Insufficient documentation

## 2016-06-28 DIAGNOSIS — I200 Unstable angina: Secondary | ICD-10-CM

## 2016-06-28 DIAGNOSIS — K219 Gastro-esophageal reflux disease without esophagitis: Secondary | ICD-10-CM | POA: Diagnosis not present

## 2016-06-28 DIAGNOSIS — R079 Chest pain, unspecified: Secondary | ICD-10-CM | POA: Diagnosis present

## 2016-06-28 DIAGNOSIS — Z7902 Long term (current) use of antithrombotics/antiplatelets: Secondary | ICD-10-CM | POA: Diagnosis not present

## 2016-06-28 DIAGNOSIS — E782 Mixed hyperlipidemia: Secondary | ICD-10-CM | POA: Diagnosis present

## 2016-06-28 DIAGNOSIS — Z87891 Personal history of nicotine dependence: Secondary | ICD-10-CM | POA: Insufficient documentation

## 2016-06-28 DIAGNOSIS — C3411 Malignant neoplasm of upper lobe, right bronchus or lung: Secondary | ICD-10-CM | POA: Insufficient documentation

## 2016-06-28 DIAGNOSIS — I25110 Atherosclerotic heart disease of native coronary artery with unstable angina pectoris: Principal | ICD-10-CM | POA: Insufficient documentation

## 2016-06-28 DIAGNOSIS — C3410 Malignant neoplasm of upper lobe, unspecified bronchus or lung: Secondary | ICD-10-CM | POA: Diagnosis present

## 2016-06-28 DIAGNOSIS — I739 Peripheral vascular disease, unspecified: Secondary | ICD-10-CM | POA: Diagnosis not present

## 2016-06-28 DIAGNOSIS — F17200 Nicotine dependence, unspecified, uncomplicated: Secondary | ICD-10-CM | POA: Diagnosis present

## 2016-06-28 DIAGNOSIS — E785 Hyperlipidemia, unspecified: Secondary | ICD-10-CM | POA: Diagnosis not present

## 2016-06-28 LAB — HEPARIN LEVEL (UNFRACTIONATED): HEPARIN UNFRACTIONATED: 0.47 [IU]/mL (ref 0.30–0.70)

## 2016-06-28 LAB — I-STAT TROPONIN, ED: Troponin i, poc: 0 ng/mL (ref 0.00–0.08)

## 2016-06-28 LAB — CBC
HEMATOCRIT: 41.3 % (ref 36.0–46.0)
Hemoglobin: 13.3 g/dL (ref 12.0–15.0)
MCH: 29.6 pg (ref 26.0–34.0)
MCHC: 32.2 g/dL (ref 30.0–36.0)
MCV: 92 fL (ref 78.0–100.0)
Platelets: 312 10*3/uL (ref 150–400)
RBC: 4.49 MIL/uL (ref 3.87–5.11)
RDW: 13.4 % (ref 11.5–15.5)
WBC: 8.6 10*3/uL (ref 4.0–10.5)

## 2016-06-28 LAB — BASIC METABOLIC PANEL
ANION GAP: 8 (ref 5–15)
BUN: 15 mg/dL (ref 6–20)
CHLORIDE: 104 mmol/L (ref 101–111)
CO2: 28 mmol/L (ref 22–32)
Calcium: 9.5 mg/dL (ref 8.9–10.3)
Creatinine, Ser: 0.72 mg/dL (ref 0.44–1.00)
Glucose, Bld: 113 mg/dL — ABNORMAL HIGH (ref 65–99)
POTASSIUM: 4.8 mmol/L (ref 3.5–5.1)
Sodium: 140 mmol/L (ref 135–145)

## 2016-06-28 LAB — TROPONIN I

## 2016-06-28 LAB — MAGNESIUM: MAGNESIUM: 2.1 mg/dL (ref 1.7–2.4)

## 2016-06-28 LAB — PROTIME-INR
INR: 0.97
Prothrombin Time: 12.9 seconds (ref 11.4–15.2)

## 2016-06-28 LAB — APTT: aPTT: 25 seconds (ref 24–36)

## 2016-06-28 MED ORDER — NITROGLYCERIN IN D5W 200-5 MCG/ML-% IV SOLN: 0.0000 ug/min | INTRAVENOUS | Status: DC

## 2016-06-28 MED ORDER — ONDANSETRON HCL 4 MG/2ML IJ SOLN: 4.0000 mg | Freq: Four times a day (QID) | INTRAMUSCULAR | Status: DC | PRN

## 2016-06-28 MED ORDER — ASPIRIN EC 81 MG PO TBEC
81.0000 mg | DELAYED_RELEASE_TABLET | Freq: Every day | ORAL | Status: DC
  Administered 2016-06-29 – 2016-06-30 (×2): 81 mg via ORAL
  Filled 2016-06-28 (×2): qty 1

## 2016-06-28 MED ORDER — CLOPIDOGREL BISULFATE 75 MG PO TABS
75.0000 mg | ORAL_TABLET | Freq: Every day | ORAL | Status: DC
  Administered 2016-06-29 – 2016-06-30 (×2): 75 mg via ORAL
  Filled 2016-06-28 (×2): qty 1

## 2016-06-28 MED ORDER — NITROGLYCERIN 0.4 MG SL SUBL: 0.4000 mg | SUBLINGUAL_TABLET | SUBLINGUAL | Status: DC | PRN

## 2016-06-28 MED ORDER — PANTOPRAZOLE SODIUM 40 MG PO TBEC
40.0000 mg | DELAYED_RELEASE_TABLET | Freq: Every day | ORAL | Status: DC
  Administered 2016-06-29 – 2016-06-30 (×2): 40 mg via ORAL
  Filled 2016-06-28 (×2): qty 1

## 2016-06-28 MED ORDER — HEPARIN (PORCINE) IN NACL 100-0.45 UNIT/ML-% IJ SOLN
800.0000 [IU]/h | INTRAMUSCULAR | Status: DC
  Administered 2016-06-28: 600 [IU]/h via INTRAVENOUS
  Filled 2016-06-28: qty 250

## 2016-06-28 MED ORDER — ALBUTEROL SULFATE HFA 108 (90 BASE) MCG/ACT IN AERS: 2.0000 | INHALATION_SPRAY | RESPIRATORY_TRACT | Status: DC | PRN

## 2016-06-28 MED ORDER — HEPARIN BOLUS VIA INFUSION
3000.0000 [IU] | Freq: Once | INTRAVENOUS | Status: AC
  Administered 2016-06-28: 3000 [IU] via INTRAVENOUS
  Filled 2016-06-28: qty 3000

## 2016-06-28 MED ORDER — HEPARIN BOLUS VIA INFUSION: 4000.0000 [IU] | Freq: Once | INTRAVENOUS | Status: DC

## 2016-06-28 MED ORDER — ASPIRIN 81 MG PO CHEW
324.0000 mg | CHEWABLE_TABLET | ORAL | Status: AC
  Administered 2016-06-28: 324 mg via ORAL
  Filled 2016-06-28: qty 4

## 2016-06-28 MED ORDER — ASPIRIN 300 MG RE SUPP: 300.0000 mg | RECTAL | Status: AC

## 2016-06-28 MED ORDER — ALBUTEROL SULFATE (2.5 MG/3ML) 0.083% IN NEBU: 2.5000 mg | INHALATION_SOLUTION | RESPIRATORY_TRACT | Status: DC | PRN

## 2016-06-28 NOTE — ED Triage Notes (Addendum)
Pt presents to the ed with complaints of having "contractions" in her chest that have been coming and going all morning. She went to urgent care and they sent her here for further evaluation. Denies any other symptoms. Pt has a history of having a heart attack that felt like acid reflux and reports feeling like she has a lot of "gas in her" right now.

## 2016-06-28 NOTE — Progress Notes (Addendum)
[facility] for heparin  Indication: chest pain/ACS  Allergies  Allergen Reactions  . Azithromycin Shortness Of Breath  . Ceclor [Cefaclor] Shortness Of Breath  . Lipitor [Atorvastatin] Other (See Comments)    Severe muscle aches  . Morphine And Related Other (See Comments)    Terrible headache  . Penicillins Other (See Comments)    Unknown from childhood  . Septra [Sulfamethoxazole-Trimethoprim] Shortness Of Breath and Rash  . Simvastatin Other (See Comments)    Severe muscle aches  . Vicodin [Hydrocodone-Acetaminophen] Nausea And Vomiting  . Zetia [Ezetimibe] Other (See Comments)    Severe stomach pain  . Doxycycline Swelling and Other (See Comments)    Redness on the face  . Adhesive [Tape] Other (See Comments)    Redness and swelling - use paper tape    Patient Measurements: Height: '5\' 3"'$  (160 cm) Weight: 135 lb (61.2 kg) IBW/kg (Calculated) : 52.4 Heparin Dosing Weight: 61 kg  Vital Signs: Temp: 98 F (36.7 C) (04/26 1317) Temp Source: Oral (04/26 1317) BP: 125/63 (04/26 1545) Pulse Rate: 87 (04/26 1545)  Labs:  Recent Labs  06/28/16 1323  HGB 13.3  HCT 41.3  PLT 312  CREATININE 0.72    Estimated Creatinine Clearance: 59.5 mL/min (by C-G formula based on SCr of 0.72 mg/dL).   Medical History: Past Medical History:  Diagnosis Date  . Aortic valve stenosis, moderate   . Arthritis    "across my hips; buttocks; comes w/the weather" (02/27/2013)  . CAD (coronary artery disease), native coronary artery   . Cataract    "just the beginnings on the right" (02/27/2013)  . Cellulitis 06/20/2012   "dog bite; right forearm" (06/24/2012)  . Chronic bronchitis ([place])    "used to have it alot; haven't had it in a long time" (02/27/2013)  . COPD (chronic obstructive pulmonary disease) ([place])    "little bit" (02/27/2013)  . Exertional shortness of breath    "sometimes" (02/27/2013)  . GERD (gastroesophageal reflux disease)   .  Heart murmur   . Hypercholesterolemia   . Mass of lung    "small tumor RUL; they are watching it" (02/27/2013)  . Myocardial infarction [facility])    "think dr said I've had 2 silent one" (02/27/2013)  . Nonspecific abnormal unspecified cardiovascular function study   . Other and unspecified angina pectoris   . Peripheral vascular disease, unspecified ([place])   . Pneumonia 1990's   "once" (02/27/2013)  . Seasonal allergies   . Tachycardia, unspecified   . Tobacco abuse     Assessment: 66 yo female with hx of CAD and complex stenting to start heparin for ACS workup. No known a/c PTA. CBC stable.   Goal of Therapy:  Heparin level 0.3-0.7 units/ml Monitor platelets by anticoagulation protocol: Yes   Plan:  1. Heparin 3000 unit bolus 2. Heparin infusion at 800 units/hr 3. Heparin level in 6 hours 4. F/u on plans for cardiac cath   Vincenza Hews, PharmD, BCPS 06/28/2016, 4:16 PM

## 2016-06-28 NOTE — Progress Notes (Signed)
Cardiology Office Note    Date:  06/28/2016   ID:  Bular, Hickok Apr 21, 1950, MRN [id number]  PCP:  Ileana Roup, MD  Cardiologist: Dr. Irish Lack  Chief Complaint  Patient presents with  . Follow-up    Contractions of the heart/chest pain    History of Present Illness:  Teresa [name] is a 66 y.o. female who has CAD had a complex left main , circumflex , LAD stenting after a dissection during PCI in 10/2012. She had PCI of the RCA complicated by dissection and required 3 stents. Mild AS, HLD intolerant to statins and now on PCSK9 inhibitor Repatha. She had successful lung surgery to remove a malignancy in 02/2013.She has occasional spasms in her chest- where her lobectomy was, near the incision. They last a second at the most and resolve spontaneously. No prolonged pain. No pain with exercise. Last saw Dr. Irish Lack 08/18/15 and chest pain felt to be atypical.  Patient had onto my schedule today for squeezing or contracting in her chest that lasts for seconds and then goes away. She hasn't used nitroglycerin because of episodes go away before she could use a nitroglycerin. Occurs with and without activity. 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 She says the squeezing would come and go throughout the day. Walking in here today from the parking lot she has more severe squeezing and tightness with a low shortness of breath. It eased spontaneously. While sitting in the office she's had several more episodes. She's got some new T-wave inversion. She says it's exactly the same sensation that she had prior to her stents.    Past Medical History:  Diagnosis Date  . Aortic valve stenosis, moderate   . Arthritis    "across my hips; buttocks; comes w/the weather" (02/27/2013)  . CAD (coronary artery disease), native coronary artery   . Cataract    "just the beginnings on the right" (02/27/2013)  . Cellulitis 06/20/2012   "dog bite; right forearm"  (06/24/2012)  . Chronic bronchitis ([place])    "used to have it alot; haven't had it in a long time" (02/27/2013)  . COPD (chronic obstructive pulmonary disease) ([place])    "little bit" (02/27/2013)  . Exertional shortness of breath    "sometimes" (02/27/2013)  . GERD (gastroesophageal reflux disease)   . Heart murmur   . Hypercholesterolemia   . Mass of lung    "small tumor RUL; they are watching it" (02/27/2013)  . Myocardial infarction [facility])    "think dr said I've had 2 silent one" (02/27/2013)  . Nonspecific abnormal unspecified cardiovascular function study   . Other and unspecified angina pectoris   . Peripheral vascular disease, unspecified ([place])   . Pneumonia 1990's   "once" (02/27/2013)  . Seasonal allergies   . Tachycardia, unspecified   . Tobacco abuse     Past Surgical History:  Procedure Laterality Date  . CORONARY ANGIOPLASTY WITH STENT PLACEMENT  10/2012; 12/08/2012   "7 + 3" (02/27/2013)  . ESOPHAGOGASTRODUODENOSCOPY (EGD) WITH PROPOFOL N/A 01/11/2015   Procedure: ESOPHAGOGASTRODUODENOSCOPY (EGD) WITH PROPOFOL;  Surgeon: Garlan Fair, MD;  Location: [facility];  Service: Endoscopy;  Laterality: N/A;  . FLEXIBLE BRONCHOSCOPY N/A 03/02/2013   Procedure: FLEXIBLE BRONCHOSCOPY;  Surgeon: Gaye Pollack, MD;  Location: [place];  Service: Thoracic;  Laterality: N/A;  . FLEXIBLE SIGMOIDOSCOPY N/A 01/11/2015   Procedure: FLEXIBLE SIGMOIDOSCOPY;  Surgeon: Garlan Fair, MD;  Location: [facility];  Service: Endoscopy;  Laterality:  N/A;  unable to complete colon-prep issues  . GANGLION CYST EXCISION Left 1975   "wrist"  . LEFT HEART CATHETERIZATION WITH CORONARY ANGIOGRAM N/A 12/08/2012   Procedure: LEFT HEART CATHETERIZATION WITH CORONARY ANGIOGRAM;  Surgeon: Jettie Booze, MD;  Location[facility] CATH LAB;  Service: Cardiovascular;  Laterality: N/A;  . MOUTH SURGERY  2010?   "for bone loss" (06/24/2012)  . PERCUTANEOUS CORONARY STENT INTERVENTION (PCI-S) N/A 10/09/2012    Procedure: PERCUTANEOUS CORONARY STENT INTERVENTION (PCI-S);  Surgeon: Jettie Booze, MD;  Location: [facility] CATH LAB;  Service: Cardiovascular;  Laterality: N/A;  . THORACOTOMY/LOBECTOMY Right 03/02/2013   Procedure: Right Video Assisted Thoracoscopy/Thoracotomy with upper Lobectomy;  Surgeon: Gaye Pollack, MD;  Location: [facility] OR;  Service: Thoracic;  Laterality: Right;  Right Lung Upper  Lobectomy   . TONSILLECTOMY  1960  . VIDEO BRONCHOSCOPY  09/12/2011   Procedure: VIDEO BRONCHOSCOPY WITH FLUORO;  Surgeon: Tanda Rockers, MD;  Location: [facility];  Service: Cardiopulmonary;  Laterality: Bilateral;    Current Medications: Outpatient Medications Prior to Visit  Medication Sig Dispense Refill  . albuterol (PROVENTIL HFA) 108 (90 BASE) MCG/ACT inhaler Inhale 2 puffs into the lungs 2 (two) times daily as needed for wheezing or shortness of breath.     . cholecalciferol (VITAMIN D) 1000 UNITS tablet Take 1 tablet (1,000 Units total) by mouth at bedtime. 90 tablet 0  . clopidogrel (PLAVIX) 75 MG tablet Take 1 tablet (75 mg total) by mouth daily. 90 tablet 0  . diphenhydrAMINE (BENADRYL) 25 MG tablet Take 25 mg by mouth every 6 (six) hours as needed.    . Evolocumab (REPATHA SURECLICK) 161 MG/ML SOAJ Inject 140 mg into the skin every 14 (fourteen) days. 2 pen 11  . fluticasone (FLONASE) 50 MCG/ACT nasal spray INHALE 2 SPRAYS IN EACH NOSTRIL AS NEEDED DAILY for allergies  4  . lansoprazole (PREVACID) 30 MG capsule Take 1 capsule (30 mg total) by mouth daily.    [name] loratadine-pseudoephedrine (CLARITIN-D 24-HOUR) 10-240 MG 24 hr tablet Take 1 tablet by mouth daily as needed for allergies.    [name] NASAL SALINE NA Place 1 spray into the nose daily as needed (congestion).     . nitroGLYCERIN (NITROSTAT) 0.4 MG SL tablet Place 1 tablet (0.4 mg total) under the tongue every 5 (five) minutes as needed for chest pain. 25 tablet 4  . OVER THE COUNTER MEDICATION Take 1 tablet by mouth daily with lunch. [facility]  Menopause support    . ranitidine (ZANTAC) 150 MG tablet Take 150 mg by mouth daily as needed for heartburn.     . guaifenesin (ROBITUSSIN) 100 MG/5ML syrup Take 200 mg by mouth 3 (three) times daily as needed for cough.    [name] PENNSAID 2 % SOLN      No facility-administered medications prior to visit.      Allergies:   Azithromycin; Ceclor [cefaclor]; Lipitor [atorvastatin]; Morphine and related; Penicillins; Septra [sulfamethoxazole-trimethoprim]; Simvastatin; Vicodin [hydrocodone-acetaminophen]; Zetia [ezetimibe]; Doxycycline; and Adhesive [tape]   Social History   Social History  . Marital status: Married    Spouse name: N/A  . Number of children: 1  . 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 Smoking status: Former Smoker    Packs/day: 0.50    Years: 40.00    Types: Cigarettes    Quit date: 10/08/2012  . Smokeless tobacco: Never Used  . Alcohol use No  .  Drug use: No  . Sexual activity: Yes   Other Topics Concern  . None   Social History Narrative  . None     Family History:  The patient's   family history includes Alcohol abuse in her father; Lupus in her mother; Migraines in her sister; Other in her sister; Pulmonary fibrosis in her brother.   ROS:   Please see the history of present illness.    Review of Systems  Constitution: Negative.  HENT: Negative.   Eyes: Negative.   Cardiovascular: Positive for chest pain, dyspnea on exertion and palpitations.  Respiratory: Negative.   Hematologic/Lymphatic: Negative.   Musculoskeletal: Negative.  Negative for joint pain.  Gastrointestinal: Negative.   Genitourinary: Negative.   Neurological: Negative.    All other systems reviewed and are negative.   PHYSICAL EXAM:   VS:  BP (!) 158/90   Pulse 89   Ht '4\' 11"'$  (1.499 m)   Wt 134 lb 9.6 oz (61.1 kg)   LMP 03/25/2011   BMI 27.19 kg/m   Physical Exam  GEN: Well nourished, well developed, in no acute  distress  HEENT: normal  Neck: no JVD, carotid bruits, or masses Cardiac:RRR; 3/6 systolic murmur at the left sternal border, no rubs, or gallops  Respiratory:  clear to auscultation bilaterally, normal work of breathing GI: soft, nontender, nondistended, + BS Ext: without cyanosis, clubbing, or edema, Good distal pulses bilaterally MS: no deformity or atrophy  Skin: warm and dry, no rash Neuro:  Alert and Oriented x 3, Strength and sensation are intact Psych: euthymic mood, full affect  Wt Readings from Last 3 Encounters:  06/28/16 134 lb 9.6 oz (61.1 kg)  01/13/16 133 lb (60.3 kg)  01/10/16 134 lb 1.6 oz (60.8 kg)      Studies/Labs Reviewed:   EKG:  EKG is  ordered today.  The ekg ordered today demonstrates Normal sinus rhythm with PVC and more prominent T-wave inversion V3 through V5 and slight ST depression inferiorly  Recent Labs: 01/03/2016: ALT 15; BUN 15.6; Creatinine 0.8; HGB 14.6; Platelets 282; Potassium 5.5; Sodium 140   Lipid Panel    Component Value Date/Time   CHOL 152 08/18/2015 0840   TRIG 105 08/18/2015 0840   HDL 65 08/18/2015 0840   CHOLHDL 2.3 08/18/2015 0840   VLDL 21 08/18/2015 0840   LDLCALC 66 08/18/2015 0840    Additional studies/ records that were reviewed today include:  Cardiac catheterization 2014IMPRESSIONS:   1. Widely patent stents in the left main coronary artery. 2. Widely patent stents in the left anterior descending artery. 3. Widely patent stents in the left circumflex artery and its branches. 4. 80% mid vessel lesion in the right coronary artery.  Successful overlapping drug-eluting stent placement in the mid to distal right coronary artery. 5. Mildly decreased left ventricular systolic function with apical hypokinesis.  LVEDP 21 mmHg.  Ejection fraction 45 %.  Mild aortic valve gradient.   RECOMMENDATION:  Continue dual antiplatelet therapy indefinitely. We'll speak to Dr. Cyndia Bent about lung cancer resection in the future. She needs  to continue to abstain from smoking. Continue aggressive secondary prevention..     ASSESSMENT:    1. Unstable angina (HCC)   2. Chest pain, unspecified type   3. Atherosclerosis of native coronary artery of native heart without angina pectoris   4. Aortic valve stenosis, etiology of cardiac valve disease unspecified   5. Mixed hyperlipidemia      PLAN:  In order of problems listed above:  Unstable angina patient complaining of squeezing chest pain similar to what she had in 2014 when she had her stents. Off-and-on at rest and with exertion but worse walking in here today. New T-wave inversion on EKG. Dr. Acie Fredrickson examined patient with me and concurs that she should be hospitalized. We will send her to the emergency room and start IV heparin and nitroglycerin. Dr. Irish Lack will see her there. Will most likely need cardiac catheterization later today or tomorrow.  CAD with complex stenting as described above. Successful overlapping being drug-eluting stents in the mid to distal RCA 2014 with widely patent stents in the left main, LAD and circumflex LVEF 45% with mild aortic valve gradient at that time. Now being admitted with unstable angina.  Aortic valve stenosis has significant aortic stenosis murmur on exam. Can't find 2-D echo in the system. We'll order 2-D echo.  Mixed hyperlipidemia on PCSK9 inhibitor Repatha  Medication Adjustments/Labs and Tests Ordered: Current medicines are reviewed at length with the patient today.  Concerns regarding medicines are outlined above.  Medication changes, Labs and Tests ordered today are listed in the Patient Instructions below. Patient Instructions  YOU HAVE BEEN  REFERRED TO THE EMERGENCY DEPARTMENT      Signed, Ermalinda Barrios, PA-C  06/28/2016 12:48 PM    [facility] [address], [place], [place]  [address] Phone: [phone]; Fax: [phone]

## 2016-06-28 NOTE — Telephone Encounter (Signed)
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

## 2016-06-28 NOTE — Telephone Encounter (Signed)
Patient called and she states that she has had a couple of episodes of her heart "contracting" or squeezing that lasts for one second and goes away. She states that this started yesterday evening. She states that it happens at rest and with activity. She denies any SOB, lightheadedness, dizziness, or any other symptoms. She has no vitals to offer. She states that she has not taken any NTG since the episodes only last for a second and resolve on their own. Patient states that she is taking her plavix. Patient denies having any symptoms at this time. Patient states that they already scheduled her an appointment with Estella Husk, PA at 12:15 AM today.

## 2016-06-28 NOTE — Plan of Care (Signed)
Problem: Cardiac: Goal: Ability to achieve and maintain adequate cardiovascular perfusion will improve Outcome: Progressing VSS. Initial Troponin 0.00

## 2016-06-28 NOTE — Patient Instructions (Signed)
YOU HAVE BEEN  REFERRED TO THE EMERGENCY DEPARTMENT

## 2016-06-28 NOTE — ED Provider Notes (Signed)
[facility] DEPT Provider Note   CSN: [id number] Arrival date & time: 06/28/16  1313     History   Chief Complaint Chief Complaint  Patient presents with  . Chest Pain    HPI Teresa [name] is a 65 y.o. female.  The history is provided by the patient and medical records. No language interpreter was used.  Chest Pain     Teresa [name] is a 66 y.o. female  with a PMH of CAD s/p stenting after dissection during PCI in 20014 who presents to the Emergency Department from cardiology office for concerns of chest pain.  Patient states that she has sharp squeezing left-sided chest pain which she describes as "contractions". R did having these pains yesterday and had approximately 20 episodes. She states that were not worse with exertion, however when she went to the cardiologist today, walking into the office did make her pain much more severe. No associated shortness of breath, diaphoresis, jaw pain, shoulder pain or extremities pain. No nausea or vomiting. Patient does state that her pain today feels extremely similar to when she required catheterization in the past. When seen by cardiologist in clinic, new T-wave inversion on EKG was noted and she was told to go to the emergency department for likely admission. These were given prior to arrival for her symptoms. She does take Plavix daily which she took this morning.  Past Medical History:  Diagnosis Date  . Aortic valve stenosis, moderate   . Arthritis    "across my hips; buttocks; comes w/the weather" (02/27/2013)  . CAD (coronary artery disease), native coronary artery   . Cataract    "just the beginnings on the right" (02/27/2013)  . Cellulitis 06/20/2012   "dog bite; right forearm" (06/24/2012)  . Chronic bronchitis ([place])    "used to have it alot; haven't had it in a long time" (02/27/2013)  . COPD (chronic obstructive pulmonary disease) ([place])    "little bit" (02/27/2013)  . Exertional shortness of breath    "sometimes"  (02/27/2013)  . GERD (gastroesophageal reflux disease)   . Heart murmur   . Hypercholesterolemia   . Mass of lung    "small tumor RUL; they are watching it" (02/27/2013)  . Myocardial infarction [facility])    "think dr said I've had 2 silent one" (02/27/2013)  . Nonspecific abnormal unspecified cardiovascular function study   . Other and unspecified angina pectoris   . Peripheral vascular disease, unspecified ([place])   . Pneumonia 1990's   "once" (02/27/2013)  . Seasonal allergies   . Tachycardia, unspecified   . Tobacco abuse     Patient Active Problem List   Diagnosis Date Noted  . Obesity 07/28/2013  . Lung cancer, upper lobe ([place]) 03/31/2013  . Tachycardia, unspecified   . Lung mass 02/27/2013  . Mixed hyperlipidemia 12/26/2012  . Coronary atherosclerosis of native coronary artery 12/26/2012  . Unstable angina ([place])   . CAD (coronary artery disease), native coronary artery   . Peripheral vascular disease, unspecified ([place])   . Nonspecific abnormal unspecified cardiovascular function study   . Cough 08/19/2012  . Solitary pulmonary nodule 06/29/2012  . Pneumonia 06/26/2012  . Dog bite of forearm 06/23/2012  . Cellulitis 06/23/2012  . COPD GOLD II 11/07/2011  . Smoker 11/07/2011  . Multiple pulmonary nodules 08/25/2011  . Aortic stenosis 08/25/2011    Past Surgical History:  Procedure Laterality Date  . CORONARY ANGIOPLASTY WITH STENT PLACEMENT  10/2012; 12/08/2012   "7 + 3" (02/27/2013)  .  ESOPHAGOGASTRODUODENOSCOPY (EGD) WITH PROPOFOL N/A 01/11/2015   Procedure: ESOPHAGOGASTRODUODENOSCOPY (EGD) WITH PROPOFOL;  Surgeon: Garlan Fair, MD;  Location: [facility];  Service: Endoscopy;  Laterality: N/A;  . FLEXIBLE BRONCHOSCOPY N/A 03/02/2013   Procedure: FLEXIBLE BRONCHOSCOPY;  Surgeon: Gaye Pollack, MD;  Location: [facility];  Service: Thoracic;  Laterality: N/A;  . FLEXIBLE SIGMOIDOSCOPY N/A 01/11/2015   Procedure: FLEXIBLE SIGMOIDOSCOPY;  Surgeon: Garlan Fair, MD;   Location: [facility];  Service: Endoscopy;  Laterality: N/A;  unable to complete colon-prep issues  . GANGLION CYST EXCISION Left 1975   "wrist"  . LEFT HEART CATHETERIZATION WITH CORONARY ANGIOGRAM N/A 12/08/2012   Procedure: LEFT HEART CATHETERIZATION WITH CORONARY ANGIOGRAM;  Surgeon: Jettie Booze, MD;  Location: [facility] CATH LAB;  Service: Cardiovascular;  Laterality: N/A;  . MOUTH SURGERY  2010?   "for bone loss" (06/24/2012)  . PERCUTANEOUS CORONARY STENT INTERVENTION (PCI-S) N/A 10/09/2012   Procedure: PERCUTANEOUS CORONARY STENT INTERVENTION (PCI-S);  Surgeon: Jettie Booze, MD;  Location: [facility] CATH LAB;  Service: Cardiovascular;  Laterality: N/A;  . THORACOTOMY/LOBECTOMY Right 03/02/2013   Procedure: Right Video Assisted Thoracoscopy/Thoracotomy with upper Lobectomy;  Surgeon: Gaye Pollack, MD;  Location: [facility] OR;  Service: Thoracic;  Laterality: Right;  Right Lung Upper  Lobectomy   . TONSILLECTOMY  1960  . VIDEO BRONCHOSCOPY  09/12/2011   Procedure: VIDEO BRONCHOSCOPY WITH FLUORO;  Surgeon: Tanda Rockers, MD;  Location: [facility];  Service: Cardiopulmonary;  Laterality: Bilateral;    OB History    No data available       Home Medications    Prior to Admission medications   Medication Sig Start Date End Date Taking? Authorizing Provider  albuterol (PROVENTIL HFA) 108 (90 BASE) MCG/ACT inhaler Inhale 2 puffs into the lungs 2 (two) times daily as needed for wheezing or shortness of breath.    Yes Historical Provider, MD  cholecalciferol (VITAMIN D) 1000 UNITS tablet Take 1 tablet (1,000 Units total) by mouth at bedtime. 11/26/13  Yes Jettie Booze, MD  clopidogrel (PLAVIX) 75 MG tablet Take 1 tablet (75 mg total) by mouth daily. 06/20/16  Yes Belva Crome, MD  fluticasone (FLONASE) 50 MCG/ACT nasal spray INHALE 2 SPRAYS IN EACH NOSTRIL AS NEEDED DAILY for allergies 09/23/14  Yes Historical Provider, MD  lansoprazole (PREVACID) 30 MG capsule Take 1 capsule (30 mg  total) by mouth daily. 12/22/12  Yes Jettie Booze, MD  loratadine-pseudoephedrine (CLARITIN-D 24-HOUR) 10-240 MG 24 hr tablet Take 1 tablet by mouth daily as needed for allergies.   Yes Historical Provider, MD  NASAL SALINE NA Place 1 spray into the nose daily as needed (congestion).    Yes Historical Provider, MD  nitroGLYCERIN (NITROSTAT) 0.4 MG SL tablet Place 1 tablet (0.4 mg total) under the tongue every 5 (five) minutes as needed for chest pain. 01/29/14  Yes Jettie Booze, MD  OVER THE COUNTER MEDICATION Take 1 tablet by mouth daily with lunch. [facility] Menopause support   Yes Historical Provider, MD  ranitidine (ZANTAC) 150 MG tablet Take 150 mg by mouth daily as needed for heartburn.    Yes Historical Provider, MD  simethicone (MYLICON) 80 MG chewable tablet Chew 160 mg by mouth every 6 (six) hours as needed for flatulence.   Yes Historical Provider, MD  Evolocumab (REPATHA SURECLICK) 737 MG/ML SOAJ Inject 140 mg into the skin every 14 (fourteen) days. 01/30/16   Jettie Booze, MD    Family History Family History  Problem  Relation Age of Onset  . Alcohol abuse Father   . Lupus Mother   . Other Sister     Degenerative disc disease  . Migraines Sister   . Pulmonary fibrosis Brother   . Heart attack Neg Hx     Social History Social History  Substance Use Topics  . Smoking status: Former Smoker    Packs/day: 0.50    Years: 40.00    Types: Cigarettes    Quit date: 10/08/2012  . Smokeless tobacco: Never Used  . Alcohol use No     Allergies   Azithromycin; Ceclor [cefaclor]; Lipitor [atorvastatin]; Morphine and related; Penicillins; Septra [sulfamethoxazole-trimethoprim]; Simvastatin; Vicodin [hydrocodone-acetaminophen]; Zetia [ezetimibe]; Doxycycline; and Adhesive [tape]   Review of Systems Review of Systems  Cardiovascular: Positive for chest pain.  All other systems reviewed and are negative.    Physical Exam Updated Vital Signs BP 139/61   Pulse 84    Temp 98 F (36.7 C) (Oral)   Resp 17   Ht '5\' 3"'$  (1.6 m)   Wt 61.2 kg   LMP 03/25/2011   SpO2 98%   BMI 23.91 kg/m   Physical Exam  Constitutional: She is oriented to person, place, and time. She appears well-developed and well-nourished. No distress.  HENT:  Head: Normocephalic and atraumatic.  Cardiovascular: Normal rate, regular rhythm and intact distal pulses.   Murmur heard. Pulmonary/Chest: Effort normal and breath sounds normal.  Abdominal: Soft. She exhibits no distension. There is no tenderness.  Musculoskeletal: She exhibits no edema.  Neurological: She is alert and oriented to person, place, and time.  Skin: Skin is warm and dry.  Nursing note and vitals reviewed.    ED Treatments / Results  Labs (all labs ordered are listed, but only abnormal results are displayed) Labs Reviewed  BASIC METABOLIC PANEL - Abnormal; Notable for the following:       Result Value   Glucose, Bld 113 (*)    All other components within normal limits  CBC  APTT  MAGNESIUM  PROTIME-INR  HEPARIN LEVEL (UNFRACTIONATED)  I-STAT TROPOININ, ED    EKG  EKG Interpretation  Date/Time:  Thursday June 28 2016 13:17:58 EDT Ventricular Rate:  87 PR Interval:  156 QRS Duration: 78 QT Interval:  362 QTC Calculation: 435 R Axis:   61 Text Interpretation:  Sinus rhythm with occasional Premature ventricular complexes Septal infarct , age undetermined Abnormal ECG occasional PVC new otherwise no significant change Confirmed by LITTLE MD, RACHEL [phone]) on 06/28/2016 3:07:21 PM       Radiology Dg Chest 2 View  Result Date: 06/28/2016 CLINICAL DATA:  Left chest pain for 1 day. History of right lower surgery. EXAM: CHEST  2 VIEW COMPARISON:  01/03/2016 and prior CTs. 06/24/2013 and prior radiographs FINDINGS: Cardiomediastinal silhouette is unchanged. Pulmonary stents are again identified. Right thoracic surgical changes are again noted with right hemithorax volume loss. There is no evidence  of focal airspace disease, pulmonary edema, suspicious pulmonary nodule/mass, pleural effusion, or pneumothorax. No acute bony abnormalities are identified. IMPRESSION: No evidence of acute cardiopulmonary disease. Electronically Signed   By: Margarette Canada M.D.   On: 06/28/2016 13:52    Procedures Procedures (including critical care time)  Medications Ordered in ED Medications  ondansetron (ZOFRAN) injection 4 mg (not administered)  aspirin EC tablet 81 mg (not administered)  clopidogrel (PLAVIX) tablet 75 mg (not administered)  nitroGLYCERIN (NITROSTAT) SL tablet 0.4 mg (not administered)  nitroGLYCERIN 50 mg in dextrose 5 % 250 mL (0.2 mg/mL)  infusion (0 mcg/min Intravenous Hold 06/28/16 1552)  heparin ADULT infusion 100 units/mL (25000 units/273m sodium chloride 0.45%) (600 Units/hr Intravenous New Bag/Given 06/28/16 1559)  aspirin chewable tablet 324 mg (324 mg Oral Given 06/28/16 1555)    Or  aspirin suppository 300 mg ( Rectal See Alternative 06/28/16 1555)  heparin bolus via infusion 3,000 Units (3,000 Units Intravenous Bolus from Bag 06/28/16 1600)     Initial Impression / Assessment and Plan / ED Course  I have reviewed the triage vital signs and the nursing notes.  Pertinent labs & imaging results that were available during my care of the patient were reviewed by me and considered in my medical decision making (see chart for details).      Teresa MMARLAINE AREYis a 66y.o. female who presents to ED for chest pain. Per cardiology notes: patient presented today with "unstable angina. patient complaining of squeezing chest pain similar to what she had in 2014 when she had her stents. Off-and-on at rest and with exertion but worse walking in here today. New T-wave inversion on EKG. Dr. NAcie Fredricksonexamined patient with me and concurs that she should be hospitalized. We will send her to the emergency room and start IV heparin and nitroglycerin. Dr. VIrish Lackwill see her there. Will most likely need  cardiac catheterization later today or tomorrow."   Troponin negative. CXR negative. EKG with no significant change from previous. Cardiology consulted who will admit.    Final Clinical Impressions(s) / ED Diagnoses   Final diagnoses:  Unstable angina (HCC)  Chest pain, unspecified type    New Prescriptions New Prescriptions   No medications on file     JSt Vincent Clay Hospital IncWard, PA-C 06/28/16 1[place] MD 06/28/16 [phone]

## 2016-06-29 ENCOUNTER — Encounter (HOSPITAL_COMMUNITY)
Admission: EM | Disposition: A | Discharge status: Home / Self Care | Payer: Self-pay | Source: Home / Self Care | Attending: Emergency Medicine

## 2016-06-29 DIAGNOSIS — I350 Nonrheumatic aortic (valve) stenosis: Secondary | ICD-10-CM

## 2016-06-29 DIAGNOSIS — I200 Unstable angina: Secondary | ICD-10-CM | POA: Diagnosis not present

## 2016-06-29 DIAGNOSIS — I25110 Atherosclerotic heart disease of native coronary artery with unstable angina pectoris: Secondary | ICD-10-CM | POA: Diagnosis not present

## 2016-06-29 DIAGNOSIS — I252 Old myocardial infarction: Secondary | ICD-10-CM | POA: Diagnosis not present

## 2016-06-29 DIAGNOSIS — J449 Chronic obstructive pulmonary disease, unspecified: Secondary | ICD-10-CM | POA: Diagnosis not present

## 2016-06-29 HISTORY — PX: LEFT HEART CATH AND CORONARY ANGIOGRAPHY: CATH118249

## 2016-06-29 LAB — TROPONIN I: Troponin I: <0.03 ng/mL (ref <0.03)

## 2016-06-29 LAB — HEPARIN LEVEL (UNFRACTIONATED): HEPARIN UNFRACTIONATED: 0.59 [IU]/mL (ref 0.30–0.70)

## 2016-06-29 SURGERY — LEFT HEART CATH AND CORONARY ANGIOGRAPHY
Anesthesia: LOCAL

## 2016-06-29 MED ORDER — ASPIRIN 81 MG PO CHEW: 81.0000 mg | CHEWABLE_TABLET | ORAL | Status: AC

## 2016-06-29 MED ORDER — SODIUM CHLORIDE 0.9 % IV SOLN: 250.0000 mL | INTRAVENOUS | Status: DC | PRN

## 2016-06-29 MED ORDER — SIMETHICONE 80 MG PO CHEW: 160.0000 mg | CHEWABLE_TABLET | Freq: Four times a day (QID) | ORAL | Status: DC | PRN

## 2016-06-29 MED ORDER — PANTOPRAZOLE SODIUM 40 MG PO TBEC: 40.0000 mg | DELAYED_RELEASE_TABLET | Freq: Every day | ORAL | Status: DC

## 2016-06-29 MED ORDER — HEPARIN (PORCINE) IN NACL 2-0.9 UNIT/ML-% IJ SOLN
INTRAMUSCULAR | Status: DC | PRN
  Administered 2016-06-29: 17:00:00 via INTRA_ARTERIAL

## 2016-06-29 MED ORDER — SODIUM CHLORIDE 0.9% FLUSH: 3.0000 mL | Freq: Two times a day (BID) | INTRAVENOUS | Status: DC

## 2016-06-29 MED ORDER — SODIUM CHLORIDE 0.9 % IV SOLN: INTRAVENOUS | Status: AC

## 2016-06-29 MED ORDER — MIDAZOLAM HCL 2 MG/2ML IJ SOLN
INTRAMUSCULAR | Status: AC
  Filled 2016-06-29: qty 2

## 2016-06-29 MED ORDER — LIDOCAINE HCL (PF) 1 % IJ SOLN
INTRAMUSCULAR | Status: AC
  Filled 2016-06-29: qty 30

## 2016-06-29 MED ORDER — SODIUM CHLORIDE 0.9 % WEIGHT BASED INFUSION: 3.0000 mL/kg/h | INTRAVENOUS | Status: DC

## 2016-06-29 MED ORDER — SODIUM CHLORIDE 0.9 % WEIGHT BASED INFUSION: 1.0000 mL/kg/h | INTRAVENOUS | Status: DC

## 2016-06-29 MED ORDER — NITROGLYCERIN 0.4 MG SL SUBL: 0.4000 mg | SUBLINGUAL_TABLET | SUBLINGUAL | Status: DC | PRN

## 2016-06-29 MED ORDER — ACETAMINOPHEN 325 MG PO TABS: 650.0000 mg | ORAL_TABLET | ORAL | Status: DC | PRN

## 2016-06-29 MED ORDER — SODIUM CHLORIDE 0.9% FLUSH: 3.0000 mL | INTRAVENOUS | Status: DC | PRN

## 2016-06-29 MED ORDER — SODIUM CHLORIDE 0.9% FLUSH
3.0000 mL | Freq: Two times a day (BID) | INTRAVENOUS | Status: DC
  Administered 2016-06-29: 3 mL via INTRAVENOUS

## 2016-06-29 MED ORDER — IOPAMIDOL (ISOVUE-370) INJECTION 76%
INTRAVENOUS | Status: DC | PRN
  Administered 2016-06-29: 40 mL via INTRA_ARTERIAL

## 2016-06-29 MED ORDER — CLOPIDOGREL BISULFATE 75 MG PO TABS: 75.0000 mg | ORAL_TABLET | Freq: Every day | ORAL | Status: DC

## 2016-06-29 MED ORDER — HEPARIN SODIUM (PORCINE) 1000 UNIT/ML IJ SOLN
INTRAMUSCULAR | Status: AC
  Filled 2016-06-29: qty 1

## 2016-06-29 MED ORDER — EVOLOCUMAB 140 MG/ML ~~LOC~~ SOAJ: 140.0000 mg | SUBCUTANEOUS | Status: DC

## 2016-06-29 MED ORDER — HEPARIN (PORCINE) IN NACL 2-0.9 UNIT/ML-% IJ SOLN
INTRAMUSCULAR | Status: DC | PRN
  Administered 2016-06-29: 1000 mL

## 2016-06-29 MED ORDER — ALBUTEROL SULFATE HFA 108 (90 BASE) MCG/ACT IN AERS: 2.0000 | INHALATION_SPRAY | Freq: Two times a day (BID) | RESPIRATORY_TRACT | Status: DC | PRN

## 2016-06-29 MED ORDER — FENTANYL CITRATE (PF) 100 MCG/2ML IJ SOLN
INTRAMUSCULAR | Status: DC | PRN
  Administered 2016-06-29 (×2): 25 ug via INTRAVENOUS

## 2016-06-29 MED ORDER — LIDOCAINE HCL (PF) 1 % IJ SOLN
INTRAMUSCULAR | Status: DC | PRN
  Administered 2016-06-29: 2 mL

## 2016-06-29 MED ORDER — HEPARIN (PORCINE) IN NACL 2-0.9 UNIT/ML-% IJ SOLN
INTRAMUSCULAR | Status: AC
  Filled 2016-06-29: qty 1000

## 2016-06-29 MED ORDER — HEPARIN SODIUM (PORCINE) 1000 UNIT/ML IJ SOLN
INTRAMUSCULAR | Status: DC | PRN
  Administered 2016-06-29: 3000 [IU] via INTRAVENOUS

## 2016-06-29 MED ORDER — IOPAMIDOL (ISOVUE-370) INJECTION 76%
INTRAVENOUS | Status: AC
  Filled 2016-06-29: qty 100

## 2016-06-29 MED ORDER — FAMOTIDINE 20 MG PO TABS
10.0000 mg | ORAL_TABLET | Freq: Every day | ORAL | Status: DC
  Administered 2016-06-29: 10 mg via ORAL
  Filled 2016-06-29: qty 1

## 2016-06-29 MED ORDER — MIDAZOLAM HCL 2 MG/2ML IJ SOLN
INTRAMUSCULAR | Status: DC | PRN
  Administered 2016-06-29: 1 mg via INTRAVENOUS
  Administered 2016-06-29: 2 mg via INTRAVENOUS

## 2016-06-29 MED ORDER — VERAPAMIL HCL 2.5 MG/ML IV SOLN
INTRAVENOUS | Status: AC
  Filled 2016-06-29: qty 2

## 2016-06-29 MED ORDER — FENTANYL CITRATE (PF) 100 MCG/2ML IJ SOLN
INTRAMUSCULAR | Status: AC
  Filled 2016-06-29: qty 2

## 2016-06-29 MED ORDER — ONDANSETRON HCL 4 MG/2ML IJ SOLN: 4.0000 mg | Freq: Four times a day (QID) | INTRAMUSCULAR | Status: DC | PRN

## 2016-06-29 SURGICAL SUPPLY — 11 items
CATH INFINITI 5 FR JL3.5 (CATHETERS) ×1 IMPLANT
CATH INFINITI 5FR AL1 (CATHETERS) ×1 IMPLANT
CATH INFINITI JR4 5F (CATHETERS) ×1 IMPLANT
DEVICE RAD COMP TR BAND LRG (VASCULAR PRODUCTS) ×1 IMPLANT
GLIDESHEATH SLEND SS 6F .021 (SHEATH) ×1 IMPLANT
GUIDEWIRE INQWIRE 1.5J.035X260 (WIRE) IMPLANT
INQWIRE 1.5J .035X260CM (WIRE) ×2
KIT HEART LEFT (KITS) ×2 IMPLANT
PACK CARDIAC CATHETERIZATION (CUSTOM PROCEDURE TRAY) ×2 IMPLANT
TRANSDUCER W/STOPCOCK (MISCELLANEOUS) ×2 IMPLANT
TUBING CIL FLEX 10 FLL-RA (TUBING) ×2 IMPLANT

## 2016-06-29 NOTE — Progress Notes (Signed)
ANTICOAGULATION CONSULT NOTE - Follow Up Consult  Pharmacy Consult for Heparin  Indication: chest pain/ACS  Patient Measurements: Height: 4' 11.5" (151.1 cm) Weight: 133 lb 11.2 oz (60.6 kg) IBW/kg (Calculated) : 44.35  Vital Signs: Temp: 98.1 F (36.7 C) (04/27 0025) Temp Source: Oral (04/27 0025) BP: 118/62 (04/27 0025) Pulse Rate: 68 (04/27 0025)  Labs:  Recent Labs  06/28/16 1323 06/28/16 1602 06/28/16 2113 06/28/16 2254  HGB 13.3  --   --   --   HCT 41.3  --   --   --   PLT 312  --   --   --   APTT  --  25  --   --   LABPROT  --  12.9  --   --   INR  --  0.97  --   --   HEPARINUNFRC  --   --   --  0.47  CREATININE 0.72  --   --   --   TROPONINI  --   --  <0.03  --     Estimated Creatinine Clearance: 57.8 mL/min (by C-G formula based on SCr of 0.72 mg/dL).   Assessment: Heparin while undergoing CP work-up, initial heparin level is therapeutic, possible cath today  Goal of Therapy:  Heparin level 0.3-0.7 units/ml Monitor platelets by anticoagulation protocol: Yes   Plan:  -Cont heparin 800 units/hr -AM HL  Narda Bonds 06/29/2016,12:28 AM

## 2016-06-29 NOTE — Progress Notes (Signed)
Pt back in room following right radial heart cath, TR band intact with reported 15cc air, site level 0, denies pain or discomfort, tolerating liquids with no complaint of nausea, family at bedside, call light in reach.  Edward Qualia RN

## 2016-06-29 NOTE — Progress Notes (Signed)
Progress Note  Patient Name: Teresa [name] Date of Encounter: 06/29/2016  Primary Cardiologist: Irish Lack  Subjective   No further chest pain since starting on heparin.   Inpatient Medications    Scheduled Meds: . aspirin EC  81 mg Oral Daily  . clopidogrel  75 mg Oral Q breakfast  . pantoprazole  40 mg Oral Q0600   Continuous Infusions: . heparin 800 Units/hr (06/28/16 1714)  . nitroGLYCERIN Stopped (06/28/16 1552)   PRN Meds: albuterol, nitroGLYCERIN, ondansetron (ZOFRAN) IV   Vital Signs    Vitals:   06/28/16 1951 06/29/16 0025 06/29/16 0504 06/29/16 0749  BP: (!) 108/55 118/62 140/65 117/61  Pulse: 86 68 83 81  Resp: '16  20 15  '$ Temp: 98.3 F (36.8 C) 98.1 F (36.7 C) 98 F (36.7 C) 98.4 F (36.9 C)  TempSrc: Oral Oral Oral Oral  SpO2:   99% 97%  Weight:   133 lb (60.3 kg)   Height:        Intake/Output Summary (Last 24 hours) at 06/29/16 0916 Last data filed at 06/29/16 0600  Gross per 24 hour  Intake           349.63 ml  Output                0 ml  Net           349.63 ml   Filed Weights   06/28/16 1318 06/28/16 1754 06/29/16 0504  Weight: 135 lb (61.2 kg) 133 lb 11.2 oz (60.6 kg) 133 lb (60.3 kg)    Telemetry    SR - Personally Reviewed  ECG    SR with TWI in anterior leads - Personally Reviewed  Physical Exam   General: Well developed, well nourished, female appearing in no acute distress. Head: Normocephalic, atraumatic.  Neck: Supple without bruits, JVD. Lungs:  Resp regular and unlabored, CTA. Heart: RRR, S1, S2, no S3, S4, 3/6 systolic murmur; no rub. Abdomen: Soft, non-tender, non-distended with normoactive bowel sounds. No hepatomegaly. No rebound/guarding. No obvious abdominal masses. Extremities: No clubbing, cyanosis, edema. Distal pedal pulses are 2+ bilaterally. Neuro: Alert and oriented X 3. Moves all extremities spontaneously. Psych: Normal affect.  Labs    Chemistry Recent Labs Lab 06/28/16 1323  NA 140  K 4.8   CL 104  CO2 28  GLUCOSE 113*  BUN 15  CREATININE 0.72  CALCIUM 9.5  GFRNONAA >60  GFRAA >60  ANIONGAP 8     Hematology Recent Labs Lab 06/28/16 1323  WBC 8.6  RBC 4.49  HGB 13.3  HCT 41.3  MCV 92.0  MCH 29.6  MCHC 32.2  RDW 13.4  PLT 312    Cardiac Enzymes Recent Labs Lab 06/28/16 2113 06/29/16 0101 06/29/16 0746  TROPONINI <0.03 <0.03 <0.03    Recent Labs Lab 06/28/16 1334  TROPIPOC 0.00     BNPNo results for input(s): BNP, PROBNP in the last 168 hours.   DDimer No results for input(s): DDIMER in the last 168 hours.    Radiology    Dg Chest 2 View  Result Date: 06/28/2016 CLINICAL DATA:  Left chest pain for 1 day. History of right lower surgery. EXAM: CHEST  2 VIEW COMPARISON:  01/03/2016 and prior CTs. 06/24/2013 and prior radiographs FINDINGS: Cardiomediastinal silhouette is unchanged. Pulmonary stents are again identified. Right thoracic surgical changes are again noted with right hemithorax volume loss. There is no evidence of focal airspace disease, pulmonary edema, suspicious pulmonary nodule/mass, pleural effusion, or pneumothorax. No  acute bony abnormalities are identified. IMPRESSION: No evidence of acute cardiopulmonary disease. Electronically Signed   By: Margarette Canada M.D.   On: 06/28/2016 13:52    Cardiac Studies   TTE: Pending  Patient Profile     66 y.o. female with PMH of CAD had a complex left main , circumflex , LAD stenting after a dissection during PCI in 8/201, with PCI of the RCA complicated by dissection and required 3 stents, AS, HLD and lung Ca who presented to the office with chest pain. Sent to [facility] for admission for cardiac cath.   Assessment & Plan    1. Unstable Angina: Presented to the office with squeezing chest pain not associated with activity very similar to that she experienced with previous stent placement. Was worse yesterday when walking into the office. New TWI in anterior leads. Sent for admission with plans for  cardiac cath today. Trop neg x3. -- remains on IV heparin, no chest pain since this was started  -- The patient understands that risks included but are not limited to stroke (1 in 1000), death (1 in 1000), kidney failure [usually temporary] (1 in 500), bleeding (1 in 200), allergic reaction [possibly serious] (1 in 200).  -- continue ASA/Plavix  2. HL: On Repatha injections  3. Mild AS: Murmur seems more pronounced on exam. Echo pending.  4. Lung Ca s/p RUL lobectomy with lymph node dissection: Malignancy removed back in 12/14.   Signed, Reino Bellis, NP  06/29/2016, 9:16 AM   Patient seen and examined and history reviewed. Agree with above findings and plan. Patient is currently pain free. States chest pain this time worse than in the past. She has ruled out for MI. Normal troponin levels. No Ecg changes. She does have a murmur of AS. Echo is pending. Labs reviewed. Proceed with cardiac cath today.  Jwan Hornbaker [place], [place] 06/29/2016 11:33 AM

## 2016-06-29 NOTE — Progress Notes (Signed)
ANTICOAGULATION CONSULT NOTE - Follow Up Consult  Pharmacy Consult for Heparin  Indication: chest pain/ACS  Patient Measurements: Height: 4' 11.5" (151.1 cm) Weight: 133 lb (60.3 kg) IBW/kg (Calculated) : 44.35  Vital Signs: Temp: 98.4 F (36.9 C) (04/27 0749) Temp Source: Oral (04/27 0749) BP: 117/61 (04/27 0749) Pulse Rate: 81 (04/27 0749)  Labs:  Recent Labs  06/28/16 1323 06/28/16 1602 06/28/16 2113 06/28/16 2254 06/29/16 0101 06/29/16 0531  HGB 13.3  --   --   --   --   --   HCT 41.3  --   --   --   --   --   PLT 312  --   --   --   --   --   APTT  --  25  --   --   --   --   LABPROT  --  12.9  --   --   --   --   INR  --  0.97  --   --   --   --   HEPARINUNFRC  --   --   --  0.47  --  0.59  CREATININE 0.72  --   --   --   --   --   TROPONINI  --   --  <0.03  --  <0.03  --     Estimated Creatinine Clearance: 57.7 mL/min (by C-G formula based on SCr of 0.72 mg/dL).   Assessment: CC/HPI: CP unstable angina   PMH: CAD, COPD  Anticoag: none pta - IV hep now for r/o ACS. HL therapeutic x 2 on 800 units/hr  Renal: SCr 0.72  Heme/Onc: H&H 13.3/41.3, Plt 312  Goal of Therapy:  Heparin level 0.3-0.7 units/ml Monitor platelets by anticoagulation protocol: Yes   Plan:  Continue heparin 800 units/hr Daily HL, CBC LHC Fri? - no inpt cards notes yet  Levester Fresh, PharmD, BCPS, BCCCP Clinical Pharmacist Clinical phone for 06/29/2016 from 7a-3:30p: [phone] If after 3:30p, please call main pharmacy at: [phone] 06/29/2016 8:27 AM

## 2016-06-29 NOTE — H&P (View-Only) (Signed)
Progress Note  Patient Name: Teresa [name] Date of Encounter: 06/29/2016  Primary Cardiologist: Irish Lack  Subjective   No further chest pain since starting on heparin.   Inpatient Medications    Scheduled Meds: . aspirin EC  81 mg Oral Daily  . clopidogrel  75 mg Oral Q breakfast  . pantoprazole  40 mg Oral Q0600   Continuous Infusions: . heparin 800 Units/hr (06/28/16 1714)  . nitroGLYCERIN Stopped (06/28/16 1552)   PRN Meds: albuterol, nitroGLYCERIN, ondansetron (ZOFRAN) IV   Vital Signs    Vitals:   06/28/16 1951 06/29/16 0025 06/29/16 0504 06/29/16 0749  BP: (!) 108/55 118/62 140/65 117/61  Pulse: 86 68 83 81  Resp: '16  20 15  '$ Temp: 98.3 F (36.8 C) 98.1 F (36.7 C) 98 F (36.7 C) 98.4 F (36.9 C)  TempSrc: Oral Oral Oral Oral  SpO2:   99% 97%  Weight:   133 lb (60.3 kg)   Height:        Intake/Output Summary (Last 24 hours) at 06/29/16 0916 Last data filed at 06/29/16 0600  Gross per 24 hour  Intake           349.63 ml  Output                0 ml  Net           349.63 ml   Filed Weights   06/28/16 1318 06/28/16 1754 06/29/16 0504  Weight: 135 lb (61.2 kg) 133 lb 11.2 oz (60.6 kg) 133 lb (60.3 kg)    Telemetry    SR - Personally Reviewed  ECG    SR with TWI in anterior leads - Personally Reviewed  Physical Exam   General: Well developed, well nourished, female appearing in no acute distress. Head: Normocephalic, atraumatic.  Neck: Supple without bruits, JVD. Lungs:  Resp regular and unlabored, CTA. Heart: RRR, S1, S2, no S3, S4, 3/6 systolic murmur; no rub. Abdomen: Soft, non-tender, non-distended with normoactive bowel sounds. No hepatomegaly. No rebound/guarding. No obvious abdominal masses. Extremities: No clubbing, cyanosis, edema. Distal pedal pulses are 2+ bilaterally. Neuro: Alert and oriented X 3. Moves all extremities spontaneously. Psych: Normal affect.  Labs    Chemistry Recent Labs Lab 06/28/16 1323  NA 140  K 4.8   CL 104  CO2 28  GLUCOSE 113*  BUN 15  CREATININE 0.72  CALCIUM 9.5  GFRNONAA >60  GFRAA >60  ANIONGAP 8     Hematology Recent Labs Lab 06/28/16 1323  WBC 8.6  RBC 4.49  HGB 13.3  HCT 41.3  MCV 92.0  MCH 29.6  MCHC 32.2  RDW 13.4  PLT 312    Cardiac Enzymes Recent Labs Lab 06/28/16 2113 06/29/16 0101 06/29/16 0746  TROPONINI <0.03 <0.03 <0.03    Recent Labs Lab 06/28/16 1334  TROPIPOC 0.00     BNPNo results for input(s): BNP, PROBNP in the last 168 hours.   DDimer No results for input(s): DDIMER in the last 168 hours.    Radiology    Dg Chest 2 View  Result Date: 06/28/2016 CLINICAL DATA:  Left chest pain for 1 day. History of right lower surgery. EXAM: CHEST  2 VIEW COMPARISON:  01/03/2016 and prior CTs. 06/24/2013 and prior radiographs FINDINGS: Cardiomediastinal silhouette is unchanged. Pulmonary stents are again identified. Right thoracic surgical changes are again noted with right hemithorax volume loss. There is no evidence of focal airspace disease, pulmonary edema, suspicious pulmonary nodule/mass, pleural effusion, or pneumothorax. No  acute bony abnormalities are identified. IMPRESSION: No evidence of acute cardiopulmonary disease. Electronically Signed   By: Margarette Canada M.D.   On: 06/28/2016 13:52    Cardiac Studies   TTE: Pending  Patient Profile     66 y.o. female with PMH of CAD had a complex left main , circumflex , LAD stenting after a dissection during PCI in 8/201, with PCI of the RCA complicated by dissection and required 3 stents, AS, HLD and lung Ca who presented to the office with chest pain. Sent to [facility] for admission for cardiac cath.   Assessment & Plan    1. Unstable Angina: Presented to the office with squeezing chest pain not associated with activity very similar to that she experienced with previous stent placement. Was worse yesterday when walking into the office. New TWI in anterior leads. Sent for admission with plans for  cardiac cath today. Trop neg x3. -- remains on IV heparin, no chest pain since this was started  -- The patient understands that risks included but are not limited to stroke (1 in 1000), death (1 in 1000), kidney failure [usually temporary] (1 in 500), bleeding (1 in 200), allergic reaction [possibly serious] (1 in 200).  -- continue ASA/Plavix  2. HL: On Repatha injections  3. Mild AS: Murmur seems more pronounced on exam. Echo pending.  4. Lung Ca s/p RUL lobectomy with lymph node dissection: Malignancy removed back in 12/14.   Signed, Reino Bellis, NP  06/29/2016, 9:16 AM   Patient seen and examined and history reviewed. Agree with above findings and plan. Patient is currently pain free. States chest pain this time worse than in the past. She has ruled out for MI. Normal troponin levels. No Ecg changes. She does have a murmur of AS. Echo is pending. Labs reviewed. Proceed with cardiac cath today.  Carmaleta Youngers [place], [place] 06/29/2016 11:33 AM

## 2016-06-29 NOTE — Interval H&P Note (Signed)
Cath Lab Visit (complete for each Cath Lab visit)  Clinical Evaluation Leading to the Procedure:   ACS: Yes.    Non-ACS:    Anginal Classification: CCS IV  Anti-ischemic medical therapy: Minimal Therapy (1 class of medications)  Non-Invasive Test Results: No non-invasive testing performed  Prior CABG: No previous CABG      History and Physical Interval Note:  06/29/2016 4:48 PM  Teresa [name]  has presented today for surgery, with the diagnosis of cp  The various methods of treatment have been discussed with the patient and family. After consideration of risks, benefits and other options for treatment, the patient has consented to  Procedure(s): Left Heart Cath and Coronary Angiography (N/A) as a surgical intervention .  The patient's history has been reviewed, patient examined, no change in status, stable for surgery.  I have reviewed the patient's chart and labs.  Questions were answered to the patient's satisfaction.     Larae Grooms

## 2016-06-30 ENCOUNTER — Other Ambulatory Visit: Payer: Self-pay | Admitting: Physician Assistant

## 2016-06-30 DIAGNOSIS — I350 Nonrheumatic aortic (valve) stenosis: Secondary | ICD-10-CM

## 2016-06-30 DIAGNOSIS — R011 Cardiac murmur, unspecified: Secondary | ICD-10-CM

## 2016-06-30 DIAGNOSIS — I200 Unstable angina: Secondary | ICD-10-CM | POA: Diagnosis not present

## 2016-06-30 LAB — CBC
HEMATOCRIT: 36.5 % (ref 36.0–46.0)
HEMOGLOBIN: 11.7 g/dL — AB (ref 12.0–15.0)
MCH: 29.3 pg (ref 26.0–34.0)
MCHC: 32.1 g/dL (ref 30.0–36.0)
MCV: 91.3 fL (ref 78.0–100.0)
Platelets: 266 10*3/uL (ref 150–400)
RBC: 4 MIL/uL (ref 3.87–5.11)
RDW: 13.2 % (ref 11.5–15.5)
WBC: 5.9 10*3/uL (ref 4.0–10.5)

## 2016-06-30 LAB — MRSA PCR SCREENING: MRSA by PCR: NEGATIVE

## 2016-06-30 MED ORDER — NITROGLYCERIN 0.4 MG SL SUBL: 0.4000 mg | SUBLINGUAL_TABLET | SUBLINGUAL | 4 refills | Status: DC | PRN

## 2016-06-30 MED ORDER — PANTOPRAZOLE SODIUM 40 MG PO TBEC
40.0000 mg | DELAYED_RELEASE_TABLET | Freq: Every day | ORAL | 0 refills | Status: DC
Start: 2016-07-01 — End: 2016-06-30

## 2016-06-30 MED ORDER — PANTOPRAZOLE SODIUM 40 MG PO TBEC: 40.0000 mg | DELAYED_RELEASE_TABLET | Freq: Every day | ORAL | 3 refills | Status: DC

## 2016-06-30 NOTE — Progress Notes (Signed)
Progress Note  Patient Name: Teresa [name] Date of Encounter: 06/30/2016  Primary Cardiologist: [place]   Wants to go home   Inpatient Medications    Scheduled Meds: . aspirin EC  81 mg Oral Daily  . clopidogrel  75 mg Oral Q breakfast  . famotidine  10 mg Oral QHS  . pantoprazole  40 mg Oral Q0600  . sodium chloride flush  3 mL Intravenous Q12H   Continuous Infusions: . sodium chloride    . nitroGLYCERIN Stopped (06/28/16 1552)   PRN Meds: sodium chloride, acetaminophen, albuterol, nitroGLYCERIN, ondansetron (ZOFRAN) IV, simethicone, sodium chloride flush   Vital Signs    Vitals:   06/29/16 2350 06/30/16 0000 06/30/16 0350 06/30/16 0437  BP: (!) 105/57  (!) 123/59   Pulse: 73  72   Resp: 13  12   Temp:  98.1 F (36.7 C)  97.4 F (36.3 C)  TempSrc:  Oral  Oral  SpO2: 96%  95%   Weight:    133 lb 8 oz (60.6 kg)  Height:        Intake/Output Summary (Last 24 hours) at 06/30/16 0826 Last data filed at 06/30/16 0520  Gross per 24 hour  Intake          1419.38 ml  Output                0 ml  Net          1419.38 ml   Filed Weights   06/28/16 1754 06/29/16 0504 06/30/16 0437  Weight: 133 lb 11.2 oz (60.6 kg) 133 lb (60.3 kg) 133 lb 8 oz (60.6 kg)    Telemetry    NSR no VT 06/30/2016 personally reviewed   ECG    SR with TWI in anterior leads - Personally Reviewed  Physical Exam   Affect appropriate Healthy:  appears stated age 66: normal Neck supple with no adenopathy JVP normal no bruits no thyromegaly Lungs clear with no wheezing Post RULobectomy  and good diaphragmatic motion Heart:  S1/S2 AS murmur, no rub, gallop or click PMI normal Abdomen: benighn, BS positve, no tenderness, no AAA no bruit.  No HSM or HJR Distal pulses intact with no bruits No edema Neuro non-focal Skin warm and dry No muscular weakness Right radial cath sight A no hematoma   Labs    Chemistry  Recent Labs Lab 06/28/16 1323  NA 140  K 4.8    CL 104  CO2 28  GLUCOSE 113*  BUN 15  CREATININE 0.72  CALCIUM 9.5  GFRNONAA >60  GFRAA >60  ANIONGAP 8     Hematology  Recent Labs Lab 06/28/16 1323 06/30/16 0412  WBC 8.6 5.9  RBC 4.49 4.00  HGB 13.3 11.7*  HCT 41.3 36.5  MCV 92.0 91.3  MCH 29.6 29.3  MCHC 32.2 32.1  RDW 13.4 13.2  PLT 312 266    Cardiac Enzymes  Recent Labs Lab 06/28/16 2113 06/29/16 0101 06/29/16 0746  TROPONINI <0.03 <0.03 <0.03     Recent Labs Lab 06/28/16 1334  TROPIPOC 0.00     BNPNo results for input(s): BNP, PROBNP in the last 168 hours.   DDimer No results for input(s): DDIMER in the last 168 hours.    Radiology    Dg Chest 2 View  Result Date: 06/28/2016 CLINICAL DATA:  Left chest pain for 1 day. History of right lower surgery. EXAM: CHEST  2 VIEW COMPARISON:  01/03/2016 and prior CTs. 06/24/2013 and prior  radiographs FINDINGS: Cardiomediastinal silhouette is unchanged. Pulmonary stents are again identified. Right thoracic surgical changes are again noted with right hemithorax volume loss. There is no evidence of focal airspace disease, pulmonary edema, suspicious pulmonary nodule/mass, pleural effusion, or pneumothorax. No acute bony abnormalities are identified. IMPRESSION: No evidence of acute cardiopulmonary disease. Electronically Signed   By: Margarette Canada M.D.   On: 06/28/2016 13:52    Cardiac Studies   TTE: Pending  Patient Profile     66 y.o. female with PMH of CAD had a complex left main , circumflex , LAD stenting after a dissection during PCI in 8/201, with PCI of the RCA complicated by dissection and required 3 stents, AS, HLD and lung Ca who presented to the office with chest pain. Sent to [facility] for admission for cardiac cath.   Assessment & Plan    1. Unstable Angina: reviewed cath films all stents patient with no significant stenosis continue medical Rx ? GI etiology of pain d/ chome on protonix   2. HL: On Repatha injections  3. Mild AS: Murmur can  have outpatient echo would not delay d/c    4. Lung Ca s/p RUL lobectomy with lymph node dissection: Malignancy removed back in 12/14.   Jenkins Rouge

## 2016-06-30 NOTE — Plan of Care (Signed)
Problem: Education: Goal: Understanding of CV disease, CV risk reduction, and recovery process will improve Outcome: Progressing RN reviewed vascular access site care with patient.  RN reviewed right arm/wrist restrictions with patient related to heart catheterization.  RN instructed patient to leave dressing in place that RN had applied over vascular access site for twenty-four hours.  Patient stated understanding.

## 2016-06-30 NOTE — Discharge Summary (Signed)
Discharge Summary    Patient ID: Teresa [name],  MRN: [id number], DOB/AGE: Jun 17, 1950 66 y.o.  Admit date: 06/28/2016 Discharge date: 06/30/2016  Primary Care Provider: Lucienne [facility] Primary Cardiologist: Dr. Irish Lack  Discharge Diagnoses    Principal Problem:   Unstable angina [facility]) Active Problems:   Aortic stenosis   COPD GOLD II   Smoker   Peripheral vascular disease, unspecified (HCC)   CAD (coronary artery disease), native coronary artery   Mixed hyperlipidemia   Lung cancer, upper lobe (HCC)   Allergies Allergies  Allergen Reactions  . Azithromycin Shortness Of Breath  . Ceclor [Cefaclor] Shortness Of Breath  . Lipitor [Atorvastatin] Other (See Comments)    Severe muscle aches  . Morphine And Related Other (See Comments)    Terrible headache  . Penicillins Other (See Comments)    Unknown from childhood  . Septra [Sulfamethoxazole-Trimethoprim] Shortness Of Breath and Rash  . Simvastatin Other (See Comments)    Severe muscle aches  . Vicodin [Hydrocodone-Acetaminophen] Nausea And Vomiting  . Zetia [Ezetimibe] Other (See Comments)    Severe stomach pain  . Doxycycline Swelling and Other (See Comments)    Redness on the face  . Adhesive [Tape] Other (See Comments)    Redness and swelling - use paper tape    Diagnostic Studies/Procedures    Cath 06/29/2016 Conclusion     Patent stents in the left main, LAD, circumflex, and RCA.  Mild disease in the proximal RCA.  There is mild aortic valve stenosis.  The left ventricular ejection fraction is 45-50% by visual estimate.  LV end diastolic pressure is normal.  There is mild left ventricular systolic dysfunction wth persistent apical hypokinesis.   Continue aggressive secondary prevention. She can likely be discharged home tomorrow.  Consider GI related causes of her chest discomfort.   _____________   History of Present Illness     Teresa [name] is a 66 y.o. female who has CAD  had a complex left main , circumflex , LAD stenting after a dissection during PCI in 10/2012. She had PCI of the RCA complicated by dissection and required 3 stents. Mild AS, HLD intolerant to statins and now on PCSK9 inhibitor Repatha. She had successful lung surgery to remove a malignancy in 02/2013.She has occasional spasms in her chest- where her lobectomy was, near the incision. They last a second at the most and resolve spontaneously. No prolonged pain. No pain with exercise. Last saw Dr. Irish Lack 08/18/15 and chest pain felt to be atypical.  She was seen in the cardiology office on 06/30/2016 for chest pain. It occurs both with and without exertion. There was some new T-wave inversion on EKG. Patient was admitted from the office for unstable angina. The case was discussed with Dr. Acie Fredrickson in the office.  Hospital Course     Given unstable angina symptom, patient underwent cardiac catheterization on 06/29/2016, this showed patent stents in the left main, LAD, left circumflex and RCA. There was mild disease in the proximal RCA and mild aortic valve stenosis, otherwise EF 45-50%. No cardiac etiology was identified. Patient was seen on the following morning on 06/30/2016 by Dr. Johnsie Cancel, she is deemed stable for discharge from cartilage perspective. There was questionable GI etiology for her chest discomfort, we plan to discharge the patient home on Protonix. We will also arrange outpatient echocardiogram to evaluate the murmur which is likely aortic stenosis.  _____________  Discharge Vitals Blood pressure (!) 123/59, pulse 72, temperature 97.4 F (  36.3 C), temperature source Oral, resp. rate 12, height 4' 11.5" (1.511 m), weight 133 lb 8 oz (60.6 kg), last menstrual period 03/25/2011, SpO2 95 %.  Filed Weights   06/28/16 1754 06/29/16 0504 06/30/16 0437  Weight: 133 lb 11.2 oz (60.6 kg) 133 lb (60.3 kg) 133 lb 8 oz (60.6 kg)    Labs & Radiologic Studies    CBC  Recent Labs  06/28/16 1323  06/30/16 0412  WBC 8.6 5.9  HGB 13.3 11.7*  HCT 41.3 36.5  MCV 92.0 91.3  PLT 312 884   Basic Metabolic Panel  Recent Labs  06/28/16 1323 06/28/16 1602  NA 140  --   K 4.8  --   CL 104  --   CO2 28  --   GLUCOSE 113*  --   BUN 15  --   CREATININE 0.72  --   CALCIUM 9.5  --   MG  --  2.1   Liver Function Tests No results for input(s): AST, ALT, ALKPHOS, BILITOT, PROT, ALBUMIN in the last 72 hours. No results for input(s): LIPASE, AMYLASE in the last 72 hours. Cardiac Enzymes  Recent Labs  06/28/16 2113 06/29/16 0101 06/29/16 0746  TROPONINI <0.03 <0.03 <0.03   BNP Invalid input(s): POCBNP D-Dimer No results for input(s): DDIMER in the last 72 hours. Hemoglobin A1C No results for input(s): HGBA1C in the last 72 hours. Fasting Lipid Panel No results for input(s): CHOL, HDL, LDLCALC, TRIG, CHOLHDL, LDLDIRECT in the last 72 hours. Thyroid Function Tests No results for input(s): TSH, T4TOTAL, T3FREE, THYROIDAB in the last 72 hours.  Invalid input(s): FREET3 _____________  Dg Chest 2 View  Result Date: 06/28/2016 CLINICAL DATA:  Left chest pain for 1 day. History of right lower surgery. EXAM: CHEST  2 VIEW COMPARISON:  01/03/2016 and prior CTs. 06/24/2013 and prior radiographs FINDINGS: Cardiomediastinal silhouette is unchanged. Pulmonary stents are again identified. Right thoracic surgical changes are again noted with right hemithorax volume loss. There is no evidence of focal airspace disease, pulmonary edema, suspicious pulmonary nodule/mass, pleural effusion, or pneumothorax. No acute bony abnormalities are identified. IMPRESSION: No evidence of acute cardiopulmonary disease. Electronically Signed   By: Margarette Canada M.D.   On: 06/28/2016 13:52   Disposition   Pt is being discharged home today in good condition.  Follow-up Plans & Appointments    Follow-up Information    Larae Grooms, MD Follow up on 08/28/2016.   Specialties:  Cardiology, Radiology,  Interventional Cardiology Why:  '@2'$ :40pm. Contact information: [address] [address] [phone]        [place] [facility] Follow up.   Specialty:  Cardiology Why:  scheduler will contact you to arrange outpatient echo Contact information: [address] [place] [phone]         Discharge Instructions    Diet - low sodium heart healthy    Complete by:  As directed    Discharge instructions    Complete by:  As directed    No lifting over 5 lbs for 1 week. No sexual activity for 1 week. Keep procedure site clean & dry. If you notice increased pain, swelling, bleeding or pus, call/return!  You may shower, but no soaking baths/hot tubs/pools for 1 week.   Increase activity slowly    Complete by:  As directed       Discharge Medications   Current Discharge Medication List    START taking  these medications   Details  pantoprazole (PROTONIX) 40 MG tablet Take 1 tablet (40 mg total) by mouth daily at 6 (six) AM. Qty: 90 tablet, Refills: 3      CONTINUE these medications which have NOT CHANGED   Details  albuterol (PROVENTIL HFA) 108 (90 BASE) MCG/ACT inhaler Inhale 2 puffs into the lungs 2 (two) times daily as needed for wheezing or shortness of breath.     cholecalciferol (VITAMIN D) 1000 UNITS tablet Take 1 tablet (1,000 Units total) by mouth at bedtime. Qty: 90 tablet, Refills: 0    clopidogrel (PLAVIX) 75 MG tablet Take 1 tablet (75 mg total) by mouth daily. Qty: 90 tablet, Refills: 0    fluticasone (FLONASE) 50 MCG/ACT nasal spray INHALE 2 SPRAYS IN EACH NOSTRIL AS NEEDED DAILY for allergies Refills: 4    loratadine-pseudoephedrine (CLARITIN-D 24-HOUR) 10-240 MG 24 hr tablet Take 1 tablet by mouth daily as needed for allergies.    NASAL SALINE NA Place 1 spray into the nose daily as needed (congestion).     nitroGLYCERIN (NITROSTAT) 0.4 MG SL tablet Place 1 tablet (0.4 mg  total) under the tongue every 5 (five) minutes as needed for chest pain. Qty: 25 tablet, Refills: 4    OVER THE COUNTER MEDICATION Take 1 tablet by mouth daily with lunch. [facility] Menopause support    ranitidine (ZANTAC) 150 MG tablet Take 150 mg by mouth daily as needed for heartburn.     simethicone (MYLICON) 80 MG chewable tablet Chew 160 mg by mouth every 6 (six) hours as needed for flatulence.    Evolocumab (REPATHA SURECLICK) 381 MG/ML SOAJ Inject 140 mg into the skin every 14 (fourteen) days. Qty: 2 pen, Refills: 11      STOP taking these medications     lansoprazole (PREVACID) 30 MG capsule            Outstanding Labs/Studies   Outpatient echocardiogram   Duration of Discharge Encounter   Greater than 30 minutes including physician time.  Signed, Almyra Deforest PA-C 06/30/2016, 8:51 AM

## 2016-06-30 NOTE — Care Management Note (Signed)
Case Management Note  Patient Details  Name: DESIREA MIZRAHI MRN: [id number] Date of Birth: Mar 05, 1951  Subjective/Objective:                 Patient with order to DC to home, no CM needs, orders, or consult at this time.    Action/Plan:  DC to home self care.  Expected Discharge Date:  06/30/16               Expected Discharge Plan:  Home/Self Care  In-House Referral:     Discharge planning Services  CM Consult  Post Acute Care Choice:    Choice offered to:     DME Arranged:    DME Agency:     HH Arranged:    HH Agency:     Status of Service:  Completed, signed off  If discussed at [employer] of Stay Meetings, dates discussed:    Additional Comments:  Carles Collet, RN 06/30/2016, 9:39 AM

## 2016-06-30 NOTE — Discharge Instructions (Signed)
Radial Site Care Refer to this sheet in the next few weeks. These instructions provide you with information about caring for yourself after your procedure. Your health care provider may also give you more specific instructions. Your treatment has been planned according to current medical practices, but problems sometimes occur. Call your health care provider if you have any problems or questions after your procedure. What can I expect after the procedure? After your procedure, it is typical to have the following:  Bruising at the radial site that usually fades within 1-2 weeks.  Blood collecting in the tissue (hematoma) that may be painful to the touch. It should usually decrease in size and tenderness within 1-2 weeks. Follow these instructions at home:  Take medicines only as directed by your health care provider.  You may shower 24-48 hours after the procedure or as directed by your health care provider. Remove the bandage (dressing) and gently wash the site with plain soap and water. Pat the area dry with a clean towel. Do not rub the site, because this may cause bleeding.  Do not take baths, swim, or use a hot tub until your health care provider approves.  Check your insertion site every day for redness, swelling, or drainage.  Do not apply powder or lotion to the site.  Do not flex or bend the affected arm for 24 hours or as directed by your health care provider.  Do not push or pull heavy objects with the affected arm for 24 hours or as directed by your health care provider.  Do not lift over 10 lb (4.5 kg) for 5 days after your procedure or as directed by your health care provider.  Ask your health care provider when it is okay to:  Return to work or school.  Resume usual physical activities or sports.  Resume sexual activity.  Do not drive home if you are discharged the same day as the procedure. Have someone else drive you.  You may drive 24 hours after the procedure  unless otherwise instructed by your health care provider.  Do not operate machinery or power tools for 24 hours after the procedure.  If your procedure was done as an outpatient procedure, which means that you went home the same day as your procedure, a responsible adult should be with you for the first 24 hours after you arrive home.  Keep all follow-up visits as directed by your health care provider. This is important. Contact a health care provider if:  You have a fever.  You have chills.  You have increased bleeding from the radial site. Hold pressure on the site. Get help right away if:  You have unusual pain at the radial site.  You have redness, warmth, or swelling at the radial site.  You have drainage (other than a small amount of blood on the dressing) from the radial site.  The radial site is bleeding, and the bleeding does not stop after 30 minutes of holding steady pressure on the site.  Your arm or hand becomes pale, cool, tingly, or numb. This information is not intended to replace advice given to you by your health care provider. Make sure you discuss any questions you have with your health care provider. Document Released: 03/24/2010 Document Revised: 07/28/2015 Document Reviewed: 09/07/2013 Elsevier Interactive Patient Education  2017 [place] Heart-healthy meal planning includes:  Limiting unhealthy fats.  Increasing healthy fats.  Making other small dietary changes. You may need  to talk with your doctor or a diet specialist (dietitian) to create an eating plan that is right for you. What types of fat should I choose?  Choose healthy fats. These include olive oil and canola oil, flaxseeds, walnuts, almonds, and seeds.  Eat more omega-3 fats. These include salmon, mackerel, sardines, tuna, flaxseed oil, and ground flaxseeds. Try to eat fish at least twice each week.  Limit saturated fats.  Saturated fats are often  found in animal products, such as meats, butter, and cream.  Plant sources of saturated fats include palm oil, palm kernel oil, and coconut oil.  Avoid foods with partially hydrogenated oils in them. These include stick margarine, some tub margarines, cookies, crackers, and other baked goods. These contain trans fats. What general guidelines do I need to follow?  Check food labels carefully. Identify foods with trans fats or high amounts of saturated fat.  Fill one half of your plate with vegetables and green salads. Eat 4-5 servings of vegetables per day. A serving of vegetables is:  1 cup of raw leafy vegetables.   cup of raw or cooked cut-up vegetables.   cup of vegetable juice.  Fill one fourth of your plate with whole grains. Look for the word "whole" as the first word in the ingredient list.  Fill one fourth of your plate with lean protein foods.  Eat 4-5 servings of fruit per day. A serving of fruit is:  One medium whole fruit.   cup of dried fruit.   cup of fresh, frozen, or canned fruit.   cup of 100% fruit juice.  Eat more foods that contain soluble fiber. These include apples, broccoli, carrots, beans, peas, and barley. Try to get 20-30 g of fiber per day.  Eat more home-cooked food. Eat less restaurant, buffet, and fast food.  Limit or avoid alcohol.  Limit foods high in starch and sugar.  Avoid fried foods.  Avoid frying your food. Try baking, boiling, grilling, or broiling it instead. You can also reduce fat by:  Removing the skin from poultry.  Removing all visible fats from meats.  Skimming the fat off of stews, soups, and gravies before serving them.  Steaming vegetables in water or broth.  Lose weight if you are overweight.  Eat 4-5 servings of nuts, legumes, and seeds per week:  One serving of dried beans or legumes equals  cup after being cooked.  One serving of nuts equals 1 ounces.  One serving of seeds equals  ounce or one  tablespoon.  You may need to keep track of how much salt or sodium you eat. This is especially true if you have high blood pressure. Talk with your doctor or dietitian to get more information. What foods can I eat? Grains  Breads, including [place], white, pita, wheat, raisin, rye, oatmeal, and [place]. Tortillas that are neither fried nor made with lard or trans fat. Low-fat rolls, including hotdog and hamburger buns and English muffins. Biscuits. Muffins. Waffles. Pancakes. Light popcorn. Whole-grain cereals. Flatbread. Melba toast. Pretzels. Breadsticks. Rusks. Low-fat snacks. Low-fat crackers, including oyster, saltine, matzo, graham, animal, and rye. Rice and pasta, including brown rice and pastas that are made with whole wheat. Vegetables  All vegetables. Fruits  All fruits, but limit coconut. Meats and Other Protein Sources  Lean, well-trimmed beef, veal, pork, and lamb. Chicken and [place] without skin. All fish and shellfish. Wild duck, rabbit, pheasant, and venison. Egg whites or low-cholesterol egg substitutes. Dried beans, peas, lentils, and tofu. Seeds and  most nuts. Dairy  Low-fat or nonfat cheeses, including ricotta, string, and mozzarella. Skim or 1% milk that is liquid, powdered, or evaporated. Buttermilk that is made with low-fat milk. Nonfat or low-fat yogurt. Beverages  Mineral water. Diet carbonated beverages. Sweets and Desserts  Sherbets and fruit ices. Honey, jam, marmalade, jelly, and syrups. Meringues and gelatins. Pure sugar candy, such as hard candy, jelly beans, gumdrops, mints, marshmallows, and small amounts of dark chocolate. [employer] Eat all sweets and desserts in moderation. Fats and Oils  Nonhydrogenated (trans-free) margarines. Vegetable oils, including soybean, sesame, sunflower, olive, peanut, safflower, corn, canola, and cottonseed. Salad dressings or mayonnaise made with a vegetable oil. Limit added fats and oils that you use for cooking, baking,  salads, and as spreads. Other  Cocoa powder. Coffee and tea. All seasonings and condiments. The items listed above may not be a complete list of recommended foods or beverages. Contact your dietitian for more options.  What foods are not recommended? Grains  Breads that are made with saturated or trans fats, oils, or whole milk. Croissants. Butter rolls. Cheese breads. Sweet rolls. Donuts. Buttered popcorn. Chow mein noodles. High-fat crackers, such as cheese or butter crackers. Meats and Other Protein Sources  Fatty meats, such as hotdogs, short ribs, sausage, spareribs, bacon, rib eye roast or steak, and mutton. High-fat deli meats, such as salami and bologna. Caviar. Domestic duck and goose. Organ meats, such as kidney, liver, sweetbreads, and heart. Dairy  Cream, sour cream, cream cheese, and creamed cottage cheese. Whole-milk cheeses, including blue (bleu), Monte[place], [place], [place], American, [place], Swiss, cheddar, [place], and [place]. Whole or 2% milk that is liquid, evaporated, or condensed. Whole buttermilk. Cream sauce or high-fat cheese sauce. Yogurt that is made from whole milk. Beverages  Regular sodas and juice drinks with added sugar. Sweets and Desserts  Frosting. Pudding. Cookies. Cakes other than angel food cake. Candy that has milk chocolate or white chocolate, hydrogenated fat, butter, coconut, or unknown ingredients. Buttered syrups. Full-fat ice cream or ice cream drinks. Fats and Oils  Gravy that has suet, meat fat, or shortening. Cocoa butter, hydrogenated oils, palm oil, coconut oil, palm kernel oil. These can often be found in baked products, candy, fried foods, nondairy creamers, and whipped toppings. Solid fats and shortenings, including bacon fat, salt pork, lard, and butter. Nondairy cream substitutes, such as coffee creamers and sour cream substitutes. Salad dressings that are made of unknown oils, cheese, or sour cream. The items listed above may not be a  complete list of foods and beverages to avoid. Contact your dietitian for more information.  This information is not intended to replace advice given to you by your health care provider. Make sure you discuss any questions you have with your health care provider. Document Released: 08/21/2011 Document Revised: 07/28/2015 Document Reviewed: 08/13/2013 Elsevier Interactive Patient Education  2017 [employer].

## 2016-06-30 NOTE — Plan of Care (Signed)
Problem: Cardiovascular: Goal: Vascular access site(s) Level 0-1 will be maintained Outcome: Progressing Patient's vascular access site level 0.

## 2016-07-02 ENCOUNTER — Encounter (HOSPITAL_COMMUNITY): Payer: Self-pay | Admitting: Interventional Cardiology

## 2016-07-04 ENCOUNTER — Other Ambulatory Visit: Payer: Self-pay

## 2016-07-04 ENCOUNTER — Ambulatory Visit (HOSPITAL_COMMUNITY): Payer: Commercial Managed Care - PPO | Attending: Cardiology

## 2016-07-04 DIAGNOSIS — R011 Cardiac murmur, unspecified: Secondary | ICD-10-CM | POA: Diagnosis not present

## 2016-07-04 DIAGNOSIS — J449 Chronic obstructive pulmonary disease, unspecified: Secondary | ICD-10-CM | POA: Diagnosis not present

## 2016-07-04 DIAGNOSIS — Z720 Tobacco use: Secondary | ICD-10-CM | POA: Diagnosis not present

## 2016-07-04 DIAGNOSIS — I2510 Atherosclerotic heart disease of native coronary artery without angina pectoris: Secondary | ICD-10-CM | POA: Diagnosis not present

## 2016-07-04 DIAGNOSIS — E785 Hyperlipidemia, unspecified: Secondary | ICD-10-CM | POA: Diagnosis not present

## 2016-07-04 DIAGNOSIS — I252 Old myocardial infarction: Secondary | ICD-10-CM | POA: Diagnosis not present

## 2016-07-04 DIAGNOSIS — I350 Nonrheumatic aortic (valve) stenosis: Secondary | ICD-10-CM | POA: Diagnosis present

## 2016-07-04 DIAGNOSIS — I739 Peripheral vascular disease, unspecified: Secondary | ICD-10-CM | POA: Diagnosis not present

## 2016-07-04 MED ORDER — PERFLUTREN LIPID MICROSPHERE
1.0000 mL | INTRAVENOUS | Status: AC | PRN
  Administered 2016-07-04: 1 mL via INTRAVENOUS

## 2016-07-05 ENCOUNTER — Telehealth: Payer: Self-pay | Admitting: *Deleted

## 2016-07-05 NOTE — Telephone Encounter (Signed)
Left msg to call.

## 2016-07-05 NOTE — Telephone Encounter (Signed)
Patient returning call to [facility] , thanks.

## 2016-07-05 NOTE — Telephone Encounter (Signed)
-----   Message from Rogelia Mire, NP sent at 07/04/2016  4:56 PM EDT ----- Echo ordered after recent hospitalization to eval murmur.  EF mildly reduced - similar to what was seen on cath.  She has severe aortic stenosis and will need f/u with Dr. Irish Lack to discuss in more detail.  This may have contributed to her chest pain on admission.  She currently has f/u with Dr. Clayton Bibles in June, but this should be moved up if possible.  Alternatively, she could see an APP on Dr. Ernestene Kiel team Idolina Primer, Rosita Fire, or [place]) on a day that Dr. Clayton Bibles is in the office.

## 2016-07-05 NOTE — Telephone Encounter (Signed)
Returned call and spoke to patient in regards to echo. Pt voiced understanding. She was added to appt opening tomorrow w Dr. Irish Lack - advised to call if further questions concerns in interim.

## 2016-07-06 ENCOUNTER — Ambulatory Visit (INDEPENDENT_AMBULATORY_CARE_PROVIDER_SITE_OTHER): Payer: Commercial Managed Care - PPO | Admitting: Interventional Cardiology

## 2016-07-06 ENCOUNTER — Encounter: Payer: Self-pay | Admitting: Interventional Cardiology

## 2016-07-06 VITALS — BP 106/80 | HR 87 | Ht 59.5 in | Wt 135.0 lb

## 2016-07-06 DIAGNOSIS — I350 Nonrheumatic aortic (valve) stenosis: Secondary | ICD-10-CM | POA: Diagnosis not present

## 2016-07-06 DIAGNOSIS — I25119 Atherosclerotic heart disease of native coronary artery with unspecified angina pectoris: Secondary | ICD-10-CM | POA: Diagnosis not present

## 2016-07-06 MED ORDER — CLOPIDOGREL BISULFATE 75 MG PO TABS: 75.0000 mg | ORAL_TABLET | Freq: Every day | ORAL | 3 refills | Status: DC

## 2016-07-06 NOTE — Progress Notes (Signed)
Patient ID: Teresa [name], female   DOB: 04/28/1950, 66 y.o.   MRN: [id number]     Cardiology Office Note   Date:  07/06/2016   ID:  Teresa [name] 12-11-1950, MRN [id number]  PCP:  Teresa Roup, MD    No chief complaint on file. CAD, aortic stenosis   Wt Readings from Last 3 Encounters:  07/06/16 135 lb (61.2 kg)  06/30/16 133 lb 8 oz (60.6 kg)  06/28/16 134 lb 9.6 oz (61.1 kg)       History of Present Illness: Teresa [name] is a 65 y.o. female  who has had a complex left main , circumflex , LAD stenting after a dissection during PCI in 8/14. She had PCI of the RCA complicated by dissection and required 3 stents. She had successful lung surgery to remove a malignancy in 12/14.   She had chest discomfort concerning for unstable angina in April 2018. She underwent cardiac catheterization which showed widely patent stents. Her aortic stenosis was mild by cardiac cath. She subtotally underwent an echocardiogram. It was estimated that her aortic stenosis was severe.  She is doing well since that time. She has not had problems with regular walking. She has not had any excessive shortness of breath either.  She continues to work at a daycare. She has not had fluid retention, lightheadedness or syncope. She was very concerned about the echo results.        She has lost 20 lbs with increased exercise and better diet.      Past Medical History:  Diagnosis Date  . Aortic valve stenosis, moderate   . Arthritis    "across my hips; buttocks; comes w/the weather" (02/27/2013)  . CAD (coronary artery disease), native coronary artery   . Cataract    "just the beginnings on the right" (02/27/2013)  . Cellulitis 06/20/2012   "dog bite; right forearm" (06/24/2012)  . Chronic bronchitis ([place])    "used to have it alot; haven't had it in a long time" (02/27/2013)  . COPD (chronic obstructive pulmonary disease) ([place])    "little bit" (02/27/2013)  . Exertional shortness  of breath    "sometimes" (02/27/2013)  . GERD (gastroesophageal reflux disease)   . Heart murmur   . Hypercholesterolemia   . Mass of lung    "small tumor RUL; they are watching it" (02/27/2013)  . Myocardial infarction [facility])    "think dr said I've had 2 silent one" (02/27/2013)  . Nonspecific abnormal unspecified cardiovascular function study   . Other and unspecified angina pectoris   . Peripheral vascular disease, unspecified ([place])   . Pneumonia 1990's   "once" (02/27/2013)  . Seasonal allergies   . Tachycardia, unspecified   . Tobacco abuse     Past Surgical History:  Procedure Laterality Date  . CORONARY ANGIOPLASTY WITH STENT PLACEMENT  10/2012; 12/08/2012   "7 + 3" (02/27/2013)  . ESOPHAGOGASTRODUODENOSCOPY (EGD) WITH PROPOFOL N/A 01/11/2015   Procedure: ESOPHAGOGASTRODUODENOSCOPY (EGD) WITH PROPOFOL;  Surgeon: Garlan Fair, MD;  Location: [facility] ENDOSCOPY;  Service: Endoscopy;  Laterality: N/A;  . FLEXIBLE BRONCHOSCOPY N/A 03/02/2013   Procedure: FLEXIBLE BRONCHOSCOPY;  Surgeon: Gaye Pollack, MD;  Location: [place];  Service: Thoracic;  Laterality: N/A;  . FLEXIBLE SIGMOIDOSCOPY N/A 01/11/2015   Procedure: FLEXIBLE SIGMOIDOSCOPY;  Surgeon: Garlan Fair, MD;  Location: [facility] ENDOS[facility];  Service: Endoscopy;  Laterality: N/A;  unable to complete colon-prep issues  . GANGLION CYST EXCISION Left 1975   "wrist"  .  LEFT HEART CATH AND CORONARY ANGIOGRAPHY N/A 06/29/2016   Procedure: Left Heart Cath and Coronary Angiography;  Surgeon: Jettie Booze, MD;  Location: [facility] CV LAB;  Service: Cardiovascular;  Laterality: N/A;  . LEFT HEART CATHETERIZATION WITH CORONARY ANGIOGRAM N/A 12/08/2012   Procedure: LEFT HEART CATHETERIZATION WITH CORONARY ANGIOGRAM;  Surgeon: Jettie Booze, MD;  Location: [facility] CATH LAB;  Service: Cardiovascular;  Laterality: N/A;  . MOUTH SURGERY  2010?   "for bone loss" (06/24/2012)  . PERCUTANEOUS CORONARY STENT INTERVENTION (PCI-S) N/A 10/09/2012     Procedure: PERCUTANEOUS CORONARY STENT INTERVENTION (PCI-S);  Surgeon: Jettie Booze, MD;  Location: [facility] CATH LAB;  Service: Cardiovascular;  Laterality: N/A;  . THORACOTOMY/LOBECTOMY Right 03/02/2013   Procedure: Right Video Assisted Thoracoscopy/Thoracotomy with upper Lobectomy;  Surgeon: Gaye Pollack, MD;  Location: [facility] OR;  Service: Thoracic;  Laterality: Right;  Right Lung Upper  Lobectomy   . TONSILLECTOMY  1960  . VIDEO BRONCHOSCOPY  09/12/2011   Procedure: VIDEO BRONCHOSCOPY WITH FLUORO;  Surgeon: Tanda Rockers, MD;  Location: [facility];  Service: Cardiopulmonary;  Laterality: Bilateral;     Current Outpatient Prescriptions  Medication Sig Dispense Refill  . albuterol (PROVENTIL HFA) 108 (90 BASE) MCG/ACT inhaler Inhale 2 puffs into the lungs 2 (two) times daily as needed for wheezing or shortness of breath.     . cholecalciferol (VITAMIN D) 1000 UNITS tablet Take 1 tablet (1,000 Units total) by mouth at bedtime. 90 tablet 0  . clopidogrel (PLAVIX) 75 MG tablet Take 1 tablet (75 mg total) by mouth daily. 90 tablet 0  . Evolocumab (REPATHA SURECLICK) 829 MG/ML SOAJ Inject 140 mg into the skin every 14 (fourteen) days. 2 pen 11  . fluticasone (FLONASE) 50 MCG/ACT nasal spray INHALE 2 SPRAYS IN EACH NOSTRIL AS NEEDED DAILY for allergies  4  . loratadine-pseudoephedrine (CLARITIN-D 24-HOUR) 10-240 MG 24 hr tablet Take 1 tablet by mouth daily as needed for allergies.    [name] NASAL SALINE NA Place 1 spray into the nose daily as needed (congestion).     . nitroGLYCERIN (NITROSTAT) 0.4 MG SL tablet Place 1 tablet (0.4 mg total) under the tongue every 5 (five) minutes as needed for chest pain. 25 tablet 4  . OVER THE COUNTER MEDICATION Take 1 tablet by mouth daily with lunch. [facility] Menopause support    . pantoprazole (PROTONIX) 40 MG tablet Take 1 tablet (40 mg total) by mouth daily at 6 (six) AM. 90 tablet 3  . ranitidine (ZANTAC) 150 MG tablet Take 150 mg by mouth daily as needed for  heartburn.     . simethicone (MYLICON) 80 MG chewable tablet Chew 160 mg by mouth every 6 (six) hours as needed for flatulence.     No current facility-administered medications for this visit.     Allergies:   Azithromycin; Ceclor [cefaclor]; Lipitor [atorvastatin]; Morphine and related; Penicillins; Septra [sulfamethoxazole-trimethoprim]; Simvastatin; Vicodin [hydrocodone-acetaminophen]; Zetia [ezetimibe]; Doxycycline; and Adhesive [tape]    Social History:  The patient  reports that she quit smoking about 3 years ago. Her smoking use included Cigarettes. She has a 20.00 pack-year smoking history. She has never used smokeless tobacco. She reports that she does not drink alcohol or use drugs.   Family History:  The patient's *family history includes Alcohol abuse in her father; Lupus in her mother; Migraines in her sister; Other in her sister; Pulmonary fibrosis in her brother.    ROS:  Please see the history of present illness.  Otherwise, review of systems are positive for chest spasms as noted above.   All other systems are reviewed and negative.    PHYSICAL EXAM: VS:  BP 106/80   Pulse 87   Ht 4' 11.5" (1.511 m)   Wt 135 lb (61.2 kg)   LMP 03/25/2011   SpO2 97%   BMI 26.81 kg/m  , BMI Body mass index is 26.81 kg/m. GEN: Well nourished, well developed, in no acute distress  HEENT: normal  Neck: no JVD, carotid bruits, or masses Cardiac: RRR; 3/6 systolic murmur, rubs, or gallops,no edema  Respiratory:  clear to auscultation bilaterally, normal work of breathing GI: soft, nontender, nondistended, + BS MS: no deformity or atrophy  Skin: warm and dry, no rash; 2+ right radial pulse Neuro:  Strength and sensation are intact Psych: euthymic mood, full affect   EKG:   The ekg ordered today demonstrates NSR, septal Q waves   Recent Labs: 01/03/2016: ALT 15 06/28/2016: BUN 15; Creatinine, Ser 0.72; Magnesium 2.1; Potassium 4.8; Sodium 140 06/30/2016: Hemoglobin 11.7; Platelets  266   Lipid Panel    Component Value Date/Time   CHOL 152 08/18/2015 0840   TRIG 105 08/18/2015 0840   HDL 65 08/18/2015 0840   CHOLHDL 2.3 08/18/2015 0840   VLDL 21 08/18/2015 0840   LDLCALC 66 08/18/2015 0840     Other studies Reviewed: Additional studies/ records that were reviewed today with results demonstrating: CathFindings as noted above.   ASSESSMENT AND PLAN:  1. CAD: P2Y12 testing showed Plavix was adequate antiplatelet therapy. Stents patent at 4/18 cath.  2. Hyperlipidemia: Statin intolerant, now on BiPAP. 3. Aortic stenosis: Mild to moderate in the past. No sx of severe AS.  Murmur stable.  Sx not correlating to the murmur intensity.  I suspect that the echocardiogram overestimated the severity of her aortic stenosis. Clinically, she is not having problems from the aortic stenosis. 4. Working on weight loss. Gained weight after stopping smoking. Try to increase walking to 150 minutes/week. Discussed the Mediterranean diet in the past. She has had success with a high fiber diet in the past to lose weight. She will try high fiber diet in the past.    Current medicines are reviewed at length with the patient today.  The patient concerns regarding her medicines were addressed.  The following changes have been made:    Labs/ tests ordered today include:   No orders of the defined types were placed in this encounter.   Recommend 150 minutes/week of aerobic exercise Low fat, low carb, high fiber diet recommended  Disposition:   FU in 1 year   Signed, Larae Grooms, MD  07/06/2016 2:24 PM    [facility] [address], [place], [place]  [address] Phone: [phone]; Fax: [phone]

## 2016-07-06 NOTE — Patient Instructions (Signed)

## 2016-07-17 ENCOUNTER — Telehealth: Payer: Self-pay | Admitting: Interventional Cardiology

## 2016-07-17 NOTE — Telephone Encounter (Signed)
Patient states that when she was discharged from the hospital on 4/28 that they changed her from prevacid to protonix. Patient states that the protonix is causing her to have diarrhea and wants to know if she can go back to taking the prevacid. I explained that the reason why they probably switched it was due to the interaction between the prevacid and the plavix. Patient is already taking zantac. Patient states that she has not taken the protonix in the last 2 days and her diarrhea is starting to improve. Patient wants to know if there is something else that she can take instead of the protonix.

## 2016-07-17 NOTE — Telephone Encounter (Signed)
New message       Pt c/o medication issue:  1. Name of Medication:  protonix 2. How are you currently taking this medication (dosage and times per day)? '40mg'$  3. Are you having a reaction (difficulty breathing--STAT)?  no 4. What is your medication issue?  Medication is making pt have diarrhea.  Can she go back on her prevacid?

## 2016-07-17 NOTE — Telephone Encounter (Signed)
Left message for patient to call back  

## 2016-07-17 NOTE — Telephone Encounter (Signed)
Follow Up:; ° ° °Returning your call. °

## 2016-07-17 NOTE — Telephone Encounter (Signed)
Since her stents are several years old, she can go back to prevacid.

## 2016-07-18 MED ORDER — LANSOPRAZOLE 30 MG PO CPDR: 30.0000 mg | DELAYED_RELEASE_CAPSULE | Freq: Every day | ORAL | 3 refills | Status: DC

## 2016-07-18 NOTE — Telephone Encounter (Signed)
Left a detailed message on patient's voicemail (DPR on file) to stop taking the protonix and to restart the prevacid. Instructed patient to call back with any questions.

## 2016-08-14 ENCOUNTER — Telehealth: Payer: Self-pay | Admitting: Interventional Cardiology

## 2016-08-14 NOTE — Telephone Encounter (Signed)
Teresa [name] is calling because she got a letter from [employer] , they are wanting to stop her Repatha and give her an alternative Praluent pen and they said for her to ask her doctor about the alternative . Please call

## 2016-08-17 NOTE — Telephone Encounter (Signed)
Finally able to speak to [employer] representative who states that on July 1st plan coverage change potentially (not set in stone?). AS of now the plan may change to prefer Praluent which would require another prior authorization, but they plan is not finalized and thus they cannot confirm this is the case until it is finalized on July 1st. Asked if we would be able to preemptively do PA so that patient would not go without medication and they state since a PA is already on file we would not be able to do so until July 1st.   Spoke with patient and she is doing well on Repatha and would like to remain on Repatha if at all possible. Advised that we should be able to get her approved but will need PA if plan changes in July. Advised they would likely send her another letter and she should call the office if she receives letter. She states understanding and appreciation.

## 2016-08-17 NOTE — Telephone Encounter (Signed)
Called [employer] and spoke to representative as we have approval for Repatha on file through 2020.

## 2016-08-28 ENCOUNTER — Ambulatory Visit: Payer: 59 | Admitting: Interventional Cardiology

## 2016-10-02 ENCOUNTER — Telehealth: Payer: Self-pay | Admitting: Interventional Cardiology

## 2016-10-02 NOTE — Telephone Encounter (Signed)
Returned call to pt - husband stated she is at work. LMOM for her to return call to clinic. Her husband was unsure if pt's insurance is requiring her to switch from [employer] to [employer].

## 2016-10-02 NOTE — Telephone Encounter (Signed)
New message   Pt c/o medication issue:  1. Name of Medication: Evolocumab (REPATHA SURECLICK) 590 MG/ML SOAJ  2. How are you currently taking this medication (dosage and times per day)? Inject 140 mg into the skin every 14 (fourteen) days.  3. Are you having a reaction (difficulty breathing--STAT)? No   4. What is your medication issue? Medication is not cover patient insurance.

## 2016-10-02 NOTE — Telephone Encounter (Signed)
Pt returned call - she states she has not heard from her insurance regarding the need to switch to Praluent. She states her Repatha is on auto refill. Advised her to continue with her Repatha and to let us know if her insurance tells her she needs to switch to Praluent so that we can help facilitate prior authorization.

## 2016-10-04 MED ORDER — ALIROCUMAB 150 MG/ML ~~LOC~~ SOPN: 150.0000 mg | PEN_INJECTOR | SUBCUTANEOUS | 11 refills | Status: DC

## 2016-10-04 NOTE — Telephone Encounter (Signed)
Pt called to report that insurance will require her to change to Praluent. Will send Rx for Praluent to [facility] as requested.

## 2016-10-04 NOTE — Addendum Note (Signed)
Addended by: Erskine Emery on: 10/04/2016 12:59 PM   Modules accepted: Orders

## 2016-11-26 ENCOUNTER — Telehealth: Payer: Self-pay | Admitting: Pharmacist

## 2016-11-26 NOTE — Telephone Encounter (Signed)
Pt called clinic with complaints of burning in her stomach and is wondering if the side effect is related to her Praluent (recently switched from [place] due to insurance preference). She has done 2 of the Praluent injections so far, one in her arm and one in her leg. She states the burning has occurred for over a week and does not match up with the timing of her injections. Advised her that side effect is likely unrelated to her Praluent. She is happy to continue with her injections and had just inquired if there could be a correlation. No further questions.

## 2016-12-28 ENCOUNTER — Telehealth: Payer: Self-pay | Admitting: Internal Medicine

## 2016-12-28 NOTE — Telephone Encounter (Signed)
Lvm advising appt moved from 11/7 to 11/12 at 3pm with [facility] due to md admin time.

## 2016-12-31 ENCOUNTER — Telehealth: Payer: Self-pay | Admitting: Oncology

## 2017-01-01 NOTE — Telephone Encounter (Signed)
Scheduled patient for appointments.

## 2017-01-02 ENCOUNTER — Encounter (HOSPITAL_COMMUNITY): Payer: Self-pay

## 2017-01-02 ENCOUNTER — Ambulatory Visit (HOSPITAL_COMMUNITY)
Admission: RE | Admit: 2017-01-02 | Discharge: 2017-01-02 | Disposition: A | Discharge status: Home / Self Care | Payer: Commercial Managed Care - PPO | Source: Ambulatory Visit | Attending: Internal Medicine | Admitting: Internal Medicine

## 2017-01-02 ENCOUNTER — Other Ambulatory Visit (HOSPITAL_BASED_OUTPATIENT_CLINIC_OR_DEPARTMENT_OTHER): Payer: Commercial Managed Care - PPO

## 2017-01-02 DIAGNOSIS — I700 Atherosclerosis of aorta: Secondary | ICD-10-CM | POA: Diagnosis not present

## 2017-01-02 DIAGNOSIS — J439 Emphysema, unspecified: Secondary | ICD-10-CM | POA: Diagnosis not present

## 2017-01-02 DIAGNOSIS — C3411 Malignant neoplasm of upper lobe, right bronchus or lung: Secondary | ICD-10-CM | POA: Insufficient documentation

## 2017-01-02 DIAGNOSIS — Z902 Acquired absence of lung [part of]: Secondary | ICD-10-CM | POA: Insufficient documentation

## 2017-01-02 DIAGNOSIS — I2510 Atherosclerotic heart disease of native coronary artery without angina pectoris: Secondary | ICD-10-CM | POA: Diagnosis not present

## 2017-01-02 LAB — CBC WITH DIFFERENTIAL/PLATELET
BASO%: 0.9 % (ref 0.0–2.0)
BASOS ABS: 0.1 10*3/uL (ref 0.0–0.1)
EOS%: 2.8 % (ref 0.0–7.0)
Eosinophils Absolute: 0.2 10*3/uL (ref 0.0–0.5)
HCT: 42 % (ref 34.8–46.6)
HGB: 13.9 g/dL (ref 11.6–15.9)
LYMPH%: 32.3 % (ref 14.0–49.7)
MCH: 29.6 pg (ref 25.1–34.0)
MCHC: 33.2 g/dL (ref 31.5–36.0)
MCV: 89.2 fL (ref 79.5–101.0)
MONO#: 0.5 10*3/uL (ref 0.1–0.9)
MONO%: 9.4 % (ref 0.0–14.0)
NEUT#: 3.1 10*3/uL (ref 1.5–6.5)
NEUT%: 54.6 % (ref 38.4–76.8)
Platelets: 264 10*3/uL (ref 145–400)
RBC: 4.71 10*6/uL (ref 3.70–5.45)
RDW: 13.8 % (ref 11.2–14.5)
WBC: 5.8 10*3/uL (ref 3.9–10.3)
lymph#: 1.9 10*3/uL (ref 0.9–3.3)

## 2017-01-02 LAB — COMPREHENSIVE METABOLIC PANEL
ALT: 13 U/L (ref 0–55)
AST: 16 U/L (ref 5–34)
Albumin: 4.1 g/dL (ref 3.5–5.0)
Alkaline Phosphatase: 82 U/L (ref 40–150)
Anion Gap: 8 mEq/L (ref 3–11)
BUN: 17.2 mg/dL (ref 7.0–26.0)
CO2: 29 meq/L (ref 22–29)
Calcium: 9.7 mg/dL (ref 8.4–10.4)
Chloride: 103 mEq/L (ref 98–109)
Creatinine: 0.7 mg/dL (ref 0.6–1.1)
EGFR: >60 mL/min/{1.73_m2} (ref >60)
Glucose: 81 mg/dl (ref 70–140)
POTASSIUM: 4.4 meq/L (ref 3.5–5.1)
SODIUM: 140 meq/L (ref 136–145)
Total Bilirubin: 0.76 mg/dL (ref 0.20–1.20)
Total Protein: 7.9 g/dL (ref 6.4–8.3)

## 2017-01-02 MED ORDER — IOPAMIDOL (ISOVUE-300) INJECTION 61%
INTRAVENOUS | Status: AC
  Filled 2017-01-02: qty 75

## 2017-01-02 MED ORDER — IOPAMIDOL (ISOVUE-300) INJECTION 61%
75.0000 mL | Freq: Once | INTRAVENOUS | Status: AC | PRN
  Administered 2017-01-02: 75 mL via INTRAVENOUS

## 2017-01-09 ENCOUNTER — Ambulatory Visit: Payer: 59 | Admitting: Internal Medicine

## 2017-01-14 ENCOUNTER — Ambulatory Visit: Payer: Commercial Managed Care - PPO | Admitting: Oncology

## 2017-02-21 ENCOUNTER — Ambulatory Visit (HOSPITAL_BASED_OUTPATIENT_CLINIC_OR_DEPARTMENT_OTHER): Payer: Commercial Managed Care - PPO | Admitting: Oncology

## 2017-02-21 ENCOUNTER — Telehealth: Payer: Self-pay | Admitting: Internal Medicine

## 2017-02-21 ENCOUNTER — Encounter: Payer: Self-pay | Admitting: Oncology

## 2017-02-21 VITALS — BP 143/52 | HR 77 | Temp 98.3°F | Resp 18 | Ht 59.5 in | Wt 140.0 lb

## 2017-02-21 DIAGNOSIS — C3411 Malignant neoplasm of upper lobe, right bronchus or lung: Secondary | ICD-10-CM | POA: Diagnosis not present

## 2017-02-21 NOTE — Progress Notes (Signed)
[facility] OFFICE PROGRESS NOTE  Leeroy Cha, MD [address] [place] [address]  DIAGNOSIS: Stage IB (T2a., N0, M0) poorly differentiated squamous cell carcinoma, diagnosed in December of 2014.  PRIOR THERAPY: Flexible video bronchoscopy, right upper lobectomy with mediastinal lymph node dissection under the care of Dr. Cyndia Bent.   CURRENT THERAPY: Observation  INTERVAL HISTORY: Teresa [name] 66 y.o. female returns for routine follow-up visit by herself.  The patient is feeling fine today with no specific complaints.  She denies fevers and chills.  Denies chest pain, shortness of breath, cough, hemoptysis.  Denies nausea, vomiting, constipation, diarrhea.  She has not had any recent weight loss or night sweats.  The patient is here to discuss her recent restaging CT scan results and lab work.  MEDICAL HISTORY: Past Medical History:  Diagnosis Date  . Aortic valve stenosis, moderate   . Arthritis    "across my hips; buttocks; comes w/the weather" (02/27/2013)  . CAD (coronary artery disease), native coronary artery   . Cataract    "just the beginnings on the right" (02/27/2013)  . Cellulitis 06/20/2012   "dog bite; right forearm" (06/24/2012)  . Chronic bronchitis ([place])    "used to have it alot; haven't had it in a long time" (02/27/2013)  . COPD (chronic obstructive pulmonary disease) ([place])    "little bit" (02/27/2013)  . Exertional shortness of breath    "sometimes" (02/27/2013)  . GERD (gastroesophageal reflux disease)   . Heart murmur   . Hypercholesterolemia   . Mass of lung    "small tumor RUL; they are watching it" (02/27/2013)  . Myocardial infarction [facility])    "think dr said I've had 2 silent one" (02/27/2013)  . Nonspecific abnormal unspecified cardiovascular function study   . Other and unspecified angina pectoris   . Peripheral vascular disease, unspecified ([place])   . Pneumonia 1990's   "once" (02/27/2013)  . Seasonal  allergies   . Tachycardia, unspecified   . Tobacco abuse     ALLERGIES:  is allergic to azithromycin; ceclor [cefaclor]; lipitor [atorvastatin]; morphine and related; penicillins; septra [sulfamethoxazole-trimethoprim]; simvastatin; vicodin [hydrocodone-acetaminophen]; zetia [ezetimibe]; doxycycline; and adhesive [tape].  MEDICATIONS:  Current Outpatient Medications  Medication Sig Dispense Refill  . albuterol (PROVENTIL HFA) 108 (90 BASE) MCG/ACT inhaler Inhale 2 puffs into the lungs 2 (two) times daily as needed for wheezing or shortness of breath.     . Alirocumab (PRALUENT) 150 MG/ML SOPN Inject 150 mg into the skin every 14 (fourteen) days. 2 pen 11  . cholecalciferol (VITAMIN D) 1000 UNITS tablet Take 1 tablet (1,000 Units total) by mouth at bedtime. 90 tablet 0  . clopidogrel (PLAVIX) 75 MG tablet Take 1 tablet (75 mg total) by mouth daily. 90 tablet 3  . lansoprazole (PREVACID) 30 MG capsule Take 1 capsule (30 mg total) by mouth daily at 12 noon. 90 capsule 3  . OVER THE COUNTER MEDICATION Take 1 tablet by mouth daily with lunch. [facility] Menopause support    . ranitidine (ZANTAC) 150 MG tablet Take 150 mg by mouth daily as needed for heartburn.     . simethicone (MYLICON) 80 MG chewable tablet Chew 160 mg by mouth every 6 (six) hours as needed for flatulence.    . fluticasone (FLONASE) 50 MCG/ACT nasal spray INHALE 2 SPRAYS IN EACH NOSTRIL AS NEEDED DAILY for allergies  4  . NASAL SALINE NA Place 1 spray into the nose daily as needed (congestion).     [name]  nitroGLYCERIN (NITROSTAT) 0.4 MG SL tablet Place 1 tablet (0.4 mg total) under the tongue every 5 (five) minutes as needed for chest pain. 25 tablet 4   No current facility-administered medications for this visit.     SURGICAL HISTORY:  Past Surgical History:  Procedure Laterality Date  . CORONARY ANGIOPLASTY WITH STENT PLACEMENT  10/2012; 12/08/2012   "7 + 3" (02/27/2013)  . ESOPHAGOGASTRODUODENOSCOPY (EGD) WITH PROPOFOL N/A  01/11/2015   Procedure: ESOPHAGOGASTRODUODENOSCOPY (EGD) WITH PROPOFOL;  Surgeon: Garlan Fair, MD;  Location: [facility];  Service: Endoscopy;  Laterality: N/A;  . FLEXIBLE BRONCHOSCOPY N/A 03/02/2013   Procedure: FLEXIBLE BRONCHOSCOPY;  Surgeon: Gaye Pollack, MD;  Location: [facility];  Service: Thoracic;  Laterality: N/A;  . FLEXIBLE SIGMOIDOSCOPY N/A 01/11/2015   Procedure: FLEXIBLE SIGMOIDOSCOPY;  Surgeon: Garlan Fair, MD;  Location: [facility];  Service: Endoscopy;  Laterality: N/A;  unable to complete colon-prep issues  . GANGLION CYST EXCISION Left 1975   "wrist"  . LEFT HEART CATH AND CORONARY ANGIOGRAPHY N/A 06/29/2016   Procedure: Left Heart Cath and Coronary Angiography;  Surgeon: Jettie Booze, MD;  Location: [place] [facility] CV LAB;  Service: Cardiovascular;  Laterality: N/A;  . LEFT HEART CATHETERIZATION WITH CORONARY ANGIOGRAM N/A 12/08/2012   Procedure: LEFT HEART CATHETERIZATION WITH CORONARY ANGIOGRAM;  Surgeon: Jettie Booze, MD;  Location: [facility] CATH LAB;  Service: Cardiovascular;  Laterality: N/A;  . MOUTH SURGERY  2010?   "for bone loss" (06/24/2012)  . PERCUTANEOUS CORONARY STENT INTERVENTION (PCI-S) N/A 10/09/2012   Procedure: PERCUTANEOUS CORONARY STENT INTERVENTION (PCI-S);  Surgeon: Jettie Booze, MD;  Location: [facility] CATH LAB;  Service: Cardiovascular;  Laterality: N/A;  . THORACOTOMY/LOBECTOMY Right 03/02/2013   Procedure: Right Video Assisted Thoracoscopy/Thoracotomy with upper Lobectomy;  Surgeon: Gaye Pollack, MD;  Location: [facility] OR;  Service: Thoracic;  Laterality: Right;  Right Lung Upper  Lobectomy   . TONSILLECTOMY  1960  . VIDEO BRONCHOSCOPY  09/12/2011   Procedure: VIDEO BRONCHOSCOPY WITH FLUORO;  Surgeon: Tanda Rockers, MD;  Location: [facility];  Service: Cardiopulmonary;  Laterality: Bilateral;    REVIEW OF SYSTEMS:   Review of Systems  Constitutional: Negative for appetite change, chills, fatigue, fever and unexpected weight change.   HENT:   Negative for mouth sores, nosebleeds, sore throat and trouble swallowing.   Eyes: Negative for eye problems and icterus.  Respiratory: Negative for cough, hemoptysis, shortness of breath and wheezing.   Cardiovascular: Negative for chest pain and leg swelling.  Gastrointestinal: Negative for abdominal pain, constipation, diarrhea, nausea and vomiting.  Genitourinary: Negative for bladder incontinence, difficulty urinating, dysuria, frequency and hematuria.   Musculoskeletal: Negative for back pain, gait problem, neck pain and neck stiffness.  Skin: Negative for itching and rash.  Neurological: Negative for dizziness, extremity weakness, gait problem, headaches, light-headedness and seizures.  Hematological: Negative for adenopathy. Does not bruise/bleed easily.  Psychiatric/Behavioral: Negative for confusion, depression and sleep disturbance. The patient is not nervous/anxious.     PHYSICAL EXAMINATION:  Blood pressure (!) 143/52, pulse 77, temperature 98.3 F (36.8 C), temperature source Oral, resp. rate 18, height 4' 11.5" (1.511 m), weight 140 lb (63.5 kg), last menstrual period 03/25/2011, SpO2 100 %.  ECOG PERFORMANCE STATUS: 1 - Symptomatic but completely ambulatory  Physical Exam  Constitutional: Oriented to person, place, and time and well-developed, well-nourished, and in no distress. No distress.  HENT:  Head: Normocephalic and atraumatic.  Mouth/Throat: Oropharynx is clear and moist. No oropharyngeal exudate.  Eyes: Conjunctivae are  normal. Right eye exhibits no discharge. Left eye exhibits no discharge. No scleral icterus.  Neck: Normal range of motion. Neck supple.  Cardiovascular: Normal rate, regular rhythm, normal heart sounds and intact distal pulses.   Pulmonary/Chest: Effort normal and breath sounds normal. No respiratory distress. No wheezes. No rales.  Abdominal: Soft. Bowel sounds are normal. Exhibits no distension and no mass. There is no tenderness.   Musculoskeletal: Normal range of motion. Exhibits no edema.  Lymphadenopathy:    No cervical adenopathy.  Neurological: Alert and oriented to person, place, and time. Exhibits normal muscle tone. Gait normal. Coordination normal.  Skin: Skin is warm and dry. No rash noted. Not diaphoretic. No erythema. No pallor.  Psychiatric: Mood, memory and judgment normal.  Vitals reviewed.  LABORATORY DATA: Lab Results  Component Value Date   WBC 5.8 01/02/2017   HGB 13.9 01/02/2017   HCT 42.0 01/02/2017   MCV 89.2 01/02/2017   PLT 264 01/02/2017      Chemistry      Component Value Date/Time   NA 140 01/02/2017 0737   K 4.4 01/02/2017 0737   CL 104 06/28/2016 1323   CO2 29 01/02/2017 0737   BUN 17.2 01/02/2017 0737   CREATININE 0.7 01/02/2017 0737      Component Value Date/Time   CALCIUM 9.7 01/02/2017 0737   ALKPHOS 82 01/02/2017 0737   AST 16 01/02/2017 0737   ALT 13 01/02/2017 0737   BILITOT 0.76 01/02/2017 0737       RADIOGRAPHIC STUDIES:  No results found.   ASSESSMENT/PLAN:  Lung cancer, upper lobe (HCC) this is a very pleasant 66 year old white female with history of a stage IB non-small cell lung cancer status post right upper lobectomy with lymph node dissection and has been observation with no evidence for disease recurrence.  The patient was seen with Dr. Julien Nordmann.  Lab results and scan were discussed with the patient today.  Her scan shows no evidence of disease recurrence.  Recommend continued observation with repeat CT scan of the chest at the end of October 2019 which will be 1 year from her prior CT scan.  She was advised to call immediately if she has any concerning symptoms in the interval. The patient voices understanding of current disease status and treatment options and is in agreement with the current care plan.  All questions were answered. The patient knows to call the clinic with any problems, questions or concerns. We can certainly see the patient  much sooner if necessary.  Orders Placed This Encounter  Procedures  . CT CHEST W CONTRAST    Standing Status:   Future    Standing Expiration Date:   02/21/2018    Order Specific Question:   If indicated for the ordered procedure, I authorize the administration of contrast media per Radiology protocol    Answer:   Yes    Order Specific Question:   Preferred imaging location?    Answer:   [facility]    Order Specific Question:   Radiology Contrast Protocol - do NOT remove file path    Answer:   [email or web address]    Order Specific Question:   Reason for Exam additional comments    Answer:   Lung cancer. Restaging.  [name] CBC with Differential/Platelet    Standing Status:   Future    Standing Expiration Date:   02/21/2018  . Comprehensive metabolic panel    Standing Status:   Future    Standing Expiration Date:  02/21/2018    Mikey Bussing, DNP, AGPCNP-BC, AOCNP 02/21/17  ADDENDUM: Hematology/Oncology Attending: I had a face-to-face encounter with the patient.  I recommended her care plan.  This is a very pleasant 66 years old white female with history of stage Ib non-small cell lung cancer status post right upper lobectomy with lymph node dissection and has been in observation since December 2014. The patient is doing fine today with no specific complaints.  She had a repeat CT scan of the chest that showed no evidence for disease recurrence.  I discussed the scan results with the patient and recommended for her to continue in observation with repeat CT scan of the chest in 1 year. She was advised to call immediately if she has any concerning symptoms in the interval.  Disclaimer: This note was dictated with voice recognition software. Similar sounding words can inadvertently be transcribed and may be missed upon review. Eilleen Kempf, MD 02/23/17

## 2017-02-21 NOTE — Assessment & Plan Note (Signed)
this is a very pleasant 66 year old white female with history of a stage IB non-small cell lung cancer status post right upper lobectomy with lymph node dissection and has been observation with no evidence for disease recurrence.  The patient was seen with Dr. Julien Nordmann.  Lab results and scan were discussed with the patient today.  Her scan shows no evidence of disease recurrence.  Recommend continued observation with repeat CT scan of the chest at the end of October 2019 which will be 1 year from her prior CT scan.  She was advised to call immediately if she has any concerning symptoms in the interval. The patient voices understanding of current disease status and treatment options and is in agreement with the current care plan.  All questions were answered. The patient knows to call the clinic with any problems, questions or concerns. We can certainly see the patient much sooner if necessary.

## 2017-02-21 NOTE — Telephone Encounter (Signed)
Gave avs and c

## 2017-02-21 NOTE — Telephone Encounter (Signed)
Gave avs and calendar for October and November

## 2017-05-21 ENCOUNTER — Telehealth: Payer: Self-pay | Admitting: Interventional Cardiology

## 2017-05-21 NOTE — Telephone Encounter (Signed)
Patient should have medication through May which is also when she is due for an appointment. I called her but did not get and answer and vm was unidentified.

## 2017-05-21 NOTE — Telephone Encounter (Signed)
New message     *STAT* If patient is at the pharmacy, call can be transferred to refill team.   1. Which medications need to be refilled? (please list name of each medication and dose if known) clopidogrel (PLAVIX) 75 MG tablet  2. Which pharmacy/location (including street and city if local pharmacy) is medication to be sent to?[facility], [place]  3. Do they need a 30 day or 90 day supply? [place]

## 2017-05-22 ENCOUNTER — Other Ambulatory Visit: Payer: Self-pay | Admitting: *Deleted

## 2017-05-22 MED ORDER — CLOPIDOGREL BISULFATE 75 MG PO TABS: 75.0000 mg | ORAL_TABLET | Freq: Every day | ORAL | 0 refills | Status: DC

## 2017-07-15 ENCOUNTER — Encounter: Payer: Self-pay | Admitting: Interventional Cardiology

## 2017-07-15 ENCOUNTER — Encounter (INDEPENDENT_AMBULATORY_CARE_PROVIDER_SITE_OTHER): Payer: Self-pay

## 2017-07-15 ENCOUNTER — Ambulatory Visit: Payer: Commercial Managed Care - PPO | Admitting: Interventional Cardiology

## 2017-07-15 VITALS — BP 160/98 | HR 71 | Ht 59.5 in | Wt 135.0 lb

## 2017-07-15 DIAGNOSIS — E782 Mixed hyperlipidemia: Secondary | ICD-10-CM

## 2017-07-15 DIAGNOSIS — J449 Chronic obstructive pulmonary disease, unspecified: Secondary | ICD-10-CM | POA: Diagnosis not present

## 2017-07-15 DIAGNOSIS — I350 Nonrheumatic aortic (valve) stenosis: Secondary | ICD-10-CM

## 2017-07-15 DIAGNOSIS — I25119 Atherosclerotic heart disease of native coronary artery with unspecified angina pectoris: Secondary | ICD-10-CM | POA: Diagnosis not present

## 2017-07-15 DIAGNOSIS — R030 Elevated blood-pressure reading, without diagnosis of hypertension: Secondary | ICD-10-CM | POA: Diagnosis not present

## 2017-07-15 NOTE — Patient Instructions (Signed)
Medication Instructions:  Your physician recommends that you continue on your current medications as directed. Please refer to the Current Medication list given to you today.   Labwork: None ordered  Testing/Procedures: None ordered  Follow-Up: Your physician wants you to follow-up in: 1 year with Dr. Irish Lack. You will receive a reminder letter in the mail two months in advance. If you don't receive a letter, please call our office to schedule the follow-up appointment.   Any Other Special Instructions Will Be Listed Below (If Applicable).  CALL IF YOUR BLOOD PRESSURE AT HOME IS CONSISTENTLY ELEVATED ABOVE 140/90   Low-Sodium Eating Plan Sodium, which is an element that makes up salt, helps you maintain a healthy balance of fluids in your body. Too much sodium can increase your blood pressure and cause fluid and waste to be held in your body. Your health care provider or dietitian may recommend following this plan if you have high blood pressure (hypertension), kidney disease, liver disease, or heart failure. Eating less sodium can help lower your blood pressure, reduce swelling, and protect your heart, liver, and kidneys. What are tips for following this plan? General guidelines  Most people on this plan should limit their sodium intake to 1,500-2,000 mg (milligrams) of sodium each day. Reading food labels  The Nutrition Facts label lists the amount of sodium in one serving of the food. If you eat more than one serving, you must multiply the listed amount of sodium by the number of servings.  Choose foods with less than 140 mg of sodium per serving.  Avoid foods with 300 mg of sodium or more per serving. Shopping  Look for lower-sodium products, often labeled as "low-sodium" or "no salt added."  Always check the sodium content even if foods are labeled as "unsalted" or "no salt added".  Buy fresh foods. ? Avoid canned foods and premade or frozen meals. ? Avoid canned,  cured, or processed meats  Buy breads that have less than 80 mg of sodium per slice. Cooking  Eat more home-cooked food and less restaurant, buffet, and fast food.  Avoid adding salt when cooking. Use salt-free seasonings or herbs instead of table salt or sea salt. Check with your health care provider or pharmacist before using salt substitutes.  Cook with plant-based oils, such as canola, sunflower, or olive oil. Meal planning  When eating at a restaurant, ask that your food be prepared with less salt or no salt, if possible.  Avoid foods that contain MSG (monosodium glutamate). MSG is sometimes added to [place] food, bouillon, and some canned foods. What foods are recommended? The items listed may not be a complete list. Talk with your dietitian about what dietary choices are best for you. Grains Low-sodium cereals, including oats, puffed wheat and rice, and shredded wheat. Low-sodium crackers. Unsalted rice. Unsalted pasta. Low-sodium bread. Whole-grain breads and whole-grain pasta. Vegetables Fresh or frozen vegetables. "No salt added" canned vegetables. "No salt added" tomato sauce and paste. Low-sodium or reduced-sodium tomato and vegetable juice. Fruits Fresh, frozen, or canned fruit. Fruit juice. Meats and other protein foods Fresh or frozen (no salt added) meat, poultry, seafood, and fish. Low-sodium canned tuna and salmon. Unsalted nuts. Dried peas, beans, and lentils without added salt. Unsalted canned beans. Eggs. Unsalted nut butters. Dairy Milk. Soy milk. Cheese that is naturally low in sodium, such as ricotta cheese, fresh mozzarella, or Swiss cheese Low-sodium or reduced-sodium cheese. Cream cheese. Yogurt. Fats and oils Unsalted butter. Unsalted margarine with no trans fat.  Vegetable oils such as canola or olive oils. Seasonings and other foods Fresh and dried herbs and spices. Salt-free seasonings. Low-sodium mustard and ketchup. Sodium-free salad dressing. Sodium-free  light mayonnaise. Fresh or refrigerated horseradish. Lemon juice. Vinegar. Homemade, reduced-sodium, or low-sodium soups. Unsalted popcorn and pretzels. Low-salt or salt-free chips. What foods are not recommended? The items listed may not be a complete list. Talk with your dietitian about what dietary choices are best for you. Grains Instant hot cereals. Bread stuffing, pancake, and biscuit mixes. Croutons. Seasoned rice or pasta mixes. Noodle soup cups. Boxed or frozen macaroni and cheese. Regular salted crackers. Self-rising flour. Vegetables Sauerkraut, pickled vegetables, and relishes. Olives. [place] fries. Onion rings. Regular canned vegetables (not low-sodium or reduced-sodium). Regular canned tomato sauce and paste (not low-sodium or reduced-sodium). Regular tomato and vegetable juice (not low-sodium or reduced-sodium). Frozen vegetables in sauces. Meats and other protein foods Meat or fish that is salted, canned, smoked, spiced, or pickled. Bacon, ham, sausage, hotdogs, corned beef, chipped beef, packaged lunch meats, salt pork, jerky, pickled herring, anchovies, regular canned tuna, sardines, salted nuts. Dairy Processed cheese and cheese spreads. Cheese curds. Blue cheese. Feta cheese. String cheese. Regular cottage cheese. Buttermilk. Canned milk. Fats and oils Salted butter. Regular margarine. Ghee. Bacon fat. Seasonings and other foods Onion salt, garlic salt, seasoned salt, table salt, and sea salt. Canned and packaged gravies. Worcestershire sauce. Tartar sauce. Barbecue sauce. Teriyaki sauce. Soy sauce, including reduced-sodium. Steak sauce. Fish sauce. Oyster sauce. Cocktail sauce. Horseradish that you find on the shelf. Regular ketchup and mustard. Meat flavorings and tenderizers. Bouillon cubes. Hot sauce and Tabasco sauce. Premade or packaged marinades. Premade or packaged taco seasonings. Relishes. Regular salad dressings. Salsa. Potato and tortilla chips. Corn chips and puffs.  Salted popcorn and pretzels. Canned or dried soups. Pizza. Frozen entrees and pot pies. Summary  Eating less sodium can help lower your blood pressure, reduce swelling, and protect your heart, liver, and kidneys.  Most people on this plan should limit their sodium intake to 1,500-2,000 mg (milligrams) of sodium each day.  Canned, boxed, and frozen foods are high in sodium. Restaurant foods, fast foods, and pizza are also very high in sodium. You also get sodium by adding salt to food.  Try to cook at home, eat more fresh fruits and vegetables, and eat less fast food, canned, processed, or prepared foods. This information is not intended to replace advice given to you by your health care provider. Make sure you discuss any questions you have with your health care provider. Document Released: 08/11/2001 Document Revised: 02/13/2016 Document Reviewed: 02/13/2016 Elsevier Interactive Patient Education  [employer].    If you need a refill on your cardiac medications before your next appointment, please call your pharmacy.

## 2017-07-15 NOTE — Progress Notes (Signed)
Cardiology Office Note   Date:  07/15/2017   ID:  Teresa [name], Teresa [name] May 17, 1950, MRN [id number]  PCP:  Leeroy Cha, MD    No chief complaint on file.  CAD  Wt Readings from Last 3 Encounters:  07/15/17 135 lb (61.2 kg)  02/21/17 140 lb (63.5 kg)  07/06/16 135 lb (61.2 kg)       History of Present Illness: Teresa [name] is a 67 y.o. female  who has had a complex left main , circumflex , LAD stenting after a dissection during PCI in 8/14. She had PCI of the RCA complicated by dissection and required 3 stents. She had successful lung surgery to remove a malignancy in 12/14.   Quit smoking in 2014.  She had chest discomfort concerning for unstable angina in April 2018. She underwent cardiac catheterization which showed widely patent stents. Her aortic stenosis was mild by cardiac cath in 06/2016, and her stents were patent. She subsequently underwent an echocardiogram. It was estimated that her aortic stenosis was severe.  She was doing well at the time and we opted for observation.  She has felt well.  Denies : Chest pain. Dizziness. Leg edema. Nitroglycerin use. Orthopnea. Palpitations. Paroxysmal nocturnal dyspnea. Shortness of breath. Syncope.   She remains active.  She was cancer free at last check with oncology.        Past Medical History:  Diagnosis Date  . Aortic valve stenosis, moderate   . Arthritis    "across my hips; buttocks; comes w/the weather" (02/27/2013)  . CAD (coronary artery disease), native coronary artery   . Cataract    "just the beginnings on the right" (02/27/2013)  . Cellulitis 06/20/2012   "dog bite; right forearm" (06/24/2012)  . Chronic bronchitis ([place])    "used to have it alot; haven't had it in a long time" (02/27/2013)  . COPD (chronic obstructive pulmonary disease) ([place])    "little bit" (02/27/2013)  . Exertional shortness of breath    "sometimes" (02/27/2013)  . GERD (gastroesophageal reflux disease)   . Heart  murmur   . Hypercholesterolemia   . Mass of lung    "small tumor RUL; they are watching it" (02/27/2013)  . Myocardial infarction [facility])    "think dr said I've had 2 silent one" (02/27/2013)  . Nonspecific abnormal unspecified cardiovascular function study   . Other and unspecified angina pectoris   . Peripheral vascular disease, unspecified ([place])   . Pneumonia 1990's   "once" (02/27/2013)  . Seasonal allergies   . Tachycardia, unspecified   . Tobacco abuse     Past Surgical History:  Procedure Laterality Date  . CORONARY ANGIOPLASTY WITH STENT PLACEMENT  10/2012; 12/08/2012   "7 + 3" (02/27/2013)  . ESOPHAGOGASTRODUODENOSCOPY (EGD) WITH PROPOFOL N/A 01/11/2015   Procedure: ESOPHAGOGASTRODUODENOSCOPY (EGD) WITH PROPOFOL;  Surgeon: Garlan Fair, MD;  Location: [facility] ENDOS[facility];  Service: Endoscopy;  Laterality: N/A;  . FLEXIBLE BRONCHOSCOPY N/A 03/02/2013   Procedure: FLEXIBLE BRONCHOSCOPY;  Surgeon: Gaye Pollack, MD;  Location: [place];  Service: Thoracic;  Laterality: N/A;  . FLEXIBLE SIGMOIDOSCOPY N/A 01/11/2015   Procedure: FLEXIBLE SIGMOIDOSCOPY;  Surgeon: Garlan Fair, MD;  Location: [facility];  Service: Endoscopy;  Laterality: N/A;  unable to complete colon-prep issues  . GANGLION CYST EXCISION Left 1975   "wrist"  . LEFT HEART CATH AND CORONARY ANGIOGRAPHY N/A 06/29/2016   Procedure: Left Heart Cath and Coronary Angiography;  Surgeon: Jettie Booze, MD;  Location: [place]  CV LAB;  Service: Cardiovascular;  Laterality: N/A;  . LEFT HEART CATHETERIZATION WITH CORONARY ANGIOGRAM N/A 12/08/2012   Procedure: LEFT HEART CATHETERIZATION WITH CORONARY ANGIOGRAM;  Surgeon: Jettie Booze, MD;  Location: [facility] CATH LAB;  Service: Cardiovascular;  Laterality: N/A;  . MOUTH SURGERY  2010?   "for bone loss" (06/24/2012)  . PERCUTANEOUS CORONARY STENT INTERVENTION (PCI-S) N/A 10/09/2012   Procedure: PERCUTANEOUS CORONARY STENT INTERVENTION (PCI-S);  Surgeon: Jettie Booze,  MD;  Location: [facility] CATH LAB;  Service: Cardiovascular;  Laterality: N/A;  . THORACOTOMY/LOBECTOMY Right 03/02/2013   Procedure: Right Video Assisted Thoracoscopy/Thoracotomy with upper Lobectomy;  Surgeon: Gaye Pollack, MD;  Location: [facility] OR;  Service: Thoracic;  Laterality: Right;  Right Lung Upper  Lobectomy   . TONSILLECTOMY  1960  . VIDEO BRONCHOSCOPY  09/12/2011   Procedure: VIDEO BRONCHOSCOPY WITH FLUORO;  Surgeon: Tanda Rockers, MD;  Location: [facility];  Service: Cardiopulmonary;  Laterality: Bilateral;     Current Outpatient Medications  Medication Sig Dispense Refill  . albuterol (PROVENTIL HFA) 108 (90 BASE) MCG/ACT inhaler Inhale 2 puffs into the lungs 2 (two) times daily as needed for wheezing or shortness of breath.     . Alirocumab (PRALUENT) 150 MG/ML SOPN Inject 150 mg into the skin every 14 (fourteen) days. 2 pen 11  . cholecalciferol (VITAMIN D) 1000 UNITS tablet Take 1 tablet (1,000 Units total) by mouth at bedtime. 90 tablet 0  . clopidogrel (PLAVIX) 75 MG tablet Take 1 tablet (75 mg total) by mouth daily. 90 tablet 0  . fluticasone (FLONASE) 50 MCG/ACT nasal spray INHALE 2 SPRAYS IN EACH NOSTRIL AS NEEDED DAILY for allergies  4  . lansoprazole (PREVACID) 30 MG capsule Take 1 capsule (30 mg total) by mouth daily at 12 noon. 90 capsule 3  . NASAL SALINE NA Place 1 spray into the nose daily as needed (congestion).     . nitroGLYCERIN (NITROSTAT) 0.4 MG SL tablet Place 1 tablet (0.4 mg total) under the tongue every 5 (five) minutes as needed for chest pain. 25 tablet 4  . OVER THE COUNTER MEDICATION Take 1 tablet by mouth daily with lunch. [facility] Men[facility] support    . ranitidine (ZANTAC) 150 MG tablet Take 150 mg by mouth daily as needed for heartburn.     . simethicone (MYLICON) 80 MG chewable tablet Chew 160 mg by mouth every 6 (six) hours as needed for flatulence.     No current facility-administered medications for this visit.     Allergies:   Azithromycin; Ceclor  [cefaclor]; Lipitor [atorvastatin]; Morphine and related; Penicillins; Septra [sulfamethoxazole-trimethoprim]; Simvastatin; Vicodin [hydrocodone-acetaminophen]; Zetia [ezetimibe]; Doxycycline; and Adhesive [tape]    Social History:  The patient  reports that she quit smoking about 4 years ago. Her smoking use included cigarettes. She has a 20.00 pack-year smoking history. She has never used smokeless tobacco. She reports that she does not drink alcohol or use drugs.   Family History:  The patient's family history includes Alcohol abuse in her father; Lupus in her mother; Migraines in her sister; Other in her sister; Pulmonary fibrosis in her brother.    ROS:  Please see the history of present illness.   Otherwise, review of systems are positive for occasional bronchoconstriciton sx.   All other systems are reviewed and negative.    PHYSICAL EXAM: VS:  BP (!) 160/98 (BP Location: Left Arm, Patient Position: Sitting, Cuff Size: Normal)   Pulse 71   Ht 4' 11.5" (1.511 m)  Wt 135 lb (61.2 kg)   LMP 03/25/2011   SpO2 96%   BMI 26.81 kg/m  , BMI Body mass index is 26.81 kg/m. GEN: Well nourished, well developed, in no acute distress  HEENT: normal  Neck: no JVD, carotid bruits, or masses Cardiac: RRR; no murmurs, rubs, or gallops,no edema  Respiratory:  clear to auscultation bilaterally, normal work of breathing GI: soft, nontender, nondistended, + BS MS: no deformity or atrophy  Skin: warm and dry, no rash Neuro:  Strength and sensation are intact Psych: euthymic mood, full affect   EKG:   The ekg ordered today demonstrates NSR, septal Q waves, nonspecific ST changes   Recent Labs: 01/02/2017: ALT 13; BUN 17.2; Creatinine 0.7; HGB 13.9; Platelets 264; Potassium 4.4; Sodium 140   Lipid Panel    Component Value Date/Time   CHOL 152 08/18/2015 0840   TRIG 105 08/18/2015 0840   HDL 65 08/18/2015 0840   CHOLHDL 2.3 08/18/2015 0840   VLDL 21 08/18/2015 0840   LDLCALC 66  08/18/2015 0840     Other studies Reviewed: Additional studies/ records that were reviewed today with results demonstrating: LDL 64 in 8/18.   ASSESSMENT AND PLAN:  1. CAD: No angina on medical therapy.   2. Aortic stenosis: Mild by cath.  No sx c/w severe AS.  No need for repeat echo at this time.  3. Hyperlipidemia: COntinue statin. LDL target 70.  Continue Praluent.   4. COPD: using inhaler.  Worse in the spring and fall.  5. Elevated BP reading.  Avoid salt.  Increase exercise.  Avoid fast food.  She will recheck BP at home.  Let us know if BP > 140/90.    Current medicines are reviewed at length with the patient today.  The patient concerns regarding her medicines were addressed.  The following changes have been made:  No change  Labs/ tests ordered today include:  No orders of the defined types were placed in this encounter.   Recommend 150 minutes/week of aerobic exercise Low fat, low carb, high fiber diet recommended  Disposition:   FU in 1 year   Signed, Larae Grooms, MD  07/15/2017 1:40 PM    [facility] [address], [place], [place]  [address] Phone: [phone]; Fax: [phone]

## 2017-07-31 ENCOUNTER — Telehealth: Payer: Self-pay | Admitting: Interventional Cardiology

## 2017-07-31 MED ORDER — ALIROCUMAB 150 MG/ML ~~LOC~~ SOPN: 150.0000 mg | PEN_INJECTOR | SUBCUTANEOUS | 11 refills | Status: DC

## 2017-07-31 NOTE — Telephone Encounter (Signed)
Per our records PA is good through 07/14/2018. Will call pt to see if hsa new insurance.   Spoke with husband who states that PA is not issue but that pt will no longer be able to fill from [facility]. She will need to use [facility]. Advised will send RX to [facility] and to call back if has any additional issues. He states understanding.

## 2017-07-31 NOTE — Telephone Encounter (Signed)
**Note De-Identified Amoni Scallan Obfuscation** I called the pts home phone number and s/w her husband, Iona Beard (DPR). I have advised Iona Beard that I did a PA for Plavix through covermymeds and received the following message:  Rivka Barbara Key: [id number]  Outcome:   Drug is covered by current benefit plan. No further PA activity needed.  I have asked Iona Beard to advise the pt that according to covermymeds a PA is not needed for Plavix/Clopidogrel at this time and that if she is advised differently by her pharmacist to call me back.  He verbalized understanding and thanked me for calling to let them know.

## 2017-07-31 NOTE — Telephone Encounter (Signed)
New Message     Patient is calling because she is receiving a call that she needs a prior authorization for Praluent and Plavix. The specialty pharmacy said they can be reached at  [phone]

## 2017-07-31 NOTE — Telephone Encounter (Signed)
I will address the Plavix PA and forward this message to Pharm D to address the Praluent PA.

## 2017-08-05 ENCOUNTER — Telehealth: Payer: Self-pay | Admitting: Interventional Cardiology

## 2017-08-05 NOTE — Telephone Encounter (Signed)
The pt's Praluent requires a cost exceeds PA. Will fax request because of specific instructions. Please call if any question

## 2017-08-07 NOTE — Telephone Encounter (Signed)
Spoke with representative through number provided on PA form and they state active prior auth on file through [employer] [facility], but can not see 'cost.' They advise that I call [facility] [employer] directly to verify coverage.   Spoke with [facility] who states that authorization is on file and needs a 'cost exceeds' and directs me back to first number called (she is able to transfer me this time). Still unclear exact need. Per our records we have never filled out a 'cost exceeds' form for this patient previously.   Spoke with Felicia with initial third party for cost exceeds - She state that they are a 'third party' and this is a new program to ensure costs. She states that I will need to print a form and fill it out to get prior authorization through them inaddition to having approval through [employer] [facility] she is unable to explain the relationship to me, but state that the patient will not be able to fill the medication without this authorization as well.   This form has been printed and faxed.

## 2017-08-19 NOTE — Telephone Encounter (Signed)
Pt cost exception has been approved. Pt notified.

## 2017-08-22 NOTE — Telephone Encounter (Signed)
Pt left message in clinic - [facility]uent 150mg /mL refill and was told that an authorization was still needed. She would like to pick up samples on Monday.  Returned call to pt and LMOM. We have samples available for her to pick up on Monday. Unsure what other authorization [facility] is requiring but advised pt we can follow up Monday when she stops by clinic.

## 2017-08-27 NOTE — Telephone Encounter (Signed)
Pt states she has received her Praluent shipment. Nothing further needed at this time.

## 2017-10-27 ENCOUNTER — Other Ambulatory Visit: Payer: Self-pay

## 2017-10-27 ENCOUNTER — Observation Stay (HOSPITAL_COMMUNITY)
Admission: EM | Admit: 2017-10-27 | Discharge: 2017-10-28 | Disposition: A | Discharge status: Home / Self Care | Payer: Commercial Managed Care - PPO | Attending: Internal Medicine | Admitting: Internal Medicine

## 2017-10-27 ENCOUNTER — Emergency Department (HOSPITAL_COMMUNITY): Payer: Commercial Managed Care - PPO

## 2017-10-27 ENCOUNTER — Other Ambulatory Visit: Payer: Self-pay | Admitting: Interventional Cardiology

## 2017-10-27 ENCOUNTER — Encounter (HOSPITAL_COMMUNITY): Payer: Self-pay

## 2017-10-27 DIAGNOSIS — C3410 Malignant neoplasm of upper lobe, unspecified bronchus or lung: Secondary | ICD-10-CM | POA: Diagnosis present

## 2017-10-27 DIAGNOSIS — Z85118 Personal history of other malignant neoplasm of bronchus and lung: Secondary | ICD-10-CM | POA: Diagnosis not present

## 2017-10-27 DIAGNOSIS — I739 Peripheral vascular disease, unspecified: Secondary | ICD-10-CM | POA: Insufficient documentation

## 2017-10-27 DIAGNOSIS — Z79899 Other long term (current) drug therapy: Secondary | ICD-10-CM | POA: Diagnosis not present

## 2017-10-27 DIAGNOSIS — Z7902 Long term (current) use of antithrombotics/antiplatelets: Secondary | ICD-10-CM | POA: Diagnosis not present

## 2017-10-27 DIAGNOSIS — Z87891 Personal history of nicotine dependence: Secondary | ICD-10-CM | POA: Insufficient documentation

## 2017-10-27 DIAGNOSIS — K219 Gastro-esophageal reflux disease without esophagitis: Secondary | ICD-10-CM | POA: Diagnosis not present

## 2017-10-27 DIAGNOSIS — I5023 Acute on chronic systolic (congestive) heart failure: Secondary | ICD-10-CM | POA: Diagnosis not present

## 2017-10-27 DIAGNOSIS — R0602 Shortness of breath: Secondary | ICD-10-CM | POA: Diagnosis present

## 2017-10-27 DIAGNOSIS — I350 Nonrheumatic aortic (valve) stenosis: Secondary | ICD-10-CM

## 2017-10-27 DIAGNOSIS — R0600 Dyspnea, unspecified: Secondary | ICD-10-CM | POA: Diagnosis present

## 2017-10-27 DIAGNOSIS — Z951 Presence of aortocoronary bypass graft: Secondary | ICD-10-CM | POA: Insufficient documentation

## 2017-10-27 DIAGNOSIS — J449 Chronic obstructive pulmonary disease, unspecified: Secondary | ICD-10-CM | POA: Insufficient documentation

## 2017-10-27 DIAGNOSIS — I252 Old myocardial infarction: Secondary | ICD-10-CM | POA: Insufficient documentation

## 2017-10-27 DIAGNOSIS — R0609 Other forms of dyspnea: Secondary | ICD-10-CM | POA: Diagnosis not present

## 2017-10-27 DIAGNOSIS — I2510 Atherosclerotic heart disease of native coronary artery without angina pectoris: Secondary | ICD-10-CM | POA: Diagnosis not present

## 2017-10-27 DIAGNOSIS — I509 Heart failure, unspecified: Secondary | ICD-10-CM

## 2017-10-27 LAB — BASIC METABOLIC PANEL
ANION GAP: 8 (ref 5–15)
BUN: 12 mg/dL (ref 8–23)
CALCIUM: 9.6 mg/dL (ref 8.9–10.3)
CO2: 28 mmol/L (ref 22–32)
Chloride: 103 mmol/L (ref 98–111)
Creatinine, Ser: 0.76 mg/dL (ref 0.44–1.00)
GLUCOSE: 122 mg/dL — AB (ref 70–99)
Potassium: 4.1 mmol/L (ref 3.5–5.1)
Sodium: 139 mmol/L (ref 135–145)

## 2017-10-27 LAB — CBC
HCT: 42.7 % (ref 36.0–46.0)
HEMOGLOBIN: 13.4 g/dL (ref 12.0–15.0)
MCH: 29.2 pg (ref 26.0–34.0)
MCHC: 31.4 g/dL (ref 30.0–36.0)
MCV: 93 fL (ref 78.0–100.0)
Platelets: 297 10*3/uL (ref 150–400)
RBC: 4.59 MIL/uL (ref 3.87–5.11)
RDW: 13.3 % (ref 11.5–15.5)
WBC: 6.9 10*3/uL (ref 4.0–10.5)

## 2017-10-27 LAB — I-STAT TROPONIN, ED: TROPONIN I, POC: 0.04 ng/mL (ref 0.00–0.08)

## 2017-10-27 LAB — BRAIN NATRIURETIC PEPTIDE: B Natriuretic Peptide: 503.8 pg/mL — ABNORMAL HIGH (ref 0.0–100.0)

## 2017-10-27 LAB — TSH: TSH: 1.576 u[IU]/mL (ref 0.350–4.500)

## 2017-10-27 LAB — TROPONIN I

## 2017-10-27 MED ORDER — IPRATROPIUM-ALBUTEROL 0.5-2.5 (3) MG/3ML IN SOLN: 3.0000 mL | Freq: Four times a day (QID) | RESPIRATORY_TRACT | Status: DC

## 2017-10-27 MED ORDER — FUROSEMIDE 10 MG/ML IJ SOLN
40.0000 mg | Freq: Once | INTRAMUSCULAR | Status: AC
  Administered 2017-10-27: 40 mg via INTRAVENOUS
  Filled 2017-10-27: qty 4

## 2017-10-27 MED ORDER — ALBUTEROL SULFATE HFA 108 (90 BASE) MCG/ACT IN AERS: 2.0000 | INHALATION_SPRAY | Freq: Two times a day (BID) | RESPIRATORY_TRACT | Status: DC | PRN

## 2017-10-27 MED ORDER — FAMOTIDINE 20 MG PO TABS
20.0000 mg | ORAL_TABLET | Freq: Two times a day (BID) | ORAL | Status: DC
  Administered 2017-10-27 – 2017-10-28 (×2): 20 mg via ORAL
  Filled 2017-10-27 (×2): qty 1

## 2017-10-27 MED ORDER — SIMETHICONE 80 MG PO CHEW: 160.0000 mg | CHEWABLE_TABLET | Freq: Four times a day (QID) | ORAL | Status: DC | PRN

## 2017-10-27 MED ORDER — VITAMIN D 1000 UNITS PO TABS: 1000.0000 [IU] | ORAL_TABLET | Freq: Every day | ORAL | Status: DC

## 2017-10-27 MED ORDER — IPRATROPIUM-ALBUTEROL 0.5-2.5 (3) MG/3ML IN SOLN
3.0000 mL | Freq: Three times a day (TID) | RESPIRATORY_TRACT | Status: DC
  Administered 2017-10-28: 3 mL via RESPIRATORY_TRACT
  Filled 2017-10-27: qty 3

## 2017-10-27 MED ORDER — NITROGLYCERIN 0.4 MG SL SUBL: 0.4000 mg | SUBLINGUAL_TABLET | SUBLINGUAL | Status: DC | PRN

## 2017-10-27 MED ORDER — IPRATROPIUM-ALBUTEROL 0.5-2.5 (3) MG/3ML IN SOLN: 3.0000 mL | RESPIRATORY_TRACT | Status: DC | PRN

## 2017-10-27 MED ORDER — ENOXAPARIN SODIUM 40 MG/0.4ML ~~LOC~~ SOLN
40.0000 mg | SUBCUTANEOUS | Status: DC
  Administered 2017-10-27: 40 mg via SUBCUTANEOUS
  Filled 2017-10-27: qty 0.4

## 2017-10-27 MED ORDER — CLOPIDOGREL BISULFATE 75 MG PO TABS
75.0000 mg | ORAL_TABLET | Freq: Every day | ORAL | Status: DC
  Administered 2017-10-28: 75 mg via ORAL
  Filled 2017-10-27: qty 1

## 2017-10-27 MED ORDER — PANTOPRAZOLE SODIUM 20 MG PO TBEC
20.0000 mg | DELAYED_RELEASE_TABLET | Freq: Every day | ORAL | Status: DC
  Administered 2017-10-28: 20 mg via ORAL
  Filled 2017-10-27: qty 1

## 2017-10-27 MED ORDER — ALBUTEROL SULFATE (2.5 MG/3ML) 0.083% IN NEBU: 2.5000 mg | INHALATION_SOLUTION | Freq: Two times a day (BID) | RESPIRATORY_TRACT | Status: DC | PRN

## 2017-10-27 MED ORDER — SODIUM CHLORIDE 0.9 % IV SOLN
INTRAVENOUS | Status: DC
  Administered 2017-10-27: 14:00:00 via INTRAVENOUS

## 2017-10-27 MED ORDER — IPRATROPIUM-ALBUTEROL 0.5-2.5 (3) MG/3ML IN SOLN
3.0000 mL | Freq: Four times a day (QID) | RESPIRATORY_TRACT | Status: DC
  Administered 2017-10-27: 3 mL via RESPIRATORY_TRACT
  Filled 2017-10-27: qty 3

## 2017-10-27 MED ORDER — FLUTICASONE PROPIONATE 50 MCG/ACT NA SUSP
2.0000 | Freq: Every day | NASAL | Status: DC
  Administered 2017-10-27: 2 via NASAL
  Filled 2017-10-27: qty 16

## 2017-10-27 NOTE — ED Triage Notes (Signed)
Pt presents for evaluation of SOB and mild chest pain. SOB worsened on Friday night.

## 2017-10-27 NOTE — ED Notes (Signed)
Pt transported to xray 

## 2017-10-27 NOTE — H&P (Signed)
History and Physical    Teresa [name] QIH:[id number] DOB: Jul 12, 1950 DOA: 10/27/2017  PCP: Leeroy Cha, MD  Patient coming from: Home  Chief Complaint: shortness of breath  HPI: Teresa [name] is a 67 y.o. female with medical history significant of mod-sev AS, PVD, CAD, GERD, COPD who presented for shortness of breath. She reports SOB on Friday of this past week which improved with albuterol.  She also had some chest pain with deep breathing.  She presented on Saturday to an urgent care and was told based on CXR and EKG results that she should go to the ED.  She presented to the ED this morning.  I do not see results in Care Everywhere.  Today, the patient reports absolutely no symptoms.  She has albuterol and a pulse ox at home.  She has some SOB with walking long distances.  She is a former smoker.  She denies dyspnea or chest pain at rest.  She has had 10 stents and is compliant with her plavix and praluent for cholesterol.  I discussed evaluating her for an anginal equivalent or returning home and she stated she would feel safer given her history if she was ruled out for an MI  ED Course: In the ED, she was found to have an elevated BNP (none to compare) and a CXR with some mild pulmonary edema.  She had one dose of lasix and is feeling good, but she reports no SOB when she came in anyway.  EKG showed possible infarct, but some baseline wandering and was difficult to interpret.  Initial troponin was 0.04.    Review of Systems: As per HPI otherwise 10 point review of systems negative.    Past Medical History:  Diagnosis Date  . Aortic valve stenosis, moderate   . Arthritis    "across my hips; buttocks; comes w/the weather" (02/27/2013)  . CAD (coronary artery disease), native coronary artery   . Cataract    "just the beginnings on the right" (02/27/2013)  . Cellulitis 06/20/2012   "dog bite; right forearm" (06/24/2012)  . Chronic bronchitis ([place])    "used to have it alot;  haven't had it in a long time" (02/27/2013)  . COPD (chronic obstructive pulmonary disease) ([place])    "little bit" (02/27/2013)  . Exertional shortness of breath    "sometimes" (02/27/2013)  . GERD (gastroesophageal reflux disease)   . Heart murmur   . Hypercholesterolemia   . Mass of lung    "small tumor RUL; they are watching it" (02/27/2013)  . Myocardial infarction [facility])    "think dr said I've had 2 silent one" (02/27/2013)  . Nonspecific abnormal unspecified cardiovascular function study   . Other and unspecified angina pectoris   . Peripheral vascular disease, unspecified ([place])   . Pneumonia 1990's   "once" (02/27/2013)  . Seasonal allergies   . Tachycardia, unspecified   . Tobacco abuse     Past Surgical History:  Procedure Laterality Date  . CORONARY ANGIOPLASTY WITH STENT PLACEMENT  10/2012; 12/08/2012   "7 + 3" (02/27/2013)  . ESOPHAGOGASTRODUODENOSCOPY (EGD) WITH PROPOFOL N/A 01/11/2015   Procedure: ESOPHAGOGASTRODUODENOSCOPY (EGD) WITH PROPOFOL;  Surgeon: Garlan Fair, MD;  [facility];  Service: Endoscopy;  Laterality: N/A;  . FLEXIBLE BRONCHOSCOPY N/A 03/02/2013   Procedure: FLEXIBLE BRONCHOSCOPY;  Surgeon: Gaye Pollack, MD;  Location: [place];  Service: Thoracic;  Laterality: N/A;  . FLEXIBLE SIGMOIDOSCOPY N/A 01/11/2015   Procedure: FLEXIBLE SIGMOIDOSCOPY;  Surgeon: Garlan Fair, MD;  [facility];  Service: Endoscopy;  Laterality: N/A;  unable to complete colon-prep issues  . GANGLION CYST EXCISION Left 1975   "wrist"  . LEFT HEART CATH AND CORONARY ANGIOGRAPHY N/A 06/29/2016   Procedure: Left Heart Cath and Coronary Angiography;  Surgeon: Jettie Booze, MD;  Location: [place] CV LAB;  Service: Cardiovascular;  Laterality: N/A;  . LEFT HEART CATHETERIZATION WITH CORONARY ANGIOGRAM N/A 12/08/2012   Procedure: LEFT HEART CATHETERIZATION WITH CORONARY ANGIOGRAM;  Surgeon: Jettie Booze, MD;  Location: [facility] CATH LAB;  Service:  Cardiovascular;  Laterality: N/A;  . MOUTH SURGERY  2010?   "for bone loss" (06/24/2012)  . PERCUTANEOUS CORONARY STENT INTERVENTION (PCI-S) N/A 10/09/2012   Procedure: PERCUTANEOUS CORONARY STENT INTERVENTION (PCI-S);  Surgeon: Jettie Booze, MD;  Location: [facility] CATH LAB;  Service: Cardiovascular;  Laterality: N/A;  . THORACOTOMY/LOBECTOMY Right 03/02/2013   Procedure: Right Video Assisted Thoracoscopy/Thoracotomy with upper Lobectomy;  Surgeon: Gaye Pollack, MD;  Location: [facility] OR;  Service: Thoracic;  Laterality: Right;  Right Lung Upper  Lobectomy   . TONSILLECTOMY  1960  . VIDEO BRONCHOSCOPY  09/12/2011   Procedure: VIDEO BRONCHOSCOPY WITH FLUORO;  Surgeon: Tanda Rockers, MD;  Location: [facility];  Service: Cardiopulmonary;  Laterality: Bilateral;   Reviewed with patient.   reports that she quit smoking about 5 years ago. Her smoking use included cigarettes. She has a 20.00 pack-year smoking history. She has never used smokeless tobacco. She reports that she does not drink alcohol or use drugs.  Allergies  Allergen Reactions  . Azithromycin Shortness Of Breath  . Ceclor [Cefaclor] Shortness Of Breath  . Lipitor [Atorvastatin] Other (See Comments)    Severe muscle aches  . Morphine And Related Other (See Comments)    Terrible headache  . Penicillins Other (See Comments)    Unknown from childhood  . Septra [Sulfamethoxazole-Trimethoprim] Shortness Of Breath and Rash  . Simvastatin Other (See Comments)    Severe muscle aches  . Vicodin [Hydrocodone-Acetaminophen] Nausea And Vomiting  . Zetia [Ezetimibe] Other (See Comments)    Severe stomach pain  . Doxycycline Swelling and Other (See Comments)    Redness on the face  . Adhesive [Tape] Other (See Comments)    Redness and swelling - use paper tape   Reviewed.  Family History  Problem Relation Age of Onset  . Alcohol abuse Father   . Lupus Mother   . Other Sister        Degenerative disc disease  . Migraines Sister   .  Pulmonary fibrosis Brother   . Heart attack Neg Hx     Prior to Admission medications   Medication Sig Start Date End Date Taking? Authorizing Provider  albuterol (PROVENTIL HFA) 108 (90 BASE) MCG/ACT inhaler Inhale 2 puffs into the lungs 2 (two) times daily as needed for wheezing or shortness of breath.    Yes [provider]  Alirocumab (PRALUENT) 150 MG/ML SOPN Inject 150 mg into the skin every 14 (fourteen) days. 07/31/17  Yes Jettie Booze, MD  cholecalciferol (VITAMIN D) 1000 UNITS tablet Take 1 tablet (1,000 Units total) by mouth at bedtime. 11/26/13  Yes Jettie Booze, MD  clopidogrel (PLAVIX) 75 MG tablet Take 1 tablet (75 mg total) by mouth daily. 05/22/17  Yes Jettie Booze, MD  fluticasone [facility]) 50 MCG/ACT nasal spray Place into both nostrils at bedtime.  09/23/14  Yes [provider]  Hypromellose (GENTEAL MILD) 0.2 % SOLN Place 2 drops into both  eyes as needed.   Yes [provider]  lansoprazole (PREVACID) 30 MG capsule Take 1 capsule (30 mg total) by mouth daily at 12 noon. 07/18/16  Yes Jettie Booze, MD  NASAL SALINE NA Place 1 spray into the nose daily as needed (congestion).    Yes [provider]  nitroGLYCERIN (NITROSTAT) 0.4 MG SL tablet Place 1 tablet (0.4 mg total) under the tongue every 5 (five) minutes as needed for chest pain. 06/30/16  Yes Meng, Isaac Laud, PA  OVER THE COUNTER MEDICATION Take 1 tablet by mouth daily with lunch. [facility] Menopause support   Yes [provider]  ranitidine (ZANTAC) 150 MG tablet Take 150 mg by mouth daily as needed for heartburn.    Yes [provider]  simethicone (MYLICON) 80 MG chewable tablet Chew 160 mg by mouth every 6 (six) hours as needed for flatulence.   Yes [provider]    Physical Exam: Vitals:   10/27/17 1330 10/27/17 1516 10/27/17 1517 10/27/17 1530  BP: (!) 166/83  (!) 140/104 137/66  Pulse: 93 89 88 87  Resp: 16 18 13 16   Temp:        TempSrc:      SpO2: 97% 95% 94% 93%    Constitutional: NAD, calm, comfortable Eyes:  lids and conjunctivae normal ENMT: Mucous membranes are moist.  Neck: normal, supple Respiratory: CTAB, no wheezing or rhonchi Cardiovascular: RR, NR, + systolic murmur which is 4/6 Abdomen: +BS, NT, ND Musculoskeletal: no clubbing, no edema Skin: no rashes, lesions, ulcers on exposed skin, + varicose veins in the lower extremities Neurologic: moves all extremities, grossly intact.  Psychiatric: Normal judgment and insight. Alert and oriented x 3. Normal mood.    Labs on Admission: I have personally reviewed following labs and imaging studies  CBC: Recent Labs  Lab 10/27/17 1218  WBC 6.9  HGB 13.4  HCT 42.7  MCV 93.0  PLT 397   Basic Metabolic Panel: Recent Labs  Lab 10/27/17 1218  NA 139  K 4.1  CL 103  CO2 28  GLUCOSE 122*  BUN 12  CREATININE 0.76  CALCIUM 9.6   GFR: CrCl cannot be calculated (Unknown ideal weight.). Liver Function Tests: No results for input(s): AST, ALT, ALKPHOS, BILITOT, PROT, ALBUMIN in the last 168 hours. No results for input(s): LIPASE, AMYLASE in the last 168 hours. No results for input(s): AMMONIA in the last 168 hours. Coagulation Profile: No results for input(s): INR, PROTIME in the last 168 hours. Cardiac Enzymes: No results for input(s): CKTOTAL, CKMB, CKMBINDEX, TROPONINI in the last 168 hours. BNP (last 3 results) No results for input(s): PROBNP in the last 8760 hours. HbA1C: No results for input(s): HGBA1C in the last 72 hours. CBG: No results for input(s): GLUCAP in the last 168 hours. Lipid Profile: No results for input(s): CHOL, HDL, LDLCALC, TRIG, CHOLHDL, LDLDIRECT in the last 72 hours. Thyroid Function Tests: No results for input(s): TSH, T4TOTAL, FREET4, T3FREE, THYROIDAB in the last 72 hours. Anemia Panel: No results for input(s): VITAMINB12, FOLATE, FERRITIN, TIBC, IRON, RETICCTPCT in the last 72 hours. Urine analysis:     Component Value Date/Time   COLORURINE YELLOW 03/01/2013 [place] 03/01/2013 1258   LABSPEC 1.023 03/01/2013 1258   PHURINE 6.5 03/01/2013 1258   GLUCOSEU NEGATIVE 03/01/2013 1258   HGBUR NEGATIVE 03/01/2013 1258   BILIRUBINUR NEGATIVE 03/01/2013 1258   KETONESUR NEGATIVE 03/01/2013 [place] 03/01/2013 1258   UROBILINOGEN 1.0 03/01/2013 1258  NITRITE NEGATIVE 03/01/2013 1258   LEUKOCYTESUR NEGATIVE 03/01/2013 1258    Radiological Exams on Admission: Dg Chest 2 View  Result Date: 10/27/2017 CLINICAL DATA:  Chest pain, shortness of breath. EXAM: CHEST - 2 VIEW COMPARISON:  Radiographs of October 26, 2017. FINDINGS: Stable cardiomediastinal silhouette. Atherosclerosis of thoracic aorta is noted. No pneumothorax or pleural effusion is noted. Mild central pulmonary vascular congestion is noted. Postsurgical changes are seen in the right perihilar and basilar region. No definite consolidative process is noted. Bony thorax is unremarkable. IMPRESSION: Mild central pulmonary vascular congestion and possible minimal pulmonary edema. Aortic Atherosclerosis (ICD10-I70.0). Electronically Signed   By: Marijo Conception, M.D.   On: 10/27/2017 12:58    EKG: Independently reviewed. Wandering baseline, difficult to interpret.  No obvious ST elevation  Assessment/Plan DOE with h/o Aortic stenosis and CAD - Patients symptoms have improved, and exam is very benign today.  She would prefer to be observed overnight given recommendation of urgent care and EDP - EKG for chest pain and in the AM - Trend troponin - Telemetry - Reviewed TTE from May which noted severe AS, but Dr. Irish Lack note stated Mild AS.  She has no chest pain, dizziness, syncope or dyspnea at rest, so I would anticipate this would be asymptomatic AS.  Given new BNP and possible CHF, would have her follow up with Cardiology in the near future, possible outpatient TTE.  - If worsens, repeat lasix dosing and  repeat TTE - Lasix X 1, improved, given AS, did not order scheduled lasix - Daily weight, I/O    COPD GOLD II - Symptoms seem to be more related to a mild worsening of COPD, given they improved with albuterol - Nebs q 6 hours - Nebs PRN - Oxygen PRN to keep sats 90-92% - Consider steroids if worsens    H/O Lung cancer, upper lobe - being monitored by oncology  PVD - Continue plavix  Reflux - continue H2 blocker and PPI     DVT prophylaxis: Lovenox Code Status: Full  Disposition Plan: Monitor overnight Consults called: None Admission status: Obs, telemetry  [name] Chiquito MD [facility] Pager [phone]  If 7PM-7AM, please contact night-coverage [email or web address] Password [facility]  10/27/2017, 3:57 PM

## 2017-10-27 NOTE — ED Provider Notes (Signed)
[place] EMERGENCY DEPARTMENT Provider Note   CSN: [id number] Arrival date & time: 10/27/17  1204     History   Chief Complaint Chief Complaint  Patient presents with  . Shortness of Breath    HPI Teresa [name] is a 67 y.o. female.  67 year old female prior history of coronary artery disease as well as aortic stenosis presents with increasing shortness of breath x2 days.  Went to urgent care yesterday and according to her had a negative chest x-ray but was told to seek cardiac follow-up.  She denies any anginal type chest pain.  No lower extremity edema.  No fever or cough.  Slight dyspnea on exertion but not too different from her baseline.  Does not take any diuretics chronically.  Symptoms better with remaining still.     Past Medical History:  Diagnosis Date  . Aortic valve stenosis, moderate   . Arthritis    "across my hips; buttocks; comes w/the weather" (02/27/2013)  . CAD (coronary artery disease), native coronary artery   . Cataract    "just the beginnings on the right" (02/27/2013)  . Cellulitis 06/20/2012   "dog bite; right forearm" (06/24/2012)  . Chronic bronchitis ([place])    "used to have it alot; haven't had it in a long time" (02/27/2013)  . COPD (chronic obstructive pulmonary disease) ([place])    "little bit" (02/27/2013)  . Exertional shortness of breath    "sometimes" (02/27/2013)  . GERD (gastroesophageal reflux disease)   . Heart murmur   . Hypercholesterolemia   . Mass of lung    "small tumor RUL; they are watching it" (02/27/2013)  . Myocardial infarction [facility])    "think dr said I've had 2 silent one" (02/27/2013)  . Nonspecific abnormal unspecified cardiovascular function study   . Other and unspecified angina pectoris   . Peripheral vascular disease, unspecified ([place])   . Pneumonia 1990's   "once" (02/27/2013)  . Seasonal allergies   . Tachycardia, unspecified   . Tobacco abuse     Patient Active Problem List   Diagnosis Date Noted  . Obesity 07/28/2013  . Lung cancer, upper lobe ([place]) 03/31/2013  . Tachycardia, unspecified   . Lung mass 02/27/2013  . Mixed hyperlipidemia 12/26/2012  . Coronary atherosclerosis of native coronary artery 12/26/2012  . Unstable angina ([place])   . CAD (coronary artery disease), native coronary artery   . Peripheral vascular disease, unspecified ([place])   . Nonspecific abnormal unspecified cardiovascular function study   . Cough 08/19/2012  . Solitary pulmonary nodule 06/29/2012  . Pneumonia 06/26/2012  . Dog bite of forearm 06/23/2012  . Cellulitis 06/23/2012  . COPD GOLD II 11/07/2011  . Smoker 11/07/2011  . Multiple pulmonary nodules 08/25/2011  . Aortic stenosis 08/25/2011    Past Surgical History:  Procedure Laterality Date  . CORONARY ANGIOPLASTY WITH STENT PLACEMENT  10/2012; 12/08/2012   "7 + 3" (02/27/2013)  . ESOPHAGOGASTRODUODENOSCOPY (EGD) WITH PROPOFOL N/A 01/11/2015   Procedure: ESOPHAGOGASTRODUODENOSCOPY (EGD) WITH PROPOFOL;  Surgeon: Garlan Fair, MD;  [facility];  Service: Endoscopy;  Laterality: N/A;  . FLEXIBLE BRONCHOSCOPY N/A 03/02/2013   Procedure: FLEXIBLE BRONCHOSCOPY;  Surgeon: Gaye Pollack, MD;  Location: [place];  Service: Thoracic;  Laterality: N/A;  . FLEXIBLE SIGMOIDOSCOPY N/A 01/11/2015   Procedure: FLEXIBLE SIGMOIDOSCOPY;  Surgeon: Garlan Fair, MD;  [facility];  Service: Endoscopy;  Laterality: N/A;  unable to complete colon-prep issues  . GANGLION CYST EXCISION Left 1975   "wrist"  .  LEFT HEART CATH AND CORONARY ANGIOGRAPHY N/A 06/29/2016   Procedure: Left Heart Cath and Coronary Angiography;  Surgeon: Jettie Booze, MD;  Location: [place] CV LAB;  Service: Cardiovascular;  Laterality: N/A;  . LEFT HEART CATHETERIZATION WITH CORONARY ANGIOGRAM N/A 12/08/2012   Procedure: LEFT HEART CATHETERIZATION WITH CORONARY ANGIOGRAM;  Surgeon: Jettie Booze, MD;  Location: [facility] CATH LAB;  Service:  Cardiovascular;  Laterality: N/A;  . MOUTH SURGERY  2010?   "for bone loss" (06/24/2012)  . PERCUTANEOUS CORONARY STENT INTERVENTION (PCI-S) N/A 10/09/2012   Procedure: PERCUTANEOUS CORONARY STENT INTERVENTION (PCI-S);  Surgeon: Jettie Booze, MD;  Location: [facility] CATH LAB;  Service: Cardiovascular;  Laterality: N/A;  . THORACOTOMY/LOBECTOMY Right 03/02/2013   Procedure: Right Video Assisted Thoracoscopy/Thoracotomy with upper Lobectomy;  Surgeon: Gaye Pollack, MD;  Location: [facility] OR;  Service: Thoracic;  Laterality: Right;  Right Lung Upper  Lobectomy   . TONSILLECTOMY  1960  . VIDEO BRONCHOSCOPY  09/12/2011   Procedure: VIDEO BRONCHOSCOPY WITH FLUORO;  Surgeon: Tanda Rockers, MD;  Location: [facility];  Service: Cardiopulmonary;  Laterality: Bilateral;     OB History   None      Home Medications    Prior to Admission medications   Medication Sig Start Date End Date Taking? Authorizing Provider  albuterol (PROVENTIL HFA) 108 (90 BASE) MCG/ACT inhaler Inhale 2 puffs into the lungs 2 (two) times daily as needed for wheezing or shortness of breath.     [provider]  Alirocumab (PRALUENT) 150 MG/ML SOPN Inject 150 mg into the skin every 14 (fourteen) days. 07/31/17   Jettie Booze, MD  cholecalciferol (VITAMIN D) 1000 UNITS tablet Take 1 tablet (1,000 Units total) by mouth at bedtime. 11/26/13   Jettie Booze, MD  clopidogrel (PLAVIX) 75 MG tablet Take 1 tablet (75 mg total) by mouth daily. 05/22/17   Jettie Booze, MD  fluticasone [facility]) 50 MCG/ACT nasal spray INHALE 2 SPRAYS IN EACH NOSTRIL AS NEEDED DAILY for allergies 09/23/14   [provider]  lansoprazole (PREVACID) 30 MG capsule Take 1 capsule (30 mg total) by mouth daily at 12 noon. 07/18/16   Jettie Booze, MD  NASAL SALINE NA Place 1 spray into the nose daily as needed (congestion).     [provider]  nitroGLYCERIN (NITROSTAT) 0.4 MG SL tablet Place 1 tablet (0.4 mg  total) under the tongue every 5 (five) minutes as needed for chest pain. 06/30/16   Almyra Deforest, PA  OVER THE COUNTER MEDICATION Take 1 tablet by mouth daily with lunch. [facility] Menopause support    [provider]  ranitidine (ZANTAC) 150 MG tablet Take 150 mg by mouth daily as needed for heartburn.     [provider]  simethicone (MYLICON) 80 MG chewable tablet Chew 160 mg by mouth every 6 (six) hours as needed for flatulence.    [provider]    Family History Family History  Problem Relation Age of Onset  . Alcohol abuse Father   . Lupus Mother   . Other Sister        Degenerative disc disease  . Migraines Sister   . Pulmonary fibrosis Brother   . Heart attack Neg Hx     Social History Social History   Tobacco Use  . Smoking status: Former Smoker    Packs/day: 0.50    Years: 40.00    Pack years: 20.00    Types: Cigarettes    Last attempt to  quit: 10/08/2012    Years since quitting: 5.0  . Smokeless tobacco: Never Used  Substance Use Topics  . Alcohol use: No  . Drug use: No     Allergies   Azithromycin; Ceclor [cefaclor]; Lipitor [atorvastatin]; Morphine and related; Penicillins; Septra [sulfamethoxazole-trimethoprim]; Simvastatin; Vicodin [hydrocodone-acetaminophen]; Zetia [ezetimibe]; Doxycycline; and Adhesive [tape]   Review of Systems Review of Systems  All other systems reviewed and are negative.    Physical Exam Updated Vital Signs BP (!) 176/70 (BP Location: Right Arm)   Pulse (!) 103   Temp 98.3 F (36.8 C) (Oral)   Resp 20   LMP 03/25/2011   SpO2 95%   Physical Exam  Constitutional: She is oriented to person, place, and time. She appears well-developed and well-nourished.  Non-toxic appearance. No distress.  HENT:  Head: Normocephalic and atraumatic.  Eyes: Pupils are equal, round, and reactive to light. Conjunctivae, EOM and lids are normal.  Neck: Normal range of motion. Neck supple. No tracheal deviation present. No  thyroid mass present.  Cardiovascular: Normal rate, regular rhythm and normal heart sounds. Exam reveals no gallop.  No murmur heard. Pulmonary/Chest: Effort normal. No stridor. No respiratory distress. She has no decreased breath sounds. She has no wheezes. She has no rhonchi. She has rales in the right lower field and the left lower field.  Abdominal: Soft. Normal appearance and bowel sounds are normal. She exhibits no distension. There is no tenderness. There is no rebound and no CVA tenderness.  Musculoskeletal: Normal range of motion. She exhibits no edema or tenderness.  Neurological: She is alert and oriented to person, place, and time. She has normal strength. No cranial nerve deficit or sensory deficit. GCS eye subscore is 4. GCS verbal subscore is 5. GCS motor subscore is 6.  Skin: Skin is warm and dry. No abrasion and no rash noted.  Psychiatric: She has a normal mood and affect. Her speech is normal and behavior is normal.  Nursing note and vitals reviewed.    ED Treatments / Results  Labs (all labs ordered are listed, but only abnormal results are displayed) Labs Reviewed  BASIC METABOLIC PANEL - Abnormal; Notable for the following components:      Result Value   Glucose, Bld 122 (*)    All other components within normal limits  CBC  BRAIN NATRIURETIC PEPTIDE  I-STAT TROPONIN, ED    EKG EKG Interpretation  Date/Time:  Sunday October 27 2017 12:16:09 EDT Ventricular Rate:  92 PR Interval:  144 QRS Duration: 80 QT Interval:  334 QTC Calculation: 413 R Axis:   67 Text Interpretation:  Normal sinus rhythm Anteroseptal infarct , age undetermined T wave abnormality, consider inferior ischemia Abnormal ECG Confirmed by Lacretia Leigh ([id number]) on 10/27/2017 12:48:05 PM   Radiology Dg Chest 2 View  Result Date: 10/27/2017 CLINICAL DATA:  Chest pain, shortness of breath. EXAM: CHEST - 2 VIEW COMPARISON:  Radiographs of October 26, 2017. FINDINGS: Stable cardiomediastinal  silhouette. Atherosclerosis of thoracic aorta is noted. No pneumothorax or pleural effusion is noted. Mild central pulmonary vascular congestion is noted. Postsurgical changes are seen in the right perihilar and basilar region. No definite consolidative process is noted. Bony thorax is unremarkable. IMPRESSION: Mild central pulmonary vascular congestion and possible minimal pulmonary edema. Aortic Atherosclerosis (ICD10-I70.0). Electronically Signed   By: Marijo Conception, M.D.   On: 10/27/2017 12:58    Procedures Procedures (including critical care time)  Medications Ordered in ED Medications - No data to display  Initial Impression / Assessment and Plan / ED Course  I have reviewed the triage vital signs and the nursing notes.  Pertinent labs & imaging results that were available during my care of the patient were reviewed by me and considered in my medical decision making (see chart for details).     Pt treated with lasix after dx of pulmonary edema Will be admitted to hospitalist service  Final Clinical Impressions(s) / ED Diagnoses   Final diagnoses:  None    ED Discharge Orders    None       Lacretia Leigh, MD 10/29/17 1536

## 2017-10-27 NOTE — ED Notes (Signed)
EDP at bedside. Zenia Resides

## 2017-10-27 NOTE — Progress Notes (Signed)
Pt admitted to 2W from ED. Pt on room air and denies SOB and CP at this time. VSS and assessments per flowsheets. Admission documentation completed. Pt oriented to room and unit. Family at bedside. All questions and needs met.

## 2017-10-28 DIAGNOSIS — I350 Nonrheumatic aortic (valve) stenosis: Secondary | ICD-10-CM | POA: Diagnosis not present

## 2017-10-28 DIAGNOSIS — I5023 Acute on chronic systolic (congestive) heart failure: Secondary | ICD-10-CM | POA: Diagnosis not present

## 2017-10-28 DIAGNOSIS — I2510 Atherosclerotic heart disease of native coronary artery without angina pectoris: Secondary | ICD-10-CM | POA: Diagnosis not present

## 2017-10-28 DIAGNOSIS — J449 Chronic obstructive pulmonary disease, unspecified: Secondary | ICD-10-CM | POA: Diagnosis not present

## 2017-10-28 LAB — BASIC METABOLIC PANEL
ANION GAP: 9 (ref 5–15)
BUN: 22 mg/dL (ref 8–23)
CALCIUM: 9.1 mg/dL (ref 8.9–10.3)
CO2: 28 mmol/L (ref 22–32)
CREATININE: 0.8 mg/dL (ref 0.44–1.00)
Chloride: 101 mmol/L (ref 98–111)
GFR calc non Af Amer: >60 mL/min (ref >60)
Glucose, Bld: 102 mg/dL — ABNORMAL HIGH (ref 70–99)
Potassium: 3.5 mmol/L (ref 3.5–5.1)
SODIUM: 138 mmol/L (ref 135–145)

## 2017-10-28 LAB — CBC
HEMATOCRIT: 41 % (ref 36.0–46.0)
Hemoglobin: 12.9 g/dL (ref 12.0–15.0)
MCH: 28.7 pg (ref 26.0–34.0)
MCHC: 31.5 g/dL (ref 30.0–36.0)
MCV: 91.3 fL (ref 78.0–100.0)
PLATELETS: 271 10*3/uL (ref 150–400)
RBC: 4.49 MIL/uL (ref 3.87–5.11)
RDW: 13.3 % (ref 11.5–15.5)
WBC: 5.1 10*3/uL (ref 4.0–10.5)

## 2017-10-28 LAB — TROPONIN I

## 2017-10-28 MED ORDER — POTASSIUM CHLORIDE CRYS ER 20 MEQ PO TBCR
40.0000 meq | EXTENDED_RELEASE_TABLET | Freq: Once | ORAL | Status: AC
  Administered 2017-10-28: 40 meq via ORAL
  Filled 2017-10-28: qty 2

## 2017-10-28 MED ORDER — FUROSEMIDE 20 MG PO TABS: 20.0000 mg | ORAL_TABLET | Freq: Every day | ORAL | 0 refills | Status: DC

## 2017-10-28 MED ORDER — POTASSIUM CHLORIDE CRYS ER 20 MEQ PO TBCR: 20.0000 meq | EXTENDED_RELEASE_TABLET | Freq: Every day | ORAL | 0 refills | Status: DC

## 2017-10-28 NOTE — Discharge Summary (Signed)
[facility]  Physician Discharge Summary   Patient ID: Teresa [name] MRN: [id number] DOB/AGE: [birth year] 67 y.o.  Admit date: 10/27/2017 Discharge date: 10/28/2017  PCP: Leeroy Cha, MD  DISCHARGE DIAGNOSES:  Mild pulmonary edema Acute on chronic systolic CHF  RECOMMENDATIONS FOR OUTPATIENT FOLLOW UP: 1. Message sent to cardiology to arrange outpatient follow-up and echocardiogram   DISCHARGE CONDITION: fair  Diet recommendation: As before  Filed Weights   10/27/17 1700 10/28/17 0500  Weight: 60.5 kg 58.6 kg    INITIAL HISTORY: 67 y.o. female with medical history significant of mod-sev AS, PVD, CAD, GERD, COPD who presented for shortness of breath. She reports SOB on Friday of this past week which improved with albuterol.  She also had some chest pain with deep breathing.  She presented on Saturday to an urgent care and was told based on CXR and EKG results that she should go to the ED.  She presented to the ED this morning.    Consultations:  None  Procedures:  None   HOSPITAL COURSE:   Mild acute systolic CHF with h/o Aortic stenosis and CAD Patient's x-ray showed mild pulmonary edema.  Patient was given Lasix with improvement in symptoms.  She was observed overnight.  She has ruled out for acute coronary syndrome by serial troponin.  She is followed by cardiology as an outpatient.  She is feeling much better this morning.  She has ambulated without any difficulties.  In view of her significant improvement she will be discharged today.  She can be followed closely by cardiology and can undergo cardiogram as an outpatient.  She will be discharged on low-dose Lasix and potassium.  She did mention that over the last few weeks she has been having food from outside which could have had high salt content.  This could have been the reason for her fluid overload.  As per cardiology note from me she has mild aortic stenosis.     COPD GOLD II Stable.   Continue home medications.  Unclear if she had COPD exacerbation considering her rapid improvement.    H/O Lung cancer, upper lobe Being monitored by oncology  PVD Continue plavix  Reflux Continue H2 blocker and PPI  Overall stable.  Okay for discharge home today.     PERTINENT LABS:  The results of significant diagnostics from this hospitalization (including imaging, microbiology, ancillary and laboratory) are listed below for reference.      Labs: Basic Metabolic Panel: Recent Labs  Lab 10/27/17 1218 10/28/17 0444  NA 139 138  K 4.1 3.5  CL 103 101  CO2 28 28  GLUCOSE 122* 102*  BUN 12 22  CREATININE 0.76 0.80  CALCIUM 9.6 9.1   CBC: Recent Labs  Lab 10/27/17 1218 10/28/17 0444  WBC 6.9 5.1  HGB 13.4 12.9  HCT 42.7 41.0  MCV 93.0 91.3  PLT 297 271   Cardiac Enzymes: Recent Labs  Lab 10/27/17 1646 10/27/17 2244 10/28/17 0444  TROPONINI <0.03 <0.03 <0.03   BNP: BNP (last 3 results) Recent Labs    10/27/17 1218  BNP 503.8*     IMAGING STUDIES Dg Chest 2 View  Result Date: 10/27/2017 CLINICAL DATA:  Chest pain, shortness of breath. EXAM: CHEST - 2 VIEW COMPARISON:  Radiographs of October 26, 2017. FINDINGS: Stable cardiomediastinal silhouette. Atherosclerosis of thoracic aorta is noted. No pneumothorax or pleural effusion is noted. Mild central pulmonary vascular congestion is noted. Postsurgical changes are seen in the right perihilar and basilar region. No  definite consolidative process is noted. Bony thorax is unremarkable. IMPRESSION: Mild central pulmonary vascular congestion and possible minimal pulmonary edema. Aortic Atherosclerosis (ICD10-I70.0). Electronically Signed   By: Marijo Conception, M.D.   On: 10/27/2017 12:58    DISCHARGE EXAMINATION: Vitals:   10/27/17 2210 10/28/17 0500 10/28/17 0750 10/28/17 0831  BP: (!) 143/62   (!) 154/67  Pulse: 77   (!) 101  Resp: 16   20  Temp: 98.8 F (37.1 C)   98.4 F (36.9 C)  TempSrc: Oral    Oral  SpO2: 95%  92% 96%  Weight:  58.6 kg    Height:       General appearance: alert, cooperative, appears stated age and no distress Resp: clear to auscultation bilaterally Cardio: regular rate and rhythm, S1, S2 normal, no murmur, click, rub or gallop Extremities: extremities normal, atraumatic, no cyanosis or edema  DISPOSITION: Home  Discharge Instructions    (HEART FAILURE PATIENTS) Call MD:  Anytime you have any of the following symptoms: 1) 3 pound weight gain in 24 hours or 5 pounds in 1 week 2) shortness of breath, with or without a dry hacking cough 3) swelling in the hands, feet or stomach 4) if you have to sleep on extra pillows at night in order to breathe.   Complete by:  As directed    Call MD for:  difficulty breathing, headache or visual disturbances   Complete by:  As directed    Call MD for:  extreme fatigue   Complete by:  As directed    Call MD for:  persistant dizziness or light-headedness   Complete by:  As directed    Call MD for:  persistant nausea and vomiting   Complete by:  As directed    Call MD for:  severe uncontrolled pain   Complete by:  As directed    Call MD for:  temperature >100.4   Complete by:  As directed    Diet - low sodium heart healthy   Complete by:  As directed    Discharge instructions   Complete by:  As directed    Message has been sent to your cardiology office to schedule appointment.  Take your medications as prescribed.  Seek attention immediately if symptoms recur.  You were cared for by a hospitalist during your hospital stay. If you have any questions about your discharge medications or the care you received while you were in the hospital after you are discharged, you can call the unit and asked to speak with the hospitalist on call if the hospitalist that took care of you is not available. Once you are discharged, your primary care physician will handle any further medical issues. Please note that NO REFILLS for any discharge  medications will be authorized once you are discharged, as it is imperative that you return to your primary care physician (or establish a relationship with a primary care physician if you do not have one) for your aftercare needs so that they can reassess your need for medications and monitor your lab values. If you do not have a primary care physician, you can call [phone] for a physician referral.   Increase activity slowly   Complete by:  As directed         Allergies as of 10/28/2017      Reactions   Azithromycin Shortness Of Breath   Ceclor [cefaclor] Shortness Of Breath   Lipitor [atorvastatin] Other (See Comments)   Severe muscle aches  Morphine And Related Other (See Comments)   Terrible headache   Penicillins Other (See Comments)   Unknown from childhood   Septra [sulfamethoxazole-trimethoprim] Shortness Of Breath, Rash   Simvastatin Other (See Comments)   Severe muscle aches   Vicodin [hydrocodone-acetaminophen] Nausea And Vomiting   Zetia [ezetimibe] Other (See Comments)   Severe stomach pain   Doxycycline Swelling, Other (See Comments)   Redness on the face   Adhesive [tape] Other (See Comments)   Redness and swelling - use paper tape      Medication List    TAKE these medications   Alirocumab 150 MG/ML Sopn Inject 150 mg into the skin every 14 (fourteen) days.   cholecalciferol 1000 units tablet Commonly known as:  VITAMIN D Take 1 tablet (1,000 Units total) by mouth at bedtime.   clopidogrel 75 MG tablet Commonly known as:  PLAVIX Take 1 tablet (75 mg total) by mouth daily.   fluticasone 50 MCG/ACT nasal spray Commonly known as:  FLONASE Place into both nostrils at bedtime.   furosemide 20 MG tablet Commonly known as:  LASIX Take 1 tablet (20 mg total) by mouth daily.   GENTEAL MILD 0.2 % Soln Generic drug:  Hypromellose Place 2 drops into both eyes as needed.   lansoprazole 30 MG capsule Commonly known as:  PREVACID Take 1 capsule (30 mg  total) by mouth daily at 12 noon.   NASAL SALINE NA Place 1 spray into the nose daily as needed (congestion).   nitroGLYCERIN 0.4 MG SL tablet Commonly known as:  NITROSTAT Place 1 tablet (0.4 mg total) under the tongue every 5 (five) minutes as needed for chest pain.   OVER THE COUNTER MEDICATION Take 1 tablet by mouth daily with lunch. [facility] Menopause support   potassium chloride SA 20 MEQ tablet Commonly known as:  K-DUR,KLOR-CON Take 1 tablet (20 mEq total) by mouth daily.   PROVENTIL HFA 108 (90 Base) MCG/ACT inhaler Generic drug:  albuterol Inhale 2 puffs into the lungs 2 (two) times daily as needed for wheezing or shortness of breath.   ranitidine 150 MG tablet Commonly known as:  ZANTAC Take 150 mg by mouth daily as needed for heartburn.   simethicone 80 MG chewable tablet Commonly known as:  MYLICON Chew 338 mg by mouth every 6 (six) hours as needed for flatulence.        Follow-up Information    Jettie Booze, MD Follow up.   Specialties:  Cardiology, Radiology, Interventional Cardiology Why:  His office should call you with an appointment Contact information: [address] [place] [address] [phone]           TOTAL DISCHARGE TIME: 35 mins  Bonnielee Haff  [facility] Pager [phone]  10/28/2017, 3:09 PM

## 2017-10-29 ENCOUNTER — Telehealth: Payer: Self-pay

## 2017-10-29 LAB — HIV ANTIBODY (ROUTINE TESTING W REFLEX): HIV Screen 4th Generation wRfx: NONREACTIVE

## 2017-10-29 NOTE — Telephone Encounter (Addendum)
Left message for patient to call back.  Need to schedule and echo. We will hold off on scheduling follow-up until after we receive echo results.

## 2017-10-29 NOTE — Telephone Encounter (Signed)
-----   Message from Jettie Booze, MD sent at 10/29/2017  8:47 AM EDT ----- Please schedule an echo for aortic stenosis  JV ----- Message ----- From: Bonnielee Haff, MD Sent: 10/28/2017  10:45 AM EDT To: Jettie Booze, MD, #  Hello,  Ms Asbill needs follow up within 1-2 weeks, with Dr. Irish Lack or with APP. She will need to undergo ECHO. Thanks

## 2017-10-30 ENCOUNTER — Other Ambulatory Visit: Payer: Self-pay

## 2017-10-30 ENCOUNTER — Telehealth: Payer: Self-pay | Admitting: Interventional Cardiology

## 2017-10-30 DIAGNOSIS — I350 Nonrheumatic aortic (valve) stenosis: Secondary | ICD-10-CM

## 2017-10-30 MED ORDER — CLOPIDOGREL BISULFATE 75 MG PO TABS: 75.0000 mg | ORAL_TABLET | Freq: Every day | ORAL | 2 refills | Status: DC

## 2017-10-30 NOTE — Telephone Encounter (Signed)
New Message    *STAT* If patient is at the pharmacy, call can be transferred to refill team.   1. Which medications need to be refilled? (please list name of each medication and dose if known) clopidogrel (PLAVIX) 75 MG tablet  2. Which pharmacy/location (including street and city if local pharmacy) is medication to be sent to?[id number] - [place], [place] - [address]. Do they need a 30 day or 90 day supply? 90 day

## 2017-10-30 NOTE — Telephone Encounter (Signed)
Pt's medication was sent to pt's pharmacy as requested. Confirmation received.  °

## 2017-10-31 NOTE — Telephone Encounter (Signed)
Echo scheduled for 9/3.

## 2017-11-01 ENCOUNTER — Other Ambulatory Visit (HOSPITAL_COMMUNITY): Payer: Commercial Managed Care - PPO

## 2017-11-01 ENCOUNTER — Encounter: Payer: Self-pay | Admitting: Physician Assistant

## 2017-11-04 ENCOUNTER — Encounter: Payer: Self-pay | Admitting: Physician Assistant

## 2017-11-04 NOTE — Progress Notes (Deleted)
Cardiology Office Note    Date:  11/04/2017  ID:  Teresa, [name] 20-Aug-1950, MRN [id number] PCP:  Leeroy Cha, MD  Cardiologist:  No primary care provider on file.   Chief Complaint: f/u CHF  History of Present Illness:  Teresa [name] is a 67 y.o. female with history of moderate-severe aortic stenosis, PVD, CAD, GERD, COPD, lung CA s/p surgery 2014, acid reflux, small AAA, LV dysfunction (EF 45% in 2014), former tobacco abuse who presents for post-hospital follow-up. In 09/2012 she had presented with chest pain and cath showed severe disease in LAD, mild disease in LCx, mild-moderate RCA disease, EF 45%. Discussion was made to proceed with PCI instead of bypass because she was undergoing w/u for lung CA as well. She came in for planned rotational atherectomy of the LAD but this was complicated by dissection of the LAD which spread back into the left main and circumflex. She had a prolonged procedure with 7 stents placed. She had an IABP during the procedure after an Impella would not cross a moderate stenosis in the distal aorta. She returned 12/2012 with chest pain and was found to have 80% mRCA treated with overlapping DES. Last cath 06/2016 with patent stents, mild disease in prox RCA, mild AS, EF 45-50% with normal LVEDP. 2D Echo 07/2016 showed EF 45-50%, grade 1 DD, severe AS. Given lack of symptoms at that time, she was feeling well so continued obs was preferred. She was recently admitted 10/2017 with pulmonary edema, improved with Lasix. She r/o for MI. OP w/u was recommended and Dr. Irish Lack recommended echocardiogram. Labs showed Cr 0.80, K 3.5, CBC wnl, TSH wnl, troponins neg, BNP 503, 12/2016 LFTs.  aaa duplex chf prec F/u echo  Chronic combined CHF Aortic stenosis CAD AAA  Past Medical History:  Diagnosis Date  . AAA (abdominal aortic aneurysm) ([place])    a. small by cath 2014.  . Arthritis    "across my hips; buttocks; comes w/the weather" (02/27/2013)  .  CAD (coronary artery disease), native coronary artery    a. 10/2012 - rotational atherectomy of LAD c/b dissection s/p PCI to LAD, LCx, LM. b. 12/2012 - s/p overlapping DES to RCA.  . Cataract    "just the beginnings on the right" (02/27/2013)  . Cellulitis 06/20/2012   "dog bite; right forearm" (06/24/2012)  . Chronic bronchitis ([place])    "used to have it alot; haven't had it in a long time" (02/27/2013)  . Chronic combined systolic and diastolic CHF (congestive heart failure) ([place])   . COPD (chronic obstructive pulmonary disease) ([place])    "little bit" (02/27/2013)  . GERD (gastroesophageal reflux disease)   . Hypercholesterolemia   . Ischemic cardiomyopathy    a. EF 45-50%.  . Mass of lung    "small tumor RUL; they are watching it" (02/27/2013)  . Myocardial infarction [facility])    "think dr said I've had 2 silent one" (02/27/2013)  . Peripheral vascular disease, unspecified ([place])   . Pneumonia 1990's   "once" (02/27/2013)  . Seasonal allergies   . Severe aortic stenosis   . Tachycardia, unspecified   . Tobacco abuse     Past Surgical History:  Procedure Laterality Date  . CORONARY ANGIOPLASTY WITH STENT PLACEMENT  10/2012; 12/08/2012   "7 + 3" (02/27/2013)  . ESOPHAGOGASTRODUODENOSCOPY (EGD) WITH PROPOFOL N/A 01/11/2015   Procedure: ESOPHAGOGASTRODUODENOSCOPY (EGD) WITH PROPOFOL;  Surgeon: Garlan Fair, MD;  Location: [facility];  Service: Endoscopy;  Laterality: N/A;  . FLEXIBLE  BRONCHOSCOPY N/A 03/02/2013   Procedure: FLEXIBLE BRONCHOSCOPY;  Surgeon: Gaye Pollack, MD;  Location: [place] [facility];  Service: Thoracic;  Laterality: N/A;  . FLEXIBLE SIGMOIDOSCOPY N/A 01/11/2015   Procedure: FLEXIBLE SIGMOIDOSCOPY;  Surgeon: Garlan Fair, MD;  Location: [facility];  Service: Endoscopy;  Laterality: N/A;  unable to complete colon-prep issues  . GANGLION CYST EXCISION Left 1975   "wrist"  . LEFT HEART CATH AND CORONARY ANGIOGRAPHY N/A 06/29/2016   Procedure: Left Heart Cath and Coronary  Angiography;  Surgeon: Jettie Booze, MD;  Location: [facility];  Service: Cardiovascular;  Laterality: N/A;  . LEFT HEART CATHETERIZATION WITH CORONARY ANGIOGRAM N/A 12/08/2012   Procedure: LEFT HEART CATHETERIZATION WITH CORONARY ANGIOGRAM;  Surgeon: Jettie Booze, MD;  Location: [facility] CATH LAB;  Service: Cardiovascular;  Laterality: N/A;  . MOUTH SURGERY  2010?   "for bone loss" (06/24/2012)  . PERCUTANEOUS CORONARY STENT INTERVENTION (PCI-S) N/A 10/09/2012   Procedure: PERCUTANEOUS CORONARY STENT INTERVENTION (PCI-S);  Surgeon: Jettie Booze, MD;  Location: [facility] CATH LAB;  Service: Cardiovascular;  Laterality: N/A;  . THORACOTOMY/LOBECTOMY Right 03/02/2013   Procedure: Right Video Assisted Thoracoscopy/Thoracotomy with upper Lobectomy;  Surgeon: Gaye Pollack, MD;  Location: [facility] OR;  Service: Thoracic;  Laterality: Right;  Right Lung Upper  Lobectomy   . TONSILLECTOMY  1960  . VIDEO BRONCHOSCOPY  09/12/2011   Procedure: VIDEO BRONCHOSCOPY WITH FLUORO;  Surgeon: Tanda Rockers, MD;  Location: [facility];  Service: Cardiopulmonary;  Laterality: Bilateral;    Current Medications: No outpatient medications have been marked as taking for the 11/05/17 encounter (Appointment) with Charlie Pitter, PA-C.   ***   Allergies:   Azithromycin; Ceclor [cefaclor]; Lipitor [atorvastatin]; Morphine and related; Penicillins; Septra [sulfamethoxazole-trimethoprim]; Simvastatin; Vicodin [hydrocodone-acetaminophen]; Zetia [ezetimibe]; Doxycycline; and Adhesive [tape]   Social History   Socioeconomic History  . Marital status: Married    Spouse name: Not on file  . Number of children: 1  . Years of education: Not on file  . Highest education level: Not on file  Occupational History  . 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 Financial resource strain: Not on file  . Food insecurity:    Worry: Not on file    Inability: Not on file  . Transportation needs:    Medical: Not on  file    Non-medical: Not on file  Tobacco Use  . Smoking status: Former Smoker    Packs/day: 0.50    Years: 40.00    Pack years: 20.00    Types: Cigarettes    Last attempt to quit: 10/08/2012    Years since quitting: 5.0  . Smokeless tobacco: Never Used  Substance and Sexual Activity  . Alcohol use: No  . Drug use: No  . Sexual activity: Yes  Lifestyle  . Physical activity:    Days per week: Not on file    Minutes per session: Not on file  . Stress: Not on file  Relationships  . Social connections:    Talks on phone: Not on file    Gets together: Not on file    Attends religious service: Not on file    Active member of club or organization: Not on file    Attends meetings of clubs or organizations: Not on file    Relationship status: Not on file  Other Topics Concern  . Not on file  Social History Narrative  . Not on file     Family History:  The patient's ***family history includes Alcohol abuse in her father; Lupus in her mother; Migraines in her sister; Other in her sister; Pulmonary fibrosis in her brother. There is no history of Heart attack.  ROS:   Please see the history of present illness. Otherwise, review of systems is positive for ***.  All other systems are reviewed and otherwise negative.    PHYSICAL EXAM:   VS:  LMP 03/25/2011   BMI: There is no height or weight on file to calculate BMI. GEN: Well nourished, well developed, in no acute distress HEENT: normocephalic, atraumatic Neck: no JVD, carotid bruits, or masses Cardiac: ***RRR; no murmurs, rubs, or gallops, no edema  Respiratory:  clear to auscultation bilaterally, normal work of breathing GI: soft, nontender, nondistended, + BS MS: no deformity or atrophy Skin: warm and dry, no rash Neuro:  Alert and Oriented x 3, Strength and sensation are intact, follows commands Psych: euthymic mood, full affect  Wt Readings from Last 3 Encounters:  10/28/17 129 lb 3.2 oz (58.6 kg)  07/15/17 135 lb (61.2  kg)  02/21/17 140 lb (63.5 kg)      Studies/Labs Reviewed:   EKG:  EKG was ordered today and personally reviewed by me and demonstrates *** EKG was not ordered today.***  Recent Labs: 01/02/2017: ALT 13 10/27/2017: B Natriuretic Peptide 503.8; TSH 1.576 10/28/2017: BUN 22; Creatinine, Ser 0.80; Hemoglobin 12.9; Platelets 271; Potassium 3.5; Sodium 138   Lipid Panel    Component Value Date/Time   CHOL 152 08/18/2015 0840   TRIG 105 08/18/2015 0840   HDL 65 08/18/2015 0840   CHOLHDL 2.3 08/18/2015 0840   VLDL 21 08/18/2015 0840   LDLCALC 66 08/18/2015 0840    Additional studies/ records that were reviewed today include: Summarized above.***    ASSESSMENT & PLAN:   1. ***  Disposition: F/u with ***   Medication Adjustments/Labs and Tests Ordered: Current medicines are reviewed at length with the patient today.  Concerns regarding medicines are outlined above. Medication changes, Labs and Tests ordered today are summarized above and listed in the Patient Instructions accessible in Encounters.   Signed, Charlie Pitter, PA-C  11/04/2017 11:37 AM    [address], [place], [place]  [address] Phone: [phone]; Fax: [phone]

## 2017-11-05 ENCOUNTER — Ambulatory Visit: Payer: Commercial Managed Care - PPO | Admitting: Physician Assistant

## 2017-11-05 ENCOUNTER — Telehealth: Payer: Self-pay | Admitting: Interventional Cardiology

## 2017-11-05 ENCOUNTER — Other Ambulatory Visit: Payer: Self-pay

## 2017-11-05 ENCOUNTER — Ambulatory Visit (HOSPITAL_COMMUNITY): Payer: Commercial Managed Care - PPO | Attending: Cardiovascular Disease

## 2017-11-05 DIAGNOSIS — R0600 Dyspnea, unspecified: Secondary | ICD-10-CM | POA: Diagnosis not present

## 2017-11-05 DIAGNOSIS — I080 Rheumatic disorders of both mitral and aortic valves: Secondary | ICD-10-CM | POA: Diagnosis not present

## 2017-11-05 DIAGNOSIS — I350 Nonrheumatic aortic (valve) stenosis: Secondary | ICD-10-CM | POA: Diagnosis not present

## 2017-11-05 DIAGNOSIS — C3490 Malignant neoplasm of unspecified part of unspecified bronchus or lung: Secondary | ICD-10-CM | POA: Diagnosis not present

## 2017-11-05 DIAGNOSIS — E785 Hyperlipidemia, unspecified: Secondary | ICD-10-CM | POA: Insufficient documentation

## 2017-11-05 DIAGNOSIS — I2510 Atherosclerotic heart disease of native coronary artery without angina pectoris: Secondary | ICD-10-CM | POA: Diagnosis not present

## 2017-11-05 DIAGNOSIS — J449 Chronic obstructive pulmonary disease, unspecified: Secondary | ICD-10-CM | POA: Insufficient documentation

## 2017-11-05 DIAGNOSIS — I739 Peripheral vascular disease, unspecified: Secondary | ICD-10-CM | POA: Diagnosis not present

## 2017-11-05 DIAGNOSIS — E669 Obesity, unspecified: Secondary | ICD-10-CM | POA: Insufficient documentation

## 2017-11-05 DIAGNOSIS — Z87891 Personal history of nicotine dependence: Secondary | ICD-10-CM | POA: Insufficient documentation

## 2017-11-05 DIAGNOSIS — R000 Tachycardia, unspecified: Secondary | ICD-10-CM | POA: Diagnosis not present

## 2017-11-05 MED ORDER — PERFLUTREN LIPID MICROSPHERE
1.0000 mL | INTRAVENOUS | Status: AC | PRN
  Administered 2017-11-05: 2 mL via INTRAVENOUS

## 2017-11-05 NOTE — Telephone Encounter (Signed)
Called and left a message for patient to call back .

## 2017-11-05 NOTE — Telephone Encounter (Signed)
° ° ° °*  STAT* If patient is at the pharmacy, call can be transferred to refill team.   1. Which medications need to be refilled? (please list name of each medication and dose if known) furosemide (LASIX) 20 MG tablet, potassium chloride SA (K-DUR,KLOR-CON) 20 MEQ tablet  2. Which pharmacy/location (including street and city if local pharmacy) is medication to be sent to?     [id number] - [place] [employer], [place] - [place]      3. Do they need a 30 day or 90 day supply? [place]

## 2017-11-05 NOTE — Telephone Encounter (Signed)
Pt calling requesting a refill on furosemide and potassium. These medications were prescribed in the hospital. Would Dr. Irish Lack like to refill these medications? Please address

## 2017-11-07 NOTE — Telephone Encounter (Signed)
Called and left message with husband for patient to call back.  Need to give echo results as well.

## 2017-11-08 MED ORDER — FUROSEMIDE 20 MG PO TABS: 20.0000 mg | ORAL_TABLET | Freq: Every day | ORAL | 1 refills | Status: DC

## 2017-11-08 MED ORDER — POTASSIUM CHLORIDE CRYS ER 20 MEQ PO TBCR: 20.0000 meq | EXTENDED_RELEASE_TABLET | Freq: Every day | ORAL | 1 refills | Status: DC

## 2017-11-08 NOTE — Telephone Encounter (Signed)
Returned call to patient. Patient made aware of her echo results. Patient was instructed to continue to avoid salt in her diet and continue her current medicines. Refills for her potassium and lasix were sent to preferred pharmacy. Patient denies having any chest pain, SOB, lightheadedness, dizziness, syncope, or any other Sx at this time. Patient has appointment on 10/1. Instructed patient to let us know if her Sx change or worsen before then. Patient verbalized understanding and thanked me for the call.

## 2017-11-08 NOTE — Telephone Encounter (Signed)
° °  Please call patient at [phone]

## 2017-11-19 ENCOUNTER — Other Ambulatory Visit: Payer: Self-pay | Admitting: Interventional Cardiology

## 2017-11-19 MED ORDER — CLOPIDOGREL BISULFATE 75 MG PO TABS: 75.0000 mg | ORAL_TABLET | Freq: Every day | ORAL | 2 refills | Status: DC

## 2017-12-03 ENCOUNTER — Encounter (INDEPENDENT_AMBULATORY_CARE_PROVIDER_SITE_OTHER): Payer: Self-pay

## 2017-12-03 ENCOUNTER — Ambulatory Visit: Payer: Commercial Managed Care - PPO | Admitting: Physician Assistant

## 2017-12-03 ENCOUNTER — Encounter: Payer: Self-pay | Admitting: Physician Assistant

## 2017-12-03 VITALS — BP 136/82 | HR 111 | Ht 59.0 in | Wt 127.4 lb

## 2017-12-03 DIAGNOSIS — I350 Nonrheumatic aortic (valve) stenosis: Secondary | ICD-10-CM | POA: Diagnosis not present

## 2017-12-03 DIAGNOSIS — R000 Tachycardia, unspecified: Secondary | ICD-10-CM | POA: Diagnosis not present

## 2017-12-03 DIAGNOSIS — I5032 Chronic diastolic (congestive) heart failure: Secondary | ICD-10-CM

## 2017-12-03 DIAGNOSIS — I25119 Atherosclerotic heart disease of native coronary artery with unspecified angina pectoris: Secondary | ICD-10-CM

## 2017-12-03 MED ORDER — NITROGLYCERIN 0.4 MG SL SUBL: 0.4000 mg | SUBLINGUAL_TABLET | SUBLINGUAL | 4 refills | Status: DC | PRN

## 2017-12-03 MED ORDER — DILTIAZEM HCL ER COATED BEADS 120 MG PO CP24: 120.0000 mg | ORAL_CAPSULE | Freq: Every day | ORAL | 3 refills | Status: DC

## 2017-12-03 NOTE — Patient Instructions (Addendum)
Medication Instructions:  Your physician has recommended you make the following change in your medication:   START: diltiazem 120 mg once a day  Labwork: TODAY: BMET  Testing/Procedures: None ordered  Follow-Up: Your physician recommends that you follow-up with Dr. Irish Lack on 12/23/17 at 3:00 PM    Any Other Special Instructions Will Be Listed Below (If Applicable).     If you need a refill on your cardiac medications before your next appointment, please call your pharmacy.

## 2017-12-03 NOTE — Progress Notes (Signed)
Cardiology Office Note    Date:  12/03/2017   ID:  [name], Teresa 08-10-1950, MRN [id number]  PCP:  Leeroy Cha, MD  Cardiologist: Larae Grooms, MD EPS: None  Chief Complaint  Patient presents with  . Hospitalization Follow-up    History of Present Illness:  Teresa [name] is a 67 y.o. female with CAD who has had a complex left main , circumflex , LAD stenting after a dissection during PCI in 8/14. She had PCI of the RCA complicated by dissection and required 3 stents.  Last cath 2018 patent stents in the left main, LAD circumflex and RCA mild aortic valve stenosis LVEF 45 to 50%.  She had successful lung surgery to remove a malignancy in 12/14.   Quit smoking in 2014.   Patient was hospitalized 10/2017 with CHF treated with IV Lasix.  Troponins were negative.  2D echo 11/05/2017 showed LVEF is 40 to 45% with grade 2 DD, severe aortic stenosis mean gradient of 45 mmHg although there was only mild aortic valve stenosis on cardiac cath 06/29/2016.  Patient comes in today accompanied by 3 other family members.  She was having chest tightness going down both arms associated with shortness of breath about a month ago.  She was started on steroids and albuterol for bronchitis.  She says it has gotten a little bit better but she still feels a fullness in her chest.   Past Medical History:  Diagnosis Date  . AAA (abdominal aortic aneurysm) ([place])    a. small by cath 2014.  . Arthritis    "across my hips; buttocks; comes w/the weather" (02/27/2013)  . CAD (coronary artery disease), native coronary artery    a. 10/2012 - rotational atherectomy of LAD c/b dissection s/p PCI to LAD, LCx, LM. b. 12/2012 - s/p overlapping DES to RCA.  . Cataract    "just the beginnings on the right" (02/27/2013)  . Cellulitis 06/20/2012   "dog bite; right forearm" (06/24/2012)  . Chronic bronchitis ([place])    "used to have it alot; haven't had it in a long time" (02/27/2013)  . Chronic  combined systolic and diastolic CHF (congestive heart failure) ([place])   . COPD (chronic obstructive pulmonary disease) ([place])    "little bit" (02/27/2013)  . GERD (gastroesophageal reflux disease)   . Hypercholesterolemia   . Ischemic cardiomyopathy    a. EF 45-50%.  . Mass of lung    "small tumor RUL; they are watching it" (02/27/2013)  . Myocardial infarction [facility])    "think dr said I've had 2 silent one" (02/27/2013)  . Peripheral vascular disease, unspecified ([place])   . Pneumonia 1990's   "once" (02/27/2013)  . Seasonal allergies   . Severe aortic stenosis   . Tachycardia, unspecified   . Tobacco abuse     Past Surgical History:  Procedure Laterality Date  . CORONARY ANGIOPLASTY WITH STENT PLACEMENT  10/2012; 12/08/2012   "7 + 3" (02/27/2013)  . ESOPHAGOGASTRODUODENOSCOPY (EGD) WITH PROPOFOL N/A 01/11/2015   Procedure: ESOPHAGOGASTRODUODENOSCOPY (EGD) WITH PROPOFOL;  Surgeon: Garlan Fair, MD;  Location: [facility] ENDOSCOPY;  Service: Endoscopy;  Laterality: N/A;  . FLEXIBLE BRONCHOSCOPY N/A 03/02/2013   Procedure: FLEXIBLE BRONCHOSCOPY;  Surgeon: Gaye Pollack, MD;  Location: [facility];  Service: Thoracic;  Laterality: N/A;  . FLEXIBLE SIGMOIDOSCOPY N/A 01/11/2015   Procedure: FLEXIBLE SIGMOIDOSCOPY;  Surgeon: Garlan Fair, MD;  Location: [facility];  Service: Endoscopy;  Laterality: N/A;  unable to complete colon-prep issues  . GANGLION  CYST EXCISION Left 1975   "wrist"  . LEFT HEART CATH AND CORONARY ANGIOGRAPHY N/A 06/29/2016   Procedure: Left Heart Cath and Coronary Angiography;  Surgeon: Jettie Booze, MD;  Location: [facility] CV LAB;  Service: Cardiovascular;  Laterality: N/A;  . LEFT HEART CATHETERIZATION WITH CORONARY ANGIOGRAM N/A 12/08/2012   Procedure: LEFT HEART CATHETERIZATION WITH CORONARY ANGIOGRAM;  Surgeon: Jettie Booze, MD;  Location: [facility] CATH LAB;  Service: Cardiovascular;  Laterality: N/A;  . MOUTH SURGERY  2010?   "for bone loss" (06/24/2012)  .  PERCUTANEOUS CORONARY STENT INTERVENTION (PCI-S) N/A 10/09/2012   Procedure: PERCUTANEOUS CORONARY STENT INTERVENTION (PCI-S);  Surgeon: Jettie Booze, MD;  Location: [facility] CATH LAB;  Service: Cardiovascular;  Laterality: N/A;  . THORACOTOMY/LOBECTOMY Right 03/02/2013   Procedure: Right Video Assisted Thoracoscopy/Thoracotomy with upper Lobectomy;  Surgeon: Gaye Pollack, MD;  Location[facility] OR;  Service: Thoracic;  Laterality: Right;  Right Lung Upper  Lobectomy   . TONSILLECTOMY  1960  . VIDEO BRONCHOSCOPY  09/12/2011   Procedure: VIDEO BRONCHOSCOPY WITH FLUORO;  Surgeon: Tanda Rockers, MD;  Location: [facility];  Service: Cardiopulmonary;  Laterality: Bilateral;    Current Medications: Current Meds  Medication Sig  . albuterol (PROVENTIL HFA) 108 (90 BASE) MCG/ACT inhaler Inhale 2 puffs into the lungs 2 (two) times daily as needed for wheezing or shortness of breath.   . Alirocumab (PRALUENT) 150 MG/ML SOPN Inject 150 mg into the skin every 14 (fourteen) days.  . cholecalciferol (VITAMIN D) 1000 UNITS tablet Take 1 tablet (1,000 Units total) by mouth at bedtime.  . clopidogrel (PLAVIX) 75 MG tablet Take 1 tablet (75 mg total) by mouth daily.  . fluticasone (FLONASE) 50 MCG/ACT nasal spray Place into both nostrils at bedtime.   . furosemide (LASIX) 20 MG tablet Take 1 tablet (20 mg total) by mouth daily.  . Hypromellose (GENTEAL MILD) 0.2 % SOLN Place 2 drops into both eyes as needed.  . lansoprazole (PREVACID) 30 MG capsule Take 1 capsule (30 mg total) by mouth daily at 12 noon.  [name] NASAL SALINE NA Place 1 spray into the nose daily as needed (congestion).   [name] OVER THE COUNTER MEDICATION Take 1 tablet by mouth daily with lunch. [facility] Menopause support  . potassium chloride SA (K-DUR,KLOR-CON) 20 MEQ tablet Take 1 tablet (20 mEq total) by mouth daily.  . ranitidine (ZANTAC) 150 MG tablet Take 150 mg by mouth daily as needed for heartburn.   . simethicone (MYLICON) 80 MG chewable tablet Chew  160 mg by mouth every 6 (six) hours as needed for flatulence.  . [DISCONTINUED] nitroGLYCERIN (NITROSTAT) 0.4 MG SL tablet Place 1 tablet (0.4 mg total) under the tongue every 5 (five) minutes as needed for chest pain.     Allergies:   Azithromycin; Ceclor [cefaclor]; Lipitor [atorvastatin]; Morphine and related; Penicillins; Septra [sulfamethoxazole-trimethoprim]; Simvastatin; Vicodin [hydrocodone-acetaminophen]; Zetia [ezetimibe]; Doxycycline; and Adhesive [tape]   Social History   Socioeconomic History  . Marital status: Married    Spouse name: Not on file  . Number of children: 1  . Years of education: Not on file  . Highest education level: Not on file  Occupational History  . 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 Financial resource strain: Not on file  . Food insecurity:    Worry: Not on file    Inability: Not on file  . Transportation needs:    Medical: Not on file    Non-medical: Not on file  Tobacco Use  . Smoking status: Former Smoker    Packs/day: 0.50    Years: 40.00    Pack years: 20.00    Types: Cigarettes    Last attempt to quit: 10/08/2012    Years since quitting: 5.1  . Smokeless tobacco: Never Used  Substance and Sexual Activity  . Alcohol use: No  . Drug use: No  . Sexual activity: Yes  Lifestyle  . Physical activity:    Days per week: Not on file    Minutes per session: Not on file  . Stress: Not on file  Relationships  . Social connections:    Talks on phone: Not on file    Gets together: Not on file    Attends religious service: Not on file    Active member of club or organization: Not on file    Attends meetings of clubs or organizations: Not on file    Relationship status: Not on file  Other Topics Concern  . Not on file  Social History Narrative  . Not on file     Family History:  The patient's family history includes Alcohol abuse in her father; Lupus in her mother; Migraines in her sister; Other in her sister; Pulmonary  fibrosis in her brother.   ROS:   Please see the history of present illness.    Review of Systems  Cardiovascular: Positive for chest pain and dyspnea on exertion.   All other systems reviewed and are negative.   PHYSICAL EXAM:   VS:  BP 136/82   Pulse (!) 111   Ht 4\' 11"  (1.499 m)   Wt 127 lb 6.4 oz (57.8 kg)   LMP 03/25/2011   SpO2 97%   BMI 25.73 kg/m   Physical Exam  GEN: Well nourished, well developed, in no acute distress  Neck: no JVD, carotid bruits, or masses Cardiac:RRR; 2/6 systolic murmur at the left sternal border Respiratory: Decreased breath sounds throughout GI: soft, nontender, nondistended, + BS Ext: without cyanosis, clubbing, or edema, Good distal pulses bilaterally Neuro:  Alert and Oriented x 3 Psych: euthymic mood, full affect  Wt Readings from Last 3 Encounters:  12/03/17 127 lb 6.4 oz (57.8 kg)  10/28/17 129 lb 3.2 oz (58.6 kg)  07/15/17 135 lb (61.2 kg)      Studies/Labs Reviewed:   EKG:  EKG is  ordered today.  The ekg ordered today demonstrates NSR with old ant infarct and TWI inf/lat unchanged from prior tracings.  Recent Labs: 01/02/2017: ALT 13 10/27/2017: B Natriuretic Peptide 503.8; TSH 1.576 10/28/2017: BUN 22; Creatinine, Ser 0.80; Hemoglobin 12.9; Platelets 271; Potassium 3.5; Sodium 138   Lipid Panel    Component Value Date/Time   CHOL 152 08/18/2015 0840   TRIG 105 08/18/2015 0840   HDL 65 08/18/2015 0840   CHOLHDL 2.3 08/18/2015 0840   VLDL 21 08/18/2015 0840   LDLCALC 66 08/18/2015 0840    Additional studies/ records that were reviewed today include:   2D echo 11/05/2017 Study Conclusions   - Procedure narrative: Transthoracic echocardiography. Image   quality was adequate. Intravenous contrast (Definity) was   administered. - Left ventricle: The cavity size was at the upper limits of   normal. Systolic function was mildly to moderately reduced. The   estimated ejection fraction was in the range of 40% to 45%.    Akinesis and scarring of the apicalanteroseptal, anterior, and   apical myocardium; consistent with infarction in the distribution   of the left anterior descending  coronary artery. Features are   consistent with a pseudonormal left ventricular filling pattern,   with concomitant abnormal relaxation and increased filling   pressure (grade 2 diastolic dysfunction). Acoustic contrast   opacification revealed no evidence ofthrombus. - Aortic valve: There was severe stenosis. Mean gradient (S): 45 mm   Hg. Peak gradient (S): 74 mm Hg. - Mitral valve: Calcified annulus. There was mild to moderate   regurgitation directed centrally. - Left atrium: The atrium was mildly dilated. - Atrial septum: No defect or patent foramen ovale was identified. - Pericardium, extracardiac: A trivial pericardial effusion was   identified.  Cardiac catheterization 06/29/2016  Patent stents in the left main, LAD, circumflex, and RCA.  Mild disease in the proximal RCA.  There is mild aortic valve stenosis.  The left ventricular ejection fraction is 45-50% by visual estimate.  LV end diastolic pressure is normal.  There is mild left ventricular systolic dysfunction wth persistent apical hypokinesis.   Continue aggressive secondary prevention. She can likely be discharged home tomorrow.  Consider GI related causes of her chest discomfort.     ASSESSMENT:    1. Coronary artery disease involving native coronary artery of native heart with angina pectoris ([place])   2. Aortic valve stenosis, etiology of cardiac valve disease unspecified   3. Chronic diastolic CHF (congestive heart failure) ([employer])   4. Tachycardia, unspecified      PLAN:  In order of problems listed above:  CAD status post complex left main, circumflex and LAD stenting after dissection during PCI RCA in 2014.  Follow-up cath 2018 patent stents.  Patient has had recent chest tightness down her arms and was treated for bronchitis with steroids  and inhalers.  Patient still having some residual fullness in her chest and was tachycardic when she walked in here.  Will begin low-dose diltiazem to see if this helps.  She is not on any antianginals.  Follow-up with Dr. Irish Lack in a couple weeks.  Aortic valve stenosis severe on echo but cath last year showed it to be mild.  Chronic diastolic CHF with acute exacerbation repeat requiring overnight hospitalization most likely due to increased dietary sodium.  2D echo LVEF 40 to 45% on recent echo with grade 2 DD.  Continue low-dose Lasix, potassium and 2 g sodium diet.  Check renal function today.  Tachycardia most likely secondary to albuterol and steroids.  Will begin low-dose diltiazem    Medication Adjustments/Labs and Tests Ordered: Current medicines are reviewed at length with the patient today.  Concerns regarding medicines are outlined above.  Medication changes, Labs and Tests ordered today are listed in the Patient Instructions below. Patient Instructions  Medication Instructions:  Your physician has recommended you make the following change in your medication:   START: diltiazem 120 mg once a day  Labwork: TODAY: BMET  Testing/Procedures: None ordered  Follow-Up: Your physician recommends that you follow-up with Dr. Irish Lack on 12/23/17 at 3:00 PM    Any Other Special Instructions Will Be Listed Below (If Applicable).     If you need a refill on your cardiac medications before your next appointment, please call your pharmacy.      Signed, Ermalinda Barrios, PA-C  12/03/2017 1:04 PM    [facility] [place], [place], [place]  [address] Phone: [phone]; Fax: [phone]

## 2017-12-04 ENCOUNTER — Telehealth: Payer: Self-pay | Admitting: Physician Assistant

## 2017-12-04 DIAGNOSIS — E875 Hyperkalemia: Secondary | ICD-10-CM

## 2017-12-04 LAB — BASIC METABOLIC PANEL
BUN / CREAT RATIO: 30 — AB (ref 12–28)
BUN: 25 mg/dL (ref 8–27)
CHLORIDE: 94 mmol/L — AB (ref 96–106)
CO2: 28 mmol/L (ref 20–29)
Calcium: 9.7 mg/dL (ref 8.7–10.3)
Creatinine, Ser: 0.84 mg/dL (ref 0.57–1.00)
GFR, EST AFRICAN AMERICAN: 85 mL/min/{1.73_m2} (ref >59)
GFR, EST NON AFRICAN AMERICAN: 74 mL/min/{1.73_m2} (ref >59)
Glucose: 124 mg/dL — ABNORMAL HIGH (ref 65–99)
POTASSIUM: 5.5 mmol/L — AB (ref 3.5–5.2)
Sodium: 142 mmol/L (ref 134–144)

## 2017-12-04 NOTE — Telephone Encounter (Signed)
Follow-up  Re-routed to [place].

## 2017-12-04 NOTE — Telephone Encounter (Signed)
Called and made patient aware of lab results. Instructed patient to hold potassium and come in on Friday for repeat BMET. Patient verbalized understanding.

## 2017-12-04 NOTE — Telephone Encounter (Signed)
New message   Patient is returning call for lab work results.

## 2017-12-04 NOTE — Telephone Encounter (Signed)
-----  Message from Imogene Burn, PA-C sent at 12/04/2017  7:58 AM EDT ----- Potassium high at 5.5.  Please hold her potassium and recheck be met on Friday

## 2017-12-06 ENCOUNTER — Other Ambulatory Visit: Payer: Commercial Managed Care - PPO | Admitting: *Deleted

## 2017-12-06 DIAGNOSIS — E875 Hyperkalemia: Secondary | ICD-10-CM

## 2017-12-06 NOTE — Telephone Encounter (Signed)
Follow-up    Patient wants to when to start back taking the potassium. Please advise.

## 2017-12-06 NOTE — Telephone Encounter (Signed)
Returned call to patient. Patient asking when she should restart her potassium. Made patient aware that since she did not come until this afternoon for her repeat BMET that the results are not in and therefore I cannot see what her potassium is. Instructed the patient to continue to hold her potassium and I will contact her first thing Monday morning. Patient verbalized understanding and thanked me for the call.

## 2017-12-07 LAB — BASIC METABOLIC PANEL
BUN/Creatinine Ratio: 37 — ABNORMAL HIGH (ref 12–28)
BUN: 22 mg/dL (ref 8–27)
CO2: 29 mmol/L (ref 20–29)
CREATININE: 0.59 mg/dL (ref 0.57–1.00)
Calcium: 9.6 mg/dL (ref 8.7–10.3)
Chloride: 94 mmol/L — ABNORMAL LOW (ref 96–106)
GFR calc Af Amer: 112 mL/min/{1.73_m2} (ref >59)
GFR, EST NON AFRICAN AMERICAN: 97 mL/min/{1.73_m2} (ref >59)
GLUCOSE: 98 mg/dL (ref 65–99)
Potassium: 4.1 mmol/L (ref 3.5–5.2)
SODIUM: 140 mmol/L (ref 134–144)

## 2017-12-10 ENCOUNTER — Telehealth: Payer: Self-pay | Admitting: Physician Assistant

## 2017-12-10 MED ORDER — POTASSIUM CHLORIDE CRYS ER 20 MEQ PO TBCR: 10.0000 meq | EXTENDED_RELEASE_TABLET | Freq: Every day | ORAL | 1 refills | Status: DC

## 2017-12-10 NOTE — Telephone Encounter (Signed)
Called and made patient aware of results and recommendations to decrease potassium to 10 mEq QD and repeat BMET in 2 weeks. Patient verbalized understanding. Repeat BMET will be drawn at [place] appointment with [place] on 10/21.

## 2017-12-10 NOTE — Telephone Encounter (Signed)
-----   Message from Imogene Burn, PA-C sent at 12/09/2017  7:54 AM EDT ----- Potassium back to normal. Would decrease potassium to 10 meq daily and repeat bmet in 2 weeks

## 2017-12-10 NOTE — Telephone Encounter (Signed)
New Message   Patient is calling reference to lab results and whether or not should resume potassium. Please call to discuss.

## 2017-12-16 ENCOUNTER — Emergency Department (HOSPITAL_COMMUNITY)
Admission: EM | Admit: 2017-12-16 | Discharge: 2017-12-16 | Disposition: A | Discharge status: Home / Self Care | Payer: Commercial Managed Care - PPO | Attending: Emergency Medicine | Admitting: Emergency Medicine

## 2017-12-16 ENCOUNTER — Encounter (HOSPITAL_COMMUNITY): Payer: Self-pay | Admitting: *Deleted

## 2017-12-16 ENCOUNTER — Other Ambulatory Visit: Payer: Self-pay

## 2017-12-16 ENCOUNTER — Emergency Department (HOSPITAL_COMMUNITY): Payer: Commercial Managed Care - PPO

## 2017-12-16 DIAGNOSIS — Z87891 Personal history of nicotine dependence: Secondary | ICD-10-CM | POA: Diagnosis not present

## 2017-12-16 DIAGNOSIS — Z79899 Other long term (current) drug therapy: Secondary | ICD-10-CM | POA: Insufficient documentation

## 2017-12-16 DIAGNOSIS — J9801 Acute bronchospasm: Secondary | ICD-10-CM | POA: Diagnosis not present

## 2017-12-16 DIAGNOSIS — I2510 Atherosclerotic heart disease of native coronary artery without angina pectoris: Secondary | ICD-10-CM | POA: Insufficient documentation

## 2017-12-16 DIAGNOSIS — I252 Old myocardial infarction: Secondary | ICD-10-CM | POA: Diagnosis not present

## 2017-12-16 DIAGNOSIS — E7800 Pure hypercholesterolemia, unspecified: Secondary | ICD-10-CM | POA: Insufficient documentation

## 2017-12-16 DIAGNOSIS — E782 Mixed hyperlipidemia: Secondary | ICD-10-CM | POA: Insufficient documentation

## 2017-12-16 DIAGNOSIS — Z7902 Long term (current) use of antithrombotics/antiplatelets: Secondary | ICD-10-CM | POA: Insufficient documentation

## 2017-12-16 DIAGNOSIS — I5042 Chronic combined systolic (congestive) and diastolic (congestive) heart failure: Secondary | ICD-10-CM | POA: Insufficient documentation

## 2017-12-16 DIAGNOSIS — R0602 Shortness of breath: Secondary | ICD-10-CM | POA: Diagnosis present

## 2017-12-16 DIAGNOSIS — I255 Ischemic cardiomyopathy: Secondary | ICD-10-CM | POA: Insufficient documentation

## 2017-12-16 LAB — CBC
HEMATOCRIT: 40.3 % (ref 36.0–46.0)
HEMOGLOBIN: 12.2 g/dL (ref 12.0–15.0)
MCH: 27.9 pg (ref 26.0–34.0)
MCHC: 30.3 g/dL (ref 30.0–36.0)
MCV: 92 fL (ref 80.0–100.0)
NRBC: 0 % (ref 0.0–0.2)
Platelets: 331 10*3/uL (ref 150–400)
RBC: 4.38 MIL/uL (ref 3.87–5.11)
RDW: 13 % (ref 11.5–15.5)
WBC: 7.4 10*3/uL (ref 4.0–10.5)

## 2017-12-16 LAB — BASIC METABOLIC PANEL
Anion gap: 8 (ref 5–15)
BUN: 12 mg/dL (ref 8–23)
CHLORIDE: 101 mmol/L (ref 98–111)
CO2: 28 mmol/L (ref 22–32)
Calcium: 9.1 mg/dL (ref 8.9–10.3)
Creatinine, Ser: 0.68 mg/dL (ref 0.44–1.00)
GFR calc non Af Amer: >60 mL/min (ref >60)
Glucose, Bld: 120 mg/dL — ABNORMAL HIGH (ref 70–99)
POTASSIUM: 4.1 mmol/L (ref 3.5–5.1)
Sodium: 137 mmol/L (ref 135–145)

## 2017-12-16 LAB — I-STAT TROPONIN, ED: Troponin i, poc: 0.06 ng/mL (ref 0.00–0.08)

## 2017-12-16 MED ORDER — PREDNISONE 20 MG PO TABS
40.0000 mg | ORAL_TABLET | Freq: Once | ORAL | Status: AC
  Administered 2017-12-16: 40 mg via ORAL
  Filled 2017-12-16: qty 2

## 2017-12-16 MED ORDER — PREDNISONE 10 MG PO TABS: 20.0000 mg | ORAL_TABLET | Freq: Every day | ORAL | 0 refills | Status: AC

## 2017-12-16 NOTE — Discharge Instructions (Addendum)
Return for any problem.  Follow-up with your cardiologist at 8 AM as previously scheduled.  Please follow-up with your regular doctor as instructed.

## 2017-12-16 NOTE — ED Notes (Signed)
No answer when called for vitals x2

## 2017-12-16 NOTE — ED Triage Notes (Signed)
Pt in c/o shortness of breath since Saturday, sent by pcp for further evaluation, no distress noted

## 2017-12-16 NOTE — Progress Notes (Signed)
Cardiology Office Note   Date:  12/17/2017   ID:  Teresa [name], Teresa [name] Aug 21, 1950, MRN [id number]  PCP:  Leeroy Cha, MD    No chief complaint on file.  CAD  Wt Readings from Last 3 Encounters:  12/17/17 125 lb (56.7 kg)  12/03/17 127 lb 6.4 oz (57.8 kg)  10/28/17 129 lb 3.2 oz (58.6 kg)       History of Present Illness: Teresa [name] is a 67 y.o. female  with CAD who has had a complex left main , circumflex , LAD stenting after a dissection during PCI in 8/14. She had PCI of the RCA complicated by dissection and required 3 stents.  Last cath 2018 patent stents in the left main, LAD circumflex and RCA mild aortic valve stenosis LVEF 45 to 50%.  She had successful lung surgery to remove a malignancy in 12/14.Quit smoking in 2014.   Patient was hospitalized 10/2017 with CHF treated with IV Lasix.  Troponins were negative.  2D echo 11/05/2017 showed LVEF is 40 to 45% with grade 2 DD, severe aortic stenosis mean gradient of 45 mmHg although there was only mild aortic valve stenosis on cardiac cath 06/29/2016.  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  She was started on steroids and albuterol for bronchitis.  Diltiazem started for tachycardia in 10/19.  She continues to have some dyspnea on exertion.  She has an oxygen saturation monitor which shows that her oxygen saturations dropped into the low 80s with activity.  When she stops and rests, she feels better and her saturations will get back up to 98%.  She was seen in the emergency room yesterday.  She had a low level positive point-of-care troponin which was similar to prior.  She felt fine and was sent home from the ER.  She continues to have dyspnea on exertion this morning.  We checked her oxygen saturation monitor.  With minimal walking in the exam room, her oxygen saturations dropped  to 93%.  With sitting and deep breathing, they came back up to 98%.  She recalled her anginal symptoms from prior to her RCA stents several years ago.  She had a symptom at cardiac rehab that prompted evaluation.  She has not had this type of symptom recently.  Past Medical History:  Diagnosis Date  . AAA (abdominal aortic aneurysm) ([place])    a. small by cath 2014.  . Arthritis    "across my hips; buttocks; comes w/the weather" (02/27/2013)  . CAD (coronary artery disease), native coronary artery    a. 10/2012 - rotational atherectomy of LAD c/b dissection s/p PCI to LAD, LCx, LM. b. 12/2012 - s/p overlapping DES to RCA.  . Cataract    "just the beginnings on the right" (02/27/2013)  . Cellulitis 06/20/2012   "dog bite; right forearm" (06/24/2012)  . Chronic bronchitis ([place])    "used to have it alot; haven't had it in a long time" (02/27/2013)  . Chronic combined systolic and diastolic CHF (congestive heart failure) ([place])   . COPD (chronic obstructive pulmonary disease) ([place])    "little bit" (02/27/2013)  . GERD (gastroesophageal reflux disease)   . Hypercholesterolemia   . Ischemic cardiomyopathy    a. EF 45-50%.  . Mass of lung    "small tumor RUL; they are watching it" (02/27/2013)  . Myocardial infarction ([place])    "  think dr said I've had 2 silent one" (02/27/2013)  . Peripheral vascular disease, unspecified ([place])   . Pneumonia 1990's   "once" (02/27/2013)  . Seasonal allergies   . Severe aortic stenosis   . Tachycardia, unspecified   . Tobacco abuse     Past Surgical History:  Procedure Laterality Date  . CORONARY ANGIOPLASTY WITH STENT PLACEMENT  10/2012; 12/08/2012   "7 + 3" (02/27/2013)  . ESOPHAGOGASTRODUODENOSCOPY (EGD) WITH PROPOFOL N/A 01/11/2015   Procedure: ESOPHAGOGASTRODUODENOSCOPY (EGD) WITH PROPOFOL;  Surgeon: Garlan Fair, MD;  [facility];  Service: Endoscopy;  Laterality: N/A;  . FLEXIBLE BRONCHOSCOPY N/A 03/02/2013   Procedure: FLEXIBLE  BRONCHOSCOPY;  Surgeon: Gaye Pollack, MD;  Location: [place] [place];  Service: Thoracic;  Laterality: N/A;  . FLEXIBLE SIGMOIDOSCOPY N/A 01/11/2015   Procedure: FLEXIBLE SIGMOIDOSCOPY;  Surgeon: Garlan Fair, MD;  [facility];  Service: Endoscopy;  Laterality: N/A;  unable to complete colon-prep issues  . GANGLION CYST EXCISION Left 1975   "wrist"  . LEFT HEART CATH AND CORONARY ANGIOGRAPHY N/A 06/29/2016   Procedure: Left Heart Cath and Coronary Angiography;  Surgeon: Jettie Booze, MD;  Location: [facility];  Service: Cardiovascular;  Laterality: N/A;  . LEFT HEART CATHETERIZATION WITH CORONARY ANGIOGRAM N/A 12/08/2012   Procedure: LEFT HEART CATHETERIZATION WITH CORONARY ANGIOGRAM;  Surgeon: Jettie Booze, MD;  Location: [facility] CATH LAB;  Service: Cardiovascular;  Laterality: N/A;  . MOUTH SURGERY  2010?   "for bone loss" (06/24/2012)  . PERCUTANEOUS CORONARY STENT INTERVENTION (PCI-S) N/A 10/09/2012   Procedure: PERCUTANEOUS CORONARY STENT INTERVENTION (PCI-S);  Surgeon: Jettie Booze, MD;  Location: [facility] CATH LAB;  Service: Cardiovascular;  Laterality: N/A;  . THORACOTOMY/LOBECTOMY Right 03/02/2013   Procedure: Right Video Assisted Thoracoscopy/Thoracotomy with upper Lobectomy;  Surgeon: Gaye Pollack, MD;  Location: [facility] OR;  Service: Thoracic;  Laterality: Right;  Right Lung Upper  Lobectomy   . TONSILLECTOMY  1960  . VIDEO BRONCHOSCOPY  09/12/2011   Procedure: VIDEO BRONCHOSCOPY WITH FLUORO;  Surgeon: Tanda Rockers, MD;  Location: [facility];  Service: Cardiopulmonary;  Laterality: Bilateral;     Current Outpatient Medications  Medication Sig Dispense Refill  . albuterol (PROVENTIL HFA) 108 (90 BASE) MCG/ACT inhaler Inhale 2 puffs into the lungs 2 (two) times daily as needed for wheezing or shortness of breath.     . Alirocumab (PRALUENT) 150 MG/ML SOPN Inject 150 mg into the skin every 14 (fourteen) days. 2 pen 11  . cholecalciferol (VITAMIN D) 1000 UNITS  tablet Take 1 tablet (1,000 Units total) by mouth at bedtime. 90 tablet 0  . clopidogrel (PLAVIX) 75 MG tablet Take 1 tablet (75 mg total) by mouth daily. 90 tablet 2  . diltiazem (CARDIZEM CD) 120 MG 24 hr capsule Take 1 capsule (120 mg total) by mouth daily. 90 capsule 3  . fluticasone (FLONASE) 50 MCG/ACT nasal spray Place into both nostrils at bedtime.   4  . furosemide (LASIX) 20 MG tablet Take 1 tablet (20 mg total) by mouth daily. 90 tablet 1  . Hypromellose (GENTEAL MILD) 0.2 % SOLN Place 2 drops into both eyes as needed.    [name] ipratropium (ATROVENT) 0.06 % nasal spray Place 2 sprays into both nostrils 4 (four) times daily.  0  . lansoprazole (PREVACID) 30 MG capsule Take 1 capsule (30 mg total) by mouth daily at 12 noon. 90 capsule 3  . levalbuterol (XOPENEX HFA) 45 MCG/ACT inhaler Inhale 1 puff into the  lungs as needed.  2  . NASAL SALINE NA Place 1 spray into the nose daily as needed (congestion).     . nitroGLYCERIN (NITROSTAT) 0.4 MG SL tablet Place 1 tablet (0.4 mg total) under the tongue every 5 (five) minutes as needed for chest pain. 25 tablet 4  . OVER THE COUNTER MEDICATION Take 1 tablet by mouth daily with lunch. [facility] Menopause support    . potassium chloride SA (K-DUR,KLOR-CON) 20 MEQ tablet Take 0.5 tablets (10 mEq total) by mouth daily. 90 tablet 1  . predniSONE (DELTASONE) 10 MG tablet Take 10 mg by mouth as directed.  0  . predniSONE (DELTASONE) 10 MG tablet Take 2 tablets (20 mg total) by mouth daily for 5 days. 10 tablet 0  . ranitidine (ZANTAC) 150 MG tablet Take 150 mg by mouth daily as needed for heartburn.     . simethicone (MYLICON) 80 MG chewable tablet Chew 160 mg by mouth every 6 (six) hours as needed for flatulence.     No current facility-administered medications for this visit.     Allergies:   Azithromycin; Ceclor [cefaclor]; Lipitor [atorvastatin]; Morphine and related; Penicillins; Septra [sulfamethoxazole-trimethoprim]; Simvastatin; Vicodin  [hydrocodone-acetaminophen]; Zetia [ezetimibe]; Doxycycline; and Adhesive [tape]    Social History:  The patient  reports that she quit smoking about 5 years ago. Her smoking use included cigarettes. She has a 20.00 pack-year smoking history. She has never used smokeless tobacco. She reports that she does not drink alcohol or use drugs.   Family History:  The patient's family history includes Alcohol abuse in her father; Lupus in her mother; Migraines in her sister; Other in her sister; Pulmonary fibrosis in her brother.    ROS:  Please see the history of present illness.   Otherwise, review of systems are positive for dyspnea on exertion.   All other systems are reviewed and negative.    PHYSICAL EXAM: VS:  BP (!) 150/90   Pulse (!) 114   Ht 4\' 11"  (1.499 m)   Wt 125 lb (56.7 kg)   LMP 03/25/2011   SpO2 98%   BMI 25.25 kg/m  , BMI Body mass index is 25.25 kg/m. GEN: Well nourished, well developed, in no acute distress  HEENT: normal  Neck: no JVD, carotid bruits, or masses Cardiac: Intermittent tachycardia, RRR; 2 out of 6 systolic murmurs, rubs, or gallops,no edema  Respiratory:  clear to auscultation bilaterally, normal work of breathing GI: soft, nontender, nondistended, + BS MS: no deformity or atrophy  Skin: warm and dry, no rash Neuro:  Strength and sensation are intact Psych: euthymic mood, full affect   EKG:   The ekg ordered  demonstrates NSR, nonspecific inferior ST changes   Recent Labs: 01/02/2017: ALT 13 10/27/2017: B Natriuretic Peptide 503.8; TSH 1.576 12/16/2017: BUN 12; Creatinine, Ser 0.68; Hemoglobin 12.2; Platelets 331; Potassium 4.1; Sodium 137   Lipid Panel    Component Value Date/Time   CHOL 152 08/18/2015 0840   TRIG 105 08/18/2015 0840   HDL 65 08/18/2015 0840   CHOLHDL 2.3 08/18/2015 0840   VLDL 21 08/18/2015 0840   LDLCALC 66 08/18/2015 0840     Other studies Reviewed: Additional studies/ records that were reviewed today with results  demonstrating: Prior cath records reviewed.  Patent stents in 2018.   ASSESSMENT AND PLAN:  1. CAD: Now with dyspnea on exertion.  Symptoms are different from her prior angina.  Continue aggressive secondary prevention.  She has pulmonary function test coming up in a  few days.  If no significant abnormalities found on PFTs, would plan for cardiac catheterization to further evaluate her heart. 2. Aortic stenosis: Mild by cath in 2018.  Echo has always overestimated severity of her gradient for some reason.  On physical exam, she does not have signs of severe aortic stenosis.  Cath in 2018 showed that the valve was easy to cross and the gradient was low. 3. Combined systolic and diastolic heart failure- chronic: She currently appears euvolemic.4 4. HTN: COntinue with the higher dose of diltiazem, 240 mg daily to help with blood pressure and heart rate. 5. Please check DLCO when she has her PFTs.  I suspect she has significant COPD from years of smoking.   Current medicines are reviewed at length with the patient today.  The patient concerns regarding her medicines were addressed.  The following changes have been made:  No change  Labs/ tests ordered today include: Based on PFT No orders of the defined types were placed in this encounter.   Recommend 150 minutes/week of aerobic exercise Low fat, low carb, high fiber diet recommended  Disposition:   FU in 3 months or sooner if symptoms get worse.  We also went over going to the ER if her anginal symptoms recurred.   Signed, Larae Grooms, MD  12/17/2017 8:03 AM    [facility] [address], [place], [place]  [address] Phone: [phone]; Fax: [phone]

## 2017-12-16 NOTE — ED Provider Notes (Signed)
[place] [facility] EMERGENCY DEPARTMENT Provider Note   CSN: [id number] Arrival date & time: 12/16/17  1429     History   Chief Complaint Chief Complaint  Patient presents with  . Shortness of Breath    HPI Teresa [name] is a 67 y.o. female.  67 year old female with prior medical history as detailed below presents with complaint of SOB. She reports using albuterol MDI at home.  This provided significant relief.  She does report a course of steroids approximately 2 weeks ago which also provided significant relief.  Patient denies associated chest pain, fever, cough, back pain, nausea, vomiting, or other acute complaint.  She reports a prior history of congestive heart failure.  However, her symptoms today do not feel like prior episodes of CHF.  The history is provided by the patient and medical records.  Shortness of Breath  This is a recurrent problem. The average episode lasts 2 weeks. The problem occurs intermittently.The current episode started more than 1 week ago. The problem has been resolved. Pertinent negatives include no fever, no cough, no sputum production and no chest pain.    Past Medical History:  Diagnosis Date  . AAA (abdominal aortic aneurysm) ([place])    a. small by cath 2014.  . Arthritis    "across my hips; buttocks; comes w/the weather" (02/27/2013)  . CAD (coronary artery disease), native coronary artery    a. 10/2012 - rotational atherectomy of LAD c/b dissection s/p PCI to LAD, LCx, LM. b. 12/2012 - s/p overlapping DES to RCA.  . Cataract    "just the beginnings on the right" (02/27/2013)  . Cellulitis 06/20/2012   "dog bite; right forearm" (06/24/2012)  . Chronic bronchitis ([place])    "used to have it alot; haven't had it in a long time" (02/27/2013)  . Chronic combined systolic and diastolic CHF (congestive heart failure) ([place])   . COPD (chronic obstructive pulmonary disease) ([place])    "little bit" (02/27/2013)  . GERD (gastroesophageal  reflux disease)   . Hypercholesterolemia   . Ischemic cardiomyopathy    a. EF 45-50%.  . Mass of lung    "small tumor RUL; they are watching it" (02/27/2013)  . Myocardial infarction [facility])    "think dr said I've had 2 silent one" (02/27/2013)  . Peripheral vascular disease, unspecified ([place])   . Pneumonia 1990's   "once" (02/27/2013)  . Seasonal allergies   . Severe aortic stenosis   . Tachycardia, unspecified   . Tobacco abuse     Patient Active Problem List   Diagnosis Date Noted  . Chronic diastolic CHF (congestive heart failure) ([place]) 12/03/2017  . Dyspnea on exertion 10/27/2017  . Obesity 07/28/2013  . Lung cancer, upper lobe ([place]) 03/31/2013  . Tachycardia, unspecified   . Lung mass 02/27/2013  . Mixed hyperlipidemia 12/26/2012  . Coronary atherosclerosis of native coronary artery 12/26/2012  . Unstable angina ([place])   . CAD (coronary artery disease), native coronary artery   . Peripheral vascular disease, unspecified ([place])   . Nonspecific abnormal unspecified cardiovascular function study   . Cough 08/19/2012  . Solitary pulmonary nodule 06/29/2012  . Pneumonia 06/26/2012  . Dog bite of forearm 06/23/2012  . Cellulitis 06/23/2012  . COPD GOLD II 11/07/2011  . Smoker 11/07/2011  . Multiple pulmonary nodules 08/25/2011  . Aortic stenosis 08/25/2011    Past Surgical History:  Procedure Laterality Date  . CORONARY ANGIOPLASTY WITH STENT PLACEMENT  10/2012; 12/08/2012   "7 + 3" (02/27/2013)  .  ESOPHAGOGASTRODUODENOSCOPY (EGD) WITH PROPOFOL N/A 01/11/2015   Procedure: ESOPHAGOGASTRODUODENOSCOPY (EGD) WITH PROPOFOL;  Surgeon: Garlan Fair, MD;  [facility];  Service: Endoscopy;  Laterality: N/A;  . FLEXIBLE BRONCHOSCOPY N/A 03/02/2013   Procedure: FLEXIBLE BRONCHOSCOPY;  Surgeon: Gaye Pollack, MD;  Location: [facility];  Service: Thoracic;  Laterality: N/A;  . FLEXIBLE SIGMOIDOSCOPY N/A 01/11/2015   Procedure: FLEXIBLE SIGMOIDOSCOPY;  Surgeon: Garlan Fair, MD;  [facility];  Service: Endoscopy;  Laterality: N/A;  unable to complete colon-prep issues  . GANGLION CYST EXCISION Left 1975   "wrist"  . LEFT HEART CATH AND CORONARY ANGIOGRAPHY N/A 06/29/2016   Procedure: Left Heart Cath and Coronary Angiography;  Surgeon: Jettie Booze, MD;  Location: [facility] CV LAB;  Service: Cardiovascular;  Laterality: N/A;  . LEFT HEART CATHETERIZATION WITH CORONARY ANGIOGRAM N/A 12/08/2012   Procedure: LEFT HEART CATHETERIZATION WITH CORONARY ANGIOGRAM;  Surgeon: Jettie Booze, MD;  Location: [facility] CATH LAB;  Service: Cardiovascular;  Laterality: N/A;  . MOUTH SURGERY  2010?   "for bone loss" (06/24/2012)  . PERCUTANEOUS CORONARY STENT INTERVENTION (PCI-S) N/A 10/09/2012   Procedure: PERCUTANEOUS CORONARY STENT INTERVENTION (PCI-S);  Surgeon: Jettie Booze, MD;  Location: [facility] CATH LAB;  Service: Cardiovascular;  Laterality: N/A;  . THORACOTOMY/LOBECTOMY Right 03/02/2013   Procedure: Right Video Assisted Thoracoscopy/Thoracotomy with upper Lobectomy;  Surgeon: Gaye Pollack, MD;  Location: [facility] OR;  Service: Thoracic;  Laterality: Right;  Right Lung Upper  Lobectomy   . TONSILLECTOMY  1960  . VIDEO BRONCHOSCOPY  09/12/2011   Procedure: VIDEO BRONCHOSCOPY WITH FLUORO;  Surgeon: Tanda Rockers, MD;  Location[facility];  Service: Cardiopulmonary;  Laterality: Bilateral;     OB History   None      Home Medications    Prior to Admission medications   Medication Sig Start Date End Date Taking? Authorizing Provider  albuterol (PROVENTIL HFA) 108 (90 BASE) MCG/ACT inhaler Inhale 2 puffs into the lungs 2 (two) times daily as needed for wheezing or shortness of breath.     [provider]  Alirocumab (PRALUENT) 150 MG/ML SOPN Inject 150 mg into the skin every 14 (fourteen) days. 07/31/17   Jettie Booze, MD  cholecalciferol (VITAMIN D) 1000 UNITS tablet Take 1 tablet (1,000 Units total) by mouth at bedtime. 11/26/13    Jettie Booze, MD  clopidogrel (PLAVIX) 75 MG tablet Take 1 tablet (75 mg total) by mouth daily. 11/19/17   Jettie Booze, MD  diltiazem (CARDIZEM CD) 120 MG 24 hr capsule Take 1 capsule (120 mg total) by mouth daily. 12/03/17   Imogene Burn, PA-C  fluticasone (FLONASE) 50 MCG/ACT nasal spray Place into both nostrils at bedtime.  09/23/14   [provider]  furosemide (LASIX) 20 MG tablet Take 1 tablet (20 mg total) by mouth daily. 11/08/17   Jettie Booze, MD  Hypromellose (GENTEAL MILD) 0.2 % SOLN Place 2 drops into both eyes as needed.    [provider]  lansoprazole (PREVACID) 30 MG capsule Take 1 capsule (30 mg total) by mouth daily at 12 noon. 07/18/16   Jettie Booze, MD  NASAL SALINE NA Place 1 spray into the nose daily as needed (congestion).     [provider]  nitroGLYCERIN (NITROSTAT) 0.4 MG SL tablet Place 1 tablet (0.4 mg total) under the tongue every 5 (five) minutes as needed for chest pain. 12/03/17   Imogene Burn, PA-C  OVER THE COUNTER MEDICATION  Take 1 tablet by mouth daily with lunch. [facility] Menopause support    [provider]  potassium chloride SA (K-DUR,KLOR-CON) 20 MEQ tablet Take 0.5 tablets (10 mEq total) by mouth daily. 12/10/17   Imogene Burn, PA-C  predniSONE (DELTASONE) 10 MG tablet Take 10 mg by mouth as directed. 11/29/17   [provider]  predniSONE (DELTASONE) 10 MG tablet Take 2 tablets (20 mg total) by mouth daily for 5 days. 12/16/17 12/21/17  Valarie Merino, MD  ranitidine (ZANTAC) 150 MG tablet Take 150 mg by mouth daily as needed for heartburn.     [provider]  simethicone (MYLICON) 80 MG chewable tablet Chew 160 mg by mouth every 6 (six) hours as needed for flatulence.    [provider]    Family History Family History  Problem Relation Age of Onset  . Alcohol abuse Father   . Lupus Mother   . Other Sister        Degenerative disc disease  . Migraines  Sister   . Pulmonary fibrosis Brother   . Heart attack Neg Hx     Social History Social History   Tobacco Use  . Smoking status: Former Smoker    Packs/day: 0.50    Years: 40.00    Pack years: 20.00    Types: Cigarettes    Last attempt to quit: 10/08/2012    Years since quitting: 5.1  . Smokeless tobacco: Never Used  Substance Use Topics  . Alcohol use: No  . Drug use: No     Allergies   Azithromycin; Ceclor [cefaclor]; Lipitor [atorvastatin]; Morphine and related; Penicillins; Septra [sulfamethoxazole-trimethoprim]; Simvastatin; Vicodin [hydrocodone-acetaminophen]; Zetia [ezetimibe]; Doxycycline; and Adhesive [tape]   Review of Systems Review of Systems  Constitutional: Negative for fever.  Respiratory: Positive for shortness of breath. Negative for cough and sputum production.   Cardiovascular: Negative for chest pain.  All other systems reviewed and are negative.    Physical Exam Updated Vital Signs BP 126/70   Pulse 81   Temp 97.9 F (36.6 C)   Resp 13   LMP 03/25/2011   SpO2 92%   Physical Exam  Constitutional: She is oriented to person, place, and time. She appears well-developed and well-nourished. No distress.  HENT:  Head: Normocephalic and atraumatic.  Mouth/Throat: Oropharynx is clear and moist.  Eyes: Pupils are equal, round, and reactive to light. Conjunctivae and EOM are normal.  Neck: Normal range of motion. Neck supple.  Cardiovascular: Normal rate, regular rhythm and normal heart sounds.  No JVD  No LE edema   Pulmonary/Chest: Effort normal and breath sounds normal. No respiratory distress.  Abdominal: Soft. She exhibits no distension. There is no tenderness.  Musculoskeletal: Normal range of motion. She exhibits no edema or deformity.  Neurological: She is alert and oriented to person, place, and time.  Skin: Skin is warm and dry.  Psychiatric: She has a normal mood and affect.  Nursing note and vitals reviewed.    ED Treatments /  Results  Labs (all labs ordered are listed, but only abnormal results are displayed) Labs Reviewed  BASIC METABOLIC PANEL - Abnormal; Notable for the following components:      Result Value   Glucose, Bld 120 (*)    All other components within normal limits  CBC  I-STAT TROPONIN, ED    EKG EKG Interpretation  Date/Time:  Monday December 16 2017 14:32:01 EDT Ventricular Rate:  99 PR Interval:  148 QRS Duration: 86 QT Interval:  314 QTC Calculation: 402 R Axis:   63 Text Interpretation:  Normal sinus rhythm Minimal voltage criteria for LVH, may be normal variant Anterior infarct , age undetermined T wave abnormality, consider inferolateral ischemia Abnormal ECG Confirmed by Dene Gentry [phone]) on 12/16/2017 9:04:54 PM   Radiology Dg Chest 2 View  Result Date: 12/16/2017 CLINICAL DATA:  Shortness of breath. EXAM: CHEST - 2 VIEW COMPARISON:  10/27/2017 and 10/26/2017 and 06/28/2016 FINDINGS: Postsurgical changes in the right lung with chronic volume loss in the right hemithorax. Stable tenting of the right hemidiaphragm. Coarse interstitial lung markings are similar to the recent comparison examination. Heart size is normal and stable. Atherosclerotic calcifications at the aortic arch. Trachea is midline. Evidence for coronary artery stents. No large pleural effusions. No acute bone abnormality. IMPRESSION: Stable chest radiograph findings with prominent interstitial lung markings. Findings could represent chronic changes versus interstitial edema. Postoperative changes as described. Electronically Signed   By: Markus Daft M.D.   On: 12/16/2017 15:24    Procedures Procedures (including critical care time)  Medications Ordered in ED Medications  predniSONE (DELTASONE) tablet 40 mg (40 mg Oral Given 12/16/17 2239)     Initial Impression / Assessment and Plan / ED Course  I have reviewed the triage vital signs and the nursing notes.  Pertinent labs & imaging results that were  available during my care of the patient were reviewed by me and considered in my medical decision making (see chart for details).     MDM  Screen complete  Patient's reported symptoms are most consistent with likely bronchospasm.  Patient has a long history of smoking and COPD.  Patient without evidence on exam of CHF exacerbation.  Patient without significant acute pathology found on her screening labs.  Patient was offered further observation in the ED and/or admission.  She declines same.  She desires discharge home.  She does report that she has an appointment tomorrow morning with her cardiologist at 8 AM.  Given patient's likely bronchospasm, I will give her a course of prednisone for symptomatic treatment.  Patient has albuterol at home.  No evidence on exam or on screening labs of infection that would require antibiotics.   Strict return precautions given and understood.  Importance of close follow-up repeatedly stressed.  Final Clinical Impressions(s) / ED Diagnoses   Final diagnoses:  Bronchospasm    ED Discharge Orders         Ordered    predniSONE (DELTASONE) 10 MG tablet  Daily     12/16/17 2238           Valarie Merino, MD 12/16/17 2252

## 2017-12-16 NOTE — ED Notes (Signed)
Discharge instructions and prescriptions discussed with Pt. Pt verbalized understanding. Pt stable and ambulatory.   

## 2017-12-17 ENCOUNTER — Ambulatory Visit: Payer: Commercial Managed Care - PPO | Admitting: Interventional Cardiology

## 2017-12-17 ENCOUNTER — Encounter: Payer: Self-pay | Admitting: Interventional Cardiology

## 2017-12-17 VITALS — BP 150/90 | HR 114 | Ht 59.0 in | Wt 125.0 lb

## 2017-12-17 DIAGNOSIS — I25119 Atherosclerotic heart disease of native coronary artery with unspecified angina pectoris: Secondary | ICD-10-CM

## 2017-12-17 DIAGNOSIS — I5042 Chronic combined systolic (congestive) and diastolic (congestive) heart failure: Secondary | ICD-10-CM

## 2017-12-17 DIAGNOSIS — R0600 Dyspnea, unspecified: Secondary | ICD-10-CM

## 2017-12-17 DIAGNOSIS — I350 Nonrheumatic aortic (valve) stenosis: Secondary | ICD-10-CM | POA: Diagnosis not present

## 2017-12-17 DIAGNOSIS — R0609 Other forms of dyspnea: Secondary | ICD-10-CM

## 2017-12-17 DIAGNOSIS — I10 Essential (primary) hypertension: Secondary | ICD-10-CM

## 2017-12-17 MED ORDER — DILTIAZEM HCL ER COATED BEADS 240 MG PO CP24: 240.0000 mg | ORAL_CAPSULE | Freq: Every day | ORAL | 3 refills | Status: DC

## 2017-12-17 NOTE — Patient Instructions (Addendum)
Medication Instructions:  Your physician has recommended you make the following change in your medication:   INCREASE: diltiazem to 240 mg once daily  If you need a refill on your cardiac medications before your next appointment, please call your pharmacy.   Lab work: None Ordered  If you have labs (blood work) drawn today and your tests are completely normal, you will receive your results only by: [name] MyChart Message (if you have MyChart) OR . A paper copy in the mail If you have any lab test that is abnormal or we need to change your treatment, we will call you to review the results.  Testing/Procedures: Keep your appointment for your Pulmonary Function Test  Follow-Up: . Your physician recommends that you schedule a follow-up appointment in: 3 months with Dr. Irish Lack .   Any Other Special Instructions Will Be Listed Below (If Applicable).

## 2017-12-18 ENCOUNTER — Telehealth: Payer: Self-pay | Admitting: Interventional Cardiology

## 2017-12-18 NOTE — Telephone Encounter (Signed)
Called and made patient aware that she is suppose top be taking diltiazem 240 mg QD. Patient verbalized understanding and thanked me for the call.

## 2017-12-18 NOTE — Telephone Encounter (Signed)
  Pt c/o medication issue:  1. Name of Medication: diltiazem (CARDIZEM CD) 240 MG 24 hr capsule  2. How are you currently taking this medication (dosage and times per day)? Take 1 capsule (240 mg total) by mouth daily  3. Are you having a reaction (difficulty breathing--STAT)?  No  4. What is your medication issue? Pt was thinking she was supposed to be taking 180 mg instead of 240 mg and wanted to make sure before she picked up the new prescription

## 2017-12-23 ENCOUNTER — Ambulatory Visit: Payer: Commercial Managed Care - PPO | Admitting: Interventional Cardiology

## 2017-12-25 ENCOUNTER — Telehealth: Payer: Self-pay | Admitting: Interventional Cardiology

## 2017-12-25 NOTE — Telephone Encounter (Signed)
Returned call to patient. Patient states that she had her PFTs done on Monday and her PCP prescribed Advair and referred her to pulmonology. She has an appointment with Dr. Melvyn Novas on 10/29. Patient states that she feels great and is wondering if Dr. Irish Lack wants to write her a note to return to work or if he wants her to see Dr. Melvyn Novas first. Made patient aware that I would review with Dr. Irish Lack and let her know. Also made patient aware that I will request a copy of her PFTs since they are not in our system. Patient verbalized understanding and thanked me for the call.

## 2017-12-25 NOTE — Telephone Encounter (Signed)
  Wants to know how she can get a note to go back to work

## 2017-12-27 NOTE — Telephone Encounter (Signed)
Called and made patient aware that Dr. Irish Lack has reviewed her test and we have written her a letter to return to work on 01/01/18 with no restrictions. Made patient aware the her letter will be left up front for her to pick up. Also made patient aware that her FMLA paperwork has been completed and returned back to Medical Records. Patient verbalized understanding and thanked me for the call.

## 2017-12-31 ENCOUNTER — Encounter: Payer: Self-pay | Admitting: Internal Medicine

## 2017-12-31 ENCOUNTER — Ambulatory Visit (INDEPENDENT_AMBULATORY_CARE_PROVIDER_SITE_OTHER): Payer: Commercial Managed Care - PPO | Admitting: Internal Medicine

## 2017-12-31 DIAGNOSIS — J449 Chronic obstructive pulmonary disease, unspecified: Secondary | ICD-10-CM

## 2017-12-31 MED ORDER — TIOTROPIUM BROMIDE-OLODATEROL 2.5-2.5 MCG/ACT IN AERS: 2.0000 | INHALATION_SPRAY | Freq: Every day | RESPIRATORY_TRACT | 11 refills | Status: DC

## 2017-12-31 MED ORDER — TIOTROPIUM BROMIDE-OLODATEROL 2.5-2.5 MCG/ACT IN AERS: 2.0000 | INHALATION_SPRAY | Freq: Every day | RESPIRATORY_TRACT | 0 refills | Status: DC

## 2017-12-31 MED ORDER — TIOTROPIUM BROMIDE-OLODATEROL 2.5-2.5 MCG/ACT IN AERS: 1.0000 | INHALATION_SPRAY | Freq: Every day | RESPIRATORY_TRACT | 11 refills | Status: DC

## 2017-12-31 NOTE — Progress Notes (Signed)
Subjective:    Patient ID: Teresa [name], female    DOB: February 20, 1951   MRN: [id number]     Brief patient profile:  44 yowf quit smoking July 2014  referred 08/24/2011 to pulmonary clinic  by Dr Maxwell Caul for evaluation of spn     History of Present Illness  08/24/2011 1st pulmonary ov cc acute onset chest congestion Dec 2012 > cxr with RUL nodule > ct Pos SPN but all symptoms resolved with no hemoptysis or R CP and good ex tol in between flares of bronchitis maybe once a year and rare need for saba hfa.  In meantime repeat CT Chest c/w evolving increase in density RUL lesion so referred to pulmonary. Has dx of mod to severe AS with most recent echo done 2 year prior to [place]  But yearly eval due w/in a week.  No ex cp or presyncope, denies any limiting sob with desired activities. rec You have a small nodule in right upper lobe that could be a atypical infection or an early tumor     DIAGNOSIS: Stage IB (T2a., N0, M0) poorly differentiated squamous cell carcinoma, diagnosed in December of 2014.  PRIOR THERAPY: Flexible video bronchoscopy, right upper lobectomy with mediastinal lymph node dissection under the care of Dr. Cyndia Bent.   CURRENT THERAPY: Observation as of ov 01/10/16 s any adjuvant rx    01/13/2016   Consultation Lariza Cothron re:  Abn ct/ cough x 3 days on gerd rx tiw and cough drops  Chief Complaint  Patient presents with  . Pulmonary Consult    Referred by Dr. Earlie Server for eval of recent chest ct. She c/o cough for the past few days- prod with clear sputum.    Not limited by breathing from desired activities  / chasing toddlers around all  Day s need for albuterol except in the spring and the fall   Imp gold II copd/ uacs  rec When coughing > take prevacid 30 mg Take 30-60 min before first meal of the day and pepcid ac 20 mg at bedtime until you stop coughing GERD rx     12/31/2017 acute extended ov/Suda Forbess re:  Cough / sp ER eval 12/16/17 for bronchospasm rx prednisone and  improved some Chief Complaint  Patient presents with  . Acute Visit    Pt states having "bronchitis and bronchial spasms" since September 2019.  She is "hacking"- non prod cough.   was using proair in aug 2019 3-4 x per week for cough/ wheeze stopped working as well  Sept 2019  and rx pred/advair 12/26/17 helped breathing  a lot but throat raw now and dry day > noct cough/ urge to clear throat which is new since started advair 250 dpi Sleep flat bed/ large pillow on side Walked at mall since starting advair nl pace  Not needing saba now    No obvious day to day or daytime variability or assoc excess/ purulent sputum or mucus plugs or hemoptysis or cp or chest tightness, subjective wheeze or overt sinus or hb symptoms.   sleeping without nocturnal  or early am exacerbation  of respiratory  c/o's or need for noct saba. Also denies any obvious fluctuation of symptoms with weather or environmental changes or other aggravating or alleviating factors except as outlined above   No unusual exposure hx or h/o childhood pna/ asthma or knowledge of premature birth.  Current Allergies, Complete Past Medical History, Past Surgical History, Family History, and Social History were reviewed in [employer]  medical record.  ROS  The following are not active complaints unless bolded Hoarseness, sore throat, dysphagia, dental problems, itching, sneezing,  nasal congestion or discharge of excess mucus or purulent secretions, ear ache,   fever, chills, sweats, unintended wt loss or wt gain, classically pleuritic or exertional cp,  orthopnea pnd or arm/hand swelling  or leg swelling, presyncope, palpitations, abdominal pain, anorexia, nausea, vomiting, diarrhea  or change in bowel habits or change in bladder habits, change in stools or change in urine, dysuria, hematuria,  rash, arthralgias, visual complaints, headache, numbness, weakness or ataxia or problems with walking or coordination,  change in  mood or  memory.        Current Meds  Medication Sig  . ADVAIR DISKUS 250-50 MCG/DOSE AEPB Inhale 1 puff into the lungs 2 (two) times daily.  [name] albuterol (PROAIR HFA) 108 (90 Base) MCG/ACT inhaler Inhale 2 puffs into the lungs every 6 (six) hours as needed for wheezing or shortness of breath.  . Alirocumab (PRALUENT) 150 MG/ML SOPN Inject 150 mg into the skin every 14 (fourteen) days.  . cholecalciferol (VITAMIN D) 1000 UNITS tablet Take 1 tablet (1,000 Units total) by mouth at bedtime.  . clopidogrel (PLAVIX) 75 MG tablet Take 1 tablet (75 mg total) by mouth daily.  [name] diltiazem (CARDIZEM CD) 240 MG 24 hr capsule Take 1 capsule (240 mg total) by mouth daily.  . fluticasone (FLONASE) 50 MCG/ACT nasal spray Place into both nostrils at bedtime.   . furosemide (LASIX) 20 MG tablet Take 1 tablet (20 mg total) by mouth daily.  . Hypromellose (GENTEAL MILD) 0.2 % SOLN Place 2 drops into both eyes as needed.  . lansoprazole (PREVACID) 30 MG capsule Take 1 capsule (30 mg total) by mouth daily at 12 noon.  [name] NASAL SALINE NA Place 1 spray into the nose daily as needed (congestion).   . nitroGLYCERIN (NITROSTAT) 0.4 MG SL tablet Place 1 tablet (0.4 mg total) under the tongue every 5 (five) minutes as needed for chest pain.  [name] OVER THE COUNTER MEDICATION Take 1 tablet by mouth daily with lunch. [facility] Menopause support  . potassium chloride SA (K-DUR,KLOR-CON) 20 MEQ tablet Take 0.5 tablets (10 mEq total) by mouth daily.  . ranitidine (ZANTAC) 150 MG tablet Take 150 mg by mouth daily as needed for heartburn.   . simethicone (MYLICON) 80 MG chewable tablet Chew 160 mg by mouth every 6 (six) hours as needed for flatulence.                    Objective:   Physical Exam  amb wf slt hoarse/ classic voice fatigue   Vital signs reviewed - Note on arrival 02 sats  95% on RA    Wt 125  08/24/11 >  11/07/2011  122> 07/14/2012 123 >   01/13/2016   133 > 12/31/2017  120    HEENT: nl dentition, turbinates  bilaterally, and oropharynx. Nl external ear canals without cough reflex   NECK :  without JVD/Nodes/TM/ nl carotid upstrokes bilaterally   LUNGS: no acc muscle use,  Nl contour chest which mostly pseudowheeze and cough on insp not exp  CV: RRR with 3/6 SEM   no s3  or increase in P2, and no edema   ABD:  soft and nontender with nl inspiratory excursion in the supine position. No bruits or organomegaly appreciated, bowel sounds nl  MS:  Nl gait/ ext warm without deformities, calf tenderness, cyanosis or clubbing No obvious joint  restrictions   SKIN: warm and dry without lesions    NEURO:  alert, approp, nl sensorium with  no motor or cerebellar deficits apparent.      I personally reviewed images and agree with radiology impression as follows:  CXR:   12/16/17 Stable chest radiograph findings with prominent interstitial lung markings. Findings could represent chronic changes versus interstitial edema.   Labs   reviewed:      Chemistry      Component Value Date/Time   NA 137 12/16/2017 1443   NA 140 12/06/2017 1443   NA 140 01/02/2017 0737   K 4.1 12/16/2017 1443   K 4.4 01/02/2017 0737   CL 101 12/16/2017 1443   CO2 28 12/16/2017 1443   CO2 29 01/02/2017 0737   BUN 12 12/16/2017 1443   BUN 22 12/06/2017 1443   BUN 17.2 01/02/2017 0737   CREATININE 0.68 12/16/2017 1443   CREATININE 0.7 01/02/2017 0737      Component Value Date/Time   CALCIUM 9.1 12/16/2017 1443   CALCIUM 9.7 01/02/2017 0737   ALKPHOS 82 01/02/2017 0737   AST 16 01/02/2017 0737   ALT 13 01/02/2017 0737   BILITOT 0.76 01/02/2017 0737        Lab Results  Component Value Date   WBC 7.4 12/16/2017   HGB 12.2 12/16/2017   HCT 40.3 12/16/2017   MCV 92.0 12/16/2017   PLT 331 12/16/2017     No results found for: DDIMER    Lab Results  Component Value Date   TSH 1.576 10/27/2017      BNP   10/27/17 =  503.8     Lab Results  Component Value Date   ESRSEDRATE 97 (H) 06/27/2012           Assessment & Plan:

## 2017-12-31 NOTE — Patient Instructions (Addendum)
Plan A = Automatic = change advair to stiolto 2 pffs each am   Work on inhaler technique:  relax and gently blow all the way out then take a nice smooth deep breath back in, triggering the inhaler at same time you start breathing in.  Hold for up to 5 seconds if you can. Blow out thru nose. Rinse and gargle with water when done    Plan B = Backup Only use your albuterol as a rescue medication to be used if you can't catch your breath by resting or doing a relaxed purse lip breathing pattern.  - The less you use it, the better it will work when you need it. - Ok to use the inhaler up to 2 puffs  every 4 hours if you must but call for appointment if use goes up over your usual need - Don't leave home without it !!  (think of it like the spare tire for your car)   Please schedule a follow up office visit in 6 weeks, call sooner if needed with pfts      Please schedule a follow up office visit in 6 weeks, call sooner if needed with pfts

## 2018-01-01 ENCOUNTER — Encounter: Payer: Self-pay | Admitting: Internal Medicine

## 2018-01-01 NOTE — Assessment & Plan Note (Addendum)
- PFT's 11/07/2011 FEV1  0.96 (51%) ratio 50 and no better p B2, DLCO 78% - PFT's  09/02/2012  FEV1 1.38 (64 % ) ratio 66    with DLCO  73 % corrects to 93 % for alv volume   - quit smoking 09/2012 - Spirometry 12/31/2017  FEV1 0.8 (43%)  Ratio 61 p advair 250     >>>>  After extensive coaching inhaler device,  effectiveness =    90% with smi > try stiolto respimat since this is least likely of all the alternatives to contribute to pseudowheeze / cough as I doubt she's really  declined to gold III and will return in 6 weeks to prove it with full pfts    DDX of  difficult airways management almost all start with A and  include Adherence, Ace Inhibitors, Acid Reflux, Active Sinus Disease, Alpha 1 Antitripsin deficiency, Anxiety masquerading as Airways dz,  ABPA,  Allergy(esp in young), Aspiration (esp in elderly), Adverse effects of meds,  Active smoking or vaping, A bunch of PE's (a small clot burden can't cause this syndrome unless there is already severe underlying pulm or vascular dz with poor reserve) plus two Bs  = Bronchiectasis and Beta blocker use..and one C= CHF   Adherence is always the initial "prime suspect" and is a multilayered concern that requires a "trust but verify" approach in every patient - starting with knowing how to use medications, especially inhalers, correctly, keeping up with refills and understanding the fundamental difference between maintenance and prns vs those medications only taken for a very short course and then stopped and not refilled.  - see hfa teaching  - Formulary restrictions will be an ongoing challenge for the forseable future and I would be happy to pick an alternative if the pt will first  provide me a list of them but pt  will need to return here for training for any new device that is required eg dpi vs hfa vs respimat.  In meantime we can always provide samples so the patient never runs out of any needed respiratory medications.  - return with all meds in  hand using a trust but verify approach to confirm accurate Medication  Reconciliation The principal here is that until we are certain that the  patients are doing what we've asked, it makes no sense to ask them to do more.    ? Adverse effects of dpi > change to hfa   ? Acid (or non-acid) GERD > always difficult to exclude as up to 75% of pts in some series report no assoc GI/ Heartburn symptoms> rec continue max (24h)  acid suppression and diet restrictions/ reviewed    ? Allergy/asthma component > see if flares off ics in which case needs to change to laba/ics by hfa, not dpi   ? chf > she does have AS but cards note reviewed and appears well compensated for now but at risk of cardiac asthma that needs to be considered going forward with borderline BNP noted which should be repeated if flares while on rx for copd     I had an extended discussion with the patient reviewing all relevant studies completed to date and  lasting 25 minutes of a 40  minute acute office visit /transition of care and re-establish p long absence     re  severe non-specific but potentially very serious refractory respiratory symptoms of uncertain and potentially multiple  etiologies.   Each maintenance medication was reviewed in detail  including most importantly the difference between maintenance and prns and under what circumstances the prns are to be triggered using an action plan format that is not reflected in the computer generated alphabetically organized AVS.    Please see AVS for specific instructions unique to this office visit that I personally wrote and verbalized to the the pt in detail and then reviewed with pt  by my nurse highlighting any changes in therapy/plan of care  recommended at today's visit.  See device teaching which extended face to face time for this visit

## 2018-01-02 ENCOUNTER — Inpatient Hospital Stay: Payer: Commercial Managed Care - PPO | Attending: Internal Medicine

## 2018-01-02 ENCOUNTER — Ambulatory Visit (HOSPITAL_COMMUNITY)
Admission: RE | Admit: 2018-01-02 | Discharge: 2018-01-02 | Disposition: A | Discharge status: Home / Self Care | Payer: Commercial Managed Care - PPO | Source: Ambulatory Visit | Attending: Oncology | Admitting: Oncology

## 2018-01-02 DIAGNOSIS — J439 Emphysema, unspecified: Secondary | ICD-10-CM | POA: Diagnosis not present

## 2018-01-02 DIAGNOSIS — C3411 Malignant neoplasm of upper lobe, right bronchus or lung: Secondary | ICD-10-CM | POA: Diagnosis not present

## 2018-01-02 DIAGNOSIS — I2510 Atherosclerotic heart disease of native coronary artery without angina pectoris: Secondary | ICD-10-CM | POA: Diagnosis not present

## 2018-01-02 DIAGNOSIS — Z902 Acquired absence of lung [part of]: Secondary | ICD-10-CM | POA: Diagnosis not present

## 2018-01-02 LAB — COMPREHENSIVE METABOLIC PANEL
ALT: 10 U/L (ref 0–44)
AST: 14 U/L — AB (ref 15–41)
Albumin: 3.9 g/dL (ref 3.5–5.0)
Alkaline Phosphatase: 84 U/L (ref 38–126)
Anion gap: 10 (ref 5–15)
BILIRUBIN TOTAL: 0.8 mg/dL (ref 0.3–1.2)
BUN: 14 mg/dL (ref 8–23)
CO2: 32 mmol/L (ref 22–32)
Calcium: 9.8 mg/dL (ref 8.9–10.3)
Chloride: 100 mmol/L (ref 98–111)
Creatinine, Ser: 0.8 mg/dL (ref 0.44–1.00)
Glucose, Bld: 100 mg/dL — ABNORMAL HIGH (ref 70–99)
POTASSIUM: 4.2 mmol/L (ref 3.5–5.1)
Sodium: 142 mmol/L (ref 135–145)
Total Protein: 7.4 g/dL (ref 6.5–8.1)

## 2018-01-02 LAB — CBC WITH DIFFERENTIAL/PLATELET
ABS IMMATURE GRANULOCYTES: 0.02 10*3/uL (ref 0.00–0.07)
BASOS ABS: 0 10*3/uL (ref 0.0–0.1)
Basophils Relative: 1 %
Eosinophils Absolute: 0.1 10*3/uL (ref 0.0–0.5)
Eosinophils Relative: 2 %
HCT: 38.8 % (ref 36.0–46.0)
HEMOGLOBIN: 12.4 g/dL (ref 12.0–15.0)
IMMATURE GRANULOCYTES: 0 %
LYMPHS PCT: 24 %
Lymphs Abs: 1.7 10*3/uL (ref 0.7–4.0)
MCH: 28.6 pg (ref 26.0–34.0)
MCHC: 32 g/dL (ref 30.0–36.0)
MCV: 89.6 fL (ref 80.0–100.0)
Monocytes Absolute: 0.6 10*3/uL (ref 0.1–1.0)
Monocytes Relative: 8 %
NEUTROS PCT: 65 %
NRBC: 0 % (ref 0.0–0.2)
Neutro Abs: 4.5 10*3/uL (ref 1.7–7.7)
Platelets: 255 10*3/uL (ref 150–400)
RBC: 4.33 MIL/uL (ref 3.87–5.11)
RDW: 13 % (ref 11.5–15.5)
WBC: 6.8 10*3/uL (ref 4.0–10.5)

## 2018-01-02 MED ORDER — IOHEXOL 300 MG/ML  SOLN
75.0000 mL | Freq: Once | INTRAMUSCULAR | Status: AC | PRN
  Administered 2018-01-02: 75 mL via INTRAVENOUS

## 2018-01-02 MED ORDER — SODIUM CHLORIDE 0.9 % IJ SOLN
INTRAMUSCULAR | Status: AC
  Filled 2018-01-02: qty 50

## 2018-01-03 ENCOUNTER — Emergency Department (HOSPITAL_COMMUNITY): Payer: Commercial Managed Care - PPO

## 2018-01-03 ENCOUNTER — Emergency Department (HOSPITAL_COMMUNITY)
Admission: EM | Admit: 2018-01-03 | Discharge: 2018-01-03 | Disposition: A | Discharge status: Home / Self Care | Payer: Commercial Managed Care - PPO | Attending: Emergency Medicine | Admitting: Emergency Medicine

## 2018-01-03 ENCOUNTER — Encounter (HOSPITAL_COMMUNITY): Payer: Self-pay | Admitting: Emergency Medicine

## 2018-01-03 ENCOUNTER — Other Ambulatory Visit: Payer: Self-pay

## 2018-01-03 DIAGNOSIS — Z87891 Personal history of nicotine dependence: Secondary | ICD-10-CM | POA: Diagnosis not present

## 2018-01-03 DIAGNOSIS — Z8709 Personal history of other diseases of the respiratory system: Secondary | ICD-10-CM

## 2018-01-03 DIAGNOSIS — R0600 Dyspnea, unspecified: Secondary | ICD-10-CM | POA: Diagnosis present

## 2018-01-03 DIAGNOSIS — I5042 Chronic combined systolic (congestive) and diastolic (congestive) heart failure: Secondary | ICD-10-CM | POA: Diagnosis not present

## 2018-01-03 DIAGNOSIS — J449 Chronic obstructive pulmonary disease, unspecified: Secondary | ICD-10-CM | POA: Diagnosis not present

## 2018-01-03 DIAGNOSIS — Z79899 Other long term (current) drug therapy: Secondary | ICD-10-CM | POA: Insufficient documentation

## 2018-01-03 LAB — CBC
HEMATOCRIT: 41.1 % (ref 36.0–46.0)
Hemoglobin: 12.6 g/dL (ref 12.0–15.0)
MCH: 27.8 pg (ref 26.0–34.0)
MCHC: 30.7 g/dL (ref 30.0–36.0)
MCV: 90.7 fL (ref 80.0–100.0)
PLATELETS: 297 10*3/uL (ref 150–400)
RBC: 4.53 MIL/uL (ref 3.87–5.11)
RDW: 13 % (ref 11.5–15.5)
WBC: 7.8 10*3/uL (ref 4.0–10.5)
nRBC: 0 % (ref 0.0–0.2)

## 2018-01-03 LAB — BASIC METABOLIC PANEL
Anion gap: 11 (ref 5–15)
BUN: 15 mg/dL (ref 8–23)
CALCIUM: 9.3 mg/dL (ref 8.9–10.3)
CHLORIDE: 97 mmol/L — AB (ref 98–111)
CO2: 29 mmol/L (ref 22–32)
CREATININE: 0.79 mg/dL (ref 0.44–1.00)
GFR calc Af Amer: >60 mL/min (ref >60)
GFR calc non Af Amer: >60 mL/min (ref >60)
GLUCOSE: 122 mg/dL — AB (ref 70–99)
Potassium: 3.9 mmol/L (ref 3.5–5.1)
Sodium: 137 mmol/L (ref 135–145)

## 2018-01-03 LAB — I-STAT TROPONIN, ED: Troponin i, poc: 0.02 ng/mL (ref 0.00–0.08)

## 2018-01-03 NOTE — Discharge Instructions (Addendum)
Continue use of your inhaler as needed.  Return to the emergency department if you develop chest pain, worsening breathing, high fever, or other new and concerning symptoms.

## 2018-01-03 NOTE — ED Notes (Signed)
ED Provider at bedside. 

## 2018-01-03 NOTE — ED Notes (Signed)
Reviewed d/c instructions with pt, who verbalized understanding and had no outstanding questions. Pt departed in NAD, refused use of wheelchair.   

## 2018-01-03 NOTE — ED Triage Notes (Signed)
Pt reports chest pain all day along w/ a dry cough. Pt was sleeping and was woken up gasping for air.  She reports she check her O2 and found it at 88% and weighted herself to find she has gained three pounds.  Pt is compliant w/ medications.

## 2018-01-03 NOTE — ED Provider Notes (Signed)
[facility] EMERGENCY DEPARTMENT Provider Note   CSN: [id number] Arrival date & time: 01/03/18  0010     History   Chief Complaint Chief Complaint  Patient presents with  . Shortness of Breath  . Chest Pain    HPI Teresa [name] is a 67 y.o. female.  Patient is a 67 year old female with history of COPD and an episode of congestive heart failure in the past.  She presents today with complaints of difficulty breathing.  She went to bed feeling well, then woke in the night short of breath.  She used her inhaler with some relief prior to arrival.  She denies to be she is experiencing any chest discomfort, fever, or cough.  The history is provided by the patient.  Shortness of Breath  This is a new problem. The average episode lasts 1 hour. The problem occurs continuously.The problem has been resolved. Pertinent negatives include no fever, no cough, no sputum production, no chest pain, no leg pain and no leg swelling. She has tried nothing for the symptoms.    Past Medical History:  Diagnosis Date  . AAA (abdominal aortic aneurysm) ([place])    a. small by cath 2014.  . Arthritis    "across my hips; buttocks; comes w/the weather" (02/27/2013)  . CAD (coronary artery disease), native coronary artery    a. 10/2012 - rotational atherectomy of LAD c/b dissection s/p PCI to LAD, LCx, LM. b. 12/2012 - s/p overlapping DES to RCA.  . Cataract    "just the beginnings on the right" (02/27/2013)  . Cellulitis 06/20/2012   "dog bite; right forearm" (06/24/2012)  . Chronic bronchitis ([place])    "used to have it alot; haven't had it in a long time" (02/27/2013)  . Chronic combined systolic and diastolic CHF (congestive heart failure) ([place])   . COPD (chronic obstructive pulmonary disease) ([place])    "little bit" (02/27/2013)  . GERD (gastroesophageal reflux disease)   . Hypercholesterolemia   . Ischemic cardiomyopathy    a. EF 45-50%.  . Mass of lung    "small tumor RUL; they  are watching it" (02/27/2013)  . Myocardial infarction [facility])    "think dr said I've had 2 silent one" (02/27/2013)  . Peripheral vascular disease, unspecified ([place])   . Pneumonia 1990's   "once" (02/27/2013)  . Seasonal allergies   . Severe aortic stenosis   . Tachycardia, unspecified   . Tobacco abuse     Patient Active Problem List   Diagnosis Date Noted  . Chronic diastolic CHF (congestive heart failure) ([place]) 12/03/2017  . Dyspnea on exertion 10/27/2017  . Obesity 07/28/2013  . Lung cancer, upper lobe ([place]) 03/31/2013  . Tachycardia, unspecified   . Lung mass 02/27/2013  . Mixed hyperlipidemia 12/26/2012  . Coronary atherosclerosis of native coronary artery 12/26/2012  . Unstable angina ([place])   . CAD (coronary artery disease), native coronary artery   . Peripheral vascular disease, unspecified ([place])   . Nonspecific abnormal unspecified cardiovascular function study   . Cough 08/19/2012  . Solitary pulmonary nodule 06/29/2012  . Pneumonia 06/26/2012  . Dog bite of forearm 06/23/2012  . Cellulitis 06/23/2012  . COPD GOLD II 11/07/2011  . Smoker 11/07/2011  . Multiple pulmonary nodules 08/25/2011  . Aortic stenosis 08/25/2011    Past Surgical History:  Procedure Laterality Date  . CORONARY ANGIOPLASTY WITH STENT PLACEMENT  10/2012; 12/08/2012   "7 + 3" (02/27/2013)  . ESOPHAGOGASTRODUODENOSCOPY (EGD) WITH PROPOFOL N/A 01/11/2015   Procedure:  ESOPHAGOGASTRODUODENOSCOPY (EGD) WITH PROPOFOL;  Surgeon: Garlan Fair, MD;  Location: [facility] ENDOSCOPY;  Service: Endoscopy;  Laterality: N/A;  . FLEXIBLE BRONCHOSCOPY N/A 03/02/2013   Procedure: FLEXIBLE BRONCHOSCOPY;  Surgeon: Gaye Pollack, MD;  Location: [place];  Service: Thoracic;  Laterality: N/A;  . FLEXIBLE SIGMOIDOSCOPY N/A 01/11/2015   Procedure: FLEXIBLE SIGMOIDOSCOPY;  Surgeon: Garlan Fair, MD;  Location: [facility] ENDOSCOPY;  Service: Endoscopy;  Laterality: N/A;  unable to complete colon-prep issues  . GANGLION CYST  EXCISION Left 1975   "wrist"  . LEFT HEART CATH AND CORONARY ANGIOGRAPHY N/A 06/29/2016   Procedure: Left Heart Cath and Coronary Angiography;  Surgeon: Jettie Booze, MD;  Location: [facility] CV LAB;  Service: Cardiovascular;  Laterality: N/A;  . LEFT HEART CATHETERIZATION WITH CORONARY ANGIOGRAM N/A 12/08/2012   Procedure: LEFT HEART CATHETERIZATION WITH CORONARY ANGIOGRAM;  Surgeon: Jettie Booze, MD;  Location: [facility] CATH LAB;  Service: Cardiovascular;  Laterality: N/A;  . MOUTH SURGERY  2010?   "for bone loss" (06/24/2012)  . PERCUTANEOUS CORONARY STENT INTERVENTION (PCI-S) N/A 10/09/2012   Procedure: PERCUTANEOUS CORONARY STENT INTERVENTION (PCI-S);  Surgeon: Jettie Booze, MD;  Location: [facility] CATH LAB;  Service: Cardiovascular;  Laterality: N/A;  . THORACOTOMY/LOBECTOMY Right 03/02/2013   Procedure: Right Video Assisted Thoracoscopy/Thoracotomy with upper Lobectomy;  Surgeon: Gaye Pollack, MD;  Location: [facility] OR;  Service: Thoracic;  Laterality: Right;  Right Lung Upper  Lobectomy   . TONSILLECTOMY  1960  . VIDEO BRONCHOSCOPY  09/12/2011   Procedure: VIDEO BRONCHOSCOPY WITH FLUORO;  Surgeon: Tanda Rockers, MD;  Location: [facility];  Service: Cardiopulmonary;  Laterality: Bilateral;     OB History   None      Home Medications    Prior to Admission medications   Medication Sig Start Date End Date Taking? Authorizing Provider  albuterol (PROAIR HFA) 108 (90 Base) MCG/ACT inhaler Inhale 2 puffs into the lungs every 6 (six) hours as needed for wheezing or shortness of breath.    [provider]  Alirocumab (PRALUENT) 150 MG/ML SOPN Inject 150 mg into the skin every 14 (fourteen) days. 07/31/17   Jettie Booze, MD  cholecalciferol (VITAMIN D) 1000 UNITS tablet Take 1 tablet (1,000 Units total) by mouth at bedtime. 11/26/13   Jettie Booze, MD  clopidogrel (PLAVIX) 75 MG tablet Take 1 tablet (75 mg total) by mouth daily. 11/19/17   Jettie Booze,  MD  diltiazem (CARDIZEM CD) 240 MG 24 hr capsule Take 1 capsule (240 mg total) by mouth daily. 12/17/17   Jettie Booze, MD  fluticasone [facility]) 50 MCG/ACT nasal spray Place into both nostrils at bedtime.  09/23/14   [provider]  furosemide (LASIX) 20 MG tablet Take 1 tablet (20 mg total) by mouth daily. 11/08/17   Jettie Booze, MD  Hypromellose (GENTEAL MILD) 0.2 % SOLN Place 2 drops into both eyes as needed.    [provider]  lansoprazole (PREVACID) 30 MG capsule Take 1 capsule (30 mg total) by mouth daily at 12 noon. 07/18/16   Jettie Booze, MD  NASAL SALINE NA Place 1 spray into the nose daily as needed (congestion).     [provider]  nitroGLYCERIN (NITROSTAT) 0.4 MG SL tablet Place 1 tablet (0.4 mg total) under the tongue every 5 (five) minutes as needed for chest pain. 12/03/17   Imogene Burn, PA-C  OVER THE COUNTER MEDICATION Take 1 tablet by mouth daily with lunch. [facility] Menopause  support    [provider]  potassium chloride SA (K-DUR,KLOR-CON) 20 MEQ tablet Take 0.5 tablets (10 mEq total) by mouth daily. 12/10/17   Imogene Burn, PA-C  ranitidine (ZANTAC) 150 MG tablet Take 150 mg by mouth daily as needed for heartburn.     [provider]  simethicone (MYLICON) 80 MG chewable tablet Chew 160 mg by mouth every 6 (six) hours as needed for flatulence.    [provider]  Tiotropium Bromide-Olodaterol (STIOLTO RESPIMAT) 2.5-2.5 MCG/ACT AERS Inhale 2 puffs into the lungs daily. 12/31/17   Tanda Rockers, MD    Family History Family History  Problem Relation Age of Onset  . Alcohol abuse Father   . Lupus Mother   . Other Sister        Degenerative disc disease  . Migraines Sister   . Pulmonary fibrosis Brother   . Heart attack Neg Hx     Social History Social History   Tobacco Use  . Smoking status: Former Smoker    Packs/day: 0.50    Years: 40.00    Pack years: 20.00    Types: Cigarettes      Last attempt to quit: 10/08/2012    Years since quitting: 5.2  . Smokeless tobacco: Never Used  Substance Use Topics  . Alcohol use: No  . Drug use: No     Allergies   Azithromycin; Ceclor [cefaclor]; Lipitor [atorvastatin]; Morphine and related; Penicillins; Septra [sulfamethoxazole-trimethoprim]; Simvastatin; Vicodin [hydrocodone-acetaminophen]; Zetia [ezetimibe]; Doxycycline; and Adhesive [tape]   Review of Systems Review of Systems  Constitutional: Negative for fever.  Respiratory: Positive for shortness of breath. Negative for cough and sputum production.   Cardiovascular: Negative for chest pain and leg swelling.  All other systems reviewed and are negative.    Physical Exam Updated Vital Signs BP (!) 140/91   Pulse 93   Temp 99.4 F (37.4 C) (Oral)   Resp 17   Ht 4' 11.5" (1.511 m)   Wt 55.8 kg   LMP 03/25/2011   SpO2 100%   BMI 24.43 kg/m   Physical Exam  Constitutional: She is oriented to person, place, and time. She appears well-developed and well-nourished. No distress.  HENT:  Head: Normocephalic and atraumatic.  Neck: Normal range of motion. Neck supple.  Cardiovascular: Normal rate and regular rhythm. Exam reveals no gallop and no friction rub.  No murmur heard. Pulmonary/Chest: Effort normal and breath sounds normal. No respiratory distress. She has no wheezes.  Abdominal: Soft. Bowel sounds are normal. She exhibits no distension. There is no tenderness.  Musculoskeletal: Normal range of motion.       Right lower leg: Normal. She exhibits no edema.       Left lower leg: Normal. She exhibits no edema.  Neurological: She is alert and oriented to person, place, and time.  Skin: Skin is warm and dry. She is not diaphoretic.  Nursing note and vitals reviewed.    ED Treatments / Results  Labs (all labs ordered are listed, but only abnormal results are displayed) Labs Reviewed  BASIC METABOLIC PANEL - Abnormal; Notable for the following components:       Result Value   Chloride 97 (*)    Glucose, Bld 122 (*)    All other components within normal limits  CBC  I-STAT TROPONIN, ED    EKG EKG Interpretation  Date/Time:  Friday January 03 2018 00:16:31 EDT Ventricular Rate:  98 PR Interval:  162 QRS Duration: 86 QT Interval:  352 QTC Calculation: 449 R Axis:   45 Text Interpretation:  Normal sinus rhythm Possible Left atrial enlargement Anterior infarct , age undetermined Abnormal ECG Confirmed by Veryl Speak [phone]) on 01/03/2018 3:59:29 AM   Radiology Dg Chest 2 View  Result Date: 01/03/2018 CLINICAL DATA:  Shortness of breath tonight. EXAM: CHEST - 2 VIEW COMPARISON:  12/16/2017, 10/26/2017 and chest CT 01/02/2018 FINDINGS: Lungs are adequately inflated with mild stable bilateral interstitial prominence. No focal lobar consolidation or effusion. Cardiomediastinal silhouette and remainder of the exam is unchanged. IMPRESSION: Chronic stable interstitial disease. Electronically Signed   By: Marin Olp M.D.   On: 01/03/2018 01:43   Ct Chest W Contrast  Result Date: 01/02/2018 CLINICAL DATA:  Right upper lobe lung cancer with right upper lobectomy 03/02/2013. CHF in August. EXAM: CT CHEST WITH CONTRAST TECHNIQUE: Multidetector CT imaging of the chest was performed during intravenous contrast administration. CONTRAST:  42mL OMNIPAQUE IOHEXOL 300 MG/ML  SOLN COMPARISON:  01/02/2017 CT.  Plain film 12/16/2017 FINDINGS: Cardiovascular: Advanced aortic and branch vessel atherosclerosis. Borderline cardiomegaly, without pericardial effusion. Left atrial enlargement. Multivessel coronary artery atherosclerosis. No central pulmonary embolism, on this non-dedicated study. Mediastinum/Nodes: Small low left jugular/supraclavicular nodes are unchanged. Prevascular nodes of maximally 8 mm, similar. No mediastinal or hilar adenopathy. Lungs/Pleura: Trace right pleural fluid or thickening. Right upper lobectomy. Moderate centrilobular and  paraseptal emphysema. Concurrent subpleural reticulation, without craniocaudal gradient. Upper Abdomen: Normal imaged portions of the liver, spleen, stomach, pancreas, gallbladder, kidneys, adrenal glands. Musculoskeletal: No acute osseous abnormality. IMPRESSION: 1. Status post right upper lobectomy. No findings of recurrent or metastatic disease. 2. Aortic atherosclerosis (ICD10-I70.0), coronary artery atherosclerosis and emphysema (ICD10-J43.9). 3. Suspicion of interstitial lung disease, similar to the prior exam. Question nonspecific interstitial pneumonitis. Electronically Signed   By: Abigail Miyamoto M.D.   On: 01/02/2018 16:01    Procedures Procedures (including critical care time)  Medications Ordered in ED Medications - No data to display   Initial Impression / Assessment and Plan / ED Course  I have reviewed the triage vital signs and the nursing notes.  Pertinent labs & imaging results that were available during my care of the patient were reviewed by me and considered in my medical decision making (see chart for details).  Patient feels much better and oxygen saturations are 94% or greater.  She woke in the night with difficulty breathing and I suspect some sort of bronchospasm related to today's unusual weather.  Her work-up is otherwise unremarkable.  Her EKG is unchanged and troponin is negative.  Chest x-ray shows no evidence for heart failure or pneumonia.  She will be discharged, to follow-up as needed.  She does have an upcoming appointment with pulmonology in the next week or so.  Final Clinical Impressions(s) / ED Diagnoses   Final diagnoses:  None    ED Discharge Orders    None       Veryl Speak, MD 01/03/18 0400

## 2018-01-07 ENCOUNTER — Other Ambulatory Visit: Payer: Self-pay | Admitting: Internal Medicine

## 2018-01-07 DIAGNOSIS — J449 Chronic obstructive pulmonary disease, unspecified: Secondary | ICD-10-CM

## 2018-01-08 ENCOUNTER — Ambulatory Visit (INDEPENDENT_AMBULATORY_CARE_PROVIDER_SITE_OTHER): Payer: Commercial Managed Care - PPO | Admitting: Internal Medicine

## 2018-01-08 DIAGNOSIS — J449 Chronic obstructive pulmonary disease, unspecified: Secondary | ICD-10-CM | POA: Diagnosis not present

## 2018-01-08 LAB — PULMONARY FUNCTION TEST
FEF 25-75 POST: 0.35 L/s
FEF 25-75 PRE: 0.4 L/s
FEF2575-%Change-Post: -13 %
FEF2575-%Pred-Post: 18 %
FEF2575-%Pred-Pre: 21 %
FEV1-%Change-Post: -4 %
FEV1-%PRED-POST: 38 %
FEV1-%PRED-PRE: 40 %
FEV1-POST: 0.78 L
FEV1-PRE: 0.82 L
FEV1FVC-%CHANGE-POST: 2 %
FEV1FVC-%PRED-PRE: 83 %
FEV6-%CHANGE-POST: -7 %
FEV6-%Pred-Post: 46 %
FEV6-%Pred-Pre: 49 %
FEV6-POST: 1.18 L
FEV6-Pre: 1.27 L
FEV6FVC-%Change-Post: 0 %
FEV6FVC-%PRED-POST: 103 %
FEV6FVC-%Pred-Pre: 104 %
FVC-%CHANGE-POST: -6 %
FVC-%PRED-PRE: 47 %
FVC-%Pred-Post: 44 %
FVC-POST: 1.18 L
FVC-PRE: 1.27 L
PRE FEV1/FVC RATIO: 64 %
Post FEV1/FVC ratio: 66 %
Post FEV6/FVC ratio: 100 %
Pre FEV6/FVC Ratio: 100 %
RV % pred: 59 %
RV: 1.09 L
TLC % pred: 57 %
TLC: 2.51 L

## 2018-01-08 NOTE — Progress Notes (Signed)
Patient completed PFT today. Pt was unable to complete the DLCO. She was unable to take a deep breath to pass the test, she tried three times. Pt asked to stop.

## 2018-01-09 ENCOUNTER — Inpatient Hospital Stay: Payer: Commercial Managed Care - PPO | Attending: Internal Medicine | Admitting: Internal Medicine

## 2018-01-09 ENCOUNTER — Encounter: Payer: Self-pay | Admitting: Internal Medicine

## 2018-01-09 ENCOUNTER — Telehealth: Payer: Self-pay | Admitting: Internal Medicine

## 2018-01-09 ENCOUNTER — Other Ambulatory Visit (INDEPENDENT_AMBULATORY_CARE_PROVIDER_SITE_OTHER): Payer: Commercial Managed Care - PPO

## 2018-01-09 ENCOUNTER — Encounter: Payer: Self-pay | Admitting: Pulmonary Disease

## 2018-01-09 ENCOUNTER — Ambulatory Visit: Payer: Commercial Managed Care - PPO | Admitting: Pulmonary Disease

## 2018-01-09 VITALS — BP 128/80 | HR 109 | Temp 97.6°F | Ht 59.5 in | Wt 124.4 lb

## 2018-01-09 VITALS — BP 153/85 | HR 108 | Temp 98.4°F | Resp 18 | Ht 59.0 in | Wt 124.4 lb

## 2018-01-09 DIAGNOSIS — C3411 Malignant neoplasm of upper lobe, right bronchus or lung: Secondary | ICD-10-CM

## 2018-01-09 DIAGNOSIS — R0609 Other forms of dyspnea: Secondary | ICD-10-CM | POA: Diagnosis not present

## 2018-01-09 DIAGNOSIS — I252 Old myocardial infarction: Secondary | ICD-10-CM

## 2018-01-09 DIAGNOSIS — I5042 Chronic combined systolic (congestive) and diastolic (congestive) heart failure: Secondary | ICD-10-CM | POA: Diagnosis not present

## 2018-01-09 DIAGNOSIS — Z79899 Other long term (current) drug therapy: Secondary | ICD-10-CM

## 2018-01-09 DIAGNOSIS — K219 Gastro-esophageal reflux disease without esophagitis: Secondary | ICD-10-CM | POA: Diagnosis not present

## 2018-01-09 DIAGNOSIS — R059 Cough, unspecified: Secondary | ICD-10-CM

## 2018-01-09 DIAGNOSIS — E7800 Pure hypercholesterolemia, unspecified: Secondary | ICD-10-CM

## 2018-01-09 DIAGNOSIS — J449 Chronic obstructive pulmonary disease, unspecified: Secondary | ICD-10-CM | POA: Diagnosis not present

## 2018-01-09 DIAGNOSIS — C3490 Malignant neoplasm of unspecified part of unspecified bronchus or lung: Secondary | ICD-10-CM

## 2018-01-09 DIAGNOSIS — I2510 Atherosclerotic heart disease of native coronary artery without angina pectoris: Secondary | ICD-10-CM

## 2018-01-09 DIAGNOSIS — M1990 Unspecified osteoarthritis, unspecified site: Secondary | ICD-10-CM | POA: Diagnosis not present

## 2018-01-09 DIAGNOSIS — I739 Peripheral vascular disease, unspecified: Secondary | ICD-10-CM | POA: Diagnosis not present

## 2018-01-09 DIAGNOSIS — R05 Cough: Secondary | ICD-10-CM

## 2018-01-09 DIAGNOSIS — R0600 Dyspnea, unspecified: Secondary | ICD-10-CM

## 2018-01-09 DIAGNOSIS — R918 Other nonspecific abnormal finding of lung field: Secondary | ICD-10-CM | POA: Diagnosis not present

## 2018-01-09 LAB — BRAIN NATRIURETIC PEPTIDE: PRO B NATRI PEPTIDE: 2266 pg/mL — AB (ref 0.0–100.0)

## 2018-01-09 MED ORDER — TIOTROPIUM BROMIDE-OLODATEROL 2.5-2.5 MCG/ACT IN AERS: 2.0000 | INHALATION_SPRAY | Freq: Every day | RESPIRATORY_TRACT | 0 refills | Status: AC

## 2018-01-09 MED ORDER — AEROCHAMBER MV MISC: 0 refills | Status: AC

## 2018-01-09 NOTE — Assessment & Plan Note (Signed)
Restart Prevacid and Pepcid GERD management

## 2018-01-09 NOTE — Progress Notes (Signed)
@Patient  ID: Teresa [name], female    DOB: Oct 27, 1950, 67 y.o.   MRN: [id number]  Chief Complaint  Patient presents with  . Acute Visit    review PFT, sob     Referring provider: Leeroy Cha  HPI:  67 year old female former smoker followed in our office for COPD  PMH: Right upper lobectomy, GERD, CHF Smoker/ Smoking History: Former Smoker  Maintenance:  Stiolto Pt of:  Dr. Melvyn Novas   01/09/2018  - Visit   67 year old female patient presenting today for evaluation of pulmonary function test which was completed on 01/21/2018.  Patient is followed in our office for COPD.  Patient has had progressive shortness of breath over the last 6 months.  Over that time patient has been diagnosed with congestive heart failure.  Started on diuretics.  Patient also reports that she is also had an increase globus sensation in her throat.  Patient has been weaning off of acid reflux medications.  Patient denies recent [place] use.  Patient was started on Stiolto last office office visit but unfortunately patient is only been using 1 puff a day.  Patient reports baseline dry cough.  Patient had been weaning off of PPI and Pepcid.  MMRC - Breathlessness Score 2 - on level ground, I walk slower than people of the same age because of breathlessness, or have to stop for breathe when walking to my own pace   Of note, patient's biological brother did have idiopathic pulmonary fibrosis and died at age 81.    Tests:  01-21-18-pulmonary function test-FVC 1.27 (47% predicted), postbronchodilator ratio 66, postbronchodilator FEV1 38, no significant bronchodilator response, patient was unable to complete DLCO >>>Patient did take 1 puff of Stiolto prior to this test  01/02/18-CT chest with contrast- status post right upper lobectomy, no findings of recurrent or metastatic disease, suspicion for interstitial lung disease similar to prior exam, nonspecific interstitial  pneumonitis  11/05/2017-echocardiogram-LV ejection fraction 40 to 54%, grade 2 diastolic dysfunction  FENO:  No results found for: NITRICOXIDE  PFT: PFT Results Latest Ref Rng & Units 01/21/2018  FVC-Pre L 1.27  FVC-Predicted Pre % 47  FVC-Post L 1.18  FVC-Predicted Post % 44  Pre FEV1/FVC % % 64  Post FEV1/FCV % % 66  FEV1-Pre L 0.82  FEV1-Predicted Pre % 40  TLC L 2.51  TLC % Predicted % 57  RV % Predicted % 59    Imaging: Dg Chest 2 View  Result Date: 01/03/2018 CLINICAL DATA:  Shortness of breath tonight. EXAM: CHEST - 2 VIEW COMPARISON:  12/16/2017, 10/26/2017 and chest CT 01/02/2018 FINDINGS: Lungs are adequately inflated with mild stable bilateral interstitial prominence. No focal lobar consolidation or effusion. Cardiomediastinal silhouette and remainder of the exam is unchanged. IMPRESSION: Chronic stable interstitial disease. Electronically Signed   By: Marin Olp M.D.   On: 01/03/2018 01:43   Dg Chest 2 View  Result Date: 12/16/2017 CLINICAL DATA:  Shortness of breath. EXAM: CHEST - 2 VIEW COMPARISON:  10/27/2017 and 10/26/2017 and 06/28/2016 FINDINGS: Postsurgical changes in the right lung with chronic volume loss in the right hemithorax. Stable tenting of the right hemidiaphragm. Coarse interstitial lung markings are similar to the recent comparison examination. Heart size is normal and stable. Atherosclerotic calcifications at the aortic arch. Trachea is midline. Evidence for coronary artery stents. No large pleural effusions. No acute bone abnormality. IMPRESSION: Stable chest radiograph findings with prominent interstitial lung markings. Findings could represent chronic changes versus interstitial edema. Postoperative changes as described. Electronically Signed  By: Markus Daft M.D.   On: 12/16/2017 15:24   Ct Chest W Contrast  Result Date: 01/02/2018 CLINICAL DATA:  Right upper lobe lung cancer with right upper lobectomy 03/02/2013. CHF in August. EXAM: CT CHEST  WITH CONTRAST TECHNIQUE: Multidetector CT imaging of the chest was performed during intravenous contrast administration. CONTRAST:  60mL OMNIPAQUE IOHEXOL 300 MG/ML  SOLN COMPARISON:  01/02/2017 CT.  Plain film 12/16/2017 FINDINGS: Cardiovascular: Advanced aortic and branch vessel atherosclerosis. Borderline cardiomegaly, without pericardial effusion. Left atrial enlargement. Multivessel coronary artery atherosclerosis. No central pulmonary embolism, on this non-dedicated study. Mediastinum/Nodes: Small low left jugular/supraclavicular nodes are unchanged. Prevascular nodes of maximally 8 mm, similar. No mediastinal or hilar adenopathy. Lungs/Pleura: Trace right pleural fluid or thickening. Right upper lobectomy. Moderate centrilobular and paraseptal emphysema. Concurrent subpleural reticulation, without craniocaudal gradient. Upper Abdomen: Normal imaged portions of the liver, spleen, stomach, pancreas, gallbladder, kidneys, adrenal glands. Musculoskeletal: No acute osseous abnormality. IMPRESSION: 1. Status post right upper lobectomy. No findings of recurrent or metastatic disease. 2. Aortic atherosclerosis (ICD10-I70.0), coronary artery atherosclerosis and emphysema (ICD10-J43.9). 3. Suspicion of interstitial lung disease, similar to the prior exam. Question nonspecific interstitial pneumonitis. Electronically Signed   By: Abigail Miyamoto M.D.   On: 01/02/2018 16:01    Chart Review:    Specialty Problems      Pulmonary Problems   Multiple pulmonary nodules    Followed in Pulmonary clinic/ [facility]/ [place]   - CT Chest 08/20/11 1. Increased nodularity of a right upper lobe parenchymal opacity  seen on the prior study is nonspecific and could be post infectious  although neoplasm could create a similar appearance. Short-term  follow-up chest CT in 3 months for PET CT scan recommend for  further evaluation.  2. No change in a small ground-glass nodular opacity in the left  upper lobe. -  09/12/2011 FOB with tbbx RUL > no lesions, not able to access R apex for TBBX - CT 02/01/2012 > overall improved > repeat 08/19/12 > better overall x for the R UL nodule > PET 08/22/2012 > 4.3 only lesion > referred to T surgery        COPD GOLD III B    Followed in Pulmonary clinic/ [facility]/ [place]   - PFT's 11/07/2011 FEV1  0.96 (51%) ratio 50 and no better p B2, DLCO 78% - PFT's  09/02/2012  FEV1 1.38 (64 % ) ratio 66    with DLCO  73 % corrects to 93 % for alv volume   - quit smoking 09/2012 - Spirometry 12/31/2017  FEV1 0.8 (43%)  Ratio 61 p advair 250 try stiolto 2 pffs each am and return for pfts 6 weeks        Pneumonia   Solitary pulmonary nodule    See CT chest 01/03/16  5 mm "RUL" nodule (pt is s/p RULobectomy)      Cough    Followed in Pulmonary clinic/ [facility]/ [place]  - sinus ct 08/19/2012  No acute or chronic sinus fluid. Large right middle turbinate concha bullosa.        Lung mass   Lung cancer, upper lobe (HCC)   Dyspnea on exertion      Allergies  Allergen Reactions  . Azithromycin Shortness Of Breath  . Ceclor [Cefaclor] Shortness Of Breath  . Lipitor [Atorvastatin] Other (See Comments)    Severe muscle aches  . Morphine And Related Other (See Comments)    Terrible headache  . Penicillins Other (See Comments)  Unknown from childhood  . Septra [Sulfamethoxazole-Trimethoprim] Shortness Of Breath and Rash  . Simvastatin Other (See Comments)    Severe muscle aches  . Vicodin [Hydrocodone-Acetaminophen] Nausea And Vomiting  . Zetia [Ezetimibe] Other (See Comments)    Severe stomach pain  . Doxycycline Swelling and Other (See Comments)    Redness on the face  . Adhesive [Tape] Other (See Comments)    Redness and swelling - use paper tape    Immunization History  Administered Date(s) Administered  . Influenza Split 12/04/2010  . Influenza Whole 11/18/2017  . Influenza-Unspecified 01/04/2012, 12/05/2012, 12/17/2013  .  Pneumococcal-Unspecified 11/03/2008    Past Medical History:  Diagnosis Date  . AAA (abdominal aortic aneurysm) ([place])    a. small by cath 2014.  . Arthritis    "across my hips; buttocks; comes w/the weather" (02/27/2013)  . CAD (coronary artery disease), native coronary artery    a. 10/2012 - rotational atherectomy of LAD c/b dissection s/p PCI to LAD, LCx, LM. b. 12/2012 - s/p overlapping DES to RCA.  . Cataract    "just the beginnings on the right" (02/27/2013)  . Cellulitis 06/20/2012   "dog bite; right forearm" (06/24/2012)  . Chronic bronchitis ([place])    "used to have it alot; haven't had it in a long time" (02/27/2013)  . Chronic combined systolic and diastolic CHF (congestive heart failure) ([place])   . COPD (chronic obstructive pulmonary disease) ([place])    "little bit" (02/27/2013)  . GERD (gastroesophageal reflux disease)   . Hypercholesterolemia   . Ischemic cardiomyopathy    a. EF 45-50%.  . Mass of lung    "small tumor RUL; they are watching it" (02/27/2013)  . Myocardial infarction [facility])    "think dr said I've had 2 silent one" (02/27/2013)  . Peripheral vascular disease, unspecified ([place])   . Pneumonia 1990's   "once" (02/27/2013)  . Seasonal allergies   . Severe aortic stenosis   . Tachycardia, unspecified   . Tobacco abuse     Tobacco History: Social History   Tobacco Use  Smoking Status Former Smoker  . Packs/day: 0.50  . Years: 40.00  . Pack years: 20.00  . Types: Cigarettes  . Last attempt to quit: 10/08/2012  . Years since quitting: 5.2  Smokeless Tobacco Never Used   Counseling given: Yes  Continue to not smoke  Outpatient Encounter Medications as of 01/09/2018  Medication Sig  . albuterol (PROAIR HFA) 108 (90 Base) MCG/ACT inhaler Inhale 2 puffs into the lungs every 6 (six) hours as needed for wheezing or shortness of breath.  . Alirocumab (PRALUENT) 150 MG/ML SOPN Inject 150 mg into the skin every 14 (fourteen) days.  . cholecalciferol (VITAMIN D)  1000 UNITS tablet Take 1 tablet (1,000 Units total) by mouth at bedtime.  . clopidogrel (PLAVIX) 75 MG tablet Take 1 tablet (75 mg total) by mouth daily.  [name] diltiazem (CARDIZEM CD) 240 MG 24 hr capsule Take 1 capsule (240 mg total) by mouth daily.  . fluticasone (FLONASE) 50 MCG/ACT nasal spray Place into both nostrils at bedtime.   . furosemide (LASIX) 20 MG tablet Take 1 tablet (20 mg total) by mouth daily.  . Hypromellose (GENTEAL MILD) 0.2 % SOLN Place 2 drops into both eyes as needed.  . lansoprazole (PREVACID) 30 MG capsule Take 1 capsule (30 mg total) by mouth daily at 12 noon.  [name] NASAL SALINE NA Place 1 spray into the nose daily as needed (congestion).   . nitroGLYCERIN (NITROSTAT) 0.4 MG  SL tablet Place 1 tablet (0.4 mg total) under the tongue every 5 (five) minutes as needed for chest pain.  [name] OVER THE COUNTER MEDICATION Take 1 tablet by mouth daily with lunch. [facility] Menopause support  . potassium chloride SA (K-DUR,KLOR-CON) 20 MEQ tablet Take 0.5 tablets (10 mEq total) by mouth daily.  . ranitidine (ZANTAC) 150 MG tablet Take 150 mg by mouth daily as needed for heartburn.   . simethicone (MYLICON) 80 MG chewable tablet Chew 160 mg by mouth every 6 (six) hours as needed for flatulence.  . Tiotropium Bromide-Olodaterol (STIOLTO RESPIMAT) 2.5-2.5 MCG/ACT AERS Inhale 2 puffs into the lungs daily.  [name] Spacer/Aero-Holding Chambers (AEROCHAMBER MV) inhaler Use as instructed  . Tiotropium Bromide-Olodaterol (STIOLTO RESPIMAT) 2.5-2.5 MCG/ACT AERS Inhale 2 puffs into the lungs daily for 1 day.   No facility-administered encounter medications on file as of 01/09/2018.      Review of Systems  Review of Systems  Constitutional: Positive for fatigue. Negative for chills, fever and unexpected weight change.  HENT: Negative for congestion, ear pain, postnasal drip, sinus pressure and sinus pain.   Respiratory: Positive for cough and shortness of breath. Negative for chest tightness and wheezing.    Cardiovascular: Negative for chest pain and palpitations.  Gastrointestinal: Negative for blood in stool, diarrhea, nausea and vomiting.       ?globus sensation   Genitourinary: Negative for dysuria, frequency and urgency.  Musculoskeletal: Negative for arthralgias.  Skin: Negative for color change.  Allergic/Immunologic: Positive for environmental allergies. Negative for food allergies.  Neurological: Negative for dizziness, light-headedness and headaches.  Psychiatric/Behavioral: Negative for dysphoric mood. The patient is not nervous/anxious.   All other systems reviewed and are negative.    Physical Exam  BP 128/80 (BP Location: Left Arm, Cuff Size: Normal)   Pulse (!) 109   Temp 97.6 F (36.4 C) (Oral)   Ht 4' 11.5" (1.511 m)   Wt 124 lb 6.4 oz (56.4 kg)   LMP 03/25/2011   SpO2 90%   BMI 24.71 kg/m   Wt Readings from Last 5 Encounters:  01/09/18 124 lb 6.4 oz (56.4 kg)  01/09/18 124 lb 6.4 oz (56.4 kg)  01/03/18 123 lb (55.8 kg)  12/31/17 120 lb 12.8 oz (54.8 kg)  12/17/17 125 lb (56.7 kg)    Physical Exam  Constitutional: She is oriented to person, place, and time and well-developed, well-nourished, and in no distress. No distress.  HENT:  Head: Normocephalic and atraumatic.  Right Ear: Hearing, tympanic membrane, external ear and ear canal normal.  Left Ear: Hearing, tympanic membrane, external ear and ear canal normal.  Nose: Nose normal. Right sinus exhibits no maxillary sinus tenderness and no frontal sinus tenderness. Left sinus exhibits no maxillary sinus tenderness and no frontal sinus tenderness.  Mouth/Throat: Uvula is midline and oropharynx is clear and moist. No oropharyngeal exudate.  Eyes: Pupils are equal, round, and reactive to light.  Neck: Normal range of motion. Neck supple. No JVD present.  Cardiovascular: Regular rhythm and normal heart sounds. Tachycardia present.  Pulmonary/Chest: Effort normal. No accessory muscle usage. No respiratory  distress. She has no decreased breath sounds. She has no wheezes. She has no rhonchi. She has rales in the left middle field.  Abdominal: Soft. Bowel sounds are normal. There is no tenderness.  Musculoskeletal: Normal range of motion. She exhibits no edema.  Lymphadenopathy:    She has no cervical adenopathy.  Neurological: She is alert and oriented to person, place, and time. Gait normal.  Skin: Skin is warm and dry. She is not diaphoretic. No erythema.  Psychiatric: Mood, memory, affect and judgment normal.  Nursing note and vitals reviewed.   Walk in office today patient had a 3% drop in oxygen saturation as well as increased heart rate.  Still remained above 94% on room air.  Lab Results:  CBC    Component Value Date/Time   WBC 7.8 01/03/2018 0031   RBC 4.53 01/03/2018 0031   HGB 12.6 01/03/2018 0031   HGB 13.9 01/02/2017 0737   HCT 41.1 01/03/2018 0031   HCT 42.0 01/02/2017 0737   PLT 297 01/03/2018 0031   PLT 264 01/02/2017 0737   MCV 90.7 01/03/2018 0031   MCV 89.2 01/02/2017 0737   MCH 27.8 01/03/2018 0031   MCHC 30.7 01/03/2018 0031   RDW 13.0 01/03/2018 0031   RDW 13.8 01/02/2017 0737   LYMPHSABS 1.7 01/02/2018 1046   LYMPHSABS 1.9 01/02/2017 0737   MONOABS 0.6 01/02/2018 1046   MONOABS 0.5 01/02/2017 0737   EOSABS 0.1 01/02/2018 1046   EOSABS 0.2 01/02/2017 0737   BASOSABS 0.0 01/02/2018 1046   BASOSABS 0.1 01/02/2017 0737    BMET    Component Value Date/Time   NA 137 01/03/2018 0031   NA 140 12/06/2017 1443   NA 140 01/02/2017 0737   K 3.9 01/03/2018 0031   K 4.4 01/02/2017 0737   CL 97 (L) 01/03/2018 0031   CO2 29 01/03/2018 0031   CO2 29 01/02/2017 0737   GLUCOSE 122 (H) 01/03/2018 0031   GLUCOSE 81 01/02/2017 0737   BUN 15 01/03/2018 0031   BUN 22 12/06/2017 1443   BUN 17.2 01/02/2017 0737   CREATININE 0.79 01/03/2018 0031   CREATININE 0.7 01/02/2017 0737   CALCIUM 9.3 01/03/2018 0031   CALCIUM 9.7 01/02/2017 0737   GFRNONAA >60 01/03/2018  0031   GFRAA >60 01/03/2018 0031    BNP    Component Value Date/Time   BNP 503.8 (H) 10/27/2017 1218    ProBNP No results found for: PROBNP    Assessment & Plan:   Pleasant 67 year old female patient completing follow-up visit today.  We reviewed pulmonary function test results.  Walk today in office does show slight dip in oxygen saturations as well as tachycardia.  Reviewed CT results with Dr. Melvyn Novas today in office visit.  Does not show cranial cardial gradient.  Also mainly groundglass with no honeycombing.  Not definitive for IPF or UIP.  Nonspecific imaging findings.  Will order 6-minute walk for patient to complete at December office visit.  Could consider further work-up and imaging in the future with a high-res CT if symptoms persist.  Patient's brother did die of idiopathic pulmonary fibrosis at age 92.  Will increase Stiolto to 2 puffs daily.  Patient given a spacer today to use.  Patient also to follow-up if symptoms are not improving.  Patient with globus sensation as well as dry cough.  Suspect her GERD is flaring.  Patient had been tapering off of her acid reflux medications.  We will have patient restart Prevacid.  Due to shortness of breath especially when lying flat we will get BNP today.  COPD GOLD III B  Stiolto Respimat inhaler >>>2 puffs daily >>>Take this no matter what >>>This is not a rescue inhaler >>> Use spacer >>> Sample provided today  Only use your albuterol as a rescue medication to be used if you can't catch your breath by resting or doing a relaxed purse lip breathing pattern.  -  The less you use it, the better it will work when you need it. - Ok to use up to 2 puffs  every 4 hours if you must but call for immediate appointment if use goes up over your usual need - Don't leave home without it !!  (think of it like the spare tire for your car)    Note your daily symptoms > remember "red flags" for COPD:   >>>Increase in cough >>>increase in sputum  production >>>increase in shortness of breath or activity  intolerance.   If you notice these symptoms, please call the office to be seen.   Follow-up with Dr. Melvyn Novas in December /2019    Dyspnea on exertion Walk today in office  Lab today  >>>BNP    Could consider high-res CT in the future   Abnormal finding on lung imaging Consider High res CT in the future  39min walk at next office visit   Cough Restart Prevacid and Pepcid GERD management     Lauraine Rinne, NP 01/09/2018

## 2018-01-09 NOTE — Assessment & Plan Note (Addendum)
Consider High res CT in the future  40min walk at next office visit

## 2018-01-09 NOTE — Assessment & Plan Note (Signed)
  Stiolto Respimat inhaler >>>2 puffs daily >>>Take this no matter what >>>This is not a rescue inhaler >>> Use spacer >>> Sample provided today  Only use your albuterol as a rescue medication to be used if you can't catch your breath by resting or doing a relaxed purse lip breathing pattern.  - The less you use it, the better it will work when you need it. - Ok to use up to 2 puffs  every 4 hours if you must but call for immediate appointment if use goes up over your usual need - Don't leave home without it !!  (think of it like the spare tire for your car)    Note your daily symptoms > remember "red flags" for COPD:   >>>Increase in cough >>>increase in sputum production >>>increase in shortness of breath or activity  intolerance.   If you notice these symptoms, please call the office to be seen.   Follow-up with Dr. Melvyn Novas in December /2019

## 2018-01-09 NOTE — Progress Notes (Signed)
[place] [phone]   [phone]  OFFICE PROGRESS NOTE  Leeroy Cha, MD [address] [place] [address]  DIAGNOSIS: Stage IB (T2a., N0, M0) poorly differentiated squamous cell carcinoma, diagnosed in December of 2014.  PRIOR THERAPY: Flexible video bronchoscopy, right upper lobectomy with mediastinal lymph node dissection under the care of Dr. Cyndia Bent.   CURRENT THERAPY: Observation.  INTERVAL HISTORY: Teresa [name] 67 y.o. female returns to the clinic today for follow-up visit accompanied by her sister-in-law.  The patient is feeling fine today with no concerning complaints except for intermittent shortness of breath.  She was seen in the hospital recently and treated for congestive heart failure.  She also seen by her pulmonologist Dr. Melvyn Novas and she underwent pulmonary function test yesterday.  She is seeing him later today for evaluation and discussion of the results.  The patient denied having any chest pain, cough or hemoptysis.  She denied having any recent weight loss or night sweats.  She has no nausea, vomiting, diarrhea or constipation.  She had a repeat CT scan of the chest performed recently and she is here for evaluation and discussion of her risk her results.  MEDICAL HISTORY: Past Medical History:  Diagnosis Date  . AAA (abdominal aortic aneurysm) ([place])    a. small by cath 2014.  . Arthritis    "across my hips; buttocks; comes w/the weather" (02/27/2013)  . CAD (coronary artery disease), native coronary artery    a. 10/2012 - rotational atherectomy of LAD c/b dissection s/p PCI to LAD, LCx, LM. b. 12/2012 - s/p overlapping DES to RCA.  . Cataract    "just the beginnings on the right" (02/27/2013)  . Cellulitis 06/20/2012   "dog bite; right forearm" (06/24/2012)  . Chronic bronchitis ([place])    "used to have it alot; haven't had it in a long time" (02/27/2013)  . Chronic combined systolic and diastolic  CHF (congestive heart failure) ([place])   . COPD (chronic obstructive pulmonary disease) ([place])    "little bit" (02/27/2013)  . GERD (gastroesophageal reflux disease)   . Hypercholesterolemia   . Ischemic cardiomyopathy    a. EF 45-50%.  . Mass of lung    "small tumor RUL; they are watching it" (02/27/2013)  . Myocardial infarction [facility])    "think dr said I've had 2 silent one" (02/27/2013)  . Peripheral vascular disease, unspecified ([place])   . Pneumonia 1990's   "once" (02/27/2013)  . Seasonal allergies   . Severe aortic stenosis   . Tachycardia, unspecified   . Tobacco abuse     ALLERGIES:  is allergic to azithromycin; ceclor [cefaclor]; lipitor [atorvastatin]; morphine and related; penicillins; septra [sulfamethoxazole-trimethoprim]; simvastatin; vicodin [hydrocodone-acetaminophen]; zetia [ezetimibe]; doxycycline; and adhesive [tape].  MEDICATIONS:  Current Outpatient Medications  Medication Sig Dispense Refill  . albuterol (PROAIR HFA) 108 (90 Base) MCG/ACT inhaler Inhale 2 puffs into the lungs every 6 (six) hours as needed for wheezing or shortness of breath.    . Alirocumab (PRALUENT) 150 MG/ML SOPN Inject 150 mg into the skin every 14 (fourteen) days. 2 pen 11  . cholecalciferol (VITAMIN D) 1000 UNITS tablet Take 1 tablet (1,000 Units total) by mouth at bedtime. 90 tablet 0  . clopidogrel (PLAVIX) 75 MG tablet Take 1 tablet (75 mg total) by mouth daily. 90 tablet 2  . diltiazem (CARDIZEM CD) 240 MG 24 hr capsule Take 1 capsule (240 mg total) by mouth daily. 90 capsule 3  .  fluticasone (FLONASE) 50 MCG/ACT nasal spray Place into both nostrils at bedtime.   4  . furosemide (LASIX) 20 MG tablet Take 1 tablet (20 mg total) by mouth daily. 90 tablet 1  . Hypromellose (GENTEAL MILD) 0.2 % SOLN Place 2 drops into both eyes as needed.    . lansoprazole (PREVACID) 30 MG capsule Take 1 capsule (30 mg total) by mouth daily at 12 noon. 90 capsule 3  . NASAL SALINE NA Place 1 spray into the  nose daily as needed (congestion).     . nitroGLYCERIN (NITROSTAT) 0.4 MG SL tablet Place 1 tablet (0.4 mg total) under the tongue every 5 (five) minutes as needed for chest pain. 25 tablet 4  . OVER THE COUNTER MEDICATION Take 1 tablet by mouth daily with lunch. [facility] Menopause support    . potassium chloride SA (K-DUR,KLOR-CON) 20 MEQ tablet Take 0.5 tablets (10 mEq total) by mouth daily. 90 tablet 1  . ranitidine (ZANTAC) 150 MG tablet Take 150 mg by mouth daily as needed for heartburn.     . simethicone (MYLICON) 80 MG chewable tablet Chew 160 mg by mouth every 6 (six) hours as needed for flatulence.    . Tiotropium Bromide-Olodaterol (STIOLTO RESPIMAT) 2.5-2.5 MCG/ACT AERS Inhale 2 puffs into the lungs daily. 1 Inhaler 11   No current facility-administered medications for this visit.     SURGICAL HISTORY:  Past Surgical History:  Procedure Laterality Date  . CORONARY ANGIOPLASTY WITH STENT PLACEMENT  10/2012; 12/08/2012   "7 + 3" (02/27/2013)  . ESOPHAGOGASTRODUODENOSCOPY (EGD) WITH PROPOFOL N/A 01/11/2015   Procedure: ESOPHAGOGASTRODUODENOSCOPY (EGD) WITH PROPOFOL;  Surgeon: Garlan Fair, MD;  Location: [facility];  Service: Endoscopy;  Laterality: N/A;  . FLEXIBLE BRONCHOSCOPY N/A 03/02/2013   Procedure: FLEXIBLE BRONCHOSCOPY;  Surgeon: Gaye Pollack, MD;  Location: [facility];  Service: Thoracic;  Laterality: N/A;  . FLEXIBLE SIGMOIDOSCOPY N/A 01/11/2015   Procedure: FLEXIBLE SIGMOIDOSCOPY;  Surgeon: Garlan Fair, MD;  Location: [facility];  Service: Endoscopy;  Laterality: N/A;  unable to complete colon-prep issues  . GANGLION CYST EXCISION Left 1975   "wrist"  . LEFT HEART CATH AND CORONARY ANGIOGRAPHY N/A 06/29/2016   Procedure: Left Heart Cath and Coronary Angiography;  Surgeon: Jettie Booze, MD;  Location: [facility] CV LAB;  Service: Cardiovascular;  Laterality: N/A;  . LEFT HEART CATHETERIZATION WITH CORONARY ANGIOGRAM N/A 12/08/2012   Procedure: LEFT HEART  CATHETERIZATION WITH CORONARY ANGIOGRAM;  Surgeon: Jettie Booze, MD;  Location: [facility] CATH LAB;  Service: Cardiovascular;  Laterality: N/A;  . MOUTH SURGERY  2010?   "for bone loss" (06/24/2012)  . PERCUTANEOUS CORONARY STENT INTERVENTION (PCI-S) N/A 10/09/2012   Procedure: PERCUTANEOUS CORONARY STENT INTERVENTION (PCI-S);  Surgeon: Jettie Booze, MD;  Location: [facility] CATH LAB;  Service: Cardiovascular;  Laterality: N/A;  . THORACOTOMY/LOBECTOMY Right 03/02/2013   Procedure: Right Video Assisted Thoracoscopy/Thoracotomy with upper Lobectomy;  Surgeon: Gaye Pollack, MD;  Location: [facility] OR;  Service: Thoracic;  Laterality: Right;  Right Lung Upper  Lobectomy   . TONSILLECTOMY  1960  . VIDEO BRONCHOSCOPY  09/12/2011   Procedure: VIDEO BRONCHOSCOPY WITH FLUORO;  Surgeon: Tanda Rockers, MD;  Location: [facility];  Service: Cardiopulmonary;  Laterality: Bilateral;    REVIEW OF SYSTEMS:  A comprehensive review of systems was negative except for: Respiratory: positive for dyspnea on exertion   PHYSICAL EXAMINATION: General appearance: alert, cooperative and no distress Head: Normocephalic, without obvious abnormality, atraumatic Neck: no adenopathy, no JVD,  supple, symmetrical, trachea midline and thyroid not enlarged, symmetric, no tenderness/mass/nodules Lymph nodes: Cervical, supraclavicular, and axillary nodes normal. Resp: clear to auscultation bilaterally Back: symmetric, no curvature. ROM normal. No CVA tenderness. Cardio: regular rate and rhythm, S1, S2 normal, no murmur, click, rub or gallop GI: soft, non-tender; bowel sounds normal; no masses,  no organomegaly Extremities: extremities normal, atraumatic, no cyanosis or edema  ECOG PERFORMANCE STATUS: 1 - Symptomatic but completely ambulatory  Blood pressure (!) 153/85, pulse (!) 108, temperature 98.4 F (36.9 C), temperature source Oral, resp. rate 18, height 4\' 11"  (1.499 m), weight 124 lb 6.4 oz (56.4 kg), last menstrual period  03/25/2011, SpO2 96 %.  LABORATORY DATA: Lab Results  Component Value Date   WBC 7.8 01/03/2018   HGB 12.6 01/03/2018   HCT 41.1 01/03/2018   MCV 90.7 01/03/2018   PLT 297 01/03/2018      Chemistry      Component Value Date/Time   NA 137 01/03/2018 0031   NA 140 12/06/2017 1443   NA 140 01/02/2017 0737   K 3.9 01/03/2018 0031   K 4.4 01/02/2017 0737   CL 97 (L) 01/03/2018 0031   CO2 29 01/03/2018 0031   CO2 29 01/02/2017 0737   BUN 15 01/03/2018 0031   BUN 22 12/06/2017 1443   BUN 17.2 01/02/2017 0737   CREATININE 0.79 01/03/2018 0031   CREATININE 0.7 01/02/2017 0737      Component Value Date/Time   CALCIUM 9.3 01/03/2018 0031   CALCIUM 9.7 01/02/2017 0737   ALKPHOS 84 01/02/2018 1046   ALKPHOS 82 01/02/2017 0737   AST 14 (L) 01/02/2018 1046   AST 16 01/02/2017 0737   ALT 10 01/02/2018 1046   ALT 13 01/02/2017 0737   BILITOT 0.8 01/02/2018 1046   BILITOT 0.76 01/02/2017 0737       RADIOGRAPHIC STUDIES: Dg Chest 2 View  Result Date: 01/03/2018 CLINICAL DATA:  Shortness of breath tonight. EXAM: CHEST - 2 VIEW COMPARISON:  12/16/2017, 10/26/2017 and chest CT 01/02/2018 FINDINGS: Lungs are adequately inflated with mild stable bilateral interstitial prominence. No focal lobar consolidation or effusion. Cardiomediastinal silhouette and remainder of the exam is unchanged. IMPRESSION: Chronic stable interstitial disease. Electronically Signed   By: Marin Olp M.D.   On: 01/03/2018 01:43   Dg Chest 2 View  Result Date: 12/16/2017 CLINICAL DATA:  Shortness of breath. EXAM: CHEST - 2 VIEW COMPARISON:  10/27/2017 and 10/26/2017 and 06/28/2016 FINDINGS: Postsurgical changes in the right lung with chronic volume loss in the right hemithorax. Stable tenting of the right hemidiaphragm. Coarse interstitial lung markings are similar to the recent comparison examination. Heart size is normal and stable. Atherosclerotic calcifications at the aortic arch. Trachea is midline.  Evidence for coronary artery stents. No large pleural effusions. No acute bone abnormality. IMPRESSION: Stable chest radiograph findings with prominent interstitial lung markings. Findings could represent chronic changes versus interstitial edema. Postoperative changes as described. Electronically Signed   By: Markus Daft M.D.   On: 12/16/2017 15:24   Ct Chest W Contrast  Result Date: 01/02/2018 CLINICAL DATA:  Right upper lobe lung cancer with right upper lobectomy 03/02/2013. CHF in August. EXAM: CT CHEST WITH CONTRAST TECHNIQUE: Multidetector CT imaging of the chest was performed during intravenous contrast administration. CONTRAST:  93mL OMNIPAQUE IOHEXOL 300 MG/ML  SOLN COMPARISON:  01/02/2017 CT.  Plain film 12/16/2017 FINDINGS: Cardiovascular: Advanced aortic and branch vessel atherosclerosis. Borderline cardiomegaly, without pericardial effusion. Left atrial enlargement. Multivessel coronary artery atherosclerosis. No central  pulmonary embolism, on this non-dedicated study. Mediastinum/Nodes: Small low left jugular/supraclavicular nodes are unchanged. Prevascular nodes of maximally 8 mm, similar. No mediastinal or hilar adenopathy. Lungs/Pleura: Trace right pleural fluid or thickening. Right upper lobectomy. Moderate centrilobular and paraseptal emphysema. Concurrent subpleural reticulation, without craniocaudal gradient. Upper Abdomen: Normal imaged portions of the liver, spleen, stomach, pancreas, gallbladder, kidneys, adrenal glands. Musculoskeletal: No acute osseous abnormality. IMPRESSION: 1. Status post right upper lobectomy. No findings of recurrent or metastatic disease. 2. Aortic atherosclerosis (ICD10-I70.0), coronary artery atherosclerosis and emphysema (ICD10-J43.9). 3. Suspicion of interstitial lung disease, similar to the prior exam. Question nonspecific interstitial pneumonitis. Electronically Signed   By: Abigail Miyamoto M.D.   On: 01/02/2018 16:01   ASSESSMENT AND PLAN: this is a very  pleasant 67 years old white female with history of a stage IB non-small cell lung cancer status post right upper lobectomy with lymph node dissection. The patient has been in observation for 5 years now. Repeat CT scan of the chest performed recently showed no concerning findings for disease recurrence or metastasis. I discussed the scan results with the patient recommended for her to continue on observation with repeat CT scan of the chest in 1 year. For the shortness of breath she is followed by Dr. Melvyn Novas and she will see him later today for discussion of her pulmonary function test. The patient was advised to call immediately if she has any concerning symptoms in the interval. The patient voices understanding of current disease status and treatment options and is in agreement with the current care plan.  All questions were answered. The patient knows to call the clinic with any problems, questions or concerns. We can certainly see the patient much sooner if necessary.  Disclaimer: This note was dictated with voice recognition software. Similar sounding words can inadvertently be transcribed and may not be corrected upon review.

## 2018-01-09 NOTE — Telephone Encounter (Signed)
Appts scheduled avs/calendar printed per 11/7 los

## 2018-01-09 NOTE — Patient Instructions (Addendum)
Walk today in office  Lab today  >>>BNP    Could consider high-res CT in the future  Stiolto Respimat inhaler >>>2 puffs daily >>>Take this no matter what >>>This is not a rescue inhaler >>> Use spacer >>> Sample provided today  Only use your albuterol as a rescue medication to be used if you can't catch your breath by resting or doing a relaxed purse lip breathing pattern.  - The less you use it, the better it will work when you need it. - Ok to use up to 2 puffs  every 4 hours if you must but call for immediate appointment if use goes up over your usual need - Don't leave home without it !!  (think of it like the spare tire for your car)    Note your daily symptoms > remember "red flags" for COPD:   >>>Increase in cough >>>increase in sputum production >>>increase in shortness of breath or activity  intolerance.   If you notice these symptoms, please call the office to be seen.    Follow-up with Dr. Melvyn Novas in December /2019      November/2019 we will be moving! We will no longer be at our [place] location.  Be on the look out for a post card/mailer to let you know we have officially moved.  Our new address and phone number will be:  [address] [place], [place] [address] Telephone number: [phone]  It is flu season:   >>>Remember to be washing your hands regularly, using hand sanitizer, be careful to use around herself with has contact with people who are sick will increase her chances of getting sick yourself. >>> Best ways to protect herself from the flu: Receive the yearly flu vaccine, practice good hand hygiene washing with soap and also using hand sanitizer when available, eat a nutritious meals, get adequate rest, hydrate appropriately   Please contact the office if your symptoms worsen or you have concerns that you are not improving.   Thank you for choosing [facility] for your healthcare, and for allowing [place] to partner with you on your  healthcare journey. I am thankful to be able to provide care to you today.   Wyn Quaker FNP-C

## 2018-01-09 NOTE — Assessment & Plan Note (Signed)
Walk today in office  Lab today  >>>BNP    Could consider high-res CT in the future

## 2018-01-10 NOTE — Progress Notes (Signed)
Your lab test from yesterday is showing that your holding onto fluid.  It is significantly elevated.  I would like for you to follow-up with cardiology and let them know.  For the time being but increase your Lasix to two 20 mg tablets (total of 40 mg) for the next 4 days (today, Saturday, Sunday, and Monday). Then resume 20mg  daily.  You could take them together in the morning.  Avoid high salt foods.  Elevate legs.  Continue to monitor daily weights.  Contact cardiology or primary care for follow-up so they can monitor your fluid.  Keep follow-up with our office.  Wyn Quaker, FNP

## 2018-01-13 ENCOUNTER — Telehealth: Payer: Self-pay | Admitting: Interventional Cardiology

## 2018-01-13 ENCOUNTER — Telehealth: Payer: Self-pay | Admitting: Pulmonary Disease

## 2018-01-13 NOTE — Telephone Encounter (Signed)
New Message   [facility]) Patient is calling because she recently saw pulmonary care. They advised her to make an appt with her cardiologist to check her fluids. They advised that they were going to send the results of her labs over. She did schedule to come in on 11/14.

## 2018-01-13 NOTE — Progress Notes (Signed)
Thanks for the detailed follow up.   Teresa [name]

## 2018-01-13 NOTE — Telephone Encounter (Signed)
BNP increased. She can increase Lasix to 40 mg BID until she is seen in the office on 11/14.  Double potassium.  Will check a BMet at that time.    She has evidence of moderate COPD which is also contributing to her [facility].

## 2018-01-13 NOTE — Telephone Encounter (Signed)
Called and spoke to patient, mae aware of lab results and recommendations. Had patient repeat back to me what dosage of lasix she should be taking and how long. Patient states she will call her cardiologist to get a appointment this week. Labs routed to cardiologist. Patient expressed thanks. Nothing further is needed at this time.

## 2018-01-14 ENCOUNTER — Telehealth: Payer: Self-pay

## 2018-01-14 MED ORDER — FUROSEMIDE 40 MG PO TABS: 40.0000 mg | ORAL_TABLET | Freq: Two times a day (BID) | ORAL | 0 refills | Status: DC

## 2018-01-14 NOTE — Telephone Encounter (Signed)
Noted  

## 2018-01-14 NOTE — Telephone Encounter (Signed)
-----   Message from Jettie Booze, MD sent at 01/13/2018  6:30 PM EST -----   ----- Message ----- From: Vivia Ewing, LPN Sent: 09/38/1829  10:52 AM EST To: Jettie Booze, MD  Patient seen in office last week, here is a copy of her labs. See recommendations below. Patient has been contacted.

## 2018-01-14 NOTE — Telephone Encounter (Signed)
Called and made patient aware of recommendations take lasix 40 mg BID and double up on her potassium. Instructed patient to keep her appointment on Thursday and we will check a BMET at that time. Patient verbalized understanding and thanked me for the call.

## 2018-01-14 NOTE — Telephone Encounter (Signed)
Editor: Jettie Booze, MD (Physician)     BNP increased. She can increase Lasix to 40 mg BID until she is seen in the office on 11/14.  Double potassium.  Will check a BMet at that time.    She has evidence of moderate COPD which is also contributing to her [facility].

## 2018-01-14 NOTE — Telephone Encounter (Signed)
Left message for patient to call back  

## 2018-01-14 NOTE — Telephone Encounter (Signed)
Follow up  ° ° °Patient is returning your call. °

## 2018-01-15 NOTE — Progress Notes (Signed)
Cardiology Office Note   Date:  01/16/2018   ID:  [name], Teresa 1951-03-04, MRN [id number]  PCP:  Leeroy Cha, MD    No chief complaint on file.  CAD/SHOB  Wt Readings from Last 3 Encounters:  01/16/18 117 lb 9.6 oz (53.3 kg)  01/09/18 124 lb 6.4 oz (56.4 kg)  01/09/18 124 lb 6.4 oz (56.4 kg)       History of Present Illness: Teresa [name] is a 67 y.o. female  with CADwho has had a complex left main , circumflex , LAD stenting after a dissection during PCI in 8/14. She had PCI of the RCA complicated by dissection and required 3 stents.Last cath 2018 patent stents in the left main, LAD circumflex and RCA mild aortic valve stenosis LVEF 45 to 50%. She had successful lung surgery to remove a malignancy in 12/14.Quit smoking in 2014.  Patient was hospitalized 10/2017 with CHF treated with IV Lasix. Troponins were negative. 2D echo 11/05/2017 showed LVEF is 40 to 45% with grade 2 DD, severe aortic stenosis mean gradient of 45 mmHg although there was only mild aortic valve stenosis on cardiac cath 06/29/2016.  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 She was started on steroids and albuterol for bronchitis.  Diltiazem started for tachycardia in 10/19.  She had PFTs and some improvement in sx.  SHe went back to work.  She then had some more shortness of breath in early November.  BNP was elevated.  She had been treated with several rounds of prednisone.  After having her diuretics increased, she feels much better.  She presents today and notes that she has lost 7 pounds in the last week.  It is much easier to breathe at night when she is lying down.  She had also been started on an inhaler for moderate COPD.    Past Medical History:  Diagnosis Date  . AAA (abdominal aortic aneurysm) ([place])    a. small by cath 2014.  .  Arthritis    "across my hips; buttocks; comes w/the weather" (02/27/2013)  . CAD (coronary artery disease), native coronary artery    a. 10/2012 - rotational atherectomy of LAD c/b dissection s/p PCI to LAD, LCx, LM. b. 12/2012 - s/p overlapping DES to RCA.  . Cataract    "just the beginnings on the right" (02/27/2013)  . Cellulitis 06/20/2012   "dog bite; right forearm" (06/24/2012)  . Chronic bronchitis ([place])    "used to have it alot; haven't had it in a long time" (02/27/2013)  . Chronic combined systolic and diastolic CHF (congestive heart failure) ([place])   . COPD (chronic obstructive pulmonary disease) ([place])    "little bit" (02/27/2013)  . GERD (gastroesophageal reflux disease)   . Hypercholesterolemia   . Ischemic cardiomyopathy    a. EF 45-50%.  . Mass of lung    "small tumor RUL; they are watching it" (02/27/2013)  . Myocardial infarction [facility])    "think dr said I've had 2 silent one" (02/27/2013)  . Peripheral vascular disease, unspecified ([place])   . Pneumonia 1990's   "once" (02/27/2013)  . Seasonal allergies   . Severe aortic stenosis   . Tachycardia, unspecified   . Tobacco abuse     Past Surgical History:  Procedure Laterality Date  . CORONARY ANGIOPLASTY WITH STENT PLACEMENT  10/2012; 12/08/2012   "7 +  3" (02/27/2013)  . ESOPHAGOGASTRODUODENOSCOPY (EGD) WITH PROPOFOL N/A 01/11/2015   Procedure: ESOPHAGOGASTRODUODENOSCOPY (EGD) WITH PROPOFOL;  Surgeon: Garlan Fair, MD;  Location: [facility];  Service: Endoscopy;  Laterality: N/A;  . FLEXIBLE BRONCHOSCOPY N/A 03/02/2013   Procedure: FLEXIBLE BRONCHOSCOPY;  Surgeon: Gaye Pollack, MD;  Location: [place];  Service: Thoracic;  Laterality: N/A;  . FLEXIBLE SIGMOIDOSCOPY N/A 01/11/2015   Procedure: FLEXIBLE SIGMOIDOSCOPY;  Surgeon: Garlan Fair, MD;  Location: [facility];  Service: Endoscopy;  Laterality: N/A;  unable to complete colon-prep issues  . GANGLION CYST EXCISION Left 1975   "wrist"  . LEFT HEART CATH  AND CORONARY ANGIOGRAPHY N/A 06/29/2016   Procedure: Left Heart Cath and Coronary Angiography;  Surgeon: Jettie Booze, MD;  Location: [facility] CV LAB;  Service: Cardiovascular;  Laterality: N/A;  . LEFT HEART CATHETERIZATION WITH CORONARY ANGIOGRAM N/A 12/08/2012   Procedure: LEFT HEART CATHETERIZATION WITH CORONARY ANGIOGRAM;  Surgeon: Jettie Booze, MD;  Location[facility] CATH LAB;  Service: Cardiovascular;  Laterality: N/A;  . MOUTH SURGERY  2010?   "for bone loss" (06/24/2012)  . PERCUTANEOUS CORONARY STENT INTERVENTION (PCI-S) N/A 10/09/2012   Procedure: PERCUTANEOUS CORONARY STENT INTERVENTION (PCI-S);  Surgeon: Jettie Booze, MD;  Location: [facility] CATH LAB;  Service: Cardiovascular;  Laterality: N/A;  . THORACOTOMY/LOBECTOMY Right 03/02/2013   Procedure: Right Video Assisted Thoracoscopy/Thoracotomy with upper Lobectomy;  Surgeon: Gaye Pollack, MD;  Location: [facility] OR;  Service: Thoracic;  Laterality: Right;  Right Lung Upper  Lobectomy   . TONSILLECTOMY  1960  . VIDEO BRONCHOSCOPY  09/12/2011   Procedure: VIDEO BRONCHOSCOPY WITH FLUORO;  Surgeon: Tanda Rockers, MD;  Location: [facility];  Service: Cardiopulmonary;  Laterality: Bilateral;     Current Outpatient Medications  Medication Sig Dispense Refill  . albuterol (PROAIR HFA) 108 (90 Base) MCG/ACT inhaler Inhale 2 puffs into the lungs every 6 (six) hours as needed for wheezing or shortness of breath.    . Alirocumab (PRALUENT) 150 MG/ML SOPN Inject 150 mg into the skin every 14 (fourteen) days. 2 pen 11  . cholecalciferol (VITAMIN D) 1000 UNITS tablet Take 1 tablet (1,000 Units total) by mouth at bedtime. 90 tablet 0  . clopidogrel (PLAVIX) 75 MG tablet Take 1 tablet (75 mg total) by mouth daily. 90 tablet 2  . diltiazem (CARDIZEM CD) 240 MG 24 hr capsule Take 1 capsule (240 mg total) by mouth daily. 90 capsule 3  . fluticasone (FLONASE) 50 MCG/ACT nasal spray Place into both nostrils at bedtime.   4  . furosemide (LASIX)  40 MG tablet Take 1 tablet (40 mg total) by mouth 2 (two) times daily. 60 tablet 0  . Hypromellose (GENTEAL MILD) 0.2 % SOLN Place 2 drops into both eyes as needed.    . lansoprazole (PREVACID) 30 MG capsule Take 1 capsule (30 mg total) by mouth daily at 12 noon. 90 capsule 3  . NASAL SALINE NA Place 1 spray into the nose daily as needed (congestion).     . nitroGLYCERIN (NITROSTAT) 0.4 MG SL tablet Place 1 tablet (0.4 mg total) under the tongue every 5 (five) minutes as needed for chest pain. 25 tablet 4  . OVER THE COUNTER MEDICATION Take 1 tablet by mouth daily with lunch. [facility] Menopause support    . potassium chloride SA (K-DUR,KLOR-CON) 20 MEQ tablet Take 0.5 tablets (10 mEq total) by mouth daily. 90 tablet 1  . ranitidine (ZANTAC) 150 MG tablet Take 150 mg by mouth daily as  needed for heartburn.     . simethicone (MYLICON) 80 MG chewable tablet Chew 160 mg by mouth every 6 (six) hours as needed for flatulence.    [name] Spacer/Aero-Holding Chambers (AEROCHAMBER MV) inhaler Use as instructed 1 each 0  . Tiotropium Bromide-Olodaterol (STIOLTO RESPIMAT) 2.5-2.5 MCG/ACT AERS Inhale 2 puffs into the lungs daily. 1 Inhaler 11   No current facility-administered medications for this visit.     Allergies:   Azithromycin; Ceclor [cefaclor]; Lipitor [atorvastatin]; Morphine and related; Penicillins; Septra [sulfamethoxazole-trimethoprim]; Simvastatin; Vicodin [hydrocodone-acetaminophen]; Zetia [ezetimibe]; Doxycycline; and Adhesive [tape]    Social History:  The patient  reports that she quit smoking about 5 years ago. Her smoking use included cigarettes. She has a 20.00 pack-year smoking history. She has never used smokeless tobacco. She reports that she does not drink alcohol or use drugs.   Family History:  The patient's family history includes Alcohol abuse in her father; Lupus in her mother; Migraines in her sister; Other in her sister; Pulmonary fibrosis in her brother.    ROS:  Please see the  history of present illness.   Otherwise, review of systems are positive for improved breathing, but still some fatigue- not back to baseline.   All other systems are reviewed and negative.    PHYSICAL EXAM: VS:  BP 120/86   Pulse 87   Ht 4' 11.5" (1.511 m)   Wt 117 lb 9.6 oz (53.3 kg)   LMP 03/25/2011   SpO2 95%   BMI 23.35 kg/m  , BMI Body mass index is 23.35 kg/m. GEN: Well nourished, well developed, in no acute distress  HEENT: normal  Neck: no JVD, carotid bruits, or masses Cardiac: RRR; 2/6 systolic murmurs, no rubs, or gallops,no edema  Respiratory:  clear to auscultation bilaterally, normal work of breathing GI: soft, nontender, nondistended, + BS MS: no deformity or atrophy  Skin: warm and dry, no rash Neuro:  Strength and sensation are intact Psych: euthymic mood, full affect    Recent Labs: 10/27/2017: B Natriuretic Peptide 503.8; TSH 1.576 01/02/2018: ALT 10 01/03/2018: BUN 15; Creatinine, Ser 0.79; Hemoglobin 12.6; Platelets 297; Potassium 3.9; Sodium 137 01/09/2018: Pro B Natriuretic peptide (BNP) 2,266.0   Lipid Panel    Component Value Date/Time   CHOL 152 08/18/2015 0840   TRIG 105 08/18/2015 0840   HDL 65 08/18/2015 0840   CHOLHDL 2.3 08/18/2015 0840   VLDL 21 08/18/2015 0840   LDLCALC 66 08/18/2015 0840     Other studies Reviewed: Additional studies/ records that were reviewed today with results demonstrating: labs reviewed from Nov 1.   ASSESSMENT AND PLAN:  1. CAD: Feeling much better after diuretics were increased.  Continue aggressive secondary prevention.  If she does develop any chest tightness or concern for angina, we will plan for cardiac cath.  She will let us know if symptoms change.  LDL 76 in 8/19. 2. Aortic stenosis: No symptoms of severe aortic stenosis.  Preserved S2 on exam. 3. Combined systolic and diastolic heart failure: Much improved with diuretics.  Will decrease Lasix back to 40 mg daily.  I have advised her to take a second  dose of Lasix 2 days a week.  She would also need an additional dose of Lasix if she gains more than 3 pounds or knows she is eating something excessively salty.  She has been very compliant with our instructions to reduce salt in her diet.  I suspect this is also help with her fluid retention.  Recheck BNP  and BMet today. 4. HTN: The current medical regimen is effective;  continue present plan and medications.   Current medicines are reviewed at length with the patient today.  The patient concerns regarding her medicines were addressed.  The following changes have been made:  No change  Labs/ tests ordered today include:  No orders of the defined types were placed in this encounter.   Recommend 150 minutes/week of aerobic exercise Low fat, low carb, high fiber diet recommended  Disposition:   FU in 6 months   Signed, Larae Grooms, MD  01/16/2018 1:55 PM    [facility] [address], [address], [place]  [address] Phone: [phone]; Fax: [phone]

## 2018-01-16 ENCOUNTER — Encounter: Payer: Self-pay | Admitting: Interventional Cardiology

## 2018-01-16 ENCOUNTER — Ambulatory Visit: Payer: Commercial Managed Care - PPO | Admitting: Interventional Cardiology

## 2018-01-16 VITALS — BP 120/86 | HR 87 | Ht 59.5 in | Wt 117.6 lb

## 2018-01-16 DIAGNOSIS — I5042 Chronic combined systolic (congestive) and diastolic (congestive) heart failure: Secondary | ICD-10-CM

## 2018-01-16 DIAGNOSIS — I10 Essential (primary) hypertension: Secondary | ICD-10-CM

## 2018-01-16 DIAGNOSIS — I350 Nonrheumatic aortic (valve) stenosis: Secondary | ICD-10-CM | POA: Diagnosis not present

## 2018-01-16 DIAGNOSIS — I25119 Atherosclerotic heart disease of native coronary artery with unspecified angina pectoris: Secondary | ICD-10-CM | POA: Diagnosis not present

## 2018-01-16 MED ORDER — FUROSEMIDE 40 MG PO TABS: 40.0000 mg | ORAL_TABLET | Freq: Every day | ORAL | 1 refills | Status: DC

## 2018-01-16 MED ORDER — TIOTROPIUM BROMIDE-OLODATEROL 2.5-2.5 MCG/ACT IN AERS: 2.0000 | INHALATION_SPRAY | Freq: Every day | RESPIRATORY_TRACT | 11 refills | Status: DC

## 2018-01-16 NOTE — Addendum Note (Signed)
Addended by: Amado Coe on: 01/16/2018 09:16 AM   Modules accepted: Orders

## 2018-01-16 NOTE — Progress Notes (Signed)
Chart and office note reviewed in detail  > agree with a/p as outlined    

## 2018-01-16 NOTE — Patient Instructions (Addendum)
Medication Instructions:  Your physician has recommended you make the following change in your medication:   DECREASE: furosemide (lasix) to 40 mg once a day  You may take an extra tablet for weight gain of greater than 3 lbs in 1 day or 5 lbs in 1 week or if you have increased swelling. Please let us know if you are having to do this more than twice a week.   If you need a refill on your cardiac medications before your next appointment, please call your pharmacy.   Lab work: TODAY: BMET, pro BNP  If you have labs (blood work) drawn today and your tests are completely normal, you will receive your results only by: [name] MyChart Message (if you have MyChart) OR . A paper copy in the mail If you have any lab test that is abnormal or we need to change your treatment, we will call you to review the results.  Testing/Procedures: None ordered  Follow-Up: At [facility], you and your health needs are our priority.  As part of our continuing mission to provide you with exceptional heart care, we have created designated Provider Care Teams.  These Care Teams include your primary Cardiologist (physician) and Advanced Practice Providers (APPs -  Physician Assistants and Nurse Practitioners) who all work together to provide you with the care you need, when you need it. . You will need a follow up appointment in 6 months.  Please call our office 2 months in advance to schedule this appointment.  You may see Casandra Doffing, MD or one of the following Advanced Practice Providers on your designated Care Team:   . Lyda Jester, PA-C . Dayna Dunn, PA-C . Ermalinda Barrios, PA-C  Any Other Special Instructions Will Be Listed Below (If Applicable).

## 2018-01-17 LAB — BASIC METABOLIC PANEL
BUN / CREAT RATIO: 22 (ref 12–28)
BUN: 16 mg/dL (ref 8–27)
CHLORIDE: 91 mmol/L — AB (ref 96–106)
CO2: 27 mmol/L (ref 20–29)
Calcium: 9.6 mg/dL (ref 8.7–10.3)
Creatinine, Ser: 0.73 mg/dL (ref 0.57–1.00)
GFR calc non Af Amer: 87 mL/min/{1.73_m2} (ref >59)
GFR, EST AFRICAN AMERICAN: 101 mL/min/{1.73_m2} (ref >59)
GLUCOSE: 91 mg/dL (ref 65–99)
Potassium: 3.9 mmol/L (ref 3.5–5.2)
Sodium: 137 mmol/L (ref 134–144)

## 2018-01-17 LAB — PRO B NATRIURETIC PEPTIDE: NT-Pro BNP: 8894 pg/mL — ABNORMAL HIGH (ref 0–287)

## 2018-01-21 ENCOUNTER — Telehealth: Payer: Self-pay | Admitting: *Deleted

## 2018-01-21 ENCOUNTER — Telehealth: Payer: Self-pay

## 2018-01-21 NOTE — Telephone Encounter (Signed)
-----   Message from Jettie Booze, MD sent at 01/20/2018 12:51 PM EST ----- Normal electrolytes.  Test still shows excess fluid in her system.  Let us know if her breathing gets worse.  COntinue with plans for twice a day Lasix several days a week.

## 2018-01-21 NOTE — Telephone Encounter (Signed)
Left message for patient to call back  

## 2018-01-21 NOTE — Telephone Encounter (Signed)
Called and spoke with patient. 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 Per Wyn Quaker FNP try the patient on Bevespi.2puffs in the am and 2 puffs in the pm.  A sample was placed at the front for patient to pick up. I instructed the patient to call after 4 days and let us know how it is working for her and we will call Rx in if working for patient.

## 2018-01-22 ENCOUNTER — Telehealth: Payer: Self-pay | Admitting: Interventional Cardiology

## 2018-01-22 NOTE — Telephone Encounter (Signed)
See telephone encounter from 11/19

## 2018-01-22 NOTE — Telephone Encounter (Signed)
Left message for patient to call back  

## 2018-01-22 NOTE — Telephone Encounter (Signed)
Called and made patient aware of lab results. Instructed the patient to let us know if she has increased SOB or swelling. Patient verbalized understanding and thanked me for the call.

## 2018-01-22 NOTE — Telephone Encounter (Signed)
New Message          Patient returned your call

## 2018-01-24 ENCOUNTER — Other Ambulatory Visit: Payer: Self-pay | Admitting: Pulmonary Disease

## 2018-01-24 MED ORDER — UMECLIDINIUM-VILANTEROL 62.5-25 MCG/INH IN AEPB: 1.0000 | INHALATION_SPRAY | Freq: Every day | RESPIRATORY_TRACT | 0 refills | Status: DC

## 2018-01-24 NOTE — Telephone Encounter (Signed)
Was able to get ahold of patient. She is going to try the Anoro since insurance doesn't cover the [place] did not help her at all.   2 samples were set at the front for pick up and patient is to call and let us know if working or not after 5 days.   Nothing further at this time.

## 2018-01-24 NOTE — Telephone Encounter (Signed)
Pt want's to know if we have samples of Stiolto she said that it is working very good. [phone]

## 2018-01-24 NOTE — Telephone Encounter (Signed)
atc pt, line rang several times with no answer, then was cut off before any vm was answered.  atc bac, line rang to fast busy signal.  Wcb.

## 2018-01-27 ENCOUNTER — Telehealth: Payer: Self-pay | Admitting: Pulmonary Disease

## 2018-01-27 ENCOUNTER — Other Ambulatory Visit: Payer: Self-pay | Admitting: Pulmonary Disease

## 2018-01-27 MED ORDER — TIOTROPIUM BROMIDE-OLODATEROL 2.5-2.5 MCG/ACT IN AERS: 2.0000 | INHALATION_SPRAY | Freq: Every day | RESPIRATORY_TRACT | 0 refills | Status: DC

## 2018-01-27 NOTE — Telephone Encounter (Signed)
lmtcb for pt.  

## 2018-01-27 NOTE — Telephone Encounter (Signed)
Patient returning call.  States she has about 1-2 days worth and wants to know if she can take Advair until Stiolto is approved or if we have samples for her.

## 2018-01-27 NOTE — Telephone Encounter (Signed)
Spoke with Ashley Akin. Lauren has put samples of Stiolto up front for pt.

## 2018-01-27 NOTE — Telephone Encounter (Signed)
Spoke with Ashley Akin,  RN in regards to pt. Per lauren, due to pt trying and failing Bevespi and now Anoro, we should be able to do PA for pt's Stiolto.  Called pt's pharmacy to have them fax the PA request to the office so I can initiate the PA.

## 2018-01-27 NOTE — Telephone Encounter (Signed)
Medication name and strength: Stiolto Repimat Provider: Wyn Quaker, NP Pharmacy: [facility] Patient insurance ID: [id number] Phone: [phone] Fax: [phone]  Was the PA started on CMM?  yes If yes, please enter the Key: [id number] Timeframe for approval/denial: expedited PA request; Approvedtoday CaseId:[id number];Status:Approved;Review Type:Prior Auth;Coverage Start Date:12/28/2017;Coverage End Date:01/27/2019  Called pt's pharmacy letting them know that the PA was done and med was approved and I stated to them we needed this Rx to be filled ASAP for pt due to her desperately needing this med. Pharmacy will get Rx taken care of for pt.  Called pt and spoke with her husband Iona Beard letting him know this had been done. I also stated to him that we have put some samples of the Stiolto up front for pt. Iona Beard will be coming by office to pick samples up for pt. Nothing further needed.

## 2018-01-31 ENCOUNTER — Encounter: Payer: Self-pay | Admitting: Primary Care

## 2018-01-31 ENCOUNTER — Ambulatory Visit: Payer: Commercial Managed Care - PPO | Admitting: Primary Care

## 2018-01-31 ENCOUNTER — Ambulatory Visit (INDEPENDENT_AMBULATORY_CARE_PROVIDER_SITE_OTHER)
Admission: RE | Admit: 2018-01-31 | Discharge: 2018-01-31 | Disposition: A | Discharge status: Home / Self Care | Payer: Commercial Managed Care - PPO | Source: Ambulatory Visit | Attending: Primary Care | Admitting: Primary Care

## 2018-01-31 VITALS — BP 126/72 | HR 76 | Ht 59.5 in | Wt 118.0 lb

## 2018-01-31 DIAGNOSIS — I5032 Chronic diastolic (congestive) heart failure: Secondary | ICD-10-CM

## 2018-01-31 DIAGNOSIS — J449 Chronic obstructive pulmonary disease, unspecified: Secondary | ICD-10-CM | POA: Diagnosis not present

## 2018-01-31 LAB — BASIC METABOLIC PANEL
BUN: 20 mg/dL (ref 6–23)
CO2: 33 meq/L — AB (ref 19–32)
CREATININE: 0.74 mg/dL (ref 0.40–1.20)
Calcium: 9.5 mg/dL (ref 8.4–10.5)
Chloride: 93 mEq/L — ABNORMAL LOW (ref 96–112)
GFR: 83.73 mL/min (ref >60.00)
GLUCOSE: 106 mg/dL — AB (ref 70–99)
Potassium: 4 mEq/L (ref 3.5–5.1)
Sodium: 137 mEq/L (ref 135–145)

## 2018-01-31 LAB — BRAIN NATRIURETIC PEPTIDE: Pro B Natriuretic peptide (BNP): 1573 pg/mL — ABNORMAL HIGH (ref 0.0–100.0)

## 2018-01-31 MED ORDER — TIOTROPIUM BROMIDE-OLODATEROL 2.5-2.5 MCG/ACT IN AERS: 2.0000 | INHALATION_SPRAY | Freq: Every day | RESPIRATORY_TRACT | 0 refills | Status: DC

## 2018-01-31 NOTE — Assessment & Plan Note (Addendum)
-   Shortness of breath does not appear to be related to COPD exacerbation - No overt wheezing on exam or mucopurulent cough  - Continue Stiolto 2 puffs daily - Schedule for 6MWT and office visit with Dr. Melvyn Novas in December

## 2018-01-31 NOTE — Progress Notes (Addendum)
@Patient  ID: Teresa [name], female    DOB: 1951-01-30, 67 y.o.   MRN: [id number]  Chief Complaint  Patient presents with  . Acute Visit    She has had more nonproductive  coughing. She is feeling more short of breath.    Referring provider: Leeroy Cha,*  HPI: 67 year old female, former smoker quit 2014 (20 pack year history). PMH COPD GOLD B, lung cancer s/p right upper lobectomy, CHF, CAD. Patient of Dr. Melvyn Novas, last seen by pulmonary NP on 01/09/18.   Follows with our office for COPD with worsening shortness of breath over the last 6 months. She has done best being managed on Stiolto in the past but can not afford prescription, she has been given samples. She tried Bevespi and Anoro with lack of improvement in her breathing. Follows with cardiology for new CHF with Dr. Conception Oms. last seen on 01/16/18.  BNP 8,894 2 weeks ago, prior to that BNP was 2,266 on 01/09/18. Kidney function has been normal. Hospitalized in August 2019 for CHF exac and treated with IV lasix.   02/03/2018 Patient presents today with complaints of SOB and dry cough x 2 weeks. Felt better when taking 40mg  lasix twice a day. Instructed to take additional tab in the afternoon if she consumes additional salt that day. She took two doses of lasix yesterday. She gets SOB when lying flat. Cough is non-productive. Denies wheezing.    Tests:  01/08/2018-pulmonary function test-FVC 1.27 (47% predicted), postbronchodilator ratio 66, postbronchodilator FEV1 38, no significant bronchodilator response, patient was unable to complete DLCO >>>Patient did take 1 puff of Stiolto prior to this test  01/02/18-CT chest with contrast- status post right upper lobectomy, no findings of recurrent or metastatic disease, suspicion for interstitial lung disease similar to prior exam, nonspecific interstitial pneumonitis  11/05/2017-Echocardiogram-LV ejection fraction 40 to 57%, grade 2 diastolic dysfunction   Allergies  Allergen  Reactions  . Azithromycin Shortness Of Breath  . Ceclor [Cefaclor] Shortness Of Breath  . Lipitor [Atorvastatin] Other (See Comments)    Severe muscle aches  . Morphine And Related Other (See Comments)    Terrible headache  . Penicillins Other (See Comments)    Unknown from childhood  . Septra [Sulfamethoxazole-Trimethoprim] Shortness Of Breath and Rash  . Simvastatin Other (See Comments)    Severe muscle aches  . Vicodin [Hydrocodone-Acetaminophen] Nausea And Vomiting  . Zetia [Ezetimibe] Other (See Comments)    Severe stomach pain  . Doxycycline Swelling and Other (See Comments)    Redness on the face  . Adhesive [Tape] Other (See Comments)    Redness and swelling - use paper tape    Immunization History  Administered Date(s) Administered  . Influenza Split 12/04/2010  . Influenza Whole 11/18/2017  . Influenza-Unspecified 01/04/2012, 12/05/2012, 12/17/2013  . Pneumococcal-Unspecified 11/03/2008    Past Medical History:  Diagnosis Date  . AAA (abdominal aortic aneurysm) ([place])    a. small by cath 2014.  . Arthritis    "across my hips; buttocks; comes w/the weather" (02/27/2013)  . CAD (coronary artery disease), native coronary artery    a. 10/2012 - rotational atherectomy of LAD c/b dissection s/p PCI to LAD, LCx, LM. b. 12/2012 - s/p overlapping DES to RCA.  . Cataract    "just the beginnings on the right" (02/27/2013)  . Cellulitis 06/20/2012   "dog bite; right forearm" (06/24/2012)  . Chronic bronchitis ([place])    "used to have it alot; haven't had it in a long time" (02/27/2013)  .  Chronic combined systolic and diastolic CHF (congestive heart failure) ([place])   . COPD (chronic obstructive pulmonary disease) ([place])    "little bit" (02/27/2013)  . GERD (gastroesophageal reflux disease)   . Hypercholesterolemia   . Ischemic cardiomyopathy    a. EF 45-50%.  . Mass of lung    "small tumor RUL; they are watching it" (02/27/2013)  . Myocardial infarction [facility])    "think dr  said I've had 2 silent one" (02/27/2013)  . Peripheral vascular disease, unspecified ([place])   . Pneumonia 1990's   "once" (02/27/2013)  . Seasonal allergies   . Severe aortic stenosis   . Tachycardia, unspecified   . Tobacco abuse     Tobacco History: Social History   Tobacco Use  Smoking Status Former Smoker  . Packs/day: 0.50  . Years: 40.00  . Pack years: 20.00  . Types: Cigarettes  . Last attempt to quit: 10/08/2012  . Years since quitting: 5.3  Smokeless Tobacco Never Used   Counseling given: Not Answered   Outpatient Medications Prior to Visit  Medication Sig Dispense Refill  . albuterol (PROAIR HFA) 108 (90 Base) MCG/ACT inhaler Inhale 2 puffs into the lungs every 6 (six) hours as needed for wheezing or shortness of breath.    . Alirocumab (PRALUENT) 150 MG/ML SOPN Inject 150 mg into the skin every 14 (fourteen) days. 2 pen 11  . cholecalciferol (VITAMIN D) 1000 UNITS tablet Take 1 tablet (1,000 Units total) by mouth at bedtime. 90 tablet 0  . clopidogrel (PLAVIX) 75 MG tablet Take 1 tablet (75 mg total) by mouth daily. 90 tablet 2  . diltiazem (CARDIZEM CD) 240 MG 24 hr capsule Take 1 capsule (240 mg total) by mouth daily. 90 capsule 3  . fluticasone (FLONASE) 50 MCG/ACT nasal spray Place into both nostrils at bedtime.   4  . furosemide (LASIX) 40 MG tablet Take 1 tablet (40 mg total) by mouth daily. 90 tablet 1  . Hypromellose (GENTEAL MILD) 0.2 % SOLN Place 2 drops into both eyes as needed.    . lansoprazole (PREVACID) 30 MG capsule Take 1 capsule (30 mg total) by mouth daily at 12 noon. 90 capsule 3  . NASAL SALINE NA Place 1 spray into the nose daily as needed (congestion).     . nitroGLYCERIN (NITROSTAT) 0.4 MG SL tablet Place 1 tablet (0.4 mg total) under the tongue every 5 (five) minutes as needed for chest pain. 25 tablet 4  . OVER THE COUNTER MEDICATION Take 1 tablet by mouth daily with lunch. [facility] Menopause support    . potassium chloride SA (K-DUR,KLOR-CON) 20  MEQ tablet Take 0.5 tablets (10 mEq total) by mouth daily. 90 tablet 1  . ranitidine (ZANTAC) 150 MG tablet Take 150 mg by mouth daily as needed for heartburn.     . simethicone (MYLICON) 80 MG chewable tablet Chew 160 mg by mouth every 6 (six) hours as needed for flatulence.    [name] Spacer/Aero-Holding Chambers (AEROCHAMBER MV) inhaler Use as instructed 1 each 0  . Tiotropium Bromide-Olodaterol (STIOLTO RESPIMAT) 2.5-2.5 MCG/ACT AERS Inhale 2 puffs into the lungs daily. 1 Inhaler 11  . Tiotropium Bromide-Olodaterol (STIOLTO RESPIMAT) 2.5-2.5 MCG/ACT AERS Inhale 2 puffs into the lungs daily. 2 Inhaler 0  . umeclidinium-vilanterol (ANORO ELLIPTA) 62.5-25 MCG/INH AEPB Inhale 1 puff into the lungs daily. 2 each 0   No facility-administered medications prior to visit.     Review of Systems  Review of Systems  Constitutional: Negative.  Negative for unexpected  weight change.  HENT: Negative.   Respiratory: Positive for cough and shortness of breath. Negative for apnea, choking, chest tightness, wheezing and stridor.   Cardiovascular: Negative for chest pain, palpitations and leg swelling.       Orthopnea    Physical Exam  BP 126/72 (BP Location: Left Arm, Patient Position: Sitting, Cuff Size: Normal)   Pulse 76   Ht 4' 11.5" (1.511 m)   Wt 118 lb (53.5 kg)   LMP 03/25/2011   SpO2 96%   BMI 23.43 kg/m  Physical Exam  Constitutional: She is oriented to person, place, and time. She appears well-developed and well-nourished. No distress.  HENT:  Head: Normocephalic and atraumatic.  Eyes: Pupils are equal, round, and reactive to light. EOM are normal.  Neck: Normal range of motion. Neck supple.  Cardiovascular: Normal rate and regular rhythm.  Pulmonary/Chest: Effort normal. No respiratory distress. She has no wheezes.  Fine crackles left mid-lobe. No wheeze or resp distress.   Musculoskeletal: Normal range of motion.  Neurological: She is alert and oriented to person, place, and time.    Skin: Skin is warm and dry.  Psychiatric: She has a normal mood and affect. Her behavior is normal. Judgment and thought content normal.     Lab Results:  CBC    Component Value Date/Time   WBC 7.8 01/03/2018 0031   RBC 4.53 01/03/2018 0031   HGB 12.6 01/03/2018 0031   HGB 13.9 01/02/2017 0737   HCT 41.1 01/03/2018 0031   HCT 42.0 01/02/2017 0737   PLT 297 01/03/2018 0031   PLT 264 01/02/2017 0737   MCV 90.7 01/03/2018 0031   MCV 89.2 01/02/2017 0737   MCH 27.8 01/03/2018 0031   MCHC 30.7 01/03/2018 0031   RDW 13.0 01/03/2018 0031   RDW 13.8 01/02/2017 0737   LYMPHSABS 1.7 01/02/2018 1046   LYMPHSABS 1.9 01/02/2017 0737   MONOABS 0.6 01/02/2018 1046   MONOABS 0.5 01/02/2017 0737   EOSABS 0.1 01/02/2018 1046   EOSABS 0.2 01/02/2017 0737   BASOSABS 0.0 01/02/2018 1046   BASOSABS 0.1 01/02/2017 0737    BMET    Component Value Date/Time   NA 137 01/31/2018 0932   NA 137 01/16/2018 1417   NA 140 01/02/2017 0737   K 4.0 01/31/2018 0932   K 4.4 01/02/2017 0737   CL 93 (L) 01/31/2018 0932   CO2 33 (H) 01/31/2018 0932   CO2 29 01/02/2017 0737   GLUCOSE 106 (H) 01/31/2018 0932   GLUCOSE 81 01/02/2017 0737   BUN 20 01/31/2018 0932   BUN 16 01/16/2018 1417   BUN 17.2 01/02/2017 0737   CREATININE 0.74 01/31/2018 0932   CREATININE 0.7 01/02/2017 0737   CALCIUM 9.5 01/31/2018 0932   CALCIUM 9.7 01/02/2017 0737   GFRNONAA 87 01/16/2018 1417   GFRAA 101 01/16/2018 1417    BNP    Component Value Date/Time   BNP 503.8 (H) 10/27/2017 1218    ProBNP    Component Value Date/Time   PROBNP 1,573.0 (H) 01/31/2018 0932    Imaging: Dg Chest 2 View  Result Date: 01/31/2018 CLINICAL DATA:  Shortness of breath. EXAM: CHEST - 2 VIEW COMPARISON:  Chest x-ray dated January 03, 2018. FINDINGS: Stable cardiomediastinal silhouette. Interstitial markings appear mildly increased, most prominent in the left lung. New small left pleural effusion. Unchanged postsurgical changes and  volume loss in the right lung. No pneumothorax. No acute osseous abnormality. IMPRESSION: 1. Increased interstitial pulmonary edema with new small left pleural effusion.  Electronically Signed   By: Titus Dubin M.D.   On: 01/31/2018 10:03     Assessment & Plan:   Chronic diastolic CHF (congestive heart failure) (H[facility]) - Symptoms most consistent with CHF exacerbation - BNP 1,573 (2,266) - CXR today showed increased interstitial pulmonary edema with new small left pleural effusion - Instructed to take 40mg  lasix twice daily until told otherwise  - Needs to call cardiology on monday, I will let them know as well when labs results  - Advised ED if sob worsens   COPD GOLD III B - Shortness of breath does not appear to be related to COPD exacerbation - No overt wheezing on exam or mucopurulent cough  - Continue Stiolto 2 puffs daily - Schedule for 6MWT and office visit with Dr. Melvyn Novas in December    Martyn Ehrich, NP 02/03/2018

## 2018-01-31 NOTE — Patient Instructions (Addendum)
Shortness of breath most consistent with CHF exacerbation   Vital sings were normal, oxygen 96% room air   We will check labs here today and CXR at [facility] office   Increase Lasix to 40 mg twice a day until otherwise told  Monitor daily weight and practice low sodium diet   Follow up with cardiology   ED if symptoms worsen in any way    Heart Failure Heart failure is a condition in which the heart has trouble pumping blood because it has become weak or stiff. This means that the heart does not pump blood efficiently for the body to work well. For some people with heart failure, fluid may back up into the lungs and there may be swelling (edema) in the lower legs. Heart failure is usually a long-term (chronic) condition. It is important for you to take good care of yourself and follow the treatment plan from your health care provider. What are the causes? This condition is caused by some health problems, including:  High blood pressure (hypertension). Hypertension causes the heart muscle to work harder than normal. High blood pressure eventually causes the heart to become stiff and weak.  Coronary artery disease (CAD). CAD is the buildup of cholesterol and fat (plaques) in the arteries of the heart.  Heart attack (myocardial infarction). Injured tissue, which is caused by the heart attack, does not contract as well and the heart's ability to pump blood is weakened.  Abnormal heart valves. When the heart valves do not open and close properly, the heart muscle must pump harder to keep the blood flowing.  Heart muscle disease (cardiomyopathy or myocarditis). Heart muscle disease is damage to the heart muscle from a variety of causes, such as drug or alcohol abuse, infections, or unknown causes. These can increase the risk of heart failure.  Lung disease. When the lungs do not work properly, the heart must work harder.  What increases the risk? Risk of heart failure increases as a person  ages. This condition is also more likely to develop in people who:  Are overweight.  Are female.  Smoke or chew tobacco.  Abuse alcohol or illegal drugs.  Have taken medicines that can damage the heart, such as chemotherapy drugs.  Have diabetes. ? High blood sugar (glucose) is associated with high fat (lipid) levels in the blood. ? Diabetes can also damage tiny blood vessels that carry nutrients to the heart muscle.  Have abnormal heart rhythms.  Have thyroid problems.  Have low blood counts (anemia).  What are the signs or symptoms? Symptoms of this condition include:  Shortness of breath with activity, such as when climbing stairs.  Persistent cough.  Swelling of the feet, ankles, legs, or abdomen.  Unexplained weight gain.  Difficulty breathing when lying flat (orthopnea).  Waking from sleep because of the need to sit up and get more air.  Rapid heartbeat.  Fatigue and loss of energy.  Feeling light-headed, dizzy, or close to fainting.  Loss of appetite.  Nausea.  Increased urination during the night (nocturia).  Confusion.  How is this diagnosed? This condition is diagnosed based on:  Medical history, symptoms, and a physical exam.  Diagnostic tests, which may include: ? Echocardiogram. ? Electrocardiogram (ECG). ? Chest X-ray. ? Blood tests. ? Exercise stress test. ? Radionuclide scans. ? Cardiac catheterization and angiogram.  How is this treated? Treatment for this condition is aimed at managing the symptoms of heart failure. Medicines, behavioral changes, or other treatments may be necessary  to treat heart failure. Medicines These may include:  Angiotensin-converting enzyme (ACE) inhibitors. This type of medicine blocks the effects of a blood protein called angiotensin-converting enzyme. ACE inhibitors relax (dilate) the blood vessels and help to lower blood pressure.  Angiotensin receptor blockers (ARBs). This type of medicine blocks the  actions of a blood protein called angiotensin. ARBs dilate the blood vessels and help to lower blood pressure.  Water pills (diuretics). Diuretics cause the kidneys to remove salt and water from the blood. The extra fluid is removed through urination, leaving a lower volume of blood that the heart has to pump.  Beta blockers. These improve heart muscle strength and they prevent the heart from beating too quickly.  Digoxin. This increases the force of the heartbeat.  Healthy behavior changes These may include:  Reaching and maintaining a healthy weight.  Stopping smoking or chewing tobacco.  Eating heart-healthy foods.  Limiting or avoiding alcohol.  Stopping use of street drugs (illegal drugs).  Physical activity.  Other treatments These may include:  Surgery to open blocked coronary arteries or repair damaged heart valves.  Placement of a biventricular pacemaker to improve heart muscle function (cardiac resynchronization therapy). This device paces both the right ventricle and left ventricle.  Placement of a device to treat serious abnormal heart rhythms (implantable cardioverter defibrillator, or ICD).  Placement of a device to improve the pumping ability of the heart (left ventricular assist device, or LVAD).  Heart transplant. This can cure heart failure, and it is considered for certain patients who do not improve with other therapies.  Follow these instructions at home: Medicines  Take over-the-counter and prescription medicines only as told by your health care provider. Medicines are important in reducing the workload of your heart, slowing the progression of heart failure, and improving your symptoms. ? Do not stop taking your medicine unless your health care provider told you to do that. ? Do not skip any dose of medicine. ? Refill your prescriptions before you run out of medicine. You need your medicines every day. Eating and drinking   Eat heart-healthy foods.  Talk with a dietitian to make an eating plan that is right for you. ? Choose foods that contain no trans fat and are low in saturated fat and cholesterol. Healthy choices include fresh or frozen fruits and vegetables, fish, lean meats, legumes, fat-free or low-fat dairy products, and whole-grain or high-fiber foods. ? Limit salt (sodium) if directed by your health care provider. Sodium restriction may reduce symptoms of heart failure. Ask a dietitian to recommend heart-healthy seasonings. ? Use healthy cooking methods instead of frying. Healthy methods include roasting, grilling, broiling, baking, poaching, steaming, and stir-frying.  Limit your fluid intake if directed by your health care provider. Fluid restriction may reduce symptoms of heart failure. Lifestyle  Stop smoking or using chewing tobacco. Nicotine and tobacco can damage your heart and your blood vessels. Do not use nicotine gum or patches before talking to your health care provider.  Limit alcohol intake to no more than 1 drink per day for non-pregnant women and 2 drinks per day for men. One drink equals 12 oz of beer, 5 oz of wine, or 1 oz of hard liquor. ? Drinking more than that is harmful to your heart. Tell your health care provider if you drink alcohol several times a week. ? Talk with your health care provider about whether any level of alcohol use is safe for you. ? If your heart has already been damaged  by alcohol or you have severe heart failure, drinking alcohol should be stopped completely.  Stop use of illegal drugs.  Lose weight if directed by your health care provider. Weight loss may reduce symptoms of heart failure.  Do moderate physical activity if directed by your health care provider. People who are elderly and people with severe heart failure should consult with a health care provider for physical activity recommendations. Monitor important information  Weigh yourself every day. Keeping track of your weight  daily helps you to notice excess fluid sooner. ? Weigh yourself every morning after you urinate and before you eat breakfast. ? Wear the same amount of clothing each time you weigh yourself. ? Record your daily weight. Provide your health care provider with your weight record.  Monitor and record your blood pressure as told by your health care provider.  Check your pulse as told by your health care provider. Dealing with extreme temperatures  If the weather is extremely hot: ? Avoid vigorous physical activity. ? Use air conditioning or fans or seek a cooler location. ? Avoid caffeine and alcohol. ? Wear loose-fitting, lightweight, and light-colored clothing.  If the weather is extremely cold: ? Avoid vigorous physical activity. ? Layer your clothes. ? Wear mittens or gloves, a hat, and a scarf when you go outside. ? Avoid alcohol. General instructions  Manage other health conditions such as hypertension, diabetes, thyroid disease, or abnormal heart rhythms as told by your health care provider.  Learn to manage stress. If you need help to do this, ask your health care provider.  Plan rest periods when fatigued.  Get ongoing education and support as needed.  Participate in or seek rehabilitation as needed to maintain or improve independence and quality of life.  Stay up to date with immunizations. Keeping current on pneumococcal and influenza immunizations is especially important to prevent respiratory infections.  Keep all follow-up visits as told by your health care provider. This is important. Contact a health care provider if:  You have a rapid weight gain.  You have increasing shortness of breath that is unusual for you.  You are unable to participate in your usual physical activities.  You tire easily.  You cough more than normal, especially with physical activity.  You have any swelling or more swelling in areas such as your hands, feet, ankles, or abdomen.  You  are unable to sleep because it is hard to breathe.  You feel like your heart is beating quickly (palpitations).  You become dizzy or light-headed when you stand up. Get help right away if:  You have difficulty breathing.  You notice or your family notices a change in your awareness, such as having trouble staying awake or having difficulty with concentration.  You have pain or discomfort in your chest.  You have an episode of fainting (syncope). This information is not intended to replace advice given to you by your health care provider. Make sure you discuss any questions you have with your health care provider. Document Released: 02/19/2005 Document Revised: 10/25/2015 Document Reviewed: 09/14/2015 Elsevier Interactive Patient Education  [employer].

## 2018-01-31 NOTE — Assessment & Plan Note (Addendum)
-   Symptoms most consistent with CHF exacerbation - BNP 1,573 (2,266) - CXR today showed increased interstitial pulmonary edema with new small left pleural effusion - Instructed to take 40mg  lasix twice daily until told otherwise  - Needs to call cardiology on 02/03/18, I will let them know as well when labs results  - Advised ED if sob worsens

## 2018-02-03 ENCOUNTER — Telehealth: Payer: Self-pay | Admitting: Interventional Cardiology

## 2018-02-03 NOTE — Progress Notes (Signed)
Chart and office note reviewed in detail  > agree with a/p as outlined    

## 2018-02-03 NOTE — Telephone Encounter (Signed)
New message   Patient states that she went to [facility] on 01/31/2018 and patient states that she had blood work done and the information will be forwarded to the office.

## 2018-02-04 DIAGNOSIS — B9620 Unspecified Escherichia coli [E. coli] as the cause of diseases classified elsewhere: Secondary | ICD-10-CM | POA: Diagnosis not present

## 2018-02-04 DIAGNOSIS — R350 Frequency of micturition: Secondary | ICD-10-CM | POA: Diagnosis not present

## 2018-02-04 DIAGNOSIS — N390 Urinary tract infection, site not specified: Secondary | ICD-10-CM | POA: Diagnosis not present

## 2018-02-10 NOTE — Telephone Encounter (Signed)
Noted  

## 2018-02-11 ENCOUNTER — Telehealth: Payer: Self-pay | Admitting: Interventional Cardiology

## 2018-02-11 NOTE — Telephone Encounter (Signed)
Called and scheduled patient to see Dr. Irish Lack on 1/6 at 1:20 PM

## 2018-02-11 NOTE — Telephone Encounter (Signed)
  Patient is returning call and would like to know if she needs to do blood work

## 2018-02-14 ENCOUNTER — Encounter: Payer: Self-pay | Admitting: Internal Medicine

## 2018-02-14 ENCOUNTER — Ambulatory Visit: Payer: Commercial Managed Care - PPO | Admitting: Internal Medicine

## 2018-02-14 ENCOUNTER — Ambulatory Visit (INDEPENDENT_AMBULATORY_CARE_PROVIDER_SITE_OTHER): Payer: PPO | Admitting: *Deleted

## 2018-02-14 VITALS — BP 124/80 | HR 110 | Ht 59.5 in | Wt 114.0 lb

## 2018-02-14 DIAGNOSIS — J449 Chronic obstructive pulmonary disease, unspecified: Secondary | ICD-10-CM

## 2018-02-14 DIAGNOSIS — J9611 Chronic respiratory failure with hypoxia: Secondary | ICD-10-CM

## 2018-02-14 MED ORDER — PREDNISONE 10 MG PO TABS: ORAL_TABLET | ORAL | 0 refills | Status: DC

## 2018-02-14 MED ORDER — LEVOFLOXACIN 500 MG PO TABS: 500.0000 mg | ORAL_TABLET | Freq: Every day | ORAL | 0 refills | Status: DC

## 2018-02-14 MED ORDER — METHYLPREDNISOLONE ACETATE 80 MG/ML IJ SUSP
120.0000 mg | Freq: Once | INTRAMUSCULAR | Status: AC
  Administered 2018-02-14: 120 mg via INTRAMUSCULAR

## 2018-02-14 NOTE — Progress Notes (Signed)
SIX MIN WALK 02/14/2018  Medications Vitamin D 1000units, Plavis 75mg , Cardizem 240mg , Lasix 40mg , Prevacid 30mg , Potassium Chloride 3meq, Stiolto at 5:30 and Albuterol inhaler at 10:15  Supplimental Oxygen during Test? (L/min) No  Laps 10  Partial Lap (in Meters) 0  Baseline BP (sitting) 118/76  Baseline Heartrate 105  Baseline Dyspnea (Borg Scale) 4  Baseline Fatigue (Borg Scale) 3  Baseline SPO2 92  BP (sitting) 130/80  Heartrate 118  Dyspnea (Borg Scale) 3  Fatigue (Borg Scale) 2  SPO2 84  BP (sitting) 124/80  Heartrate 110  SPO2 97  Stopped or Paused before Six Minutes No  Distance Completed 340  Tech Comments: patient walked at a good pace coughing throughout the walk and was "huffing and puffing" during the walk

## 2018-02-14 NOTE — Progress Notes (Signed)
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  In meantime repeat CT Chest c/w evolving increase in density RUL lesion so referred to pulmonary. Has dx of mod to severe AS with most recent echo done 2 year prior to Teresa [name]  But yearly eval due w/in a week.  No ex cp or presyncope, denies any limiting sob with desired activities. rec You have a small nodule in right upper lobe that could be a atypical infection or an early tumor     DIAGNOSIS: Stage IB (T2a., N0, M0) poorly differentiated squamous cell carcinoma, diagnosed in December of 2014.  PRIOR THERAPY: Flexible video bronchoscopy, right upper lobectomy with mediastinal lymph node dissection under the care of Dr. Cyndia [name].   CURRENT THERAPY: Observation as of ov 01/10/16 s any adjuvant rx    01/13/2016   Consultation Teresa [name] re:  Abn ct/ cough x 3 days on gerd rx tiw and cough drops  Chief Complaint  Patient presents with  . Pulmonary Consult    Referred by Dr. Earlie [name] for eval of recent chest ct. Teresa [name] c/o cough for the past few days- prod with clear sputum.    Not limited by breathing from desired activities  / chasing toddlers around all  Day s need for albuterol except in the spring and the fall   Imp gold II copd/ uacs  rec When coughing > take prevacid 30 mg Take 30-60 min before first meal of the day and pepcid ac 20 mg at bedtime until you stop coughing GERD rx     12/31/2017 acute extended ov/Teresa [name] re:  Cough / sp ER eval 12/16/17 for  bronchospasm rx prednisone and improved some Chief Complaint  Patient presents with  . Acute Visit    Pt states having "bronchitis and bronchial spasms" since September 2019.  Teresa [name] is "hacking"- non prod cough.   was using proair in aug 2019 3-4 x per week for cough/ wheeze stopped working as well  Sept 2019  and rx pred/advair 12/26/17 helped breathing  a lot but throat started feeling "raw" and dry day > noct cough/ urge to clear throat which is new since started advair 250 dpi Sleep flat bed/ large pillow on side [place] mall since starting advair nl pace  Not needing saba now  rec Plan A = Automatic = change advair to stiolto 2 pffs each am  Work on inhaler technique:   Plan B = Backup Only use your albuterol as a rescue medication    01/09/18 NP eval rec >>>BNP  =  2,266  Could consider high-res CT in the future Stiolto Respimat inhaler >>>2 puffs daily Only use your albuterol as a rescue medication    01/16/18  Cards eval/ [place] 1. CAD: Feeling much better after diuretics were increased.  Continue aggressive secondary prevention.  If Teresa [name] does develop any chest tightness or concern for angina, we will plan for  cardiac cath.  Teresa [name] will let us know if symptoms change.  LDL 76 in 8/19. 2. Aortic stenosis: No symptoms of severe aortic stenosis.  Preserved S2 on exam. 3. Combined systolic and diastolic heart failure: Much improved with diuretics.  Will decrease Lasix back to 40 mg daily.  I have advised her to take a second dose of Lasix 2 days a week.  Teresa [name] would also need an additional dose of Lasix if Teresa [name] gains more than 3 pounds or knows Teresa [name] is eating something excessively salty.  Teresa [name] has been very compliant with our instructions to reduce salt in her diet.  I suspect this is also help with her fluid retention.  Recheck BNP and BMet today. 4. HTN: The current medical regimen is effective;  continue present plan and medications.   01/31/18 NP eval/rec We will check labs here  today and CXR at [facility] office +  bnp [id number]  Increase Lasix to 40 mg twice a day until otherwise told Follow up with cardiology     02/14/2018  f/u ov/Teresa [name] re: GOLD III/aecopd on stiolto 2 each am  Chief Complaint  Patient presents with  . Follow-up    6MW done today. Teresa [name] c/o increased cough 4 days- prod with white sputum. Teresa [name] is using her proair inhaler about 4 x per day.   Dyspnea:  Gradually Worse doe  x 4 days assoc with cough, improved in saba and comfortable at rest sitting Cough: more cough/congestion  Sleeping: in recliner most  Nights esp since 4 day SABA use: none until acute onset  02: none     No obvious day to day or daytime variability or assoc  purulent sputum or mucus plugs or hemoptysis or cp or chest tightness, subjective wheeze or overt sinus or hb symptoms.     Also denies any obvious fluctuation of symptoms with weather or environmental changes or other aggravating or alleviating factors except as outlined above   No unusual exposure hx or h/o childhood pna/ asthma or knowledge of premature birth.  Current Allergies, Complete Past Medical History, Past Surgical History, Family History, and Social History were reviewed in [employer] record.  ROS  The following are not active complaints unless bolded Hoarseness, sore throat, dysphagia, dental problems, itching, sneezing,  nasal congestion or discharge of excess mucus or purulent secretions, ear ache,   fever, chills, sweats, unintended wt loss or wt gain, classically pleuritic or exertional cp,  orthopnea pnd or arm/hand swelling  or leg swelling, presyncope, palpitations, abdominal pain, anorexia, nausea, vomiting, diarrhea  or change in bowel habits or change in bladder habits, change in stools or change in urine, dysuria, hematuria,  rash, arthralgias, visual complaints, headache, numbness, weakness or ataxia or problems with walking or coordination,  change in mood or  memory.        Current  Meds  Medication Sig  . albuterol (PROAIR HFA) 108 (90 Base) MCG/ACT inhaler Inhale 2 puffs into the lungs every 6 (six) hours as needed for wheezing or shortness of breath.  . Alirocumab (PRALUENT) 150 MG/ML SOPN Inject 150 mg into the skin every 14 (fourteen) days.  . cholecalciferol (VITAMIN D) 1000 UNITS tablet Take 1 tablet (1,000 Units total) by mouth at bedtime.  . clopidogrel (PLAVIX) 75 MG tablet Take 1 tablet (75 mg total) by mouth daily.  [name] diltiazem (CARDIZEM CD) 240 MG 24 hr capsule Take 1 capsule (240 mg total) by mouth daily.  . fluticasone (FLONASE) 50 MCG/ACT nasal spray  Place into both nostrils at bedtime.   . furosemide (LASIX) 40 MG tablet Take 1 tablet (40 mg total) by mouth daily.  . Hypromellose (GENTEAL MILD) 0.2 % SOLN Place 2 drops into both eyes as needed.  . lansoprazole (PREVACID) 30 MG capsule Take 1 capsule (30 mg total) by mouth daily at 12 noon.  [name] NASAL SALINE NA Place 1 spray into the nose daily as needed (congestion).   . nitroGLYCERIN (NITROSTAT) 0.4 MG SL tablet Place 1 tablet (0.4 mg total) under the tongue every 5 (five) minutes as needed for chest pain.  [name] OVER THE COUNTER MEDICATION Take 1 tablet by mouth daily with lunch. [facility] Menopause support  . potassium chloride SA (K-DUR,KLOR-CON) 20 MEQ tablet Take 0.5 tablets (10 mEq total) by mouth daily.  . ranitidine (ZANTAC) 150 MG tablet Take 150 mg by mouth daily as needed for heartburn.   . simethicone (MYLICON) 80 MG chewable tablet Chew 160 mg by mouth every 6 (six) hours as needed for flatulence.  [name] Spacer/Aero-Holding Chambers (AEROCHAMBER MV) inhaler Use as instructed  . Tiotropium Bromide-Olodaterol (STIOLTO RESPIMAT) 2.5-2.5 MCG/ACT AERS Inhale 2 puffs into the lungs daily.           Objective:   Physical Exam   pleasant wf nad   Wt 125  08/24/11 >  11/07/2011  122> 07/14/2012 123 >   01/13/2016   133 > 12/31/2017  120 >  02/14/2018  114  Vital signs reviewed - Note on arrival 02 sats  94% on  RA        3/6 SEM       HEENT: nl dentition / oropharynx. Nl external ear canals without cough reflex -  Mild bilateral non-specific turbinate edema     NECK :  without JVD/Nodes/TM/ nl carotid upstrokes bilaterally   LUNGS: no acc muscle use,  Mild barrel  contour chest wall with bilateral  Insp/exp rhonchi  and  without cough on insp or exp maneuver and mild  Hyperresonant  to  percussion bilaterally     CV:  RRR  no s3  But III/ VI SEM, slt reduced s2  No  increase in P2, and no trace edema both ankles   ABD:  soft and nontender with pos late  insp Hoover's  in the supine position. No bruits or organomegaly appreciated, bowel sounds nl  MS:   Nl gait/  ext warm without deformities, calf tenderness, cyanosis or clubbing No obvious joint restrictions   SKIN: warm and dry without lesions    NEURO:  alert, approp, nl sensorium with  no motor or cerebellar deficits apparent.            I personally reviewed images and agree with radiology impression as follows:  CXR:   01/31/18 1. Increased interstitial pulmonary edema with new small left pleural effusion.          Assessment & Plan:

## 2018-02-14 NOTE — Patient Instructions (Addendum)
Goal is to keep the 02 sats above 90% at all times   2lpm 24/7 starting today   depomedrol 120 mg IM today   Please schedule a follow up office visit in 6 weeks, call sooner if needed

## 2018-02-16 ENCOUNTER — Encounter: Payer: Self-pay | Admitting: Internal Medicine

## 2018-02-16 NOTE — Assessment & Plan Note (Addendum)
02/14/2018 Patient Saturations on Room Air at Rest = 94% then  while Ambulating = 84% - On  2 Liters of oxygen while Ambulating = 94%  rec 2lpm 24/7 as of 02/14/2018 due to co-existing chf   Goals of care reviewed = keep sats > 90% at all times

## 2018-02-16 NOTE — Assessment & Plan Note (Addendum)
-   PFT's 11/07/2011 FEV1  0.96 (51%) ratio 50 and no better p B2, DLCO 78% - PFT's  09/02/2012  FEV1 1.38 (64 % ) ratio 66    with DLCO  73 % corrects to 93 % for alv volume   - quit smoking 09/2012 - Spirometry 12/31/2017  FEV1 0.8 (43%)  Ratio 61 p advair 250 try stiolto 2 pffs each am and return for pfts 6 weeks   - PFT's  01/08/18   FEV1 0.82 (40 % ) ratio 64  p no % improvement from saba p nothing prior to study - unable to perform  DLCO    02/14/2018  8mw 340 meters with desats > see chronic resp failure a/p    02/14/2018  After extensive coaching inhaler device,  effectiveness =    90% with smi    She may also have a component of cardiac asthma but lama is good choice for this so no change rx stiolto (lama/laba)  But she is having a mild flare with non-specific features so rec  1) start 02 in view of cardiac dx 2) Depomedrol 120 mg IM  3) consider changing to lama/laba/ ICS if continues to flare    I had an extended discussion with the patient reviewing all relevant studies completed to date and  lasting 15 to 20 minutes of a 25 minute visit    See device teaching which extended face to face time for this visit.  Each maintenance medication was reviewed in detail including emphasizing most importantly the difference between maintenance and prns and under what circumstances the prns are to be triggered using an action plan format that is not reflected in the computer generated alphabetically organized AVS which I have not found useful in most complex patients, especially with respiratory illnesses  Please see AVS for specific instructions unique to this visit that I personally wrote and verbalized to the the pt in detail and then reviewed with pt  by my nurse highlighting any  changes in therapy recommended at today's visit to their plan of care.

## 2018-02-19 ENCOUNTER — Encounter: Payer: Self-pay | Admitting: Interventional Cardiology

## 2018-02-19 ENCOUNTER — Ambulatory Visit: Payer: PPO | Admitting: Interventional Cardiology

## 2018-02-19 VITALS — BP 104/64 | HR 103 | Ht 59.5 in | Wt 112.6 lb

## 2018-02-19 DIAGNOSIS — I5042 Chronic combined systolic (congestive) and diastolic (congestive) heart failure: Secondary | ICD-10-CM | POA: Diagnosis not present

## 2018-02-19 DIAGNOSIS — I10 Essential (primary) hypertension: Secondary | ICD-10-CM | POA: Diagnosis not present

## 2018-02-19 DIAGNOSIS — I350 Nonrheumatic aortic (valve) stenosis: Secondary | ICD-10-CM | POA: Diagnosis not present

## 2018-02-19 DIAGNOSIS — I25119 Atherosclerotic heart disease of native coronary artery with unspecified angina pectoris: Secondary | ICD-10-CM

## 2018-02-19 DIAGNOSIS — R0609 Other forms of dyspnea: Secondary | ICD-10-CM | POA: Diagnosis not present

## 2018-02-19 DIAGNOSIS — R0600 Dyspnea, unspecified: Secondary | ICD-10-CM

## 2018-02-19 MED ORDER — FUROSEMIDE 40 MG PO TABS: 40.0000 mg | ORAL_TABLET | Freq: Two times a day (BID) | ORAL | 3 refills | Status: DC

## 2018-02-19 NOTE — Patient Instructions (Addendum)
Medication Instructions:  Your physician recommends that you continue on your current medications as directed. Please refer to the Current Medication list given to you today.  A refill was sent in for lasix 40 mg twice a day  If you need a refill on your cardiac medications before your next appointment, please call your pharmacy.   Lab work: TODAY: BMET, BNP  If you have labs (blood work) drawn today and your tests are completely normal, you will receive your results only by: [name] MyChart Message (if you have MyChart) OR . A paper copy in the mail If you have any lab test that is abnormal or we need to change your treatment, we will call you to review the results.  Testing/Procedures: None ordered  Follow-Up: At [facility], you and your health needs are our priority.  As part of our continuing mission to provide you with exceptional heart care, we have created designated Provider Care Teams.  These Care Teams include your primary Cardiologist (physician) and Advanced Practice Providers (APPs -  Physician Assistants and Nurse Practitioners) who all work together to provide you with the care you need, when you need it. . You will need a follow up appointment in May 2020.  Please call our office 2 months in advance to schedule this appointment.  You may see Casandra Doffing, MD or one of the following Advanced Practice Providers on your designated Care Team:   . Lyda Jester, PA-C . Dayna Dunn, PA-C . Ermalinda Barrios, PA-C  Any Other Special Instructions Will Be Listed Below (If Applicable).

## 2018-02-19 NOTE — Progress Notes (Signed)
Cardiology Office Note   Date:  02/19/2018   ID:  Tyshae, Stair 1950-06-23, MRN [id number]  PCP:  Leeroy Cha, MD    No chief complaint on file.  CAD  Wt Readings from Last 3 Encounters:  02/19/18 112 lb 9.6 oz (51.1 kg)  02/14/18 114 lb (51.7 kg)  01/31/18 118 lb (53.5 kg)       History of Present Illness: Teresa [name] is a 67 y.o. female  with CADwho has had a complex left main , circumflex , LAD stenting after a dissection during PCI in 8/14. She had PCI of the RCA complicated by dissection and required 3 stents.Last cath 2018 patent stents in the left main, LAD circumflex and RCA mild aortic valve stenosis LVEF 45 to 50%. She had successful lung surgery to remove a malignancy in 12/14.Quit smoking in 2014.  Patient was hospitalized 10/2017 with CHF treated with IV Lasix. Troponins were negative. 2D echo 11/05/2017 showed LVEF is 40 to 45% with grade 2 DD, severe aortic stenosis mean gradient of 45 mmHg although there was only mild aortic valve stenosis on cardiac cath 06/29/2016.  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 She was started on steroids and albuterol for bronchitis.Diltiazem started for tachycardia in 10/19.  She had PFTs and some improvement in sx.  SHe went back to work.  She then had some more shortness of breath in early November.  BNP was elevated.  She had been treated with several rounds of prednisone.  After having her diuretics increased, she felt much better.   Seen in pulmonary clinic at the end of November: "Symptoms most consistent with CHF exacerbation - BNP 1,573 (2,266) - CXR today showed increased interstitial pulmonary edema with new small left pleural effusion - Instructed to take 15m lasix twice daily until told otherwise  - Needs to call cardiology on monday, I will let them  know as well when labs results  - Advised ED if sob worsens."  She saw pulmonary on 02/14/18, [facility] after desaturating with a  Minute walk.  She has the oxygen and uses it at night time. She has continued with BID Lasix.  On 12/13, she received steroids the injection and she started to cough and bring up mucus.  After this, she felt better.   Past Medical History:  Diagnosis Date  . AAA (abdominal aortic aneurysm) ([facility] [facility]    a. small by cath 2014.  . Arthritis    "across my hips; buttocks; comes w/the weather" (02/27/2013)  . CAD (coronary artery disease), native coronary artery    a. 10/2012 - rotational atherectomy of LAD c/b dissection s/p PCI to LAD, LCx, LM. b. 12/2012 - s/p overlapping DES to RCA.  . Cataract    "just the beginnings on the right" (02/27/2013)  . Cellulitis 06/20/2012   "dog bite; right forearm" (06/24/2012)  . Chronic bronchitis ([place]    "used to have it alot; haven't had it in a long time" (02/27/2013)  . Chronic combined systolic and diastolic CHF (congestive heart failure) ([facility]   . COPD (chronic obstructive pulmonary disease) ([place]    "little bit" (02/27/2013)  . GERD (gastroesophageal reflux disease)   . Hypercholesterolemia   . Ischemic cardiomyopathy    a. EF 45-50%.  . Mass of lung    "small tumor RUL; they  are watching it" (02/27/2013)  . Myocardial infarction [facility])    "think dr said I've had 2 silent one" (02/27/2013)  . Peripheral vascular disease, unspecified ([place])   . Pneumonia 1990's   "once" (02/27/2013)  . Seasonal allergies   . Severe aortic stenosis   . Tachycardia, unspecified   . Tobacco abuse     Past Surgical History:  Procedure Laterality Date  . CORONARY ANGIOPLASTY WITH STENT PLACEMENT  10/2012; 12/08/2012   "7 + 3" (02/27/2013)  . ESOPHAGOGASTRODUODENOSCOPY (EGD) WITH PROPOFOL N/A 01/11/2015   Procedure: ESOPHAGOGASTRODUODENOSCOPY (EGD) WITH PROPOFOL;  Surgeon: Garlan Fair, MD;  Location: [facility]  END[facility]COPY;  Service: Endoscopy;  Laterality: N/A;  . FLEXIBLE BRONCHOSCOPY N/A 03/02/2013   Procedure: FLEXIBLE BRONCHOSCOPY;  Surgeon: Gaye Pollack, MD;  Location: [facility];  Service: Thoracic;  Laterality: N/A;  . FLEXIBLE SIGMOIDOSCOPY N/A 01/11/2015   Procedure: FLEXIBLE SIGMOIDOSCOPY;  Surgeon: Garlan Fair, MD;  Location: [facility] END[facility];  Service: Endoscopy;  Laterality: N/A;  unable to complete colon-prep issues  . GANGLION CYST EXCISION Left 1975   "wrist"  . LEFT HEART CATH AND CORONARY ANGIOGRAPHY N/A 06/29/2016   Procedure: Left Heart Cath and Coronary Angiography;  Surgeon: Jettie Booze, MD;  Location: [facility] CV LAB;  Service: Cardiovascular;  Laterality: N/A;  . LEFT HEART CATHETERIZATION WITH CORONARY ANGIOGRAM N/A 12/08/2012   Procedure: LEFT HEART CATHETERIZATION WITH CORONARY ANGIOGRAM;  Surgeon: Jettie Booze, MD;  Location: [facility] CATH LAB;  Service: Cardiovascular;  Laterality: N/A;  . MOUTH SURGERY  2010?   "for bone loss" (06/24/2012)  . PERCUTANEOUS CORONARY STENT INTERVENTION (PCI-S) N/A 10/09/2012   Procedure: PERCUTANEOUS CORONARY STENT INTERVENTION (PCI-S);  Surgeon: Jettie Booze, MD;  Location: [facility] CATH LAB;  Service: Cardiovascular;  Laterality: N/A;  . THORACOTOMY/LOBECTOMY Right 03/02/2013   Procedure: Right Video Assisted Thoracoscopy/Thoracotomy with upper Lobectomy;  Surgeon: Gaye Pollack, MD;  Location: [facility] OR;  Service: Thoracic;  Laterality: Right;  Right Lung Upper  Lobectomy   . TONSILLECTOMY  1960  . VIDEO BRONCHOSCOPY  09/12/2011   Procedure: VIDEO BRONCHOSCOPY WITH FLUORO;  Surgeon: Tanda Rockers, MD;  Location: [facility];  Service: Cardiopulmonary;  Laterality: Bilateral;     Current Outpatient Medications  Medication Sig Dispense Refill  . albuterol (PROAIR HFA) 108 (90 Base) MCG/ACT inhaler Inhale 2 puffs into the lungs every 6 (six) hours as needed for wheezing or shortness of breath.    . Alirocumab (PRALUENT) 150 MG/ML SOPN  Inject 150 mg into the skin every 14 (fourteen) days. 2 pen 11  . cholecalciferol (VITAMIN D) 1000 UNITS tablet Take 1 tablet (1,000 Units total) by mouth at bedtime. 90 tablet 0  . clopidogrel (PLAVIX) 75 MG tablet Take 1 tablet (75 mg total) by mouth daily. 90 tablet 2  . diltiazem (CARDIZEM CD) 240 MG 24 hr capsule Take 1 capsule (240 mg total) by mouth daily. 90 capsule 3  . fluticasone (FLONASE) 50 MCG/ACT nasal spray Place into both nostrils at bedtime.   4  . furosemide (LASIX) 40 MG tablet Take 1 tablet (40 mg total) by mouth daily. (Patient taking differently: Take 40 mg by mouth 2 (two) times daily. ) 90 tablet 1  . Hypromellose (GENTEAL MILD) 0.2 % SOLN Place 2 drops into both eyes as needed.    . lansoprazole (PREVACID) 30 MG capsule Take 1 capsule (30 mg total) by mouth daily at 12 noon. 90 capsule 3  . levofloxacin (LEVAQUIN) 500 MG tablet Take 1  tablet (500 mg total) by mouth daily. 7 tablet 0  . NASAL SALINE NA Place 1 spray into the nose daily as needed (congestion).     . nitroGLYCERIN (NITROSTAT) 0.4 MG SL tablet Place 1 tablet (0.4 mg total) under the tongue every 5 (five) minutes as needed for chest pain. 25 tablet 4  . OVER THE COUNTER MEDICATION Take 1 tablet by mouth daily with lunch. [facility] Menopause support    . potassium chloride SA (K-DUR,KLOR-CON) 20 MEQ tablet Take 0.5 tablets (10 mEq total) by mouth daily. 90 tablet 1  . predniSONE (DELTASONE) 10 MG tablet Take  4 each am x 2 days,   2 each am x 2 days,  1 each am x 2 days and stop 14 tablet 0  . ranitidine (ZANTAC) 150 MG tablet Take 150 mg by mouth daily as needed for heartburn.     . simethicone (MYLICON) 80 MG chewable tablet Chew 160 mg by mouth every 6 (six) hours as needed for flatulence.    [name] Spacer/Aero-Holding Chambers (AEROCHAMBER MV) inhaler Use as instructed 1 each 0  . Tiotropium Bromide-Olodaterol (STIOLTO RESPIMAT) 2.5-2.5 MCG/ACT AERS Inhale 2 puffs into the lungs daily. 1 Inhaler 11   No current  facility-administered medications for this visit.     Allergies:   Azithromycin; Ceclor [cefaclor]; Lipitor [atorvastatin]; Morphine and related; Penicillins; Septra [sulfamethoxazole-trimethoprim]; Simvastatin; Vicodin [hydrocodone-acetaminophen]; Zetia [ezetimibe]; Doxycycline; and Adhesive [tape]    Social History:  The patient  reports that she quit smoking about 5 years ago. Her smoking use included cigarettes. She has a 20.00 pack-year smoking history. She has never used smokeless tobacco. She reports that she does not drink alcohol or use drugs.   Family History:  The patient's family history includes Alcohol abuse in her father; Lupus in her mother; Migraines in her sister; Other in her sister; Pulmonary fibrosis in her brother.    ROS:  Please see the history of present illness.   Otherwise, review of systems are positive for intermittent shortness of breath.   All other systems are reviewed and negative.    PHYSICAL EXAM: VS:  BP 104/64   Pulse (!) 103   Ht 4' 11.5" (1.511 m)   Wt 112 lb 9.6 oz (51.1 kg)   LMP 03/25/2011   SpO2 91%   BMI 22.36 kg/m  , BMI Body mass index is 22.36 kg/m. GEN: Well nourished, well developed, in no acute distress  HEENT: normal  Neck: no JVD, carotid bruits, or masses Cardiac: RRR-heart rate in the high 90s; no murmurs, rubs, or gallops,no edema  Respiratory:  clear to auscultation bilaterally, normal work of breathing GI: soft, nontender, nondistended, + BS MS: no deformity or atrophy  Skin: warm and dry, no rash Neuro:  Strength and sensation are intact Psych: euthymic mood, full affect      Recent Labs: 10/27/2017: B Natriuretic Peptide 503.8; TSH 1.576 01/02/2018: ALT 10 01/03/2018: Hemoglobin 12.6; Platelets 297 01/31/2018: BUN 20; Creatinine, Ser 0.74; Potassium 4.0; Pro B Natriuretic peptide (BNP) 1,573.0; Sodium 137   Lipid Panel    Component Value Date/Time   CHOL 152 08/18/2015 0840   TRIG 105 08/18/2015 0840   HDL 65  08/18/2015 0840   CHOLHDL 2.3 08/18/2015 0840   VLDL 21 08/18/2015 0840   LDLCALC 66 08/18/2015 0840     Other studies Reviewed: Additional studies/ records that were reviewed today with results demonstrating: Pulmonary notes reviewed.   ASSESSMENT AND PLAN:  1. CAD: No angina.  Continue  aggressive secondary prevention. 2. Aortic stenosis: Has been overestimated by echo when compared to the result at the time of catheterization.  If she has symptoms of severe aortic stenosis, we will plan for repeat cardiac cath. 3. Combined systolic and diastolic heart failure: Difficult to tell when her shortness of breath is either from volume overload versus pulmonary issue.  Chest x-ray in November showed a small pleural effusion.  We will continue the higher dose diuretic for now at 40 mg twice daily.  Check electrolytes today.  Check BNP today given the intermittent shortness of breath.  If the BNP is trending down and electrolytes stable, will continue current dose of diuretics and potassium. 4. HTN: The current medical regimen is effective;  continue present plan and medications.    Current medicines are reviewed at length with the patient today.  The patient concerns regarding her medicines were addressed.  The following changes have been made:  No change  Labs/ tests ordered today include: Be met, BNP No orders of the defined types were placed in this encounter.   Recommend 150 minutes/week of aerobic exercise Low fat, low carb, high fiber diet recommended  Disposition:   FU in 6 months   Signed, Larae Grooms, MD  02/19/2018 4:04 PM    [facility] [address], [place], [place]  [address] Phone: [phone]; Fax: [phone]

## 2018-02-20 LAB — PRO B NATRIURETIC PEPTIDE: NT-PRO BNP: 24530 pg/mL — AB (ref 0–301)

## 2018-02-20 LAB — BASIC METABOLIC PANEL
BUN / CREAT RATIO: 22 (ref 12–28)
BUN: 21 mg/dL (ref 8–27)
CHLORIDE: 91 mmol/L — AB (ref 96–106)
CO2: 31 mmol/L — AB (ref 20–29)
CREATININE: 0.97 mg/dL (ref 0.57–1.00)
Calcium: 9.7 mg/dL (ref 8.7–10.3)
GFR calc Af Amer: 71 mL/min/{1.73_m2} (ref >59)
GFR calc non Af Amer: 61 mL/min/{1.73_m2} (ref >59)
GLUCOSE: 145 mg/dL — AB (ref 65–99)
Potassium: 4 mmol/L (ref 3.5–5.2)
Sodium: 139 mmol/L (ref 134–144)

## 2018-03-03 ENCOUNTER — Telehealth: Payer: Self-pay | Admitting: Interventional Cardiology

## 2018-03-03 NOTE — Telephone Encounter (Signed)
  Patient is waiting for drugstore to get her Prulent sure click so she was wondering if she could get a couple to get her through until the pharmacy gets them in.

## 2018-03-04 NOTE — Telephone Encounter (Signed)
Spoke with pt, she has recently switched to [employer]. Will need to submit new prior authorization for Praluent. Their system is down today, will try again on 1/2. Pt still has 1 Praluent pen left at home so she should not require samples. [employer] should be pricing Praluent at $180 for 3 month supply at a time which pt is ok with.

## 2018-03-06 ENCOUNTER — Other Ambulatory Visit: Payer: Self-pay | Admitting: Pharmacist

## 2018-03-06 MED ORDER — ALIROCUMAB 150 MG/ML ~~LOC~~ SOAJ: 1.0000 "pen " | SUBCUTANEOUS | 3 refills | Status: DC

## 2018-03-06 NOTE — Telephone Encounter (Signed)
Attempted prior auth again, advised Praluent is available without authorization. Rx refill sent to pharmacy for 3 month supply.

## 2018-03-10 ENCOUNTER — Ambulatory Visit: Payer: Commercial Managed Care - PPO | Admitting: Interventional Cardiology

## 2018-03-17 ENCOUNTER — Ambulatory Visit: Payer: Commercial Managed Care - PPO | Admitting: Interventional Cardiology

## 2018-03-21 DIAGNOSIS — J440 Chronic obstructive pulmonary disease with acute lower respiratory infection: Secondary | ICD-10-CM | POA: Diagnosis not present

## 2018-03-28 ENCOUNTER — Ambulatory Visit: Payer: PPO | Admitting: Internal Medicine

## 2018-03-28 ENCOUNTER — Encounter: Payer: Self-pay | Admitting: Internal Medicine

## 2018-03-28 VITALS — BP 112/66 | HR 89 | Ht 59.5 in | Wt 106.4 lb

## 2018-03-28 DIAGNOSIS — J449 Chronic obstructive pulmonary disease, unspecified: Secondary | ICD-10-CM | POA: Diagnosis not present

## 2018-03-28 DIAGNOSIS — J9611 Chronic respiratory failure with hypoxia: Secondary | ICD-10-CM

## 2018-03-28 NOTE — Progress Notes (Signed)
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  In meantime repeat CT Chest c/w evolving increase in density RUL lesion so referred to pulmonary. Has dx of mod to severe AS with most recent echo done 2 year prior to [place]  But yearly eval due w/in a week.  No ex cp or presyncope, denies any limiting sob with desired activities. rec You have a small nodule in right upper lobe that could be a atypical infection or an early tumor     DIAGNOSIS: Stage IB (T2a., N0, M0) poorly differentiated squamous cell carcinoma, diagnosed in December of 2014.  PRIOR THERAPY: Flexible video bronchoscopy, right upper lobectomy with mediastinal lymph node dissection under the care of Dr. Cyndia Bent.   CURRENT THERAPY: Observation as of ov 01/10/16 s any adjuvant rx    01/13/2016   Consultation Teresa [name] re:  Abn ct/ cough x 3 days on gerd rx tiw and cough drops  Chief Complaint  Patient presents with  . Pulmonary Consult    Referred by Dr. Earlie Server for eval of recent chest ct. She c/o cough for the past few days- prod with clear sputum.    Not limited by breathing from desired activities  / chasing toddlers around all  Day s need for albuterol except in the spring and the fall   Imp gold II copd/ uacs  rec When coughing > take prevacid 30 mg Take 30-60 min before first meal of the day and pepcid ac 20 mg at bedtime until you stop coughing GERD rx     12/31/2017 acute extended ov/Teresa [name] re:  Cough / sp ER eval 12/16/17 for  bronchospasm rx prednisone and improved some Chief Complaint  Patient presents with  . Acute Visit    Pt states having "bronchitis and bronchial spasms" since September 2019.  She is "hacking"- non prod cough.   was using proair in aug 2019 3-4 x per week for cough/ wheeze stopped working as well  Sept 2019  and rx pred/advair 12/26/17 helped breathing  a lot but throat started feeling "raw" and dry day > noct cough/ urge to clear throat which is new since started advair 250 dpi Sleep flat bed/ large pillow on side Walked at mall since starting advair nl pace  Not needing saba now  rec Plan A = Automatic = change advair to stiolto 2 pffs each am  Work on inhaler technique:   Plan B = Backup Only use your albuterol as a rescue medication    01/09/18 NP eval rec >>>BNP  =  2,266  Could consider high-res CT in the future Stiolto Respimat inhaler >>>2 puffs daily Only use your albuterol as a rescue medication    01/16/18  Cards eval/ [place] 1. CAD: Feeling much better after diuretics were increased.  Continue aggressive secondary prevention.  If she does develop any chest tightness or concern for angina, we will plan for  cardiac cath.  She will let us know if symptoms change.  LDL 76 in 8/19. 2. Aortic stenosis: No symptoms of severe aortic stenosis.  Preserved S2 on exam. 3. Combined systolic and diastolic heart failure: Much improved with diuretics.  Will decrease Lasix back to 40 mg daily.  I have advised her to take a second dose of Lasix 2 days a week.  She would also need an additional dose of Lasix if she gains more than 3 pounds or knows she is eating something excessively salty.  She has been very compliant with our instructions to reduce salt in her diet.  I suspect this is also help with her fluid retention.  Recheck BNP and BMet today. 4. HTN: The current medical regimen is effective;  continue present plan and medications.       02/14/2018  f/u ov/Teresa [name] re: GOLD  III/aecopd on stiolto 2 each am  Chief Complaint  Patient presents with  . Follow-up    6MW done today. She c/o increased cough 4 days- prod with white sputum. She is using her proair inhaler about 4 x per day.   Dyspnea:  Gradually Worse doe  x 4 days assoc with cough, improved in saba and comfortable at rest sitting Cough: more cough/congestion  Sleeping: in recliner most  Nights esp since 4 days SABA use: none until acute onset  02: none  rec Goal is to keep the 02 sats above 90% at all times  2lpm 24/7 starting today  Depomedrol 120 mg IM today     03/28/2018  f/u ov/Teresa [name] re: copd III maint rx stiolto  Chief Complaint  Patient presents with  . Follow-up    She states on cipro for sinus and ear infection- her congestion and HA she had been having has resolved. She states her breathing is doing well and she has not been using her o2. She has been using her rescue inhaler daily per the request of her PCP.   Dyspnea:  Walking now easier around the daycare and carries garbage out Cough: none now Sleeping: flat bed, one pillow  SABA use: once daily, not as needed, but doesn't feel she really needs it   02: none at all since one day p last ov   No obvious day to day or daytime variability or assoc excess/ purulent sputum or mucus plugs or hemoptysis or cp or chest tightness, subjective wheeze or overt sinus or hb symptoms.   Sleeping as above without nocturnal  or early am exacerbation  of respiratory  c/o's or need for noct saba. Also denies any obvious fluctuation of symptoms with weather or environmental changes or other aggravating or alleviating factors except as outlined above   No unusual exposure hx or h/o childhood pna/ asthma or knowledge of premature birth.  Current Allergies, Complete Past Medical History, Past Surgical History, Family History, and Social History were reviewed in [employer] record.  ROS  The following are not active complaints  unless bolded Hoarseness, sore throat, dysphagia, dental problems, itching, sneezing,  nasal congestion or discharge of excess mucus or purulent secretions, ear ache,   fever, chills, sweats, unintended wt loss or wt gain, classically pleuritic or exertional cp,  orthopnea pnd or arm/hand swelling  or leg swelling, presyncope, palpitations, abdominal pain, anorexia, nausea, vomiting, diarrhea  or change in bowel habits or change in bladder habits, change in stools or change in urine, dysuria, hematuria,  rash, arthralgias, visual complaints, headache, numbness, weakness or ataxia  or problems with walking or coordination,  change in mood or  memory.        Current Meds  Medication Sig  . albuterol (PROAIR HFA) 108 (90 Base) MCG/ACT inhaler Inhale 2 puffs into the lungs every 6 (six) hours as needed for wheezing or shortness of breath.  . Alirocumab (PRALUENT) 150 MG/ML SOAJ Inject 1 pen into the skin every 14 (fourteen) days.  . cholecalciferol (VITAMIN D) 1000 UNITS tablet Take 1 tablet (1,000 Units total) by mouth at bedtime.  . ciprofloxacin (CIPRO) 500 MG tablet Take 500 mg by mouth 2 (two) times daily.  . clopidogrel (PLAVIX) 75 MG tablet Take 1 tablet (75 mg total) by mouth daily.  [name] diltiazem (CARDIZEM CD) 240 MG 24 hr capsule Take 1 capsule (240 mg total) by mouth daily.  . fluticasone (FLONASE) 50 MCG/ACT nasal spray Place into both nostrils at bedtime.   . furosemide (LASIX) 40 MG tablet Take 1 tablet (40 mg total) by mouth 2 (two) times daily.  . Hypromellose (GENTEAL MILD) 0.2 % SOLN Place 2 drops into both eyes as needed.  . lansoprazole (PREVACID) 30 MG capsule Take 1 capsule (30 mg total) by mouth daily at 12 noon.  [name] NASAL SALINE NA Place 1 spray into the nose daily as needed (congestion).   . nitroGLYCERIN (NITROSTAT) 0.4 MG SL tablet Place 1 tablet (0.4 mg total) under the tongue every 5 (five) minutes as needed for chest pain.  [name] OVER THE COUNTER MEDICATION Take 1 tablet by mouth  daily with lunch. [facility] Menopause support  . potassium chloride SA (K-DUR,KLOR-CON) 20 MEQ tablet Take 0.5 tablets (10 mEq total) by mouth daily.  . ranitidine (ZANTAC) 150 MG tablet Take 150 mg by mouth daily as needed for heartburn.   . simethicone (MYLICON) 80 MG chewable tablet Chew 160 mg by mouth every 6 (six) hours as needed for flatulence.  [name] Spacer/Aero-Holding Chambers (AEROCHAMBER MV) inhaler Use as instructed  . Tiotropium Bromide-Olodaterol (STIOLTO RESPIMAT) 2.5-2.5 MCG/ACT AERS Inhale 2 puffs into the lungs daily.                      Objective:   Physical Exam   pleasant amb  wf nad   Wt 125  08/24/11 >  11/07/2011  122> 07/14/2012 123 >   01/13/2016   133 > 12/31/2017  120 >  02/14/2018  114> 03/28/2018   Vital signs reviewed - Note on arrival 02 sats  94% on RA       HEENT: nl dentition / oropharynx. Nl external ear canals without cough reflex -  Mild bilateral non-specific turbinate edema     NECK :  without JVD/Nodes/TM/ nl carotid upstrokes bilaterally   LUNGS: no acc muscle use,  Mild barrel  contour chest wall with bilateral  Distant bs s audible wheeze and  without cough on insp or exp maneuver and mild  Hyperresonant  to  percussion bilaterally     CV:  RRR  no s3  II-III/VI sem but no  increase in P2, and no edema   ABD:  soft and nontender with pos end insp Hoover's  in the supine position. No bruits or organomegaly appreciated, bowel sounds nl  MS:   Nl gait/  ext warm without deformities, calf tenderness, cyanosis or clubbing No obvious joint restrictions   SKIN: warm and dry without lesions    NEURO:  alert, approp, nl sensorium with  no motor or cerebellar deficits apparent.  Assessment & Plan:

## 2018-03-28 NOTE — Patient Instructions (Signed)
Work on inhaler technique:  relax and gently blow all the way out then take a nice smooth deep breath back in, triggering the inhaler at same time you start breathing in.  Hold for up to 5 seconds if you can. Blow out thru nose. Rinse and gargle with water when done     Goal for 02 is to keep > 90%  Saturation  - keep monitoring it especially with exertion    Please schedule a follow up visit in 3 months but call sooner if needed  with all medications /inhalers/ solutions in hand so we can verify exactly what you are taking. This includes all medications from all doctors and over the counters

## 2018-03-29 ENCOUNTER — Encounter: Payer: Self-pay | Admitting: Internal Medicine

## 2018-03-29 NOTE — Assessment & Plan Note (Signed)
Quit smoking July 2014   - PFT's 11/07/2011 FEV1  0.96 (51%) ratio 50 and no better p B2, DLCO 78% - PFT's  09/02/2012  FEV1 1.38 (64 % ) ratio 66    with DLCO  73 % corrects to 93 % for alv volume   - quit smoking 09/2012 - Spirometry 12/31/2017  FEV1 0.8 (43%)  Ratio 61 p advair 250 try stiolto 2 pffs each am and return for pfts 6 weeks   - PFT's  01/08/18   FEV1 0.82 (40 % ) ratio 64  p no % improvement from saba p nothing prior to study - unable to perform  DLCO    02/14/2018  37mw 340 meters with desats > resolved as of 03/28/2018   - 03/28/2018  After extensive coaching inhaler device,  effectiveness =    75% with smi (short Ti)   Pt is Group B in terms of symptom/risk and laba/lama therefore appropriate rx at this point > continue stiolto 2 each am but work on optimal technique

## 2018-03-29 NOTE — Assessment & Plan Note (Addendum)
02/14/2018  Patient Saturations on Room Air at Rest = 94% then  while Ambulating = 84% - On  2 Liters of oxygen while Ambulating = 94% rec 2lpm 24/7 as of 02/14/2018 due to co-existing chf > stopped on her own as of 02/16/18  -  03/28/2018   Walked RA  2 laps @ approx 244ft each @ moderately fast pace  stopped due to end of study, no desats, no sob     As of 03/28/2018 red 02 prn to keep sats > 90% at all times   For whatever reason she has clearly improved so no change rx needed at this point and f/u can b q 3 m, sooner prn   I had an extended discussion with the patient  reviewing all relevant studies completed to date and  lasting 15 to 20 minutes of a 25 minute visit  which included directly observing ambulatory 02 saturation study documented in a/p section of  today's  office note.  See device teaching which extended face to face time for this visit   Each maintenance medication was reviewed in detail including most importantly the difference between maintenance and prns and under what circumstances the prns are to be triggered using an action plan format that is not reflected in the computer generated alphabetically organized AVS.     Please see AVS for specific instructions unique to this visit that I personally wrote and verbalized to the the pt in detail and then reviewed with pt  by my nurse highlighting any changes in therapy recommended at today's visit .

## 2018-04-16 DIAGNOSIS — J441 Chronic obstructive pulmonary disease with (acute) exacerbation: Secondary | ICD-10-CM | POA: Diagnosis not present

## 2018-04-16 DIAGNOSIS — Z1159 Encounter for screening for other viral diseases: Secondary | ICD-10-CM | POA: Diagnosis not present

## 2018-04-23 DIAGNOSIS — I2510 Atherosclerotic heart disease of native coronary artery without angina pectoris: Secondary | ICD-10-CM | POA: Diagnosis not present

## 2018-04-23 DIAGNOSIS — R0600 Dyspnea, unspecified: Secondary | ICD-10-CM | POA: Diagnosis not present

## 2018-04-23 DIAGNOSIS — J441 Chronic obstructive pulmonary disease with (acute) exacerbation: Secondary | ICD-10-CM | POA: Diagnosis not present

## 2018-04-25 ENCOUNTER — Ambulatory Visit: Payer: Commercial Managed Care - PPO | Admitting: Internal Medicine

## 2018-05-08 ENCOUNTER — Telehealth: Payer: Self-pay | Admitting: Internal Medicine

## 2018-05-08 MED ORDER — TIOTROPIUM BROMIDE-OLODATEROL 2.5-2.5 MCG/ACT IN AERS: 2.0000 | INHALATION_SPRAY | Freq: Every day | RESPIRATORY_TRACT | 0 refills | Status: DC

## 2018-05-08 NOTE — Addendum Note (Signed)
Addended by: Amado Coe on: 05/08/2018 01:47 PM   Modules accepted: Orders

## 2018-05-08 NOTE — Telephone Encounter (Signed)
Called patient left message on voicemail as requested due to patient being at work. Let her know samples placed up front for pick up .  Nothing further needed at this time.

## 2018-05-11 ENCOUNTER — Other Ambulatory Visit: Payer: Self-pay | Admitting: Interventional Cardiology

## 2018-05-14 ENCOUNTER — Other Ambulatory Visit: Payer: Self-pay | Admitting: Interventional Cardiology

## 2018-05-15 DIAGNOSIS — J449 Chronic obstructive pulmonary disease, unspecified: Secondary | ICD-10-CM | POA: Diagnosis not present

## 2018-05-15 DIAGNOSIS — K219 Gastro-esophageal reflux disease without esophagitis: Secondary | ICD-10-CM | POA: Diagnosis not present

## 2018-05-15 DIAGNOSIS — M85852 Other specified disorders of bone density and structure, left thigh: Secondary | ICD-10-CM | POA: Diagnosis not present

## 2018-05-16 DIAGNOSIS — R05 Cough: Secondary | ICD-10-CM | POA: Diagnosis not present

## 2018-05-16 DIAGNOSIS — J9611 Chronic respiratory failure with hypoxia: Secondary | ICD-10-CM | POA: Diagnosis not present

## 2018-05-19 ENCOUNTER — Other Ambulatory Visit: Payer: Self-pay | Admitting: Internal Medicine

## 2018-05-19 DIAGNOSIS — M85852 Other specified disorders of bone density and structure, left thigh: Secondary | ICD-10-CM

## 2018-05-28 DIAGNOSIS — J441 Chronic obstructive pulmonary disease with (acute) exacerbation: Secondary | ICD-10-CM | POA: Diagnosis not present

## 2018-05-28 DIAGNOSIS — R0602 Shortness of breath: Secondary | ICD-10-CM | POA: Diagnosis not present

## 2018-06-06 ENCOUNTER — Telehealth: Payer: Self-pay | Admitting: Internal Medicine

## 2018-06-06 NOTE — Telephone Encounter (Signed)
Called and spoke with pt stating to her that at this time we did not have any samples of Stiolto. Pt expressed understanding. Nothing further needed.

## 2018-06-16 DIAGNOSIS — R05 Cough: Secondary | ICD-10-CM | POA: Diagnosis not present

## 2018-06-16 DIAGNOSIS — J9611 Chronic respiratory failure with hypoxia: Secondary | ICD-10-CM | POA: Diagnosis not present

## 2018-06-23 ENCOUNTER — Ambulatory Visit (INDEPENDENT_AMBULATORY_CARE_PROVIDER_SITE_OTHER): Payer: PPO | Admitting: Internal Medicine

## 2018-06-23 ENCOUNTER — Other Ambulatory Visit: Payer: Self-pay

## 2018-06-23 DIAGNOSIS — J449 Chronic obstructive pulmonary disease, unspecified: Secondary | ICD-10-CM | POA: Diagnosis not present

## 2018-06-23 MED ORDER — IPRATROPIUM-ALBUTEROL 0.5-2.5 (3) MG/3ML IN SOLN: 3.0000 mL | Freq: Four times a day (QID) | RESPIRATORY_TRACT | 11 refills | Status: DC

## 2018-06-23 MED ORDER — PREDNISONE 10 MG PO TABS: ORAL_TABLET | ORAL | 0 refills | Status: DC

## 2018-06-23 NOTE — Progress Notes (Signed)
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  In meantime repeat CT Chest c/w evolving increase in density RUL lesion so referred to pulmonary. Has dx of mod to severe AS with most recent echo done 2 year prior to [facility]  But yearly eval due w/in a week.  No ex cp or presyncope, denies any limiting sob with desired activities. rec You have a small nodule in right upper lobe that could be a atypical infection or an early tumor     DIAGNOSIS: Stage IB (T2a., N0, M0) poorly differentiated squamous cell carcinoma, diagnosed in December of 2014.  PRIOR THERAPY: Flexible video bronchoscopy, right upper lobectomy with mediastinal lymph node dissection under the care of Dr. Cyndia Bent.   CURRENT THERAPY: Observation as of ov 01/10/16 s any adjuvant rx    01/13/2016   Consultation Teresa [name] re:  Abn ct/ cough x 3 days on gerd rx tiw and cough drops  Chief Complaint  Patient presents with  . Pulmonary Consult    Referred by Dr. Earlie Server for eval of recent chest ct. She c/o cough for the past few days- prod with clear sputum.    Not limited by breathing from desired activities  / chasing toddlers around all  Day s need for albuterol except in the spring and the fall   Imp gold II copd/ uacs  rec When coughing > take prevacid 30 mg Take 30-60 min before first meal of the day and pepcid ac 20 mg at bedtime until you stop coughing GERD rx     12/31/2017 acute extended ov/Teresa [name] re:  Cough / sp ER eval 12/16/17 for  bronchospasm rx prednisone and improved some Chief Complaint  Patient presents with  . Acute Visit    Pt states having "bronchitis and bronchial spasms" since September 2019.  She is "hacking"- non prod cough.   was using proair in aug 2019 3-4 x per week for cough/ wheeze stopped working as well  Sept 2019  and rx pred/advair 12/26/17 helped breathing  a lot but throat started feeling "raw" and dry day > noct cough/ urge to clear throat which is new since started advair 250 dpi Sleep flat bed/ large pillow on side Walked at mall since starting advair nl pace  Not needing saba now  rec Plan A = Automatic = change advair to stiolto 2 pffs each am  Work on inhaler technique:   Plan B = Backup Only use your albuterol as a rescue medication    01/09/18 NP eval rec >>>BNP  =  2,266  Could consider high-res CT in the future Stiolto Respimat inhaler >>>2 puffs daily Only use your albuterol as a rescue medication    01/16/18  Cards eval/ [place] 1. CAD: Feeling much better after diuretics were increased.  Continue aggressive secondary prevention.  If she does develop any chest tightness or concern for angina, we will plan for  cardiac cath.  She will let us know if symptoms change.  LDL 76 in 8/19. 2. Aortic stenosis: No symptoms of severe aortic stenosis.  Preserved S2 on exam. 3. Combined systolic and diastolic heart failure: Much improved with diuretics.  Will decrease Lasix back to 40 mg daily.  I have advised her to take a second dose of Lasix 2 days a week.  She would also need an additional dose of Lasix if she gains more than 3 pounds or knows she is eating something excessively salty.  She has been very compliant with our instructions to reduce salt in her diet.  I suspect this is also help with her fluid retention.  Recheck BNP and BMet today. 4. HTN: The current medical regimen is effective;  continue present plan and medications.       02/14/2018  f/u ov/Teresa [name] re: GOLD  III/aecopd on stiolto 2 each am  Chief Complaint  Patient presents with  . Follow-up    6MW done today. She c/o increased cough 4 days- prod with white sputum. She is using her proair inhaler about 4 x per day.   Dyspnea:  Gradually Worse doe  x 4 days assoc with cough, improved in saba and comfortable at rest sitting Cough: more cough/congestion  Sleeping: in recliner most  Nights esp since 4 days SABA use: none until acute onset  02: none  rec Goal is to keep the 02 sats above 90% at all times  2lpm 24/7 starting today  Depomedrol 120 mg IM today     03/28/2018  f/u ov/Teresa [name] re: copd III maint rx stiolto  Chief Complaint  Patient presents with  . Follow-up    She states on cipro for sinus and ear infection- her congestion and HA she had been having has resolved. She states her breathing is doing well and she has not been using her o2. She has been using her rescue inhaler daily per the request of her PCP.   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   I discussed the limitations, risks, security and privacy concerns of performing an evaluation and management service by telephone and the availability of in person appointments. I also discussed with the patient that there may be a patient responsible charge related to this service. The patient expressed understanding and agreed to proceed.   History of Present Illness: Had flares in feb and march req duoneb and  prednisone  Dyspnea:  Not limited by breathing from desired activities now but very sedentary  Cough: not now Sleeping: ok bed is flat,  1 thick pillow SABA use: confused re dosing/ naming of meds and  also using nebs interchangeably with stiolto when can't afford the stiolto and not able to find the resp section of her formulary to walk thru alternatives today with me  02: prn    No obvious day to day  or daytime variability or assoc excess/ purulent sputum or mucus plugs or hemoptysis or cp or chest tightness, subjective wheeze or overt sinus or hb symptoms.    Also denies any obvious fluctuation of symptoms with weather or environmental changes or other aggravating or alleviating factors except as outlined above.   Meds reviewed/ med reconciliation completed         Observations/Objective: A bit anxious but good phonation, using full sentences/ no cough during interview    Assessment and Plan: See problem list for active a/p's   Follow Up Instructions: See avs for instructions unique to this ov which includes revised/ updated med list     I discussed the assessment and treatment plan with the patient. The patient was provided an opportunity to ask questions and all were answered. The patient agreed with the plan and demonstrated an understanding of the instructions.   The patient was advised to call back or seek an in-person evaluation if the symptoms worsen or if the condition fails to improve as anticipated.  I provided 30 minutes of non-face-to-face time during this encounter.   Christinia Gully, MD

## 2018-06-23 NOTE — Patient Instructions (Signed)
Duoneb (ipratropium and albuterol) four times a day for now   Prednisone 10 mg tablets take 2 if doing poorly and 1 if doing well with breathing daily   Please schedule a follow up office visit in 2 weeks, sooner if needed  with all medications /inhalers/ solutions in hand so we can verify exactly what you are taking. This includes all medications from all doctors and over the counters - bring your medication formulary

## 2018-06-25 ENCOUNTER — Encounter: Payer: Self-pay | Admitting: Internal Medicine

## 2018-06-25 NOTE — Assessment & Plan Note (Signed)
Quit smoking July 2014   - PFT's 11/07/2011 FEV1  0.96 (51%) ratio 50 and no better p B2, DLCO 78% - PFT's  09/02/2012  FEV1 1.38 (64 % ) ratio 66    with DLCO  73 % corrects to 93 % for alv volume   - quit smoking 09/2012 - Spirometry 12/31/2017  FEV1 0.8 (43%)  Ratio 61 p advair 250 try stiolto 2 pffs each am and return for pfts 6 weeks   - PFT's  01/08/18   FEV1 0.82 (40 % ) ratio 64  p no % improvement from saba p nothing prior to study - unable to perform  DLCO    02/14/2018  6 mw 340 meters with desats > resolved as of 03/28/2018  - 03/28/2018  After extensive coaching inhaler device,  effectiveness =    75% with smi (short Ti)    Severe copd  Group D in terms of symptom/risk and laba/lama/ICS  therefore appropriate rx at this point >>>  Ideal for trelegy or symb/spiriva but not clear she can afford any of them and does much better on pred than off   1) Advised:  formulary restrictions will be an ongoing challenge for the forseable future and I would be happy to pick an alternative if the pt will first  provide me a list of them -  pt  will need to return here for training for any new device that is required eg dpi vs hfa vs respimat.    In the meantime we can always provide samples so that the patient never runs out of any needed respiratory medications.   2) The goal with a chronic steroid dependent illness is always arriving at the lowest effective dose that controls the disease/symptoms and not accepting a set "formula" which is based on statistics or guidelines that don't always take into account patient  variability or the natural hx of the dz in every individual patient, which may well vary over time.  For now therefore I recommend the patient maintain  20 mg until better then 10 mg daily in event breathing gets worse on just duoneb qid pending ov with all meds/ formulary in hand   Each maintenance medication was reviewed in detail including most importantly the difference between  maintenance and as needed and under what circumstances the prns are to be used.  Please see AVS for specific  Instructions which are unique to this visit and I personally typed out  which were reviewed in detail   with the patient and a copy provided.

## 2018-06-27 ENCOUNTER — Ambulatory Visit: Payer: PPO | Admitting: Internal Medicine

## 2018-07-01 ENCOUNTER — Telehealth: Payer: Self-pay

## 2018-07-01 NOTE — Telephone Encounter (Signed)
Virtual Visit Pre-Appointment Phone Call  TELEPHONE CALL NOTE  Teresa [name] has been deemed a candidate for a follow-up tele-health visit to limit community exposure during the Covid-19 pandemic. I spoke with the patient via phone to ensure availability of phone/video source, confirm preferred email & phone number, and discuss instructions and expectations.  I reminded Teresa [name] to be prepared with any vital sign and/or heart rhythm information that could potentially be obtained via home monitoring, at the time of her visit. I reminded Teresa [name] to expect a phone call prior to her visit.  Patient agrees to consent below.  Cleon Gustin, RN 07/01/2018 3:37 PM   FULL LENGTH CONSENT FOR TELE-HEALTH VISIT   I hereby voluntarily request, consent and authorize [facility] and its employed or contracted physicians, physician assistants, nurse practitioners or other licensed health care professionals (the Practitioner), to provide me with telemedicine health care services (the "Services") as deemed necessary by the treating Practitioner. I acknowledge and consent to receive the Services by the Practitioner via telemedicine. I understand that the telemedicine visit will involve communicating with the Practitioner through live audiovisual communication technology and the disclosure of certain medical information by electronic transmission. I acknowledge that I have been given the opportunity to request an in-person assessment or other available alternative prior to the telemedicine visit and am voluntarily participating in the telemedicine visit.  I understand that I have the right to withhold or withdraw my consent to the use of telemedicine in the course of my care at any time, without affecting my right to future care or treatment, and that the Practitioner or I may terminate the telemedicine visit at any time. I understand that I have the right to inspect all information  obtained and/or recorded in the course of the telemedicine visit and may receive copies of available information for a reasonable fee.  I understand that some of the potential risks of receiving the Services via telemedicine include:  [name] Delay or interruption in medical evaluation due to technological equipment failure or disruption; . Information transmitted may not be sufficient (e.g. poor resolution of images) to allow for appropriate medical decision making by the Practitioner; and/or  . In rare instances, security protocols could fail, causing a breach of personal health information.  Furthermore, I acknowledge that it is my responsibility to provide information about my medical history, conditions and care that is complete and accurate to the best of my ability. I acknowledge that Practitioner's advice, recommendations, and/or decision may be based on factors not within their control, such as incomplete or inaccurate data provided by me or distortions of diagnostic images or specimens that may result from electronic transmissions. I understand that the practice of medicine is not an exact science and that Practitioner makes no warranties or guarantees regarding treatment outcomes. I acknowledge that I will receive a copy of this consent concurrently upon execution via email to the email address I last provided but may also request a printed copy by calling the office of [facility].    I understand that my insurance will be billed for this visit.   I have read or had this consent read to me. . I understand the contents of this consent, which adequately explains the benefits and risks of the Services being provided via telemedicine.  . I have been provided ample opportunity to ask questions regarding this consent and the Services and have had my questions answered to my satisfaction. [name] I  give my informed consent for the services to be provided through the use of telemedicine in my medical care   By participating in this telemedicine visit I agree to the above.

## 2018-07-02 ENCOUNTER — Encounter: Payer: Self-pay | Admitting: Physician Assistant

## 2018-07-02 ENCOUNTER — Other Ambulatory Visit: Payer: Self-pay

## 2018-07-02 ENCOUNTER — Telehealth (INDEPENDENT_AMBULATORY_CARE_PROVIDER_SITE_OTHER): Payer: PPO | Admitting: Physician Assistant

## 2018-07-02 VITALS — BP 109/62 | HR 91 | Ht 59.5 in | Wt 98.0 lb

## 2018-07-02 DIAGNOSIS — I5042 Chronic combined systolic (congestive) and diastolic (congestive) heart failure: Secondary | ICD-10-CM | POA: Diagnosis not present

## 2018-07-02 DIAGNOSIS — I10 Essential (primary) hypertension: Secondary | ICD-10-CM

## 2018-07-02 DIAGNOSIS — I2510 Atherosclerotic heart disease of native coronary artery without angina pectoris: Secondary | ICD-10-CM

## 2018-07-02 DIAGNOSIS — I350 Nonrheumatic aortic (valve) stenosis: Secondary | ICD-10-CM

## 2018-07-02 MED ORDER — DILTIAZEM HCL ER COATED BEADS 240 MG PO CP24: 240.0000 mg | ORAL_CAPSULE | Freq: Every day | ORAL | 3 refills | Status: DC

## 2018-07-02 MED ORDER — POTASSIUM CHLORIDE CRYS ER 20 MEQ PO TBCR: 10.0000 meq | EXTENDED_RELEASE_TABLET | Freq: Every day | ORAL | 3 refills | Status: DC

## 2018-07-02 MED ORDER — POTASSIUM CHLORIDE CRYS ER 20 MEQ PO TBCR: EXTENDED_RELEASE_TABLET | ORAL | 3 refills | Status: DC

## 2018-07-02 MED ORDER — CLOPIDOGREL BISULFATE 75 MG PO TABS: ORAL_TABLET | ORAL | 3 refills | Status: DC

## 2018-07-02 MED ORDER — FUROSEMIDE 40 MG PO TABS: 60.0000 mg | ORAL_TABLET | Freq: Every day | ORAL | 3 refills | Status: DC

## 2018-07-02 NOTE — Patient Instructions (Addendum)
Medication Instructions:  Your physician has recommended you make the following change in your medication:   DECREASE: furosemide (lasix) to 60 mg daily  TAKE: potassium (k-dur) 10 mEq daily  If you have 2-3 lb. Weight gain overnight, increased shortness of breath, or leg swelling go back to your original dose   Lab work: None Ordered  If you have labs (blood work) drawn today and your tests are completely normal, you will receive your results only by: [name] MyChart Message (if you have MyChart) OR . A paper copy in the mail If you have any lab test that is abnormal or we need to change your treatment, we will call you to review the results.  Testing/Procedures: None ordered  Follow-Up: . Follow up with Dr. Irish Lack on 10/13/18 at 1:40 PM   Any Other Special Instructions Will Be Listed Below (If Applicable).

## 2018-07-02 NOTE — Progress Notes (Signed)
Virtual Visit via Video Note   This visit type was conducted due to national recommendations for restrictions regarding the COVID-19 Pandemic (e.g. social distancing) in an effort to limit this patient's exposure and mitigate transmission in our community.  Due to her co-morbid illnesses, this patient is at least at moderate risk for complications without adequate follow up.  This format is felt to be most appropriate for this patient at this time.  All issues noted in this document were discussed and addressed.  A limited physical exam was performed with this format.  Please refer to the patient's chart for her consent to telehealth for [facility].   Evaluation Performed:  Follow-up visit  Date:  07/02/2018   ID:  Teresa [name], Teresa [name] [birth year], MRN [id number]  Patient Location: Home Provider Location: Home  PCP:  Leeroy Cha, MD  Cardiologist:  Larae Grooms, MD  Electrophysiologist:  None   Chief Complaint:  F/U  History of Present Illness:    Teresa [name] is a 68 y.o. female with  with CAD who has had a complex left main , circumflex , LAD stenting after a dissection during PCI in 8/14. She had PCI of the RCA complicated by dissection and required 3 stents.  Last cath 2018 patent stents in the left main, LAD circumflex and RCA mild aortic valve stenosis LVEF 45 to 50%.  She had successful lung surgery to remove a malignancy in 12/14.   Quit smoking in 2014. Followed by pulmonary for COPD.   Patient was hospitalized 10/2017 with CHF treated with IV Lasix.  Troponins were negative.  2D echo 11/05/2017 showed LVEF is 40 to 45% with grade 2 DD, severe aortic stenosis mean gradient of 45 mmHg although there was only mild aortic valve stenosis on cardiac cath 06/29/2016. Started on diltiazem 12/2017 for tachycardia.   Recurrent CHF 01/2018. Last saw Dr. Irish Lack 02/19/18 and compensated. Aortic stenosis was overestimated by echo when compared to cath.  Patient had a  lot problem with her lungs and pollen this year but now doing better on nebulizer or prednisone. She was laid off  from childcare.Denies chest pain or edema. Chronic dyspnea on exertion. Has lost 33 lbs.   The patient does not have symptoms concerning for COVID-19 infection (fever, chills, cough, or new shortness of breath).    Past Medical History:  Diagnosis Date  . AAA (abdominal aortic aneurysm) ([place])    a. small by cath 2014.  . Arthritis    "across my hips; buttocks; comes w/the weather" (02/27/2013)  . CAD (coronary artery disease), native coronary artery    a. 10/2012 - rotational atherectomy of LAD c/b dissection s/p PCI to LAD, LCx, LM. b. 12/2012 - s/p overlapping DES to RCA.  . Cataract    "just the beginnings on the right" (02/27/2013)  . Cellulitis 06/20/2012   "dog bite; right forearm" (06/24/2012)  . Chronic bronchitis ([place])    "used to have it alot; haven't had it in a long time" (02/27/2013)  . Chronic combined systolic and diastolic CHF (congestive heart failure) ([place])   . COPD (chronic obstructive pulmonary disease) ([place])    "little bit" (02/27/2013)  . GERD (gastroesophageal reflux disease)   . Hypercholesterolemia   . Ischemic cardiomyopathy    a. EF 45-50%.  . Mass of lung    "small tumor RUL; they are watching it" (02/27/2013)  . Myocardial infarction [facility])    "think dr said I've had 2 silent one" (02/27/2013)  . Peripheral  vascular disease, unspecified ([place])   . Pneumonia 1990's   "once" (02/27/2013)  . Seasonal allergies   . Severe aortic stenosis   . Tachycardia, unspecified   . Tobacco abuse    Past Surgical History:  Procedure Laterality Date  . CORONARY ANGIOPLASTY WITH STENT PLACEMENT  10/2012; 12/08/2012   "7 + 3" (02/27/2013)  . ESOPHAGOGASTRODUODENOSCOPY (EGD) WITH PROPOFOL N/A 01/11/2015   Procedure: ESOPHAGOGASTRODUODENOSCOPY (EGD) WITH PROPOFOL;  Surgeon: Garlan Fair, MD;  Location: [facility];  Service: Endoscopy;  Laterality: N/A;   . FLEXIBLE BRONCHOSCOPY N/A 03/02/2013   Procedure: FLEXIBLE BRONCHOSCOPY;  Surgeon: Gaye Pollack, MD;  Location: [place];  Service: Thoracic;  Laterality: N/A;  . FLEXIBLE SIGMOIDOSCOPY N/A 01/11/2015   Procedure: FLEXIBLE SIGMOIDOSCOPY;  Surgeon: Garlan Fair, MD;  Location: [facility];  Service: Endoscopy;  Laterality: N/A;  unable to complete colon-prep issues  . GANGLION CYST EXCISION Left 1975   "wrist"  . LEFT HEART CATH AND CORONARY ANGIOGRAPHY N/A 06/29/2016   Procedure: Left Heart Cath and Coronary Angiography;  Surgeon: Jettie Booze, MD;  Location: [place] CV LAB;  Service: Cardiovascular;  Laterality: N/A;  . LEFT HEART CATHETERIZATION WITH CORONARY ANGIOGRAM N/A 12/08/2012   Procedure: LEFT HEART CATHETERIZATION WITH CORONARY ANGIOGRAM;  Surgeon: Jettie Booze, MD;  Location: [facility] CATH LAB;  Service: Cardiovascular;  Laterality: N/A;  . MOUTH SURGERY  2010?   "for bone loss" (06/24/2012)  . PERCUTANEOUS CORONARY STENT INTERVENTION (PCI-S) N/A 10/09/2012   Procedure: PERCUTANEOUS CORONARY STENT INTERVENTION (PCI-S);  Surgeon: Jettie Booze, MD;  Location[facility] CATH LAB;  Service: Cardiovascular;  Laterality: N/A;  . THORACOTOMY/LOBECTOMY Right 03/02/2013   Procedure: Right Video Assisted Thoracoscopy/Thoracotomy with upper Lobectomy;  Surgeon: Gaye Pollack, MD;  Location[facility] OR;  Service: Thoracic;  Laterality: Right;  Right Lung Upper  Lobectomy   . TONSILLECTOMY  1960  . VIDEO BRONCHOSCOPY  09/12/2011   Procedure: VIDEO BRONCHOSCOPY WITH FLUORO;  Surgeon: Tanda Rockers, MD;  Location: [facility];  Service: Cardiopulmonary;  Laterality: Bilateral;     Current Meds  Medication Sig  . albuterol (PROAIR HFA) 108 (90 Base) MCG/ACT inhaler Inhale 2 puffs into the lungs every 6 (six) hours as needed for wheezing or shortness of breath.  . Alirocumab (PRALUENT) 150 MG/ML SOAJ Inject 1 pen into the skin every 14 (fourteen) days.  . cholecalciferol (VITAMIN D)  1000 UNITS tablet Take 1 tablet (1,000 Units total) by mouth at bedtime.  . clopidogrel (PLAVIX) 75 MG tablet TAKE 1 TABLET(75 MG) BY MOUTH DAILY  . diltiazem (CARDIZEM CD) 240 MG 24 hr capsule Take 1 capsule (240 mg total) by mouth daily.  . fluticasone (FLONASE) 50 MCG/ACT nasal spray Place into both nostrils at bedtime.   . furosemide (LASIX) 40 MG tablet Take 1.5 tablets (60 mg total) by mouth daily.  . Hypromellose (GENTEAL MILD) 0.2 % SOLN Place 2 drops into both eyes as needed.  [name] ipratropium-albuterol (DUONEB) 0.5-2.5 (3) MG/3ML SOLN Take 3 mLs by nebulization 4 (four) times daily.  . lansoprazole (PREVACID) 30 MG capsule Take 1 capsule (30 mg total) by mouth daily at 12 noon.  [name] NASAL SALINE NA Place 1 spray into the nose daily as needed (congestion).   . nitroGLYCERIN (NITROSTAT) 0.4 MG SL tablet Place 1 tablet (0.4 mg total) under the tongue every 5 (five) minutes as needed for chest pain.  [name] OVER THE COUNTER MEDICATION Take 1 tablet by mouth daily with lunch. [facility] Menopause support  .  potassium chloride SA (K-DUR) 20 MEQ tablet Take 0.5 tablets (10 mEq total) by mouth daily.  . predniSONE (DELTASONE) 10 MG tablet 2 daily until better then 1 daily thereafter  . simethicone (MYLICON) 80 MG chewable tablet Chew 160 mg by mouth every 6 (six) hours as needed for flatulence.  [name] Spacer/Aero-Holding Chambers (AEROCHAMBER MV) inhaler Use as instructed  . [DISCONTINUED] clopidogrel (PLAVIX) 75 MG tablet TAKE 1 TABLET(75 MG) BY MOUTH DAILY  . [DISCONTINUED] diltiazem (CARDIZEM CD) 240 MG 24 hr capsule Take 1 capsule (240 mg total) by mouth daily.  . [DISCONTINUED] furosemide (LASIX) 40 MG tablet Take 1 tablet (40 mg total) by mouth 2 (two) times daily.  . [DISCONTINUED] potassium chloride SA (K-DUR) 20 MEQ tablet TAKE 1 TABLET(20 MEQ) BY MOUTH DAILY  . [DISCONTINUED] potassium chloride SA (K-DUR,KLOR-CON) 20 MEQ tablet TAKE 1 TABLET(20 MEQ) BY MOUTH DAILY     Allergies:   Azithromycin; Ceclor  [cefaclor]; Lipitor [atorvastatin]; Morphine and related; Penicillins; Septra [sulfamethoxazole-trimethoprim]; Simvastatin; Vicodin [hydrocodone-acetaminophen]; Zetia [ezetimibe]; Doxycycline; and Adhesive [tape]   Social History   Tobacco Use  . Smoking status: Former Smoker    Packs/day: 0.50    Years: 40.00    Pack years: 20.00    Types: Cigarettes    Last attempt to quit: 10/08/2012    Years since quitting: 5.7  . Smokeless tobacco: Never Used  Substance Use Topics  . Alcohol use: No  . Drug use: No     Family Hx: The patient's family history includes Alcohol abuse in her father; Lupus in her mother; Migraines in her sister; Other in her sister; Pulmonary fibrosis in her brother. There is no history of Heart attack.  ROS:   Please see the history of present illness.    Review of Systems  Constitution: Negative.  HENT: Negative.   Eyes: Negative.   Cardiovascular: Positive for dyspnea on exertion.  Respiratory: Positive for shortness of breath and wheezing.   Hematologic/Lymphatic: Negative.   Musculoskeletal: Negative.  Negative for joint pain.  Gastrointestinal: Negative.   Genitourinary: Negative.   Neurological: Negative.     All other systems reviewed and are negative.   Prior CV studies:   The following studies were reviewed today:   2D echo 11/05/2017 Study Conclusions   - Procedure narrative: Transthoracic echocardiography. Image   quality was adequate. Intravenous contrast (Definity) was   administered. - Left ventricle: The cavity size was at the upper limits of   normal. Systolic function was mildly to moderately reduced. The   estimated ejection fraction was in the range of 40% to 45%.   Akinesis and scarring of the apicalanteroseptal, anterior, and   apical myocardium; consistent with infarction in the distribution   of the left anterior descending coronary artery. Features are   consistent with a pseudonormal left ventricular filling pattern,   with  concomitant abnormal relaxation and increased filling   pressure (grade 2 diastolic dysfunction). Acoustic contrast   opacification revealed no evidence ofthrombus. - Aortic valve: There was severe stenosis. Mean gradient (S): 45 mm   Hg. Peak gradient (S): 74 mm Hg. - Mitral valve: Calcified annulus. There was mild to moderate   regurgitation directed centrally. - Left atrium: The atrium was mildly dilated. - Atrial septum: No defect or patent foramen ovale was identified. - Pericardium, extracardiac: A trivial pericardial effusion was   identified.   Cardiac catheterization 06/29/2016  Patent stents in the left main, LAD, circumflex, and RCA.  Mild disease in the proximal RCA.  There is mild aortic valve stenosis.  The left ventricular ejection fraction is 45-50% by visual estimate.  LV end diastolic pressure is normal.  There is mild left ventricular systolic dysfunction wth persistent apical hypokinesis.   Continue aggressive secondary prevention. She can likely be discharged home tomorrow.  Consider GI related causes of her chest discomfort.       Labs/Other Tests and Data Reviewed:    EKG:  An ECG dated 01/03/18 was personally reviewed today and demonstrated:  NSR with poor R wave progression  Recent Labs: 10/27/2017: B Natriuretic Peptide 503.8; TSH 1.576 01/02/2018: ALT 10 01/03/2018: Hemoglobin 12.6; Platelets 297 02/19/2018: BUN 21; Creatinine, Ser 0.97; NT-Pro BNP 24,530; Potassium 4.0; Sodium 139   Recent Lipid Panel Lab Results  Component Value Date/Time   CHOL 152 08/18/2015 08:40 AM   TRIG 105 08/18/2015 08:40 AM   HDL 65 08/18/2015 08:40 AM   CHOLHDL 2.3 08/18/2015 08:40 AM   LDLCALC 66 08/18/2015 08:40 AM    Wt Readings from Last 3 Encounters:  07/02/18 98 lb (44.5 kg)  03/28/18 106 lb 6.4 oz (48.3 kg)  02/19/18 112 lb 9.6 oz (51.1 kg)     Objective:    Vital Signs:  BP 109/62   Pulse 91   Ht 4' 11.5" (1.511 m)   Wt 98 lb (44.5 kg)   LMP  03/25/2011   SpO2 93%   BMI 19.46 kg/m    VITAL SIGNS:  reviewed GEN:  no acute distress RESPIRATORY:  normal respiratory effort, symmetric expansion CARDIOVASCULAR:  no peripheral edema  ASSESSMENT & PLAN:    1. Chronic combined systolic and diastolic CHF-doing well and compensated. Has lost over 20 lbs and watching salt closely. Will try to reduce Lasix to 60 mg once daily.Kdur 10 meq once daily. Increase back to regular dose if weight gain, edema or increase shortness of breath. 2. Severe AS on echo but mild at cath- no symptoms 3. CAD S/P complex left main, circumflex and LAD stenting after dissection during PCI RCA in 2014. Follow-up cath 2018 patent stents. No angina 4. Essential HTN BP well controlled 5.Tachycardia in the past felt secondary steroids and albuterol started on diltiazem-no recurrence.   COVID-19 Education: The signs and symptoms of COVID-19 were discussed with the patient and how to seek care for testing (follow up with PCP or arrange E-visit).   The importance of social distancing was discussed today.  Time:   Today, I have spent 25 minutes with the patient with telehealth technology discussing the above problems.     Medication Adjustments/Labs and Tests Ordered: Current medicines are reviewed at length with the patient today.  Concerns regarding medicines are outlined above.   Tests Ordered: No orders of the defined types were placed in this encounter.   Medication Changes: Meds ordered this encounter  Medications  . furosemide (LASIX) 40 MG tablet    Sig: Take 1.5 tablets (60 mg total) by mouth daily.    Dispense:  135 tablet    Refill:  3  . DISCONTD: potassium chloride SA (K-DUR) 20 MEQ tablet    Sig: TAKE 1 TABLET(20 MEQ) BY MOUTH DAILY    Dispense:  90 tablet    Refill:  3  . clopidogrel (PLAVIX) 75 MG tablet    Sig: TAKE 1 TABLET(75 MG) BY MOUTH DAILY    Dispense:  90 tablet    Refill:  3  . diltiazem (CARDIZEM CD) 240 MG 24 hr capsule     Sig: Take  1 capsule (240 mg total) by mouth daily.    Dispense:  90 capsule    Refill:  3  . potassium chloride SA (K-DUR) 20 MEQ tablet    Sig: Take 0.5 tablets (10 mEq total) by mouth daily.    Dispense:  45 tablet    Refill:  3    Dose decrease    Disposition:  Follow up in 3 month(s) Dr. Beau Fanny  Signed, Ermalinda Barrios, PA-C  07/02/2018 11:51 AM    [place]

## 2018-07-07 ENCOUNTER — Encounter: Payer: Self-pay | Admitting: Internal Medicine

## 2018-07-07 ENCOUNTER — Other Ambulatory Visit: Payer: Self-pay

## 2018-07-07 ENCOUNTER — Ambulatory Visit: Payer: PPO | Admitting: Internal Medicine

## 2018-07-07 VITALS — BP 108/66 | HR 94 | Temp 98.2°F | Ht 59.5 in | Wt 101.0 lb

## 2018-07-07 DIAGNOSIS — J449 Chronic obstructive pulmonary disease, unspecified: Secondary | ICD-10-CM

## 2018-07-07 DIAGNOSIS — J9611 Chronic respiratory failure with hypoxia: Secondary | ICD-10-CM | POA: Diagnosis not present

## 2018-07-07 MED ORDER — FLUTICASONE-UMECLIDIN-VILANT 100-62.5-25 MCG/INH IN AEPB: 1.0000 | INHALATION_SPRAY | Freq: Every day | RESPIRATORY_TRACT | 11 refills | Status: DC

## 2018-07-07 MED ORDER — FLUTICASONE-UMECLIDIN-VILANT 100-62.5-25 MCG/INH IN AEPB: 1.0000 | INHALATION_SPRAY | Freq: Every day | RESPIRATORY_TRACT | 0 refills | Status: DC

## 2018-07-07 MED ORDER — ALBUTEROL SULFATE (2.5 MG/3ML) 0.083% IN NEBU: 2.5000 mg | INHALATION_SOLUTION | RESPIRATORY_TRACT | 12 refills | Status: DC | PRN

## 2018-07-07 NOTE — Progress Notes (Signed)
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  In meantime repeat CT Chest c/w evolving increase in density RUL lesion so referred to pulmonary. Has dx of mod to severe AS with most recent echo done 2 year prior to [place]  But yearly eval due w/in a week.  No ex cp or presyncope, denies any limiting sob with desired activities. rec You have a small nodule in right upper lobe that could be a atypical infection or an early tumor     DIAGNOSIS: Stage IB (T2a., N0, M0) poorly differentiated squamous cell carcinoma, diagnosed in December of 2014.  PRIOR THERAPY: Flexible video bronchoscopy, right upper lobectomy with mediastinal lymph node dissection under the care of Dr. Cyndia Bent.   CURRENT THERAPY: Observation as of ov 01/10/16 s any adjuvant rx    01/13/2016   Consultation Teresa [name] re:  Abn ct/ cough x 3 days on gerd rx tiw and cough drops  Chief Complaint  Patient presents with  . Pulmonary Consult    Referred by Dr. Earlie Server for eval of recent chest ct. She c/o cough for the past few days- prod with clear sputum.    Not limited by breathing from desired activities  / chasing toddlers around all  Day s need for albuterol except in the spring and the fall   Imp gold II copd/ uacs  rec When coughing > take prevacid 30 mg Take 30-60 min before first meal of the day and pepcid ac 20 mg at bedtime until you stop coughing GERD rx     12/31/2017 acute extended ov/Teresa [name] re:  Cough / sp ER eval 12/16/17 for  bronchospasm rx prednisone and improved some Chief Complaint  Patient presents with  . Acute Visit    Pt states having "bronchitis and bronchial spasms" since September 2019.  She is "hacking"- non prod cough.   was using proair in aug 2019 3-4 x per week for cough/ wheeze stopped working as well  Sept 2019  and rx pred/advair 12/26/17 helped breathing  a lot but throat started feeling "raw" and dry day > noct cough/ urge to clear throat which is new since started advair 250 dpi Sleep flat bed/ large pillow on side Walked at mall since starting advair nl pace  Not needing saba now  rec Plan A = Automatic = change advair to stiolto 2 pffs each am  Work on inhaler technique:   Plan B = Backup Only use your albuterol as a rescue medication    01/09/18 NP eval rec >>>BNP  =  2,266  Could consider high-res CT in the future Stiolto Respimat inhaler >>>2 puffs daily Only use your albuterol as a rescue medication    01/16/18  Cards eval/ [place] 1. CAD: Feeling much better after diuretics were increased.  Continue aggressive secondary prevention.  If she does develop any chest tightness or concern for angina, we will plan for  cardiac cath.  She will let us know if symptoms change.  LDL 76 in 8/19. 2. Aortic stenosis: No symptoms of severe aortic stenosis.  Preserved S2 on exam. 3. Combined systolic and diastolic heart failure: Much improved with diuretics.  Will decrease Lasix back to 40 mg daily.  I have advised her to take a second dose of Lasix 2 days a week.  She would also need an additional dose of Lasix if she gains more than 3 pounds or knows she is eating something excessively salty.  She has been very compliant with our instructions to reduce salt in her diet.  I suspect this is also help with her fluid retention.  Recheck BNP and BMet today. 4. HTN: The current medical regimen is effective;  continue present plan and medications.      03/28/2018  f/u ov/Teresa [name] re: copd III maint  rx stiolto  Chief Complaint  Patient presents with  . Follow-up    She states on cipro for sinus and ear infection- her congestion and HA she had been having has resolved. She states her breathing is doing well and she has not been using her o2. She has been using her rescue inhaler daily per the request of her PCP.   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 Follow-up    Breathing is doing overall doing well. She has not had to use proair, but uses neb 3 x per day.   Dyspnea:  Garbage to street / baseline down street and back s stopping / slow pace = MMRC2 = can't walk a nl pace on a flat grade s sob but does fine slow and flat  Cough: min p neb Sleeping: 30 degrees  SABA use: no proair since started duoneb  02: 2lpm bedtime/ sats ok walking s 02    No obvious day to day or daytime variability or assoc excess/ purulent sputum or mucus plugs or hemoptysis or cp or chest tightness, subjective wheeze or overt sinus or hb symptoms.   Sleeping as above  without nocturnal  or early am exacerbation  of respiratory  c/o's or need for noct saba. Also denies any obvious fluctuation of symptoms with weather or environmental changes or other aggravating or alleviating factors except as outlined above   No unusual exposure hx or h/o childhood pna/ asthma or knowledge of premature birth.  Current Allergies, Complete Past Medical History, Past Surgical History,  Family History, and Social History were reviewed in [employer] record.  ROS  The following are not active complaints unless bolded Hoarseness, sore throat, dysphagia, dental problems, itching, sneezing,  nasal congestion or discharge of excess mucus or purulent secretions, ear ache,   fever, chills, sweats, unintended wt loss or wt gain, classically pleuritic or exertional cp,  orthopnea pnd or arm/hand swelling  or leg  swelling, presyncope, palpitations, abdominal pain, anorexia, nausea, vomiting, diarrhea  or change in bowel habits or change in bladder habits, change in stools or change in urine, dysuria, hematuria,  rash, arthralgias, visual complaints, headache, numbness, weakness or ataxia or problems with walking or coordination,  change in mood or  memory.        Current Meds  Medication Sig  . albuterol (PROAIR HFA) 108 (90 Base) MCG/ACT inhaler Inhale 2 puffs into the lungs every 6 (six) hours as needed for wheezing or shortness of breath.  . Alirocumab (PRALUENT) 150 MG/ML SOAJ Inject 1 pen into the skin every 14 (fourteen) days.  . cholecalciferol (VITAMIN D) 1000 UNITS tablet Take 1 tablet (1,000 Units total) by mouth at bedtime.  . clopidogrel (PLAVIX) 75 MG tablet TAKE 1 TABLET(75 MG) BY MOUTH DAILY  . Dextromethorphan-guaiFENesin (TUSSIN DM) 10-100 MG/5ML liquid Take 5 mLs by mouth every 12 (twelve) hours.  [name] diltiazem (CARDIZEM CD) 240 MG 24 hr capsule Take 1 capsule (240 mg total) by mouth daily.  . fluticasone (FLONASE) 50 MCG/ACT nasal spray Place into both nostrils at bedtime.   . furosemide (LASIX) 40 MG tablet Take 1.5 tablets (60 mg total) by mouth daily.  . Hypromellose (GENTEAL MILD) 0.2 % SOLN Place 2 drops into both eyes as needed.  [name] ibuprofen (ADVIL) 200 MG tablet Take 200 mg by mouth every 6 (six) hours as needed.  [name] ipratropium-albuterol (DUONEB) 0.5-2.5 (3) MG/3ML SOLN Take 3 mLs by nebulization 4 (four) times daily.  . lansoprazole  (PREVACID) 30 MG capsule Take 1 capsule (30 mg total) by mouth daily at 12 noon.  [name] NASAL SALINE NA Place 1 spray into the nose daily as needed (congestion).   . nitroGLYCERIN (NITROSTAT) 0.4 MG SL tablet Place 1 tablet (0.4 mg total) under the tongue every 5 (five) minutes as needed for chest pain.  [name] OVER THE COUNTER MEDICATION Take 1 tablet by mouth daily with lunch. [facility] Menopause support  . OXYGEN 2lpm with sleep  . potassium chloride SA (K-DUR) 20 MEQ tablet Take 0.5 tablets (10 mEq total) by mouth daily.  . predniSONE (DELTASONE) 10 MG tablet 2 daily until better then 1 daily thereafter  . pseudoephedrine-acetaminophen (TYLENOL SINUS) 30-500 MG TABS tablet Take 1 tablet by mouth every 4 (four) hours as needed.  . ranitidine (ZANTAC) 150 MG capsule Take 150 mg by mouth at bedtime as needed for heartburn.  . simethicone (MYLICON) 80 MG chewable tablet Chew 160 mg by mouth every 6 (six) hours as needed for flatulence.  [name] Spacer/Aero-Holding Chambers (AEROCHAMBER MV) inhaler Use as instructed         Objective:   Physical Exam  amb energetic wf nad   Wt 125  08/24/11 >  11/07/2011  122> 07/14/2012 123 >   01/13/2016   133 > 12/31/2017  120 >  02/14/2018  114> 03/28/2018 > 07/07/2018  101   Vital signs reviewed - Note on arrival 02 sats  95% on RA   HEENT: nl dentition / oropharynx. Nl external ear canals without cough reflex -  Mild bilateral non-specific turbinate edema     NECK :  without JVD/Nodes/TM/ nl carotid upstrokes bilaterally   LUNGS: no acc muscle use,  Mod barrel  contour chest wall with bilateral  Distant bs s audible wheeze and  without cough on insp or exp maneuver and mild  Hyperresonant  to  percussion bilaterally     CV:  RRR  Distant S1S2  no s3  or murmur or increase in P2, and no edema   ABD:  soft and nontender with pos mid insp Hoover's  in the supine position. No bruits or organomegaly appreciated, bowel sounds nl  MS:   Nl gait/  ext warm without deformities,  calf tenderness, cyanosis or clubbing No obvious joint restrictions   SKIN: warm and dry without lesions    NEURO:  alert, approp, nl sensorium with  no motor or cerebellar deficits apparent.           Assessment & Plan:

## 2018-07-07 NOTE — Assessment & Plan Note (Signed)
02/14/2018  Patient Saturations on Room Air at Rest = 94% then  while Ambulating = 84% - On  2 Liters of oxygen while Ambulating = 94% rec 2lpm 24/7 as of 02/14/2018 due to co-existing chf > stopped on her own as of 02/16/18  -  03/28/2018   Walked RA  2 laps @ approx 243ft each @ moderately fast pace  stopped due to end of study, no desats, no sob     As of 07/07/2018 rec 02  2llpm hs and prn for sats < 90% walking   I had an extended discussion with the patient reviewing all relevant studies completed to date and  lasting 15 to 20 minutes of a 25 minute visit    See device teaching which extended face to face time for this visit.  Each maintenance medication was reviewed in detail including emphasizing most importantly the difference between maintenance and prns and under what circumstances the prns are to be triggered using an action plan format that is not reflected in the computer generated alphabetically organized AVS which I have not found useful in most complex patients, especially with respiratory illnesses  Please see AVS for specific instructions unique to this visit that I personally wrote and verbalized to the the pt in detail and then reviewed with pt  by my nurse highlighting any  changes in therapy recommended at today's visit to their plan of care.

## 2018-07-07 NOTE — Assessment & Plan Note (Addendum)
Quit smoking July 2014   - PFT's 11/07/2011 FEV1  0.96 (51%) ratio 50 and no better p B2, DLCO 78% - PFT's  09/02/2012  FEV1 1.38 (64 % ) ratio 66    with DLCO  73 % corrects to 93 % for alv volume   - quit smoking 09/2012 - Spirometry 12/31/2017  FEV1 0.8 (43%)  Ratio 61 p advair 250 try stiolto 2 pffs each am and return for pfts 6 weeks   - PFT's  01/08/18   FEV1 0.82 (40 % ) ratio 64  p no % improvement from saba p nothing prior to study - unable to perform  DLCO    02/14/2018  37mw 340 meters with desats > resolved as of 03/28/2018  - 03/28/2018  After extensive coaching inhaler device,  effectiveness =    75% with smi (short Ti)  - 06/23/18 started pred daily  - 07/07/2018  After extensive coaching inhaler device,  effectiveness =    90% with elipta so try trelegy one click am as already failed anoro and trelegy is on her formulary     Group D in terms of symptom/risk and laba/lama/ICS  therefore appropriate rx at this point >>>  trelegy best alternative on her formulary but also has plan A = proair and Plan C= change duoneb to albuterol so the ipatropium in duoneb doesn't compete for the M3 receptor with trelegy lama and try taper prednisone to floor of 5 mg daily with ceiling of 20 mg until returns for recheck    Advised:  formulary restrictions will be an ongoing challenge for the forseable future and I would be happy to pick an alternative if the pt will first  provide me a list of them -  pt  will need to return here for training for any new device that is required eg dpi vs hfa vs respimat.    In the meantime we can always provide samples so that the patient never runs out of any needed respiratory medications.   F/u in one month to check how she's doing and taper off pred if possible

## 2018-07-07 NOTE — Patient Instructions (Addendum)
Plan A = Automatic = Trelegy one daily  - one click and take two separate deep drags   Plan B = Backup Only use your albuterol inhaler as a rescue medication to be used if you can't catch your breath by resting or doing a relaxed purse lip breathing pattern.  - The less you use it, the better it will work when you need it. - Ok to use the inhaler up to 2 puffs  every 4 hours if you must but call for appointment if use goes up over your usual need - Don't leave home without it !!  (think of it like the spare tire for your car)   Plan C = Crisis - only use your albuterol nebulizer if you first try Plan B and it fails to help > ok to use the nebulizer up to every 4 hours but if start needing it regularly call for immediate appointment   Predisone ceiling is 20 mg daily and the floor is 5 mg daily    Please schedule a follow up office visit in 4 weeks, sooner if needed  with all medications /inhalers/ solutions in hand so we can verify exactly what you are taking. This includes all medications from all doctors and over the counters

## 2018-07-08 ENCOUNTER — Telehealth: Payer: Self-pay | Admitting: Physician Assistant

## 2018-07-08 NOTE — Telephone Encounter (Signed)
Pt c/o swelling: STAT is pt has developed SOB within 24 hours  1) How much weight have you gained and in what time span? 3 lbs in one week  2) If swelling, where is the swelling located? ankles  3) Are you currently taking a fluid pill? yes  4) Are you currently SOB? no  5) Do you have a log of your daily weights (if so, list)?   6) Have you gained 3 pounds in a day or 5 pounds in a week? no  7) Have you traveled recently? No  She had a televisit with Ermalinda Barrios on April 30th, and she had change her fluid pill.

## 2018-07-08 NOTE — Telephone Encounter (Signed)
Agree. If she continues to have swelling she can go back to 40 mg BID and previous potassium dose as well. Thanks

## 2018-07-08 NOTE — Telephone Encounter (Signed)
Called and spoke to patient. She states that she had gained 3 pounds in 1 week and has had some increased swelling in her ankles. Denies increased SOB. Her lasix was decreased to 60 mg QD and k-dur to 10 mEq QD. Patient states that she got up to 101 lbs yesterday but today she is down to 100 lbs and the swelling in her ankles has improved today. She has been taking the lasix 40 AM and 20 at lunch time. She states that she avoids salt in her diet. Instructed for the patient to continue to monitor and if she has >3 lb weight gain in 24 hours or more than 5 lbs in 1 week or if she has increased swelling or SOB she may take the extra 20 mg at lunch time AS NEEDED. Instructed for patient to let us know if she is having to take the extra 20 mg on a regular basis. Patient verbalized understanding and thanked me for the call.

## 2018-07-16 DIAGNOSIS — R05 Cough: Secondary | ICD-10-CM | POA: Diagnosis not present

## 2018-07-16 DIAGNOSIS — J9611 Chronic respiratory failure with hypoxia: Secondary | ICD-10-CM | POA: Diagnosis not present

## 2018-07-17 ENCOUNTER — Telehealth: Payer: Self-pay | Admitting: Interventional Cardiology

## 2018-07-17 ENCOUNTER — Telehealth: Payer: Self-pay | Admitting: Internal Medicine

## 2018-07-17 MED ORDER — POTASSIUM CHLORIDE CRYS ER 20 MEQ PO TBCR: 20.0000 meq | EXTENDED_RELEASE_TABLET | Freq: Every day | ORAL | 3 refills | Status: DC

## 2018-07-17 MED ORDER — FUROSEMIDE 40 MG PO TABS: 40.0000 mg | ORAL_TABLET | Freq: Two times a day (BID) | ORAL | 3 refills | Status: DC

## 2018-07-17 NOTE — Telephone Encounter (Signed)
R/T pt call She has question on prednisone Trelegy and gerd?  Reminded to rinse mouth out thoroughly after each use Pt currently  At 5 mg on prednisone. 1/4 of 20mg  tablet. She is just want to be sure she was following direction correctly. Pt states she is weaning off prednisone Nothing further needed.     Predisone ceiling is 20 mg daily and the floor is 5 mg daily

## 2018-07-17 NOTE — Telephone Encounter (Signed)
Perfect. She can continue on the higher dose lasix and potassium. thanks

## 2018-07-17 NOTE — Telephone Encounter (Signed)
Pt c/o swelling: STAT is pt has developed SOB within 24 hours  1) How much weight have you gained and in what time span? Yes, sometimes 1 to 2lbs  2) If swelling, where is the swelling located feet and ankles  3) Are you currently taking a fluid pill? Furosemide  4) Are you currently SOB? No, but a little sometimes  5) Do you have a log of your daily weights (if so, list)? yes  6) Have you gained 3 pounds in a day or 5 pounds in a week? no  7) Have you traveled recently? no

## 2018-07-17 NOTE — Telephone Encounter (Signed)
Called and spoke to patient. She states that she continued to have increased swelling in her legs so she went back to taking lasix 40 mg BID and k-dur 20 mEq QD and has been taking it that way since Monday. She states that her SOB is intermittent and has not worsened. She states that her weight has been fluctuating up and down. She was 98 lbs on 4/29 and went up to 100 lbs and and stayed there for a while she states that her weight gotten up to 102 lbs yesterday but she is back down to 100 lbs today. Instructed patient to continue lasix 40 mg BID and k-dur 20 mEq QD and continue to limit salt. Patient has not been elevating her legs and does not use compression stockings. Instructed patient to elevate legs and wear compression stockings to assist with swelling. Patient verbalized understanding and will let us know if her Sx change or worsen.

## 2018-07-23 ENCOUNTER — Ambulatory Visit: Payer: PPO | Admitting: Interventional Cardiology

## 2018-08-01 ENCOUNTER — Telehealth: Payer: Self-pay | Admitting: Interventional Cardiology

## 2018-08-01 NOTE — Telephone Encounter (Signed)
Patient is requesting a letter to be out permanently. Will send it to patient's MyChart. Patient can have Dr. Melvyn Novas print it out for her on Monday if she cannot.

## 2018-08-01 NOTE — Telephone Encounter (Signed)
New Message           Patient wants to discuss the stocking that she was told to wear, would like to talk with [place]. Patient also wants to go back to work but not sure if she can or not. Pls call to advise.

## 2018-08-01 NOTE — Telephone Encounter (Signed)
I agree with patient that she is at higher risk for the coronavirus given age, CAD, CHF, COPD. If she needs a note to stay out longer or permanently I'm happy to write one or Dr. Melvyn Novas could on Monday. Let me know. Thanks, Selinda Eon

## 2018-08-01 NOTE — Telephone Encounter (Signed)
Called and spoke to patient. She states that she has been wearing the compression stocking and her LE swelling has improved. She states that her weight is staying right at 100 lbs. She states that her SOB is intermittent and is not sure if that is from her COPD. She states that she did require oxygen recently. 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 She states that she feels like she is still too weak to do this and does not feel that she is able to pick up, carry, and care for the babies. She also is worried about the virus given her age and health issues. Patient states that she has an appointment with Dr. Melvyn Novas on Monday and she has to go to her job after and let them know what we both say about her returning to work. Made patient aware that I will forward to Ermalinda Barrios, PA for review and recommendation.

## 2018-08-04 ENCOUNTER — Ambulatory Visit: Payer: PPO | Admitting: Internal Medicine

## 2018-08-04 ENCOUNTER — Ambulatory Visit (INDEPENDENT_AMBULATORY_CARE_PROVIDER_SITE_OTHER): Payer: PPO

## 2018-08-04 ENCOUNTER — Encounter: Payer: Self-pay | Admitting: Internal Medicine

## 2018-08-04 ENCOUNTER — Other Ambulatory Visit: Payer: Self-pay | Admitting: Internal Medicine

## 2018-08-04 ENCOUNTER — Other Ambulatory Visit: Payer: Self-pay

## 2018-08-04 ENCOUNTER — Telehealth: Payer: Self-pay | Admitting: Interventional Cardiology

## 2018-08-04 DIAGNOSIS — J449 Chronic obstructive pulmonary disease, unspecified: Secondary | ICD-10-CM | POA: Diagnosis not present

## 2018-08-04 DIAGNOSIS — R0600 Dyspnea, unspecified: Secondary | ICD-10-CM

## 2018-08-04 DIAGNOSIS — J9611 Chronic respiratory failure with hypoxia: Secondary | ICD-10-CM

## 2018-08-04 DIAGNOSIS — I350 Nonrheumatic aortic (valve) stenosis: Secondary | ICD-10-CM

## 2018-08-04 DIAGNOSIS — R0609 Other forms of dyspnea: Secondary | ICD-10-CM

## 2018-08-04 DIAGNOSIS — R0602 Shortness of breath: Secondary | ICD-10-CM | POA: Diagnosis not present

## 2018-08-04 HISTORY — PX: CARDIAC CATHETERIZATION: SHX172

## 2018-08-04 LAB — TSH: TSH: 1.36 u[IU]/mL (ref 0.35–4.50)

## 2018-08-04 LAB — CBC WITH DIFFERENTIAL/PLATELET
Basophils Absolute: 0.1 10*3/uL (ref 0.0–0.1)
Basophils Relative: 0.6 % (ref 0.0–3.0)
Eosinophils Absolute: 0 10*3/uL (ref 0.0–0.7)
Eosinophils Relative: 0.3 % (ref 0.0–5.0)
HCT: 36.4 % (ref 36.0–46.0)
Hemoglobin: 11.9 g/dL — ABNORMAL LOW (ref 12.0–15.0)
Lymphocytes Relative: 8 % — ABNORMAL LOW (ref 12.0–46.0)
Lymphs Abs: 0.8 10*3/uL (ref 0.7–4.0)
MCHC: 32.9 g/dL (ref 30.0–36.0)
MCV: 88 fl (ref 78.0–100.0)
Monocytes Absolute: 0.4 10*3/uL (ref 0.1–1.0)
Monocytes Relative: 3.7 % (ref 3.0–12.0)
Neutro Abs: 8.8 10*3/uL — ABNORMAL HIGH (ref 1.4–7.7)
Neutrophils Relative %: 87.4 % — ABNORMAL HIGH (ref 43.0–77.0)
Platelets: 363 10*3/uL (ref 150.0–400.0)
RBC: 4.13 Mil/uL (ref 3.87–5.11)
RDW: 15.7 % — ABNORMAL HIGH (ref 11.5–15.5)
WBC: 10 10*3/uL (ref 4.0–10.5)

## 2018-08-04 LAB — BASIC METABOLIC PANEL
BUN: 19 mg/dL (ref 6–23)
CO2: 31 mEq/L (ref 19–32)
Calcium: 9.4 mg/dL (ref 8.4–10.5)
Chloride: 94 mEq/L — ABNORMAL LOW (ref 96–112)
Creatinine, Ser: 0.7 mg/dL (ref 0.40–1.20)
GFR: 83.86 mL/min (ref >60.00)
Glucose, Bld: 141 mg/dL — ABNORMAL HIGH (ref 70–99)
Potassium: 4.2 mEq/L (ref 3.5–5.1)
Sodium: 138 mEq/L (ref 135–145)

## 2018-08-04 LAB — BRAIN NATRIURETIC PEPTIDE: Pro B Natriuretic peptide (BNP): >4974 pg/mL — ABNORMAL HIGH (ref 0.0–100.0)

## 2018-08-04 NOTE — Telephone Encounter (Signed)
Called and made patient aware that I will have triage RN print 2 copies of the 08/01/18 letter and leave them downstairs for her to pick up from the screening table since I am working remotely today. Patient verbalized understanding and thanked for the call.

## 2018-08-04 NOTE — Progress Notes (Signed)
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  In meantime repeat CT Chest c/w evolving increase in density RUL lesion so referred to pulmonary. Has dx of mod to severe AS with most recent echo done 2 year prior to [facility]  But yearly eval due w/in a week.  No ex cp or presyncope, denies any limiting sob with desired activities. rec You have a small nodule in right upper lobe that could be a atypical infection or an early tumor     DIAGNOSIS: Stage IB (T2a., N0, M0) poorly differentiated squamous cell carcinoma, diagnosed in December of 2014.  PRIOR THERAPY: Flexible video bronchoscopy, right upper lobectomy with mediastinal lymph node dissection under the care of Dr. Cyndia Bent.   CURRENT THERAPY: Observation as of ov 01/10/16 s any adjuvant rx    01/13/2016   Consultation [place] re:  Abn ct/ cough x 3 days on gerd rx tiw and cough drops  Chief Complaint  Patient presents with  . Pulmonary Consult    Referred by Dr. Earlie Server for eval of recent chest ct. She c/o cough for the past few days- prod with clear sputum.    Not limited by breathing from desired activities  / chasing toddlers around all  Day s need for albuterol except in the spring and the fall   Imp gold II copd/ uacs  rec When coughing > take prevacid 30 mg Take 30-60 min before first meal of the day and pepcid ac 20 mg at bedtime until you stop coughing GERD rx     12/31/2017 acute extended ov/[employer] re:  Cough / sp ER eval 12/16/17 for  bronchospasm rx prednisone and improved some Chief Complaint  Patient presents with  . Acute Visit    Pt states having "bronchitis and bronchial spasms" since September 2019.  She is "hacking"- non prod cough.   was using proair in aug 2019 3-4 x per week for cough/ wheeze stopped working as well  Sept 2019  and rx pred/advair 12/26/17 helped breathing  a lot but throat started feeling "raw" and dry day > noct cough/ urge to clear throat which is new since started advair 250 dpi Sleep flat bed/ large pillow on side Walked at mall since starting advair nl pace  Not needing saba now  rec Plan A = Automatic = change advair to stiolto 2 pffs each am  Work on inhaler technique:   Plan B = Backup Only use your albuterol as a rescue medication    01/09/18 NP eval rec >>>BNP  =  2,266  Could consider high-res CT in the future Stiolto Respimat inhaler >>>2 puffs daily Only use your albuterol as a rescue medication    01/16/18  Cards eval/ [place] 1. CAD: Feeling much better after diuretics were increased.  Continue aggressive secondary prevention.  If she does develop any chest tightness or concern for angina, we will plan for  cardiac cath.  She will let us know if symptoms change.  LDL 76 in 8/19. 2. Aortic stenosis: No symptoms of severe aortic stenosis.  Preserved S2 on exam. 3. Combined systolic and diastolic heart failure: Much improved with diuretics.  Will decrease Lasix back to 40 mg daily.  I have advised her to take a second dose of Lasix 2 days a week.  She would also need an additional dose of Lasix if she gains more than 3 pounds or knows she is eating something excessively salty.  She has been very compliant with our instructions to reduce salt in her diet.  I suspect this is also help with her fluid retention.  Recheck BNP and BMet today. 4. HTN: The current medical regimen is effective;  continue present plan and medications.      03/28/2018  f/u ov/Tykiera Raven re: copd III maint  rx stiolto  Chief Complaint  Patient presents with  . Follow-up    She states on cipro for sinus and ear infection- her congestion and HA she had been having has resolved. She states her breathing is doing well and she has not been using her o2. She has been using her rescue inhaler daily per the request of her PCP.   Dyspnea:  Walking now easier around the daycare and carries garbage out Cough: none now Sleeping: flat bed, one pillow  SABA use: once daily, not as needed, but doesn't feel she really needs it   02: none at all since one day p last ov rec  No change rx    06/23/2018 Telemedicine ov / stiolto no longer covered  rec Duoneb (ipratropium and albuterol) four times a day for now  Prednisone 10 mg tablets take 2 if doing poorly and 1 if doing well with breathing daily     07/07/2018  f/u ov/Dariann Huckaba re: copd III/ better on pred now at 10 mg daily  Chief Complaint  Patient presents with  . Follow-up    Breathing is doing overall doing well. She has not had to use proair, but uses neb 3 x per day.   Dyspnea:  Garbage to street / baseline down street and back s stopping / slow pace = MMRC2 = can't walk a nl pace on a flat grade s sob but does fine slow and flat  Cough: min p neb Sleeping: 30 degrees  SABA use: no proair since started duoneb  02: 2lpm bedtime/ sats ok walking s 02  rec Plan A = Automatic = Trelegy one daily  - one click and take two separate deep drags  Plan B = Backup Only use your albuterol inhaler as a rescue medication  Plan C = Crisis - only use your albuterol nebulizer if you first try Plan B and it fails to help  Predisone ceiling is 20 mg daily and the floor is 5 mg daily  Please schedule a follow up office visit in 4 weeks, sooner if needed  with all medications /inhalers/ solutions in hand so we can verify exactly what you are taking. This includes all medications from all doctors and over the counters    08/04/2018  f/u ov/Riki Berninger re:  COPD III/  prednisone 10 mg one daily  And trelegy and freq saba  Chief Complaint  Patient presents with  . Follow-up    Breathing has been worse x 2 wks. She c/o chest tightness. She has been using her rescue inhaler more often.    Dyspnea:  10 min walking  s stopping with sats in 90s s 02 now  Cough: none Sleeping: 30 degrees SABA use: qid saba hfa typically with activity  no neb 02: prn not using hs    No obvious day to day or daytime variability or assoc excess/ purulent sputum or mucus plugs or hemoptysis or cp or chest tightness, subjective wheeze or overt sinus or hb symptoms.   Sleeping as above without nocturnal  or early am exacerbation  of respiratory  c/o's or need for noct saba. Also denies any obvious fluctuation of symptoms with weather or environmental changes or other aggravating or alleviating factors except as outlined above   No unusual exposure hx or h/o childhood pna/ asthma or knowledge of premature birth.  Current Allergies, Complete Past Medical History, Past Surgical History, Family History, and Social History were reviewed in [employer] [employer] record.  ROS  The following are not active complaints unless bolded Hoarseness, sore throat, dysphagia, dental problems, itching, sneezing,  nasal congestion or discharge of excess mucus or purulent secretions, ear ache,   fever, chills, sweats, unintended wt loss or wt gain, classically pleuritic or exertional cp,  orthopnea pnd or arm/hand swelling  or leg swelling better with tight elastic stockings, presyncope, palpitations, abdominal pain, anorexia, nausea, vomiting, diarrhea  or change in bowel habits or change in bladder habits, change in stools or change in urine, dysuria, hematuria,  rash, arthralgias, visual complaints, headache, numbness, weakness or ataxia or problems with walking or coordination,  change in mood or  memory.        Current Meds  Medication Sig  . albuterol (PROAIR HFA) 108 (90 Base) MCG/ACT  inhaler Inhale 2 puffs into the lungs every 6 (six) hours as needed for wheezing or shortness of breath.  [name] albuterol (PROVENTIL) (2.5 MG/3ML) 0.083% nebulizer solution Take 3 mLs (2.5 mg total) by nebulization every 4 (four) hours as needed for wheezing or shortness of breath.  . Alirocumab (PRALUENT) 150 MG/ML SOAJ Inject 1 pen into the skin every 14 (fourteen) days.  . cholecalciferol (VITAMIN D) 1000 UNITS tablet Take 1 tablet (1,000 Units total) by mouth at bedtime.  . clopidogrel (PLAVIX) 75 MG tablet TAKE 1 TABLET(75 MG) BY MOUTH DAILY  . diltiazem (CARDIZEM CD) 240 MG 24 hr capsule Take 1 capsule (240 mg total) by mouth daily.  . fluticasone (FLONASE) 50 MCG/ACT nasal spray Place into both nostrils at bedtime.   . Fluticasone-Umeclidin-Vilant (TRELEGY ELLIPTA) 100-62.5-25 MCG/INH AEPB Inhale 1 puff into the lungs daily.  . furosemide (LASIX) 40 MG tablet Take 1 tablet (40 mg total) by mouth 2 (two) times daily.  . Hypromellose (GENTEAL MILD) 0.2 % SOLN Place 2 drops into both eyes as needed.  [name] ibuprofen (ADVIL) 200 MG tablet Take 200 mg by mouth every 6 (six) hours as needed.  . lansoprazole (PREVACID) 30 MG capsule Take 1 capsule (30 mg total) by mouth daily at 12 noon.  [name] NASAL SALINE NA Place 1 spray into the nose daily as needed (congestion).   . nitroGLYCERIN (NITROSTAT) 0.4 MG SL tablet Place 1 tablet (0.4 mg total) under the tongue every 5 (five) minutes as needed for chest pain.  [name] OVER THE COUNTER MEDICATION Take 1 tablet by mouth daily with lunch. [facility] Menopause support  . OXYGEN 2lpm with sleep  . potassium chloride SA (K-DUR) 20 MEQ tablet Take 1 tablet (20 mEq total) by mouth daily.  . predniSONE (DELTASONE) 10 MG tablet 2 daily until better then 1 daily thereafter  .  pseudoephedrine-acetaminophen (TYLENOL SINUS) 30-500 MG TABS tablet Take 1 tablet by mouth every 4 (four) hours as needed.  . ranitidine (ZANTAC) 150 MG capsule Take 150 mg by mouth at bedtime as needed for  heartburn.  . simethicone (MYLICON) 80 MG chewable tablet Chew 160 mg by mouth every 6 (six) hours as needed for flatulence.  [name] Spacer/Aero-Holding Chambers (AEROCHAMBER MV) inhaler Use as instructed           Objective:   Physical Exam  amb anxious wf nad   Wt 125  08/24/11 >  11/07/2011  122> 07/14/2012 123 >   01/13/2016   133 > 12/31/2017  120 >  02/14/2018  114> 03/28/2018 > 07/07/2018  101 > 08/04/2018   Vital signs reviewed - Note on arrival 02 sats  96% on RA     HEENT: nl dentition / oropharynx. Nl external ear canals without cough reflex -  Mild/mod bilateral non-specific turbinate edema     NECK :  without JVD/Nodes/TM/ nl carotid upstrokes bilaterally   LUNGS: no acc muscle use,  Mild/mod barrel  contour chest wall with bilateral  Distant bs s audible wheeze and  without cough on insp or exp maneuver and mild  Hyperresonant  to  percussion bilaterally     CV:  RRR  no s3  With III/VI sem s   increase in P2, and wearing elastic hose   ABD:  soft and nontender with pos end insp Hoover's  in the supine position. No bruits or organomegaly appreciated, bowel sounds nl  MS:   Nl gait/  ext warm without deformities, calf tenderness, cyanosis or clubbing No obvious joint restrictions   SKIN: warm and dry without lesions    NEURO:  alert, approp, nl sensorium with  no motor or cerebellar deficits apparent.     CXR PA and Lateral:   08/04/2018 :    I personally reviewed images and agree with radiology impression as follows:   Progressive bilateral pulmonary opacities with a peripheral distribution. Findings may represent progressive interstitial lung disease, however an acute process is not excluded. Vascular congestion.   Labs ordered/ reviewed:      Chemistry      Component Value Date/Time   NA 138 08/04/2018 0927   NA 139 02/19/2018 1635   NA 140 01/02/2017 0737   K 4.2 08/04/2018 0927   K 4.4 01/02/2017 0737   CL 94 (L) 08/04/2018 0927   CO2 31 08/04/2018 0927    CO2 29 01/02/2017 0737   BUN 19 08/04/2018 0927   BUN 21 02/19/2018 1635   BUN 17.2 01/02/2017 0737   CREATININE 0.70 08/04/2018 0927   CREATININE 0.7 01/02/2017 0737      Component Value Date/Time   CALCIUM 9.4 08/04/2018 0927   CALCIUM 9.7 01/02/2017 0737   ALKPHOS 84 01/02/2018 1046   ALKPHOS 82 01/02/2017 0737   AST 14 (L) 01/02/2018 1046   AST 16 01/02/2017 0737   ALT 10 01/02/2018 1046   ALT 13 01/02/2017 0737   BILITOT 0.8 01/02/2018 1046   BILITOT 0.76 01/02/2017 0737        Lab Results  Component Value Date   WBC 10.0 08/04/2018   HGB 11.9 (L) 08/04/2018   HCT 36.4 08/04/2018   MCV 88.0 08/04/2018   PLT 363.0 08/04/2018     No results found for: DDIMER    Lab Results  Component Value Date   TSH 1.36 08/04/2018     Lab Results  Component Value  Date   PROBNP >4974.0 (H) 08/04/2018                    Assessment & Plan:

## 2018-08-04 NOTE — Assessment & Plan Note (Signed)
Quit smoking July 2014   - PFT's 11/07/2011 FEV1  0.96 (51%) ratio 50 and no better p B2, DLCO 78% - PFT's  09/02/2012  FEV1 1.38 (64 % ) ratio 66    with DLCO  73 % corrects to 93 % for alv volume   - quit smoking 09/2012 - Spirometry 12/31/2017  FEV1 0.8 (43%)  Ratio 61 p advair 250 try stiolto 2 pffs each am and return for pfts 6 weeks   - PFT's  01/08/18   FEV1 0.82 (40 % ) ratio 64  p no % improvement from saba p nothing prior to study - unable to perform  DLCO    02/14/2018  27mw 340 meters with desats > resolved as of 03/28/2018  - 03/28/2018  After extensive coaching inhaler device,  effectiveness =    75% with smi (short Ti)  - 06/23/18 started pred daily  - 07/07/2018  After extensive coaching inhaler device,  effectiveness =    90% with elipta so try trelegy one click am as already failed anoro and trelegy is on her formulary  - 08/04/2018  After extensive coaching inhaler device,  effectiveness =    50% short Ti - alpha one Screen 08/04/2018   At this point not clear at all that COPD or asthma are contributing to her dyspnea and will continue to work to minimize the amount of prednisone needed for that component of the problem acknowledging that it sometimes is very difficult to tease it out in a pt with potential for cardiac asthma which I believe is the case here.

## 2018-08-04 NOTE — Patient Instructions (Addendum)
No change in medications for now   Please remember to go to the lab and x-ray department   for your tests - we will call you with the results when they are available.     Please schedule a follow up office visit in 4 weeks, sooner if needed  Late add: Recommended in extra dose of Lasix daily,  echocardiogram as soon as possible with cardiology to follow as well.

## 2018-08-04 NOTE — Assessment & Plan Note (Addendum)
Strongly suspect this is the mechanism driving her dyspnea and interstitial lung changes at this point.  >>>>  Recommend she take an extra dose of Lasix daily  and will try to arrange for echocardiogram and cardiology follow-up as soon as possible.   I had an extended discussion with the patient reviewing all relevant studies completed to date and  lasting 15 to 20 minutes of a 25 minute visit    Each maintenance medication was reviewed in detail including most importantly the difference between maintenance and prns and under what circumstances the prns are to be triggered using an action plan format that is not reflected in the computer generated alphabetically organized AVS.     Please see AVS for specific instructions unique to this visit that I personally wrote and verbalized to the the Teresa [name] in detail and then reviewed with Teresa [name]  by my nurse highlighting any  changes in therapy recommended at today's visit to their plan of care.

## 2018-08-04 NOTE — Assessment & Plan Note (Signed)
6 no evidence of anemia thyroid disease or renal insufficiency or abnormal bicarb.  See aortic stenosis given the fact that her BNP is so high relative to previous findings.

## 2018-08-04 NOTE — Progress Notes (Signed)
Spoke with pt and notified of results per Dr. Melvyn Novas. Pt verbalized understanding and denied any questions. Ordered ECHO stat

## 2018-08-04 NOTE — Telephone Encounter (Signed)
New Message   Patient states she needs to pick letter up from the office if you can print 2 of them for her.  Dr. Melvyn Novas will not provide patient with the letter, and she's unable to get it from my chart.

## 2018-08-04 NOTE — Assessment & Plan Note (Addendum)
02/14/2018  Patient Saturations on Room Air at Rest = 94% then  while Ambulating = 84% - On  2 Liters of oxygen while Ambulating = 94% rec 2lpm 24/7 as of 02/14/2018 due to co-existing chf > stopped on her own as of 02/16/18  -  03/28/2018   Walked RA  2 laps @ approx 291ft each @ moderately fast pace  stopped due to end of study, no desats, no sob     As of 08/04/2018 rec 02  2llpm hs and prn for sats < 90% walking

## 2018-08-08 ENCOUNTER — Telehealth: Payer: Self-pay | Admitting: Interventional Cardiology

## 2018-08-08 NOTE — Telephone Encounter (Signed)
Late entry:  Called and spoke to patient. She wanted to make sure Dr. Irish Lack knew about her lab results and her CXR results ordered by Dr. Melvyn Novas. She wanted to make sure that we were aware that she was scheduled for an echo on 6/9. Made patient aware that Dr. Irish Lack was aware and agreed with the plan.

## 2018-08-08 NOTE — Telephone Encounter (Signed)
New message:   Patient calling concerning her chest xray's that is ready threw her My Chart. Please call patient.

## 2018-08-10 LAB — ALPHA-1 ANTITRYPSIN PHENOTYPE: A-1 Antitrypsin, Ser: 214 mg/dL — ABNORMAL HIGH (ref 83–199)

## 2018-08-11 ENCOUNTER — Telehealth (HOSPITAL_COMMUNITY): Payer: Self-pay | Admitting: *Deleted

## 2018-08-11 NOTE — Telephone Encounter (Signed)
COVID-19 Pre-Screening Questions:  . Do you currently have a fever? NO (yes = cancel and refer to pcp for e-visit) . Have you recently travelled on a cruise, internationally, or to [place], [place], [place], [place], [place], or [place], [place] ([employer]) ? NO (yes = cancel, stay home, monitor symptoms, and contact pcp or initiate e-visit if symptoms develop) . Have you been in contact with someone that is currently pending confirmation of Covid19 testing or has been confirmed to have the Covid19 virus?  NO (yes = cancel, stay home, away from tested individual, monitor symptoms, and contact pcp or initiate e-visit if symptoms develop) . Are you currently experiencing fatigue or cough? NO (yes = pt should be prepared to have a mask placed at the time of their visit).   . Reiterated no additional visitors. . Arrive no earlier than 15 minutes before appointment time. . Please bring own mask.  Teresa [name] 

## 2018-08-11 NOTE — H&P (View-Only) (Signed)
Virtual Visit via Telephone Note   This visit type was conducted due to national recommendations for restrictions regarding the COVID-19 Pandemic (e.g. social distancing) in an effort to limit this patient's exposure and mitigate transmission in our community.  Due to her co-morbid illnesses, this patient is at least at moderate risk for complications without adequate follow up.  This format is felt to be most appropriate for this patient at this time.  The patient did not have access to video technology/had technical difficulties with video requiring transitioning to audio format only (telephone).  All issues noted in this document were discussed and addressed.  No physical exam could be performed with this format.  Please refer to the patient's chart for her  consent to telehealth for [facility]   Date:  08/12/2018   ID:  Teresa [name], DOB [birth year], MRN [id number]  Patient Location: Home Provider Location: Home  PCP:  Leeroy Cha, MD  Cardiologist:  Larae Grooms, MD   Electrophysiologist:  None   Evaluation Performed:  Follow-Up Visit  Chief Complaint:  Acute CHF  History of Present Illness:    Teresa [name] is a 68 y.o. female with coronary artery disease s/p stenting to the RCA in 4287 complicated by dissection requiring stenting of the LM, LAD and LCx, aortic stenosis, lung cancer s/p resection, combined systolic and diastolic CHF, COPD.  Her aortic stenosis has been overestimated on echo when compared to her last Cardiac Catheterization in 2018.  At that time, she had mild aortic stenosis on cath and all stents were patent.  She was last seen by Ermalinda Barrios, PA-C in 06/2018.   She was recently seen by Dr. Melvyn Novas for shortness of breath and a CXR showed vascular congestion and a pro BNP was > 4974.  She has been set up for an echocardiogram and follow up with me.  When she saw Dr. Melvyn Novas, her Lasix was adjusted.  She is now taking Lasix 40 mg 3 times a day.   She tells me that her breathing has improved somewhat.  Her prednisone was also increased.  She has slept on an incline chronically without significant change.  She was having issues with paroxysmal nocturnal dyspnea.  She is not had any further episodes since she increased her Lasix.  She still has shortness of breath with some activities.  She also reports bendopnea.  her weight has not changed.  Her lower extremity swelling has not changed.  She has had some indigestion-like chest pain at night that is relieved by antacids.  She has not had any exertional chest pain or syncope.  The patient does not have symptoms concerning for COVID-19 infection (fever, chills, cough, or new shortness of breath).    Past Medical History:  Diagnosis Date   AAA (abdominal aortic aneurysm) ([place])    a. small by cath 2014.   Arthritis    "across my hips; buttocks; comes w/the weather" (02/27/2013)   CAD (coronary artery disease), native coronary artery    a. 10/2012 - rotational atherectomy of LAD c/b dissection s/p PCI to LAD, LCx, LM. b. 12/2012 - s/p overlapping DES to RCA.   Cataract    "just the beginnings on the right" (02/27/2013)   Cellulitis 06/20/2012   "dog bite; right forearm" (06/24/2012)   Chronic bronchitis ([place])    "used to have it alot; haven't had it in a long time" (02/27/2013)   Chronic combined systolic and diastolic CHF (congestive heart failure) ([place])  COPD (chronic obstructive pulmonary disease) ([place])    "little bit" (02/27/2013)   GERD (gastroesophageal reflux disease)    Hypercholesterolemia    Ischemic cardiomyopathy    a. EF 45-50%.   Mass of lung    "small tumor RUL; they are watching it" (02/27/2013)   Myocardial infarction [facility])    "think dr said I've had 2 silent one" (02/27/2013)   Peripheral vascular disease, unspecified ([place])    Pneumonia 1990's   "once" (02/27/2013)   Seasonal allergies    Severe aortic stenosis    Tachycardia, unspecified     Tobacco abuse    Past Surgical History:  Procedure Laterality Date   CORONARY ANGIOPLASTY WITH STENT PLACEMENT  10/2012; 12/08/2012   "7 + 3" (02/27/2013)   ESOPHAGOGASTRODUODENOSCOPY (EGD) WITH PROPOFOL N/A 01/11/2015   Procedure: ESOPHAGOGASTRODUODENOSCOPY (EGD) WITH PROPOFOL;  Surgeon: Garlan Fair, MD;  Location: [facility];  Service: Endoscopy;  Laterality: N/A;   FLEXIBLE BRONCHOSCOPY N/A 03/02/2013   Procedure: FLEXIBLE BRONCHOSCOPY;  Surgeon: Gaye Pollack, MD;  Location: [place];  Service: Thoracic;  Laterality: N/A;   FLEXIBLE SIGMOIDOSCOPY N/A 01/11/2015   Procedure: FLEXIBLE SIGMOIDOSCOPY;  Surgeon: Garlan Fair, MD;  Location: [facility];  Service: Endoscopy;  Laterality: N/A;  unable to complete colon-prep issues   GANGLION CYST EXCISION Left 1975   "wrist"   LEFT HEART CATH AND CORONARY ANGIOGRAPHY N/A 06/29/2016   Procedure: Left Heart Cath and Coronary Angiography;  Surgeon: Jettie Booze, MD;  Location: [place] CV LAB;  Service: Cardiovascular;  Laterality: N/A;   LEFT HEART CATHETERIZATION WITH CORONARY ANGIOGRAM N/A 12/08/2012   Procedure: LEFT HEART CATHETERIZATION WITH CORONARY ANGIOGRAM;  Surgeon: Jettie Booze, MD;  Location: [facility] CATH LAB;  Service: Cardiovascular;  Laterality: N/A;   MOUTH SURGERY  2010?   "for bone loss" (06/24/2012)   PERCUTANEOUS CORONARY STENT INTERVENTION (PCI-S) N/A 10/09/2012   Procedure: PERCUTANEOUS CORONARY STENT INTERVENTION (PCI-S);  Surgeon: Jettie Booze, MD;  Location: [facility] CATH LAB;  Service: Cardiovascular;  Laterality: N/A;   THORACOTOMY/LOBECTOMY Right 03/02/2013   Procedure: Right Video Assisted Thoracoscopy/Thoracotomy with upper Lobectomy;  Surgeon: Gaye Pollack, MD;  Location: [facility] OR;  Service: Thoracic;  Laterality: Right;  Right Lung Upper  Lobectomy    TONSILLECTOMY  1960   VIDEO BRONCHOSCOPY  09/12/2011   Procedure: VIDEO BRONCHOSCOPY WITH FLUORO;  Surgeon: Tanda Rockers, MD;  Location:  [facility];  Service: Cardiopulmonary;  Laterality: Bilateral;     Current Meds  Medication Sig   albuterol (PROAIR HFA) 108 (90 Base) MCG/ACT inhaler Inhale 2 puffs into the lungs every 6 (six) hours as needed for wheezing or shortness of breath.   albuterol (PROVENTIL) (2.5 MG/3ML) 0.083% nebulizer solution Take 3 mLs (2.5 mg total) by nebulization every 4 (four) hours as needed for wheezing or shortness of breath.   Alirocumab (PRALUENT) 150 MG/ML SOAJ Inject 1 pen into the skin every 14 (fourteen) days.   Cholecalciferol 25 MCG (1000 UT) tablet Take 1,000 Units by mouth daily.   clopidogrel (PLAVIX) 75 MG tablet TAKE 1 TABLET(75 MG) BY MOUTH DAILY   diltiazem (CARDIZEM CD) 240 MG 24 hr capsule Take 1 capsule (240 mg total) by mouth daily.   fluticasone (FLONASE) 50 MCG/ACT nasal spray Place into both nostrils at bedtime.    Fluticasone-Umeclidin-Vilant (TRELEGY ELLIPTA) 100-62.5-25 MCG/INH AEPB Inhale 1 puff into the lungs daily.   furosemide (LASIX) 40 MG tablet Take 40 mg by mouth 3 (three) times daily.  Hypromellose (GENTEAL MILD) 0.2 % SOLN Place 2 drops into both eyes as needed.   ibuprofen (ADVIL) 200 MG tablet Take 200 mg by mouth every 6 (six) hours as needed.   lansoprazole (PREVACID) 30 MG capsule Take 1 capsule (30 mg total) by mouth daily at 12 noon.   NASAL SALINE NA Place 1 spray into the nose daily as needed (congestion).    nitroGLYCERIN (NITROSTAT) 0.4 MG SL tablet Place 1 tablet (0.4 mg total) under the tongue every 5 (five) minutes as needed for chest pain.   OVER THE COUNTER MEDICATION Take 1 tablet by mouth daily with lunch. [facility] Menopause support   OXYGEN 2lpm with sleep   potassium chloride SA (K-DUR) 20 MEQ tablet Take 1 tablet (20 mEq total) by mouth daily.   predniSONE (DELTASONE) 10 MG tablet 2 daily until better then 1 daily thereafter   pseudoephedrine-acetaminophen (TYLENOL SINUS) 30-500 MG TABS tablet Take 1 tablet by mouth every 4 (four)  hours as needed.   ranitidine (ZANTAC) 150 MG capsule Take 150 mg by mouth at bedtime as needed for heartburn.   simethicone (MYLICON) 80 MG chewable tablet Chew 160 mg by mouth every 6 (six) hours as needed for flatulence.   Spacer/Aero-Holding Chambers (AEROCHAMBER MV) inhaler Use as instructed     Allergies:   Azithromycin; Ceclor [cefaclor]; Lipitor [atorvastatin]; Morphine and related; Penicillins; Septra [sulfamethoxazole-trimethoprim]; Simvastatin; Vicodin [hydrocodone-acetaminophen]; Zetia [ezetimibe]; Doxycycline; and Adhesive [tape]   Social History   Tobacco Use   Smoking status: Former Smoker    Packs/day: 0.50    Years: 40.00    Pack years: 20.00    Types: Cigarettes    Last attempt to quit: 10/08/2012    Years since quitting: 5.8   Smokeless tobacco: Never Used  Substance Use Topics   Alcohol use: No   Drug use: No     Family Hx: The patient's family history includes Alcohol abuse in her father; Lupus in her mother; Migraines in her sister; Other in her sister; Pulmonary fibrosis in her brother. There is no history of Heart attack.  ROS:   Please see the history of present illness.    She has not had any bleeding issues.  She has an occasional cough.  She has not had wheezing. All other systems reviewed and are negative.   Prior CV studies:   The following studies were reviewed today:  Echo 08/12/2018  1. Dyskinesis of the left ventricular apex. Hypokinesis of the anterior wall and anterior septum.  2. The left ventricle has severely reduced systolic function, with an ejection fraction of 25-30%. The cavity size was moderately dilated. Left ventricular diastolic Doppler parameters are consistent with pseudonormalization. Elevated mean left atrial  pressure Left ventricular diffuse hypokinesis.  3. The right ventricle has mildly reduced systolic function. The cavity was normal. There is moderately increased right ventricular wall thickness. Right ventricular  systolic pressure is moderately elevated with an estimated pressure of 55.3 mmHg.  4. Left atrial size was mild-moderately dilated.  5. Right atrial size was mildly dilated.  6. Mild thickening of the mitral valve leaflet. There is moderate mitral annular calcification present. Mitral valve regurgitation is severe by color flow Doppler. The MR jet is eccentric laterally directed. No evidence of mitral valve stenosis.  7. The aortic valve is tricuspid. Severely thickening of the aortic valve. Severe calcifcation of the aortic valve. Aortic valve regurgitation is trivial by color flow Doppler.  8. Severe aortic stenosis: mean gradient 47 mm Hg, calculated AV  area 0.35 cm sq.  9. Tricuspid valve regurgitation is mild-moderate. 10. The aortic root and ascending aorta are normal in size and structure. 11. When compared to the prior study: Compared to September 2019, aortic stenosis has worsened, the left ventricle is now dilated and severely depressed and there is marked worsening of mitral regurgitation, which is now severe.   Echo 11/05/17 EF 40-45, ant-sept/ant/apical AK, Gr 2 DD, severe AS (mean 45, peak 47), MAC, mild to mod MR, mild LAE, trivial eff  Echo 07/04/16 EF 45-50, apical AK, Gr 1 DD, severe AS (mean 52, peak 80, MAC  Cardiac Catheterization 06/29/16 LM ost stent patent LAD ost stent patent LCx ost stent patent, prox 20 RCA prox 25, mid stent patent EF 45-50 AV mean 23.9 mmHg (mild AS)      Labs/Other Tests and Data Reviewed:    EKG:  No ECG reviewed.  CXR 08/04/2018 IMPRESSION: Progressive bilateral pulmonary opacities with a peripheral distribution. Findings may represent progressive interstitial lung disease, however an acute process is not excluded.  Vascular congestion.  Recent Labs: 10/27/2017: B Natriuretic Peptide 503.8 01/02/2018: ALT 10 08/04/2018: BUN 19; Creatinine, Ser 0.70; Hemoglobin 11.9; Platelets 363.0; Potassium 4.2; Pro B Natriuretic peptide (BNP)  >4974.0; Sodium 138; TSH 1.36   Recent Lipid Panel Lab Results  Component Value Date/Time   CHOL 152 08/18/2015 08:40 AM   TRIG 105 08/18/2015 08:40 AM   HDL 65 08/18/2015 08:40 AM   CHOLHDL 2.3 08/18/2015 08:40 AM   LDLCALC 66 08/18/2015 08:40 AM    Wt Readings from Last 3 Encounters:  08/12/18 103 lb (46.7 kg)  08/04/18 103 lb 6.4 oz (46.9 kg)  07/07/18 101 lb (45.8 kg)     Objective:    Vital Signs:  BP 110/65    Pulse 95    Ht 4' 11.5" (1.511 m)    Wt 103 lb (46.7 kg)    LMP 03/25/2011    SpO2 94%    BMI 20.46 kg/m    VITAL SIGNS:  reviewed GEN:  no acute distress EYES:  EOMI RESPIRATORY:  Normal respiratory effort NEURO:  alert and oriented x 3, no obvious focal deficit PSYCH:  normal affect  ASSESSMENT & PLAN:     Acute on chronic combined systolic and diastolic CHF (congestive heart failure) ([place]) She was seen in the office in April.  Her Lasix dose was reduced at that time.  She feels that her breathing started to worsen sometime after this.  EF by echo in 9/19 was 40-45 with grade 2 diastolic dysfunction.  Her aortic valve gradient has been overestimated by echocardiograms in the past.  She has mild aortic stenosis by cardiac catheterization in 2018 with mean gradient 24.  She had an echocardiogram arranged which was performed earlier today.  The results are still pending.  Overall, she is NYHA 2 b-3a.  Her volume status has improved slightly since her Lasix dose was increased.  I will arrange a follow-up BMET and BNP.  I will consider adjusting her Lasix further versus changing her to torsemide based upon these results.  I have reminded her to limit her salt and to limit her fluid intake to 2000 mL per day.  She can change her Lasix to 80 mg in the morning and 40 mg in the afternoon for now.  I will see her again in follow-up in 1 week.  Aortic valve stenosis, etiology of cardiac valve disease unspecified As noted, her gradient is overestimated by echocardiograms.  She  does have a 2D echocardiogram pending from today.  I will review this further with Dr. Irish Lack if possible.  If not, I will try to see if Dr. Burt Knack or Dr. Angelena Form can review it with me.  She may need cardiac catheterization to further evaluate as well as formal evaluation in the structural heart clinic.  ADDENDUM: I was called by Dr. Sallyanne Kuster after my visit with Ms. Hackel.  He read her echo.  Her echo today demonstrates an EF of 25-30, a dilated LV with severe MR and severe AS with AVA 0.35 and mean gradient 47.  LVOT/AV ratio is 0.11.  I also spoke with Dr. Irish Lack.  He recommends the patient undergo a R and L heart cath to further evaluate her aortic stenosis and LV dysfunction.  It may be that her MR is related to annular dilation and will improve after AV surgery. She may be a candidate for TAVR.  Risks and benefits of cardiac catheterization have been discussed with the patient.  These include bleeding, infection, kidney damage, stroke, heart attack, death.  The patient understands these risks and is willing to proceed.  I will arrange Cardiac Catheterization with Dr. Irish Lack next week.  SARS-CoV-2 testing will be arranged.    Coronary artery disease involving native coronary artery of native heart without angina pectoris History of prior PCI in 5686 to the RCA complicated by dissection requiring stenting to the left main, LAD and LCx.  Cardiac catheterization 2018 demonstrated patent stents.  She is not really had any symptoms consistent with angina.  Continue Alirocumab, clopidogrel.  Essential hypertension The patient's blood pressure is controlled on her current regimen.  Continue current therapy.   Mixed hyperlipidemia Continue Alirocumab.  COPD GOLD III  Continue follow-up with pulmonology.   COVID-19 Education: The signs and symptoms of COVID-19 were discussed with the patient and how to seek care for testing (follow up with PCP or arrange E-visit).  The importance of social  distancing was discussed today.  Time:   Today, I have spent 18 minutes with the patient with telehealth technology discussing the above problems.     Medication Adjustments/Labs and Tests Ordered: Current medicines are reviewed at length with the patient today.  Concerns regarding medicines are outlined above.   Tests Ordered: Orders Placed This Encounter  Procedures   Basic metabolic panel   Pro b natriuretic peptide (BNP)    Medication Changes: No orders of the defined types were placed in this encounter.   Disposition:  Cardiac Catheterization will be arranged.   Signed, Richardson Dopp, PA-C  08/12/2018 4:59 PM    [facility]

## 2018-08-11 NOTE — Progress Notes (Signed)
Virtual Visit via Telephone Note   This visit type was conducted due to national recommendations for restrictions regarding the COVID-19 Pandemic (e.g. social distancing) in an effort to limit this patient's exposure and mitigate transmission in our community.  Due to her co-morbid illnesses, this patient is at least at moderate risk for complications without adequate follow up.  This format is felt to be most appropriate for this patient at this time.  The patient did not have access to video technology/had technical difficulties with video requiring transitioning to audio format only (telephone).  All issues noted in this document were discussed and addressed.  No physical exam could be performed with this format.  Please refer to the patient's chart for her  consent to telehealth for [facility]   Date:  08/12/2018   ID:  Teresa [name], DOB [birth year], MRN [id number]  Patient Location: Home Provider Location: Home  PCP:  Leeroy Cha, MD  Cardiologist:  Larae Grooms, MD   Electrophysiologist:  None   Evaluation Performed:  Follow-Up Visit  Chief Complaint:  Acute CHF  History of Present Illness:    Teresa [name] is a 68 y.o. female with coronary artery disease s/p stenting to the RCA in 4287 complicated by dissection requiring stenting of the LM, LAD and LCx, aortic stenosis, lung cancer s/p resection, combined systolic and diastolic CHF, COPD.  Her aortic stenosis has been overestimated on echo when compared to her last Cardiac Catheterization in 2018.  At that time, she had mild aortic stenosis on cath and all stents were patent.  She was last seen by Ermalinda Barrios, PA-C in 06/2018.   She was recently seen by Dr. Melvyn Novas for shortness of breath and a CXR showed vascular congestion and a pro BNP was > 4974.  She has been set up for an echocardiogram and follow up with me.  When she saw Dr. Melvyn Novas, her Lasix was adjusted.  She is now taking Lasix 40 mg 3 times a day.   She tells me that her breathing has improved somewhat.  Her prednisone was also increased.  She has slept on an incline chronically without significant change.  She was having issues with paroxysmal nocturnal dyspnea.  She is not had any further episodes since she increased her Lasix.  She still has shortness of breath with some activities.  She also reports bendopnea.  her weight has not changed.  Her lower extremity swelling has not changed.  She has had some indigestion-like chest pain at night that is relieved by antacids.  She has not had any exertional chest pain or syncope.  The patient does not have symptoms concerning for COVID-19 infection (fever, chills, cough, or new shortness of breath).    Past Medical History:  Diagnosis Date   AAA (abdominal aortic aneurysm) ([place])    a. small by cath 2014.   Arthritis    "across my hips; buttocks; comes w/the weather" (02/27/2013)   CAD (coronary artery disease), native coronary artery    a. 10/2012 - rotational atherectomy of LAD c/b dissection s/p PCI to LAD, LCx, LM. b. 12/2012 - s/p overlapping DES to RCA.   Cataract    "just the beginnings on the right" (02/27/2013)   Cellulitis 06/20/2012   "dog bite; right forearm" (06/24/2012)   Chronic bronchitis ([place])    "used to have it alot; haven't had it in a long time" (02/27/2013)   Chronic combined systolic and diastolic CHF (congestive heart failure) ([place])  COPD (chronic obstructive pulmonary disease) ([place])    "little bit" (02/27/2013)   GERD (gastroesophageal reflux disease)    Hypercholesterolemia    Ischemic cardiomyopathy    a. EF 45-50%.   Mass of lung    "small tumor RUL; they are watching it" (02/27/2013)   Myocardial infarction [facility])    "think dr said I've had 2 silent one" (02/27/2013)   Peripheral vascular disease, unspecified ([place])    Pneumonia 1990's   "once" (02/27/2013)   Seasonal allergies    Severe aortic stenosis    Tachycardia, unspecified     Tobacco abuse    Past Surgical History:  Procedure Laterality Date   CORONARY ANGIOPLASTY WITH STENT PLACEMENT  10/2012; 12/08/2012   "7 + 3" (02/27/2013)   ESOPHAGOGASTRODUODENOSCOPY (EGD) WITH PROPOFOL N/A 01/11/2015   Procedure: ESOPHAGOGASTRODUODENOSCOPY (EGD) WITH PROPOFOL;  Surgeon: Garlan Fair, MD;  Location: [facility];  Service: Endoscopy;  Laterality: N/A;   FLEXIBLE BRONCHOSCOPY N/A 03/02/2013   Procedure: FLEXIBLE BRONCHOSCOPY;  Surgeon: Gaye Pollack, MD;  Location: [place];  Service: Thoracic;  Laterality: N/A;   FLEXIBLE SIGMOIDOSCOPY N/A 01/11/2015   Procedure: FLEXIBLE SIGMOIDOSCOPY;  Surgeon: Garlan Fair, MD;  Location: [facility];  Service: Endoscopy;  Laterality: N/A;  unable to complete colon-prep issues   GANGLION CYST EXCISION Left 1975   "wrist"   LEFT HEART CATH AND CORONARY ANGIOGRAPHY N/A 06/29/2016   Procedure: Left Heart Cath and Coronary Angiography;  Surgeon: Jettie Booze, MD;  Location: [place] CV LAB;  Service: Cardiovascular;  Laterality: N/A;   LEFT HEART CATHETERIZATION WITH CORONARY ANGIOGRAM N/A 12/08/2012   Procedure: LEFT HEART CATHETERIZATION WITH CORONARY ANGIOGRAM;  Surgeon: Jettie Booze, MD;  Location: [facility] CATH LAB;  Service: Cardiovascular;  Laterality: N/A;   MOUTH SURGERY  2010?   "for bone loss" (06/24/2012)   PERCUTANEOUS CORONARY STENT INTERVENTION (PCI-S) N/A 10/09/2012   Procedure: PERCUTANEOUS CORONARY STENT INTERVENTION (PCI-S);  Surgeon: Jettie Booze, MD;  Location: [facility] CATH LAB;  Service: Cardiovascular;  Laterality: N/A;   THORACOTOMY/LOBECTOMY Right 03/02/2013   Procedure: Right Video Assisted Thoracoscopy/Thoracotomy with upper Lobectomy;  Surgeon: Gaye Pollack, MD;  Location: [facility] OR;  Service: Thoracic;  Laterality: Right;  Right Lung Upper  Lobectomy    TONSILLECTOMY  1960   VIDEO BRONCHOSCOPY  09/12/2011   Procedure: VIDEO BRONCHOSCOPY WITH FLUORO;  Surgeon: Tanda Rockers, MD;  Location:  [facility];  Service: Cardiopulmonary;  Laterality: Bilateral;     Current Meds  Medication Sig   albuterol (PROAIR HFA) 108 (90 Base) MCG/ACT inhaler Inhale 2 puffs into the lungs every 6 (six) hours as needed for wheezing or shortness of breath.   albuterol (PROVENTIL) (2.5 MG/3ML) 0.083% nebulizer solution Take 3 mLs (2.5 mg total) by nebulization every 4 (four) hours as needed for wheezing or shortness of breath.   Alirocumab (PRALUENT) 150 MG/ML SOAJ Inject 1 pen into the skin every 14 (fourteen) days.   Cholecalciferol 25 MCG (1000 UT) tablet Take 1,000 Units by mouth daily.   clopidogrel (PLAVIX) 75 MG tablet TAKE 1 TABLET(75 MG) BY MOUTH DAILY   diltiazem (CARDIZEM CD) 240 MG 24 hr capsule Take 1 capsule (240 mg total) by mouth daily.   fluticasone (FLONASE) 50 MCG/ACT nasal spray Place into both nostrils at bedtime.    Fluticasone-Umeclidin-Vilant (TRELEGY ELLIPTA) 100-62.5-25 MCG/INH AEPB Inhale 1 puff into the lungs daily.   furosemide (LASIX) 40 MG tablet Take 40 mg by mouth 3 (three) times daily.  Hypromellose (GENTEAL MILD) 0.2 % SOLN Place 2 drops into both eyes as needed.   ibuprofen (ADVIL) 200 MG tablet Take 200 mg by mouth every 6 (six) hours as needed.   lansoprazole (PREVACID) 30 MG capsule Take 1 capsule (30 mg total) by mouth daily at 12 noon.   NASAL SALINE NA Place 1 spray into the nose daily as needed (congestion).    nitroGLYCERIN (NITROSTAT) 0.4 MG SL tablet Place 1 tablet (0.4 mg total) under the tongue every 5 (five) minutes as needed for chest pain.   OVER THE COUNTER MEDICATION Take 1 tablet by mouth daily with lunch. [facility] Menopause support   OXYGEN 2lpm with sleep   potassium chloride SA (K-DUR) 20 MEQ tablet Take 1 tablet (20 mEq total) by mouth daily.   predniSONE (DELTASONE) 10 MG tablet 2 daily until better then 1 daily thereafter   pseudoephedrine-acetaminophen (TYLENOL SINUS) 30-500 MG TABS tablet Take 1 tablet by mouth every 4 (four)  hours as needed.   ranitidine (ZANTAC) 150 MG capsule Take 150 mg by mouth at bedtime as needed for heartburn.   simethicone (MYLICON) 80 MG chewable tablet Chew 160 mg by mouth every 6 (six) hours as needed for flatulence.   Spacer/Aero-Holding Chambers (AEROCHAMBER MV) inhaler Use as instructed     Allergies:   Azithromycin; Ceclor [cefaclor]; Lipitor [atorvastatin]; Morphine and related; Penicillins; Septra [sulfamethoxazole-trimethoprim]; Simvastatin; Vicodin [hydrocodone-acetaminophen]; Zetia [ezetimibe]; Doxycycline; and Adhesive [tape]   Social History   Tobacco Use   Smoking status: Former Smoker    Packs/day: 0.50    Years: 40.00    Pack years: 20.00    Types: Cigarettes    Last attempt to quit: 10/08/2012    Years since quitting: 5.8   Smokeless tobacco: Never Used  Substance Use Topics   Alcohol use: No   Drug use: No     Family Hx: The patient's family history includes Alcohol abuse in her father; Lupus in her mother; Migraines in her sister; Other in her sister; Pulmonary fibrosis in her brother. There is no history of Heart attack.  ROS:   Please see the history of present illness.    She has not had any bleeding issues.  She has an occasional cough.  She has not had wheezing. All other systems reviewed and are negative.   Prior CV studies:   The following studies were reviewed today:  Echo 08/12/2018  1. Dyskinesis of the left ventricular apex. Hypokinesis of the anterior wall and anterior septum.  2. The left ventricle has severely reduced systolic function, with an ejection fraction of 25-30%. The cavity size was moderately dilated. Left ventricular diastolic Doppler parameters are consistent with pseudonormalization. Elevated mean left atrial  pressure Left ventricular diffuse hypokinesis.  3. The right ventricle has mildly reduced systolic function. The cavity was normal. There is moderately increased right ventricular wall thickness. Right ventricular  systolic pressure is moderately elevated with an estimated pressure of 55.3 mmHg.  4. Left atrial size was mild-moderately dilated.  5. Right atrial size was mildly dilated.  6. Mild thickening of the mitral valve leaflet. There is moderate mitral annular calcification present. Mitral valve regurgitation is severe by color flow Doppler. The MR jet is eccentric laterally directed. No evidence of mitral valve stenosis.  7. The aortic valve is tricuspid. Severely thickening of the aortic valve. Severe calcifcation of the aortic valve. Aortic valve regurgitation is trivial by color flow Doppler.  8. Severe aortic stenosis: mean gradient 47 mm Hg, calculated AV  area 0.35 cm sq.  9. Tricuspid valve regurgitation is mild-moderate. 10. The aortic root and ascending aorta are normal in size and structure. 11. When compared to the prior study: Compared to September 2019, aortic stenosis has worsened, the left ventricle is now dilated and severely depressed and there is marked worsening of mitral regurgitation, which is now severe.   Echo 11/05/17 EF 40-45, ant-sept/ant/apical AK, Gr 2 DD, severe AS (mean 45, peak 47), MAC, mild to mod MR, mild LAE, trivial eff  Echo 07/04/16 EF 45-50, apical AK, Gr 1 DD, severe AS (mean 52, peak 80, MAC  Cardiac Catheterization 06/29/16 LM ost stent patent LAD ost stent patent LCx ost stent patent, prox 20 RCA prox 25, mid stent patent EF 45-50 AV mean 23.9 mmHg (mild AS)      Labs/Other Tests and Data Reviewed:    EKG:  No ECG reviewed.  CXR 08/04/2018 IMPRESSION: Progressive bilateral pulmonary opacities with a peripheral distribution. Findings may represent progressive interstitial lung disease, however an acute process is not excluded.  Vascular congestion.  Recent Labs: 10/27/2017: B Natriuretic Peptide 503.8 01/02/2018: ALT 10 08/04/2018: BUN 19; Creatinine, Ser 0.70; Hemoglobin 11.9; Platelets 363.0; Potassium 4.2; Pro B Natriuretic peptide (BNP)  >4974.0; Sodium 138; TSH 1.36   Recent Lipid Panel Lab Results  Component Value Date/Time   CHOL 152 08/18/2015 08:40 AM   TRIG 105 08/18/2015 08:40 AM   HDL 65 08/18/2015 08:40 AM   CHOLHDL 2.3 08/18/2015 08:40 AM   LDLCALC 66 08/18/2015 08:40 AM    Wt Readings from Last 3 Encounters:  08/12/18 103 lb (46.7 kg)  08/04/18 103 lb 6.4 oz (46.9 kg)  07/07/18 101 lb (45.8 kg)     Objective:    Vital Signs:  BP 110/65    Pulse 95    Ht 4' 11.5" (1.511 m)    Wt 103 lb (46.7 kg)    LMP 03/25/2011    SpO2 94%    BMI 20.46 kg/m    VITAL SIGNS:  reviewed GEN:  no acute distress EYES:  EOMI RESPIRATORY:  Normal respiratory effort NEURO:  alert and oriented x 3, no obvious focal deficit PSYCH:  normal affect  ASSESSMENT & PLAN:     Acute on chronic combined systolic and diastolic CHF (congestive heart failure) ([place]) She was seen in the office in April.  Her Lasix dose was reduced at that time.  She feels that her breathing started to worsen sometime after this.  EF by echo in 9/19 was 40-45 with grade 2 diastolic dysfunction.  Her aortic valve gradient has been overestimated by echocardiograms in the past.  She has mild aortic stenosis by cardiac catheterization in 2018 with mean gradient 24.  She had an echocardiogram arranged which was performed earlier today.  The results are still pending.  Overall, she is NYHA 2 b-3a.  Her volume status has improved slightly since her Lasix dose was increased.  I will arrange a follow-up BMET and BNP.  I will consider adjusting her Lasix further versus changing her to torsemide based upon these results.  I have reminded her to limit her salt and to limit her fluid intake to 2000 mL per day.  She can change her Lasix to 80 mg in the morning and 40 mg in the afternoon for now.  I will see her again in follow-up in 1 week.  Aortic valve stenosis, etiology of cardiac valve disease unspecified As noted, her gradient is overestimated by echocardiograms.  She  does have a 2D echocardiogram pending from today.  I will review this further with Dr. Irish Lack if possible.  If not, I will try to see if Dr. Burt Knack or Dr. Angelena Form can review it with me.  She may need cardiac catheterization to further evaluate as well as formal evaluation in the structural heart clinic.  ADDENDUM: I was called by Dr. Sallyanne Kuster after my visit with Ms. Hackel.  He read her echo.  Her echo today demonstrates an EF of 25-30, a dilated LV with severe MR and severe AS with AVA 0.35 and mean gradient 47.  LVOT/AV ratio is 0.11.  I also spoke with Dr. Irish Lack.  He recommends the patient undergo a R and L heart cath to further evaluate her aortic stenosis and LV dysfunction.  It may be that her MR is related to annular dilation and will improve after AV surgery. She may be a candidate for TAVR.  Risks and benefits of cardiac catheterization have been discussed with the patient.  These include bleeding, infection, kidney damage, stroke, heart attack, death.  The patient understands these risks and is willing to proceed.  I will arrange Cardiac Catheterization with Dr. Irish Lack next week.  SARS-CoV-2 testing will be arranged.    Coronary artery disease involving native coronary artery of native heart without angina pectoris History of prior PCI in 5686 to the RCA complicated by dissection requiring stenting to the left main, LAD and LCx.  Cardiac catheterization 2018 demonstrated patent stents.  She is not really had any symptoms consistent with angina.  Continue Alirocumab, clopidogrel.  Essential hypertension The patient's blood pressure is controlled on her current regimen.  Continue current therapy.   Mixed hyperlipidemia Continue Alirocumab.  COPD GOLD III  Continue follow-up with pulmonology.   COVID-19 Education: The signs and symptoms of COVID-19 were discussed with the patient and how to seek care for testing (follow up with PCP or arrange E-visit).  The importance of social  distancing was discussed today.  Time:   Today, I have spent 18 minutes with the patient with telehealth technology discussing the above problems.     Medication Adjustments/Labs and Tests Ordered: Current medicines are reviewed at length with the patient today.  Concerns regarding medicines are outlined above.   Tests Ordered: Orders Placed This Encounter  Procedures   Basic metabolic panel   Pro b natriuretic peptide (BNP)    Medication Changes: No orders of the defined types were placed in this encounter.   Disposition:  Cardiac Catheterization will be arranged.   Signed, Richardson Dopp, PA-C  08/12/2018 4:59 PM    [facility]

## 2018-08-12 ENCOUNTER — Telehealth: Payer: Self-pay | Admitting: *Deleted

## 2018-08-12 ENCOUNTER — Other Ambulatory Visit: Payer: Self-pay

## 2018-08-12 ENCOUNTER — Other Ambulatory Visit: Payer: PPO

## 2018-08-12 ENCOUNTER — Ambulatory Visit (HOSPITAL_COMMUNITY): Payer: PPO | Attending: Cardiovascular Disease

## 2018-08-12 ENCOUNTER — Telehealth (INDEPENDENT_AMBULATORY_CARE_PROVIDER_SITE_OTHER): Payer: PPO | Admitting: Physician Assistant

## 2018-08-12 ENCOUNTER — Encounter: Payer: Self-pay | Admitting: Physician Assistant

## 2018-08-12 VITALS — BP 110/65 | HR 95 | Ht 59.5 in | Wt 103.0 lb

## 2018-08-12 DIAGNOSIS — Z7189 Other specified counseling: Secondary | ICD-10-CM | POA: Diagnosis not present

## 2018-08-12 DIAGNOSIS — E782 Mixed hyperlipidemia: Secondary | ICD-10-CM

## 2018-08-12 DIAGNOSIS — I110 Hypertensive heart disease with heart failure: Secondary | ICD-10-CM

## 2018-08-12 DIAGNOSIS — I350 Nonrheumatic aortic (valve) stenosis: Secondary | ICD-10-CM | POA: Diagnosis not present

## 2018-08-12 DIAGNOSIS — J449 Chronic obstructive pulmonary disease, unspecified: Secondary | ICD-10-CM | POA: Diagnosis not present

## 2018-08-12 DIAGNOSIS — I5043 Acute on chronic combined systolic (congestive) and diastolic (congestive) heart failure: Secondary | ICD-10-CM | POA: Diagnosis not present

## 2018-08-12 DIAGNOSIS — I2510 Atherosclerotic heart disease of native coronary artery without angina pectoris: Secondary | ICD-10-CM

## 2018-08-12 DIAGNOSIS — R0600 Dyspnea, unspecified: Secondary | ICD-10-CM

## 2018-08-12 DIAGNOSIS — R0609 Other forms of dyspnea: Secondary | ICD-10-CM | POA: Diagnosis not present

## 2018-08-12 DIAGNOSIS — I10 Essential (primary) hypertension: Secondary | ICD-10-CM

## 2018-08-12 NOTE — Telephone Encounter (Signed)
Spoke with Hiusband who stated wife will be at [facility] at 2 pm to be ready for lab at 215        COVID-19 Pre-Screening Questions:  . In the past 7 to 10 days have you had a cough,  shortness of breath, headache, congestion, fever (100 or greater) body aches, chills, sore throat, or sudden loss of taste or sense of smell? NO  . Have you been around anyone with known Covid 19. NO  . Have you been around anyone who is awaiting Covid 19 test results in the past 7 to 10 days? NO  . Have you been around anyone who has been exposed to Covid 19, or has mentioned symptoms of Covid 19 within the past 7 to 10 days? NO   If you have any concerns/questions about symptoms patients report during screening (either on the phone or at threshold). Contact the provider seeing the patient or DOD for further guidance.  If neither are available contact a member of the leadership team.                  .

## 2018-08-12 NOTE — Patient Instructions (Addendum)
Medication Instructions:   Lasix 40 mg to take 2 (80 mg) in the morning and 40 mg in the afternoon   If you need a refill on your cardiac medications before your next appointment, please call your pharmacy.   Lab work: BNP BMET today   If you have labs (blood work) drawn today and your tests are completely normal, you will receive your results only by: [name] MyChart Message (if you have MyChart) OR . A paper copy in the mail If you have any lab test that is abnormal or we need to change your treatment, we will call you to review the results.  Testing/Procedures: NONE ORDERED  TODAY   Follow-Up:  In one week virtual visit  with Richardson Dopp PA    Any Other Special Instructions Will Be Listed Below (If Applicable).  limit  salt as well as limit fluids to less than 50-60 ounces per day.

## 2018-08-13 ENCOUNTER — Encounter: Payer: Self-pay | Admitting: *Deleted

## 2018-08-13 ENCOUNTER — Telehealth: Payer: Self-pay | Admitting: *Deleted

## 2018-08-13 DIAGNOSIS — I5043 Acute on chronic combined systolic (congestive) and diastolic (congestive) heart failure: Secondary | ICD-10-CM

## 2018-08-13 LAB — BASIC METABOLIC PANEL
BUN/Creatinine Ratio: 24 (ref 12–28)
BUN: 19 mg/dL (ref 8–27)
CO2: 32 mmol/L — ABNORMAL HIGH (ref 20–29)
Calcium: 9.1 mg/dL (ref 8.7–10.3)
Chloride: 90 mmol/L — ABNORMAL LOW (ref 96–106)
Creatinine, Ser: 0.8 mg/dL (ref 0.57–1.00)
GFR calc Af Amer: 89 mL/min/{1.73_m2} (ref >59)
GFR calc non Af Amer: 78 mL/min/{1.73_m2} (ref >59)
Glucose: 174 mg/dL — ABNORMAL HIGH (ref 65–99)
Potassium: 4.1 mmol/L (ref 3.5–5.2)
Sodium: 138 mmol/L (ref 134–144)

## 2018-08-13 LAB — PRO B NATRIURETIC PEPTIDE: NT-Pro BNP: 33156 pg/mL — ABNORMAL HIGH (ref 0–301)

## 2018-08-13 MED ORDER — POTASSIUM CHLORIDE CRYS ER 20 MEQ PO TBCR: 20.0000 meq | EXTENDED_RELEASE_TABLET | Freq: Two times a day (BID) | ORAL | 3 refills | Status: DC

## 2018-08-13 NOTE — Telephone Encounter (Signed)
Patient aware of results and recommendations. °

## 2018-08-14 ENCOUNTER — Other Ambulatory Visit (HOSPITAL_COMMUNITY)
Admission: RE | Admit: 2018-08-14 | Discharge: 2018-08-14 | Disposition: A | Discharge status: Home / Self Care | Payer: PPO | Source: Ambulatory Visit | Attending: Interventional Cardiology | Admitting: Interventional Cardiology

## 2018-08-14 DIAGNOSIS — I350 Nonrheumatic aortic (valve) stenosis: Secondary | ICD-10-CM

## 2018-08-14 DIAGNOSIS — Z1159 Encounter for screening for other viral diseases: Secondary | ICD-10-CM | POA: Diagnosis not present

## 2018-08-14 DIAGNOSIS — I5043 Acute on chronic combined systolic (congestive) and diastolic (congestive) heart failure: Secondary | ICD-10-CM

## 2018-08-14 DIAGNOSIS — I2510 Atherosclerotic heart disease of native coronary artery without angina pectoris: Secondary | ICD-10-CM

## 2018-08-15 LAB — NOVEL CORONAVIRUS, NAA (HOSP ORDER, SEND-OUT TO REF LAB; TAT 18-24 HRS): SARS-CoV-2, NAA: NOT DETECTED

## 2018-08-16 DIAGNOSIS — R05 Cough: Secondary | ICD-10-CM | POA: Diagnosis not present

## 2018-08-16 DIAGNOSIS — J9611 Chronic respiratory failure with hypoxia: Secondary | ICD-10-CM | POA: Diagnosis not present

## 2018-08-18 ENCOUNTER — Other Ambulatory Visit: Payer: Self-pay

## 2018-08-18 ENCOUNTER — Other Ambulatory Visit: Payer: PPO | Admitting: *Deleted

## 2018-08-18 ENCOUNTER — Telehealth: Payer: Self-pay | Admitting: *Deleted

## 2018-08-18 DIAGNOSIS — I5043 Acute on chronic combined systolic (congestive) and diastolic (congestive) heart failure: Secondary | ICD-10-CM

## 2018-08-18 LAB — BASIC METABOLIC PANEL
BUN/Creatinine Ratio: 28 (ref 12–28)
BUN: 24 mg/dL (ref 8–27)
CO2: 30 mmol/L — ABNORMAL HIGH (ref 20–29)
Calcium: 9.5 mg/dL (ref 8.7–10.3)
Chloride: 91 mmol/L — ABNORMAL LOW (ref 96–106)
Creatinine, Ser: 0.85 mg/dL (ref 0.57–1.00)
GFR calc Af Amer: 83 mL/min/{1.73_m2} (ref >59)
GFR calc non Af Amer: 72 mL/min/{1.73_m2} (ref >59)
Glucose: 133 mg/dL — ABNORMAL HIGH (ref 65–99)
Potassium: 3.6 mmol/L (ref 3.5–5.2)
Sodium: 139 mmol/L (ref 134–144)

## 2018-08-18 NOTE — Telephone Encounter (Signed)
Pt contacted pre-catheterization scheduled at [facility] for: Tuesday August 19, 2018 7:30 AM Verified arrival time and place: [place] Entrance A at: 5:30 AM  Covid-19 test date: 08/14/18  No solid food after midnight prior to cath, clear liquids until 5 AM day of procedure. Contrast allergy:no  Hold: Furosemide-AM of procedure KCl-AM of procedure  Except hold medications AM meds can be  taken pre-cath with sip of water including: ASA 81 mg Plavix 75 mg   Confirmed patient has responsible person to drive home post procedure and observe 24 hours after arriving home: yes  Due to Covid-19 pandemic no visitors are allowed in the hospital (unless cognitive impairment).  Their designated party will be called when their procedure is over for an update and to arrange pick up.  Patients are required to wear a mask when they enter the hospital.      COVID-19 Pre-Screening Questions:  . In the past 7 to 10 days have you had a cough,  shortness of breath, headache, congestion, fever (100 or greater) body aches, chills, sore throat, or sudden loss of taste or sense of smell? Cough-feels from other conditions not Covid-19 related symptoms . Have you been around anyone with known Covid 19? no . Have you been around anyone who is awaiting Covid 19 test results in the past 7 to 10 days? no . Have you been around anyone who has been exposed to Covid 19, or has mentioned symptoms of Covid 19 within the past 7 to 10 days? no  I reviewed procedure instructions/mask/visitor/Covid-19 screening questions with patient, she verbalized understanding,

## 2018-08-19 ENCOUNTER — Other Ambulatory Visit: Payer: Self-pay

## 2018-08-19 ENCOUNTER — Inpatient Hospital Stay (HOSPITAL_COMMUNITY)
Admission: AD | Admit: 2018-08-19 | Discharge: 2018-08-28 | DRG: 266 | Disposition: A | Discharge status: Home / Self Care | Payer: PPO | Attending: Thoracic Surgery (Cardiothoracic Vascular Surgery) | Admitting: Thoracic Surgery (Cardiothoracic Vascular Surgery)

## 2018-08-19 ENCOUNTER — Encounter (HOSPITAL_COMMUNITY): Payer: Self-pay | Admitting: Physician Assistant

## 2018-08-19 ENCOUNTER — Encounter (HOSPITAL_COMMUNITY)
Admission: AD | Disposition: A | Discharge status: Home / Self Care | Payer: Self-pay | Source: Home / Self Care | Attending: Cardiovascular Disease

## 2018-08-19 DIAGNOSIS — E669 Obesity, unspecified: Secondary | ICD-10-CM | POA: Diagnosis present

## 2018-08-19 DIAGNOSIS — I708 Atherosclerosis of other arteries: Secondary | ICD-10-CM | POA: Diagnosis not present

## 2018-08-19 DIAGNOSIS — Z006 Encounter for examination for normal comparison and control in clinical research program: Secondary | ICD-10-CM

## 2018-08-19 DIAGNOSIS — I252 Old myocardial infarction: Secondary | ICD-10-CM

## 2018-08-19 DIAGNOSIS — J449 Chronic obstructive pulmonary disease, unspecified: Secondary | ICD-10-CM | POA: Diagnosis present

## 2018-08-19 DIAGNOSIS — M47814 Spondylosis without myelopathy or radiculopathy, thoracic region: Secondary | ICD-10-CM | POA: Diagnosis not present

## 2018-08-19 DIAGNOSIS — C3410 Malignant neoplasm of upper lobe, unspecified bronchus or lung: Secondary | ICD-10-CM | POA: Diagnosis present

## 2018-08-19 DIAGNOSIS — Z811 Family history of alcohol abuse and dependence: Secondary | ICD-10-CM

## 2018-08-19 DIAGNOSIS — I083 Combined rheumatic disorders of mitral, aortic and tricuspid valves: Secondary | ICD-10-CM | POA: Diagnosis present

## 2018-08-19 DIAGNOSIS — I5043 Acute on chronic combined systolic (congestive) and diastolic (congestive) heart failure: Secondary | ICD-10-CM | POA: Diagnosis not present

## 2018-08-19 DIAGNOSIS — I25119 Atherosclerotic heart disease of native coronary artery with unspecified angina pectoris: Secondary | ICD-10-CM | POA: Diagnosis not present

## 2018-08-19 DIAGNOSIS — K219 Gastro-esophageal reflux disease without esophagitis: Secondary | ICD-10-CM | POA: Diagnosis present

## 2018-08-19 DIAGNOSIS — I110 Hypertensive heart disease with heart failure: Secondary | ICD-10-CM | POA: Diagnosis present

## 2018-08-19 DIAGNOSIS — Z1159 Encounter for screening for other viral diseases: Secondary | ICD-10-CM

## 2018-08-19 DIAGNOSIS — J302 Other seasonal allergic rhinitis: Secondary | ICD-10-CM | POA: Diagnosis not present

## 2018-08-19 DIAGNOSIS — Z791 Long term (current) use of non-steroidal anti-inflammatories (NSAID): Secondary | ICD-10-CM

## 2018-08-19 DIAGNOSIS — Z952 Presence of prosthetic heart valve: Secondary | ICD-10-CM

## 2018-08-19 DIAGNOSIS — Z880 Allergy status to penicillin: Secondary | ICD-10-CM

## 2018-08-19 DIAGNOSIS — I272 Pulmonary hypertension, unspecified: Secondary | ICD-10-CM | POA: Diagnosis present

## 2018-08-19 DIAGNOSIS — Z7902 Long term (current) use of antithrombotics/antiplatelets: Secondary | ICD-10-CM | POA: Diagnosis not present

## 2018-08-19 DIAGNOSIS — Z79899 Other long term (current) drug therapy: Secondary | ICD-10-CM

## 2018-08-19 DIAGNOSIS — J9611 Chronic respiratory failure with hypoxia: Secondary | ICD-10-CM | POA: Diagnosis not present

## 2018-08-19 DIAGNOSIS — E782 Mixed hyperlipidemia: Secondary | ICD-10-CM | POA: Diagnosis present

## 2018-08-19 DIAGNOSIS — Z7951 Long term (current) use of inhaled steroids: Secondary | ICD-10-CM

## 2018-08-19 DIAGNOSIS — K30 Functional dyspepsia: Secondary | ICD-10-CM | POA: Diagnosis not present

## 2018-08-19 DIAGNOSIS — I2510 Atherosclerotic heart disease of native coronary artery without angina pectoris: Secondary | ICD-10-CM | POA: Diagnosis not present

## 2018-08-19 DIAGNOSIS — Z01810 Encounter for preprocedural cardiovascular examination: Secondary | ICD-10-CM | POA: Diagnosis not present

## 2018-08-19 DIAGNOSIS — I255 Ischemic cardiomyopathy: Secondary | ICD-10-CM | POA: Diagnosis not present

## 2018-08-19 DIAGNOSIS — I25118 Atherosclerotic heart disease of native coronary artery with other forms of angina pectoris: Secondary | ICD-10-CM | POA: Diagnosis not present

## 2018-08-19 DIAGNOSIS — I739 Peripheral vascular disease, unspecified: Secondary | ICD-10-CM | POA: Diagnosis not present

## 2018-08-19 DIAGNOSIS — K5730 Diverticulosis of large intestine without perforation or abscess without bleeding: Secondary | ICD-10-CM | POA: Diagnosis not present

## 2018-08-19 DIAGNOSIS — Z91048 Other nonmedicinal substance allergy status: Secondary | ICD-10-CM

## 2018-08-19 DIAGNOSIS — Z832 Family history of diseases of the blood and blood-forming organs and certain disorders involving the immune mechanism: Secondary | ICD-10-CM

## 2018-08-19 DIAGNOSIS — Z881 Allergy status to other antibiotic agents status: Secondary | ICD-10-CM

## 2018-08-19 DIAGNOSIS — Z7952 Long term (current) use of systemic steroids: Secondary | ICD-10-CM

## 2018-08-19 DIAGNOSIS — I340 Nonrheumatic mitral (valve) insufficiency: Secondary | ICD-10-CM | POA: Diagnosis not present

## 2018-08-19 DIAGNOSIS — I2582 Chronic total occlusion of coronary artery: Secondary | ICD-10-CM | POA: Diagnosis not present

## 2018-08-19 DIAGNOSIS — I5023 Acute on chronic systolic (congestive) heart failure: Secondary | ICD-10-CM | POA: Diagnosis not present

## 2018-08-19 DIAGNOSIS — I342 Nonrheumatic mitral (valve) stenosis: Secondary | ICD-10-CM | POA: Diagnosis not present

## 2018-08-19 DIAGNOSIS — I351 Nonrheumatic aortic (valve) insufficiency: Secondary | ICD-10-CM | POA: Diagnosis not present

## 2018-08-19 DIAGNOSIS — J438 Other emphysema: Secondary | ICD-10-CM | POA: Diagnosis not present

## 2018-08-19 DIAGNOSIS — Z85118 Personal history of other malignant neoplasm of bronchus and lung: Secondary | ICD-10-CM

## 2018-08-19 DIAGNOSIS — J432 Centrilobular emphysema: Secondary | ICD-10-CM | POA: Diagnosis not present

## 2018-08-19 DIAGNOSIS — Z888 Allergy status to other drugs, medicaments and biological substances status: Secondary | ICD-10-CM

## 2018-08-19 DIAGNOSIS — Z87891 Personal history of nicotine dependence: Secondary | ICD-10-CM

## 2018-08-19 DIAGNOSIS — Z48812 Encounter for surgical aftercare following surgery on the circulatory system: Secondary | ICD-10-CM | POA: Diagnosis not present

## 2018-08-19 DIAGNOSIS — I350 Nonrheumatic aortic (valve) stenosis: Secondary | ICD-10-CM | POA: Diagnosis not present

## 2018-08-19 DIAGNOSIS — F17200 Nicotine dependence, unspecified, uncomplicated: Secondary | ICD-10-CM | POA: Diagnosis present

## 2018-08-19 DIAGNOSIS — I5032 Chronic diastolic (congestive) heart failure: Secondary | ICD-10-CM | POA: Diagnosis present

## 2018-08-19 DIAGNOSIS — Z955 Presence of coronary angioplasty implant and graft: Secondary | ICD-10-CM

## 2018-08-19 DIAGNOSIS — Z882 Allergy status to sulfonamides status: Secondary | ICD-10-CM

## 2018-08-19 DIAGNOSIS — Z9981 Dependence on supplemental oxygen: Secondary | ICD-10-CM

## 2018-08-19 DIAGNOSIS — Z885 Allergy status to narcotic agent status: Secondary | ICD-10-CM

## 2018-08-19 DIAGNOSIS — I700 Atherosclerosis of aorta: Secondary | ICD-10-CM | POA: Diagnosis present

## 2018-08-19 HISTORY — PX: RIGHT HEART CATH: CATH118263

## 2018-08-19 HISTORY — PX: LEFT HEART CATH AND CORONARY ANGIOGRAPHY: CATH118249

## 2018-08-19 HISTORY — DX: Nonrheumatic mitral (valve) insufficiency: I34.0

## 2018-08-19 HISTORY — DX: Acute on chronic combined systolic (congestive) and diastolic (congestive) heart failure: I50.43

## 2018-08-19 HISTORY — DX: Presence of prosthetic heart valve: Z95.2

## 2018-08-19 LAB — POCT I-STAT 7, (LYTES, BLD GAS, ICA,H+H)
Acid-Base Excess: 8 mmol/L — ABNORMAL HIGH (ref 0.0–2.0)
Bicarbonate: 33.3 mmol/L — ABNORMAL HIGH (ref 20.0–28.0)
Calcium, Ion: 1.15 mmol/L (ref 1.15–1.40)
HCT: 37 % (ref 36.0–46.0)
Hemoglobin: 12.6 g/dL (ref 12.0–15.0)
O2 Saturation: 99 %
Potassium: 3.6 mmol/L (ref 3.5–5.1)
Sodium: 135 mmol/L (ref 135–145)
TCO2: 35 mmol/L — ABNORMAL HIGH (ref 22–32)
pCO2 arterial: 46.1 mmHg (ref 32.0–48.0)
pH, Arterial: 7.467 — ABNORMAL HIGH (ref 7.350–7.450)
pO2, Arterial: 129 mmHg — ABNORMAL HIGH (ref 83.0–108.0)

## 2018-08-19 LAB — POCT I-STAT EG7
Acid-Base Excess: 5 mmol/L — ABNORMAL HIGH (ref 0.0–2.0)
Bicarbonate: 30.5 mmol/L — ABNORMAL HIGH (ref 20.0–28.0)
Calcium, Ion: 0.9 mmol/L — ABNORMAL LOW (ref 1.15–1.40)
HCT: 32 % — ABNORMAL LOW (ref 36.0–46.0)
Hemoglobin: 10.9 g/dL — ABNORMAL LOW (ref 12.0–15.0)
O2 Saturation: 63 %
Potassium: 3 mmol/L — ABNORMAL LOW (ref 3.5–5.1)
Sodium: 142 mmol/L (ref 135–145)
TCO2: 32 mmol/L (ref 22–32)
pCO2, Ven: 47.1 mmHg (ref 44.0–60.0)
pH, Ven: 7.419 (ref 7.250–7.430)
pO2, Ven: 33 mmHg (ref 32.0–45.0)

## 2018-08-19 SURGERY — LEFT HEART CATH AND CORONARY ANGIOGRAPHY
Anesthesia: LOCAL

## 2018-08-19 MED ORDER — DILTIAZEM HCL ER COATED BEADS 240 MG PO CP24
240.0000 mg | ORAL_CAPSULE | Freq: Every day | ORAL | Status: DC
  Administered 2018-08-20: 240 mg via ORAL
  Filled 2018-08-19: qty 1

## 2018-08-19 MED ORDER — SODIUM CHLORIDE 0.9 % IV SOLN
250.0000 mL | INTRAVENOUS | Status: DC | PRN
  Administered 2018-08-22: 09:00:00 via INTRAVENOUS

## 2018-08-19 MED ORDER — SODIUM CHLORIDE 0.9 % IV SOLN
INTRAVENOUS | Status: AC
  Administered 2018-08-19: 11:00:00 via INTRAVENOUS

## 2018-08-19 MED ORDER — LIDOCAINE HCL (PF) 1 % IJ SOLN
INTRAMUSCULAR | Status: AC
  Filled 2018-08-19: qty 30

## 2018-08-19 MED ORDER — IOHEXOL 350 MG/ML SOLN
INTRAVENOUS | Status: DC | PRN
  Administered 2018-08-19: 09:00:00 60 mL via INTRA_ARTERIAL

## 2018-08-19 MED ORDER — HEPARIN SODIUM (PORCINE) 1000 UNIT/ML IJ SOLN
INTRAMUSCULAR | Status: DC | PRN
  Administered 2018-08-19: 3500 [IU] via INTRAVENOUS

## 2018-08-19 MED ORDER — FLUTICASONE-UMECLIDIN-VILANT 100-62.5-25 MCG/INH IN AEPB: 1.0000 | INHALATION_SPRAY | Freq: Every day | RESPIRATORY_TRACT | Status: DC

## 2018-08-19 MED ORDER — FENTANYL CITRATE (PF) 100 MCG/2ML IJ SOLN
INTRAMUSCULAR | Status: DC | PRN
  Administered 2018-08-19: 25 ug via INTRAVENOUS

## 2018-08-19 MED ORDER — HEPARIN (PORCINE) IN NACL 1000-0.9 UT/500ML-% IV SOLN
INTRAVENOUS | Status: DC | PRN
  Administered 2018-08-19 (×2): 500 mL

## 2018-08-19 MED ORDER — SODIUM CHLORIDE 0.9 % WEIGHT BASED INFUSION: 1.0000 mL/kg/h | INTRAVENOUS | Status: DC

## 2018-08-19 MED ORDER — LABETALOL HCL 5 MG/ML IV SOLN: 10.0000 mg | INTRAVENOUS | Status: AC | PRN

## 2018-08-19 MED ORDER — FLUTICASONE FUROATE-VILANTEROL 100-25 MCG/INH IN AEPB
1.0000 | INHALATION_SPRAY | Freq: Every day | RESPIRATORY_TRACT | Status: DC
  Administered 2018-08-20 – 2018-08-25 (×6): 1 via RESPIRATORY_TRACT
  Filled 2018-08-19: qty 28

## 2018-08-19 MED ORDER — ACETAMINOPHEN 325 MG PO TABS
650.0000 mg | ORAL_TABLET | ORAL | Status: DC | PRN
  Administered 2018-08-24: 650 mg via ORAL
  Filled 2018-08-19 (×2): qty 2

## 2018-08-19 MED ORDER — LIDOCAINE HCL (PF) 1 % IJ SOLN
INTRAMUSCULAR | Status: DC | PRN
  Administered 2018-08-19: 2 mL via INTRADERMAL
  Administered 2018-08-19: 5 mL via INTRADERMAL

## 2018-08-19 MED ORDER — NITROGLYCERIN 0.4 MG SL SUBL: 0.4000 mg | SUBLINGUAL_TABLET | SUBLINGUAL | Status: DC | PRN

## 2018-08-19 MED ORDER — FENTANYL CITRATE (PF) 100 MCG/2ML IJ SOLN
INTRAMUSCULAR | Status: AC
  Filled 2018-08-19: qty 2

## 2018-08-19 MED ORDER — SODIUM CHLORIDE 0.9% FLUSH: 3.0000 mL | INTRAVENOUS | Status: DC | PRN

## 2018-08-19 MED ORDER — SODIUM CHLORIDE 0.9 % WEIGHT BASED INFUSION
3.0000 mL/kg/h | INTRAVENOUS | Status: DC
  Administered 2018-08-19: 3 mL/kg/h via INTRAVENOUS

## 2018-08-19 MED ORDER — VERAPAMIL HCL 2.5 MG/ML IV SOLN
INTRAVENOUS | Status: DC | PRN
  Administered 2018-08-19: 10 mL via INTRA_ARTERIAL

## 2018-08-19 MED ORDER — ALBUTEROL SULFATE HFA 108 (90 BASE) MCG/ACT IN AERS: 2.0000 | INHALATION_SPRAY | Freq: Four times a day (QID) | RESPIRATORY_TRACT | Status: DC | PRN

## 2018-08-19 MED ORDER — POTASSIUM CHLORIDE CRYS ER 20 MEQ PO TBCR
20.0000 meq | EXTENDED_RELEASE_TABLET | Freq: Two times a day (BID) | ORAL | Status: DC
  Administered 2018-08-19 – 2018-08-21 (×5): 20 meq via ORAL
  Filled 2018-08-19 (×5): qty 1

## 2018-08-19 MED ORDER — CLOPIDOGREL BISULFATE 75 MG PO TABS
75.0000 mg | ORAL_TABLET | Freq: Every day | ORAL | Status: DC
  Administered 2018-08-20 – 2018-08-25 (×6): 75 mg via ORAL
  Filled 2018-08-19 (×6): qty 1

## 2018-08-19 MED ORDER — VERAPAMIL HCL 2.5 MG/ML IV SOLN
INTRAVENOUS | Status: AC
  Filled 2018-08-19: qty 2

## 2018-08-19 MED ORDER — MIDAZOLAM HCL 2 MG/2ML IJ SOLN
INTRAMUSCULAR | Status: DC | PRN
  Administered 2018-08-19: 1 mg via INTRAVENOUS

## 2018-08-19 MED ORDER — MIDAZOLAM HCL 2 MG/2ML IJ SOLN
INTRAMUSCULAR | Status: AC
  Filled 2018-08-19: qty 2

## 2018-08-19 MED ORDER — FUROSEMIDE 10 MG/ML IJ SOLN
80.0000 mg | Freq: Two times a day (BID) | INTRAMUSCULAR | Status: DC
  Administered 2018-08-19 – 2018-08-24 (×11): 80 mg via INTRAVENOUS
  Filled 2018-08-19 (×11): qty 8

## 2018-08-19 MED ORDER — UMECLIDINIUM BROMIDE 62.5 MCG/INH IN AEPB
1.0000 | INHALATION_SPRAY | Freq: Every day | RESPIRATORY_TRACT | Status: DC
  Administered 2018-08-20 – 2018-08-25 (×6): 1 via RESPIRATORY_TRACT
  Filled 2018-08-19: qty 7

## 2018-08-19 MED ORDER — ASPIRIN 81 MG PO CHEW: 81.0000 mg | CHEWABLE_TABLET | ORAL | Status: DC

## 2018-08-19 MED ORDER — ALBUTEROL SULFATE (2.5 MG/3ML) 0.083% IN NEBU: 2.5000 mg | INHALATION_SOLUTION | RESPIRATORY_TRACT | Status: DC | PRN

## 2018-08-19 MED ORDER — HYDRALAZINE HCL 20 MG/ML IJ SOLN: 10.0000 mg | INTRAMUSCULAR | Status: AC | PRN

## 2018-08-19 MED ORDER — ONDANSETRON HCL 4 MG/2ML IJ SOLN: 4.0000 mg | Freq: Four times a day (QID) | INTRAMUSCULAR | Status: DC | PRN

## 2018-08-19 MED ORDER — SODIUM CHLORIDE 0.9% FLUSH
3.0000 mL | Freq: Two times a day (BID) | INTRAVENOUS | Status: DC
  Administered 2018-08-19 – 2018-08-22 (×6): 3 mL via INTRAVENOUS

## 2018-08-19 MED ORDER — HEPARIN (PORCINE) IN NACL 1000-0.9 UT/500ML-% IV SOLN
INTRAVENOUS | Status: AC
  Filled 2018-08-19: qty 1000

## 2018-08-19 SURGICAL SUPPLY — 16 items
BAG SNAP BAND KOVER 36X36 (MISCELLANEOUS) ×1 IMPLANT
CATH 5FR JL3.5 JR4 ANG PIG MP (CATHETERS) ×1 IMPLANT
CATH BALLN WEDGE 5F 110CM (CATHETERS) ×1 IMPLANT
CATH INFINITI 5FR AL1 (CATHETERS) ×1 IMPLANT
COVER DOME SNAP 22 D (MISCELLANEOUS) ×1 IMPLANT
DEVICE RAD TR BAND REGULAR (VASCULAR PRODUCTS) ×1 IMPLANT
GLIDESHEATH SLEND SS 6F .021 (SHEATH) ×1 IMPLANT
GUIDEWIRE .025 260CM (WIRE) ×1 IMPLANT
GUIDEWIRE INQWIRE 1.5J.035X260 (WIRE) IMPLANT
INQWIRE 1.5J .035X260CM (WIRE) ×2
KIT HEART LEFT (KITS) ×2 IMPLANT
PACK CARDIAC CATHETERIZATION (CUSTOM PROCEDURE TRAY) ×2 IMPLANT
SHEATH GLIDE SLENDER 4/5FR (SHEATH) ×1 IMPLANT
TRANSDUCER W/STOPCOCK (MISCELLANEOUS) ×2 IMPLANT
TUBING CIL FLEX 10 FLL-RA (TUBING) ×2 IMPLANT
WIRE EMERALD ST .035X150CM (WIRE) ×1 IMPLANT

## 2018-08-19 NOTE — Interval H&P Note (Signed)
History and Physical Interval Note:  08/19/2018 7:40 AM  Teresa [name]  has presented today for cardiac cath with the diagnosis of heart failure - stenosis.  The various methods of treatment have been discussed with the patient and family. After consideration of risks, benefits and other options for treatment, the patient has consented to  Procedure(s): LEFT HEART CATH AND CORONARY ANGIOGRAPHY (N/A) as a surgical intervention.  The patient's history has been reviewed, patient examined, no change in status, stable for surgery.  I have reviewed the patient's chart and labs.  Questions were answered to the patient's satisfaction.    Cath Lab Visit (complete for each Cath Lab visit)  Clinical Evaluation Leading to the Procedure:   ACS: No.  Non-ACS:    Anginal Classification: class 2  Anti-ischemic medical therapy: No Therapy  Non-Invasive Test Results: No non-invasive testing performed  Prior CABG: No previous CABG         Lauree Chandler

## 2018-08-19 NOTE — Progress Notes (Signed)
Pt received on the floor at 9am. AOx4.  TR band with 10cc of air started at 8:35am  RN removed 2cc of air on the TR band at (9:17am) but it started to bleed. RN put 2cc of air back. Advised the pt to elevate and limit movement with R arm. Pt verbalized understanding. Will continue to monitor.

## 2018-08-19 NOTE — H&P (Signed)
Cardiology Admission History and Physical:   Patient ID: Teresa [name] MRN: [id number]; DOB: 1950/03/27   Admission date: 08/19/2018  Primary Care Provider: Leeroy Cha, MD Primary Cardiologist: Larae Grooms, MD  Primary Electrophysiologist:  None   Chief Complaint:  Dyspnea   History of Present Illness:   Teresa [name] is a 68 y.o. female with history of CAD (prior stenting of the LM, LAD and circumflex in the setting of dissection), prior RCA stenting, AAA, chronic bronchitis, chronic systolic and diastolic CHF, GERD, HLD, PAD, aortic stenosis and mitral regurgitation, ischemic cardiomyopathy and tobacco abuse who is being admitted today post cath for volume overload and diuresis. She is followed by Dr. Irish Lack. Recent worsened dyspnea. Known to have moderate aortic stenosis. Recent echo with worsened LV systolic dysfunction with LVEF of 25%. Aortic stenosis is now severe. Mitral regurgitation is severe. Filling pressures elevated on cardiac cath today. Pulmonary capillary wedge pressure 33 mmHg.   Pt denies chest pain. Has no energy and severe dyspnea with exertion.    Past Medical History:  Diagnosis Date  . AAA (abdominal aortic aneurysm) ([place])    a. small by cath 2014.  . Arthritis    "across my hips; buttocks; comes w/the weather" (02/27/2013)  . CAD (coronary artery disease), native coronary artery    a. 10/2012 - rotational atherectomy of LAD c/b dissection s/p PCI to LAD, LCx, LM. b. 12/2012 - s/p overlapping DES to RCA.  . Cataract    "just the beginnings on the right" (02/27/2013)  . Cellulitis 06/20/2012   "dog bite; right forearm" (06/24/2012)  . Chronic bronchitis ([place])    "used to have it alot; haven't had it in a long time" (02/27/2013)  . Chronic combined systolic and diastolic CHF (congestive heart failure) ([place])   . COPD (chronic obstructive pulmonary disease) ([place])    "little bit" (02/27/2013)  . GERD (gastroesophageal reflux disease)    . Hypercholesterolemia   . Ischemic cardiomyopathy    a. EF 45-50%.  . Mass of lung    "small tumor RUL; they are watching it" (02/27/2013)  . Myocardial infarction [facility])    "think dr said I've had 2 silent one" (02/27/2013)  . Peripheral vascular disease, unspecified ([place])   . Pneumonia 1990's   "once" (02/27/2013)  . Seasonal allergies   . Severe aortic stenosis   . Tachycardia, unspecified   . Tobacco abuse     Past Surgical History:  Procedure Laterality Date  . CORONARY ANGIOPLASTY WITH STENT PLACEMENT  10/2012; 12/08/2012   "7 + 3" (02/27/2013)  . ESOPHAGOGASTRODUODENOSCOPY (EGD) WITH PROPOFOL N/A 01/11/2015   Procedure: ESOPHAGOGASTRODUODENOSCOPY (EGD) WITH PROPOFOL;  Surgeon: Garlan Fair, MD;  Location: [facility];  Service: Endoscopy;  Laterality: N/A;  . FLEXIBLE BRONCHOSCOPY N/A 03/02/2013   Procedure: FLEXIBLE BRONCHOSCOPY;  Surgeon: Gaye Pollack, MD;  Location: [place];  Service: Thoracic;  Laterality: N/A;  . FLEXIBLE SIGMOIDOSCOPY N/A 01/11/2015   Procedure: FLEXIBLE SIGMOIDOSCOPY;  Surgeon: Garlan Fair, MD;  Location: [facility];  Service: Endoscopy;  Laterality: N/A;  unable to complete colon-prep issues  . GANGLION CYST EXCISION Left 1975   "wrist"  . LEFT HEART CATH AND CORONARY ANGIOGRAPHY N/A 06/29/2016   Procedure: Left Heart Cath and Coronary Angiography;  Surgeon: Jettie Booze, MD;  Location: [place] CV LAB;  Service: Cardiovascular;  Laterality: N/A;  . LEFT HEART CATHETERIZATION WITH CORONARY ANGIOGRAM N/A 12/08/2012   Procedure: LEFT HEART CATHETERIZATION WITH CORONARY ANGIOGRAM;  Surgeon: Jettie Booze,  MD;  Location: [place] CATH LAB;  Service: Cardiovascular;  Laterality: N/A;  . MOUTH SURGERY  2010?   "for bone loss" (06/24/2012)  . PERCUTANEOUS CORONARY STENT INTERVENTION (PCI-S) N/A 10/09/2012   Procedure: PERCUTANEOUS CORONARY STENT INTERVENTION (PCI-S);  Surgeon: Jettie Booze, MD;  Location: [facility] CATH LAB;  Service:  Cardiovascular;  Laterality: N/A;  . THORACOTOMY/LOBECTOMY Right 03/02/2013   Procedure: Right Video Assisted Thoracoscopy/Thoracotomy with upper Lobectomy;  Surgeon: Gaye Pollack, MD;  Location: [facility] OR;  Service: Thoracic;  Laterality: Right;  Right Lung Upper  Lobectomy   . TONSILLECTOMY  1960  . VIDEO BRONCHOSCOPY  09/12/2011   Procedure: VIDEO BRONCHOSCOPY WITH FLUORO;  Surgeon: Tanda Rockers, MD;  Location: [facility];  Service: Cardiopulmonary;  Laterality: Bilateral;     Medications Prior to Admission: Prior to Admission medications   Medication Sig Start Date End Date Taking? Authorizing Provider  albuterol (PROAIR HFA) 108 (90 Base) MCG/ACT inhaler Inhale 2 puffs into the lungs every 6 (six) hours as needed for wheezing or shortness of breath.   Yes [provider]  albuterol (PROVENTIL) (2.5 MG/3ML) 0.083% nebulizer solution Take 3 mLs (2.5 mg total) by nebulization every 4 (four) hours as needed for wheezing or shortness of breath. 07/07/18  Yes Tanda Rockers, MD  Alirocumab (PRALUENT) 150 MG/ML SOAJ Inject 1 pen into the skin every 14 (fourteen) days. Patient taking differently: Inject 150 mg into the skin every 14 (fourteen) days. Tuesdays. 03/06/18  Yes Jettie Booze, MD  Cholecalciferol 25 MCG (1000 UT) tablet Take 1,000 Units by mouth daily.   Yes [provider]  clopidogrel (PLAVIX) 75 MG tablet TAKE 1 TABLET(75 MG) BY MOUTH DAILY Patient taking differently: Take 75 mg by mouth daily.  07/02/18  Yes Imogene Burn, PA-C  diltiazem (CARDIZEM CD) 240 MG 24 hr capsule Take 1 capsule (240 mg total) by mouth daily. 07/02/18  Yes Imogene Burn, PA-C  Fluticasone-Umeclidin-Vilant (TRELEGY ELLIPTA) 100-62.5-25 MCG/INH AEPB Inhale 1 puff into the lungs daily. 07/07/18  Yes Tanda Rockers, MD  furosemide (LASIX) 40 MG tablet Take 80 mg by mouth 2 (two) times daily. Morning & lunch   Yes [provider]  Hypromellose (GENTEAL MILD) 0.2 % SOLN Place 2  drops into both eyes 4 (four) times daily as needed (dry eyes).    Yes [provider]  lansoprazole (PREVACID) 30 MG capsule Take 1 capsule (30 mg total) by mouth daily at 12 noon. Patient taking differently: Take 30 mg by mouth daily before breakfast.  07/18/16  Yes Jettie Booze, MD  NASAL SALINE NA Place 1 spray into the nose daily as needed (congestion).    Yes [provider]  nitroGLYCERIN (NITROSTAT) 0.4 MG SL tablet Place 1 tablet (0.4 mg total) under the tongue every 5 (five) minutes as needed for chest pain. 12/03/17  Yes Imogene Burn, PA-C  OXYGEN 2lpm with sleep   Yes [provider]  potassium chloride SA (K-DUR) 20 MEQ tablet Take 1 tablet (20 mEq total) by mouth 2 (two) times daily. Patient taking differently: Take 20 mEq by mouth 2 (two) times daily. Morning & Afternoon 08/13/18  Yes Weaver, Scott T, PA-C  predniSONE (DELTASONE) 10 MG tablet 2 daily until better then 1 daily thereafter Patient taking differently: Take 10-20 mg by mouth daily. Varies based on respiratory issues. 06/23/18  Yes Tanda Rockers, MD  ranitidine (ZANTAC) 150 MG capsule Take 150 mg by mouth at bedtime as needed  for heartburn.   Yes [provider]  simethicone (MYLICON) 80 MG chewable tablet Chew 160 mg by mouth as needed for flatulence.    Yes [provider]  Specialty Vitamins Products ([employer] MENOPAUSE SUPPORT PO) Take 1 capsule by mouth daily.   Yes [provider]  fluticasone (FLONASE) 50 MCG/ACT nasal spray Place 1 spray into both nostrils daily as needed (head congestion.).  09/23/14   [provider]  ibuprofen (ADVIL) 200 MG tablet Take 200 mg by mouth every 6 (six) hours as needed.    [provider]  Spacer/Aero-Holding Chambers (AEROCHAMBER MV) inhaler Use as instructed 01/09/18   Lauraine Rinne, NP     Allergies:    Allergies  Allergen Reactions  . Azithromycin Shortness Of Breath  . Ceclor [Cefaclor] Shortness Of  Breath  . Lipitor [Atorvastatin] Other (See Comments)    Severe muscle aches  . Morphine And Related Nausea And Vomiting and Other (See Comments)    Terrible headache  . Penicillins Other (See Comments)    Unknown from childhood Did it involve swelling of the face/tongue/throat, SOB, or low BP? No Did it involve sudden or severe rash/hives, skin peeling, or any reaction on the inside of your mouth or nose? No Did you need to seek medical attention at a hospital or doctor's office? Yes When did it last happen?Infancy  If all above answers are "NO", may proceed with cephalosporin use.   Sarina Ill [Sulfamethoxazole-Trimethoprim] Shortness Of Breath and Rash  . Simvastatin Other (See Comments)    Severe muscle aches  . Vicodin [Hydrocodone-Acetaminophen] Nausea And Vomiting  . Zetia [Ezetimibe] Other (See Comments)    Severe stomach pain  . Doxycycline Swelling and Other (See Comments)    Redness on the face  . Adhesive [Tape] Other (See Comments)    Redness and swelling - use paper tape    Social History:   Social History   Socioeconomic History  . Marital status: Married    Spouse name: Not on file  . Number of children: 1  . Years of education: Not on file  . Highest education level: Not on file  Occupational History  . 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 Financial resource strain: Not on file  . Food insecurity    Worry: Not on file    Inability: Not on file  . Transportation needs    Medical: Not on file    Non-medical: Not on file  Tobacco Use  . Smoking status: Former Smoker    Packs/day: 0.50    Years: 40.00    Pack years: 20.00    Types: Cigarettes    Quit date: 10/08/2012    Years since quitting: 5.8  . Smokeless tobacco: Never Used  Substance and Sexual Activity  . Alcohol use: No  . Drug use: No  . Sexual activity: Yes  Lifestyle  . Physical activity    Days per week: Not on file    Minutes per session: Not on file  . Stress: Not on file   Relationships  . Social Herbalist on phone: Not on file    Gets together: Not on file    Attends religious service: Not on file    Active member of club or organization: Not on file    Attends meetings of clubs or organizations: Not on file    Relationship status: Not on file  . Intimate partner violence    Fear of current  or ex partner: Not on file    Emotionally abused: Not on file    Physically abused: Not on file    Forced sexual activity: Not on file  Other Topics Concern  . Not on file  Social History Narrative  . Not on file    Family History:   The patient's family history includes Alcohol abuse in her father; Lupus in her mother; Migraines in her sister; Other in her sister; Pulmonary fibrosis in her brother. There is no history of Heart attack.    ROS:  Please see the history of present illness.  All other ROS reviewed and negative.     Physical Exam/Data:   Vitals:   08/19/18 0608 08/19/18 0743  BP: 118/80   Pulse: 93   Resp: 16   Temp: (!) 96.9 F (36.1 C)   TempSrc: Skin   SpO2: 100% 100%  Weight: 44.9 kg   Height: 4' 11.5" (1.511 m)    No intake or output data in the 24 hours ending 08/19/18 0858 Last 3 Weights 08/19/2018 08/12/2018 08/04/2018  Weight (lbs) 99 lb 103 lb 103 lb 6.4 oz  Weight (kg) 44.906 kg 46.72 kg 46.902 kg     Body mass index is 19.66 kg/m.  General:  Well nourished, well developed, in no acute distress HEENT: normal Lymph: no adenopathy Neck: + JVD Endocrine:  No thryomegaly Vascular: No carotid bruits; FA pulses 2+ bilaterally without bruits  Cardiac:  normal S1, S2; RRR; Systolic murmur.  Lungs:  clear to auscultation bilaterally, no wheezing, rhonchi or rales  Abd: soft, nontender, no hepatomegaly  Ext: 1-2 + bilateral LE edema Musculoskeletal:  No deformities, BUE and BLE strength normal and equal Skin: warm and dry  Neuro:  CNs 2-12 intact, no focal abnormalities noted Psych:  Normal affect    Relevant CV  Studies: Echo 08/12/18:  1. Dyskinesis of the left ventricular apex. Hypokinesis of the anterior wall and anterior septum.  2. The left ventricle has severely reduced systolic function, with an ejection fraction of 25-30%. The cavity size was moderately dilated. Left ventricular diastolic Doppler parameters are consistent with pseudonormalization. Elevated mean left atrial  pressure Left ventricular diffuse hypokinesis.  3. The right ventricle has mildly reduced systolic function. The cavity was normal. There is moderately increased right ventricular wall thickness. Right ventricular systolic pressure is moderately elevated with an estimated pressure of 55.3 mmHg.  4. Left atrial size was mild-moderately dilated.  5. Right atrial size was mildly dilated.  6. Mild thickening of the mitral valve leaflet. There is moderate mitral annular calcification present. Mitral valve regurgitation is severe by color flow Doppler. The MR jet is eccentric laterally directed. No evidence of mitral valve stenosis.  7. The aortic valve is tricuspid. Severely thickening of the aortic valve. Severe calcifcation of the aortic valve. Aortic valve regurgitation is trivial by color flow Doppler.  8. Severe aortic stenosis: mean gradient 47 mm Hg, calculated AV area 0.35 cm sq.  9. Tricuspid valve regurgitation is mild-moderate. 10. The aortic root and ascending aorta are normal in size and structure. 11. When compared to the prior study: Compared to September 2019, aortic stenosis has worsened, the left ventricle is now dilated and severely depressed and there is marked worsening of mitral regurgitation, which is now severe.  SUMMARY   Discussed with Dr. Casandra Doffing, MD and Richardson Dopp, [facility].  FINDINGS  Left Ventricle: The left ventricle has severely reduced systolic function, with an ejection fraction of 25-30%. The  cavity size was moderately dilated. There is no increase in left ventricular wall thickness. Left  ventricular diastolic Doppler  parameters are consistent with pseudonormalization. Elevated mean left atrial pressure Left ventricular diffuse hypokinesis. Mild dyskinesis of the left ventricular, entire apical segment.  Right Ventricle: The right ventricle has mildly reduced systolic function. The cavity was normal. There is moderately increased right ventricular wall thickness. Right ventricular systolic pressure is moderately elevated with an estimated pressure of  55.3 mmHg.  Left Atrium: Left atrial size was mild-moderately dilated.  Right Atrium: Right atrial size was mildly dilated. Right atrial pressure is estimated at 15 mmHg.  Interatrial Septum: No atrial level shunt detected by color flow Doppler.  Pericardium: There is no evidence of pericardial effusion.  Mitral Valve: The mitral valve is normal in structure. Mild thickening of the mitral valve leaflet. There is moderate mitral annular calcification present. Mitral valve regurgitation is severe by color flow Doppler. The MR jet is eccentric laterally  directed. No evidence of mitral valve stenosis.  Tricuspid Valve: The tricuspid valve is normal in structure. Tricuspid valve regurgitation is mild-moderate by color flow Doppler.  Aortic Valve: The aortic valve is tricuspid Severely thickening of the aortic valve. Severe calcifcation of the aortic valve. Aortic valve regurgitation is trivial by color flow Doppler.  Pulmonic Valve: The pulmonic valve was grossly normal. Pulmonic valve regurgitation is not visualized by color flow Doppler.  Aorta: The aortic root and ascending aorta are normal in size and structure.  Compared to previous exam: Compared to September 2019, aortic stenosis has worsened, the left ventricle is now dilated and severely depressed and there is marked worsening of mitral regurgitation, which is now severe.    +--------------+--------++ LEFT VENTRICLE         +----------------+---------++  +--------------+--------++ Diastology                PLAX 2D                +----------------+---------++ +--------------+--------++ LV e' lateral:  4.57 cm/s LVIDd:        6.44 cm  +----------------+---------++ +--------------+--------++ LV E/e' lateral:35.7      LVIDs:        5.62 cm  +----------------+---------++ +--------------+--------++ LV e' medial:   4.03 cm/s LV PW:        0.64 cm  +----------------+---------++ +--------------+--------++ LV E/e' medial: 40.4      LV IVS:       0.76 cm  +----------------+---------++ +--------------+--------++ LVOT diam:    2.00 cm  +--------------+--------++ LV SV:        57 ml    +--------------+--------++ LV SV Index:  40.27    +--------------+--------++ LVOT Area:    3.14 cm +--------------+--------++                        +--------------+--------++  +---------------+---------++ RIGHT VENTRICLE          +---------------+---------++ RV S prime:    9.68 cm/s +---------------+---------++ TAPSE (M-mode):1.4 cm    +---------------+---------++ RVSP:          55.3 mmHg +---------------+---------++  +---------------+-------++-----------++ LEFT ATRIUM           Index       +---------------+-------++-----------++ LA diam:       4.00 cm2.85 cm/m  +---------------+-------++-----------++ LA Vol (A2C):  86.4 ml61.65 ml/m +---------------+-------++-----------++ LA Vol (A4C):  70.2 ml50.09 ml/m +---------------+-------++-----------++ LA Biplane Vol:78.3 ml55.87 ml/m +---------------+-------++-----------++ +------------+----------++-----------++ RIGHT ATRIUM  Index       +------------+----------++-----------++ RA Pressure:15.00 mmHg            +------------+----------++-----------++ RA Area:    17.40 cm             +------------+----------++-----------++ RA Volume:  49.80 ml  35.53 ml/m  +------------+----------++-----------++  +------------------+------------++ AORTIC VALVE                   +------------------+------------++ AV Area (Vmax):   0.35 cm     +------------------+------------++ AV Area (Vmean):  0.32 cm     +------------------+------------++ AV Area (VTI):    0.36 cm     +------------------+------------++ AV Vmax:          481.99 cm/s  +------------------+------------++ AV Vmean:         305.428 cm/s +------------------+------------++ AV VTI:           0.832 m      +------------------+------------++ AV Peak Grad:     92.9 mmHg    +------------------+------------++ AV Mean Grad:     47.3 mmHg    +------------------+------------++ LVOT Vmax:        53.90 cm/s   +------------------+------------++ LVOT Vmean:       30.900 cm/s  +------------------+------------++ LVOT VTI:         0.096 m      +------------------+------------++ LVOT/AV VTI ratio:0.11         +------------------+------------++ AR PHT:           285 msec     +------------------+------------++   +-------------+-------++ AORTA                +-------------+-------++ Ao Root diam:2.70 cm +-------------+-------++ Ao Asc diam: 3.00 cm +-------------+-------++  +--------------+---------++    +---------------+-----------++ MITRAL VALVE               TRICUSPID VALVE            +--------------+---------++    +---------------+-----------++ MV Area (PHT):3.19 cm     TR Peak grad:  40.3 mmHg   +--------------+---------++    +---------------+-----------++ MV Peak grad: 9.7 mmHg     TR Vmax:       319.00 cm/s +--------------+---------++    +---------------+-----------++ MV Mean grad: 3.0 mmHg     Estimated RAP: 15.00 mmHg  +--------------+---------++    +---------------+-----------++ MV Vmax:      1.56 m/s     RVSP:          55.3 mmHg   +--------------+---------++     +---------------+-----------++ MV Vmean:     82.6 cm/s +--------------+---------++    +--------------+-------+ MV VTI:       0.35 m       SHUNTS                +--------------+---------++    +--------------+-------+ MV PHT:       69 msec      Systemic VTI: 0.10 m  +--------------+---------++    +--------------+-------+ MV Decel Time:162 msec     Systemic Diam:2.00 cm +--------------+---------++    +--------------+-------+ +---------------+-----------++ MR Peak grad:  166.9 mmHg  +---------------+-----------++ MR Mean grad:  93.5 mmHg   +---------------+-----------++ MR Vmax:       646.00 cm/s +---------------+-----------++ MR Vmean:      436.0 cm/s  +---------------+-----------++ MR PISA:       0.57 cm    +---------------+-----------++ MR PISA Radius:0.30 cm     +---------------+-----------++ +--------------+-----------++ MV E velocity:163.00 cm/s +--------------+-----------++ MV A velocity:96.70 cm/s  +--------------+-----------++ MV E/A ratio: 1.69        +--------------+-----------++  Laboratory Data:  Chemistry Recent Labs  Lab 08/12/18 1408 08/18/18 1115  NA 138 139  K 4.1 3.6  CL 90* 91*  CO2 32* 30*  GLUCOSE 174* 133*  BUN 19 24  CREATININE 0.80 0.85  CALCIUM 9.1 9.5  GFRNONAA 78 72  GFRAA 89 83    No results for input(s): PROT, ALBUMIN, AST, ALT, ALKPHOS, BILITOT in the last 168 hours. HematologyNo results for input(s): WBC, RBC, HGB, HCT, MCV, MCH, MCHC, RDW, PLT in the last 168 hours. Cardiac EnzymesNo results for input(s): TROPONINI in the last 168 hours. No results for input(s): TROPIPOC in the last 168 hours.  BNP Recent Labs  Lab 08/12/18 1408  PROBNP 33,156*    DDimer No results for input(s): DDIMER in the last 168 hours.  Radiology/Studies:  No results found.  Assessment and Plan:   1. Severe aortic stenosis 2. Severe mitral regurgitation 3. Acute on chronic systolic and  diastolic CHF/Cardiomyopathy  4. CAD without angina  Will admit to telemetry unit. Will start Lasix 80 mg IV BID. Continue other home meds. Will begin TAVR workup.    For questions or updates, please contact [place] Please consult [email or web address] for contact info under        Signed, Lauree Chandler, MD  08/19/2018 8:58 AM

## 2018-08-20 ENCOUNTER — Telehealth: Payer: PPO | Admitting: Physician Assistant

## 2018-08-20 ENCOUNTER — Encounter (HOSPITAL_COMMUNITY): Payer: Self-pay | Admitting: Cardiovascular Disease

## 2018-08-20 DIAGNOSIS — I5043 Acute on chronic combined systolic (congestive) and diastolic (congestive) heart failure: Secondary | ICD-10-CM

## 2018-08-20 LAB — CBC
HCT: 37.8 % (ref 36.0–46.0)
Hemoglobin: 11.5 g/dL — ABNORMAL LOW (ref 12.0–15.0)
MCH: 27 pg (ref 26.0–34.0)
MCHC: 30.4 g/dL (ref 30.0–36.0)
MCV: 88.7 fL (ref 80.0–100.0)
Platelets: 287 10*3/uL (ref 150–400)
RBC: 4.26 MIL/uL (ref 3.87–5.11)
RDW: 14.7 % (ref 11.5–15.5)
WBC: 8.8 10*3/uL (ref 4.0–10.5)
nRBC: 0 % (ref 0.0–0.2)

## 2018-08-20 LAB — BASIC METABOLIC PANEL
Anion gap: 10 (ref 5–15)
BUN: 18 mg/dL (ref 8–23)
CO2: 35 mmol/L — ABNORMAL HIGH (ref 22–32)
Calcium: 9.1 mg/dL (ref 8.9–10.3)
Chloride: 93 mmol/L — ABNORMAL LOW (ref 98–111)
Creatinine, Ser: 0.59 mg/dL (ref 0.44–1.00)
GFR calc Af Amer: >60 mL/min (ref >60)
GFR calc non Af Amer: >60 mL/min (ref >60)
Glucose, Bld: 133 mg/dL — ABNORMAL HIGH (ref 70–99)
Potassium: 3.2 mmol/L — ABNORMAL LOW (ref 3.5–5.1)
Sodium: 138 mmol/L (ref 135–145)

## 2018-08-20 NOTE — Progress Notes (Signed)
Progress Note  Patient Name: Teresa [name] Date of Encounter: 08/20/2018  Primary Cardiologist: Larae Grooms, MD   Subjective   Breathing better  No CP    Inpatient Medications    Scheduled Meds: . clopidogrel  75 mg Oral Daily  . diltiazem  240 mg Oral Daily  . fluticasone furoate-vilanterol  1 puff Inhalation Daily  . furosemide  80 mg Intravenous Q12H  . potassium chloride SA  20 mEq Oral BID  . sodium chloride flush  3 mL Intravenous Q12H  . umeclidinium bromide  1 puff Inhalation Daily   Continuous Infusions: . sodium chloride     PRN Meds: sodium chloride, acetaminophen, albuterol, nitroGLYCERIN, ondansetron (ZOFRAN) IV, sodium chloride flush   Vital Signs    Vitals:   08/20/18 0011 08/20/18 0438 08/20/18 0738 08/20/18 0810  BP: 123/83 117/81 110/75   Pulse: 95 86 95   Resp: 18 18 18    Temp: 97.6 F (36.4 C) 97.6 F (36.4 C) (!) 97.4 F (36.3 C)   TempSrc: Oral Oral Oral   SpO2: 98% 97% 98% 96%  Weight:  45.5 kg    Height:        Intake/Output Summary (Last 24 hours) at 08/20/2018 0845 Last data filed at 08/20/2018 0443 Gross per 24 hour  Intake 766.57 ml  Output 1600 ml  Net -833.43 ml   NEt NEG since admit:  1.7 L   Filed Weights   08/19/18 0608 08/19/18 1231 08/20/18 0438  Weight: 44.9 kg 47 kg 45.5 kg    Telemetry    SR - Personally Reviewed  Physical Exam   Physical exam per MD:  UUV:OZDG 68 yo No acute distress.   Neck: No JVD, no carotid bruits Cardiac: RRR  Normal S1, decreased S2  III/VI systolic murmur base (later peaking) rubs, or gallops.  Respiratory: Clear to ausculttion bilaterally, no wheezes/ rales/ rhonchi GI: NABS, Soft, nontender, non-distended  MS: No edema; No deformity. Neuro:  Nonfocal, moving all extremities spontaneously Psych: Normal affect   Labs    Chemistry Recent Labs  Lab 08/18/18 1115 08/19/18 0819  NA 139 142  135  K 3.6 3.0*  3.6  CL 91*  --   CO2 30*  --   GLUCOSE 133*  --    BUN 24  --   CREATININE 0.85  --   CALCIUM 9.5  --   GFRNONAA 72  --   GFRAA 83  --      Hematology Recent Labs  Lab 08/19/18 0819  HGB 10.9*  12.6  HCT 32.0*  37.0    Cardiac EnzymesNo results for input(s): TROPONINI in the last 168 hours. No results for input(s): TROPIPOC in the last 168 hours.   BNPNo results for input(s): BNP, PROBNP in the last 168 hours.   DDimer No results for input(s): DDIMER in the last 168 hours.   Radiology    No results found.  Cardiac Studies   Left heart catheterization 08/19/2018:  Previously placed Mid RCA to Dist RCA stent (unknown type) is widely patent.  Prox RCA lesion is 50% stenosed.  Previously placed Ost Cx to Mid Cx stent (unknown type) is widely patent.  Previously placed Ost LM to Mid LAD stent (unknown type) is widely patent.  Dist Cx lesion is 30% stenosed.  3rd Mrg lesion is 100% stenosed.   1. Triple vessel CAD.  2. Patent stents in the left main, LAD and Circumflex (placed at the time of dissection) 3. Chronic occlusion  small Obtuse marginal branch which fills from right to left collaterals 4. Patent mid to distal RCA stent. The proximal RCA has a moderate non-obstructive stenosis which does not appear to be flow limiting. This is unchanged from her last cath.  5. Severe aortic stenosis (mean gradient 21 mmHg, peak to peak gradient 36 mmHg, AVA 0.71 cm2) 6. Elevated filling pressures c/w acute volume overload (RA 9/14, RV 57/11/12, PA 58/27 mean 39, PCWP 33)  Recommendations: Medical management of CAD. I think the proximal RCA stenosis is unchanged from her last cath and does not appear to be flow limiting. She has severe AS. Will begin workup for TAVR. I suspect she will be a TAVR candidate although she would be a surgical candidate as well if we leaned toward open surgical  AVR. She is volume overloaded today. Will admit for diuresis with IV Lasix.   Echocardiogram 08/20/2018: IMPRESSIONS    1. Dyskinesis of  the left ventricular apex. Hypokinesis of the anterior wall and anterior septum.  2. The left ventricle has severely reduced systolic function, with an ejection fraction of 25-30%. The cavity size was moderately dilated. Left ventricular diastolic Doppler parameters are consistent with pseudonormalization. Elevated mean left atrial  pressure Left ventricular diffuse hypokinesis.  3. The right ventricle has mildly reduced systolic function. The cavity was normal. There is moderately increased right ventricular wall thickness. Right ventricular systolic pressure is moderately elevated with an estimated pressure of 55.3 mmHg.  4. Left atrial size was mild-moderately dilated.  5. Right atrial size was mildly dilated.  6. Mild thickening of the mitral valve leaflet. There is moderate mitral annular calcification present. Mitral valve regurgitation is severe by color flow Doppler. The MR jet is eccentric laterally directed. No evidence of mitral valve stenosis.  7. The aortic valve is tricuspid. Severely thickening of the aortic valve. Severe calcifcation of the aortic valve. Aortic valve regurgitation is trivial by color flow Doppler.  8. Severe aortic stenosis: mean gradient 47 mm Hg, calculated AV area 0.35 cm sq.  9. Tricuspid valve regurgitation is mild-moderate. 10. The aortic root and ascending aorta are normal in size and structure. 11. When compared to the prior study: Compared to September 2019, aortic stenosis has worsened, the left ventricle is now dilated and severely depressed and there is marked worsening of mitral regurgitation, which is now severe.   Patient Profile     68 y.o. female with history of CAD (prior stenting of the LM, LAD and circumflex in the setting of dissection), prior RCA stenting, AAA, chronic bronchitis, chronic systolic and diastolic CHF, GERD, HLD, PAD, aortic stenosis and mitral regurgitation, ischemic cardiomyopathy and tobacco abuse who is being admitted post cath  for volume overload and diuresis.  Assessment & Plan    1. Acute on chronic combined CHF: patient noted to be volume overloaded on R/LHC to evaluate severe aortic stenosis yesterday.Echo 08/12/2018 with EF 25-30%. She was started on IV lasix with UOP net -816mL overnight. Cr pending this AM - Continue IV lasix  Diuresing nicely   Follow BP, I/O and Cr - Continue to monitor strict I&O and daily weights - Will stop diltiazem given low EF - Consider starting BBlocker   Follow BP  2. Severe aortic stenosis: noted on both recent echo and R/LHC yesterday.  - Continue TAVR work-up per structural heart team  3. Severe mitral regurgitation: noted on recent echo - Continue diuresis as above  4. CAD s/p PCI to LM, LAD, and LCx in the  setting of dissection: cath yesterday with stable multivessel CAD with patent stents.  - Not on ASA On Plavix - Pt is on praluent as an outpt    For questions or updates, please contact [place] Please consult [email or web address] for contact info under Cardiology/STEMI.      Signed, Abigail Butts, PA-C  08/20/2018, 8:45 AM   [phone]  Pt seen and examined   I have amended note above by Teodoro Kil to reflect my findings Pt is diuresing nicely   WIll continue ON exam:  Pt comfortable in bed   Neck:  JVP is increased Lungs are rel clear    Cardiac exam  RRR  Gr III/VI later peaking systolic murmur base Ext with triv edema  Will review with structural team further testing.  Dorris Carnes MD

## 2018-08-21 ENCOUNTER — Encounter (HOSPITAL_COMMUNITY): Payer: Self-pay | Admitting: Thoracic Surgery (Cardiothoracic Vascular Surgery)

## 2018-08-21 DIAGNOSIS — I340 Nonrheumatic mitral (valve) insufficiency: Secondary | ICD-10-CM

## 2018-08-21 DIAGNOSIS — I350 Nonrheumatic aortic (valve) stenosis: Secondary | ICD-10-CM

## 2018-08-21 LAB — BRAIN NATRIURETIC PEPTIDE: B Natriuretic Peptide: >4500 pg/mL — ABNORMAL HIGH (ref 0.0–100.0)

## 2018-08-21 MED ORDER — ALUM & MAG HYDROXIDE-SIMETH 200-200-20 MG/5ML PO SUSP
30.0000 mL | ORAL | Status: DC | PRN
  Administered 2018-08-21 – 2018-08-24 (×5): 30 mL via ORAL
  Filled 2018-08-21 (×5): qty 30

## 2018-08-21 MED ORDER — POTASSIUM CHLORIDE CRYS ER 20 MEQ PO TBCR
40.0000 meq | EXTENDED_RELEASE_TABLET | Freq: Two times a day (BID) | ORAL | Status: DC
  Administered 2018-08-21 – 2018-08-24 (×8): 40 meq via ORAL
  Filled 2018-08-21 (×8): qty 2

## 2018-08-21 NOTE — Care Management Important Message (Signed)
Important Message  Patient Details  Name: BLANKA ROCKHOLT MRN: [id number] Date of Birth: 21-Jan-1951   Medicare Important Message Given:  Yes    [name] 08/21/2018, 12:55 PM

## 2018-08-21 NOTE — Consult Note (Signed)
[address]       [place] [address]             [phone]          CARDIOTHORACIC SURGERY CONSULTATION REPORT  PCP is Leeroy Cha, MD Referring Provider is Burnell Blanks, MD Primary Cardiologist is Larae Grooms, MD  Reason for consultation:  Severe aortic stenosis and mitral regurgitation  HPI:  Patient is a 68 year old female with history of aortic stenosis, coronary artery disease, chronic combined systolic and diastolic congestive heart failure, lung cancer status post surgical resection, and severe COPD on home oxygen therapy who was admitted to the hospital with acute exacerbation of chronic combined systolic and diastolic congestive heart failure who has been referred for surgical consultation to discuss treatment options for management of severe aortic stenosis and mitral regurgitation.  Patient's cardiac history dates back to 2014 when she was found to have lung mass consistent with early stage lung cancer.  She was referred for preoperative cardiac evaluation and underwent catheterization demonstrating single-vessel coronary artery disease involving the right coronary artery.  She underwent PCI and stenting of the right coronary artery and this procedure was complicated by coronary dissection which required stenting of the left main, left anterior descending coronary artery, and left circumflex coronary arteries.  She ultimately recovered and eventually underwent right upper lobectomy by Dr. Cyndia Bent for stage Ib (T2a, N0, M0) poorly differentiated squamous cell carcinoma of the lung in December 2014.  She quit smoking permanently at that time and has been followed regularly ever since by Dr. Earlie Server.  She has remained free of signs of recurrent cancer.  She has been followed by Dr. Melvyn Novas for management of underlying severe COPD and she has remained on home oxygen which she typically uses only at night for helping with sleep.  She did  well from a cardiac standpoint until 2018 when she was hospitalized with congestive heart failure.  Echocardiogram performed at that time revealed findings consistent with severe aortic stenosis with mild to moderate left ventricular systolic dysfunction and ejection fraction estimated 45 to 50%.  Peak velocity across the aortic valve at that time was measured 4.5 m/s corresponding to mean transvalvular gradient estimated 52 mmHg with aortic valve area calculated 0.44 cm and DVI reported 0.14.  She underwent diagnostic cardiac catheterization at that time which revealed continued patency of all previous coronary stents and what was felt to be mild aortic stenosis.  She has continued to have problems with shortness of breath ever since.  She was hospitalized again in August 2019 with another episode of acute exacerbation of congestive heart failure.  Echocardiogram performed November 05, 2017 again revealed findings consistent with severe aortic stenosis with peak velocity across the aortic valve measured 4.3 m/s corresponding to mean transvalvular gradient estimated 45 mmHg and aortic valve area calculated 0.69 cm with DVI reported 0.22.  Mild to moderate mitral regurgitation was reported at that time.  Medical therapy was recommended.  Last fall she was seen in the emergency department on 2 occasions for acute exacerbations of shortness of breath and recently she was seen in follow-up by Dr. Melvyn Novas with worsening symptoms of shortness of breath.  Chest x-ray performed at that time revealed vascular congestion and proBNP level was elevated >4974.  A follow-up echocardiogram was requested and performed on August 12, 2018 which revealed severe left ventricular systolic dysfunction with ejection fraction estimated only 25 to 30%.  There was moderate left ventricular  chamber enlargement.  There remained severe aortic stenosis with peak velocity across the aortic valve measured 4.8 m/s corresponding to mean transvalvular  gradient estimated 47 mmHg and aortic valve area calculated 0.35 cm.  The DVI was reported 0.11.  There was also severe mitral regurgitation.  The patient was scheduled for elective left and right heart catheterization which was performed August 19, 2018 by Dr. Angelena Form.  Catheterization revealed continued patency of the stents in the right coronary artery, left main coronary artery, the left anterior descending coronary artery, and the left circumflex coronary arteries.  There was 50% proximal stenosis of the right coronary artery and 100% occlusion of the third obtuse marginal branch of the left circumflex.  Peak to peak and mean transvalvular gradients across the aortic valve were measured 36 and 21 mmHg, respectively with aortic valve area calculated 0.71 cm.  There was moderate to severe pulmonary hypertension with PA pressures measured 58/27 and mean pulmonary capillary wedge pressure 33 mmHg.  The patient was notably volume overloaded with severely elevated filling pressures and subsequently admitted to the hospital for acute exacerbation of chronic congestive heart failure.  Cardiothoracic surgical consultation was requested.  Patient is married and lives locally in [place] with her husband.  She has 1 adult son who lives in [place] and is supportive.  The patient has been retired for several years because of chronic shortness of breath, having previously worked as a Psychologist, clinical.  She has a remote history of tobacco abuse but she quit smoking at the time of her lung cancer surgery in 2014.  She has chronic shortness of breath which has been considerably worse over the past year.  She now gets short of breath with very low level activity and she has recently had several episodes of shortness of breath at rest.  She cannot lay flat in bed and she reports episodes of PND.  She denies any symptoms of exertional chest pain or chest tightness.  At the time of admission she had some lower extremity  edema which has now resolved.  She denies any history of dizzy spells or syncope.  She denies any recent productive cough.  She reports a chronic dry "hacking" cough.  She denies any fevers.  She has been practicing social distancing and she denies any known exposure to persons with known or suspected COVID-19 infection.  Past Medical History:  Diagnosis Date   AAA (abdominal aortic aneurysm) ([facility])    a. small by cath 2014.   Arthritis    "across my hips; buttocks; comes w/the weather" (02/27/2013)   CAD (coronary artery disease), native coronary artery    a. 10/2012 - rotational atherectomy of LAD c/b dissection s/p PCI to LAD, LCx, LM. b. 12/2012 - s/p overlapping DES to RCA.   Cataract    "just the beginnings on the right" (02/27/2013)   Chronic combined systolic and diastolic CHF (congestive heart failure) (HCC)    COPD (chronic obstructive pulmonary disease) ([facility])    "little bit" (02/27/2013)   GERD (gastroesophageal reflux disease)    Hypercholesterolemia    Ischemic cardiomyopathy    a. EF 45-50%.   Mass of lung    "small tumor RUL; they are watching it" (02/27/2013)   Peripheral vascular disease, unspecified (HCC)    Severe aortic stenosis    Severe mitral regurgitation    Tachycardia, unspecified    Tobacco abuse     Past Surgical History:  Procedure Laterality Date   CARDIAC CATHETERIZATION  08/2018  CORONARY ANGIOPLASTY WITH STENT PLACEMENT  10/2012; 12/08/2012   "7 + 3" (02/27/2013)   ESOPHAGOGASTRODUODENOSCOPY (EGD) WITH PROPOFOL N/A 01/11/2015   Procedure: ESOPHAGOGASTRODUODENOSCOPY (EGD) WITH PROPOFOL;  Surgeon: Garlan Fair, MD;  Location: [facility];  Service: Endoscopy;  Laterality: N/A;   FLEXIBLE BRONCHOSCOPY N/A 03/02/2013   Procedure: FLEXIBLE BRONCHOSCOPY;  Surgeon: Gaye Pollack, MD;  Location: [facility];  Service: Thoracic;  Laterality: N/A;   FLEXIBLE SIGMOIDOSCOPY N/A 01/11/2015   Procedure: FLEXIBLE SIGMOIDOSCOPY;  Surgeon: Garlan Fair, MD;  Location: [facility];  Service: Endoscopy;  Laterality: N/A;  unable to complete colon-prep issues   GANGLION CYST EXCISION Left 1975   "wrist"   LEFT HEART CATH AND CORONARY ANGIOGRAPHY N/A 06/29/2016   Procedure: Left Heart Cath and Coronary Angiography;  Surgeon: Jettie Booze, MD;  Location: [facility];  Service: Cardiovascular;  Laterality: N/A;   LEFT HEART CATH AND CORONARY ANGIOGRAPHY N/A 08/19/2018   Procedure: LEFT HEART CATH AND CORONARY ANGIOGRAPHY;  Surgeon: Burnell Blanks, MD;  Location: [facility];  Service: Cardiovascular;  Laterality: N/A;   LEFT HEART CATHETERIZATION WITH CORONARY ANGIOGRAM N/A 12/08/2012   Procedure: LEFT HEART CATHETERIZATION WITH CORONARY ANGIOGRAM;  Surgeon: Jettie Booze, MD;  Location: [facility];  Service: Cardiovascular;  Laterality: N/A;   MOUTH SURGERY  2010?   "for bone loss" (06/24/2012)   PERCUTANEOUS CORONARY STENT INTERVENTION (PCI-S) N/A 10/09/2012   Procedure: PERCUTANEOUS CORONARY STENT INTERVENTION (PCI-S);  Surgeon: Jettie Booze, MD;  Location: [facility] C[facility] LAB;  Service: Cardiovascular;  Laterality: N/A;   RIGHT HEART CATH N/A 08/19/2018   Procedure: RIGHT HEART CATH;  Surgeon: Burnell Blanks, MD;  Location: [facility];  Service: Cardiovascular;  Laterality: N/A;   THORACOTOMY/LOBECTOMY Right 03/02/2013   Procedure: Right Video Assisted Thoracoscopy/Thoracotomy with upper Lobectomy;  Surgeon: Gaye Pollack, MD;  Location: [facility] OR;  Service: Thoracic;  Laterality: Right;  Right Lung Upper  Lobectomy    TONSILLECTOMY  1960   VIDEO BRONCHOSCOPY  09/12/2011   Procedure: VIDEO BRONCHOSCOPY WITH FLUORO;  Surgeon: Tanda Rockers, MD;  Location: [facility];  Service: Cardiopulmonary;  Laterality: Bilateral;    Family History  Problem Relation Age of Onset   Alcohol abuse Father    Lupus Mother    Other Sister        Degenerative disc disease   Migraines Sister     Pulmonary fibrosis Brother    Heart attack Neg Hx     Social History   Socioeconomic History   Marital status: Married    Spouse name: Not on file   Number of children: 1   Years of education: Not on file   Highest education level: Not on file  Occupational History   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  Social Needs   Financial resource strain: Not on file   Food insecurity    Worry: Not on file    Inability: Not on file   Transportation needs    Medical: Not on file    Non-medical: Not on file  Tobacco Use   Smoking status: Former Smoker    Packs/day: 0.50    Years: 40.00    Pack years: 20.00    Types: Cigarettes    Quit date: 10/08/2012    Years since quitting: 5.8   Smokeless tobacco: Never Used  Substance and Sexual Activity   Alcohol use: No   Drug use: No   Sexual activity: Yes  Lifestyle   Physical activity    Days per week: Not on file    Minutes per session: Not on file   Stress: Not on file  Relationships   Social connections    Talks on phone: Not on file    Gets together: Not on file    Attends religious service: Not on file    Active member of club or organization: Not on file    Attends meetings of clubs or organizations: Not on file    Relationship status: Not on file   Intimate partner violence    Fear of current or ex partner: Not on file    Emotionally abused: Not on file    Physically abused: Not on file    Forced sexual activity: Not on file  Other Topics Concern   Not on file  Social History Narrative   Not on file    Prior to Admission medications   Medication Sig Start Date End Date Taking? Authorizing Provider  albuterol (PROAIR HFA) 108 (90 Base) MCG/ACT inhaler Inhale 2 puffs into the lungs every 6 (six) hours as needed for wheezing or shortness of breath.   Yes [provider]  albuterol (PROVENTIL) (2.5 MG/3ML) 0.083% nebulizer solution Take 3 mLs (2.5 mg total) by nebulization every 4 (four) hours  as needed for wheezing or shortness of breath. 07/07/18  Yes Tanda Rockers, MD  Alirocumab (PRALUENT) 150 MG/ML SOAJ Inject 1 pen into the skin every 14 (fourteen) days. Patient taking differently: Inject 150 mg into the skin every 14 (fourteen) days. Tuesdays. 03/06/18  Yes Jettie Booze, MD  Cholecalciferol 25 MCG (1000 UT) tablet Take 1,000 Units by mouth daily.   Yes [provider]  clopidogrel (PLAVIX) 75 MG tablet TAKE 1 TABLET(75 MG) BY MOUTH DAILY Patient taking differently: Take 75 mg by mouth daily.  07/02/18  Yes Imogene Burn, PA-C  diltiazem (CARDIZEM CD) 240 MG 24 hr capsule Take 1 capsule (240 mg total) by mouth daily. 07/02/18  Yes Imogene Burn, PA-C  Fluticasone-Umeclidin-Vilant (TRELEGY ELLIPTA) 100-62.5-25 MCG/INH AEPB Inhale 1 puff into the lungs daily. 07/07/18  Yes Tanda Rockers, MD  furosemide (LASIX) 40 MG tablet Take 80 mg by mouth 2 (two) times daily. Morning & lunch   Yes [provider]  Hypromellose (GENTEAL MILD) 0.2 % SOLN Place 2 drops into both eyes 4 (four) times daily as needed (dry eyes).    Yes [provider]  lansoprazole (PREVACID) 30 MG capsule Take 1 capsule (30 mg total) by mouth daily at 12 noon. Patient taking differently: Take 30 mg by mouth daily before breakfast.  07/18/16  Yes Jettie Booze, MD  NASAL SALINE NA Place 1 spray into the nose daily as needed (congestion).    Yes [provider]  nitroGLYCERIN (NITROSTAT) 0.4 MG SL tablet Place 1 tablet (0.4 mg total) under the tongue every 5 (five) minutes as needed for chest pain. 12/03/17  Yes Imogene Burn, PA-C  OXYGEN 2lpm with sleep   Yes [provider]  potassium chloride SA (K-DUR) 20 MEQ tablet Take 1 tablet (20 mEq total) by mouth 2 (two) times daily. Patient taking differently: Take 20 mEq by mouth 2 (two) times daily. Morning & Afternoon 08/13/18  Yes Weaver, Scott T, PA-C  predniSONE (DELTASONE) 10 MG tablet 2 daily until better  then 1 daily thereafter Patient taking differently: Take 10-20 mg by mouth daily. Varies based on respiratory issues. 06/23/18  Yes Wert,  Christena Deem, MD  ranitidine (ZANTAC) 150 MG capsule Take 150 mg by mouth at bedtime as needed for heartburn.   Yes [provider]  simethicone (MYLICON) 80 MG chewable tablet Chew 160 mg by mouth as needed for flatulence.    Yes [provider]  Specialty Vitamins Products ([employer] MENOPAUSE SUPPORT PO) Take 1 capsule by mouth daily.   Yes [provider]  fluticasone (FLONASE) 50 MCG/ACT nasal spray Place 1 spray into both nostrils daily as needed (head congestion.).  09/23/14   [provider]  ibuprofen (ADVIL) 200 MG tablet Take 200 mg by mouth every 6 (six) hours as needed.    [provider]  Spacer/Aero-Holding Chambers (AEROCHAMBER MV) inhaler Use as instructed 01/09/18   Lauraine Rinne, NP    Current Facility-Administered Medications  Medication Dose Route Frequency Provider Last Rate Last Dose   0.9 %  sodium chloride infusion  250 mL Intravenous PRN Burnell Blanks, MD       acetaminophen (TYLENOL) tablet 650 mg  650 mg Oral Q4H PRN Burnell Blanks, MD       albuterol (PROVENTIL) (2.5 MG/3ML) 0.083% nebulizer solution 2.5 mg  2.5 mg Nebulization Q4H PRN Burnell Blanks, MD       clopidogrel (PLAVIX) tablet 75 mg  75 mg Oral Daily Burnell Blanks, MD   75 mg at 08/20/18 0831   fluticasone furoate-vilanterol (BREO ELLIPTA) 100-25 MCG/INH 1 puff  1 puff Inhalation Daily Burnell Blanks, MD   1 puff at 08/21/18 0818   furosemide (LASIX) injection 80 mg  80 mg Intravenous Q12H Burnell Blanks, MD   80 mg at 08/20/18 2115   nitroGLYCERIN (NITROSTAT) SL tablet 0.4 mg  0.4 mg Sublingual Q5 min PRN Burnell Blanks, MD       ondansetron [facility]) injection 4 mg  4 mg Intravenous Q6H PRN Burnell Blanks, MD       potassium chloride SA (K-DUR) CR tablet 20  mEq  20 mEq Oral BID Burnell Blanks, MD   20 mEq at 08/20/18 2115   sodium chloride flush (NS) 0.9 % injection 3 mL  3 mL Intravenous Q12H Lauree Chandler D, MD   3 mL at 08/20/18 2114   sodium chloride flush (NS) 0.9 % injection 3 mL  3 mL Intravenous PRN Burnell Blanks, MD       umeclidinium bromide (INCRUSE ELLIPTA) 62.5 MCG/INH 1 puff  1 puff Inhalation Daily Burnell Blanks, MD   1 puff at 08/21/18 0818    Allergies  Allergen Reactions   Azithromycin Shortness Of Breath   Ceclor [Cefaclor] Shortness Of Breath   Lipitor [Atorvastatin] Other (See Comments)    Severe muscle aches   Morphine And Related Nausea And Vomiting and Other (See Comments)    Terrible headache   Penicillins Other (See Comments)    Unknown from childhood Did it involve swelling of the face/tongue/throat, SOB, or low BP? No Did it involve sudden or severe rash/hives, skin peeling, or any reaction on the inside of your mouth or nose? No Did you need to seek medical attention at a hospital or doctor's office? Yes When did it last happen?Infancy  If all above answers are NO, may proceed with cephalosporin use.    Septra [Sulfamethoxazole-Trimethoprim] Shortness Of Breath and Rash   Simvastatin Other (See Comments)    Severe muscle aches   Vicodin [Hydrocodone-Acetaminophen] Nausea And Vomiting   Zetia [Ezetimibe] Other (See Comments)  Severe stomach pain   Doxycycline Swelling and Other (See Comments)    Redness on the face   Adhesive [Tape] Other (See Comments)    Redness and swelling - use paper tape      Review of Systems:   General:  normal appetite, decreased energy, + weight gain, no weight loss, no fever  Cardiac:  no chest pain with exertion, no chest pain at rest, +SOB with any exertion, + resting SOB, + PND, + orthopnea, no palpitations, no arrhythmia, no atrial fibrillation, + LE edema, no dizzy spells, no syncope  Respiratory:  + shortness of  breath, + home oxygen, no productive cough, + dry cough, no bronchitis, no wheezing, no hemoptysis, no asthma, no pain with inspiration or cough, no sleep apnea, no CPAP at night  GI:   no difficulty swallowing, + reflux, + frequent heartburn, no hiatal hernia, no abdominal pain, + constipation, no diarrhea, no hematochezia, no hematemesis, no melena  GU:   no dysuria,  no frequency, no urinary tract infection, no hematuria, no kidney stones, no kidney disease  Vascular:  no pain suggestive of claudication, no pain in feet, no leg cramps, no varicose veins, no DVT, no non-healing foot ulcer  Neuro:   no stroke, no TIA's, no seizures, no headaches, no temporary blindness one eye,  no slurred speech, no peripheral neuropathy, no chronic pain, no instability of gait, no memory/cognitive dysfunction  Musculoskeletal: mild arthritis - primarily involving the hip, no joint swelling, no myalgias, no difficulty walking, normal mobility   Skin:   no rash, no itching, no skin infections, no pressure sores or ulcerations  Psych:   + anxiety, no depression, no nervousness, no unusual recent stress  Eyes:   no blurry vision, no floaters, no recent vision changes, + wears glasses or contacts  ENT:   no hearing loss, no loose or painful teeth, no dentures, last saw dentist within past 3 months  Hematologic:  no easy bruising, no abnormal bleeding, no clotting disorder, no frequent epistaxis  Endocrine:  no diabetes, does not check CBG's at home     Physical Exam:   BP 115/73 (BP Location: Left Arm)    Pulse 78    Temp 98.6 F (37 C) (Oral)    Resp 19    Ht 4' 11.5" (1.511 m)    Wt 44 kg Comment: scale a   LMP 03/25/2011    SpO2 98%    BMI 19.26 kg/m   General:  Thin female NAD, appears older than stated age  HEENT:  Unremarkable   Neck:   no JVD, no bruits, no adenopathy   Chest:   Bibasilar inspiratory rales, symmetrical breath sounds, no wheezes, no rhonchi   CV:   RRR, grade III harsh systolic murmur best  LSB  Abdomen:  soft, non-tender, no masses   Extremities:  warm, well-perfused, pulses diminished but palpable, no lower extremity edema  Rectal/GU  Deferred  Neuro:   Grossly non-focal and symmetrical throughout  Skin:   Clean and dry, no rashes, no breakdown  Diagnostic Tests:  Lab Results: Recent Labs    08/19/18 0819 08/20/18 0906  WBC  --  8.8  HGB 10.9*   12.6 11.5*  HCT 32.0*   37.0 37.8  PLT  --  287   BMET:  Recent Labs    08/18/18 1115 08/19/18 0819 08/20/18 0906  NA 139 142   135 138  K 3.6 3.0*   3.6 3.2*  CL 91*  --  93*  CO2 30*  --  35*  GLUCOSE 133*  --  133*  BUN 24  --  18  CREATININE 0.85  --  0.59  CALCIUM 9.5  --  9.1     Results for SADY, MONACO (MRN [id number]) as of 08/21/2018 13:10  Ref. Range 08/12/2018 14:08  NT-Pro BNP Latest Ref Range: 0 - 301 pg/mL 33,156 (H)     CBG (last 3)  No results for input(s): GLUCAP in the last 72 hours. PT/INR:  No results for input(s): LABPROT, INR in the last 72 hours.  CXR:  CHEST - 2 VIEW  COMPARISON:  01/31/2018  FINDINGS: The heart is moderately enlarged. Moderate pulmonary vascular congestion is noted. Patchy opacities with a peripheral distribution have progressed. No pneumothorax or pleural effusion. Thoracic spine is intact.  IMPRESSION: Progressive bilateral pulmonary opacities with a peripheral distribution. Findings may represent progressive interstitial lung disease, however an acute process is not excluded.  Vascular congestion.   Electronically Signed   By: Marybelle Killings M.D.   On: 08/04/2018 09:55   Transthoracic Echocardiography  (Report amended )  Patient:    Meliza, Kage MR #:       [id number] Study Date: 07/04/2016 Gender:     F Age:        70 Height:     151.1 cm Weight:     60.6 kg BSA:        1.61 m^2 Pt. Status: Room:   SONOGRAPHER  Victorio Palm, [place], Outpatient  ATTENDING    Almyra Deforest [id number]  [id number]     JIRC, [place]  [id number]  Memory Dance, Isaac Laud [id number]  cc:  ------------------------------------------------------------------- LV EF: 45% -   50%  ------------------------------------------------------------------- Indications:      (R01.1).  (I35.0).  ------------------------------------------------------------------- History:   PMH:  Acquired from the patient and from the patient&'s chart.  Anginal chest pain.  Systolic murmur.  Coronary artery disease.  Mild aortic stenosis.  Chronic obstructive pulmonary disease.  PMH:   Myocardial infarction.  Risk factors:  PVD. Current tobacco use. Dyslipidemia.  ------------------------------------------------------------------- Study Conclusions  - Left ventricle: The cavity size was normal. Wall thickness was   normal. Systolic function was mildly reduced. The estimated   ejection fraction was in the range of 45% to 50%. There is   akinesis of the apical myocardium. Doppler parameters are   consistent with abnormal left ventricular relaxation (grade 1   diastolic dysfunction). Doppler parameters are consistent with   high ventricular filling pressure. - Aortic valve: Valve mobility was restricted. There was severe   stenosis. Mean gradient (S): 52 mm Hg. Peak gradient (S): 80 mm   Hg. Valve area (VTI): 0.44 cm^2. Valve area (Vmax): 0.41 cm^2.   Valve area (Vmean): 0.43 cm^2. - Mitral valve: Calcified annulus.  Impressions:  - Definity used; apical akinesis with overall mildly reduced LV   systolic function; mild diastolic dysfunction with elevated LV   filling pressure; calcified aortic valve with severe AS (mean   gradient 52 mmHg).  ------------------------------------------------------------------- Labs, prior tests, procedures, and surgery: Catheterization (2014).    The study demonstrated coronary artery disease. Complex left main, circumflex, LAD stenting after a dissection during PCI. PCI of the RCA complicated by dissection  and required 3 stents.  Right lung reduction (2014).  ------------------------------------------------------------------- Study data:  No prior study was available for comparison.  Study status:  Routine.  Procedure:  The patient reported no pain  pre or post test. Transthoracic echocardiography for assessment of valvular function. Image quality was adequate. Intravenous contrast (Definity) was administered to opacify the LV. Transthoracic echocardiography.  M-mode, complete 2D, spectral Doppler, and color Doppler.  Birthdate:  Patient birthdate: 1950/12/24.  Age:  Patient is 68 yr old.  Sex:  Gender: female. BMI: 26.5 kg/m^2.  Blood pressure:     158/90  Patient status: Outpatient.  Study date:  Study date: 07/04/2016. Study time: 11:15 AM.  Location:  [facility]  -------------------------------------------------------------------  ------------------------------------------------------------------- Left ventricle:  The cavity size was normal. Wall thickness was normal. Systolic function was mildly reduced. The estimated ejection fraction was in the range of 45% to 50%.  Regional wall motion abnormalities:   There is akinesis of the apical myocardium. Doppler parameters are consistent with abnormal left ventricular relaxation (grade 1 diastolic dysfunction). Doppler parameters are consistent with high ventricular filling pressure.  ------------------------------------------------------------------- Aortic valve:   Trileaflet; moderately calcified leaflets. Valve mobility was restricted.  Doppler:   There was severe stenosis. There was no regurgitation.    VTI ratio of LVOT to aortic valve: 0.14. Valve area (VTI): 0.44 cm^2. Indexed valve area (VTI): 0.28 cm^2/m^2. Peak velocity ratio of LVOT to aortic valve: 0.13. Valve area (Vmax): 0.41 cm^2. Indexed valve area (Vmax): 0.26 cm^2/m^2. Mean velocity ratio of LVOT to aortic valve: 0.14. Valve area (Vmean): 0.43 cm^2.  Indexed valve area (Vmean): 0.27 cm^2/m^2. Mean gradient (S): 52 mm Hg. Peak gradient (S): 80 mm Hg.  ------------------------------------------------------------------- Aorta:  Aortic root: The aortic root was normal in size.  ------------------------------------------------------------------- Mitral valve:   Calcified annulus. Mobility was not restricted. Doppler:  Transvalvular velocity was within the normal range. There was no evidence for stenosis. There was trivial regurgitation. Peak gradient (D): 2 mm Hg.  ------------------------------------------------------------------- Left atrium:  The atrium was normal in size.  ------------------------------------------------------------------- Right ventricle:  The cavity size was normal. Systolic function was normal.  ------------------------------------------------------------------- Pulmonic valve:    Doppler:  Transvalvular velocity was within the normal range. There was no evidence for stenosis.  ------------------------------------------------------------------- Tricuspid valve:   Structurally normal valve.    Doppler: Transvalvular velocity was within the normal range. There was trivial regurgitation.  ------------------------------------------------------------------- Right atrium:  The atrium was normal in size.  ------------------------------------------------------------------- Pericardium:  There was no pericardial effusion.  ------------------------------------------------------------------- Systemic veins: Inferior vena cava: The vessel was normal in size.  ------------------------------------------------------------------- Measurements   Left ventricle                            Value          Reference  LV ID, ED, PLAX chordal                   50.9  mm       43 - 52  LV ID, ES, PLAX chordal                   34.9  mm       23 - 38  LV fx shortening, PLAX chordal            31    %        >=29   LV PW thickness, ED                       5.54  mm       ---------  IVS/LV PW ratio,  ED                       1.17           <=1.3  Stroke volume, 2D                         45    ml       ---------  Stroke volume/bsa, 2D                     28    ml/m^2   ---------  LV e&', lateral                            5     cm/s     ---------  LV E/e&', lateral                          14.16          ---------  LV e&', medial                             4.03  cm/s     ---------  LV E/e&', medial                           17.57          ---------  LV e&', average                            4.52  cm/s     ---------  LV E/e&', average                          15.68          ---------    Ventricular septum                        Value          Reference  IVS thickness, ED                         6.47  mm       ---------    LVOT                                      Value          Reference  LVOT ID, S                                20    mm       ---------  LVOT area                                 3.14  cm^2     ---------  LVOT ID  20    mm       ---------  LVOT peak velocity, S                     59    cm/s     ---------  LVOT mean velocity, S                     46.8  cm/s     ---------  LVOT VTI, S                               14.4  cm       ---------  Stroke volume (SV), LVOT DP               45.2  ml       ---------  Stroke index (SV/bsa), LVOT DP            28.1  ml/m^2   ---------    Aortic valve                              Value          Reference  Aortic valve peak velocity, S             447   cm/s     ---------  Aortic valve mean velocity, S             339   cm/s     ---------  Aortic valve VTI, S                       102   cm       ---------  Aortic mean gradient, S                   52    mm Hg    ---------  Aortic peak gradient, S                   80    mm Hg    ---------  VTI ratio, LVOT/AV                        0.14           ---------  Aortic  valve area, VTI                    0.44  cm^2     ---------  Aortic valve area/bsa, VTI                0.28  cm^2/m^2 ---------  Velocity ratio, peak, LVOT/AV             0.13           ---------  Aortic valve area, peak velocity          0.41  cm^2     ---------  Aortic valve area/bsa, peak               0.26  cm^2/m^2 ---------  velocity  Velocity ratio, mean, LVOT/AV             0.14           ---------  Aortic valve area, mean velocity          0.43  cm^2     ---------  Aortic valve area/bsa, mean               0.27  cm^2/m^2 ---------  velocity    Aorta                                     Value          Reference  Aortic root ID, ED                        26    mm       ---------  Ascending aorta ID, A-P, S                26    mm       ---------    Left atrium                               Value          Reference  LA ID, A-P, ES                            32    mm       ---------  LA ID/bsa, A-P                            1.99  cm/m^2   <=2.2  LA volume, S                              26.7  ml       ---------  LA volume/bsa, S                          16.6  ml/m^2   ---------  LA volume, ES, 1-p A4C                    23.8  ml       ---------  LA volume/bsa, ES, 1-p A4C                14.8  ml/m^2   ---------  LA volume, ES, 1-p A2C                    29.6  ml       ---------  LA volume/bsa, ES, 1-p A2C                18.4  ml/m^2   ---------    Mitral valve                              Value          Reference  Mitral E-wave peak velocity               70.8  cm/s     ---------  Mitral A-wave peak velocity               112   cm/s     ---------  Mitral deceleration time          (H)     261   ms  150 - 230  Mitral peak gradient, D                   2     mm Hg    ---------  Mitral E/A ratio, peak                    0.6            ---------    Right ventricle                           Value          Reference  RV ID, minor axis, ED, A4C base           21.3  mm        ---------  TAPSE                                     20.3  mm       ---------  RV s&', lateral, S                         12.5  cm/s     ---------  Legend: (L)  and  (H)  mark values outside specified reference range.  ------------------------------------------------------------------- Karie Georges 2018-05-02T15:35:08     Transthoracic Echocardiography  Patient:    Deanna, Wiater MR #:       [id number] Study Date: 11/05/2017 Gender:     F Age:        59 Height:     149.9 cm Weight:     58.6 kg BSA:        1.58 m^2 Pt. Status: Room:   SONOGRAPHER  Wyatt Mage, RDCS  ATTENDING    Sanda Klein, MD  [place], [place], Outpatient  REFERRING    [place], [place]  cc:  ------------------------------------------------------------------- LV EF: 40% -   45%  ------------------------------------------------------------------- Indications:      Aortic stenosis (I35).  ------------------------------------------------------------------- History:   PMH:   Dyspnea.  Coronary artery disease.  Aortic valve disease.  Chronic obstructive pulmonary disease.  Risk factors: PVD. Lung cancer. Tachycardia. Former tobacco use. Obese. Dyslipidemia.  ------------------------------------------------------------------- Study Conclusions  - Procedure narrative: Transthoracic echocardiography. Image   quality was adequate. Intravenous contrast (Definity) was   administered. - Left ventricle: The cavity size was at the upper limits of   normal. Systolic function was mildly to moderately reduced. The   estimated ejection fraction was in the range of 40% to 45%.   Akinesis and scarring of the apicalanteroseptal, anterior, and   apical myocardium; consistent with infarction in the distribution   of the left anterior descending coronary artery. Features are   consistent with a  pseudonormal left ventricular filling pattern,   with concomitant abnormal relaxation and increased filling   pressure (grade 2 diastolic dysfunction). Acoustic contrast   opacification revealed no evidence ofthrombus. - Aortic valve: There was severe stenosis. Mean gradient (S): 45 mm   Hg. Peak gradient (S): 74 mm Hg. - Mitral valve: Calcified annulus. There was mild to moderate   regurgitation directed centrally. - Left atrium: The atrium was mildly dilated. - Atrial septum: No defect or patent foramen ovale  was identified. - Pericardium, extracardiac: A trivial pericardial effusion was   identified.  ------------------------------------------------------------------- Labs, prior tests, procedures, and surgery: Transthoracic echocardiography (07/04/2016).    The aortic valve showed severe stenosis.  EF was 45%. Aortic valve: peak gradient of 80 mm Hg and mean gradient of 52 mm Hg.  ------------------------------------------------------------------- Study data:  Comparison was made to the study of 07/04/2016.  Study status:  Routine.  Procedure:  The patient reported no pain pre or post test. Transthoracic echocardiography. Image quality was adequate. Intravenous contrast (Definity) was administered.  Study completion:  There were no complications.          Transthoracic echocardiography.  M-mode, complete 2D, spectral Doppler, and color Doppler.  Birthdate:  Patient birthdate: Oct 16, 1950.  Age:  Patient is 68 yr old.  Sex:  Gender: female.    BMI: 26.1 kg/m^2.  Blood pressure:     160/98  Patient status:  Outpatient.  Study date: Study date: 11/05/2017. Study time: 12:59 PM.  Location:  [facility]  -------------------------------------------------------------------  ------------------------------------------------------------------- Left ventricle:  The cavity size was at the upper limits of normal. Systolic function was mildly to moderately reduced. The  estimated ejection fraction was in the range of 40% to 45%.  Acoustic contrast opacification revealed no evidence ofthrombus.  Regional wall motion abnormalities:   Akinesis and scarring of the apicalanteroseptal, anterior, and apical myocardium; consistent with infarction in the distribution of the left anterior descending coronary artery. Features are consistent with a pseudonormal left ventricular filling pattern, with concomitant abnormal relaxation and increased filling pressure (grade 2 diastolic dysfunction). There was no evidence of elevated ventricular filling pressure by Doppler parameters.  ------------------------------------------------------------------- Aortic valve:   Probably trileaflet; moderately thickened, moderately calcified leaflets.  Doppler:   There was severe stenosis.   There was no significant regurgitation.    VTI ratio of LVOT to aortic valve: 0.22. Valve area (VTI): 0.69 cm^2. Indexed valve area (VTI): 0.44 cm^2/m^2. Peak velocity ratio of LVOT to aortic valve: 0.22. Valve area (Vmax): 0.69 cm^2. Indexed valve area (Vmax): 0.44 cm^2/m^2. Mean velocity ratio of LVOT to aortic valve: 0.17. Valve area (Vmean): 0.53 cm^2. Indexed valve area (Vmean): 0.34 cm^2/m^2.    Mean gradient (S): 45 mm Hg. Peak gradient (S): 74 mm Hg.  ------------------------------------------------------------------- Mitral valve:   Calcified annulus. Leaflet separation was normal. Doppler:  Transvalvular velocity was within the normal range. There was no evidence for stenosis. There was mild to moderate regurgitation directed centrally.    Valve area by pressure half-time: 7.33 cm^2. Indexed valve area by pressure half-time: 4.64 cm^2/m^2.    Peak gradient (D): 3 mm Hg.  ------------------------------------------------------------------- Left atrium:  The atrium was mildly dilated.  ------------------------------------------------------------------- Atrial septum:  No defect  or patent foramen ovale was identified.   ------------------------------------------------------------------- Right ventricle:  The cavity size was normal. Systolic function was normal.  ------------------------------------------------------------------- Pulmonic valve:    Structurally normal valve.   Cusp separation was normal.  Doppler:  Transvalvular velocity was within the normal range. There was no regurgitation.  ------------------------------------------------------------------- Tricuspid valve:   Structurally normal valve.   Leaflet separation was normal.  Doppler:  Transvalvular velocity was within the normal range. There was no regurgitation.  ------------------------------------------------------------------- Right atrium:  The atrium was normal in size.  ------------------------------------------------------------------- Pericardium:  A trivial pericardial effusion was identified.  ------------------------------------------------------------------- Measurements   Left ventricle  Value          Reference  LV ID, ED, PLAX chordal           (H)     56    mm       43 - 52  LV ID, ES, PLAX chordal           (H)     50    mm       23 - 38  LV fx shortening, PLAX chordal    (L)     11    %        >=29  LV PW thickness, ED                       10    mm       ---------  IVS/LV PW ratio, ED                       0.8            <=1.3  Stroke volume, 2D                         46    ml       ---------  Stroke volume/bsa, 2D                     29    ml/m^2   ---------  LV e&', lateral                            5.82  cm/s     ---------  LV E/e&', lateral                          15.98          ---------  LV e&', medial                             3.3   cm/s     ---------  LV E/e&', medial                           28.18          ---------  LV e&', average                            4.56  cm/s     ---------  LV E/e&', average                           20.39          ---------    Ventricular septum                        Value          Reference  IVS thickness, ED                         8     mm       ---------    LVOT  Value          Reference  LVOT ID, S                                20    mm       ---------  LVOT area                                 3.14  cm^2     ---------  LVOT peak velocity, S                     95.82 cm/s     ---------  LVOT mean velocity, S                     51    cm/s     ---------  LVOT VTI, S                               18.76 cm       ---------  Stroke volume (SV), LVOT DP               58.9  ml       ---------  Stroke index (SV/bsa), LVOT DP            37.3  ml/m^2   ---------    Aortic valve                              Value          Reference  Aortic valve peak velocity, S             429   cm/s     ---------  Aortic valve mean velocity, S             301   cm/s     ---------  Aortic valve VTI, S                       93    cm       ---------  Aortic mean gradient, S                   45    mm Hg    ---------  Aortic peak gradient, S                   74    mm Hg    ---------  VTI ratio, LVOT/AV                        0.22           ---------  Aortic valve area, VTI                    0.69  cm^2     ---------  Aortic valve area/bsa, VTI                0.44  cm^2/m^2 ---------  Velocity ratio, peak, LVOT/AV             0.22           ---------  Aortic valve area, peak velocity  0.69  cm^2     ---------  Aortic valve area/bsa, peak               0.44  cm^2/m^2 ---------  velocity  Velocity ratio, mean, LVOT/AV             0.17           ---------  Aortic valve area, mean velocity          0.53  cm^2     ---------  Aortic valve area/bsa, mean               0.34  cm^2/m^2 ---------  velocity    Aorta                                     Value          Reference  Aortic root ID, ED                        30    mm       ---------  Ascending aorta ID,  A-P, S                30    mm       ---------    Left atrium                               Value          Reference  LA ID, A-P, ES                            40    mm       ---------  LA ID/bsa, A-P                    (H)     2.53  cm/m^2   <=2.2  LA volume, S                              51.3  ml       ---------  LA volume/bsa, S                          32.5  ml/m^2   ---------  LA volume, ES, 1-p A4C                    43.2  ml       ---------  LA volume/bsa, ES, 1-p A4C                27.4  ml/m^2   ---------  LA volume, ES, 1-p A2C                    51.3  ml       ---------  LA volume/bsa, ES, 1-p A2C                32.5  ml/m^2   ---------    Mitral valve                              Value  Reference  Mitral E-wave peak velocity               93    cm/s     ---------  Mitral A-wave peak velocity               114   cm/s     ---------  Mitral deceleration time          (L)     103   ms       150 - 230  Mitral pressure half-time                 30    ms       ---------  Mitral peak gradient, D                   3     mm Hg    ---------  Mitral E/A ratio, peak                    0.8            ---------  Mitral valve area, PHT, DP                7.33  cm^2     ---------  Mitral valve area/bsa, PHT, DP            4.64  cm^2/m^2 ---------    Right ventricle                           Value          Reference  TAPSE                                     18.4  mm       ---------  RV s&', lateral, S                         11.6  cm/s     ---------  Legend: (L)  and  (H)  mark values outside specified reference range.  ------------------------------------------------------------------- Prepared and Electronically Authenticated by  Sanda Klein, MD 2019-09-03T16:45:28       ECHOCARDIOGRAM REPORT       Patient Name:   Rivka Barbara Date of Exam: 08/12/2018 Medical Rec #:  [id number]      Height:       59.5 in Accession #:    [id number]     Weight:       103.4  lb Date of Birth:  Aug 07, 1950     BSA:          1.40 m Patient Age:    52 years       BP:           108/68 mmHg Patient Gender: F              HR:           99 bpm. Exam Location:  [address]    Procedure: 2D Echo and 3D Echo  Indications:    I50.9 CHF                 R06.09 Dyspnea   History:        Patient has prior history of Echocardiogram examinations, most  recent 11/05/2017. CHF CAD and Previous Myocardial Infarction COPD                 Aortic Valve Disease Signs/Symptoms: Dyspnea and Shortness of                 Breath Risk Factors: Former Smoker. Tachycardia, Ischemic                 Cardiomyopathy, Edema.   Sonographer:    Deliah Boston RDCS Referring Phys: Agar    1. Dyskinesis of the left ventricular apex. Hypokinesis of the anterior wall and anterior septum.  2. The left ventricle has severely reduced systolic function, with an ejection fraction of 25-30%. The cavity size was moderately dilated. Left ventricular diastolic Doppler parameters are consistent with pseudonormalization. Elevated mean left atrial  pressure Left ventricular diffuse hypokinesis.  3. The right ventricle has mildly reduced systolic function. The cavity was normal. There is moderately increased right ventricular wall thickness. Right ventricular systolic pressure is moderately elevated with an estimated pressure of 55.3 mmHg.  4. Left atrial size was mild-moderately dilated.  5. Right atrial size was mildly dilated.  6. Mild thickening of the mitral valve leaflet. There is moderate mitral annular calcification present. Mitral valve regurgitation is severe by color flow Doppler. The MR jet is eccentric laterally directed. No evidence of mitral valve stenosis.  7. The aortic valve is tricuspid. Severely thickening of the aortic valve. Severe calcifcation of the aortic valve. Aortic valve regurgitation is trivial by color flow Doppler.  8. Severe  aortic stenosis: mean gradient 47 mm Hg, calculated AV area 0.35 cm sq.  9. Tricuspid valve regurgitation is mild-moderate. 10. The aortic root and ascending aorta are normal in size and structure. 11. When compared to the prior study: Compared to September 2019, aortic stenosis has worsened, the left ventricle is now dilated and severely depressed and there is marked worsening of mitral regurgitation, which is now severe.  SUMMARY   Discussed with Dr. Casandra Doffing, MD and Richardson Dopp, [facility].  FINDINGS  Left Ventricle: The left ventricle has severely reduced systolic function, with an ejection fraction of 25-30%. The cavity size was moderately dilated. There is no increase in left ventricular wall thickness. Left ventricular diastolic Doppler  parameters are consistent with pseudonormalization. Elevated mean left atrial pressure Left ventricular diffuse hypokinesis. Mild dyskinesis of the left ventricular, entire apical segment.  Right Ventricle: The right ventricle has mildly reduced systolic function. The cavity was normal. There is moderately increased right ventricular wall thickness. Right ventricular systolic pressure is moderately elevated with an estimated pressure of  55.3 mmHg.  Left Atrium: Left atrial size was mild-moderately dilated.  Right Atrium: Right atrial size was mildly dilated. Right atrial pressure is estimated at 15 mmHg.  Interatrial Septum: No atrial level shunt detected by color flow Doppler.  Pericardium: There is no evidence of pericardial effusion.  Mitral Valve: The mitral valve is normal in structure. Mild thickening of the mitral valve leaflet. There is moderate mitral annular calcification present. Mitral valve regurgitation is severe by color flow Doppler. The MR jet is eccentric laterally  directed. No evidence of mitral valve stenosis.  Tricuspid Valve: The tricuspid valve is normal in structure. Tricuspid valve regurgitation is mild-moderate by  color flow Doppler.  Aortic Valve: The aortic valve is tricuspid Severely thickening of the aortic valve. Severe calcifcation of the aortic valve. Aortic valve regurgitation is trivial by color flow Doppler.  Pulmonic  Valve: The pulmonic valve was grossly normal. Pulmonic valve regurgitation is not visualized by color flow Doppler.  Aorta: The aortic root and ascending aorta are normal in size and structure.  Compared to previous exam: Compared to September 2019, aortic stenosis has worsened, the left ventricle is now dilated and severely depressed and there is marked worsening of mitral regurgitation, which is now severe.    +--------------+--------++  LEFT VENTRICLE            +----------------+---------++ +--------------+--------++  Diastology                    PLAX 2D                   +----------------+---------++ +--------------+--------++  LV e' lateral:   4.57 cm/s    LVIDd:         6.44 cm    +----------------+---------++ +--------------+--------++  LV E/e' lateral: 35.7         LVIDs:         5.62 cm    +----------------+---------++ +--------------+--------++  LV e' medial:    4.03 cm/s    LV PW:         0.64 cm    +----------------+---------++ +--------------+--------++  LV E/e' medial:  40.4         LV IVS:        0.76 cm    +----------------+---------++ +--------------+--------++  LVOT diam:     2.00 cm    +--------------+--------++  LV SV:         57 ml      +--------------+--------++  LV SV Index:   40.27      +--------------+--------++  LVOT Area:     3.14 cm   +--------------+--------++                            +--------------+--------++  +---------------+---------++  RIGHT VENTRICLE             +---------------+---------++  RV S prime:     9.68 cm/s   +---------------+---------++  TAPSE (M-mode): 1.4 cm      +---------------+---------++  RVSP:           55.3 mmHg   +---------------+---------++  +---------------+-------++-----------++  LEFT  ATRIUM              Index         +---------------+-------++-----------++  LA diam:        4.00 cm  2.85 cm/m    +---------------+-------++-----------++  LA Vol (A2C):   86.4 ml  61.65 ml/m   +---------------+-------++-----------++  LA Vol (A4C):   70.2 ml  50.09 ml/m   +---------------+-------++-----------++  LA Biplane Vol: 78.3 ml  55.87 ml/m   +---------------+-------++-----------++ +------------+----------++-----------++  RIGHT ATRIUM             Index         +------------+----------++-----------++  RA Pressure: 15.00 mmHg                +------------+----------++-----------++  RA Area:     17.40 cm                 +------------+----------++-----------++  RA Volume:   49.80 ml    35.53 ml/m   +------------+----------++-----------++  +------------------+------------++  AORTIC VALVE                      +------------------+------------++  AV Area (Vmax):    0.35 cm       +------------------+------------++  AV Area (Vmean):   0.32 cm       +------------------+------------++  AV Area (VTI):     0.36 cm       +------------------+------------++  AV Vmax:           481.99 cm/s    +------------------+------------++  AV Vmean:          305.428 cm/s   +------------------+------------++  AV VTI:            0.832 m        +------------------+------------++  AV Peak Grad:      92.9 mmHg      +------------------+------------++  AV Mean Grad:      47.3 mmHg      +------------------+------------++  LVOT Vmax:         53.90 cm/s     +------------------+------------++  LVOT Vmean:        30.900 cm/s    +------------------+------------++  LVOT VTI:          0.096 m        +------------------+------------++  LVOT/AV VTI ratio: 0.11           +------------------+------------++  AR PHT:            285 msec       +------------------+------------++   +-------------+-------++  AORTA                   +-------------+-------++  Ao Root diam: 2.70  cm   +-------------+-------++  Ao Asc diam:  3.00 cm   +-------------+-------++  +--------------+---------++    +---------------+-----------++  MITRAL VALVE                   TRICUSPID VALVE               +--------------+---------++    +---------------+-----------++  MV Area (PHT): 3.19 cm        TR Peak grad:   40.3 mmHg     +--------------+---------++    +---------------+-----------++  MV Peak grad:  9.7 mmHg        TR Vmax:        319.00 cm/s   +--------------+---------++    +---------------+-----------++  MV Mean grad:  3.0 mmHg        Estimated RAP:  15.00 mmHg    +--------------+---------++    +---------------+-----------++  MV Vmax:       1.56 m/s        RVSP:           55.3 mmHg     +--------------+---------++    +---------------+-----------++  MV Vmean:      82.6 cm/s   +--------------+---------++    +--------------+-------+  MV VTI:        0.35 m          SHUNTS                  +--------------+---------++    +--------------+-------+  MV PHT:        69 msec         Systemic VTI:  0.10 m   +--------------+---------++    +--------------+-------+  MV Decel Time: 162 msec        Systemic Diam: 2.00 cm  +--------------+---------++    +--------------+-------+ +---------------+-----------++  MR Peak grad:   166.9 mmHg    +---------------+-----------++  MR Mean grad:   93.5 mmHg     +---------------+-----------++  MR Vmax:        646.00 cm/s   +---------------+-----------++  MR Vmean:  436.0 cm/s    +---------------+-----------++  MR PISA:        0.57 cm      +---------------+-----------++  MR PISA Radius: 0.30 cm       +---------------+-----------++ +--------------+-----------++  MV E velocity: 163.00 cm/s   +--------------+-----------++  MV A velocity: 96.70 cm/s    +--------------+-----------++  MV E/A ratio:  1.69          +--------------+-----------++    Sanda Klein MD Electronically signed by Sanda Klein MD Signature Date/Time: 08/12/2018/2:19:01  PM      LEFT HEART CATH AND CORONARY ANGIOGRAPHY  RIGHT HEART CATH  Conclusion    Previously placed Mid RCA to Dist RCA stent (unknown type) is widely patent.  Prox RCA lesion is 50% stenosed.  Previously placed Ost Cx to Mid Cx stent (unknown type) is widely patent.  Previously placed Ost LM to Mid LAD stent (unknown type) is widely patent.  Dist Cx lesion is 30% stenosed.  3rd Mrg lesion is 100% stenosed.   1. Triple vessel CAD.  2. Patent stents in the left main, LAD and Circumflex (placed at the time of dissection) 3. Chronic occlusion small Obtuse marginal branch which fills from right to left collaterals 4. Patent mid to distal RCA stent. The proximal RCA has a moderate non-obstructive stenosis which does not appear to be flow limiting. This is unchanged from her last cath.  5. Severe aortic stenosis (mean gradient 21 mmHg, peak to peak gradient 36 mmHg, AVA 0.71 cm2) 6. Elevated filling pressures c/w acute volume overload (RA 9/14, RV 57/11/12, PA 58/27 mean 39, PCWP 33)  Recommendations: Medical management of CAD. I think the proximal RCA stenosis is unchanged from her last cath and does not appear to be flow limiting. She has severe AS. Will begin workup for TAVR. I suspect she will be a TAVR candidate although she would be a surgical candidate as well if we leaned toward open surgical  AVR. She is volume overloaded today. Will admit for diuresis with IV Lasix.    Indications  Severe aortic stenosis [I35.0 (ICD-10-CM)]  Procedural Details  Technical Details Indication: 68 yo female with history of CAD, severe AS, severe MR, new LV systolic dysfunction  Procedure: The risks, benefits, complications, treatment options, and expected outcomes were discussed with the patient. The patient and/or family concurred with the proposed plan, giving informed consent. The patient was brought to the cath lab after IV hydration was given. The patient was sedated with Versed and  Fentanyl. The IV catheter in the right antecubital vein was prepped and draped in a sterile fashion. This was changed for a 5 [place] sheath. Right heart catheterization performed with a balloon tipped catheter. The right wrist was prepped and draped in a sterile fashion. 1% lidocaine was used for local anesthesia. Using the modified Seldinger access technique, a 5 French sheath was placed in the right radial artery. 3 mg Verapamil was given through the sheath. 3500 units IV heparin was given. Standard diagnostic catheters were used to perform selective coronary angiography. I crossed the aortic valve with an AL-1 catheter and a straight wire. The sheath was removed from the right radial artery and a Terumo hemostasis band was applied at the arteriotomy site on the right wrist.   Estimated blood loss <50 mL.   During this procedure medications were administered to achieve and maintain moderate conscious sedation while the patient's heart rate, blood pressure, and oxygen saturation were continuously monitored and I was present face-to-face 100% of  this time.  Medications (Filter: Administrations occurring from 08/19/18 0730 to 08/19/18 0900) (important)  Continuous medications are totaled by the amount administered until 08/19/18 0900.  Medication Rate/Dose/Volume Action  Date Time   fentaNYL (SUBLIMAZE) injection (mcg) 25 mcg Given 08/19/18 0756   Total dose as of 08/19/18 0900        25 mcg        midazolam (VERSED) injection (mg) 1 mg Given 08/19/18 0756   Total dose as of 08/19/18 0900        1 mg        Heparin (Porcine) in NaCl 1000-0.9 UT/500ML-% SOLN (mL) 500 mL Given 08/19/18 0800   Total dose as of 08/19/18 0900 500 mL Given 0801   1,000 mL        lidocaine (PF) (XYLOCAINE) 1 % injection (mL) 5 mL Given 08/19/18 0806   Total dose as of 08/19/18 0900 2 mL Given 0807   7 mL        Radial Cocktail/Verapamil only (mL) 10 mL Given 08/19/18 0808   Total dose as of 08/19/18 0900        10 mL         heparin injection (Units) 3,500 Units Given 08/19/18 0819   Total dose as of 08/19/18 0900        3,500 Units        iohexol (OMNIPAQUE) 350 MG/ML injection (mL) 60 mL Given 08/19/18 0839   Total dose as of 08/19/18 0900        60 mL        Sedation Time  Sedation Time Physician-1: 35 minutes 54 seconds  Complications  Complications documented before study signed (08/20/2018 8:05 AM)   LEFT HEART CATH AND CORONARY ANGIOGRAPHY  None Documented by Burnell Blanks, MD 08/19/2018 8:46 AM  Date Found: 08/19/2018  Time Range: Intraprocedure      Coronary Findings  Diagnostic Dominance: Right Left Main  Ost LM to Mid LAD lesion 0% stenosed  Previously placed Ost LM to Mid LAD stent (unknown type) is widely patent.  Left Anterior Descending  Vessel is large.  Left Circumflex  Vessel is large.  Ost Cx to Mid Cx lesion 0% stenosed  Previously placed Ost Cx to Mid Cx stent (unknown type) is widely patent.  Dist Cx lesion 30% stenosed  Dist Cx lesion is 30% stenosed.  Third Obtuse Marginal Branch  Collaterals  3rd Mrg filled by collaterals from 3rd RPL.    3rd Mrg lesion 100% stenosed  3rd Mrg lesion is 100% stenosed. The lesion is chronically occluded.  Right Coronary Artery  Vessel is large.  Prox RCA lesion 50% stenosed  Prox RCA lesion is 50% stenosed.  Mid RCA to Dist RCA lesion 0% stenosed  Previously placed Mid RCA to Dist RCA stent (unknown type) is widely patent.  Intervention  No interventions have been documented. Coronary Diagrams  Diagnostic Dominance: Right  Intervention  Implants   No implant documentation for this case.  Syngo Images  Show images for CARDIAC CATHETERIZATION  Images on Long Term Storage  Show images for Livie, [place] to Procedure Log  Procedure Log    Hemo Data   Most Recent Value  Fick Cardiac Output 3.16 L/min  Fick Cardiac Output Index 2.28 (L/min)/BSA  Aortic Mean Gradient 21.02 mmHg  Aortic  Peak Gradient 36 mmHg  Aortic Valve Area 0.71  Aortic Value Area Index 0.51 cm2/BSA  RA A Wave 9 mmHg  RA V Wave 14 mmHg  RA Mean 12 mmHg  RV Systolic Pressure 57 mmHg  RV Diastolic Pressure 11 mmHg  RV EDP 15 mmHg  PA Systolic Pressure 56 mmHg  PA Diastolic Pressure 27 mmHg  PA Mean 39 mmHg  PW A Wave 26 mmHg  PW V Wave 49 mmHg  PW Mean 33 mmHg  AO Systolic Pressure 299 mmHg  AO Diastolic Pressure 69 mmHg  AO Mean 86 mmHg  LV Systolic Pressure 371 mmHg  LV Diastolic Pressure 20 mmHg  LV EDP 27 mmHg  AOp Systolic Pressure 696 mmHg  AOp Diastolic Pressure 66 mmHg  AOp Mean Pressure 86 mmHg  LVp Systolic Pressure 789 mmHg  LVp Diastolic Pressure 20 mmHg  LVp EDP Pressure 27 mmHg  QP/QS 1  TPVR Index 17.1 HRUI  TSVR Index 37.71 HRUI  PVR SVR Ratio 0.08  TPVR/TSVR Ratio 0.45    EKG: NSR w/out acute ischemic changes and no significant AV conduction delay    STS Risk Calculator  Procedure: Isolated AVR Risk of Mortality: 12.998% Renal Failure: 3.312% Permanent Stroke: 0.972% Prolonged Ventilation: 40.420% DSW Infection: 0.100% Reoperation: 7.064% Morbidity or Mortality: 43.196% Short Length of Stay: 11.528% Long Length of Stay: 25.115%    Impression:  Patient has stage D severe symptomatic aortic stenosis and severe mitral regurgitation.  She presents with of acute exacerbation of chronic combined systolic and diastolic congestive heart failure, [place] Heart Association functional class IV.  Findings at the time of catheterization revealed moderate to severely elevated filling pressures with pulmonary edema.  She has clinically improved with intravenous diuretic therapy.  I have personally reviewed the patient's recent transthoracic echocardiograms and diagnostic cardiac catheterization.  The patient has had echocardiography findings consistent with severe aortic stenosis for the past 3 years.  There is severe thickening, calcification, and restricted  leaflet mobility involving all 3 leaflets of the patient's aortic valve.  Peak velocity across the aortic valve has consistently measured well above 4.5 m/s with previous echocardiograms revealing mean transvalvular gradients greater than 40 mmHg.  Left ventricular systolic function has decreased substantially over the past year with ejection fraction now estimated only 25 to 30% and significant left ventricular chamber enlargement.  Despite this mean transvalvular gradient across the aortic valve remains elevated and estimated 47 mmHg.  There is now severe mitral regurgitation whereas previous echocardiograms revealed only mild to moderate mitral regurgitation.  I suspect this may be secondary mitral regurgitation which has deteriorated with the patient's recent acute exacerbation of heart failure, although functional anatomy of the mitral valve is not clearly elucidated.  Diagnostic cardiac catheterization revealed continued patency of the stents placed in all 3 major coronary artery territories with perhaps 50% eccentric stenosis of the proximal right coronary artery and no other significant flow-limiting lesions.  There was moderate to severe pulmonary hypertension and elevating filling pressures although cardiac output remains normal.  Risks associated with conventional surgical aortic valve replacement with or without concomitant mitral valve repair or replacement and/or coronary artery bypass grafting would unquestionably be high in this patient with severe decompensated heart failure, significant left ventricular systolic dysfunction, and underlying severe COPD.     Plan:  I recommend transesophageal echocardiogram once the patient's respiratory status has been optimized to further assess the functional anatomy and severity of the patient's mitral regurgitation.  CT angiography should be performed to evaluate the size of the aortic root and whether or not transcatheter aortic valve replacement might  be a feasible alternative to  high risk surgical intervention.  I discussed matters at length with the patient at the bedside today.  We will continue to follow along while she remains in the hospital.  All questions answered.   I spent in excess of 120 minutes during the conduct of this hospital consultation and >50% of this time involved direct face-to-face encounter for counseling and/or coordination of the patient's care.    Valentina Gu. Roxy Manns, MD 08/21/2018 9:25 AM

## 2018-08-21 NOTE — Progress Notes (Signed)
Progress Note  Patient Name: Teresa [name] Date of Encounter: 08/21/2018  Primary Cardiologist: Larae Grooms, MD   Subjective   Breathing is better  No CP    Inpatient Medications    Scheduled Meds: . clopidogrel  75 mg Oral Daily  . fluticasone furoate-vilanterol  1 puff Inhalation Daily  . furosemide  80 mg Intravenous Q12H  . potassium chloride SA  20 mEq Oral BID  . sodium chloride flush  3 mL Intravenous Q12H  . umeclidinium bromide  1 puff Inhalation Daily   Continuous Infusions: . sodium chloride     PRN Meds: sodium chloride, acetaminophen, albuterol, nitroGLYCERIN, ondansetron (ZOFRAN) IV, sodium chloride flush   Vital Signs    Vitals:   08/20/18 1147 08/20/18 2006 08/21/18 0315 08/21/18 0407  BP: 108/62 106/66  115/73  Pulse: 96 81  78  Resp: 20 18  19   Temp: 97.7 F (36.5 C) 97.7 F (36.5 C)  98.6 F (37 C)  TempSrc: Oral Oral  Oral  SpO2: 97% 99%  98%  Weight:   44 kg   Height:        Intake/Output Summary (Last 24 hours) at 08/21/2018 0933 Last data filed at 08/21/2018 0407 Gross per 24 hour  Intake 720 ml  Output 2550 ml  Net -1830 ml    Net neg 3.4 L   Filed Weights   08/19/18 1231 08/20/18 0438 08/21/18 0315  Weight: 47 kg 45.5 kg 44 kg    Telemetry    SR - Personally Reviewed  Physical Exam   Physical exam per MD:  GEN: No acute distress.   Neck: No JVD, no carotid bruits Cardiac:  RRR,  Soft late peaking systolic murmur LSB/Base  No  rubs, or gallops.  Respiratory: Clear to auscultation bilaterally, no wheezes/ rales/ rhonchi GI: NABS, Soft, nontender, non-distended  MS: No edema; No deformity. Neuro:  Nonfocal, moving all extremities spontaneously Psych: Normal affect   Labs    Chemistry Recent Labs  Lab 08/18/18 1115 08/19/18 0819 08/20/18 0906  NA 139 142  135 138  K 3.6 3.0*  3.6 3.2*  CL 91*  --  93*  CO2 30*  --  35*  GLUCOSE 133*  --  133*  BUN 24  --  18  CREATININE 0.85  --  0.59  CALCIUM  9.5  --  9.1  GFRNONAA 72  --  >60  GFRAA 83  --  >60  ANIONGAP  --   --  10     Hematology Recent Labs  Lab 08/19/18 0819 08/20/18 0906  WBC  --  8.8  RBC  --  4.26  HGB 10.9*  12.6 11.5*  HCT 32.0*  37.0 37.8  MCV  --  88.7  MCH  --  27.0  MCHC  --  30.4  RDW  --  14.7  PLT  --  287    Cardiac EnzymesNo results for input(s): TROPONINI in the last 168 hours. No results for input(s): TROPIPOC in the last 168 hours.   BNPNo results for input(s): BNP, PROBNP in the last 168 hours.   DDimer No results for input(s): DDIMER in the last 168 hours.   Radiology    No results found.  Cardiac Studies   Left heart catheterization 08/19/2018:  Previously placed Mid RCA to Dist RCA stent (unknown type) is widely patent.  Prox RCA lesion is 50% stenosed.  Previously placed Ost Cx to Mid Cx stent (unknown type) is widely patent.  Previously placed Ost LM to Mid LAD stent (unknown type) is widely patent.  Dist Cx lesion is 30% stenosed.  3rd Mrg lesion is 100% stenosed.  1. Triple vessel CAD.  2. Patent stents in the left main, LAD and Circumflex (placed at the time of dissection) 3. Chronic occlusion small Obtuse marginal branch which fills from right to left collaterals 4. Patent mid to distal RCA stent. The proximal RCA has a moderate non-obstructive stenosis which does not appear to be flow limiting. This is unchanged from her last cath.  5. Severe aortic stenosis (mean gradient 21 mmHg, peak to peak gradient 36 mmHg, AVA 0.71 cm2) 6. Elevated filling pressures c/w acute volume overload (RA 9/14, RV 57/11/12, PA 58/27 mean 39, PCWP 33)  Recommendations: Medical management of CAD. I think the proximal RCA stenosis is unchanged from her last cath and does not appear to be flow limiting. She has severe AS. Will begin workup for TAVR. I suspect she will be a TAVR candidate although she would be a surgical candidate as well if we leaned toward open surgical  AVR. She is  volume overloaded today. Will admit for diuresis with IV Lasix.   Echocardiogram 08/20/2018: IMPRESSIONS   1. Dyskinesis of the left ventricular apex. Hypokinesis of the anterior wall and anterior septum. 2. The left ventricle has severely reduced systolic function, with an ejection fraction of 25-30%. The cavity size was moderately dilated. Left ventricular diastolic Doppler parameters are consistent with pseudonormalization. Elevated mean left atrial  pressure Left ventricular diffuse hypokinesis. 3. The right ventricle has mildly reduced systolic function. The cavity was normal. There is moderately increased right ventricular wall thickness. Right ventricular systolic pressure is moderately elevated with an estimated pressure of 55.3 mmHg. 4. Left atrial size was mild-moderately dilated. 5. Right atrial size was mildly dilated. 6. Mild thickening of the mitral valve leaflet. There is moderate mitral annular calcification present. Mitral valve regurgitation is severe by color flow Doppler. The MR jet is eccentric laterally directed. No evidence of mitral valve stenosis. 7. The aortic valve is tricuspid. Severely thickening of the aortic valve. Severe calcifcation of the aortic valve. Aortic valve regurgitation is trivial by color flow Doppler. 8. Severe aortic stenosis: mean gradient 47 mm Hg, calculated AV area 0.35 cm sq. 9. Tricuspid valve regurgitation is mild-moderate. 10. The aortic root and ascending aorta are normal in size and structure. 11. When compared to the prior study: Compared to September 2019, aortic stenosis has worsened, the left ventricle is now dilated and severely depressed and there is marked worsening of mitral regurgitation, which is now severe.   Patient Profile     68 y.o.femalewith history of CAD (prior stenting of the LM, LAD and circumflex in the setting of dissection), prior RCA stenting, AAA, chronic bronchitis, chronic systolic and diastolic  CHF, GERD, HLD, PAD, aortic stenosis and mitral regurgitation, ischemic cardiomyopathy and tobacco abuse who is being admitted post cath for volume overload and diuresis.  Assessment & Plan    1. Acute on chronic combined CHFL noted to be volume overloaded on [facility] 08/19/2018 ad admitted for diuresis. Echo 08/12/2018 with EF 25-30%. Diuresing with IV lasix with UOP net -2.0L in the past 24 hours and -2.8L this admission. Weight steadily decreasing 103lbs>100lbs>97lbs today. Cr stable today. Diltiazem discontinued this admission given low EF.  - Continue IV lasix Check BMET and BNP   - Consider starting metoprolol succinate 12.5mg  daily  -  2. Severe aortic stenosis: noted on both recent  echo and [facility] 08/19/2018. TEE scheduled for tomorrow per structural heart team. The risks and benefits of transesophageal echocardiogram have been explained including risks of esophageal damage, perforation (1:10,000 risk), bleeding, pharyngeal hematoma as well as other potential complications associated with conscious sedation including aspiration, arrhythmia, respiratory failure and death. Alternatives to treatment were discussed, questions were answered. Patient is willing to proceed.  - NPO after MN for TEE 08/22/2018 at 10:45am with Dr. Debara Pickett  3. Severe mitral regurgitation: noted on recent echo. TEE scheduled tomorrow to evaluate AS  Reviewed with C Roxy Manns   May be functional MR due to LV dilitation in setting of AS  / CHF.   4. CAD s/p PCI to LM, LAD, and LCx in the settin gof dissection: [facility] 08/19/2018 with stable multivessel CAD with patent stents. Not on ASA. Intolerant to statins. - Continue plavix - Continue praluent   5  HL   Continue on Praluent   6  COPD  SIgnificant     For questions or updates, please contact [facility] Please consult [email or web address] for contact info under Cardiology/STEMI.      Signed, Abigail Butts, PA-C  08/21/2018, 9:33 AM   [phone]  Pt seen and examined  I  agree with findings as documented by Teodoro Kil above.Assessment made by Teodoro Kil virtually   I have placed/amended physical exam and assessment  Plans as noted for TEE tomorrow to asses AV and MV   Continue diuresis Check BMET today and in am.  Dorris Carnes MD

## 2018-08-22 ENCOUNTER — Inpatient Hospital Stay (HOSPITAL_COMMUNITY): Payer: PPO

## 2018-08-22 ENCOUNTER — Encounter (HOSPITAL_COMMUNITY)
Admission: AD | Disposition: A | Discharge status: Home / Self Care | Payer: Self-pay | Source: Home / Self Care | Attending: Cardiovascular Disease

## 2018-08-22 ENCOUNTER — Inpatient Hospital Stay (HOSPITAL_COMMUNITY): Payer: PPO | Admitting: Certified Registered"

## 2018-08-22 ENCOUNTER — Encounter (HOSPITAL_COMMUNITY): Payer: Self-pay

## 2018-08-22 DIAGNOSIS — I350 Nonrheumatic aortic (valve) stenosis: Secondary | ICD-10-CM

## 2018-08-22 HISTORY — PX: TEE WITHOUT CARDIOVERSION: SHX5443

## 2018-08-22 LAB — BASIC METABOLIC PANEL
Anion gap: 7 (ref 5–15)
BUN: 17 mg/dL (ref 8–23)
CO2: 37 mmol/L — ABNORMAL HIGH (ref 22–32)
Calcium: 8.8 mg/dL — ABNORMAL LOW (ref 8.9–10.3)
Chloride: 94 mmol/L — ABNORMAL LOW (ref 98–111)
Creatinine, Ser: 0.67 mg/dL (ref 0.44–1.00)
GFR calc Af Amer: >60 mL/min (ref >60)
GFR calc non Af Amer: >60 mL/min (ref >60)
Glucose, Bld: 116 mg/dL — ABNORMAL HIGH (ref 70–99)
Potassium: 4 mmol/L (ref 3.5–5.1)
Sodium: 138 mmol/L (ref 135–145)

## 2018-08-22 SURGERY — ECHOCARDIOGRAM, TRANSESOPHAGEAL
Anesthesia: Monitor Anesthesia Care

## 2018-08-22 MED ORDER — PHENYLEPHRINE 40 MCG/ML (10ML) SYRINGE FOR IV PUSH (FOR BLOOD PRESSURE SUPPORT)
PREFILLED_SYRINGE | INTRAVENOUS | Status: DC | PRN
  Administered 2018-08-22: 80 ug via INTRAVENOUS

## 2018-08-22 MED ORDER — PROPOFOL 500 MG/50ML IV EMUL
INTRAVENOUS | Status: DC | PRN
  Administered 2018-08-22: 75 ug/kg/min via INTRAVENOUS

## 2018-08-22 MED ORDER — SODIUM CHLORIDE 0.9% FLUSH
10.0000 mL | Freq: Two times a day (BID) | INTRAVENOUS | Status: DC
  Administered 2018-08-22 – 2018-08-25 (×6): 10 mL

## 2018-08-22 MED ORDER — SODIUM CHLORIDE 0.9% FLUSH: 10.0000 mL | INTRAVENOUS | Status: DC | PRN

## 2018-08-22 MED ORDER — BUTAMBEN-TETRACAINE-BENZOCAINE 2-2-14 % EX AERO
INHALATION_SPRAY | CUTANEOUS | Status: DC | PRN
  Administered 2018-08-22: 2 via TOPICAL

## 2018-08-22 MED ORDER — SODIUM CHLORIDE 0.9 % IV SOLN: INTRAVENOUS | Status: DC

## 2018-08-22 MED ORDER — LIDOCAINE 2% (20 MG/ML) 5 ML SYRINGE
INTRAMUSCULAR | Status: DC | PRN
  Administered 2018-08-22: 60 mg via INTRAVENOUS

## 2018-08-22 MED ORDER — PROPOFOL 10 MG/ML IV BOLUS
INTRAVENOUS | Status: DC | PRN
  Administered 2018-08-22 (×2): 25 mg via INTRAVENOUS

## 2018-08-22 NOTE — Progress Notes (Addendum)
[facility] [address]       [place] [address]             [phone]     CARDIOTHORACIC SURGERY PROGRESS NOTE  Day of Surgery  S/P Procedure(s) (LRB): TRANSESOPHAGEAL ECHOCARDIOGRAM (TEE) (N/A)  Subjective: Reports feeling better.  Breathing much improved  Objective: Vital signs in last 24 hours: Temp:  [97.5 F (36.4 C)-98 F (36.7 C)] 97.7 F (36.5 C) (06/19 1156) Pulse Rate:  [99-114] 109 (06/19 1156) Cardiac Rhythm: Normal sinus rhythm (06/19 0700) Resp:  [13-26] 18 (06/19 1156) BP: (102-131)/(64-109) 109/81 (06/19 1156) SpO2:  [98 %-100 %] 98 % (06/19 1156) Weight:  [43.8 kg] 43.8 kg (06/19 0346)  Physical Exam:  Rhythm:   sinus  Breath sounds: Few bibasilar crackles  Heart sounds:  RRR w/ systolic murmur  Incisions:  n/a  Abdomen:  soft  Extremities:  Warm, no edema   Intake/Output from previous day: 06/18 0701 - 06/19 0700 In: 483 [P.O.:480; I.V.:3] Out: 1702 [Urine:1700; Stool:2] Intake/Output this shift: Total I/O In: 242 [P.O.:222; I.V.:20] Out: 725 [Urine:725]  Lab Results: Recent Labs    08/20/18 0906  WBC 8.8  HGB 11.5*  HCT 37.8  PLT 287   BMET:  Recent Labs    08/20/18 0906 08/22/18 0549  NA 138 138  K 3.2* 4.0  CL 93* 94*  CO2 35* 37*  GLUCOSE 133* 116*  BUN 18 17  CREATININE 0.59 0.67  CALCIUM 9.1 8.8*    CBG (last 3)  No results for input(s): GLUCAP in the last 72 hours. PT/INR:  No results for input(s): LABPROT, INR in the last 72 hours.  CXR:  N/A  Procedure: Transesophageal Echo, 3D Echo, Cardiac Doppler and Color Doppler  Indications:     I35.0 Nonrheumatic aortic (valve) stenosis. Mitral                  regurgitation.   History:         Patient has prior history of Echocardiogram examinations, most                  recent 08/12/2018. CHF CAD Abnormal ECG Mitral Valve Disease and                  Aortic Valve Disease Risk Factors: Current Smoker. Severe                  mitral regurgitation. Aortic  stenosis.   Sonographer:     Roseanna Rainbow Referring Phys:  [id number] Woodfin Ganja THOMPSON Diagnosing Phys: Lyman Bishop MD    Sonographer Comments: Transgastric views could not be obtained due to resistance.      PROCEDURE: Local oropharyngeal anesthetic was provided with Benzocaine spray. Patients was monitored while under deep sedation Anesthestetic sedation was provided intravenously by Anesthesiology: 154m of Propofol, 657mof Lidocaine. The transesophogeal  probe was passed through the esophogus of the patient. Imaged were obtained with the patient in a supine position. Image quality was excellent. The patient developed no complications during the procedure.  IMPRESSIONS    1. The left ventricle has severely reduced systolic function, with an ejection fraction of 25-30%. The cavity size was moderately dilated. There is mildly increased left ventricular wall thickness. Left ventricular diffuse hypokinesis.  2. The right ventricle has mildly reduced systolic function. The cavity was mildly enlarged. There is no increase in right ventricular wall thickness.  3. Left atrial size was moderately dilated.  4. Right atrial size was mildly dilated.  5. The mitral valve is degenerative. Mild thickening of the mitral valve leaflet. Moderate calcification of the posterior mitral valve leaflet, with severely decreased mobility. There is moderate mitral annular calcification present. Mitral valve  regurgitation is severe by color flow Doppler. The MR jet is eccentric posteriorly directed. Diastolic mitral regurgitation is present.  6. The tricuspid valve was grossly normal. Tricuspid valve regurgitation is mild-moderate.  7. The aortic valve has an indeterminate number of cusps Severe calcifcation of the aortic valve. Aortic valve regurgitation is trivial by color flow Doppler. Severe stenosis of the aortic valve.  SUMMARY   LVEF 25-30%, moderately dilated LV with severe global  hypokinesis, mild RV systolic dysfunction, severe calcific aortic stenosis, severe eccentric posteriorly directed mitral regurgitation with posterior MAC and tethering of the posterior leaflet, moderate LAE, mild RAE, mild to moderate TR  FINDINGS  Left Ventricle: The left ventricle has severely reduced systolic function, with an ejection fraction of 25-30%. The cavity size was moderately dilated. There is mildly increased left ventricular wall thickness. Left ventricular diffuse hypokinesis.  Right Ventricle: The right ventricle has mildly reduced systolic function. The cavity was mildly enlarged. There is no increase in right ventricular wall thickness.  Left Atrium: Left atrial size was moderately dilated.   Right Atrium: Right atrial size was mildly dilated. Right atrial pressure is estimated at 10 mmHg.  Interatrial Septum: No atrial level shunt detected by color flow Doppler.  Pericardium: There is no evidence of pericardial effusion.  Mitral Valve: The mitral valve is degenerative in appearance. Mild thickening of the mitral valve leaflet. Moderate calcification of the posterior mitral valve leaflet, with severely decreased mobility. There is moderate mitral annular calcification  present. Mitral valve regurgitation is severe by color flow Doppler. The MR jet is eccentric posteriorly directed. Diastolic mitral regurgitation is present.  Tricuspid Valve: The tricuspid valve was grossly normal. Tricuspid valve regurgitation is mild-moderate by color flow Doppler.  Aortic Valve: The aortic valve has an indeterminate number of cusps Severe calcifcation of the aortic valve. Aortic valve regurgitation is trivial by color flow Doppler. There is Severe stenosis of the aortic valve.  Pulmonic Valve: The pulmonic valve was grossly normal. Pulmonic valve regurgitation is trivial by color flow Doppler.    +-------------+------------++  AORTIC VALVE                  +-------------+------------++  AV Vmax:      329.33 cm/s    +-------------+------------++  AV Vmean:     204.333 cm/s   +-------------+------------++  AV VTI:       0.602 m        +-------------+------------++  AV Peak Grad: 43.4 mmHg      +-------------+------------++  AV Mean Grad: 22.0 mmHg      +-------------+------------++  +----------------+-----------++  MR Peak grad:    137.7 mmHg    +----------------+-----------++  MR Mean grad:    75.0 mmHg     +----------------+-----------++  MR Vmax:         586.75 cm/s   +----------------+-----------++  MR Vmean:        390.0 cm/s    +----------------+-----------++  MR PISA:         3.08 cm      +----------------+-----------++  MR PISA Eff ROA: 19 mm        +----------------+-----------++  MR PISA Radius:  0.70 cm       +----------------+-----------++    Lyman Bishop MD  Electronically signed by Lyman Bishop MD Signature Date/Time: 08/22/2018/2:19:06 PM  Assessment/Plan: S/P Procedure(s) (LRB): TRANSESOPHAGEAL ECHOCARDIOGRAM (TEE) (N/A)  I have personally reviewed the patient's transesophageal echocardiogram performed earlier today.  Patient has severe aortic stenosis and severe mitral regurgitation.  There was severe left ventricular systolic dysfunction with ejection fraction estimated 25 to 30% and at least moderate left ventricular chamber enlargement.  Functional anatomy of the mitral valve appears likely to be mixed primary and secondary disease.  There does appear to be some restriction of mobility of the posterior leaflet and there is definitely some posterior annular calcification.  However, there is also significant downward displacement of the mitral apparatus with a jet of regurgitation and central and directed posteriorly.  For the most part posterior leaflet is mobile and there is not severe leaflet calcification.  There is no mitral valve prolapse and there are no findings to suggest rheumatic disease.   Previous echocardiograms performed last year revealed mild to moderate (2+) mitral regurgitation.    With elective aortic valve replacement using conventional surgical techniques, there is no question at the severity of mitral regurgitation would mandate the need for concomitant mitral valve repair or replacement.  Risks associated with aortic valve replacement with concomitant mitral valve repair or replacement would unquestionably be very high because of the severity of left ventricular systolic dysfunction and the patient's underlying chronic lung disease.  Moreover, the patient is significantly deconditioned and limited by chronic respiratory failure.  Finally, aortic valve appears relatively small and it is possible that the aortic root enlargement or root replacement might be necessary to avoid the development of patient prosthesis mismatch.  As a neck step I favor proceeding with CT angiography to evaluate the feasibility of transcatheter aortic valve replacement.  I remain hopeful that the severity of mitral regurgitation will improve to a significant degree following successful transcatheter aortic valve replacement.  If the patient still has severe symptomatic mitral regurgitation following successful transcatheter heart valve replacement and optimal medical therapy, surgical intervention could be reconsidered at a later date.  We will plan to follow-up as soon as CT angiography has been completed.   I spent in excess of 15 minutes during the conduct of this hospital encounter and >50% of this time involved direct face-to-face encounter with the patient for counseling and/or coordination of their care.     Rexene Alberts, MD 08/22/2018 4:21 PM

## 2018-08-22 NOTE — Progress Notes (Signed)
  Echocardiogram Echocardiogram Transesophageal has been performed.  Teresa [name] 08/22/2018, 9:33 AM

## 2018-08-22 NOTE — CV Procedure (Signed)
TRANSESOPHAGEAL ECHOCARDIOGRAM (TEE) NOTE  INDICATIONS: aortic stenosis and mitral regurgitation  PROCEDURE:   Informed consent was obtained prior to the procedure. The risks, benefits and alternatives for the procedure were discussed and the patient comprehended these risks.  Risks include, but are not limited to, cough, sore throat, vomiting, nausea, somnolence, esophageal and stomach trauma or perforation, bleeding, low blood pressure, aspiration, pneumonia, infection, trauma to the teeth and death.    After a procedural time-out, the patient was given propofol per anesthesia.  The patient's heart rate, blood pressure, and oxygen saturation are monitored continuously during the procedure.The oropharynx was anesthetized 2 cetacaine spray.  The transesophageal probe was inserted in the esophagus and stomach without difficulty and multiple views were obtained.  The patient was kept under observation until the patient left the procedure room.  The patient left the procedure room in stable condition.   Agitated microbubble saline contrast was not administered.  COMPLICATIONS:    There were no immediate complications.  Findings:  1. LEFT VENTRICLE: The left ventricular wall thickness is mildly increased.  The left ventricular cavity is moderately dilated in size. Wall motion is severely hypokinetic.  LVEF is 25-30%.  2. RIGHT VENTRICLE:  The right ventricle is mildly hypokinetic without any thrombus or masses.    3. LEFT ATRIUM:  The left atrium is moderately dilated in size without any thrombus or masses.  There is not spontaneous echo contrast ("smoke") in the left atrium consistent with a low flow state.  4. LEFT ATRIAL APPENDAGE:  The left atrial appendage is free of any thrombus or masses. The appendage has single lobes. Pulse doppler indicates high flow in the appendage.  5. ATRIAL SEPTUM:  The atrial septum appears intact and is free of thrombus and/or masses.  There is no evidence  for interatrial shunting by color doppler and saline microbubble.  6. RIGHT ATRIUM:  The right atrium is mildly dilated in size without any thrombus or masses.  7. MITRAL VALVE:  The mitral valve is abnormal with moderate calcification of the posterior leaflet and annulus, causing leaflet tethering. There is severe, posterior/eccentrically directed mitral regurgitation which is wall-hugging. Reversal of flow in the LUPV was noted. Well-visualized in 2D/3D modes. There were no vegetations or stenosis.  8. AORTIC VALVE:  The aortic valve is trileaflet, heavily calcified and severely stenotic. Mean gradient was 26 mmHg and peak gradient of 61 mmHg. Subgastric views could not be obtained due to distal esophageal narrowing. There is  trivial regurgitation.   9. TRICUSPID VALVE:  The tricuspid valve is normal in structure and function with Moderate regurgitation.  There were no vegetations or stenosis  10.  PULMONIC VALVE:  The pulmonic valve is normal in structure and function with trivial regurgitation.  There were no vegetations or stenosis.   11. AORTIC ARCH, ASCENDING AND DESCENDING AORTA:  There was grade 1 Ron Parker et. Al, 1992) atherosclerosis of the ascending aorta, aortic arch, or proximal descending aorta.  12. PULMONARY VEINS: Anomalous pulmonary venous return was not noted.  13. PERICARDIUM: The pericardium appeared normal and non-thickened.  There is no pericardial effusion.  IMPRESSION:   1. Severe calcific aortic stenosis - mean gradient at least 26 mmHg 2. Severe eccentric mitral regurgitation with calcified/fixed posterior leaflet and annulus 3. Moderate TR - RVSP 32 mmHg + RAP 4. Severe global hypokinesis - LVEF 25-30%  RECOMMENDATIONS:    1. Ideally surgical aortic valve and mitral valve replacement, however, co-morbidities may prohibit this. Further management per structural  heart team.  Time Spent Directly with the Patient:  60 minutes   Pixie Casino, MD, [facility], [place] Director of the [facility] Lip[facility] &  Cardiovascular Risk [facility] Diplomate of the American Board of Clinical Lipidology Attending Cardiologist  Direct Dial: [phone]  Fax: [phone]  Website:  [email or web address] 08/22/2018, 9:18 AM

## 2018-08-22 NOTE — Anesthesia Preprocedure Evaluation (Signed)
Anesthesia Evaluation    Airway Mallampati: II  TM Distance: >3 FB Neck ROM: Full    Dental  (+) Teeth Intact, Dental Advisory Given   Pulmonary    Pulmonary exam normal breath sounds clear to auscultation       Cardiovascular Normal cardiovascular exam Rhythm:Regular Rate:Normal + Systolic murmurs- Diastolic murmurs    Neuro/Psych    GI/Hepatic   Endo/Other    Renal/GU      Musculoskeletal   Abdominal   Peds  Hematology   Anesthesia Other Findings   Reproductive/Obstetrics                             Anesthesia Physical Anesthesia Plan  ASA: IV  Anesthesia Plan: MAC   Post-op Pain Management:    Induction: Intravenous  PONV Risk Score and Plan: 2 and Propofol infusion and Treatment may vary due to age or medical condition  Airway Management Planned: Nasal Cannula and Natural Airway  Additional Equipment:   Intra-op Plan:   Post-operative Plan:   Informed Consent: I have reviewed the patients History and Physical, chart, labs and discussed the procedure including the risks, benefits and alternatives for the proposed anesthesia with the patient or authorized representative who has indicated his/her understanding and acceptance.     Dental advisory given  Plan Discussed with: CRNA and Anesthesiologist  Anesthesia Plan Comments:         Anesthesia Quick Evaluation

## 2018-08-22 NOTE — Progress Notes (Signed)
Progress Note  Patient Name: Teresa [name] Date of Encounter: 08/22/2018  Primary Cardiologist: Larae Grooms, MD   Subjective   Patient feels good  Teresa [name] is OK  NO CP    Inpatient Medications    Scheduled Meds:  clopidogrel  75 mg Oral Daily   fluticasone furoate-vilanterol  1 puff Inhalation Daily   furosemide  80 mg Intravenous Q12H   potassium chloride SA  40 mEq Oral BID   sodium chloride flush  3 mL Intravenous Q12H   umeclidinium bromide  1 puff Inhalation Daily   Continuous Infusions:  sodium chloride     sodium chloride     PRN Meds: sodium chloride, acetaminophen, albuterol, alum & mag hydroxide-simeth, nitroGLYCERIN, ondansetron (ZOFRAN) IV, sodium chloride flush   Vital Signs    Vitals:   08/21/18 1956 08/22/18 0108 08/22/18 0346 08/22/18 0354  BP: 115/74 120/72  119/82  Pulse: (!) 109 (!) 101  99  Resp: 20 20  20   Temp: 98 F (36.7 C) 97.9 F (36.6 C)  97.8 F (36.6 C)  TempSrc: Oral Oral  Oral  SpO2: 98% 98%  100%  Weight:   43.8 kg   Height:        Intake/Output Summary (Last 24 hours) at 08/22/2018 0745 Last data filed at 08/22/2018 0700 Gross per 24 hour  Intake 483 ml  Output 1702 ml  Net -1219 ml   Filed Weights   08/20/18 0438 08/21/18 0315 08/22/18 0346  Weight: 45.5 kg 44 kg 43.8 kg    Telemetry    SR   - Personally Reviewed   Physical Exam   Physical exam per MD:  GEN:  Thin 68 yo in No acute distress.   Neck: No JVD, no carotid bruits Cardiac: RRR,  Gr III/VI later peaking systolic murmur LSB  No rubs, or gallops.  Respiratory: Clear to auscultation bilaterally  Decreased airflow  No rales or wheezes  GI: NABS, Soft, nontender, non-distended  MS: No edema; No deformity. Neuro:  Nonfocal, moving all extremities spontaneously Psych: Normal affect   Labs    Chemistry Recent Labs  Lab 08/18/18 1115 08/19/18 0819 08/20/18 0906 08/22/18 0549  NA 139 142   135 138 138  K 3.6 3.0*   3.6 3.2* 4.0    CL 91*  --  93* 94*  CO2 30*  --  35* 37*  GLUCOSE 133*  --  133* 116*  BUN 24  --  18 17  CREATININE 0.85  --  0.59 0.67  CALCIUM 9.5  --  9.1 8.8*  GFRNONAA 72  --  >60 >60  GFRAA 83  --  >60 >60  ANIONGAP  --   --  10 7     Hematology Recent Labs  Lab 08/19/18 0819 08/20/18 0906  WBC  --  8.8  RBC  --  4.26  HGB 10.9*   12.6 11.5*  HCT 32.0*   37.0 37.8  MCV  --  88.7  MCH  --  27.0  MCHC  --  30.4  RDW  --  14.7  PLT  --  287    Cardiac EnzymesNo results for input(s): TROPONINI in the last 168 hours. No results for input(s): TROPIPOC in the last 168 hours.   BNP Recent Labs  Lab 08/21/18 1245  BNP >4,500.0*     DDimer No results for input(s): DDIMER in the last 168 hours.   Radiology    No results found.  Cardiac Studies  Left heart catheterization 08/19/2018:  Previously placed Mid RCA to Dist RCA stent (unknown type) is widely patent.  Prox RCA lesion is 50% stenosed.  Previously placed Ost Cx to Mid Cx stent (unknown type) is widely patent.  Previously placed Ost LM to Mid LAD stent (unknown type) is widely patent.  Dist Cx lesion is 30% stenosed.  3rd Mrg lesion is 100% stenosed.  1. Triple vessel CAD.  2. Patent stents in the left main, LAD and Circumflex (placed at the time of dissection) 3. Chronic occlusion small Obtuse marginal branch which fills from right to left collaterals 4. Patent mid to distal RCA stent. The proximal RCA has a moderate non-obstructive stenosis which does not appear to be flow limiting. This is unchanged from her last cath.  5. Severe aortic stenosis (mean gradient 21 mmHg, peak to peak gradient 36 mmHg, AVA 0.71 cm2) 6. Elevated filling pressures c/w acute volume overload (RA 9/14, RV 57/11/12, PA 58/27 mean 39, PCWP 33)  Recommendations: Medical management of CAD. I think the proximal RCA stenosis is unchanged from her last cath and does not appear to be flow limiting. She has severe AS. Will begin workup for  TAVR. I suspect she will be a TAVR candidate although she would be a surgical candidate as well if we leaned toward open surgical  AVR. She is volume overloaded today. Will admit for diuresis with IV Lasix.   Echocardiogram 08/20/2018: IMPRESSIONS   1. Dyskinesis of the left ventricular apex. Hypokinesis of the anterior wall and anterior septum. 2. The left ventricle has severely reduced systolic function, with an ejection fraction of 25-30%. The cavity size was moderately dilated. Left ventricular diastolic Doppler parameters are consistent with pseudonormalization. Elevated mean left atrial  pressure Left ventricular diffuse hypokinesis. 3. The right ventricle has mildly reduced systolic function. The cavity was normal. There is moderately increased right ventricular wall thickness. Right ventricular systolic pressure is moderately elevated with an estimated pressure of 55.3 mmHg. 4. Left atrial size was mild-moderately dilated. 5. Right atrial size was mildly dilated. 6. Mild thickening of the mitral valve leaflet. There is moderate mitral annular calcification present. Mitral valve regurgitation is severe by color flow Doppler. The MR jet is eccentric laterally directed. No evidence of mitral valve stenosis. 7. The aortic valve is tricuspid. Severely thickening of the aortic valve. Severe calcifcation of the aortic valve. Aortic valve regurgitation is trivial by color flow Doppler. 8. Severe aortic stenosis: mean gradient 47 mm Hg, calculated AV area 0.35 cm sq. 9. Tricuspid valve regurgitation is mild-moderate. 10. The aortic root and ascending aorta are normal in size and structure. 11. When compared to the prior study: Compared to September 2019, aortic stenosis has worsened, the left ventricle is now dilated and severely depressed and there is marked worsening of mitral regurgitation, which is now severe.  Patient Profile     68 y.o.femalewith history of CAD (prior stenting  of the LM, LAD and circumflex in the setting of dissection), prior RCA stenting, AAA, chronic bronchitis, chronic systolic and diastolic CHF, GERD, HLD, PAD, aortic stenosis and mitral regurgitation, ischemic cardiomyopathy and tobacco abuse who is being admitted post cath for volume overload and diuresis.  Assessment & Plan    1. Acute on chronic combined CHF: admitted for diuresis after [facility] 08/19/2018 for severe AS work-up revealed volume overload. Echo 08/12/2018 with EF 25-30%. Diuresing with IV lasix. UOP net -1.2L in the past 24 hours and -4L this admission. Weight 96.5lbs today, down from  103.6lbs on admission. Cr stable at 0.67 today and BNP is >4500.    Pt feels much better    - Continue IV lasix    2. Severe aortic stenosis: noted on both recent echo and [facility] 08/19/2018. TEE scheduled for today to further evaluate AS/MR. CT surgery evaluated yesterday and also recommending CTA to evaluate aortic root.  - Continue work-up per structural heart team and CT Surgery.   3. Severe mitral regurgitation: possibly functional due to dilation of LV and CHF. TEE scheduled for today to further evaluate.  - Continue diuresis as above.   4. HLD: prior intolerance to statins/zetia - Continue praulent  5. COPD: former tobacco user.  - Continue inhalers and prn nebulizers  For questions or updates, please contact [place] Please consult [email or web address] for contact info under Cardiology/STEMI.      Signed, Abigail Butts, PA-C  08/22/2018, 7:45 AM   [phone]   Pt seen and examined  I agree with findijngs as noted by Teodoro Kil above  Physical exam is mine. Pt clinically improved with diuresis   For TEE today to evaluate AV and MV  Keep on same meds for now   Will need to transition to oral lasix  Dorris Carnes MD

## 2018-08-22 NOTE — Plan of Care (Signed)

## 2018-08-22 NOTE — Anesthesia Postprocedure Evaluation (Signed)
Anesthesia Post Note  Patient: Teresa [name]  Procedure(s) Performed: TRANSESOPHAGEAL ECHOCARDIOGRAM (TEE) (N/A )     Patient location during evaluation: Endoscopy Anesthesia Type: MAC Level of consciousness: awake and alert Pain management: pain level controlled Vital Signs Assessment: post-procedure vital signs reviewed and stable Respiratory status: spontaneous breathing, nonlabored ventilation, respiratory function stable and patient connected to nasal cannula oxygen Cardiovascular status: stable, blood pressure returned to baseline and tachycardic Postop Assessment: no apparent nausea or vomiting Anesthetic complications: no    Last Vitals:  Vitals:   08/22/18 0940 08/22/18 1020  BP: 128/75 (!) 121/109  Pulse: (!) 107 (!) 106  Resp: (!) 22 18  Temp:    SpO2: 100% 100%    Last Pain:  Vitals:   08/22/18 0940  TempSrc:   PainSc: 0-No pain                 Catalina Gravel

## 2018-08-22 NOTE — H&P (Signed)
   INTERVAL PROCEDURE H&P  History and Physical Interval Note:  08/22/2018 8:41 AM  Teresa [name] has presented today for their planned procedure. The various methods of treatment have been discussed with the patient and family. After consideration of risks, benefits and other options for treatment, the patient has consented to the procedure.  The patients' outpatient history has been reviewed, patient examined, and no change in status from most recent office note within the past 30 days. I have reviewed the patients' chart and labs and will proceed as planned. Questions were answered to the patient's satisfaction.   Teresa Casino, MD, [facility], [place] Director of the [facility]id [facility] &  Cardiovascular Risk Reduction Clinic Diplomate of the American Board of Clinical Lipidology Attending Cardiologist  Direct Dial: [phone]  Fax: [phone]  Website:  [email or web address] 08/22/2018, 8:41 AM

## 2018-08-22 NOTE — Transfer of Care (Signed)
Immediate Anesthesia Transfer of Care Note  Patient: Teresa [name]  Procedure(s) Performed: TRANSESOPHAGEAL ECHOCARDIOGRAM (TEE) (N/A )  Patient Location: Endoscopy Unit  Anesthesia Type:MAC  Level of Consciousness: drowsy  Airway & Oxygen Therapy: Patient Spontanous Breathing and Patient connected to nasal cannula oxygen  Post-op Assessment: Report given to RN and Post -op Vital signs reviewed and stable  Post vital signs: Reviewed and stable  Last Vitals:  Vitals Value Taken Time  BP 102/64 08/22/18 0917  Temp    Pulse 101 08/22/18 0918  Resp 12 08/22/18 0918  SpO2 99 % 08/22/18 0918  Vitals shown include unvalidated device data.  Last Pain:  Vitals:   08/22/18 0833  TempSrc: Temporal  PainSc: 0-No pain      Patients Stated Pain Goal: 0 ([id number])  Complications: No apparent anesthesia complications

## 2018-08-22 NOTE — Anesthesia Procedure Notes (Signed)
Procedure Name: MAC Date/Time: 08/22/2018 9:00 AM Performed by: Imagene Riches, CRNA Pre-anesthesia Checklist: Patient identified, Emergency Drugs available, Suction available, Patient being monitored and Timeout performed Patient Re-evaluated:Patient Re-evaluated prior to induction Oxygen Delivery Method: Nasal cannula

## 2018-08-23 ENCOUNTER — Encounter (HOSPITAL_COMMUNITY): Payer: Self-pay | Admitting: Radiology

## 2018-08-23 ENCOUNTER — Inpatient Hospital Stay (HOSPITAL_COMMUNITY): Payer: PPO

## 2018-08-23 DIAGNOSIS — I350 Nonrheumatic aortic (valve) stenosis: Secondary | ICD-10-CM

## 2018-08-23 LAB — BASIC METABOLIC PANEL
Anion gap: 9 (ref 5–15)
BUN: 21 mg/dL (ref 8–23)
CO2: 33 mmol/L — ABNORMAL HIGH (ref 22–32)
Calcium: 8.8 mg/dL — ABNORMAL LOW (ref 8.9–10.3)
Chloride: 92 mmol/L — ABNORMAL LOW (ref 98–111)
Creatinine, Ser: 0.7 mg/dL (ref 0.44–1.00)
GFR calc Af Amer: >60 mL/min (ref >60)
GFR calc non Af Amer: >60 mL/min (ref >60)
Glucose, Bld: 119 mg/dL — ABNORMAL HIGH (ref 70–99)
Potassium: 4.6 mmol/L (ref 3.5–5.1)
Sodium: 134 mmol/L — ABNORMAL LOW (ref 135–145)

## 2018-08-23 MED ORDER — IOHEXOL 350 MG/ML SOLN
95.0000 mL | Freq: Once | INTRAVENOUS | Status: AC | PRN
  Administered 2018-08-23: 95 mL via INTRAVENOUS

## 2018-08-23 NOTE — Progress Notes (Signed)
Progress Note  Patient Name: Teresa [name] Date of Encounter: 08/23/2018  Primary Cardiologist:   Larae Grooms, MD   Subjective   No SOB.  No pain.   Inpatient Medications    Scheduled Meds:  clopidogrel  75 mg Oral Daily   fluticasone furoate-vilanterol  1 puff Inhalation Daily   furosemide  80 mg Intravenous Q12H   potassium chloride SA  40 mEq Oral BID   sodium chloride flush  10-40 mL Intracatheter Q12H   umeclidinium bromide  1 puff Inhalation Daily   Continuous Infusions:  sodium chloride     PRN Meds: sodium chloride, acetaminophen, albuterol, alum & mag hydroxide-simeth, nitroGLYCERIN, ondansetron (ZOFRAN) IV, sodium chloride flush   Vital Signs    Vitals:   08/22/18 2312 08/23/18 0225 08/23/18 0517 08/23/18 0754  BP: 108/72  108/67   Pulse:   94   Resp:   18   Temp:   99.3 F (37.4 C)   TempSrc:   Oral   SpO2:   100% 100%  Weight:  42.7 kg    Height:        Intake/Output Summary (Last 24 hours) at 08/23/2018 1218 Last data filed at 08/23/2018 0745 Gross per 24 hour  Intake 832 ml  Output 2625 ml  Net -1793 ml   Filed Weights   08/21/18 0315 08/22/18 0346 08/23/18 0225  Weight: 44 kg 43.8 kg 42.7 kg    Telemetry    NSR - Personally Reviewed  ECG    NA - Personally Reviewed  Physical Exam   GEN: No acute distress.   Neck: No  JVD Cardiac: RRR, 3/6 apical systolic murmur, no diastolic murmurs, rubs, or gallops.  Respiratory:    Decreased breath sounds. GI: Soft, nontender, non-distended  MS: No  edema; No deformity. Neuro:  Nonfocal  Psych: Normal affect   Labs    Chemistry Recent Labs  Lab 08/20/18 0906 08/22/18 0549 08/23/18 0305  NA 138 138 134*  K 3.2* 4.0 4.6  CL 93* 94* 92*  CO2 35* 37* 33*  GLUCOSE 133* 116* 119*  BUN 18 17 21   CREATININE 0.59 0.67 0.70  CALCIUM 9.1 8.8* 8.8*  GFRNONAA >60 >60 >60  GFRAA >60 >60 >60  ANIONGAP 10 7 9      Hematology Recent Labs  Lab 08/19/18 0819  08/20/18 0906  WBC  --  8.8  RBC  --  4.26  HGB 10.9*   12.6 11.5*  HCT 32.0*   37.0 37.8  MCV  --  88.7  MCH  --  27.0  MCHC  --  30.4  RDW  --  14.7  PLT  --  287    Cardiac EnzymesNo results for input(s): TROPONINI in the last 168 hours. No results for input(s): TROPIPOC in the last 168 hours.   BNP Recent Labs  Lab 08/21/18 1245  BNP >4,500.0*     DDimer No results for input(s): DDIMER in the last 168 hours.   Radiology    Ct Coronary Morph W/cta Cor W/score W/ca W/cm &/or Wo/cm  Result Date: 08/23/2018 EXAM: OVER-READ INTERPRETATION  CT CHEST The following report is an over-read performed by radiologist Dr. Salvatore Marvel of [facility], [place] on 08/23/2018. This over-read does not include interpretation of cardiac or coronary anatomy or pathology. The cardiac CTA interpretation by the cardiologist is attached. COMPARISON:  08/04/2018 chest radiograph.  01/02/2018 chest CT. FINDINGS: Please see the separate concurrent chest CT angiogram report for details. IMPRESSION: Please see  the separate concurrent chest CT angiogram report for details. Electronically Signed   By: Ilona Sorrel M.D.   On: 08/23/2018 09:54   Ct Angio Chest Aorta W/cm &/or Wo/cm  Result Date: 08/23/2018 CLINICAL DATA:  Inpatient. Severe symptomatic aortic stenosis. Pre-TAVR evaluation. EXAM: CT ANGIOGRAPHY CHEST, ABDOMEN AND PELVIS TECHNIQUE: Multidetector CT imaging through the chest, abdomen and pelvis was performed using the standard protocol during bolus administration of intravenous contrast. Multiplanar reconstructed images and MIPs were obtained and reviewed to evaluate the vascular anatomy. CONTRAST:  27mL OMNIPAQUE IOHEXOL 350 MG/ML SOLN COMPARISON:  01/02/2018 chest CT.  02/21/2015 virtual colonoscopy. FINDINGS: CTA CHEST FINDINGS Cardiovascular: Moderate cardiomegaly. No significant pericardial effusion/thickening. Left main and 3 vessel coronary atherosclerosis. Thickening and calcification of the  aortic valve. Atherosclerotic nonaneurysmal thoracic aorta. Minimal atherosclerotic calcification is present in the ascending thoracic aorta. Borderline prominent main pulmonary artery (3.3 cm diameter), stable. No central pulmonary emboli. Mediastinum/Nodes: No discrete thyroid nodules. Unremarkable esophagus. No pathologically enlarged axillary, mediastinal or hilar lymph nodes. Lungs/Pleura: No pneumothorax. Small bilateral pleural effusions, predominantly sub pulmonic on the right and dependent on the left. Moderate centrilobular and paraseptal emphysema with diffuse bronchial wall thickening. Mild passive atelectasis in the dependent lower lobes bilaterally. No acute consolidative airspace disease, lung masses or significant pulmonary nodules. Patchy subpleural reticulation and ground-glass attenuation throughout both lungs without significant traction bronchiectasis or frank honeycombing. Musculoskeletal: No aggressive appearing focal osseous lesions. Mild thoracic spondylosis. CTA ABDOMEN AND PELVIS FINDINGS Hepatobiliary: Normal liver with no liver mass. Normal gallbladder with no radiopaque cholelithiasis. No biliary ductal dilatation. Pancreas: Normal, with no mass or duct dilation. Spleen: Normal size. No mass. Adrenals/Urinary Tract: Normal adrenals. No hydronephrosis. No contour deforming renal masses. Normal bladder. Stomach/Bowel: Normal non-distended stomach. Normal caliber small bowel with no small bowel wall thickening. Normal appendix. Moderate colonic diverticulosis, most prominent in the sigmoid colon, with no large bowel wall thickening or significant pericolonic fat stranding. Vascular/Lymphatic: Atherosclerotic nonaneurysmal abdominal aorta. No pathologically enlarged lymph nodes in the abdomen or pelvis. Reproductive: Grossly normal uterus.  No adnexal mass. Other: No pneumoperitoneum, ascites or focal fluid collection. Musculoskeletal: No aggressive appearing focal osseous lesions. Minimal  lumbar spondylosis. VASCULAR MEASUREMENTS PERTINENT TO TAVR: AORTA: Minimal Aortic Diameter-6.9 x 6.1 mm (infrarenal abdominal aorta series 15/image 111) Severity of Aortic Calcification-severe RIGHT PELVIS: Right Common Iliac Artery - Minimal Diameter-4.4 x 2.9 mm Tortuosity-mild Calcification-severe Right External Iliac Artery - Minimal Diameter-3.6 x 3.4 mm Tortuosity-mild Calcification-mild Right Common Femoral Artery - Minimal Diameter-3.6 x 1.4 mm Tortuosity-mild Calcification-moderate LEFT PELVIS: Left Common Iliac Artery - Minimal Diameter-5.4 x 4.0 mm Tortuosity-mild Calcification-severe Left External Iliac Artery - Minimal Diameter-1.9 x 1.5 mm Tortuosity-mild Calcification-moderate 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 Left Common Femoral Artery - Minimal Diameter-3.0 x 2.3 mm Tortuosity-mild Calcification-severe Review of the MIP images confirms the above findings. IMPRESSION: 1. Vascular findings and measurements pertinent to potential TAVR procedure, as detailed. Of note, iliofemoral arteries are diminutive bilaterally and there are multifocal severe atherosclerotic stenoses in the infrarenal abdominal aorta and bilateral iliofemoral systems as detailed. 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 2. Thickening and calcification of the aortic valve, compatible with the reported clinical history of severe symptomatic aortic stenosis. 3. Moderate cardiomegaly. Left main and 3 vessel coronary atherosclerosis. 4. Aortic Atherosclerosis (ICD10-I70.0) and Emphysema (ICD10-J43.9). 5. Small bilateral pleural effusions. Nonspecific pulmonary fibrosis without acute pulmonary disease. 6. Moderate colonic diverticulosis. Electronically Signed  By: Ilona Sorrel M.D.   On: 08/23/2018 10:33   Ct Angio Abd/pel W/ And/or W/o  Result Date: 08/23/2018 CLINICAL DATA:  Inpatient. Severe symptomatic aortic stenosis. Pre-TAVR evaluation.  EXAM: CT ANGIOGRAPHY CHEST, ABDOMEN AND PELVIS TECHNIQUE: Multidetector CT imaging through the chest, abdomen and pelvis was performed using the standard protocol during bolus administration of intravenous contrast. Multiplanar reconstructed images and MIPs were obtained and reviewed to evaluate the vascular anatomy. CONTRAST:  51mL OMNIPAQUE IOHEXOL 350 MG/ML SOLN COMPARISON:  01/02/2018 chest CT.  02/21/2015 virtual colonoscopy. FINDINGS: CTA CHEST FINDINGS Cardiovascular: Moderate cardiomegaly. No significant pericardial effusion/thickening. Left main and 3 vessel coronary atherosclerosis. Thickening and calcification of the aortic valve. Atherosclerotic nonaneurysmal thoracic aorta. Minimal atherosclerotic calcification is present in the ascending thoracic aorta. Borderline prominent main pulmonary artery (3.3 cm diameter), stable. No central pulmonary emboli. Mediastinum/Nodes: No discrete thyroid nodules. Unremarkable esophagus. No pathologically enlarged axillary, mediastinal or hilar lymph nodes. Lungs/Pleura: No pneumothorax. Small bilateral pleural effusions, predominantly sub pulmonic on the right and dependent on the left. Moderate centrilobular and paraseptal emphysema with diffuse bronchial wall thickening. Mild passive atelectasis in the dependent lower lobes bilaterally. No acute consolidative airspace disease, lung masses or significant pulmonary nodules. Patchy subpleural reticulation and ground-glass attenuation throughout both lungs without significant traction bronchiectasis or frank honeycombing. Musculoskeletal: No aggressive appearing focal osseous lesions. Mild thoracic spondylosis. CTA ABDOMEN AND PELVIS FINDINGS Hepatobiliary: Normal liver with no liver mass. Normal gallbladder with no radiopaque cholelithiasis. No biliary ductal dilatation. Pancreas: Normal, with no mass or duct dilation. Spleen: Normal size. No mass. Adrenals/Urinary Tract: Normal adrenals. No hydronephrosis. No contour  deforming renal masses. Normal bladder. Stomach/Bowel: Normal non-distended stomach. Normal caliber small bowel with no small bowel wall thickening. Normal appendix. Moderate colonic diverticulosis, most prominent in the sigmoid colon, with no large bowel wall thickening or significant pericolonic fat stranding. Vascular/Lymphatic: Atherosclerotic nonaneurysmal abdominal aorta. No pathologically enlarged lymph nodes in the abdomen or pelvis. Reproductive: Grossly normal uterus.  No adnexal mass. Other: No pneumoperitoneum, ascites or focal fluid collection. Musculoskeletal: No aggressive appearing focal osseous lesions. Minimal lumbar spondylosis. VASCULAR MEASUREMENTS PERTINENT TO TAVR: AORTA: Minimal Aortic Diameter-6.9 x 6.1 mm (infrarenal abdominal aorta series 15/image 111) Severity of Aortic Calcification-severe RIGHT PELVIS: Right Common Iliac Artery - Minimal Diameter-4.4 x 2.9 mm Tortuosity-mild Calcification-severe Right External Iliac Artery - Minimal Diameter-3.6 x 3.4 mm Tortuosity-mild Calcification-mild Right Common Femoral Artery - Minimal Diameter-3.6 x 1.4 mm Tortuosity-mild Calcification-moderate LEFT PELVIS: Left Common Iliac Artery - Minimal Diameter-5.4 x 4.0 mm Tortuosity-mild Calcification-severe Left External Iliac Artery - Minimal Diameter-1.9 x 1.5 mm Tortuosity-mild Calcification-moderate 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 Left Common Femoral Artery - Minimal Diameter-3.0 x 2.3 mm Tortuosity-mild Calcification-severe Review of the MIP images confirms the above findings. IMPRESSION: 1. Vascular findings and measurements pertinent to potential TAVR procedure, as detailed. Of note, iliofemoral arteries are diminutive bilaterally and there are multifocal severe atherosclerotic stenoses in the infrarenal abdominal aorta and bilateral iliofemoral systems as detailed. 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  2. Thickening and calcification of the aortic valve, compatible with the reported clinical history of severe symptomatic aortic stenosis. 3. Moderate cardiomegaly. Left main and 3 vessel coronary atherosclerosis. 4. Aortic Atherosclerosis (ICD10-I70.0) and Emphysema (ICD10-J43.9). 5. Small bilateral pleural effusions. Nonspecific pulmonary fibrosis without acute pulmonary disease. 6. Moderate colonic diverticulosis. Electronically Signed   By: Ilona Sorrel M.D.   On: 08/23/2018 10:33  Cardiac Studies   Left heart catheterization 08/19/2018:   Previously placed Mid RCA to Dist RCA stent (unknown type) is widely patent.  Prox RCA lesion is 50% stenosed.  Previously placed Ost Cx to Mid Cx stent (unknown type) is widely patent.  Previously placed Ost LM to Mid LAD stent (unknown type) is widely patent.  Dist Cx lesion is 30% stenosed.  3rd Mrg lesion is 100% stenosed.  1. Triple vessel CAD.  2. Patent stents in the left main, LAD and Circumflex (placed at the time of dissection) 3. Chronic occlusion small Obtuse marginal branch which fills from right to left collaterals 4. Patent mid to distal RCA stent. The proximal RCA has a moderate non-obstructive stenosis which does not appear to be flow limiting. This is unchanged from her last cath.  5. Severe aortic stenosis (mean gradient 21 mmHg, peak to peak gradient 36 mmHg, AVA 0.71 cm2) 6. Elevated filling pressures c/w acute volume overload (RA 9/14, RV 57/11/12, PA 58/27 mean 39, PCWP 33)  Echocardiogram 08/20/2018:  1. Dyskinesis of the left ventricular apex. Hypokinesis of the anterior wall and anterior septum. 2. The left ventricle has severely reduced systolic function, with an ejection fraction of 25-30%. The cavity size was moderately dilated. Left ventricular diastolic Doppler parameters are consistent with pseudonormalization. Elevated mean left atrial  pressure Left ventricular diffuse hypokinesis. 3. The right ventricle has  mildly reduced systolic function. The cavity was normal. There is moderately increased right ventricular wall thickness. Right ventricular systolic pressure is moderately elevated with an estimated pressure of 55.3 mmHg. 4. Left atrial size was mild-moderately dilated. 5. Right atrial size was mildly dilated. 6. Mild thickening of the mitral valve leaflet. There is moderate mitral annular calcification present. Mitral valve regurgitation is severe by color flow Doppler. The MR jet is eccentric laterally directed. No evidence of mitral valve stenosis. 7. The aortic valve is tricuspid. Severely thickening of the aortic valve. Severe calcifcation of the aortic valve. Aortic valve regurgitation is trivial by color flow Doppler. 8. Severe aortic stenosis: mean gradient 47 mm Hg, calculated AV area 0.35 cm sq. 9. Tricuspid valve regurgitation is mild-moderate. 10. The aortic root and ascending aorta are normal in size and structure. 11. When compared to the prior study: Compared to September 2019, aortic stenosis has worsened, the left ventricle is now dilated and severely depressed and there is marked worsening of mitral regurgitation, which is now severe.  Patient Profile     67 y.o. female with history of CAD (prior stenting of the LM, LAD and circumflex in the setting of dissection), prior RCA stenting, AAA, chronic bronchitis, chronic systolic and diastolic CHF, GERD, HLD, PAD, aortic stenosis and mitral regurgitation, ischemic cardiomyopathy and tobacco abuse who was admitted post cath for volume overload and diuresis.  Assessment & Plan    Acute on chronic combined CHF:    Down 6.2 liters since admission.  I will continue IV Lasix today as she seems to be tolerating this and BNP is markedly elevated.  Follow renal function, Na closely  Severe aortic stenosis:      See Dr. Guy Sandifer note.  Plan tentatively is TAVR transaxial on Tuesday.      Severe mitral regurgitation:    Medical management  is planned at this point as she is high risk for open chest surgery.    HLD:    Intolerant of statins.  Continue PCSK9   COPD:       Continue current meds.  Educated about  tobacco.    CAD:  Patent stents.   For questions or updates, please contact [place] Please consult [email or web address] for contact info under Cardiology/STEMI.   Signed, Minus Breeding, MD  08/23/2018, 12:18 PM

## 2018-08-23 NOTE — Plan of Care (Signed)

## 2018-08-23 NOTE — Progress Notes (Addendum)
[address]       [place] [address]             [phone]     CARDIOTHORACIC SURGERY PROGRESS NOTE  1 Day Post-Op  S/P Procedure(s) (LRB): TRANSESOPHAGEAL ECHOCARDIOGRAM (TEE) (N/A)  Subjective: No complaints  Objective: Vital signs in last 24 hours: Temp:  [97.7 F (36.5 C)-99.3 F (37.4 C)] 99.3 F (37.4 C) (06/20 0517) Pulse Rate:  [94-109] 94 (06/20 0517) Cardiac Rhythm: Normal sinus rhythm (06/20 0700) Resp:  [18] 18 (06/20 0517) BP: (98-109)/(62-81) 108/67 (06/20 0517) SpO2:  [98 %-100 %] 100 % (06/20 0754) Weight:  [42.7 kg] 42.7 kg (06/20 0225)  Physical Exam:  Rhythm:   sinus  Breath sounds: Few rhonchi  Heart sounds:  RRR w/ systolic murmur  Incisions:  n/a  Abdomen:  soft  Extremities:  warm   Intake/Output from previous day: 06/19 0701 - 06/20 0700 In: 492 [P.O.:462; I.V.:30] Out: 2625 [Urine:2625] Intake/Output this shift: Total I/O In: 360 [P.O.:360] Out: 200 [Urine:200]  Lab Results: No results for input(s): WBC, HGB, HCT, PLT in the last 72 hours. BMET:  Recent Labs    08/22/18 0549 08/23/18 0305  NA 138 134*  K 4.0 4.6  CL 94* 92*  CO2 37* 33*  GLUCOSE 116* 119*  BUN 17 21  CREATININE 0.67 0.70  CALCIUM 8.8* 8.8*    CBG (last 3)  No results for input(s): GLUCAP in the last 72 hours. PT/INR:  No results for input(s): LABPROT, INR in the last 72 hours.  CXR:  N/A  Assessment/Plan: S/P Procedure(s) (LRB): TRANSESOPHAGEAL ECHOCARDIOGRAM (TEE) (N/A)  Clinically stable  I have personally reviewed the patient's cardiac gated CT angiogram of the heart and CTA of the chest, abdomen, and pelvis.  CT angiogram of the heart confirms the presence of a trileaflet aortic valve with findings consistent with severe aortic stenosis.  There do not appear to be any significant complicating features although her aortic root and aortic annulus is relatively small in size.  Nevertheless, it appears that the patient would be  candidate for transcatheter aortic valve replacement using a 23 mm Edwards Sapien 3 transcatheter heart valve.  The aortic root is small enough that root enlargement or root replacement might be necessary at the time of conventional surgical aortic valve replacement.  Unfortunately, patient has significant peripheral arterial disease and does not appear to have adequate pelvic vascular access to facilitate transfemoral approach for transcatheter aortic valve replacement.  However, the patient does appear to have alternative access via the left transaxillary approach.  I discussed options at length with the patient this morning.  Patient has severe mitral regurgitation which might improve following aortic valve replacement if her left ventricular function recovers.  However, I would not consider isolated aortic valve replacement using conventional surgical techniques as an option:  the mitral regurgitation would have to be surgically treated at the time of open AVR.  Risks associated with combined aortic valve replacement and mitral valve repair or replacement with or without aortic root enlargement or root replacement would be very high in this patient with severe left ventricular systolic dysfunction and underlying severe chronic lung disease.  I favor proceeding with transcatheter aortic valve replacement via left transaxillary approach as an alternative to high-risk double valve surgery.  Hopefully the patient's mitral regurgitation will improve following TAVR, but several options remain if it does not.  We tentatively plan to proceed with TAVR on Tuesday, August 26, 2018 pending review of the patient's CT angiograms by the remainder of the multidisciplinary heart team.   I spent in excess of 30 minutes during the conduct of this hospital encounter and >50% of this time involved direct face-to-face encounter with the patient for counseling and/or coordination of their care.     Rexene Alberts, MD  08/23/2018 11:50 AM

## 2018-08-24 ENCOUNTER — Encounter (HOSPITAL_COMMUNITY): Payer: Self-pay | Admitting: Internal Medicine

## 2018-08-24 ENCOUNTER — Inpatient Hospital Stay (HOSPITAL_COMMUNITY): Payer: PPO

## 2018-08-24 DIAGNOSIS — Z01810 Encounter for preprocedural cardiovascular examination: Secondary | ICD-10-CM

## 2018-08-24 LAB — BASIC METABOLIC PANEL
Anion gap: 7 (ref 5–15)
BUN: 21 mg/dL (ref 8–23)
CO2: 35 mmol/L — ABNORMAL HIGH (ref 22–32)
Calcium: 8.7 mg/dL — ABNORMAL LOW (ref 8.9–10.3)
Chloride: 90 mmol/L — ABNORMAL LOW (ref 98–111)
Creatinine, Ser: 0.65 mg/dL (ref 0.44–1.00)
GFR calc Af Amer: >60 mL/min (ref >60)
GFR calc non Af Amer: >60 mL/min (ref >60)
Glucose, Bld: 125 mg/dL — ABNORMAL HIGH (ref 70–99)
Potassium: 4.9 mmol/L (ref 3.5–5.1)
Sodium: 132 mmol/L — ABNORMAL LOW (ref 135–145)

## 2018-08-24 MED ORDER — FUROSEMIDE 80 MG PO TABS
80.0000 mg | ORAL_TABLET | Freq: Every day | ORAL | Status: DC
  Administered 2018-08-25: 80 mg via ORAL
  Filled 2018-08-24: qty 1

## 2018-08-24 NOTE — Progress Notes (Signed)
VASCULAR LAB PRELIMINARY  PRELIMINARY  PRELIMINARY  PRELIMINARY  Carotid duplex completed.    Preliminary report:  See CV proc for preliminary results   Chanequa Spees, RVT 08/24/2018, 2:06 PM

## 2018-08-24 NOTE — Progress Notes (Signed)
Progress Note  Patient Name: Teresa [name] Date of Encounter: 08/24/2018  Primary Cardiologist:   Larae Grooms, MD   Subjective   Feeling OK.  No pain.  Breathing better  Inpatient Medications    Scheduled Meds:  clopidogrel  75 mg Oral Daily   fluticasone furoate-vilanterol  1 puff Inhalation Daily   furosemide  80 mg Intravenous Q12H   potassium chloride SA  40 mEq Oral BID   sodium chloride flush  10-40 mL Intracatheter Q12H   umeclidinium bromide  1 puff Inhalation Daily   Continuous Infusions:  sodium chloride     PRN Meds: sodium chloride, acetaminophen, albuterol, alum & mag hydroxide-simeth, nitroGLYCERIN, ondansetron (ZOFRAN) IV, sodium chloride flush   Vital Signs    Vitals:   08/23/18 1948 08/24/18 0429 08/24/18 0434 08/24/18 1018  BP: 113/67 99/70    Pulse: (!) 107 86  99  Resp: 18 18  18   Temp: 99.3 F (37.4 C) 98.2 F (36.8 C)    TempSrc: Oral Oral    SpO2: 100% 100%  100%  Weight:   42.7 kg   Height:        Intake/Output Summary (Last 24 hours) at 08/24/2018 1139 Last data filed at 08/24/2018 1011 Gross per 24 hour  Intake 1140 ml  Output 2150 ml  Net -1010 ml   Filed Weights   08/22/18 0346 08/23/18 0225 08/24/18 0434  Weight: 43.8 kg 42.7 kg 42.7 kg    Telemetry    NSR - Personally Reviewed  ECG    NA - Personally Reviewed  Physical Exam   GEN: No  acute distress.   Neck: No  JVD Cardiac: RRR, 3/6 apical systolic murmur, no diastolic murmurs.  Respiratory:    Decreased breath sounds on the left base. GI: Soft, nontender, non-distended, normal bowel sounds  MS:  No edema; No deformity. Neuro:   Nonfocal  Psych: Oriented and appropriate   Labs    Chemistry Recent Labs  Lab 08/22/18 0549 08/23/18 0305 08/24/18 0308  NA 138 134* 132*  K 4.0 4.6 4.9  CL 94* 92* 90*  CO2 37* 33* 35*  GLUCOSE 116* 119* 125*  BUN 17 21 21   CREATININE 0.67 0.70 0.65  CALCIUM 8.8* 8.8* 8.7*  GFRNONAA >60 >60 >60  GFRAA  >60 >60 >60  ANIONGAP 7 9 7      Hematology Recent Labs  Lab 08/19/18 0819 08/20/18 0906  WBC  --  8.8  RBC  --  4.26  HGB 10.9*   12.6 11.5*  HCT 32.0*   37.0 37.8  MCV  --  88.7  MCH  --  27.0  MCHC  --  30.4  RDW  --  14.7  PLT  --  287    Cardiac EnzymesNo results for input(s): TROPONINI in the last 168 hours. No results for input(s): TROPIPOC in the last 168 hours.   BNP Recent Labs  Lab 08/21/18 1245  BNP >4,500.0*     DDimer No results for input(s): DDIMER in the last 168 hours.   Radiology    Ct Coronary Morph W/cta Cor W/score W/ca W/cm &/or Wo/cm  Result Date: 08/23/2018 EXAM: OVER-READ INTERPRETATION  CT CHEST The following report is an over-read performed by radiologist Dr. Salvatore Marvel of [facility], [place] on 08/23/2018. This over-read does not include interpretation of cardiac or coronary anatomy or pathology. The cardiac CTA interpretation by the cardiologist is attached. COMPARISON:  08/04/2018 chest radiograph.  01/02/2018 chest CT. FINDINGS: Please  see the separate concurrent chest CT angiogram report for details. IMPRESSION: Please see the separate concurrent chest CT angiogram report for details. Electronically Signed   By: Ilona Sorrel M.D.   On: 08/23/2018 09:54   Ct Angio Chest Aorta W/cm &/or Wo/cm  Result Date: 08/23/2018 CLINICAL DATA:  Inpatient. Severe symptomatic aortic stenosis. Pre-TAVR evaluation. EXAM: CT ANGIOGRAPHY CHEST, ABDOMEN AND PELVIS TECHNIQUE: Multidetector CT imaging through the chest, abdomen and pelvis was performed using the standard protocol during bolus administration of intravenous contrast. Multiplanar reconstructed images and MIPs were obtained and reviewed to evaluate the vascular anatomy. CONTRAST:  74mL OMNIPAQUE IOHEXOL 350 MG/ML SOLN COMPARISON:  01/02/2018 chest CT.  02/21/2015 virtual colonoscopy. FINDINGS: CTA CHEST FINDINGS Cardiovascular: Moderate cardiomegaly. No significant pericardial effusion/thickening. Left  main and 3 vessel coronary atherosclerosis. Thickening and calcification of the aortic valve. Atherosclerotic nonaneurysmal thoracic aorta. Minimal atherosclerotic calcification is present in the ascending thoracic aorta. Borderline prominent main pulmonary artery (3.3 cm diameter), stable. No central pulmonary emboli. Mediastinum/Nodes: No discrete thyroid nodules. Unremarkable esophagus. No pathologically enlarged axillary, mediastinal or hilar lymph nodes. Lungs/Pleura: No pneumothorax. Small bilateral pleural effusions, predominantly sub pulmonic on the right and dependent on the left. Moderate centrilobular and paraseptal emphysema with diffuse bronchial wall thickening. Mild passive atelectasis in the dependent lower lobes bilaterally. No acute consolidative airspace disease, lung masses or significant pulmonary nodules. Patchy subpleural reticulation and ground-glass attenuation throughout both lungs without significant traction bronchiectasis or frank honeycombing. Musculoskeletal: No aggressive appearing focal osseous lesions. Mild thoracic spondylosis. CTA ABDOMEN AND PELVIS FINDINGS Hepatobiliary: Normal liver with no liver mass. Normal gallbladder with no radiopaque cholelithiasis. No biliary ductal dilatation. Pancreas: Normal, with no mass or duct dilation. Spleen: Normal size. No mass. Adrenals/Urinary Tract: Normal adrenals. No hydronephrosis. No contour deforming renal masses. Normal bladder. Stomach/Bowel: Normal non-distended stomach. Normal caliber small bowel with no small bowel wall thickening. Normal appendix. Moderate colonic diverticulosis, most prominent in the sigmoid colon, with no large bowel wall thickening or significant pericolonic fat stranding. Vascular/Lymphatic: Atherosclerotic nonaneurysmal abdominal aorta. No pathologically enlarged lymph nodes in the abdomen or pelvis. Reproductive: Grossly normal uterus.  No adnexal mass. Other: No pneumoperitoneum, ascites or focal fluid  collection. Musculoskeletal: No aggressive appearing focal osseous lesions. Minimal lumbar spondylosis. VASCULAR MEASUREMENTS PERTINENT TO TAVR: AORTA: Minimal Aortic Diameter-6.9 x 6.1 mm (infrarenal abdominal aorta series 15/image 111) Severity of Aortic Calcification-severe RIGHT PELVIS: Right Common Iliac Artery - Minimal Diameter-4.4 x 2.9 mm Tortuosity-mild Calcification-severe Right External Iliac Artery - Minimal Diameter-3.6 x 3.4 mm Tortuosity-mild Calcification-mild Right Common Femoral Artery - Minimal Diameter-3.6 x 1.4 mm Tortuosity-mild Calcification-moderate LEFT PELVIS: Left Common Iliac Artery - Minimal Diameter-5.4 x 4.0 mm Tortuosity-mild Calcification-severe Left External Iliac Artery - Minimal Diameter-1.9 x 1.5 mm Tortuosity-mild Calcification-moderate 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 Left Common Femoral Artery - Minimal Diameter-3.0 x 2.3 mm Tortuosity-mild Calcification-severe Review of the MIP images confirms the above findings. IMPRESSION: 1. Vascular findings and measurements pertinent to potential TAVR procedure, as detailed. Of note, iliofemoral arteries are diminutive bilaterally and there are multifocal severe atherosclerotic stenoses in the infrarenal abdominal aorta and bilateral iliofemoral systems as detailed. 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 2. Thickening and calcification of the aortic valve, compatible with the reported clinical history of severe symptomatic aortic stenosis. 3. Moderate cardiomegaly. Left main and 3 vessel coronary atherosclerosis. 4. Aortic Atherosclerosis (ICD10-I70.0) and Emphysema (ICD10-J43.9). 5. Small bilateral pleural effusions. Nonspecific  pulmonary fibrosis without acute pulmonary disease. 6. Moderate colonic diverticulosis. Electronically Signed   By: Ilona Sorrel M.D.   On: 08/23/2018 10:33   Ct Angio Abd/pel W/ And/or W/o  Result Date:  08/23/2018 CLINICAL DATA:  Inpatient. Severe symptomatic aortic stenosis. Pre-TAVR evaluation. EXAM: CT ANGIOGRAPHY CHEST, ABDOMEN AND PELVIS TECHNIQUE: Multidetector CT imaging through the chest, abdomen and pelvis was performed using the standard protocol during bolus administration of intravenous contrast. Multiplanar reconstructed images and MIPs were obtained and reviewed to evaluate the vascular anatomy. CONTRAST:  33mL OMNIPAQUE IOHEXOL 350 MG/ML SOLN COMPARISON:  01/02/2018 chest CT.  02/21/2015 virtual colonoscopy. FINDINGS: CTA CHEST FINDINGS Cardiovascular: Moderate cardiomegaly. No significant pericardial effusion/thickening. Left main and 3 vessel coronary atherosclerosis. Thickening and calcification of the aortic valve. Atherosclerotic nonaneurysmal thoracic aorta. Minimal atherosclerotic calcification is present in the ascending thoracic aorta. Borderline prominent main pulmonary artery (3.3 cm diameter), stable. No central pulmonary emboli. Mediastinum/Nodes: No discrete thyroid nodules. Unremarkable esophagus. No pathologically enlarged axillary, mediastinal or hilar lymph nodes. Lungs/Pleura: No pneumothorax. Small bilateral pleural effusions, predominantly sub pulmonic on the right and dependent on the left. Moderate centrilobular and paraseptal emphysema with diffuse bronchial wall thickening. Mild passive atelectasis in the dependent lower lobes bilaterally. No acute consolidative airspace disease, lung masses or significant pulmonary nodules. Patchy subpleural reticulation and ground-glass attenuation throughout both lungs without significant traction bronchiectasis or frank honeycombing. Musculoskeletal: No aggressive appearing focal osseous lesions. Mild thoracic spondylosis. CTA ABDOMEN AND PELVIS FINDINGS Hepatobiliary: Normal liver with no liver mass. Normal gallbladder with no radiopaque cholelithiasis. No biliary ductal dilatation. Pancreas: Normal, with no mass or duct dilation.  Spleen: Normal size. No mass. Adrenals/Urinary Tract: Normal adrenals. No hydronephrosis. No contour deforming renal masses. Normal bladder. Stomach/Bowel: Normal non-distended stomach. Normal caliber small bowel with no small bowel wall thickening. Normal appendix. Moderate colonic diverticulosis, most prominent in the sigmoid colon, with no large bowel wall thickening or significant pericolonic fat stranding. Vascular/Lymphatic: Atherosclerotic nonaneurysmal abdominal aorta. No pathologically enlarged lymph nodes in the abdomen or pelvis. Reproductive: Grossly normal uterus.  No adnexal mass. Other: No pneumoperitoneum, ascites or focal fluid collection. Musculoskeletal: No aggressive appearing focal osseous lesions. Minimal lumbar spondylosis. VASCULAR MEASUREMENTS PERTINENT TO TAVR: AORTA: Minimal Aortic Diameter-6.9 x 6.1 mm (infrarenal abdominal aorta series 15/image 111) Severity of Aortic Calcification-severe RIGHT PELVIS: Right Common Iliac Artery - Minimal Diameter-4.4 x 2.9 mm Tortuosity-mild Calcification-severe Right External Iliac Artery - Minimal Diameter-3.6 x 3.4 mm Tortuosity-mild Calcification-mild Right Common Femoral Artery - Minimal Diameter-3.6 x 1.4 mm Tortuosity-mild Calcification-moderate LEFT PELVIS: Left Common Iliac Artery - Minimal Diameter-5.4 x 4.0 mm Tortuosity-mild Calcification-severe Left External Iliac Artery - Minimal Diameter-1.9 x 1.5 mm Tortuosity-mild Calcification-moderate 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 Left Common Femoral Artery - Minimal Diameter-3.0 x 2.3 mm Tortuosity-mild Calcification-severe Review of the MIP images confirms the above findings. IMPRESSION: 1. Vascular findings and measurements pertinent to potential TAVR procedure, as detailed. Of note, iliofemoral arteries are diminutive bilaterally and there are multifocal severe atherosclerotic stenoses in the infrarenal abdominal aorta  and bilateral iliofemoral systems as detailed. 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 2. Thickening and calcification of the aortic valve, compatible with the reported clinical history of severe symptomatic aortic stenosis. 3. Moderate cardiomegaly. Left main and 3 vessel coronary atherosclerosis. 4. Aortic Atherosclerosis (ICD10-I70.0) and Emphysema (ICD10-J43.9). 5. Small bilateral pleural effusions. Nonspecific pulmonary fibrosis without acute pulmonary disease. 6. Moderate colonic diverticulosis. Electronically Signed  By: Ilona Sorrel M.D.   On: 08/23/2018 10:33    Cardiac Studies   Left heart catheterization 08/19/2018:   Previously placed Mid RCA to Dist RCA stent (unknown type) is widely patent.  Prox RCA lesion is 50% stenosed.  Previously placed Ost Cx to Mid Cx stent (unknown type) is widely patent.  Previously placed Ost LM to Mid LAD stent (unknown type) is widely patent.  Dist Cx lesion is 30% stenosed.  3rd Mrg lesion is 100% stenosed.  1. Triple vessel CAD.  2. Patent stents in the left main, LAD and Circumflex (placed at the time of dissection) 3. Chronic occlusion small Obtuse marginal branch which fills from right to left collaterals 4. Patent mid to distal RCA stent. The proximal RCA has a moderate non-obstructive stenosis which does not appear to be flow limiting. This is unchanged from her last cath.  5. Severe aortic stenosis (mean gradient 21 mmHg, peak to peak gradient 36 mmHg, AVA 0.71 cm2) 6. Elevated filling pressures c/w acute volume overload (RA 9/14, RV 57/11/12, PA 58/27 mean 39, PCWP 33)  Echocardiogram 08/20/2018:  1. Dyskinesis of the left ventricular apex. Hypokinesis of the anterior wall and anterior septum. 2. The left ventricle has severely reduced systolic function, with an ejection fraction of 25-30%. The cavity size was moderately dilated. Left ventricular diastolic Doppler parameters are consistent with pseudonormalization.  Elevated mean left atrial  pressure Left ventricular diffuse hypokinesis. 3. The right ventricle has mildly reduced systolic function. The cavity was normal. There is moderately increased right ventricular wall thickness. Right ventricular systolic pressure is moderately elevated with an estimated pressure of 55.3 mmHg. 4. Left atrial size was mild-moderately dilated. 5. Right atrial size was mildly dilated. 6. Mild thickening of the mitral valve leaflet. There is moderate mitral annular calcification present. Mitral valve regurgitation is severe by color flow Doppler. The MR jet is eccentric laterally directed. No evidence of mitral valve stenosis. 7. The aortic valve is tricuspid. Severely thickening of the aortic valve. Severe calcifcation of the aortic valve. Aortic valve regurgitation is trivial by color flow Doppler. 8. Severe aortic stenosis: mean gradient 47 mm Hg, calculated AV area 0.35 cm sq. 9. Tricuspid valve regurgitation is mild-moderate. 10. The aortic root and ascending aorta are normal in size and structure. 11. When compared to the prior study: Compared to September 2019, aortic stenosis has worsened, the left ventricle is now dilated and severely depressed and there is marked worsening of mitral regurgitation, which is now severe.  Patient Profile     68 y.o. female with history of CAD (prior stenting of the LM, LAD and circumflex in the setting of dissection), prior RCA stenting, AAA, chronic bronchitis, chronic systolic and diastolic CHF, GERD, HLD, PAD, aortic stenosis and mitral regurgitation, ischemic cardiomyopathy and tobacco abuse who was admitted post cath for volume overload and diuresis.  Assessment & Plan    Acute on chronic combined CHF:    Down 7 liters since admission. Na drifting down.  Change to PO Lasix and fluid restrict.     Severe aortic stenosis:      Tentatively is TAVR transaxial on Tuesday.      Severe mitral regurgitation:    Medical  management.    HLD:    Intolerant of statins.     COPD:       Continue current meds.  Educated about tobacco.     CAD:  Patent.   Continue with risk reduction.     For  questions or updates, please contact [place] Please consult [email or web address] for contact info under Cardiology/STEMI.   Signed, Minus Breeding, MD  08/24/2018, 11:39 AM

## 2018-08-25 ENCOUNTER — Encounter (HOSPITAL_COMMUNITY): Payer: Self-pay | Admitting: Certified Registered Nurse Anesthetist

## 2018-08-25 DIAGNOSIS — I25118 Atherosclerotic heart disease of native coronary artery with other forms of angina pectoris: Secondary | ICD-10-CM

## 2018-08-25 LAB — BASIC METABOLIC PANEL
Anion gap: 8 (ref 5–15)
BUN: 26 mg/dL — ABNORMAL HIGH (ref 8–23)
CO2: 30 mmol/L (ref 22–32)
Calcium: 9 mg/dL (ref 8.9–10.3)
Chloride: 93 mmol/L — ABNORMAL LOW (ref 98–111)
Creatinine, Ser: 0.98 mg/dL (ref 0.44–1.00)
GFR calc Af Amer: >60 mL/min (ref >60)
GFR calc non Af Amer: >60 mL/min (ref >60)
Glucose, Bld: 136 mg/dL — ABNORMAL HIGH (ref 70–99)
Potassium: 5.8 mmol/L — ABNORMAL HIGH (ref 3.5–5.1)
Sodium: 131 mmol/L — ABNORMAL LOW (ref 135–145)

## 2018-08-25 LAB — TYPE AND SCREEN
ABO/RH(D): A POS
Antibody Screen: NEGATIVE

## 2018-08-25 MED ORDER — POTASSIUM CHLORIDE 2 MEQ/ML IV SOLN
80.0000 meq | INTRAVENOUS | Status: DC
  Filled 2018-08-25: qty 40

## 2018-08-25 MED ORDER — CHLORHEXIDINE GLUCONATE 4 % EX LIQD
60.0000 mL | Freq: Once | CUTANEOUS | Status: AC
  Administered 2018-08-26: 4 via TOPICAL
  Filled 2018-08-25: qty 15

## 2018-08-25 MED ORDER — CHLORHEXIDINE GLUCONATE 4 % EX LIQD
60.0000 mL | Freq: Once | CUTANEOUS | Status: AC
  Administered 2018-08-25: 4 via TOPICAL
  Filled 2018-08-25: qty 60

## 2018-08-25 MED ORDER — MAGNESIUM SULFATE 50 % IJ SOLN
40.0000 meq | INTRAMUSCULAR | Status: DC
  Filled 2018-08-25: qty 9.85

## 2018-08-25 MED ORDER — TEMAZEPAM 15 MG PO CAPS
15.0000 mg | ORAL_CAPSULE | Freq: Once | ORAL | Status: AC | PRN
  Administered 2018-08-25: 15 mg via ORAL
  Filled 2018-08-25: qty 1

## 2018-08-25 MED ORDER — BISACODYL 5 MG PO TBEC
5.0000 mg | DELAYED_RELEASE_TABLET | Freq: Once | ORAL | Status: DC
  Filled 2018-08-25: qty 1

## 2018-08-25 MED ORDER — SODIUM CHLORIDE 0.9 % IV SOLN
INTRAVENOUS | Status: DC
  Filled 2018-08-25: qty 30

## 2018-08-25 MED ORDER — DEXMEDETOMIDINE HCL IN NACL 400 MCG/100ML IV SOLN
0.1000 ug/kg/h | INTRAVENOUS | Status: DC
  Filled 2018-08-25: qty 100

## 2018-08-25 MED ORDER — LEVOFLOXACIN IN D5W 500 MG/100ML IV SOLN
500.0000 mg | INTRAVENOUS | Status: AC
  Administered 2018-08-26: 08:00:00 500 mg via INTRAVENOUS
  Filled 2018-08-25: qty 100

## 2018-08-25 MED ORDER — VANCOMYCIN HCL IN DEXTROSE 1-5 GM/200ML-% IV SOLN
1000.0000 mg | INTRAVENOUS | Status: AC
  Administered 2018-08-26: 08:00:00 1000 mg via INTRAVENOUS
  Filled 2018-08-25: qty 200

## 2018-08-25 MED ORDER — CHLORHEXIDINE GLUCONATE 0.12 % MT SOLN
15.0000 mL | Freq: Once | OROMUCOSAL | Status: AC
  Administered 2018-08-26: 15 mL via OROMUCOSAL
  Filled 2018-08-25: qty 15

## 2018-08-25 MED ORDER — NOREPINEPHRINE BITARTRATE 1 MG/ML IV SOLN
0.0000 ug/min | INTRAVENOUS | Status: AC
  Administered 2018-08-26: 1 ug/min via INTRAVENOUS
  Filled 2018-08-25: qty 4

## 2018-08-25 NOTE — Care Management Important Message (Signed)
Important Message  Patient Details  Name: Teresa [name] MRN: [id number] Date of Birth: [birth year]   Medicare Important Message Given:  Yes     [name] 08/25/2018, 1:53 PM

## 2018-08-25 NOTE — Progress Notes (Signed)
Progress Note  Patient Name: Teresa [name] Date of Encounter: 08/25/2018  Primary Cardiologist: Larae Grooms, MD   Subjective   Feels well.  Lost significant weight.   Inpatient Medications    Scheduled Meds:  clopidogrel  75 mg Oral Daily   fluticasone furoate-vilanterol  1 puff Inhalation Daily   furosemide  80 mg Oral Daily   [START ON 08/26/2018] magnesium sulfate  40 mEq Other To OR   [START ON 08/26/2018] potassium chloride  80 mEq Other To OR   sodium chloride flush  10-40 mL Intracatheter Q12H   umeclidinium bromide  1 puff Inhalation Daily   Continuous Infusions:  sodium chloride     [START ON 08/26/2018] dexmedetomidine     [START ON 08/26/2018] heparin 30,000 units/NS 1000 mL solution for CELLSAVER     levofloxacin (LEVAQUIN) IV     norepinephrine (LEVOPHED) Adult infusion     vancomycin     PRN Meds: sodium chloride, acetaminophen, albuterol, alum & mag hydroxide-simeth, nitroGLYCERIN, ondansetron (ZOFRAN) IV, sodium chloride flush   Vital Signs    Vitals:   08/24/18 1142 08/24/18 2005 08/25/18 0543 08/25/18 0852  BP: 106/65 104/63 111/68   Pulse: 100 (!) 102 96   Resp: 18 18 19    Temp: 98.2 F (36.8 C) 97.8 F (36.6 C) 98.2 F (36.8 C)   TempSrc: Oral Oral Oral   SpO2: 100% 100% 100% 96%  Weight:   42.5 kg   Height:        Intake/Output Summary (Last 24 hours) at 08/25/2018 0930 Last data filed at 08/25/2018 0905 Gross per 24 hour  Intake 1040 ml  Output 1150 ml  Net -110 ml   Last 3 Weights 08/25/2018 08/24/2018 08/23/2018  Weight (lbs) 93 lb 11.2 oz 94 lb 3.2 oz 94 lb 1.6 oz  Weight (kg) 42.502 kg 42.729 kg 42.683 kg      Telemetry    NSR- Personally Reviewed  ECG      Physical Exam   GEN: No acute distress.   Neck: No JVD Cardiac: RRR, 3/6 systolic murmur, no rubs, or gallops.  Respiratory: Clear to auscultation bilaterally.  MIld SHOB. GI: Soft, nontender, non-distended  MS: No edema; No deformity. Neuro:   Nonfocal  Psych: Normal affect   Labs    Chemistry Recent Labs  Lab 08/23/18 0305 08/24/18 0308 08/25/18 0317  NA 134* 132* 131*  K 4.6 4.9 5.8*  CL 92* 90* 93*  CO2 33* 35* 30  GLUCOSE 119* 125* 136*  BUN 21 21 26*  CREATININE 0.70 0.65 0.98  CALCIUM 8.8* 8.7* 9.0  GFRNONAA >60 >60 >60  GFRAA >60 >60 >60  ANIONGAP 9 7 8      Hematology Recent Labs  Lab 08/19/18 0819 08/20/18 0906  WBC  --  8.8  RBC  --  4.26  HGB 10.9*   12.6 11.5*  HCT 32.0*   37.0 37.8  MCV  --  88.7  MCH  --  27.0  MCHC  --  30.4  RDW  --  14.7  PLT  --  287    Cardiac EnzymesNo results for input(s): TROPONINI in the last 168 hours. No results for input(s): TROPIPOC in the last 168 hours.   BNP Recent Labs  Lab 08/21/18 1245  BNP >4,500.0*     DDimer No results for input(s): DDIMER in the last 168 hours.   Radiology    Ct Coronary Morph W/cta Cor W/score W/ca W/cm &/or Wo/cm  Result Date:  08/23/2018 EXAM: OVER-READ INTERPRETATION  CT CHEST The following report is an over-read performed by radiologist Dr. Salvatore Marvel of [facility], [place] on 08/23/2018. This over-read does not include interpretation of cardiac or coronary anatomy or pathology. The cardiac CTA interpretation by the cardiologist is attached. COMPARISON:  08/04/2018 chest radiograph.  01/02/2018 chest CT. FINDINGS: Please see the separate concurrent chest CT angiogram report for details. IMPRESSION: Please see the separate concurrent chest CT angiogram report for details. Electronically Signed   By: Ilona Sorrel M.D.   On: 08/23/2018 09:54   Ct Angio Chest Aorta W/cm &/or Wo/cm  Result Date: 08/23/2018 CLINICAL DATA:  Inpatient. Severe symptomatic aortic stenosis. Pre-TAVR evaluation. EXAM: CT ANGIOGRAPHY CHEST, ABDOMEN AND PELVIS TECHNIQUE: Multidetector CT imaging through the chest, abdomen and pelvis was performed using the standard protocol during bolus administration of intravenous contrast. Multiplanar reconstructed  images and MIPs were obtained and reviewed to evaluate the vascular anatomy. CONTRAST:  20mL OMNIPAQUE IOHEXOL 350 MG/ML SOLN COMPARISON:  01/02/2018 chest CT.  02/21/2015 virtual colonoscopy. FINDINGS: CTA CHEST FINDINGS Cardiovascular: Moderate cardiomegaly. No significant pericardial effusion/thickening. Left main and 3 vessel coronary atherosclerosis. Thickening and calcification of the aortic valve. Atherosclerotic nonaneurysmal thoracic aorta. Minimal atherosclerotic calcification is present in the ascending thoracic aorta. Borderline prominent main pulmonary artery (3.3 cm diameter), stable. No central pulmonary emboli. Mediastinum/Nodes: No discrete thyroid nodules. Unremarkable esophagus. No pathologically enlarged axillary, mediastinal or hilar lymph nodes. Lungs/Pleura: No pneumothorax. Small bilateral pleural effusions, predominantly sub pulmonic on the right and dependent on the left. Moderate centrilobular and paraseptal emphysema with diffuse bronchial wall thickening. Mild passive atelectasis in the dependent lower lobes bilaterally. No acute consolidative airspace disease, lung masses or significant pulmonary nodules. Patchy subpleural reticulation and ground-glass attenuation throughout both lungs without significant traction bronchiectasis or frank honeycombing. Musculoskeletal: No aggressive appearing focal osseous lesions. Mild thoracic spondylosis. CTA ABDOMEN AND PELVIS FINDINGS Hepatobiliary: Normal liver with no liver mass. Normal gallbladder with no radiopaque cholelithiasis. No biliary ductal dilatation. Pancreas: Normal, with no mass or duct dilation. Spleen: Normal size. No mass. Adrenals/Urinary Tract: Normal adrenals. No hydronephrosis. No contour deforming renal masses. Normal bladder. Stomach/Bowel: Normal non-distended stomach. Normal caliber small bowel with no small bowel wall thickening. Normal appendix. Moderate colonic diverticulosis, most prominent in the sigmoid colon, with  no large bowel wall thickening or significant pericolonic fat stranding. Vascular/Lymphatic: Atherosclerotic nonaneurysmal abdominal aorta. No pathologically enlarged lymph nodes in the abdomen or pelvis. Reproductive: Grossly normal uterus.  No adnexal mass. Other: No pneumoperitoneum, ascites or focal fluid collection. Musculoskeletal: No aggressive appearing focal osseous lesions. Minimal lumbar spondylosis. VASCULAR MEASUREMENTS PERTINENT TO TAVR: AORTA: Minimal Aortic Diameter-6.9 x 6.1 mm (infrarenal abdominal aorta series 15/image 111) Severity of Aortic Calcification-severe RIGHT PELVIS: Right Common Iliac Artery - Minimal Diameter-4.4 x 2.9 mm Tortuosity-mild Calcification-severe Right External Iliac Artery - Minimal Diameter-3.6 x 3.4 mm Tortuosity-mild Calcification-mild Right Common Femoral Artery - Minimal Diameter-3.6 x 1.4 mm Tortuosity-mild Calcification-moderate LEFT PELVIS: Left Common Iliac Artery - Minimal Diameter-5.4 x 4.0 mm Tortuosity-mild Calcification-severe Left External Iliac Artery - Minimal Diameter-1.9 x 1.5 mm Tortuosity-mild Calcification-moderate 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 Left Common Femoral Artery - Minimal Diameter-3.0 x 2.3 mm Tortuosity-mild Calcification-severe Review of the MIP images confirms the above findings. IMPRESSION: 1. Vascular findings and measurements pertinent to potential TAVR procedure, as detailed. Of note, iliofemoral arteries are diminutive bilaterally and there are multifocal severe atherosclerotic stenoses in the infrarenal  abdominal aorta and bilateral iliofemoral systems as detailed. 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 2. Thickening and calcification of the aortic valve, compatible with the reported clinical history of severe symptomatic aortic stenosis. 3. Moderate cardiomegaly. Left main and 3 vessel coronary atherosclerosis. 4. Aortic Atherosclerosis  (ICD10-I70.0) and Emphysema (ICD10-J43.9). 5. Small bilateral pleural effusions. Nonspecific pulmonary fibrosis without acute pulmonary disease. 6. Moderate colonic diverticulosis. Electronically Signed   By: Ilona Sorrel M.D.   On: 08/23/2018 10:33   Vas US Carotid  Result Date: 08/24/2018 Carotid Arterial Duplex Study Risk Factors:      Hyperlipidemia, current smoker, coronary artery disease, PAD. Other Factors:     Pre operative TAVR, Aortic stenosis, CHF, ischemic                    cardiomyopathy. Comparison Study:  No prior study on file for comparison. Performing Technologist: Sharion Dove RVS  Examination Guidelines: A complete evaluation includes B-mode imaging, spectral Doppler, color Doppler, and power Doppler as needed of all accessible portions of each vessel. Bilateral testing is considered an integral part of a complete examination. Limited examinations for reoccurring indications may be performed as noted.  Right Carotid Findings: +----------+--------+--------+--------+----------------------+--------+             PSV cm/s EDV cm/s Stenosis Describe               Comments  +----------+--------+--------+--------+----------------------+--------+  CCA Prox   41       14                                                 +----------+--------+--------+--------+----------------------+--------+  CCA Distal 40       12                                                 +----------+--------+--------+--------+----------------------+--------+  ICA Prox   47       19                irregular and calcific           +----------+--------+--------+--------+----------------------+--------+  ICA Distal 58       18                                                 +----------+--------+--------+--------+----------------------+--------+  ECA        71       21                                                 +----------+--------+--------+--------+----------------------+--------+  +----------+--------+-------+--------+-------------------+             PSV cm/s EDV cms Describe Arm Pressure (mmHG)  +----------+--------+-------+--------+-------------------+  Subclavian 45                                             +----------+--------+-------+--------+-------------------+ +---------+--------+--+--------+--+  Vertebral PSV cm/s 37 EDV cm/s 14  +---------+--------+--+--------+--+  Left Carotid Findings: +----------+--------+--------+--------+------------+--------+             PSV cm/s EDV cm/s Stenosis Describe     Comments  +----------+--------+--------+--------+------------+--------+  CCA Prox   45       12                                       +----------+--------+--------+--------+------------+--------+  CCA Distal 50       16                                       +----------+--------+--------+--------+------------+--------+  ICA Prox   53       18                heterogenous           +----------+--------+--------+--------+------------+--------+  ICA Distal 53       21                                       +----------+--------+--------+--------+------------+--------+  ECA        85       9                                        +----------+--------+--------+--------+------------+--------+ +----------+--------+--------+--------+-------------------+  Subclavian PSV cm/s EDV cm/s Describe Arm Pressure (mmHG)  +----------+--------+--------+--------+-------------------+             79                                              +----------+--------+--------+--------+-------------------+ +---------+--------+--+--------+--+  Vertebral PSV cm/s 53 EDV cm/s 20  +---------+--------+--+--------+--+  Summary: Right Carotid: Velocities in the right ICA are consistent with a 1-39% stenosis. Left Carotid: Velocities in the left ICA are consistent with a 1-39% stenosis. Vertebrals:  Bilateral vertebral arteries demonstrate antegrade flow. Subclavians: Normal flow hemodynamics were seen in bilateral  subclavian              arteries. *See table(s) above for measurements and observations.  Electronically signed by Monica Martinez MD on 08/24/2018 at 3:11:30 PM.    Final    Ct Angio Abd/pel W/ And/or W/o  Result Date: 08/23/2018 CLINICAL DATA:  Inpatient. Severe symptomatic aortic stenosis. Pre-TAVR evaluation. EXAM: CT ANGIOGRAPHY CHEST, ABDOMEN AND PELVIS TECHNIQUE: Multidetector CT imaging through the chest, abdomen and pelvis was performed using the standard protocol during bolus administration of intravenous contrast. Multiplanar reconstructed images and MIPs were obtained and reviewed to evaluate the vascular anatomy. CONTRAST:  43mL OMNIPAQUE IOHEXOL 350 MG/ML SOLN COMPARISON:  01/02/2018 chest CT.  02/21/2015 virtual colonoscopy. FINDINGS: CTA CHEST FINDINGS Cardiovascular: Moderate cardiomegaly. No significant pericardial effusion/thickening. Left main and 3 vessel coronary atherosclerosis. Thickening and calcification of the aortic valve. Atherosclerotic nonaneurysmal thoracic aorta. Minimal atherosclerotic calcification is present in the ascending thoracic aorta. Borderline prominent main pulmonary artery (3.3 cm diameter), stable. No central pulmonary emboli. Mediastinum/Nodes: No discrete thyroid nodules. Unremarkable esophagus. No pathologically enlarged axillary,  mediastinal or hilar lymph nodes. Lungs/Pleura: No pneumothorax. Small bilateral pleural effusions, predominantly sub pulmonic on the right and dependent on the left. Moderate centrilobular and paraseptal emphysema with diffuse bronchial wall thickening. Mild passive atelectasis in the dependent lower lobes bilaterally. No acute consolidative airspace disease, lung masses or significant pulmonary nodules. Patchy subpleural reticulation and ground-glass attenuation throughout both lungs without significant traction bronchiectasis or frank honeycombing. Musculoskeletal: No aggressive appearing focal osseous lesions. Mild thoracic  spondylosis. CTA ABDOMEN AND PELVIS FINDINGS Hepatobiliary: Normal liver with no liver mass. Normal gallbladder with no radiopaque cholelithiasis. No biliary ductal dilatation. Pancreas: Normal, with no mass or duct dilation. Spleen: Normal size. No mass. Adrenals/Urinary Tract: Normal adrenals. No hydronephrosis. No contour deforming renal masses. Normal bladder. Stomach/Bowel: Normal non-distended stomach. Normal caliber small bowel with no small bowel wall thickening. Normal appendix. Moderate colonic diverticulosis, most prominent in the sigmoid colon, with no large bowel wall thickening or significant pericolonic fat stranding. Vascular/Lymphatic: Atherosclerotic nonaneurysmal abdominal aorta. No pathologically enlarged lymph nodes in the abdomen or pelvis. Reproductive: Grossly normal uterus.  No adnexal mass. Other: No pneumoperitoneum, ascites or focal fluid collection. Musculoskeletal: No aggressive appearing focal osseous lesions. Minimal lumbar spondylosis. VASCULAR MEASUREMENTS PERTINENT TO TAVR: AORTA: Minimal Aortic Diameter-6.9 x 6.1 mm (infrarenal abdominal aorta series 15/image 111) Severity of Aortic Calcification-severe RIGHT PELVIS: Right Common Iliac Artery - Minimal Diameter-4.4 x 2.9 mm Tortuosity-mild Calcification-severe Right External Iliac Artery - Minimal Diameter-3.6 x 3.4 mm Tortuosity-mild Calcification-mild Right Common Femoral Artery - Minimal Diameter-3.6 x 1.4 mm Tortuosity-mild Calcification-moderate LEFT PELVIS: Left Common Iliac Artery - Minimal Diameter-5.4 x 4.0 mm Tortuosity-mild Calcification-severe Left External Iliac Artery - Minimal Diameter-1.9 x 1.5 mm Tortuosity-mild Calcification-moderate 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 Left Common Femoral Artery - Minimal Diameter-3.0 x 2.3 mm Tortuosity-mild Calcification-severe Review of the MIP images confirms the above findings. IMPRESSION: 1. Vascular  findings and measurements pertinent to potential TAVR procedure, as detailed. Of note, iliofemoral arteries are diminutive bilaterally and there are multifocal severe atherosclerotic stenoses in the infrarenal abdominal aorta and bilateral iliofemoral systems as detailed. 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 2. Thickening and calcification of the aortic valve, compatible with the reported clinical history of severe symptomatic aortic stenosis. 3. Moderate cardiomegaly. Left main and 3 vessel coronary atherosclerosis. 4. Aortic Atherosclerosis (ICD10-I70.0) and Emphysema (ICD10-J43.9). 5. Small bilateral pleural effusions. Nonspecific pulmonary fibrosis without acute pulmonary disease. 6. Moderate colonic diverticulosis. Electronically Signed   By: Ilona Sorrel M.D.   On: 08/23/2018 10:33    Cardiac Studies   Cath showed patent stents.  Severe AS.   Patient Profile     68 y.o. female with symptomatic severe AS.  Assessment & Plan    Plan for TAVR tomorrow.  Appears euvolemic, despite low EF.  Hopefully, EF will improve post TAVR.  Severe MR, may improve with TAVR if EF improves.    For questions or updates, please contact Ewa Gentry Please consult [email or web address] for contact info under        Signed, Larae Grooms, MD  08/25/2018, 9:30 AM

## 2018-08-25 NOTE — Evaluation (Signed)
Physical Therapy Evaluation/ Discharge Patient Details Name: Teresa [name] MRN: [id number] DOB: [birth year] Today's Date: 08/25/2018   History of Present Illness  68 y.o. female with history of CAD, AAA, chronic bronchitis, chronic systolic and diastolic CHF, GERD, HLD, PAD, aortic stenosis and mitral regurgitation, ischemic cardiomyopathy and tobacco abuse who is being admitted today post cath for volume overload and diuresis  Clinical Impression  Pt very pleasant and eager to walk and proceed with TAVR next date. Pt reports being very independent, still working in daycare until a few months ago and even recently has needed assist of spouse for homemaking due to sOB. Today pt reports no dyspnea, SOB or fatigue with gait but urgency for toileting. PreTavr assessment completed as below and pt educated for daily walking program. No further acute therapy needs with pt aware and agreeable will sign off.   6 Minute Walk Test  Distance - 550 ft      Pre    Post  BP     122/69 (81)  126/74 (87)  HR    108    120  SaO2    93    93  Modified Borg Dyspnea .5    0.5  RPE    6    7    5  Meter Walk Test  Trial   1) 5.3 sec   2) 7.0 sec  3) 5.5 sec  Average - 5.93 sec = 2.76 ft/sec   Clinical Frailty Scale - 4              Follow Up Recommendations No PT follow up    Equipment Recommendations  None recommended by PT    Recommendations for Other Services       Precautions / Restrictions Precautions Precautions: None Restrictions Weight Bearing Restrictions: No      Mobility  Bed Mobility Overal bed mobility: Independent                Transfers Overall transfer level: Independent               General transfer comment: pt able to stand from bed, pivot to [facility] and stand from toilet all without assist  Ambulation/Gait Ambulation/Gait assistance: Independent Gait Distance (Feet): 550 Feet Assistive device: None Gait Pattern/deviations:  WFL(Within Functional Limits)   Gait velocity interpretation: >4.37 ft/sec, indicative of normal walking speed General Gait Details: pt with good stability without AD and maintained sPO2 >93%  Stairs            Wheelchair Mobility    Modified Rankin (Stroke Patients Only)       Balance Overall balance assessment: No apparent balance deficits (not formally assessed)                                           Pertinent Vitals/Pain Pain Assessment: No/denies pain    Home Living Family/patient expects to be discharged to:: Private residence Living Arrangements: Spouse/significant other Available Help at Discharge: Family Type of Home: House Home Access: Stairs to enter   Technical brewer of Steps: 3 Home Layout: One level Home Equipment: Walker - 2 wheels Additional Comments: RW belongs to spouse    Prior Function Level of Independence: Independent         Comments: 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     [place]  Extremity/Trunk Assessment   Upper Extremity Assessment Upper Extremity Assessment: Overall WFL for tasks assessed    Lower Extremity Assessment Lower Extremity Assessment: Overall WFL for tasks assessed    Cervical / Trunk Assessment Cervical / Trunk Assessment: Normal  Communication   Communication: No difficulties  Cognition Arousal/Alertness: Awake/alert Behavior During Therapy: WFL for tasks assessed/performed Overall Cognitive Status: Within Functional Limits for tasks assessed                                        General Comments      Exercises     Assessment/Plan    PT Assessment Patent does not need any further PT services  PT Problem List         PT Treatment Interventions      PT Goals (Current goals can be found in the Care Plan section)  Acute Rehab PT Goals Patient Stated Goal: get back to work PT Goal Formulation: All assessment and education  complete, DC therapy    Frequency     Barriers to discharge        Co-evaluation               AM-PAC PT "6 Clicks" Mobility  Outcome Measure Help needed turning from your back to your side while in a flat bed without using bedrails?: None Help needed moving from lying on your back to sitting on the side of a flat bed without using bedrails?: None Help needed moving to and from a bed to a chair (including a wheelchair)?: None Help needed standing up from a chair using your arms (e.g., wheelchair or bedside chair)?: None Help needed to walk in hospital room?: None Help needed climbing 3-5 steps with a railing? : None 6 Click Score: 24    End of Session   Activity Tolerance: Patient tolerated treatment well Patient left: in bed;with call bell/phone within reach Nurse Communication: Mobility status PT Visit Diagnosis: Other abnormalities of gait and mobility (R26.89)    Time: 8101-7510 PT Time Calculation (min) (ACUTE ONLY): 28 min   Charges:   PT Evaluation $PT Eval Moderate Complexity: 1 Mod PT Treatments $Therapeutic Activity: 8-22 mins        Teresa [name], PT Acute Rehabilitation Services Pager: [phone] Office: [phone]   Avinash Maltos B Cledith Kamiya 08/25/2018, 11:49 AM

## 2018-08-25 NOTE — Progress Notes (Signed)
[address]       [place] [address]             [phone]     CARDIOTHORACIC SURGERY PROGRESS NOTE  3 Days Post-Op  S/P Procedure(s) (LRB): TRANSESOPHAGEAL ECHOCARDIOGRAM (TEE) (N/A)  Subjective: Feels well  Objective: Vital signs in last 24 hours: Temp:  [97.8 F (36.6 C)-98.2 F (36.8 C)] 98.1 F (36.7 C) (06/22 1654) Pulse Rate:  [96-104] 102 (06/22 1654) Cardiac Rhythm: Normal sinus rhythm (06/22 0700) Resp:  [15-19] 15 (06/22 1654) BP: (99-131)/(63-73) 99/65 (06/22 1654) SpO2:  [96 %-100 %] 100 % (06/22 1654) Weight:  [42.5 kg] 42.5 kg (06/22 0543)  Physical Exam:  Rhythm:   sinus  Breath sounds: Diminished at bases  Heart sounds:  RRR w/ systolic murmur  Incisions:  n/a  Abdomen:  soft  Extremities:  warm   Intake/Output from previous day: 06/21 0701 - 06/22 0700 In: 800 [P.O.:800] Out: 1250 [Urine:1250] Intake/Output this shift: Total I/O In: 480 [P.O.:480] Out: 200 [Urine:200]  Lab Results: No results for input(s): WBC, HGB, HCT, PLT in the last 72 hours. BMET:  Recent Labs    08/24/18 0308 08/25/18 0317  NA 132* 131*  K 4.9 5.8*  CL 90* 93*  CO2 35* 30  GLUCOSE 125* 136*  BUN 21 26*  CREATININE 0.65 0.98  CALCIUM 8.7* 9.0    CBG (last 3)  No results for input(s): GLUCAP in the last 72 hours. PT/INR:  No results for input(s): LABPROT, INR in the last 72 hours.   Cardiac TAVR CT  TECHNIQUE: The patient was scanned on a [employer]. A 120 kV retrospective scan was triggered in the descending thoracic aorta at 111 HU's. Gantry rotation speed was 250 msecs and collimation was .6 mm. No beta blockade or nitro were given. The 3D data set was reconstructed in 5% intervals of the R-R cycle. Systolic and diastolic phases were analyzed on a dedicated work station using MPR, MIP and VRT modes. The patient received 80 cc of contrast.  FINDINGS: Aortic Valve: Trileaflet aortic valve with severely  thickened and moderately calcified leaflets with severely restricted leaflet opening and no calcifications extending into the LVOT.  Aorta: Normal size with minimal calcifications in the ascending aorta, but moderate to severe atheroma and calcifications in the aortic arch. No dissection.  Sinotubular Junction: 26 x 23 mm  Ascending Thoracic Aorta: 28 x 28 mm  Aortic Arch: 21 x 21 mm  Descending Thoracic Aorta: 22 x 20 mm  Sinus of Valsalva Measurements:  Non-coronary: 25 mm  Right -coronary: 24 mm  Left -coronary: 27 mm  Coronary Artery Height above Annulus:  Left Main: 9.7 mm  Right Coronary: 11 mm  Virtual Basal Annulus Measurements:  Maximum/Minimum Diameter: 23.5 x 19.9 mm  Mean Diameter: 21.0 mm  Perimeter: 68 mm  Area: 346 mm2  Optimum Fluoroscopic Angle for Delivery:  LAO 5 CAU 8.  IMPRESSION: 1. Trileaflet aortic valve with severely thickened and moderately calcified leaflets with severely restricted leaflet opening and no calcifications extending into the LVOT. Annular measurements suitable for delivery of a 23 mm Edwards-SAPIEN 3 valve.  2. Aortic valve calcium score 2131 consistent with severe aortic stenosis.  3. Sufficient coronary to annulus distance.  4. Optimum Fluoroscopic Angle for Delivery: LAO 5 CAU 8.  5. No thrombus in the left atrial appendage.  6. Mildly dilated pulmonary artery measuring 31 mm.   Electronically Signed   By:  Ena Dawley   On: 08/25/2018 12:08   CT ANGIOGRAPHY CHEST, ABDOMEN AND PELVIS  TECHNIQUE: Multidetector CT imaging through the chest, abdomen and pelvis was performed using the standard protocol during bolus administration of intravenous contrast. Multiplanar reconstructed images and MIPs were obtained and reviewed to evaluate the vascular anatomy.  CONTRAST:  67mL OMNIPAQUE IOHEXOL 350 MG/ML SOLN  COMPARISON:  01/02/2018 chest CT.  02/21/2015 virtual colonoscopy.   FINDINGS: CTA CHEST FINDINGS  Cardiovascular: Moderate cardiomegaly. No significant pericardial effusion/thickening. Left main and 3 vessel coronary atherosclerosis. Thickening and calcification of the aortic valve. Atherosclerotic nonaneurysmal thoracic aorta. Minimal atherosclerotic calcification is present in the ascending thoracic aorta. Borderline prominent main pulmonary artery (3.3 cm diameter), stable. No central pulmonary emboli.  Mediastinum/Nodes: No discrete thyroid nodules. Unremarkable esophagus. No pathologically enlarged axillary, mediastinal or hilar lymph nodes.  Lungs/Pleura: No pneumothorax. Small bilateral pleural effusions, predominantly sub pulmonic on the right and dependent on the left. Moderate centrilobular and paraseptal emphysema with diffuse bronchial wall thickening. Mild passive atelectasis in the dependent lower lobes bilaterally. No acute consolidative airspace disease, lung masses or significant pulmonary nodules. Patchy subpleural reticulation and ground-glass attenuation throughout both lungs without significant traction bronchiectasis or frank honeycombing.  Musculoskeletal: No aggressive appearing focal osseous lesions. Mild thoracic spondylosis.  CTA ABDOMEN AND PELVIS FINDINGS  Hepatobiliary: Normal liver with no liver mass. Normal gallbladder with no radiopaque cholelithiasis. No biliary ductal dilatation.  Pancreas: Normal, with no mass or duct dilation.  Spleen: Normal size. No mass.  Adrenals/Urinary Tract: Normal adrenals. No hydronephrosis. No contour deforming renal masses. Normal bladder.  Stomach/Bowel: Normal non-distended stomach. Normal caliber small bowel with no small bowel wall thickening. Normal appendix. Moderate colonic diverticulosis, most prominent in the sigmoid colon, with no large bowel wall thickening or significant pericolonic fat stranding.  Vascular/Lymphatic: Atherosclerotic nonaneurysmal  abdominal aorta. No pathologically enlarged lymph nodes in the abdomen or pelvis.  Reproductive: Grossly normal uterus.  No adnexal mass.  Other: No pneumoperitoneum, ascites or focal fluid collection.  Musculoskeletal: No aggressive appearing focal osseous lesions. Minimal lumbar spondylosis.  VASCULAR MEASUREMENTS PERTINENT TO TAVR:  AORTA:  Minimal Aortic Diameter-6.9 x 6.1 mm (infrarenal abdominal aorta series 15/image 111)  Severity of Aortic Calcification-severe  RIGHT PELVIS:  Right Common Iliac Artery -  Minimal Diameter-4.4 x 2.9 mm  Tortuosity-mild  Calcification-severe  Right External Iliac Artery -  Minimal Diameter-3.6 x 3.4 mm  Tortuosity-mild  Calcification-mild  Right Common Femoral Artery -  Minimal Diameter-3.6 x 1.4 mm  Tortuosity-mild  Calcification-moderate  LEFT PELVIS:  Left Common Iliac Artery -  Minimal Diameter-5.4 x 4.0 mm  Tortuosity-mild  Calcification-severe  Left External Iliac Artery -  Minimal Diameter-1.9 x 1.5 mm  Tortuosity-mild  Calcification-moderate  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  Left Common Femoral Artery -  Minimal Diameter-3.0 x 2.3 mm  Tortuosity-mild  Calcification-severe  Review of the MIP images confirms the above findings.  IMPRESSION: 1. Vascular findings and measurements pertinent to potential TAVR procedure, as detailed. Of note, iliofemoral arteries are diminutive bilaterally and there are multifocal severe atherosclerotic stenoses in the infrarenal abdominal aorta and bilateral iliofemoral systems as detailed. 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 2. Thickening and calcification of the aortic valve, compatible with the reported clinical history of severe symptomatic aortic stenosis. 3. Moderate cardiomegaly. Left main and 3 vessel coronary  atherosclerosis. 4. Aortic Atherosclerosis (ICD10-I70.0) and Emphysema (ICD10-J43.9). 5. Small bilateral pleural  effusions. Nonspecific pulmonary fibrosis without acute pulmonary disease. 6. Moderate colonic diverticulosis.   Electronically Signed   By: Ilona Sorrel M.D.   On: 08/23/2018 10:33  Assessment/Plan: S/P Procedure(s) (LRB): TRANSESOPHAGEAL ECHOCARDIOGRAM (TEE) (N/A)  I have again reviewed the indications, risk, and potential benefits of transcatheter aortic valve replacement via left transaxillary approach with the patient this evening.  Alternative treatment strategies have been discussed.  All of her questions have been addressed.  A discussion has been held regarding what types of management strategies would be attempted intraoperatively in the event of life-threatening complications, including whether or not the patient would be considered a candidate for the use of cardiopulmonary bypass and/or conversion to open sternotomy for attempted surgical intervention.  The patient has been advised of a variety of complications that might develop including but not limited to risks of death, stroke, paravalvular leak, aortic dissection or other major vascular complications, aortic annulus rupture, device embolization, cardiac rupture or perforation, mitral regurgitation, acute myocardial infarction, arrhythmia, heart block or bradycardia requiring permanent pacemaker placement, congestive heart failure, respiratory failure, renal failure, pneumonia, infection, other late complications related to structural valve deterioration or migration, or other complications that might ultimately cause a temporary or permanent loss of functional independence or other long term morbidity.  The patient provides full informed consent for the procedure as described and all questions were answered.   I spent in excess of 15 minutes during the conduct of this hospital encounter and >50% of this time involved  direct face-to-face encounter with the patient for counseling and/or coordination of their care.    Rexene Alberts, MD 08/25/2018 5:34 PM

## 2018-08-25 NOTE — Progress Notes (Signed)
VAST consulted to draw labs from midline; able to flush line easily, but no blood return despite attempt with different arm positions and coughing.  Took dressing down and was able to obtain good blood return. Midline re-dressed per protocol.

## 2018-08-26 ENCOUNTER — Inpatient Hospital Stay (HOSPITAL_COMMUNITY): Payer: PPO

## 2018-08-26 ENCOUNTER — Encounter (HOSPITAL_COMMUNITY): Payer: Self-pay | Admitting: Thoracic Surgery (Cardiothoracic Vascular Surgery)

## 2018-08-26 ENCOUNTER — Inpatient Hospital Stay (HOSPITAL_COMMUNITY): Payer: PPO | Admitting: Anesthesiology

## 2018-08-26 ENCOUNTER — Encounter (HOSPITAL_COMMUNITY)
Admission: AD | Disposition: A | Discharge status: Home / Self Care | Payer: Self-pay | Source: Home / Self Care | Attending: Cardiovascular Disease

## 2018-08-26 ENCOUNTER — Other Ambulatory Visit: Payer: Self-pay | Admitting: Physician Assistant

## 2018-08-26 DIAGNOSIS — Z952 Presence of prosthetic heart valve: Secondary | ICD-10-CM

## 2018-08-26 DIAGNOSIS — I350 Nonrheumatic aortic (valve) stenosis: Secondary | ICD-10-CM

## 2018-08-26 DIAGNOSIS — I340 Nonrheumatic mitral (valve) insufficiency: Secondary | ICD-10-CM | POA: Diagnosis present

## 2018-08-26 DIAGNOSIS — Z006 Encounter for examination for normal comparison and control in clinical research program: Secondary | ICD-10-CM

## 2018-08-26 HISTORY — PX: TEE WITHOUT CARDIOVERSION: SHX5443

## 2018-08-26 LAB — POCT I-STAT 7, (LYTES, BLD GAS, ICA,H+H)
Acid-Base Excess: 4 mmol/L — ABNORMAL HIGH (ref 0.0–2.0)
Bicarbonate: 31.2 mmol/L — ABNORMAL HIGH (ref 20.0–28.0)
Calcium, Ion: 1.2 mmol/L (ref 1.15–1.40)
HCT: 34 % — ABNORMAL LOW (ref 36.0–46.0)
Hemoglobin: 11.6 g/dL — ABNORMAL LOW (ref 12.0–15.0)
O2 Saturation: 95 %
Potassium: 4.2 mmol/L (ref 3.5–5.1)
Sodium: 132 mmol/L — ABNORMAL LOW (ref 135–145)
TCO2: 33 mmol/L — ABNORMAL HIGH (ref 22–32)
pCO2 arterial: 57 mmHg — ABNORMAL HIGH (ref 32.0–48.0)
pH, Arterial: 7.346 — ABNORMAL LOW (ref 7.350–7.450)
pO2, Arterial: 81 mmHg — ABNORMAL LOW (ref 83.0–108.0)

## 2018-08-26 LAB — BASIC METABOLIC PANEL
Anion gap: 9 (ref 5–15)
BUN: 21 mg/dL (ref 8–23)
CO2: 32 mmol/L (ref 22–32)
Calcium: 9.1 mg/dL (ref 8.9–10.3)
Chloride: 92 mmol/L — ABNORMAL LOW (ref 98–111)
Creatinine, Ser: 0.72 mg/dL (ref 0.44–1.00)
GFR calc Af Amer: >60 mL/min (ref >60)
GFR calc non Af Amer: >60 mL/min (ref >60)
Glucose, Bld: 101 mg/dL — ABNORMAL HIGH (ref 70–99)
Potassium: 4.1 mmol/L (ref 3.5–5.1)
Sodium: 133 mmol/L — ABNORMAL LOW (ref 135–145)

## 2018-08-26 LAB — CBC
HCT: 37.7 % (ref 36.0–46.0)
Hemoglobin: 11.7 g/dL — ABNORMAL LOW (ref 12.0–15.0)
MCH: 27.3 pg (ref 26.0–34.0)
MCHC: 31 g/dL (ref 30.0–36.0)
MCV: 87.9 fL (ref 80.0–100.0)
Platelets: 232 10*3/uL (ref 150–400)
RBC: 4.29 MIL/uL (ref 3.87–5.11)
RDW: 15.1 % (ref 11.5–15.5)
WBC: 5.9 10*3/uL (ref 4.0–10.5)
nRBC: 0 % (ref 0.0–0.2)

## 2018-08-26 LAB — POCT I-STAT 4, (NA,K, GLUC, HGB,HCT)
Glucose, Bld: 124 mg/dL — ABNORMAL HIGH (ref 70–99)
Glucose, Bld: 190 mg/dL — ABNORMAL HIGH (ref 70–99)
HCT: 32 % — ABNORMAL LOW (ref 36.0–46.0)
HCT: 29 % — ABNORMAL LOW (ref 36.0–46.0)
Hemoglobin: 10.9 g/dL — ABNORMAL LOW (ref 12.0–15.0)
Hemoglobin: 9.9 g/dL — ABNORMAL LOW (ref 12.0–15.0)
Potassium: 4 mmol/L (ref 3.5–5.1)
Potassium: 3.5 mmol/L (ref 3.5–5.1)
Sodium: 133 mmol/L — ABNORMAL LOW (ref 135–145)
Sodium: 131 mmol/L — ABNORMAL LOW (ref 135–145)

## 2018-08-26 LAB — POCT I-STAT CREATININE: Creatinine, Ser: 0.5 mg/dL (ref 0.44–1.00)

## 2018-08-26 LAB — SARS CORONAVIRUS 2 BY RT PCR (HOSPITAL ORDER, PERFORMED IN ~~LOC~~ HOSPITAL LAB): SARS Coronavirus 2: NEGATIVE

## 2018-08-26 SURGERY — IMPLANTATION, AORTIC VALVE, TRANSCATHETER, SUBCLAVIAN ARTERY APPROACH
Anesthesia: General | Site: Chest

## 2018-08-26 MED ORDER — FENTANYL CITRATE (PF) 250 MCG/5ML IJ SOLN
INTRAMUSCULAR | Status: AC
  Filled 2018-08-26: qty 5

## 2018-08-26 MED ORDER — PHENYLEPHRINE 40 MCG/ML (10ML) SYRINGE FOR IV PUSH (FOR BLOOD PRESSURE SUPPORT)
PREFILLED_SYRINGE | INTRAVENOUS | Status: AC
  Filled 2018-08-26: qty 10

## 2018-08-26 MED ORDER — SODIUM CHLORIDE 0.9 % IV SOLN: 250.0000 mL | INTRAVENOUS | Status: DC | PRN

## 2018-08-26 MED ORDER — NITROGLYCERIN IN D5W 200-5 MCG/ML-% IV SOLN: 0.0000 ug/min | INTRAVENOUS | Status: DC

## 2018-08-26 MED ORDER — HEPARIN SODIUM (PORCINE) 1000 UNIT/ML IJ SOLN
INTRAMUSCULAR | Status: DC | PRN
  Administered 2018-08-26: 6000 [IU] via INTRAVENOUS

## 2018-08-26 MED ORDER — SODIUM CHLORIDE 0.9% FLUSH
3.0000 mL | Freq: Two times a day (BID) | INTRAVENOUS | Status: DC
  Administered 2018-08-27 – 2018-08-28 (×3): 3 mL via INTRAVENOUS

## 2018-08-26 MED ORDER — ASPIRIN 81 MG PO CHEW
81.0000 mg | CHEWABLE_TABLET | Freq: Every day | ORAL | Status: DC
  Administered 2018-08-27 – 2018-08-28 (×2): 81 mg via ORAL
  Filled 2018-08-26 (×2): qty 1

## 2018-08-26 MED ORDER — PHENYLEPHRINE HCL-NACL 20-0.9 MG/250ML-% IV SOLN: 0.0000 ug/min | INTRAVENOUS | Status: DC

## 2018-08-26 MED ORDER — VANCOMYCIN HCL IN DEXTROSE 1-5 GM/200ML-% IV SOLN
1000.0000 mg | Freq: Once | INTRAVENOUS | Status: AC
  Administered 2018-08-26: 1000 mg via INTRAVENOUS
  Filled 2018-08-26: qty 200

## 2018-08-26 MED ORDER — SODIUM CHLORIDE 0.9 % IV SOLN: INTRAVENOUS | Status: DC

## 2018-08-26 MED ORDER — PROTAMINE SULFATE 10 MG/ML IV SOLN
INTRAVENOUS | Status: AC
  Filled 2018-08-26: qty 5

## 2018-08-26 MED ORDER — ONDANSETRON HCL 4 MG/2ML IJ SOLN: 4.0000 mg | Freq: Four times a day (QID) | INTRAMUSCULAR | Status: DC | PRN

## 2018-08-26 MED ORDER — ETOMIDATE 2 MG/ML IV SOLN
INTRAVENOUS | Status: DC | PRN
  Administered 2018-08-26: 16 mg via INTRAVENOUS

## 2018-08-26 MED ORDER — FENTANYL CITRATE (PF) 100 MCG/2ML IJ SOLN
INTRAMUSCULAR | Status: DC | PRN
  Administered 2018-08-26 (×2): 50 ug via INTRAVENOUS

## 2018-08-26 MED ORDER — LACTATED RINGERS IV SOLN
INTRAVENOUS | Status: DC | PRN
  Administered 2018-08-26: 07:00:00 via INTRAVENOUS

## 2018-08-26 MED ORDER — MIDAZOLAM HCL 2 MG/2ML IJ SOLN
INTRAMUSCULAR | Status: AC
  Filled 2018-08-26: qty 2

## 2018-08-26 MED ORDER — ROCURONIUM BROMIDE 50 MG/5ML IV SOSY
PREFILLED_SYRINGE | INTRAVENOUS | Status: DC | PRN
  Administered 2018-08-26: 50 mg via INTRAVENOUS
  Administered 2018-08-26: 20 mg via INTRAVENOUS

## 2018-08-26 MED ORDER — 0.9 % SODIUM CHLORIDE (POUR BTL) OPTIME
TOPICAL | Status: DC | PRN
  Administered 2018-08-26: 1000 mL

## 2018-08-26 MED ORDER — SUGAMMADEX SODIUM 200 MG/2ML IV SOLN
INTRAVENOUS | Status: DC | PRN
  Administered 2018-08-26: 150 mg via INTRAVENOUS

## 2018-08-26 MED ORDER — SODIUM CHLORIDE (PF) 0.9 % IJ SOLN
INTRAMUSCULAR | Status: AC
  Filled 2018-08-26: qty 10

## 2018-08-26 MED ORDER — PROTAMINE SULFATE 10 MG/ML IV SOLN
INTRAVENOUS | Status: DC | PRN
  Administered 2018-08-26: 60 mg via INTRAVENOUS

## 2018-08-26 MED ORDER — DIPHENHYDRAMINE HCL 50 MG/ML IJ SOLN
INTRAMUSCULAR | Status: DC | PRN
  Administered 2018-08-26: 12.5 mg via INTRAVENOUS

## 2018-08-26 MED ORDER — CLOPIDOGREL BISULFATE 75 MG PO TABS: 75.0000 mg | ORAL_TABLET | Freq: Every day | ORAL | Status: DC

## 2018-08-26 MED ORDER — SODIUM CHLORIDE 0.9 % IV SOLN
INTRAVENOUS | Status: DC | PRN
  Administered 2018-08-26: 09:00:00 500 mL

## 2018-08-26 MED ORDER — SODIUM CHLORIDE 0.9 % IV SOLN
INTRAVENOUS | Status: DC | PRN
  Administered 2018-08-26: 08:00:00 via INTRAVENOUS

## 2018-08-26 MED ORDER — DEXAMETHASONE SODIUM PHOSPHATE 10 MG/ML IJ SOLN
INTRAMUSCULAR | Status: DC | PRN
  Administered 2018-08-26: 10 mg via INTRAVENOUS

## 2018-08-26 MED ORDER — IODIXANOL 320 MG/ML IV SOLN
INTRAVENOUS | Status: DC | PRN
  Administered 2018-08-26: 95.9 mL via INTRA_ARTERIAL

## 2018-08-26 MED ORDER — MIDAZOLAM HCL 5 MG/5ML IJ SOLN
INTRAMUSCULAR | Status: DC | PRN
  Administered 2018-08-26: 2 mg via INTRAVENOUS

## 2018-08-26 MED ORDER — LEVOFLOXACIN IN D5W 750 MG/150ML IV SOLN
750.0000 mg | INTRAVENOUS | Status: AC
  Administered 2018-08-27: 12:00:00 750 mg via INTRAVENOUS
  Filled 2018-08-26: qty 150

## 2018-08-26 MED ORDER — ONDANSETRON HCL 4 MG/2ML IJ SOLN
INTRAMUSCULAR | Status: AC
  Filled 2018-08-26: qty 2

## 2018-08-26 MED ORDER — PHENYLEPHRINE HCL (PRESSORS) 10 MG/ML IV SOLN
INTRAVENOUS | Status: DC | PRN
  Administered 2018-08-26: 80 ug via INTRAVENOUS
  Administered 2018-08-26 (×3): 120 ug via INTRAVENOUS

## 2018-08-26 MED ORDER — FENTANYL CITRATE (PF) 100 MCG/2ML IJ SOLN: 50.0000 ug | INTRAMUSCULAR | Status: DC | PRN

## 2018-08-26 MED ORDER — SODIUM CHLORIDE 0.9% FLUSH: 3.0000 mL | INTRAVENOUS | Status: DC | PRN

## 2018-08-26 MED ORDER — SODIUM CHLORIDE 0.9 % IV SOLN
INTRAVENOUS | Status: AC
  Filled 2018-08-26 (×3): qty 1.2

## 2018-08-26 MED ORDER — ONDANSETRON HCL 4 MG/2ML IJ SOLN
INTRAMUSCULAR | Status: DC | PRN
  Administered 2018-08-26: 4 mg via INTRAVENOUS

## 2018-08-26 MED ORDER — PROPOFOL 10 MG/ML IV BOLUS
INTRAVENOUS | Status: AC
  Filled 2018-08-26: qty 20

## 2018-08-26 SURGICAL SUPPLY — 94 items
ADH SKN CLS APL DERMABOND .7 (GAUZE/BANDAGES/DRESSINGS) ×2
BAG DECANTER FOR FLEXI CONT (MISCELLANEOUS) ×1 IMPLANT
BAG SNAP BAND KOVER 36X36 (MISCELLANEOUS) ×3 IMPLANT
BLADE CLIPPER SURG (BLADE) ×1 IMPLANT
BLADE OSCILLATING /SAGITTAL (BLADE) IMPLANT
BLADE STERNUM SYSTEM 6 (BLADE) IMPLANT
CABLE ADAPT CONN TEMP 6FT (ADAPTER) ×3 IMPLANT
CANNULA FEM VENOUS REMOTE 22FR (CANNULA) IMPLANT
CANNULA OPTISITE PERFUSION 16F (CANNULA) IMPLANT
CANNULA OPTISITE PERFUSION 18F (CANNULA) IMPLANT
CATH DIAG EXPO 6F FR4 (CATHETERS) ×1 IMPLANT
CATH DIAG EXPO 6F VENT PIG 145 (CATHETERS) ×6 IMPLANT
CATH EXPO 5FR AL1 (CATHETERS) ×1 IMPLANT
CATH EXTERNAL FEMALE PUREWICK (CATHETERS) IMPLANT
CATH INFINITI 6F AL2 (CATHETERS) IMPLANT
CATH S G BIP PACING (CATHETERS) ×3 IMPLANT
CLIP VESOCCLUDE MED 24/CT (CLIP) ×1 IMPLANT
CLIP VESOCCLUDE SM WIDE 24/CT (CLIP) ×1 IMPLANT
CONT SPEC 4OZ CLIKSEAL STRL BL (MISCELLANEOUS) ×6 IMPLANT
COVER BACK TABLE 24X17X13 BIG (DRAPES) IMPLANT
COVER BACK TABLE 80X110 HD (DRAPES) ×6 IMPLANT
COVER DOME SNAP 22 D (MISCELLANEOUS) IMPLANT
COVER WAND RF STERILE (DRAPES) ×3 IMPLANT
CRADLE DONUT ADULT HEAD (MISCELLANEOUS) ×3 IMPLANT
DERMABOND ADVANCED (GAUZE/BANDAGES/DRESSINGS) ×1
DERMABOND ADVANCED .7 DNX12 (GAUZE/BANDAGES/DRESSINGS) ×2 IMPLANT
DEVICE CLOSURE PERCLS PRGLD 6F (VASCULAR PRODUCTS) ×4 IMPLANT
DRAPE INCISE IOBAN 66X45 STRL (DRAPES) IMPLANT
DRSG TEGADERM 4X4.75 (GAUZE/BANDAGES/DRESSINGS) ×5 IMPLANT
ELECT CAUTERY BLADE 6.4 (BLADE) IMPLANT
ELECT REM PT RETURN 9FT ADLT (ELECTROSURGICAL) ×6
ELECTRODE REM PT RTRN 9FT ADLT (ELECTROSURGICAL) ×4 IMPLANT
FELT TEFLON 6X6 (MISCELLANEOUS) IMPLANT
FEMORAL VENOUS CANN RAP (CANNULA) IMPLANT
GAUZE SPONGE 2X2 8PLY STRL LF (GAUZE/BANDAGES/DRESSINGS) IMPLANT
GAUZE SPONGE 4X4 12PLY STRL (GAUZE/BANDAGES/DRESSINGS) ×3 IMPLANT
GLOVE BIO SURGEON STRL SZ7.5 (GLOVE) ×1 IMPLANT
GLOVE BIO SURGEON STRL SZ8 (GLOVE) IMPLANT
GLOVE EUDERMIC 7 POWDERFREE (GLOVE) IMPLANT
GLOVE ORTHO TXT STRL SZ7.5 (GLOVE) ×1 IMPLANT
GOWN STRL REUS W/ TWL LRG LVL3 (GOWN DISPOSABLE) IMPLANT
GOWN STRL REUS W/ TWL XL LVL3 (GOWN DISPOSABLE) ×2 IMPLANT
GOWN STRL REUS W/TWL LRG LVL3 (GOWN DISPOSABLE) ×6
GOWN STRL REUS W/TWL XL LVL3 (GOWN DISPOSABLE) ×3
GUIDEWIRE SAFE TJ AMPLATZ EXST (WIRE) ×4 IMPLANT
GUIDEWIRE STRAIGHT .035 260CM (WIRE) ×3 IMPLANT
INSERT FOGARTY SM (MISCELLANEOUS) IMPLANT
KIT BASIN OR (CUSTOM PROCEDURE TRAY) ×3 IMPLANT
KIT DILATOR VASC 18G NDL (KITS) ×1 IMPLANT
KIT HEART LEFT (KITS) ×3 IMPLANT
KIT SUCTION CATH 14FR (SUCTIONS) ×2 IMPLANT
KIT TURNOVER KIT B (KITS) ×3 IMPLANT
LOOP VESSEL MAXI BLUE (MISCELLANEOUS) ×1 IMPLANT
LOOP VESSEL MINI RED (MISCELLANEOUS) IMPLANT
NDL PERC 18GX7CM (NEEDLE) ×2 IMPLANT
NEEDLE 22X1 1/2 (OR ONLY) (NEEDLE) IMPLANT
NEEDLE PERC 18GX7CM (NEEDLE) ×6 IMPLANT
NS IRRIG 1000ML POUR BTL (IV SOLUTION) ×9 IMPLANT
PACK ENDOVASCULAR (PACKS) ×3 IMPLANT
PAD ARMBOARD 7.5X6 YLW CONV (MISCELLANEOUS) ×6 IMPLANT
PAD ELECT DEFIB RADIOL ZOLL (MISCELLANEOUS) ×3 IMPLANT
PENCIL BUTTON HOLSTER BLD 10FT (ELECTRODE) ×3 IMPLANT
PERCLOSE PROGLIDE 6F (VASCULAR PRODUCTS)
SET MICROPUNCTURE 5F STIFF (MISCELLANEOUS) ×3 IMPLANT
SHEATH BRITE TIP 6FR 35CM (SHEATH) ×2 IMPLANT
SHEATH BRITE TIP 7FR 35CM (SHEATH) ×1 IMPLANT
SHEATH PINNACLE 6F 10CM (SHEATH) ×5 IMPLANT
SHEATH PINNACLE 8F 10CM (SHEATH) ×3 IMPLANT
SLEEVE REPOSITIONING LENGTH 30 (MISCELLANEOUS) ×3 IMPLANT
SPONGE GAUZE 2X2 STER 10/PKG (GAUZE/BANDAGES/DRESSINGS) ×1
SPONGE LAP 4X18 RFD (DISPOSABLE) IMPLANT
STOPCOCK MORSE 400PSI 3WAY (MISCELLANEOUS) ×6 IMPLANT
SUT ETHIBOND X763 2 0 SH 1 (SUTURE) IMPLANT
SUT GORETEX CV 4 TH 22 36 (SUTURE) IMPLANT
SUT GORETEX CV4 TH-18 (SUTURE) ×1 IMPLANT
SUT MNCRL AB 3-0 PS2 18 (SUTURE) IMPLANT
SUT PROLENE 5 0 C 1 36 (SUTURE) IMPLANT
SUT PROLENE 6 0 C 1 30 (SUTURE) IMPLANT
SUT SILK  1 MH (SUTURE) ×1
SUT SILK 1 MH (SUTURE) ×2 IMPLANT
SUT VIC AB 2-0 CT1 27 (SUTURE)
SUT VIC AB 2-0 CT1 36 (SUTURE) ×1 IMPLANT
SUT VIC AB 2-0 CT1 TAPERPNT 27 (SUTURE) IMPLANT
SUT VIC AB 2-0 CTX 36 (SUTURE) IMPLANT
SUT VIC AB 3-0 SH 8-18 (SUTURE) IMPLANT
SYR 50ML LL SCALE MARK (SYRINGE) ×3 IMPLANT
SYR BULB IRRIGATION 50ML (SYRINGE) IMPLANT
SYR CONTROL 10ML LL (SYRINGE) IMPLANT
TOWEL GREEN STERILE (TOWEL DISPOSABLE) ×3 IMPLANT
TOWEL GREEN STERILE FF (TOWEL DISPOSABLE) ×3 IMPLANT
TRANSDUCER W/STOPCOCK (MISCELLANEOUS) ×6 IMPLANT
TRAY FOLEY SLVR 16FR TEMP STAT (SET/KITS/TRAYS/PACK) IMPLANT
VALVE HEART TRANSCATH SZ3 23MM (Valve) ×1 IMPLANT
WIRE .035 3MM-J 145CM (WIRE) ×3 IMPLANT

## 2018-08-26 NOTE — Progress Notes (Signed)
      [address]       [place] [address]             [phone]     CARDIOTHORACIC SURGERY PROGRESS NOTE  Subjective: Teresa [name] has been scheduled for Procedure(s): TRANSESOPHAGEAL ECHOCARDIOGRAM (TEE) (N/A) today.   Objective: Vital signs in last 24 hours: Temp:  [97.8 F (36.6 C)-98.1 F (36.7 C)] 98 F (36.7 C) (06/23 0413) Pulse Rate:  [86-104] 86 (06/23 0413) Cardiac Rhythm: Sinus tachycardia (06/22 1900) Resp:  [15-19] 18 (06/23 0413) BP: (99-131)/(65-73) 99/68 (06/23 0413) SpO2:  [96 %-100 %] 100 % (06/23 0413) Weight:  [42.7 kg] 42.7 kg (06/23 0337)  Physical Exam: Unchanged from previously   Intake/Output from previous day: 06/22 0701 - 06/23 0700 In: 960 [P.O.:960] Out: 1050 [Urine:1050] Intake/Output this shift: Total I/O In: 240 [P.O.:240] Out: 850 [Urine:850]  Lab Results: No results for input(s): WBC, HGB, HCT, PLT in the last 72 hours. BMET:  Recent Labs    08/24/18 0308 08/25/18 0317  NA 132* 131*  K 4.9 5.8*  CL 90* 93*  CO2 35* 30  GLUCOSE 125* 136*  BUN 21 26*  CREATININE 0.65 0.98  CALCIUM 8.7* 9.0    CBG (last 3)  No results for input(s): GLUCAP in the last 72 hours. PT/INR:  No results for input(s): LABPROT, INR in the last 72 hours.    SARS-CoV-2, NAA NOT DETECTED NOT DETECTED      Assessment/Plan:   The various methods of treatment have been discussed with the patient. After consideration of the risks, benefits and treatment options the patient has consented to the planned procedure.   COVID test confirmed NEGATIVE prior to this admission and patient has remained hospitalized since 08/19/2018  The patient has been seen and labs reviewed. There are no changes in the patient's condition to prevent proceeding with the planned procedure today.    Rexene Alberts, MD 08/26/2018 6:23 AM

## 2018-08-26 NOTE — Anesthesia Procedure Notes (Signed)
Central Venous Catheter Insertion Performed by: Albertha Ghee, MD, anesthesiologist Patient location: Pre-op. Preanesthetic checklist: patient identified, IV checked, site marked, risks and benefits discussed, surgical consent, monitors and equipment checked, pre-op evaluation, timeout performed and anesthesia consent Position: Trendelenburg Lidocaine 1% used for infiltration and patient sedated Hand hygiene performed , maximum sterile barriers used  and Seldinger technique used Catheter size: 7 Fr Central line was placed.Double lumen Procedure performed using ultrasound guided technique. Ultrasound Notes:anatomy identified, needle tip was noted to be adjacent to the nerve/plexus identified, no ultrasound evidence of intravascular and/or intraneural injection and image(s) printed for medical record Attempts: 1 Following insertion, line sutured, dressing applied and Biopatch. Post procedure assessment: blood return through all ports, free fluid flow and no air  Patient tolerated the procedure well with no immediate complications.

## 2018-08-26 NOTE — Progress Notes (Signed)
  [facility] VALVE TEAM  Patient doing well s/p TAVR. She is hemodynamically stable. Groin site and subclavian site stable. ECG with no high grade block. Arterial line discontinued and transferred  to 4E. Plan for early ambulation after bedrest completed and hopeful discharge over the next 24-48 hours.   Angelena Form PA-C  [facility]  Pager [phone]

## 2018-08-26 NOTE — Anesthesia Preprocedure Evaluation (Signed)
Anesthesia Evaluation  Patient identified by MRN, date of birth, ID band Patient awake    Reviewed: Allergy & Precautions, H&P , NPO status , Patient's Chart, lab work & pertinent test results  Airway Mallampati: II   Neck ROM: full    Dental   Pulmonary COPD, former smoker,    breath sounds clear to auscultation       Cardiovascular + angina + CAD, + Cardiac Stents, + Peripheral Vascular Disease, +CHF and + DOE  + Valvular Problems/Murmurs MR and AS  Rhythm:regular Rate:Normal  EF 20-25%   Neuro/Psych    GI/Hepatic GERD  ,  Endo/Other    Renal/GU      Musculoskeletal  (+) Arthritis ,   Abdominal   Peds  Hematology   Anesthesia Other Findings   Reproductive/Obstetrics                             Anesthesia Physical Anesthesia Plan  ASA: IV  Anesthesia Plan: General   Post-op Pain Management:    Induction: Intravenous  PONV Risk Score and Plan: 3 and Ondansetron, Dexamethasone and Treatment may vary due to age or medical condition  Airway Management Planned: Oral ETT  Additional Equipment: Arterial line, CVP and Ultrasound Guidance Line Placement  Intra-op Plan:   Post-operative Plan:   Informed Consent: I have reviewed the patients History and Physical, chart, labs and discussed the procedure including the risks, benefits and alternatives for the proposed anesthesia with the patient or authorized representative who has indicated his/her understanding and acceptance.       Plan Discussed with: CRNA, Anesthesiologist and Surgeon  Anesthesia Plan Comments:         Anesthesia Quick Evaluation

## 2018-08-26 NOTE — Progress Notes (Signed)
Bethena Roys, RN, Nurse on [facility] notified that patient needs a rapid COVID test done ASAP prior to coming to short stay for surgery.  Order for test has been placed.  Bethena Roys verbalized understanding.

## 2018-08-26 NOTE — Discharge Instructions (Signed)

## 2018-08-26 NOTE — Transfer of Care (Signed)
Immediate Anesthesia Transfer of Care Note  Patient: Teresa [name]  Procedure(s) Performed: TRANSCATHETER AORTIC VALVE REPLACEMENT,SUBCLAVIAN (N/A Chest) TRANSESOPHAGEAL ECHOCARDIOGRAM (TEE) (N/A )  Patient Location: PACU and Cath Lab  Anesthesia Type:General  Level of Consciousness: awake, alert  and oriented  Airway & Oxygen Therapy: Patient Spontanous Breathing and Patient connected to face mask oxygen  Post-op Assessment: Report given to RN and Post -op Vital signs reviewed and stable  Post vital signs: Reviewed and stable  Last Vitals:  Vitals Value Taken Time  BP 134/70 08/26/18 1031  Temp    Pulse 92 08/26/18 1036  Resp 25 08/26/18 1036  SpO2 96 % 08/26/18 1036  Vitals shown include unvalidated device data.  Last Pain:  Vitals:   08/26/18 1032  TempSrc:   PainSc: 0-No pain      Patients Stated Pain Goal: 0 (28/31/51 7616)  Complications: No apparent anesthesia complications

## 2018-08-26 NOTE — Anesthesia Procedure Notes (Signed)
Procedure Name: Intubation Date/Time: 08/26/2018 8:03 AM Performed by: Inda Coke, CRNA Pre-anesthesia Checklist: Patient identified, Emergency Drugs available, Suction available and Patient being monitored Patient Re-evaluated:Patient Re-evaluated prior to induction Oxygen Delivery Method: Circle System Utilized Preoxygenation: Pre-oxygenation with 100% oxygen Induction Type: IV induction Ventilation: Mask ventilation without difficulty Laryngoscope Size: Mac and 3 Grade View: Grade I Tube type: Oral Number of attempts: 1 Airway Equipment and Method: Stylet and Oral airway Placement Confirmation: ETT inserted through vocal cords under direct vision,  positive ETCO2 and breath sounds checked- equal and bilateral Secured at: 21 cm Tube secured with: Tape Dental Injury: Teeth and Oropharynx as per pre-operative assessment

## 2018-08-26 NOTE — Progress Notes (Signed)
08/26/2018 1200 Received post TAVR pt to room 4E-15 from PACU.  Pt is A&O, no C/O voiced.  Tele monitor applied and CCMD notified.  CHG bath given.  R groin level zero.  Reminded pt to remain in bed till 1435.  Pt voiced understanding. Oriented to room, call light and bed.  Call bell in reach.

## 2018-08-26 NOTE — Op Note (Signed)
CARDIOTHORACIC SURGERY OPERATIVE NOTE  Date of Procedure:  08/26/2018  Preoperative Diagnosis:   Severe Aortic Stenosis   Severe Mitral Regurgitation  Postoperative Diagnosis: Same   Procedure:    Transcatheter Aortic Valve Replacement - Open Left Transaxillary Approach  Edwards Sapien 3 Transcatheter Heart Valve (size 23 mm, model # 9600TFX, serial # [id number])   Co-Surgeons:  Valentina Gu. Roxy Manns, MD and Lauree Chandler, MD  Anesthesiologist:  Albertha Ghee, MD  Echocardiographer:  Ena Dawley, MD  Pre-operative Echo Findings:  Severe aortic stenosis  Severe mitral regurgitation  Severe left ventricular systolic dysfunction  Post-operative Echo Findings:  Mild paravalvular leak  Unchanged left ventricular systolic function     BRIEF CLINICAL NOTE AND INDICATIONS FOR SURGERY  Patient is a 68 year old female with history of aortic stenosis, coronary artery disease, chronic combined systolic and diastolic congestive heart failure, lung cancer status post surgical resection, and severe COPD on home oxygen therapy who was admitted to the hospital with acute exacerbation of chronic combined systolic and diastolic congestive heart failure who has been referred for surgical consultation to discuss treatment options for management of severe aortic stenosis and mitral regurgitation.  Patient's cardiac history dates back to 2014 when she was found to have lung mass consistent with early stage lung cancer.  She was referred for preoperative cardiac evaluation and underwent catheterization demonstrating single-vessel coronary artery disease involving the right coronary artery.  She underwent PCI and stenting of the right coronary artery and this procedure was complicated by coronary dissection which required stenting of the left main, left anterior descending coronary artery, and left circumflex coronary arteries.  She ultimately recovered and eventually underwent right upper  lobectomy by Dr. Cyndia Bent for stage Ib (T2a, N0, M0) poorly differentiated squamous cell carcinoma of the lung in December 2014.  She quit smoking permanently at that time and has been followed regularly ever since by Dr. Earlie Server.  She has remained free of signs of recurrent cancer.  She has been followed by Dr. Melvyn Novas for management of underlying severe COPD and she has remained on home oxygen which she typically uses only at night for helping with sleep.  She did well from a cardiac standpoint until 2018 when she was hospitalized with congestive heart failure.  Echocardiogram performed at that time revealed findings consistent with severe aortic stenosis with mild to moderate left ventricular systolic dysfunction and ejection fraction estimated 45 to 50%.  Peak velocity across the aortic valve at that time was measured 4.5 m/s corresponding to mean transvalvular gradient estimated 52 mmHg with aortic valve area calculated 0.44 cm and DVI reported 0.14.  She underwent diagnostic cardiac catheterization at that time which revealed continued patency of all previous coronary stents and what was felt to be mild aortic stenosis.  She has continued to have problems with shortness of breath ever since.  She was hospitalized again in August 2019 with another episode of acute exacerbation of congestive heart failure.  Echocardiogram performed November 05, 2017 again revealed findings consistent with severe aortic stenosis with peak velocity across the aortic valve measured 4.3 m/s corresponding to mean transvalvular gradient estimated 45 mmHg and aortic valve area calculated 0.69 cm with DVI reported 0.22.  Mild to moderate mitral regurgitation was reported at that time.  Medical therapy was recommended.  Last fall she was seen in the emergency department on 2 occasions for acute exacerbations of shortness of breath and recently she was seen in follow-up by Dr. Melvyn Novas with worsening symptoms of shortness of  breath.  Chest  x-ray performed at that time revealed vascular congestion and proBNP level was elevated >4974.  A follow-up echocardiogram was requested and performed on August 12, 2018 which revealed severe left ventricular systolic dysfunction with ejection fraction estimated only 25 to 30%.  There was moderate left ventricular chamber enlargement.  There remained severe aortic stenosis with peak velocity across the aortic valve measured 4.8 m/s corresponding to mean transvalvular gradient estimated 47 mmHg and aortic valve area calculated 0.35 cm.  The DVI was reported 0.11.  There was also severe mitral regurgitation.  The patient was scheduled for elective left and right heart catheterization which was performed August 19, 2018 by Dr. Angelena Form.  Catheterization revealed continued patency of the stents in the right coronary artery, left main coronary artery, the left anterior descending coronary artery, and the left circumflex coronary arteries.  There was 50% proximal stenosis of the right coronary artery and 100% occlusion of the third obtuse marginal branch of the left circumflex.  Peak to peak and mean transvalvular gradients across the aortic valve were measured 36 and 21 mmHg, respectively with aortic valve area calculated 0.71 cm.  There was moderate to severe pulmonary hypertension with PA pressures measured 58/27 and mean pulmonary capillary wedge pressure 33 mmHg.  The patient was notably volume overloaded with severely elevated filling pressures and subsequently admitted to the hospital for acute exacerbation of chronic congestive heart failure.  Cardiothoracic surgical consultation was requested.  During the course of the patient's preoperative work up they have been evaluated comprehensively by a multidisciplinary team of specialists coordinated through the [facility] in the [facility].  They have been demonstrated to suffer from symptomatic severe aortic stenosis  and severe mitral regurgitation as noted above. The patient has been counseled extensively as to the relative risks and benefits of all options for the treatment of severe aortic stenosis including long term medical therapy, conventional surgery for aortic valve replacement, and transcatheter aortic valve replacement.  All questions have been answered, and the patient provides full informed consent for the operation as described.   DETAILS OF THE OPERATIVE PROCEDURE  PREPARATION:    The patient is brought to the operating room on the above mentioned date and appropriate monitoring was established by the anesthesia team. The patient is placed in the supine position on the operating table.  Intravenous antibiotics are administered.  General endotracheal anesthesia is induced uneventfully.  A Foley catheter is placed.  Baseline transesophageal echocardiogram was performed. The patient's chest, abdomen, both groins, and both lower extremities are prepared and draped in a sterile manner. A time out procedure is performed.   PERIPHERAL ACCESS:    Using the modified Seldinger technique, femoral arterial and venous access was obtained with placement of 6 Fr sheaths on the right side.  A pigtail diagnostic catheter was passed through the right arterial sheath under fluoroscopic guidance into the aortic root.  A temporary transvenous pacemaker catheter was passed through the right femoral venous sheath under fluoroscopic guidance into the right ventricle.  The pacemaker was tested to ensure stable lead placement and pacemaker capture. Aortic root angiography was performed in order to determine the optimal angiographic angle for valve deployment.   TRANSAXILLARY ACCESS:   A small oblique incision is made in the left deltopectoral groove.  The incision is completed through the subcutaneous tissues with electrocautery.  The muscle fibers of the pectoralis major muscle are split longitudinally.  The deep  pectoralis  fascia is incised.  The insertion of the pectoralis minor muscle was retracted laterally.  Sharp dissection is utilized to continue dissection and identify the axillary artery and an appropriate site for arterial puncture is identified.  A pair of CV-4 Gore-tex sutures are place as diamond-shaped purse-strings on the anterior surface of the left axillary artery.  The patient is heparinized systemically and ACT verified > 250 seconds.  The left axillary artery is punctured using an 18 gauge needle and a soft J-tipped guidewire is passed through the left subclavian artery into the aorta under fluoroscopic guidance.  A 6 Fr straight diagnostic catheter is placed over the guidewire and the guidewire is removed.  An AL-1 catheter is passed through the sheath and utilized to direct guidewire to the aortic root.  The guidewire was exchanged for a straight wire which is utilized to cross the aortic valve.  The AL-1 catheter was advanced into the left ventricle and the straight wire removed and exchanged for a long J-wire.  The AL-1 catheter was removed and exchanged for a long pigtail catheter positioned with tip within the left ventricle.  An Amplatz extra-stiff wire is prepared with a curve at the distal end for positioning within the left ventricle.  The guidewire was advanced through the long pigtail catheter and positioned in the left ventricle.  Serial dilators are passed over the guidewire under continuous fluoroscopic guidance, making certain that each dilator passes easily all of the way into the proximal left subclavian artery.  A 14 Fr Commander introducer sheath is passed over the guidewire into the left subclavian artery with the tip extending into the aortic arch.  The introducing dilator is removed and the sheath is flushed with heparinized saline   TRANSCATHETER HEART VALVE DEPLOYMENT:   An Edwards Sapien 3 transcatheter heart valve (size 23 mm, model #9600TFX, serial #[id number]) was prepared  and crimped per manufacturer's guidelines, and the proper orientation of the valve is confirmed on the [employer] delivery system. The valve was advanced through the introducer sheath using normal technique until in an appropriate position in the aortic arch beyond the sheath tip. The balloon was then retracted and using the fine-tuning wheel was centered on the valve.  This is performed slowly and requires several iterations where retraction of the valve is stopped to facilitate further advancing of the entire system because of the short distance between the aortic valve and the subclavian artery.  Ultimately the balloon is completely retracted into the valve and position is verified under fluoroscopy.  The valve was then advanced across the aortic valve annulus. The Commander catheter was retracted using normal technique. Once final position of the valve has been confirmed by angiographic assessment, the valve is deployed while temporarily holding ventilation and during rapid ventricular pacing to maintain systolic blood pressure < 50 mmHg and pulse pressure < 10 mmHg. The balloon inflation is held for >3 seconds after reaching full deployment volume. Once the balloon has fully deflated the balloon is retracted into the ascending aorta and valve function is assessed using echocardiography. The patient's hemodynamic recovery following valve deployment is good.  There is felt to be mild paravalvular leak and moderate central aortic insufficiency.  Aortic root injection also suggested presence of moderate aortic insufficiency.  With careful assessment using TEE it appears that 1 of the 3 leaflets of the valve is not moving completely.  Diagnostic pigtail catheter was then utilized to cross the valve to free up the 1 leaflet that was Pinn.  Follow-up imaging reveals no residual central aortic insufficiency and mild paravalvular leak.   The deployment balloon and guidewire are both removed.    AXILLARY SHEATH  REMOVAL AND ARTERIAL CLOSURE:  After the completion of successful valve deployment the axillary artery sheath is removed and the arteriotomy is closed using the previously placed Gore-tex purse-string sutures. Once the repair has been completed protamine was administered to reverse the anticoagulation.  There is a palpable pulse in the distal axillary artery.  The incision is irrigated with saline solution and subsequently closed in multiple layers using absorbable suture.  The skin incision is closed using a subcuticular skin closure.  Protamine is administered to reverse the anticoagulation.   PROCEDURE COMPLETION:   The temporary pacemaker, pigtail catheters and femoral sheaths were removed with manual pressure used for hemostasis.   The patient tolerated the procedure well and is transported to the post anesthesia care unit in stable condition. There were no immediate intraoperative complications. All sponge instrument and needle counts are verified correct at completion of the operation.   No blood products were administered during the operation.  The patient received a total of 95.9 mL of intravenous contrast during the procedure.    Valentina Gu. Roxy Manns MD 08/26/2018 10:04 AM       Rexene Alberts, MD 08/26/2018 10:06 AM

## 2018-08-26 NOTE — Anesthesia Procedure Notes (Signed)
Arterial Line Insertion Start/End6/23/2020 7:27 AM, 08/26/2018 7:28 AM Performed by: Inda Coke, CRNA, CRNA  Preanesthetic checklist: patient identified, IV checked, site marked, risks and benefits discussed, surgical consent, monitors and equipment checked, pre-op evaluation, timeout performed and anesthesia consent Lidocaine 1% used for infiltration Right, radial was placed Catheter size: 20 G Hand hygiene performed  and maximum sterile barriers used  Allen's test indicative of satisfactory collateral circulation Attempts: 1 Procedure performed without using ultrasound guided technique. Ultrasound Notes:anatomy identified Following insertion, dressing applied and Biopatch. Post procedure assessment: normal  Patient tolerated the procedure well with no immediate complications.

## 2018-08-26 NOTE — CV Procedure (Signed)
[facility]  TAVR OPERATIVE NOTE   Date of Procedure:  08/26/2018  Preoperative Diagnosis: Severe Aortic Stenosis   Postoperative Diagnosis: Same   Procedure:    Transcatheter Aortic Valve Replacement - Transfemoral Approach  Edwards Sapien 3 THV (size 23 mm, model # [id number], serial # [id number])   Co-Surgeons:  Lauree Chandler, MD and Valentina Gu. Roxy Manns, MD  Anesthesiologist: Marcie Bal  Echocardiographer:  Meda Coffee  Pre-operative Echo Findings:  Severe aortic stenosis  Severe left ventricular systolic dysfunction  Post-operative Echo Findings:  Trivial paravalvular leak  Severe left ventricular systolic dysfunction  BRIEF CLINICAL NOTE AND INDICATIONS FOR SURGERY  68 yo female with history of CAD (prior stenting of the LM, LAD and circumflex in the setting of dissection), prior RCA stenting, AAA, chronic bronchitis, chronic systolic and diastolic CHF, GERD, HLD, PAD, aortic stenosis and mitral regurgitation, ischemic cardiomyopathy and tobacco abuse who is here today for TAVR. She is followed by Dr. Irish Lack. Recent worsened dyspnea. Known to have moderate aortic stenosis. Recent echo with worsened LV systolic dysfunction with LVEF of 25%. Aortic stenosis is now severe. Mitral regurgitation is severe. Filling pressures elevated on cardiac cath. CAD stable. She was admitted for diuresis.   During the course of the patient's preoperative work up they have been evaluated comprehensively by a multidisciplinary team of specialists coordinated through the [facility] in the [facility].  They have been demonstrated to suffer from symptomatic severe aortic stenosis as noted above. The patient has been counseled extensively as to the relative risks and benefits of all options for the treatment of severe aortic stenosis including long term medical therapy, conventional surgery for aortic valve replacement, and  transcatheter aortic valve replacement.  The patient has been independently evaluated by Dr. Roxy Manns with CT surgery and they are felt to be at high risk for conventional surgical aortic valve replacement. The surgeon indicated the patient would be a poor candidate for conventional surgery. Based upon review of all of the patient's preoperative diagnostic tests they are felt to be candidate for transcatheter aortic valve replacement using the trans-axillary approach as an alternative to high risk conventional surgery.    Following the decision to proceed with transcatheter aortic valve replacement, a discussion has been held regarding what types of management strategies would be attempted intraoperatively in the event of life-threatening complications, including whether or not the patient would be considered a candidate for the use of cardiopulmonary bypass and/or conversion to open sternotomy for attempted surgical intervention.  The patient has been advised of a variety of complications that might develop peculiar to this approach including but not limited to risks of death, stroke, paravalvular leak, aortic dissection or other major vascular complications, aortic annulus rupture, device embolization, cardiac rupture or perforation, acute myocardial infarction, arrhythmia, heart block or bradycardia requiring permanent pacemaker placement, congestive heart failure, respiratory failure, renal failure, pneumonia, infection, other late complications related to structural valve deterioration or migration, or other complications that might ultimately cause a temporary or permanent loss of functional independence or other long term morbidity.  The patient provides full informed consent for the procedure as described and all questions were answered preoperatively.   DETAILS OF THE OPERATIVE PROCEDURE  PREPARATION:   The patient is brought to the operating room on the above mentioned date and central monitoring was  established by the anesthesia team including placement of a radial arterial line. The patient is placed in the supine position on the operating  table.  Intravenous antibiotics are administered. General anesthesia is used.   Baseline transesophageal echocardiogram was performed. The patient's chest, abdomen, both groins, and both lower extremities are prepared and draped in a sterile manner. A time out procedure is performed.   PERIPHERAL ACCESS:   Using the modified Seldinger technique, femoral arterial and venous access were obtained with placement of 6 Fr sheaths on the right side.  A pigtail diagnostic catheter was passed through the femoral arterial sheath under fluoroscopic guidance into the aortic root.  A temporary transvenous pacemaker catheter was passed through the femoral venous sheath under fluoroscopic guidance into the right ventricle.  The pacemaker was tested to ensure stable lead placement and pacemaker capture. Aortic root angiography was performed in order to determine the optimal angiographic angle for valve deployment.  TRANSAXILLARY ACCESS: FEMORAL ACCESS: Please see the surgical note by Dr. Roxy Manns for full details of the access into the left axillary artery. The patient was anti-coagulated systemically and ACT verified > 250 seconds.    A 14 Fr transfemoral E-sheath was introduced into the left axillary artery. A JR4 catheter was used to direct a straight-tip exchange length wire across the native aortic valve into the left ventricle. This was exchanged out for a pigtail catheter and position was confirmed in the LV apex.Simultaneous LV and Ao pressures were recorded.    TRANSCATHETER HEART VALVE DEPLOYMENT:  An Edwards Sapien 3 THV (size 23 mm) was prepared and crimped per manufacturer's guidelines, and the proper orientation of the valve is confirmed on the [employer] delivery system. The valve was advanced through the introducer sheath using normal technique until in an  appropriate position in the abdominal aorta beyond the sheath tip. The balloon was then retracted and using the fine-tuning wheel was centered on the valve. The valve was then advanced across the aortic arch using appropriate flexion of the catheter. The valve was carefully positioned across the aortic valve annulus. The Commander catheter was retracted using normal technique. Once final position of the valve has been confirmed by angiographic assessment, the valve is deployed while temporarily holding ventilation and during rapid ventricular pacing to maintain systolic blood pressure < 50 mmHg and pulse pressure < 10 mmHg. The balloon inflation is held for >3 seconds after reaching full deployment volume. Once the balloon has fully deflated the balloon is retracted into the ascending aorta and valve function is assessed using TEE. There is felt to be trivial paravalvular leak and no central aortic insufficiency.  The patient's hemodynamic recovery following valve deployment is good.  The deployment balloon and guidewire are both removed. Echo demostrated acceptable post-procedural gradients, stable mitral valve function, and trivial AI.   PROCEDURE COMPLETION:  Please see Dr. Guy Sandifer note for details of sheath removal. Protamine was administered once femoral arterial repair was complete. The temporary pacemaker, pigtail catheters and femoral sheaths were removed with manual pressure used for hemostasis.   The patient tolerated the procedure well and is transported to the surgical intensive care in stable condition. There were no immediate intraoperative complications. All sponge instrument and needle counts are verified correct at completion of the operation.   No blood products were administered during the operation.  The patient received a total of 95.9 mL of intravenous contrast during the procedure.  Lauree Chandler MD 08/26/2018 10:04 AM

## 2018-08-26 NOTE — Progress Notes (Signed)
08/26/2018 1700 Pt walked with minimal assistance on 2LO2 419ft.  Pt tolerated well. [place]

## 2018-08-27 ENCOUNTER — Encounter (HOSPITAL_COMMUNITY): Payer: Self-pay | Admitting: Cardiovascular Disease

## 2018-08-27 ENCOUNTER — Inpatient Hospital Stay (HOSPITAL_COMMUNITY): Payer: PPO

## 2018-08-27 DIAGNOSIS — Z952 Presence of prosthetic heart valve: Secondary | ICD-10-CM

## 2018-08-27 LAB — BASIC METABOLIC PANEL
Anion gap: 7 (ref 5–15)
BUN: 14 mg/dL (ref 8–23)
CO2: 29 mmol/L (ref 22–32)
Calcium: 9.1 mg/dL (ref 8.9–10.3)
Chloride: 96 mmol/L — ABNORMAL LOW (ref 98–111)
Creatinine, Ser: 0.73 mg/dL (ref 0.44–1.00)
GFR calc Af Amer: >60 mL/min (ref >60)
GFR calc non Af Amer: >60 mL/min (ref >60)
Glucose, Bld: 108 mg/dL — ABNORMAL HIGH (ref 70–99)
Potassium: 4.8 mmol/L (ref 3.5–5.1)
Sodium: 132 mmol/L — ABNORMAL LOW (ref 135–145)

## 2018-08-27 LAB — CBC
HCT: 34 % — ABNORMAL LOW (ref 36.0–46.0)
Hemoglobin: 10.3 g/dL — ABNORMAL LOW (ref 12.0–15.0)
MCH: 26.8 pg (ref 26.0–34.0)
MCHC: 30.3 g/dL (ref 30.0–36.0)
MCV: 88.5 fL (ref 80.0–100.0)
Platelets: 226 10*3/uL (ref 150–400)
RBC: 3.84 MIL/uL — ABNORMAL LOW (ref 3.87–5.11)
RDW: 14.9 % (ref 11.5–15.5)
WBC: 7.2 10*3/uL (ref 4.0–10.5)
nRBC: 0 % (ref 0.0–0.2)

## 2018-08-27 LAB — ECHOCARDIOGRAM COMPLETE
Height: 59.5 in
Weight: 1569.6 oz

## 2018-08-27 LAB — MAGNESIUM: Magnesium: 2.2 mg/dL (ref 1.7–2.4)

## 2018-08-27 MED ORDER — FUROSEMIDE 10 MG/ML IJ SOLN
40.0000 mg | Freq: Two times a day (BID) | INTRAMUSCULAR | Status: AC
  Administered 2018-08-27 (×2): 40 mg via INTRAVENOUS
  Filled 2018-08-27 (×2): qty 4

## 2018-08-27 MED ORDER — SPIRONOLACTONE 12.5 MG HALF TABLET
12.5000 mg | ORAL_TABLET | Freq: Every day | ORAL | Status: DC
  Administered 2018-08-27 – 2018-08-28 (×2): 12.5 mg via ORAL
  Filled 2018-08-27 (×2): qty 1

## 2018-08-27 MED ORDER — SIMETHICONE 80 MG PO CHEW: 160.0000 mg | CHEWABLE_TABLET | Freq: Four times a day (QID) | ORAL | Status: DC | PRN

## 2018-08-27 MED ORDER — UMECLIDINIUM BROMIDE 62.5 MCG/INH IN AEPB
1.0000 | INHALATION_SPRAY | Freq: Every day | RESPIRATORY_TRACT | Status: DC
  Administered 2018-08-27 – 2018-08-28 (×2): 1 via RESPIRATORY_TRACT
  Filled 2018-08-27: qty 7

## 2018-08-27 MED ORDER — DIGOXIN 125 MCG PO TABS
0.1250 mg | ORAL_TABLET | Freq: Every day | ORAL | Status: DC
  Administered 2018-08-27 – 2018-08-28 (×2): 0.125 mg via ORAL
  Filled 2018-08-27 (×2): qty 1

## 2018-08-27 MED ORDER — CLOPIDOGREL BISULFATE 75 MG PO TABS
75.0000 mg | ORAL_TABLET | Freq: Every day | ORAL | Status: DC
  Administered 2018-08-27 – 2018-08-28 (×2): 75 mg via ORAL
  Filled 2018-08-27 (×2): qty 1

## 2018-08-27 MED ORDER — FLUTICASONE-UMECLIDIN-VILANT 100-62.5-25 MCG/INH IN AEPB: 1.0000 | INHALATION_SPRAY | Freq: Every day | RESPIRATORY_TRACT | Status: DC

## 2018-08-27 MED ORDER — FLUTICASONE FUROATE-VILANTEROL 100-25 MCG/INH IN AEPB
1.0000 | INHALATION_SPRAY | Freq: Every day | RESPIRATORY_TRACT | Status: DC
  Administered 2018-08-27 – 2018-08-28 (×2): 1 via RESPIRATORY_TRACT
  Filled 2018-08-27: qty 28

## 2018-08-27 MED ORDER — FUROSEMIDE 80 MG PO TABS
80.0000 mg | ORAL_TABLET | Freq: Two times a day (BID) | ORAL | Status: DC
  Administered 2018-08-28 (×2): 80 mg via ORAL
  Filled 2018-08-27 (×2): qty 1

## 2018-08-27 MED ORDER — ALBUTEROL SULFATE (2.5 MG/3ML) 0.083% IN NEBU: 2.5000 mg | INHALATION_SOLUTION | RESPIRATORY_TRACT | Status: DC | PRN

## 2018-08-27 MED FILL — Magnesium Sulfate Inj 50%: INTRAMUSCULAR | Qty: 10 | Status: AC

## 2018-08-27 MED FILL — Potassium Chloride Inj 2 mEq/ML: INTRAVENOUS | Qty: 40 | Status: AC

## 2018-08-27 MED FILL — Heparin Sodium (Porcine) Inj 1000 Unit/ML: INTRAMUSCULAR | Qty: 30 | Status: AC

## 2018-08-27 NOTE — Care Management Important Message (Signed)
Important Message  Patient Details  Name: Teresa [name] MRN: [id number] Date of Birth: [birth year]   Medicare Important Message Given:  Yes     [name] 08/27/2018, 12:42 PM

## 2018-08-27 NOTE — Anesthesia Postprocedure Evaluation (Signed)
Anesthesia Post Note  Patient: Teresa [name]  Procedure(s) Performed: TRANSCATHETER AORTIC VALVE REPLACEMENT,SUBCLAVIAN (N/A Chest) TRANSESOPHAGEAL ECHOCARDIOGRAM (TEE) (N/A )     Patient location during evaluation: ICU Anesthesia Type: General Level of consciousness: awake and alert Pain management: pain level controlled Vital Signs Assessment: post-procedure vital signs reviewed and stable Respiratory status: spontaneous breathing, nonlabored ventilation, respiratory function stable and patient connected to nasal cannula oxygen Cardiovascular status: blood pressure returned to baseline and stable Postop Assessment: no apparent nausea or vomiting Anesthetic complications: no    Last Vitals:  Vitals:   08/27/18 1143 08/27/18 1608  BP: 136/65 (!) 112/56  Pulse: (!) 114 98  Resp: (!) 24 20  Temp: (!) 36.4 C 36.6 C  SpO2: 90% 97%    Last Pain:  Vitals:   08/27/18 1608  TempSrc: Oral  PainSc:                  [name] S

## 2018-08-27 NOTE — Consult Note (Addendum)
Advanced Heart Failure Team Consult Note   Primary Physician: Leeroy Cha, MD PCP-Cardiologist:  Larae Grooms, MD  Reason for Consultation: Heart Failure   HPI:    Teresa [name] is seen today for evaluation of heart failure at the request of Dr Roxy Manns.  Teresa [name] is a 68 year old with a history of CAD (prior stenting of the LM, LAD and circumflex in the setting of dissection), prior RCA stenting, AAA, chronic bronchitis, chronic systolic and diastolic CHF, GERD, HLD, PAD, aortic stenosis and mitral regurgitation, ischemic cardiomyopathy. Lung cancer 2014 requiring RUL,  and tobacco abuse.   In June she saw Dr Melvyn Novas for increased dyspnea. Dyspne was thought to be from severe AS. She was sent back to [facility] and had a ECHO. ECHO showed worsening EF and severe aortic stenosis + severe mitral regurgitation. She was set up for cath in preparation for TAVR.  Presented for pre TAVR cath on 6/19 and was admitted due to volume overload. Diuresed with IV lasix and once optimized she underwent TAVR on 6/23.   Says she feels better today. Less dyspneic. Is diuresing well.   Echo 08/2018 EF 25% , severe aortic stenosis, severe mitral regurgitation.   LHC 08/19/2018 1. Triple vessel CAD.  2. Patent stents in the left main, LAD and Circumflex (placed at the time of dissection) 3. Chronic occlusion small Obtuse marginal branch which fills from right to left collaterals 4. Patent mid to distal RCA stent. The proximal RCA has a moderate non-obstructive stenosis which does not appear to be flow limiting. This is unchanged from her last cath.  5. Severe aortic stenosis (mean gradient 21 mmHg, peak to peak gradient 36 mmHg, AVA 0.71 cm2) 6. Elevated filling pressures c/w acute volume overload (RA 9/14, RV 57/11/12, PA 58/27 mean 39, PCWP 33)  Review of Systems: [y] = yes, [ ]  = no   . General: Weight gain Blue.Reese ]; Weight loss [ ] ; Anorexia [ ] ; Fatigue [ y]; Fever [ ] ; Chills [ ] ;  Weakness [ y]  . Cardiac: Chest pain/pressure [ ] ; Resting SOB [Y ]; Exertional SOB [Y ]; Orthopnea Jazmín.Cullens ]; Pedal Edema [ y]; Palpitations [ ] ; Syncope [ ] ; Presyncope [ ] ; Paroxysmal nocturnal dyspnea[ ]   . Pulmonary: Cough Blue.Reese ]; Wheezing[ ] ; Hemoptysis[ ] ; Sputum [ ] ; Snoring [ ]   . GI: Vomiting[ ] ; Dysphagia[ ] ; Melena[ ] ; Hematochezia [ ] ; Heartburn[ ] ; Abdominal pain [ ] ; Constipation [ ] ; Diarrhea [ ] ; BRBPR [ ]   . GU: Hematuria[ ] ; Dysuria [ ] ; Nocturia[ ]   . Vascular: Pain in legs with walking [ ] ; Pain in feet with lying flat [ ] ; Non-healing sores [ ] ; Stroke [ ] ; TIA [ ] ; Slurred speech [ ] ;  . Neuro: Headaches[ ] ; Vertigo[ ] ; Seizures[ ] ; Paresthesias[ ] ;Blurred vision [ ] ; Diplopia [ ] ; Vision changes [ ]   . Ortho/Skin: Arthritis Blue.Reese ]; Joint pain Blue.Reese ]; Muscle pain [ ] ; Joint swelling [ ] ; Back Pain [ ] ; Rash [ ]   . Psych: Depression[ ] ; Anxiety[ ]   . Heme: Bleeding problems [ ] ; Clotting disorders [ ] ; Anemia [ ]   . Endocrine: Diabetes [ ] ; Thyroid dysfunction[ ]   Home Medications Prior to Admission medications   Medication Sig Start Date End Date Taking? Authorizing Provider  albuterol (PROAIR HFA) 108 (90 Base) MCG/ACT inhaler Inhale 2 puffs into the lungs every 6 (six) hours as needed for wheezing or shortness of breath.   Yes [provider]  albuterol (PROVENTIL) (2.5 MG/3ML) 0.083% nebulizer solution Take 3 mLs (2.5 mg total) by nebulization every 4 (four) hours as needed for wheezing or shortness of breath. 07/07/18  Yes Tanda Rockers, MD  Alirocumab (PRALUENT) 150 MG/ML SOAJ Inject 1 pen into the skin every 14 (fourteen) days. Patient taking differently: Inject 150 mg into the skin every 14 (fourteen) days. Tuesdays. 03/06/18  Yes Jettie Booze, MD  Cholecalciferol 25 MCG (1000 UT) tablet Take 1,000 Units by mouth daily.   Yes [provider]  clopidogrel (PLAVIX) 75 MG tablet TAKE 1 TABLET(75 MG) BY MOUTH DAILY Patient taking differently: Take 75 mg by  mouth daily.  07/02/18  Yes Imogene Burn, PA-C  diltiazem (CARDIZEM CD) 240 MG 24 hr capsule Take 1 capsule (240 mg total) by mouth daily. 07/02/18  Yes Imogene Burn, PA-C  Fluticasone-Umeclidin-Vilant (TRELEGY ELLIPTA) 100-62.5-25 MCG/INH AEPB Inhale 1 puff into the lungs daily. 07/07/18  Yes Tanda Rockers, MD  furosemide (LASIX) 40 MG tablet Take 80 mg by mouth 2 (two) times daily. Morning & lunch   Yes [provider]  Hypromellose (GENTEAL MILD) 0.2 % SOLN Place 2 drops into both eyes 4 (four) times daily as needed (dry eyes).    Yes [provider]  lansoprazole (PREVACID) 30 MG capsule Take 1 capsule (30 mg total) by mouth daily at 12 noon. Patient taking differently: Take 30 mg by mouth daily before breakfast.  07/18/16  Yes Jettie Booze, MD  NASAL SALINE NA Place 1 spray into the nose daily as needed (congestion).    Yes [provider]  nitroGLYCERIN (NITROSTAT) 0.4 MG SL tablet Place 1 tablet (0.4 mg total) under the tongue every 5 (five) minutes as needed for chest pain. 12/03/17  Yes Imogene Burn, PA-C  OXYGEN 2lpm with sleep   Yes [provider]  potassium chloride SA (K-DUR) 20 MEQ tablet Take 1 tablet (20 mEq total) by mouth 2 (two) times daily. Patient taking differently: Take 20 mEq by mouth 2 (two) times daily. Morning & Afternoon 08/13/18  Yes Weaver, Scott T, PA-C  predniSONE (DELTASONE) 10 MG tablet 2 daily until better then 1 daily thereafter Patient taking differently: Take 10-20 mg by mouth daily. Varies based on respiratory issues. 06/23/18  Yes Tanda Rockers, MD  ranitidine (ZANTAC) 150 MG capsule Take 150 mg by mouth at bedtime as needed for heartburn.   Yes [provider]  simethicone (MYLICON) 80 MG chewable tablet Chew 160 mg by mouth as needed for flatulence.    Yes [provider]  Specialty Vitamins Products ([employer] MENOPAUSE SUPPORT PO) Take 1 capsule by mouth daily.   Yes [provider]   fluticasone (FLONASE) 50 MCG/ACT nasal spray Place 1 spray into both nostrils daily as needed (head congestion.).  09/23/14   [provider]  ibuprofen (ADVIL) 200 MG tablet Take 200 mg by mouth every 6 (six) hours as needed.    [provider]  Spacer/Aero-Holding Chambers (AEROCHAMBER MV) inhaler Use as instructed 01/09/18   Lauraine Rinne, NP    Past Medical History: Past Medical History:  Diagnosis Date  . AAA (abdominal aortic aneurysm) ([place])    a. small by cath 2014.  [name] Acute on chronic combined systolic and diastolic heart failure ([place]) 08/19/2018  . Arthritis    "across my hips; buttocks; comes w/the weather" (02/27/2013)  . CAD (coronary artery disease), native coronary artery    a. 10/2012 - rotational atherectomy of  LAD c/b dissection s/p PCI to LAD, LCx, LM. b. 12/2012 - s/p overlapping DES to RCA.  . Cataract    "just the beginnings on the right" (02/27/2013)  . Chronic combined systolic and diastolic CHF (congestive heart failure) ([place])   . COPD (chronic obstructive pulmonary disease) ([place])   . GERD (gastroesophageal reflux disease)   . Hypercholesterolemia   . Ischemic cardiomyopathy    a. EF 45-50%.  . Mass of lung    "small tumor RUL; they are watching it" (02/27/2013)  . Mitral regurgitation   . Peripheral vascular disease, unspecified ([place])   . S/P TAVR (transcatheter aortic valve replacement)    23 mm Edwards Sapien 3 transcatheter heart valve placed via open left transaxillary approach   . Severe aortic stenosis   . Severe mitral regurgitation   . Tachycardia, unspecified   . Tobacco abuse     Past Surgical History: Past Surgical History:  Procedure Laterality Date  . CARDIAC CATHETERIZATION  08/2018  . CORONARY ANGIOPLASTY WITH STENT PLACEMENT  10/2012; 12/08/2012   "7 + 3" (02/27/2013)  . ESOPHAGOGASTRODUODENOSCOPY (EGD) WITH PROPOFOL N/A 01/11/2015   Procedure: ESOPHAGOGASTRODUODENOSCOPY (EGD) WITH PROPOFOL;  Surgeon: Garlan Fair,  MD;  Location: [facility] ENDOS[facility];  Service: Endoscopy;  Laterality: N/A;  . FLEXIBLE BRONCHOSCOPY N/A 03/02/2013   Procedure: FLEXIBLE BRONCHOSCOPY;  Surgeon: Gaye Pollack, MD;  Location: [place];  Service: Thoracic;  Laterality: N/A;  . FLEXIBLE SIGMOIDOSCOPY N/A 01/11/2015   Procedure: FLEXIBLE SIGMOIDOSCOPY;  Surgeon: Garlan Fair, MD;  Location: [facility];  Service: Endoscopy;  Laterality: N/A;  unable to complete colon-prep issues  . GANGLION CYST EXCISION Left 1975   "wrist"  . LEFT HEART CATH AND CORONARY ANGIOGRAPHY N/A 06/29/2016   Procedure: Left Heart Cath and Coronary Angiography;  Surgeon: Jettie Booze, MD;  Location: [place] CV LAB;  Service: Cardiovascular;  Laterality: N/A;  . LEFT HEART CATH AND CORONARY ANGIOGRAPHY N/A 08/19/2018   Procedure: LEFT HEART CATH AND CORONARY ANGIOGRAPHY;  Surgeon: Burnell Blanks, MD;  Location: [place] CV LAB;  Service: Cardiovascular;  Laterality: N/A;  . LEFT HEART CATHETERIZATION WITH CORONARY ANGIOGRAM N/A 12/08/2012   Procedure: LEFT HEART CATHETERIZATION WITH CORONARY ANGIOGRAM;  Surgeon: Jettie Booze, MD;  Location: [facility] CATH LAB;  Service: Cardiovascular;  Laterality: N/A;  . MOUTH SURGERY  2010?   "for bone loss" (06/24/2012)  . PERCUTANEOUS CORONARY STENT INTERVENTION (PCI-S) N/A 10/09/2012   Procedure: PERCUTANEOUS CORONARY STENT INTERVENTION (PCI-S);  Surgeon: Jettie Booze, MD;  Location: [facility] CATH LAB;  Service: Cardiovascular;  Laterality: N/A;  . RIGHT HEART CATH N/A 08/19/2018   Procedure: RIGHT HEART CATH;  Surgeon: Burnell Blanks, MD;  Location: [place] CV LAB;  Service: Cardiovascular;  Laterality: N/A;  . TEE WITHOUT CARDIOVERSION N/A 08/22/2018   Procedure: TRANSESOPHAGEAL ECHOCARDIOGRAM (TEE);  Surgeon: Pixie Casino, MD;  Location: [facility] ENDOSCOPY;  Service: Cardiovascular;  Laterality: N/A;  . TEE WITHOUT CARDIOVERSION N/A 08/26/2018   Procedure: TRANSESOPHAGEAL ECHOCARDIOGRAM (TEE);   Surgeon: Burnell Blanks, MD;  Location: [place];  Service: Open Heart Surgery;  Laterality: N/A;  . THORACOTOMY/LOBECTOMY Right 03/02/2013   Procedure: Right Video Assisted Thoracoscopy/Thoracotomy with upper Lobectomy;  Surgeon: Gaye Pollack, MD;  Location: [facility] OR;  Service: Thoracic;  Laterality: Right;  Right Lung Upper  Lobectomy   . TONSILLECTOMY  1960  . VIDEO BRONCHOSCOPY  09/12/2011   Procedure: VIDEO BRONCHOSCOPY WITH FLUORO;  Surgeon: Tanda Rockers, MD;  Location: [facility];  Service: Cardiopulmonary;  Laterality: Bilateral;    Family History: Family History  Problem Relation Age of Onset  . Alcohol abuse Father   . Lupus Mother   . Other Sister        Degenerative disc disease  . Migraines Sister   . Pulmonary fibrosis Brother   . Heart attack Neg Hx     Social History: Social History   Socioeconomic History  . Marital status: Married    Spouse name: Not on file  . Number of children: 1  . Years of education: Not on file  . Highest education level: Not on file  Occupational History  . 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 Financial resource strain: Not on file  . Food insecurity    Worry: Not on file    Inability: Not on file  . Transportation needs    Medical: Not on file    Non-medical: Not on file  Tobacco Use  . Smoking status: Former Smoker    Packs/day: 0.50    Years: 40.00    Pack years: 20.00    Types: Cigarettes    Quit date: 10/08/2012    Years since quitting: 5.8  . Smokeless tobacco: Never Used  Substance and Sexual Activity  . Alcohol use: No  . Drug use: No  . Sexual activity: Yes  Lifestyle  . Physical activity    Days per week: Not on file    Minutes per session: Not on file  . Stress: Not on file  Relationships  . Social Herbalist on phone: Not on file    Gets together: Not on file    Attends religious service: Not on file    Active member of club or organization: Not on file    Attends meetings  of clubs or organizations: Not on file    Relationship status: Not on file  Other Topics Concern  . Not on file  Social History Narrative  . Not on file    Allergies:  Allergies  Allergen Reactions  . Azithromycin Shortness Of Breath  . Ceclor [Cefaclor] Shortness Of Breath  . Penicillins Other (See Comments)    Unknown from childhood Did it involve swelling of the face/tongue/throat, SOB, or low BP? No Did it involve sudden or severe rash/hives, skin peeling, or any reaction on the inside of your mouth or nose? No Did you need to seek medical attention at a hospital or doctor's office? Yes When did it last happen?Infancy  If all above answers are "NO", may proceed with cephalosporin use.   Sarina Ill [Sulfamethoxazole-Trimethoprim] Shortness Of Breath and Rash  . Doxycycline Swelling and Other (See Comments)    Redness on the face  . Lipitor [Atorvastatin] Other (See Comments)    Severe muscle aches  . Morphine And Related Nausea And Vomiting and Other (See Comments)    Terrible headache  . Simvastatin Other (See Comments)    Severe muscle aches  . Zetia [Ezetimibe] Other (See Comments)    Severe stomach pain  . Adhesive [Tape] Other (See Comments)    Redness and swelling - use paper tape  . Vicodin [Hydrocodone-Acetaminophen] Nausea And Vomiting    Objective:    Vital Signs:   Temp:  [97.5 F (36.4 C)-98 F (36.7 C)] 97.9 F (36.6 C) (06/24 1608) Pulse Rate:  [84-114] 98 (06/24 1608) Resp:  [15-27] 20 (06/24 1608) BP: (112-139)/(56-73) 112/56 (06/24 1608) SpO2:  [90 %-99 %] 97 % (  06/24 1608) Weight:  [44.5 kg] 44.5 kg (06/24 0322) Last BM Date: 08/25/18  Weight change: Filed Weights   08/25/18 0543 08/26/18 0337 08/27/18 0322  Weight: 42.5 kg 42.7 kg 44.5 kg    Intake/Output:   Intake/Output Summary (Last 24 hours) at 08/27/2018 1618 Last data filed at 08/27/2018 1608 Gross per 24 hour  Intake 1200 ml  Output 1700 ml  Net -500 ml      Physical Exam     General:  Elderly  appearing. No resp difficulty HEENT: normal Neck: supple. JVP 9  .  RIJ dressing Carotids 2+ bilat; no bruits. No lymphadenopathy or thyromegaly appreciated. Cor: PMI nondisplaced. Regular rate & rhythm. No rubs, gallops. 3/6 MR  L axillary dressing Lungs: clear with decreased breath sounds throughout Abdomen: soft, nontender, nondistended. No hepatosplenomegaly. No bruits or masses. Good bowel sounds. Extremities: no cyanosis, clubbing, rash, trace edema Neuro: alert & orientedx3, cranial nerves grossly intact. moves all 4 extremities w/o difficulty. Affect pleasant   Telemetry   Sinus Tach 100-110  Personally reviewed   EKG    NSR 99 LVH Personally reviewed   Labs   Basic Metabolic Panel: Recent Labs  Lab 08/23/18 0305 08/24/18 0308 08/25/18 0317 08/26/18 0605 08/26/18 0814 08/26/18 0954 08/26/18 1052 08/26/18 1053 08/27/18 0356  NA 134* 132* 131* 133* 133* 131*  --  132* 132*  K 4.6 4.9 5.8* 4.1 4.0 3.5  --  4.2 4.8  CL 92* 90* 93* 92*  --   --   --   --  96*  CO2 33* 35* 30 32  --   --   --   --  29  GLUCOSE 119* 125* 136* 101* 124* 190*  --   --  108*  BUN 21 21 26* 21  --   --   --   --  14  CREATININE 0.70 0.65 0.98 0.72  --   --  0.50  --  0.73  CALCIUM 8.8* 8.7* 9.0 9.1  --   --   --   --  9.1  MG  --   --   --   --   --   --   --   --  2.2    Liver Function Tests: No results for input(s): AST, ALT, ALKPHOS, BILITOT, PROT, ALBUMIN in the last 168 hours. No results for input(s): LIPASE, AMYLASE in the last 168 hours. No results for input(s): AMMONIA in the last 168 hours.  CBC: Recent Labs  Lab 08/26/18 0605 08/26/18 0814 08/26/18 0954 08/26/18 1053 08/27/18 0356  WBC 5.9  --   --   --  7.2  HGB 11.7* 10.9* 9.9* 11.6* 10.3*  HCT 37.7 32.0* 29.0* 34.0* 34.0*  MCV 87.9  --   --   --  88.5  PLT 232  --   --   --  226    Cardiac Enzymes: No results for input(s): CKTOTAL, CKMB, CKMBINDEX, TROPONINI in the last 168 hours.   BNP: BNP (last 3 results) Recent Labs    10/27/17 1218 08/21/18 1245  BNP 503.8* >4,500.0*    ProBNP (last 3 results) Recent Labs    02/19/18 1635 08/04/18 0927 08/12/18 1408  PROBNP 24,530* >4974.0* 33,156*     CBG: No results for input(s): GLUCAP in the last 168 hours.  Coagulation Studies: No results for input(s): LABPROT, INR in the last 72 hours.   Imaging    No results found.   Medications:  Current Medications: . aspirin  81 mg Oral Daily  . clopidogrel  75 mg Oral Daily  . fluticasone furoate-vilanterol  1 puff Inhalation Daily   And  . umeclidinium bromide  1 puff Inhalation Daily  . furosemide  40 mg Intravenous BID  . [START ON 08/28/2018] furosemide  80 mg Oral BID WC  . sodium chloride flush  3 mL Intravenous Q12H     Infusions: . sodium chloride Stopped (08/27/18 0600)        Assessment/Plan   1. A/C Systolic HF. ICM + valvular heart disease ECHO in June showed reduced EF 25% from previous ECHO with EF 40-45%.  Volume status improving  Continue IV lasix. .  -Add 12.5 mg spiro daily + 0.125 mg digoxin daily  Check BMET in am.   2. Severe AS S/P TAVR on 08/26/2018 On asa + plavix  3. Severe MR On ECHO 08/2018  May need Mitral Clip down the road.   4. CAD Patent stents on LHC 6/19.   5. COPD  - quit smoking 2014 - followed by Dr, Melvyn Novas  6. H/O Lung Cancer  - s/p resection 2014   Length of Stay: 8  Amy Clegg, NP  08/27/2018, 4:18 PM  Advanced Heart Failure Team Pager [phone] (M-F; [place])  Please contact [facility] for night-coverage after hours (4p -7a ) and weekends on [email or web address]  Patient seen and examined with the above-signed Advanced Practice Provider and/or Housestaff. I personally reviewed laboratory data, imaging studies and relevant notes. I independently examined the patient and formulated the important aspects of the plan. I have edited the note to reflect any of my changes or salient points. I have  personally discussed the plan with the patient and/or family.  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   Has subsequently undergone TAVR and IV diuresis this hospitalization and is feeling much better.SBP remains in low 100s. HR in low 100s as well.   On exam  Elderly appearing NAD JVP 9 Cor Regular tachy  3/6 MR Lungs decreased BS throughout. No wheeze Ab soft NT Ext 1+ edema. Warm  Suspect she may have a burned out LV from advanced aortic stenosis now complicated by severe functional MR. She is s/p TAVR this admit. Will need HF optimization and close f/u. IF EF not improving (suspect it may not) will need to consider MitralClip in 2-3 months.   Continue IV lasix today. Switch to po in am. Add digoxin and spiro. Possibly add low-dose losartan prior to d/c if BP tolerates. No b-blocker yet.   Glori Bickers, MD  6:31 PM

## 2018-08-27 NOTE — Plan of Care (Signed)
Care plans reviewed and patient is progressing.  

## 2018-08-27 NOTE — Progress Notes (Signed)
  Echocardiogram 2D Echocardiogram has been performed.  Johny Chess 08/27/2018, 11:54 AM

## 2018-08-27 NOTE — Plan of Care (Signed)
  Problem: Activity: Goal: Risk for activity intolerance will decrease Outcome: Progressing   Problem: Nutrition: Goal: Adequate nutrition will be maintained Outcome: Progressing   Problem: Coping: Goal: Level of anxiety will decrease Outcome: Progressing   Problem: Pain Managment: Goal: General experience of comfort will improve Outcome: Progressing   Problem: Safety: Goal: Ability to remain free from injury will improve Outcome: Progressing   

## 2018-08-27 NOTE — Progress Notes (Addendum)
1 Day Post-Op Procedure(s) (LRB): TRANSCATHETER AORTIC VALVE REPLACEMENT,SUBCLAVIAN (N/A) TRANSESOPHAGEAL ECHOCARDIOGRAM (TEE) (N/A) Subjective: Having some shortness of breath with activity this morning. Otherwise feels well, pain controlled.  Objective: Vital signs in last 24 hours: Temp:  [97.5 F (36.4 C)-98 F (36.7 C)] 97.9 F (36.6 C) (06/24 0820) Pulse Rate:  [84-112] 102 (06/24 0917) Cardiac Rhythm: Sinus tachycardia (06/24 0703) Resp:  [15-30] 20 (06/24 0917) BP: (114-139)/(58-73) 131/65 (06/24 0820) SpO2:  [91 %-99 %] 92 % (06/24 0917) Weight:  [44.5 kg] 44.5 kg (06/24 0322)     Intake/Output from previous day: 06/23 0701 - 06/24 0700 In: 1380 [P.O.:480; I.V.:700; IV Piggyback:200] Out: 325 [Urine:300; Blood:25] Intake/Output this shift: Total I/O In: 220 [P.O.:220] Out: -   General appearance: alert, cooperative and no distress Neurologic: intact Heart: regular rhythm, HR ~110. No murmur. Lungs: Few fine crackles over left base, right lung clear posterior. Extremities: No edema Wound: Incisions covered with dry, clean surgical dressings.  Lab Results: Recent Labs    08/26/18 0605  08/26/18 1053 08/27/18 0356  WBC 5.9  --   --  7.2  HGB 11.7*   < > 11.6* 10.3*  HCT 37.7   < > 34.0* 34.0*  PLT 232  --   --  226   < > = values in this interval not displayed.   BMET:  Recent Labs    08/26/18 0605  08/26/18 0954 08/26/18 1052 08/26/18 1053 08/27/18 0356  NA 133*   < > 131*  --  132* 132*  K 4.1   < > 3.5  --  4.2 4.8  CL 92*  --   --   --   --  96*  CO2 32  --   --   --   --  29  GLUCOSE 101*   < > 190*  --   --  108*  BUN 21  --   --   --   --  14  CREATININE 0.72  --   --  0.50  --  0.73  CALCIUM 9.1  --   --   --   --  9.1   < > = values in this interval not displayed.    PT/INR: No results for input(s): LABPROT, INR in the last 72 hours. ABG    Component Value Date/Time   PHART 7.346 (L) 08/26/2018 1053   HCO3 31.2 (H) 08/26/2018  1053   TCO2 33 (H) 08/26/2018 1053   O2SAT 95.0 08/26/2018 1053   CBG (last 3)  No results for input(s): GLUCAP in the last 72 hours.  Assessment/Plan: S/P Procedure(s) (LRB): TRANSCATHETER AORTIC VALVE REPLACEMENT,SUBCLAVIAN (N/A) TRANSESOPHAGEAL ECHOCARDIOGRAM (TEE) (N/A)  -POD1 TAVR using left axillary artery approach for severe aortic stenosis. She has severe LV systolic dysfunction (pre-- and post-op by Echo).  Making satisfactory recovery.   -Volume excess-2kg net gain from pre-op wt is particularly significant for her given her baseline wt of only 42Kg. Will keep in the hospital today close monitoring and for IV diuretics.  Repeat ECHO today per protocol.  -Advance diet and activity.  -Anticipate discharge in AM   LOS: 8 days    Antony Odea, PA-C [phone] 08/27/2018   I have seen and examined the patient and agree with the assessment and plan as outlined.  Weight up 2 kg in c/w preop with some increased DOE and bibasilar crackles.  Routine ECHO looks okay w/ normal functioning Sapien 3 THV and no significant paravalvular leak.  LV function remains poor and there remains severe MR.  Will Rx with IV lasix for acute exacerbation of chronic combined systolic and diastolic CHF.  Will discuss w/ Dr Irish Lack plans for medical Rx of CHF and follow-up post discharge.  It might be wise to ask the Advanced Heart Failure team to see her prior to hospital discharge.   Rexene Alberts, MD 08/27/2018 1:30 PM

## 2018-08-27 NOTE — Discharge Summary (Signed)
Physician Discharge Summary  Patient ID: Teresa [name] MRN: [id number] DOB/AGE: 66-Jul-1952 66 y.o.  Admit date: 08/19/2018 Discharge date: 08/28/2018  Admission Diagnoses: Acute exacerbation of class IV chronic combined systolic and diastolic heart failure Chronic obstructive pulmonary disease Coronary artery disease post emergent PTCI to left main, LAD, and circumflex in the setting of acute coronary dissection Dyslipidemia Ischemic cardiomyopathy Peripheral arterial disease Gastroesophageal reflux disease Severe aortic stenosis Severe mitral insufficiency History of lung cancer  Discharge Diagnoses:  Principal Problem:   S/P TAVR (transcatheter aortic valve replacement) Active Problems:   COPD GOLD III    Former Smoker   CAD (coronary artery disease), native coronary artery   Mixed hyperlipidemia   Coronary atherosclerosis of native coronary artery   Lung cancer, upper lobe (HCC)   Obesity   Chronic respiratory failure with hypoxia (HCC)   Severe aortic stenosis   Mitral regurgitation   Acute on chronic combined systolic and diastolic heart failure (HCC)   Discharged Condition: stable  History of Present Illness: HPI:  Patient is a 68 year old female with history of aortic stenosis, coronary artery disease, chronic combined systolic and diastolic congestive heart failure, lung cancer status post surgical resection, and severe COPD on home oxygen therapy who was admitted to the hospital with acute exacerbation of chronic combined systolic and diastolic congestive heart failure who has been referred for surgical consultation to discuss treatment options for management of severe aortic stenosis and mitral regurgitation.  Patient's cardiac history dates back to 2014 when she was found to have lung mass consistent with early stage lung cancer.  She was referred for preoperative cardiac evaluation and underwent catheterization demonstrating single-vessel coronary artery  disease involving the right coronary artery.  She underwent PCI and stenting of the right coronary artery and this procedure was complicated by coronary dissection which required stenting of the left main, left anterior descending coronary artery, and left circumflex coronary arteries.  She ultimately recovered and eventually underwent right upper lobectomy by Dr. Cyndia Bent for stage Ib (T2a, N0, M0) poorly differentiated squamous cell carcinoma of the lung in December 2014.  She quit smoking permanently at that time and has been followed regularly ever since by Dr. Earlie Server.  She has remained free of signs of recurrent cancer.  She has been followed by Dr. Melvyn Novas for management of underlying severe COPD and she has remained on home oxygen which she typically uses only at night for helping with sleep.  She did well from a cardiac standpoint until 2018 when she was hospitalized with congestive heart failure.  Echocardiogram performed at that time revealed findings consistent with severe aortic stenosis with mild to moderate left ventricular systolic dysfunction and ejection fraction estimated 45 to 50%.  Peak velocity across the aortic valve at that time was measured 4.5 m/s corresponding to mean transvalvular gradient estimated 52 mmHg with aortic valve area calculated 0.44 cm and DVI reported 0.14.  She underwent diagnostic cardiac catheterization at that time which revealed continued patency of all previous coronary stents and what was felt to be mild aortic stenosis.  She has continued to have problems with shortness of breath ever since.  She was hospitalized again in August 2019 with another episode of acute exacerbation of congestive heart failure.  Echocardiogram performed November 05, 2017 again revealed findings consistent with severe aortic stenosis with peak velocity across the aortic valve measured 4.3 m/s corresponding to mean transvalvular gradient estimated 45 mmHg and aortic valve area calculated 0.69  cm with DVI reported 0.22.  Mild to moderate mitral regurgitation was reported at that time.  Medical therapy was recommended.  Last fall she was seen in the emergency department on 2 occasions for acute exacerbations of shortness of breath and recently she was seen in follow-up by Dr. Melvyn Novas with worsening symptoms of shortness of breath.  Chest x-ray performed at that time revealed vascular congestion and proBNP level was elevated >4974.  A follow-up echocardiogram was requested and performed on August 12, 2018 which revealed severe left ventricular systolic dysfunction with ejection fraction estimated only 25 to 30%.  There was moderate left ventricular chamber enlargement.  There remained severe aortic stenosis with peak velocity across the aortic valve measured 4.8 m/s corresponding to mean transvalvular gradient estimated 47 mmHg and aortic valve area calculated 0.35 cm.  The DVI was reported 0.11.  There was also severe mitral regurgitation.  The patient was scheduled for elective left and right heart catheterization which was performed August 19, 2018 by Dr. Angelena Form.  Catheterization revealed continued patency of the stents in the right coronary artery, left main coronary artery, the left anterior descending coronary artery, and the left circumflex coronary arteries.  There was 50% proximal stenosis of the right coronary artery and 100% occlusion of the third obtuse marginal branch of the left circumflex.  Peak to peak and mean transvalvular gradients across the aortic valve were measured 36 and 21 mmHg, respectively with aortic valve area calculated 0.71 cm.  There was moderate to severe pulmonary hypertension with PA pressures measured 58/27 and mean pulmonary capillary wedge pressure 33 mmHg.  The patient was notably volume overloaded with severely elevated filling pressures and subsequently admitted to the hospital for acute exacerbation of chronic congestive heart failure.  Cardiothoracic surgical  consultation was requested.  Patient is married and lives locally in [place] with her husband.  She has 1 adult son who lives in [place] and is supportive.  The patient has been retired for several years because of chronic shortness of breath, having previously worked as a Psychologist, clinical.  She has a remote history of tobacco abuse but she quit smoking at the time of her lung cancer surgery in 2014.  She has chronic shortness of breath which has been considerably worse over the past year.  She now gets short of breath with very low level activity and she has recently had several episodes of shortness of breath at rest.  She cannot lay flat in bed and she reports episodes of PND.  She denies any symptoms of exertional chest pain or chest tightness.  At the time of admission she had some lower extremity edema which has now resolved.  She denies any history of dizzy spells or syncope.  She denies any recent productive cough.  She reports a chronic dry "hacking" cough.  She denies any fevers.  She has been practicing social distancing and she denies any known exposure to persons with known or suspected COVID-19 infection.  Hospital Course: Ms. Osgood was admitted to the hospital and diuresed aggressively good response in her symptoms.  She was taken to the Cath Lab where left heart catheterization was performed on 08/19/2018.  Please see the detailed report below.  There is essentially no significant progression of coronary disease and her stents were patent.  She was noted to have severe aortic stenosis and mitral insufficiency.  Medical management for her heart failure and coronary disease was continued.  After she had made sufficient improvement with her respiratory status, transesophageal echocardiography was performed  on  08/22/2018 and echo confirmed severe aortic stenosis, severe mitral insufficiency, moderate tricuspid insufficiency, and severe global left ventricular hypokinesis with ejection  fraction estimated at 25 to 30%.  Due to the severity of her illness and multiple comorbidities, Dr. Alvan Dame did not feel she would tolerate an open operative procedure for valve repair or replacement.  Patient for this reason, Dr. Ihor Gully and the cardiology team proceed with evaluation for possible TAVR.  CT angiography of the chest was performed.  Review of all clinical data, Dr. Alvan Dame recommended TAVR.  This was discussed with the patient detail and she elected to proceed with surgery.  On 08/26/2018, transcatheter aortic valve replacement is open left transaxillary approach was accomplished without complication.  And Edwards's APN transcatheter heart valve, size 23 mm, was placed without complication.  Postoperative echo showed mild paravalvular leak.. She remained hemodynamically stable and was transferred to 4 E. progressive care.  On postop day 1, she was awake and alert and ambulatory.  Her weight was approximately 10 kg above her preoperative baseline so decision was made to keep her as an inpatient for aggressive diuresis prior to discharging her. She responded well to Lasix with a net 2+L diuresis and she felt much better.  Echocardiogram was repeated on 08/27/2018 re-demonstrated severe mitral insufficiency. The aortic valve repair was not visualized with color .    Consults: Heart Failure  Significant Diagnostic Studies:  TRANSESOPHAGEAL ECHOCARDIOGRAM (TEE) NOTE  INDICATIONS: aortic stenosis and mitral regurgitation  PROCEDURE:   Informed consent was obtained prior to the procedure. The risks, benefits and alternatives for the procedure were discussed and the patient comprehended these risks.  Risks include, but are not limited to, cough, sore throat, vomiting, nausea, somnolence, esophageal and stomach trauma or perforation, bleeding, low blood pressure, aspiration, pneumonia, infection, trauma to the teeth and death.    After a procedural time-out, the patient was given propofol per  anesthesia.  The patient's heart rate, blood pressure, and oxygen saturation are monitored continuously during the procedure.The oropharynx was anesthetized 2 cetacaine spray.  The transesophageal probe was inserted in the esophagus and stomach without difficulty and multiple views were obtained.  The patient was kept under observation until the patient left the procedure room.  The patient left the procedure room in stable condition.   Agitated microbubble saline contrast was not administered.  COMPLICATIONS:    There were no immediate complications.  Findings:  1. LEFT VENTRICLE: The left ventricular wall thickness is mildly increased.  The left ventricular cavity is moderately dilated in size. Wall motion is severely hypokinetic.  LVEF is 25-30%.  2. RIGHT VENTRICLE:  The right ventricle is mildly hypokinetic without any thrombus or masses.    3. LEFT ATRIUM:  The left atrium is moderately dilated in size without any thrombus or masses.  There is not spontaneous echo contrast ("smoke") in the left atrium consistent with a low flow state.  4. LEFT ATRIAL APPENDAGE:  The left atrial appendage is free of any thrombus or masses. The appendage has single lobes. Pulse doppler indicates high flow in the appendage.  5. ATRIAL SEPTUM:  The atrial septum appears intact and is free of thrombus and/or masses.  There is no evidence for interatrial shunting by color doppler and saline microbubble.  6. RIGHT ATRIUM:  The right atrium is mildly dilated in size without any thrombus or masses.  7. MITRAL VALVE:  The mitral valve is abnormal with moderate calcification of the posterior leaflet and annulus, causing leaflet  tethering. There is severe, posterior/eccentrically directed mitral regurgitation which is wall-hugging. Reversal of flow in the LUPV was noted. Well-visualized in 2D/3D modes. There were no vegetations or stenosis.  8. AORTIC VALVE:  The aortic valve is trileaflet, heavily  calcified and severely stenotic. Mean gradient was 26 mmHg and peak gradient of 61 mmHg. Subgastric views could not be obtained due to distal esophageal narrowing. There is  trivial regurgitation.   9. TRICUSPID VALVE:  The tricuspid valve is normal in structure and function with Moderate regurgitation.  There were no vegetations or stenosis  10.  PULMONIC VALVE:  The pulmonic valve is normal in structure and function with trivial regurgitation.  There were no vegetations or stenosis.   11. AORTIC ARCH, ASCENDING AND DESCENDING AORTA:  There was grade 1 Ron Parker et. Al, 1992) atherosclerosis of the ascending aorta, aortic arch, or proximal descending aorta.  12. PULMONARY VEINS: Anomalous pulmonary venous return was not noted.  13. PERICARDIUM: The pericardium appeared normal and non-thickened.  There is no pericardial effusion.  IMPRESSION:   1. Severe calcific aortic stenosis - mean gradient at least 26 mmHg 2. Severe eccentric mitral regurgitation with calcified/fixed posterior leaflet and annulus 3. Moderate TR - RVSP 32 mmHg + RAP 4. Severe global hypokinesis - LVEF 25-30%  RECOMMENDATIONS:    1. Ideally surgical aortic valve and mitral valve replacement, however, co-morbidities may prohibit this. Further management per structural heart team      EXAM: CT ANGIOGRAPHY CHEST, ABDOMEN AND PELVIS  TECHNIQUE: Multidetector CT imaging through the chest, abdomen and pelvis was performed using the standard protocol during bolus administration of intravenous contrast. Multiplanar reconstructed images and MIPs were obtained and reviewed to evaluate the vascular anatomy.  CONTRAST:  77mL OMNIPAQUE IOHEXOL 350 MG/ML SOLN  COMPARISON:  01/02/2018 chest CT.  02/21/2015 virtual colonoscopy.  FINDINGS: CTA CHEST FINDINGS  Cardiovascular: Moderate cardiomegaly. No significant pericardial effusion/thickening. Left main and 3 vessel coronary atherosclerosis. Thickening and  calcification of the aortic valve. Atherosclerotic nonaneurysmal thoracic aorta. Minimal atherosclerotic calcification is present in the ascending thoracic aorta. Borderline prominent main pulmonary artery (3.3 cm diameter), stable. No central pulmonary emboli.  Mediastinum/Nodes: No discrete thyroid nodules. Unremarkable esophagus. No pathologically enlarged axillary, mediastinal or hilar lymph nodes.  Lungs/Pleura: No pneumothorax. Small bilateral pleural effusions, predominantly sub pulmonic on the right and dependent on the left. Moderate centrilobular and paraseptal emphysema with diffuse bronchial wall thickening. Mild passive atelectasis in the dependent lower lobes bilaterally. No acute consolidative airspace disease, lung masses or significant pulmonary nodules. Patchy subpleural reticulation and ground-glass attenuation throughout both lungs without significant traction bronchiectasis or frank honeycombing.  Musculoskeletal: No aggressive appearing focal osseous lesions. Mild thoracic spondylosis.  CTA ABDOMEN AND PELVIS FINDINGS  Hepatobiliary: Normal liver with no liver mass. Normal gallbladder with no radiopaque cholelithiasis. No biliary ductal dilatation.  Pancreas: Normal, with no mass or duct dilation.  Spleen: Normal size. No mass.  Adrenals/Urinary Tract: Normal adrenals. No hydronephrosis. No contour deforming renal masses. Normal bladder.  Stomach/Bowel: Normal non-distended stomach. Normal caliber small bowel with no small bowel wall thickening. Normal appendix. Moderate colonic diverticulosis, most prominent in the sigmoid colon, with no large bowel wall thickening or significant pericolonic fat stranding.  Vascular/Lymphatic: Atherosclerotic nonaneurysmal abdominal aorta. No pathologically enlarged lymph nodes in the abdomen or pelvis.  Reproductive: Grossly normal uterus.  No adnexal mass.  Other: No pneumoperitoneum, ascites or focal  fluid collection.  Musculoskeletal: No aggressive appearing focal osseous lesions. Minimal lumbar spondylosis.  VASCULAR MEASUREMENTS  PERTINENT TO TAVR:  AORTA:  Minimal Aortic Diameter-6.9 x 6.1 mm (infrarenal abdominal aorta series 15/image 111)  Severity of Aortic Calcification-severe  RIGHT PELVIS:  Right Common Iliac Artery -  Minimal Diameter-4.4 x 2.9 mm  Tortuosity-mild  Calcification-severe  Right External Iliac Artery -  Minimal Diameter-3.6 x 3.4 mm  Tortuosity-mild  Calcification-mild  Right Common Femoral Artery -  Minimal Diameter-3.6 x 1.4 mm  Tortuosity-mild  Calcification-moderate  LEFT PELVIS:  Left Common Iliac Artery -  Minimal Diameter-5.4 x 4.0 mm  Tortuosity-mild  Calcification-severe  Left External Iliac Artery -  Minimal Diameter-1.9 x 1.5 mm  Tortuosity-mild  Calcification-moderate  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  Left Common Femoral Artery -  Minimal Diameter-3.0 x 2.3 mm  Tortuosity-mild  Calcification-severe  Review of the MIP images confirms the above findings.  IMPRESSION: 1. Vascular findings and measurements pertinent to potential TAVR procedure, as detailed. Of note, iliofemoral arteries are diminutive bilaterally and there are multifocal severe atherosclerotic stenoses in the infrarenal abdominal aorta and bilateral iliofemoral systems as detailed. 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 2. Thickening and calcification of the aortic valve, compatible with the reported clinical history of severe symptomatic aortic stenosis. 3. Moderate cardiomegaly. Left main and 3 vessel coronary atherosclerosis. 4. Aortic Atherosclerosis (ICD10-I70.0) and Emphysema (ICD10-J43.9). 5. Small bilateral pleural effusions. Nonspecific pulmonary fibrosis without acute pulmonary disease. 6.  Moderate colonic diverticulosis.   Electronically Signed   By: Ilona Sorrel M.D.   On: 08/23/2018 10:33   ADDENDUM REPORT: 08/25/2018 12:08  CLINICAL DATA:  68 year old female with severe aortic stenosis being evaluated for a TAVR procedure.  EXAM: Cardiac TAVR CT  TECHNIQUE: The patient was scanned on a [employer]. A 120 kV retrospective scan was triggered in the descending thoracic aorta at 111 HU's. Gantry rotation speed was 250 msecs and collimation was .6 mm. No beta blockade or nitro were given. The 3D data set was reconstructed in 5% intervals of the R-R cycle. Systolic and diastolic phases were analyzed on a dedicated work station using MPR, MIP and VRT modes. The patient received 80 cc of contrast.  FINDINGS: Aortic Valve: Trileaflet aortic valve with severely thickened and moderately calcified leaflets with severely restricted leaflet opening and no calcifications extending into the LVOT.  Aorta: Normal size with minimal calcifications in the ascending aorta, but moderate to severe atheroma and calcifications in the aortic arch. No dissection.  Sinotubular Junction: 26 x 23 mm  Ascending Thoracic Aorta: 28 x 28 mm  Aortic Arch: 21 x 21 mm  Descending Thoracic Aorta: 22 x 20 mm  Sinus of Valsalva Measurements:  Non-coronary: 25 mm  Right -coronary: 24 mm  Left -coronary: 27 mm  Coronary Artery Height above Annulus:  Left Main: 9.7 mm  Right Coronary: 11 mm  Virtual Basal Annulus Measurements:  Maximum/Minimum Diameter: 23.5 x 19.9 mm  Mean Diameter: 21.0 mm  Perimeter: 68 mm  Area: 346 mm2  Optimum Fluoroscopic Angle for Delivery:  LAO 5 CAU 8.  IMPRESSION: 1. Trileaflet aortic valve with severely thickened and moderately calcified leaflets with severely restricted leaflet opening and no calcifications extending into the LVOT. Annular measurements suitable for delivery of a 23 mm Edwards-SAPIEN 3  valve.  2. Aortic valve calcium score 2131 consistent with severe aortic stenosis.  3. Sufficient coronary to annulus distance.  4. Optimum Fluoroscopic Angle for Delivery: LAO 5 CAU 8.  5. No thrombus  in the left atrial appendage.  6. Mildly dilated pulmonary artery measuring 31 mm.   Electronically Signed   By: Ena Dawley   On: 08/25/2018 12:08     ECHOCARDIOGRAM REPORT   08/27/2018, Post TAVR     Patient Name:   Enid Baas Date of Exam: 08/27/2018 Medical Rec #:  [id number]        Height:       59.5 in Accession #:    [id number]       Weight:       98.1 lb Date of Birth:  [birth year]       BSA:          1.37 m Patient Age:    70 years         BP:           131/65 mmHg Patient Gender: F                HR:           113 bpm. Exam Location:  Inpatient    Procedure: 2D Echo  Indications:    post TAVR evaluation   History:        Patient has prior history of Echocardiogram examinations, most                 recent 08/26/2018. CAD COPD mitral regurgitation Signs/Symptoms:                 Murmur Risk Factors: Current Smoker and Dyslipidemia.   Sonographer:    Johny Chess Referring Phys: [id number] [place]    1. The left ventricle has severely reduced systolic function, with an ejection fraction of 20-25%. The cavity size was mildly dilated. Left ventricular diastolic Doppler parameters are consistent with pseudonormalization. Left ventricular diffuse  hypokinesis.  2. Left atrial size was moderately dilated.  3. Trivial pericardial effusion is present.  4. The mitral valve is degenerative. There is mild mitral annular calcification present. Mitral valve regurgitation is severe by color flow Doppler. There is restriction of the posterior mitral leaflet with posteriorly-directed mitral regurgitation.  Possible infarct-related mitral regurgitation. No evidence of mitral valve stenosis.  5. Tricuspid valve regurgitation  is mild-moderate.  6. Bioprosthetic aortic valve s/p TAVR. Mean gradient 10 mmHg, no significant stenosis. No significant regurgitation noted.  7. The right ventricle has moderately reduced systolic function. The cavity was normal. There is no increase in right ventricular wall thickness.  8. The inferior vena cava was normal in size with <50% respiratory variability. PA systolic pressure 53 mmHg.  FINDINGS  Left Ventricle: The left ventricle has severely reduced systolic function, with an ejection fraction of 20-25%. The cavity size was mildly dilated. There is no increase in left ventricular wall thickness. Left ventricular diastolic Doppler parameters  are consistent with pseudonormalization. Left ventricular diffuse hypokinesis.  Right Ventricle: The right ventricle has moderately reduced systolic function. The cavity was normal. There is no increase in right ventricular wall thickness.  Left Atrium: Left atrial size was moderately dilated.  Right Atrium: Right atrial size was normal in size.  Interatrial Septum: No atrial level shunt detected by color flow Doppler.  Pericardium: Trivial pericardial effusion is present.  Mitral Valve: The mitral valve is degenerative in appearance. There is mild mitral annular calcification present. Mitral valve regurgitation is severe by color flow Doppler. No evidence of mitral valve stenosis.  Tricuspid Valve: The tricuspid valve is normal in structure. Tricuspid valve regurgitation is  mild-moderate by color flow Doppler.  Aortic Valve: The aortic valve has been repaired/replaced Aortic valve regurgitation was not visualized by color flow Doppler. Bioprosthetic aortic valve s/p TAVR.  Pulmonic Valve: The pulmonic valve was normal in structure. Pulmonic valve regurgitation is not visualized by color flow Doppler. No evidence of pulmonic stenosis.  Venous: The inferior vena cava is normal in size with less than 50% respiratory  variability.    +--------------+--------++ LEFT VENTRICLE         +---------------+---------++ +--------------+--------++ Diastology               PLAX 2D                +---------------+---------++ +--------------+--------++ LV e' medial:  3.16 cm/s LVIDd:        5.57 cm  +---------------+---------++ +--------------+--------++ LV E/e' medial:42.7      LVIDs:        4.72 cm  +---------------+---------++ +--------------+--------++ LV PW:        1.03 cm  +--------------+--------++ LV IVS:       0.82 cm  +--------------+--------++ LVOT diam:    1.40 cm  +--------------+--------++ LV SV:        48 ml    +--------------+--------++ LV SV Index:  35.44    +--------------+--------++ LVOT Area:    1.54 cm +--------------+--------++                        +--------------+--------++  +---------------+---------++ RIGHT VENTRICLE          +---------------+---------++ RV S prime:    9.48 cm/s +---------------+---------++ TAPSE (M-mode):1.3 cm    +---------------+---------++ RVSP:          53.2 mmHg +---------------+---------++  +---------------+-------++-----------++ LEFT ATRIUM           Index       +---------------+-------++-----------++ LA diam:       4.60 cm3.36 cm/m  +---------------+-------++-----------++ LA Vol (A2C):  72.6 ml52.97 ml/m +---------------+-------++-----------++ LA Vol (A4C):  61.8 ml45.09 ml/m +---------------+-------++-----------++ LA Biplane Vol:68.7 ml50.13 ml/m +---------------+-------++-----------++ +------------+---------++-----------++ RIGHT ATRIUM         Index       +------------+---------++-----------++ RA Pressure:8.00 mmHg            +------------+---------++-----------++ RA Area:    15.60 cm            +------------+---------++-----------++ RA Volume:  42.10 ml 30.72  ml/m +------------+---------++-----------++  +------------------+------------++ AORTIC VALVE                   +------------------+------------++ AV Area (Vmax):   1.01 cm     +------------------+------------++ AV Area (Vmean):  0.95 cm     +------------------+------------++ AV Area (VTI):    0.94 cm     +------------------+------------++ AV Vmax:          227.00 cm/s  +------------------+------------++ AV Vmean:         142.000 cm/s +------------------+------------++ AV VTI:           0.289 m      +------------------+------------++ AV Peak Grad:     20.6 mmHg    +------------------+------------++ AV Mean Grad:     10.0 mmHg    +------------------+------------++ LVOT Vmax:        149.00 cm/s  +------------------+------------++ LVOT Vmean:       87.700 cm/s  +------------------+------------++ LVOT VTI:         0.176 m      +------------------+------------++ LVOT/AV VTI ratio:0.61         +------------------+------------++  +--------------+----------++  +---------------+-----------++  MITRAL VALVE              TRICUSPID VALVE            +--------------+----------++  +---------------+-----------++ MV Area (PHT):7.29 cm    TR Peak grad:  45.2 mmHg   +--------------+----------++  +---------------+-----------++ MV PHT:       30.16 msec  TR Vmax:       336.00 cm/s +--------------+----------++  +---------------+-----------++ MV Decel Time:104 msec    Estimated RAP: 8.00 mmHg   +--------------+----------++  +---------------+-----------++ +---------------+--------++   RVSP:          53.2 mmHg   MR PISA:       1.57 cm   +---------------+-----------++ +---------------+--------++ MR PISA Radius:0.50 cm    +--------------+-------+ +---------------+--------++   SHUNTS                +--------------+-----------++ +--------------+-------+ MV E velocity:135.00 cm/s Systemic VTI:  0.18 m  +--------------+-----------++ +--------------+-------+ MV A velocity:92.00 cm/s  Systemic Diam:1.40 cm +--------------+-----------++ +--------------+-------+ MV E/A ratio: 1.47        +--------------+-----------++    Loralie Champagne MD Electronically signed by Loralie Champagne MD Signature Date/Time: 08/27/2018/4:52:35 PM     Treatments:   Surgery  CARDIOTHORACIC SURGERY OPERATIVE NOTE  Date of Procedure:                08/26/2018  Preoperative Diagnosis:        Severe Aortic Stenosis   Severe Mitral Regurgitation  Postoperative Diagnosis:    Same   Procedure:        Transcatheter Aortic Valve Replacement - Open Left Transaxillary Approach             Edwards Sapien 3 Transcatheter Heart Valve (size 23 mm, model # 9600TFX, serial # [id number])              Co-Surgeons:                        Valentina Gu. Roxy Manns, MD and Lauree Chandler, MD  Anesthesiologist:                  Albertha Ghee, MD  Echocardiographer:              Ena Dawley, MD  Pre-operative Echo Findings: ? Severe aortic stenosis ? Severe mitral regurgitation ? Severe left ventricular systolic dysfunction  Post-operative Echo Findings: ? Mild paravalvular leak ? Unchanged left ventricular systolic function     BRIEF CLINICAL NOTE AND INDICATIONS FOR SURGERY  Patient is a 68 year old female with history of aortic stenosis, coronary artery disease, chronic combined systolic and diastolic congestive heart failure, lung cancer status post surgical resection, and severe COPD on home oxygen therapy who was admitted to the hospital with acute exacerbation of chronic combined systolic and diastolic congestive heart failure who has been referred for surgical consultation to discuss treatment options for management of severe aortic stenosis and mitral regurgitation.  Patient's cardiac history dates back to 2014 when she was found to have lung mass consistent with early stage  lung cancer. She was referred for preoperative cardiac evaluation and underwent catheterization demonstrating single-vessel coronary artery disease involving the right coronary artery. She underwent PCI and stenting of the right coronary artery and this procedure was complicated by coronary dissection which required stenting of the left main, left anterior descending coronary artery, and left circumflex coronary arteries. She ultimately recovered and eventually underwent right upper lobectomy by Dr. 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 She quit smoking permanently at that time and has been followed regularly ever since by Dr. Earlie Server.She has remainedfree of signs of recurrent cancer. She has been followed by Dr. Melvyn Novas for management of underlying severe COPD and she has remained on home oxygen which she typically uses only at night for helping with sleep. She did well from a cardiac standpoint until 2018 when she was hospitalized with congestive heart failure. Echocardiogram performed at that time revealed findings consistent with severe aortic stenosis with mild to moderate left ventricular systolic dysfunction and ejection fraction estimated 45 to 50%. Peak velocity across the aortic valve at that time was measured 4.5 m/s corresponding to mean transvalvular gradient estimated 52 mmHg with aortic valve area calculated 0.44 cm and DVI reported 0.14. She underwent diagnostic cardiac catheterization at that time which revealed continued patency of all previous coronary stents and what was felt to be mild aortic stenosis. She has continued to have problems with shortness of breath ever since. She was hospitalized again in August 2019 with another episode of acute exacerbation of congestive heart failure. Echocardiogram performed November 05, 2017 again revealed findings consistent with severe aortic stenosis with peak velocity  across the aortic valve measured 4.3 m/s corresponding to mean transvalvular gradient estimated 45 mmHg and aortic valve area calculated 0.69 cm with DVI reported 0.22. Mild to moderate mitral regurgitation was reported at that time. Medical therapy was recommended. Last fall she was seen in the emergency department on 2 occasions for acute exacerbations of shortness of breath and recently she was seen in follow-up by Dr. Weldon Picking worsening symptoms of shortness of breath. Chest x-ray performed at that time revealed vascular congestion and proBNP level was elevated >4974. A follow-up echocardiogram was requested and performed on August 12, 2018 which revealed severe left ventricular systolic dysfunction with ejection fraction estimated only 25 to 30%. There was moderate left ventricular chamber enlargement. There remained severe aortic stenosis with peak velocity across the aortic valve measured 4.8 m/s corresponding to mean transvalvular gradient estimated 47 mmHg and aortic valve area calculated 0.35 cm. The DVI was reported 0.11. There was also severe mitral regurgitation.The patient was scheduled for elective left and right heart catheterization which was performed August 19, 2018 by Dr. Angelena Form. Catheterization revealed continued patency of the stents in the right coronary artery, left main coronary artery, the left anterior descending coronary artery, and the left circumflex coronary arteries. There was 50% proximal stenosis of the right coronary artery and 100% occlusion of the third obtuse marginal branch of the left circumflex. Peak to peak and mean transvalvular gradientsacross the aortic valve weremeasured 36 and 21 mmHg,respectively with aortic valve area calculated 0.71 cm. There was moderate to severe pulmonary hypertension with PA pressures measured 58/27 and mean pulmonary capillary wedge pressure 33 mmHg. The patient was notably volume overloaded with severely elevated filling  pressures and subsequently admitted to the hospital for acute exacerbation of chronic congestive heart failure. Cardiothoracic surgical consultation was requested.  During the course of the patient's preoperative work up they have been evaluated comprehensively by a multidisciplinary team of specialists coordinated through the [facility] in the [facility].  They have been demonstrated to suffer from symptomatic severe aortic stenosis and severe mitral regurgitation as noted above. The patient has been counseled extensively as to the relative risks and benefits of all options for the treatment of severe aortic stenosis including long  term medical therapy, conventional surgery for aortic valve replacement, and transcatheter aortic valve replacement.  All questions have been answered, and the patient provides full informed consent for the operation as described.    Discharge Exam: Blood pressure 123/63, pulse 94, temperature 97.6 F (36.4 C), temperature source Oral, resp. rate (!) 24, height 4' 11.5" (1.511 m), weight 42.5 kg, last menstrual period 03/25/2011, SpO2 93 %.  General appearance:alert, cooperative and no distress Neurologic:intact Heart:regular rhythm, HR ~90's. No murmur. Lungs:Breath sounds clear. Extremities:No edema Wound:Incisions covered with dry, clean surgical dressings.  Disposition:   Discharge Instructions    Amb Referral to Cardiac Rehabilitation   Complete by: As directed    [email or web address]   Diagnosis: Valve Replacement   Valve: Aortic Comment - TAVR   After initial evaluation and assessments completed: Virtual Based Care may be provided alone or in conjunction with Phase 2 Cardiac Rehab based on patient barriers.: Yes     Allergies as of 08/28/2018      Reactions   Azithromycin Shortness Of Breath   Ceclor [cefaclor] Shortness Of Breath   Penicillins Other (See Comments)   Unknown from  childhood Did it involve swelling of the face/tongue/throat, SOB, or low BP? No Did it involve sudden or severe rash/hives, skin peeling, or any reaction on the inside of your mouth or nose? No Did you need to seek medical attention at a hospital or doctor's office? Yes When did it last happen?Infancy  If all above answers are "NO", may proceed with cephalosporin use.   Septra [sulfamethoxazole-trimethoprim] Shortness Of Breath, Rash   Doxycycline Swelling, Other (See Comments)   Redness on the face   Lipitor [atorvastatin] Other (See Comments)   Severe muscle aches   Morphine And Related Nausea And Vomiting, Other (See Comments)   Terrible headache   Simvastatin Other (See Comments)   Severe muscle aches   Zetia [ezetimibe] Other (See Comments)   Severe stomach pain   Adhesive [tape] Other (See Comments)   Redness and swelling - use paper tape   Vicodin [hydrocodone-acetaminophen] Nausea And Vomiting      Medication List    STOP taking these medications   Cholecalciferol 25 MCG (1000 UT) tablet   diltiazem 240 MG 24 hr capsule Commonly known as: CARDIZEM CD   potassium chloride SA 20 MEQ tablet Commonly known as: K-DUR     TAKE these medications   AeroChamber MV inhaler Use as instructed   Alirocumab 150 MG/ML Soaj Commonly known as: Praluent Inject 1 pen into the skin every 14 (fourteen) days. What changed:   how much to take  additional instructions   aspirin 81 MG chewable tablet Chew 1 tablet (81 mg total) by mouth daily. Start taking on: August 29, 2018   clopidogrel 75 MG tablet Commonly known as: PLAVIX TAKE 1 TABLET(75 MG) BY MOUTH DAILY What changed:   how much to take  how to take this  when to take this  additional instructions   [employer] MENOPAUSE SUPPORT PO Take 1 capsule by mouth daily.   digoxin 0.125 MG tablet Commonly known as: LANOXIN Take 1 tablet (0.125 mg total) by mouth daily. Start taking on: August 29, 2018   fluticasone 50  MCG/ACT nasal spray Commonly known as: FLONASE Place 1 spray into both nostrils daily as needed (head congestion.).   Fluticasone-Umeclidin-Vilant 100-62.5-25 MCG/INH Aepb Commonly known as: Trelegy Ellipta Inhale 1 puff into the lungs daily.   furosemide 80 MG tablet Commonly known as: LASIX Take 1 tablet (  80 mg total) by mouth 2 (two) times daily with breakfast and lunch. What changed:   medication strength  when to take this  additional instructions   GenTeal Mild 0.2 % Soln Generic drug: Hypromellose Place 2 drops into both eyes 4 (four) times daily as needed (dry eyes).   ibuprofen 200 MG tablet Commonly known as: ADVIL Take 200 mg by mouth every 6 (six) hours as needed.   lansoprazole 30 MG capsule Commonly known as: Prevacid Take 1 capsule (30 mg total) by mouth daily at 12 noon. What changed: when to take this   losartan 25 MG tablet Commonly known as: COZAAR Take 1 tablet (25 mg total) by mouth at bedtime.   NASAL SALINE NA Place 1 spray into the nose daily as needed (congestion).   nitroGLYCERIN 0.4 MG SL tablet Commonly known as: NITROSTAT Place 1 tablet (0.4 mg total) under the tongue every 5 (five) minutes as needed for chest pain.   OXYGEN 2lpm with sleep   predniSONE 10 MG tablet Commonly known as: DELTASONE 2 daily until better then 1 daily thereafter What changed:   how much to take  how to take this  when to take this  additional instructions   ProAir HFA 108 (90 Base) MCG/ACT inhaler Generic drug: albuterol Inhale 2 puffs into the lungs every 6 (six) hours as needed for wheezing or shortness of breath.   albuterol (2.5 MG/3ML) 0.083% nebulizer solution Commonly known as: PROVENTIL Take 3 mLs (2.5 mg total) by nebulization every 4 (four) hours as needed for wheezing or shortness of breath.   ranitidine 150 MG capsule Commonly known as: ZANTAC Take 150 mg by mouth at bedtime as needed for heartburn.   simethicone 80 MG chewable  tablet Commonly known as: MYLICON Chew 2 tablets (160 mg total) by mouth every 6 (six) hours as needed for flatulence. What changed: when to take this   spironolactone 25 MG tablet Commonly known as: ALDACTONE Take 0.5 tablets (12.5 mg total) by mouth daily. Start taking on: August 29, 2018      Follow-up Information    Liliane Shi, [place]. Go on 09/03/2018.   Specialties: Cardiology, Physician Assistant Why: @ 10:15am for in office visit Contact information: [address] [place] [address] [phone]        Eileen Stanford, PA-C. Go on 09/25/2018.   Specialties: Cardiology, Radiology Why: You have a cardiology virtual follow up visit scheduled for Thursday 09/25/18 at 1:30pm. Contact information: [address] [place] [address] [phone]        Chunk[place]. Go on 09/09/2018.   Specialty: Cardiology Why: 11:30 AM, parking code [place] information: [address] [id number] [place] [phone]          Signed: Antony Odea, PA-C 08/28/2018, 12:16 PM

## 2018-08-27 NOTE — Progress Notes (Signed)
CARDIAC REHAB PHASE I   Pt ambulating in hallways this am. States she feels "clearer" today. Pt educated on site care. Encouraged continued IS use and walks. Reviewed CHF, low sodium diet, and exercise guidelines. Will refer to CRP II [place], pt agreeable to Virtual Cardiac Rehab. Will continue to follow.   Pt is interested in participating in Virtual Cardiac Rehab. Pt advised that Virtual Cardiac Rehab is provided at no cost to the patient.  Checklist:  1. Pt has smart device  ie smartphone and/or ipad for downloading an app  Yes 2. Reliable internet/wifi service    Yes 3. Understands how to use their smartphone and navigate within an app.  Yes   Reviewed with pt the scheduling process for virtual cardiac rehab.  Pt verbalized understanding.   [id number] Rufina Falco, RN BSN 08/27/2018 10:31 AM

## 2018-08-28 ENCOUNTER — Other Ambulatory Visit: Payer: Self-pay | Admitting: Physician Assistant

## 2018-08-28 ENCOUNTER — Telehealth: Payer: Self-pay | Admitting: Internal Medicine

## 2018-08-28 LAB — BASIC METABOLIC PANEL
Anion gap: 10 (ref 5–15)
BUN: 18 mg/dL (ref 8–23)
CO2: 32 mmol/L (ref 22–32)
Calcium: 8.9 mg/dL (ref 8.9–10.3)
Chloride: 90 mmol/L — ABNORMAL LOW (ref 98–111)
Creatinine, Ser: 0.52 mg/dL (ref 0.44–1.00)
GFR calc Af Amer: >60 mL/min (ref >60)
GFR calc non Af Amer: >60 mL/min (ref >60)
Glucose, Bld: 96 mg/dL (ref 70–99)
Potassium: 3.1 mmol/L — ABNORMAL LOW (ref 3.5–5.1)
Sodium: 132 mmol/L — ABNORMAL LOW (ref 135–145)

## 2018-08-28 MED ORDER — LOSARTAN POTASSIUM 25 MG PO TABS: 25.0000 mg | ORAL_TABLET | Freq: Every day | ORAL | Status: DC

## 2018-08-28 MED ORDER — SIMETHICONE 80 MG PO CHEW: 160.0000 mg | CHEWABLE_TABLET | Freq: Four times a day (QID) | ORAL | 0 refills | Status: AC | PRN

## 2018-08-28 MED ORDER — FUROSEMIDE 80 MG PO TABS: 80.0000 mg | ORAL_TABLET | Freq: Two times a day (BID) | ORAL | 2 refills | Status: DC

## 2018-08-28 MED ORDER — DIGOXIN 125 MCG PO TABS: 0.1250 mg | ORAL_TABLET | Freq: Every day | ORAL | 1 refills | Status: DC

## 2018-08-28 MED ORDER — SPIRONOLACTONE 25 MG PO TABS: 12.5000 mg | ORAL_TABLET | Freq: Every day | ORAL | 2 refills | Status: DC

## 2018-08-28 MED ORDER — LOSARTAN POTASSIUM 25 MG PO TABS: 25.0000 mg | ORAL_TABLET | Freq: Every day | ORAL | 2 refills | Status: DC

## 2018-08-28 MED ORDER — OXYCODONE-ACETAMINOPHEN 5-325 MG PO TABS: 1.0000 | ORAL_TABLET | ORAL | 0 refills | Status: AC | PRN

## 2018-08-28 MED ORDER — ASPIRIN 81 MG PO CHEW: 81.0000 mg | CHEWABLE_TABLET | Freq: Every day | ORAL | Status: DC

## 2018-08-28 NOTE — Progress Notes (Addendum)
Advanced Heart Failure Rounding Note   Subjective:    Weight down 4.5 pounds overnight. Feels much better. Breathing better. No orthopnea or PND.  SBP 100-115. Labs pending this am.    Objective:   Weight Range:  Vital Signs:   Temp:  [97.5 F (36.4 C)-98.1 F (36.7 C)] 97.7 F (36.5 C) (06/25 0747) Pulse Rate:  [88-114] 94 (06/25 0747) Resp:  [18-27] 18 (06/25 0747) BP: (112-136)/(55-65) 116/62 (06/25 0747) SpO2:  [90 %-98 %] 94 % (06/25 0747) Weight:  [42.5 kg] 42.5 kg (06/25 0400) Last BM Date: 08/25/18  Weight change: Filed Weights   08/26/18 0337 08/27/18 0322 08/28/18 0400  Weight: 42.7 kg 44.5 kg 42.5 kg    Intake/Output:   Intake/Output Summary (Last 24 hours) at 08/28/2018 0806 Last data filed at 08/28/2018 0740 Gross per 24 hour  Intake 1360 ml  Output 3675 ml  Net -2315 ml     Physical Exam: General:  Lying in bed No resp difficulty HEENT: normal Neck: supple. JVP 7 . Carotids 2+ bilat; no bruits. No lymphadenopathy or thryomegaly appreciated. Cor: PMI nondisplaced. Regular rate & rhythm. 2/6 MR Lungs: clear with decreased BS Abdomen: soft, nontender, nondistended. No hepatosplenomegaly. No bruits or masses. Good bowel sounds. Extremities: no cyanosis, clubbing, rash, edema Neuro: alert & orientedx3, cranial nerves grossly intact. moves all 4 extremities w/o difficulty. Affect pleasant  Telemetry: Sinus 90s Personally reviewed   Labs: Basic Metabolic Panel: Recent Labs  Lab 08/23/18 0305 08/24/18 0308 08/25/18 0317 08/26/18 3762 08/26/18 0814 08/26/18 0954 08/26/18 1052 08/26/18 1053 08/27/18 0356  NA 134* 132* 131* 133* 133* 131*  --  132* 132*  K 4.6 4.9 5.8* 4.1 4.0 3.5  --  4.2 4.8  CL 92* 90* 93* 92*  --   --   --   --  96*  CO2 33* 35* 30 32  --   --   --   --  29  GLUCOSE 119* 125* 136* 101* 124* 190*  --   --  108*  BUN 21 21 26* 21  --   --   --   --  14  CREATININE 0.70 0.65 0.98 0.72  --   --  0.50  --  0.73  CALCIUM  8.8* 8.7* 9.0 9.1  --   --   --   --  9.1  MG  --   --   --   --   --   --   --   --  2.2    Liver Function Tests: No results for input(s): AST, ALT, ALKPHOS, BILITOT, PROT, ALBUMIN in the last 168 hours. No results for input(s): LIPASE, AMYLASE in the last 168 hours. No results for input(s): AMMONIA in the last 168 hours.  CBC: Recent Labs  Lab 08/26/18 0605 08/26/18 0814 08/26/18 0954 08/26/18 1053 08/27/18 0356  WBC 5.9  --   --   --  7.2  HGB 11.7* 10.9* 9.9* 11.6* 10.3*  HCT 37.7 32.0* 29.0* 34.0* 34.0*  MCV 87.9  --   --   --  88.5  PLT 232  --   --   --  226    Cardiac Enzymes: No results for input(s): CKTOTAL, CKMB, CKMBINDEX, TROPONINI in the last 168 hours.  BNP: BNP (last 3 results) Recent Labs    10/27/17 1218 08/21/18 1245  BNP 503.8* >4,500.0*    ProBNP (last 3 results) Recent Labs    02/19/18 1635 08/04/18 0927 08/12/18 1408  PROBNP 24,530* >4974.0* 33,156*      Other results:  Imaging: Dg Chest Port 1 View  Result Date: 08/26/2018 CLINICAL DATA:  Status post TAVR EXAM: PORTABLE CHEST 1 VIEW COMPARISON:  12/16/2017 FINDINGS: Bilateral mild chronic interstitial lung disease. Chronic elevation of the right diaphragm. No pleural effusion or pneumothorax. No focal consolidation. Stable cardiomegaly. Interval TAVR. Right jugular central venous catheter with the tip projecting over the SVC. No acute osseous abnormality. IMPRESSION: Interval TAVR. Electronically Signed   By: Kathreen Devoid   On: 08/26/2018 12:19      Medications:     Scheduled Medications: . aspirin  81 mg Oral Daily  . clopidogrel  75 mg Oral Daily  . digoxin  0.125 mg Oral Daily  . fluticasone furoate-vilanterol  1 puff Inhalation Daily   And  . umeclidinium bromide  1 puff Inhalation Daily  . furosemide  80 mg Oral BID WC  . sodium chloride flush  3 mL Intravenous Q12H  . spironolactone  12.5 mg Oral Daily     Infusions: . sodium chloride Stopped (08/27/18 0600)      PRN Medications:  sodium chloride, albuterol, fentaNYL (SUBLIMAZE) injection, ondansetron (ZOFRAN) IV, simethicone, sodium chloride flush   Assessment/Plan:   1. A/C Systolic HF. ICM + valvular heart disease - previous ECHO with EF 40-45%.  - echo this admit EF 20-25% with severe MR - Volume status improving  - Continue spiro and digoxin. No b-blocker yet - Add losartan 25 qhs - Suspect she may have a burned out LV from advanced aortic stenosis now complicated by severe functional MR. She is s/p TAVR this admit. Will need HF optimization and close f/u. IF EF not improving (suspect it may not) will need to consider MitralClip in 2-3 months.    [place] for d/c today from our standpoint  Digoxin 0.125 Spiro 12.5 Losartan 25 qhs Lasix 80 po bid Kdur 20 daily (decreased from 20 bid)  Stop diltiazem  F/u in HF clinic next week with labs and dig level   2. Severe AS -S/P TAVR on 08/26/2018 -On asa + plavix  3. Severe MR - Functional. Hopefully will improve with treatment of HF May need Mitral Clip down the road.   4. CAD - no s/s of ischemia. Patent stents on [facility] 6/19.   5. COPD  - quit smoking 2014 - followed by Dr, Melvyn Novas  6. H/O Lung Cancer  - s/p resection 2014   Length of Stay: 9   Glori Bickers MD 08/28/2018, 8:06 AM  Advanced Heart Failure Team Pager [phone] (M-F; [place])  Please contact [facility] for night-coverage after hours (4p -7a ) and weekends on [email or web address]

## 2018-08-28 NOTE — Progress Notes (Signed)
CARDIAC REHAB PHASE I   PRE:  Rate/Rhythm: 87 SR with PVC    BP: sitting 130/66    SaO2: 97 2L, 93 RA  MODE:  Ambulation: 120 ft, then 470 ft  POST:  Rate/Rhythm: 109 ST    BP: sitting 129/59     SaO2: 90 2L  SATURATION QUALIFICATIONS: (This note is used to comply with regulatory documentation for home oxygen)  Patient Saturations on Room Air at Rest = 93%  Patient Saturations on Room Air while Ambulating = 83%  Patient Saturations on 2 Liters of oxygen while Ambulating = 90%  Please briefly explain why patient needs home oxygen: Pt eager to walk and go home. Sts she wears O2 at night and PRN during the day. Attempted to walk on RA but pt SaO2 down to 83 RA after 60 ft. Returned to room and applied 2L. Pt then able to walk 470 ft fairly independent without c/o. HR up to 110 ST in bathroom after walk (highest). To recliner. Reviewed ed, pt is very versed on HF management.  [place], ACSM 08/28/2018 9:22 AM

## 2018-08-28 NOTE — TOC Transition Note (Signed)
Transition of Care [facility]) - CM/SW Discharge Note Marvetta Gibbons RN, BSN Transitions of Care Unit 4E- RN Case Manager [phone]   Patient Details  Name: SHYLYN YOUNCE MRN: [id number] Date of Birth: 12/22/1950  Transition of Care [facility]) CM/SW Contact:  Dawayne Patricia, RN Phone Number: 08/28/2018, 2:33 PM   Clinical Narrative:    Pt stable for transition home s/p TAVR, pt has home 02 with [place] baseline 2L at night, order placed for continuous 02 needs. CM contacted Zach with [facility] for updated home 02 needs- he will f/u with new orders and process for home needs. Pt has tank to transport home with, husband to provide transportation home.    Final next level of care: Home/Self Care Barriers to Discharge: No Barriers Identified   Patient Goals and CMS Choice Patient states their goals for this hospitalization and ongoing recovery are:: "to return home" CMS Medicare.gov Compare Post Acute Care list provided to:: Patient Choice offered to / list presented to : NA  Discharge Placement  Home                     Discharge Plan and Services                DME Arranged: Oxygen DME Agency: [employer] Date DME Agency Contacted: 08/28/18 Time DME Agency Contacted: [phone] Representative spoke with at DME Agency: [place]: NA [place] Agency: NA        Social Determinants of Health ([place]) Interventions     Readmission Risk Interventions No flowsheet data found.

## 2018-08-28 NOTE — Plan of Care (Signed)
  Problem: Clinical Measurements: Goal: Ability to maintain clinical measurements within normal limits will improve Outcome: Progressing Goal: Will remain free from infection Outcome: Progressing Goal: Diagnostic test results will improve Outcome: Progressing Goal: Respiratory complications will improve Outcome: Progressing Goal: Cardiovascular complication will be avoided Outcome: Progressing   Problem: Education: Goal: Knowledge of General Education information will improve Description: Including pain rating scale, medication(s)/side effects and non-pharmacologic comfort measures Outcome: Completed/Met

## 2018-08-28 NOTE — Telephone Encounter (Signed)
LMTCB. Left message informing patient that we got her message and we would relay this to Dr. Melvyn Novas. Nothing further is needed at this time.

## 2018-08-28 NOTE — Progress Notes (Signed)
D/C instructions given to patient. Medications reviewed. All questions answered. TAVR card sent with patient. IV removed, clean and intact. Husband to escort pt home w/ oxygen.  Clyde Canterbury, RN

## 2018-08-28 NOTE — Progress Notes (Signed)
2 Days Post-Op Procedure(s) (LRB): TRANSCATHETER AORTIC VALVE REPLACEMENT,SUBCLAVIAN (N/A) TRANSESOPHAGEAL ECHOCARDIOGRAM (TEE) (N/A) Subjective: Sitting up eating lunch. Says she feels well with less shortness of breath. She is anxious to return home. Excellent response to Lasix past 24 hours with net 2L diuresis.   Objective: Vital signs in last 24 hours: Temp:  [97.6 F (36.4 C)-98.1 F (36.7 C)] 97.6 F (36.4 C) (06/25 1057) Pulse Rate:  [88-101] 94 (06/25 1057) Cardiac Rhythm: Normal sinus rhythm (06/25 0700) Resp:  [18-24] 24 (06/25 1057) BP: (112-123)/(55-64) 123/63 (06/25 1057) SpO2:  [93 %-98 %] 93 % (06/25 1057) Weight:  [42.5 kg] 42.5 kg (06/25 0400)    Intake/Output from previous day: 06/24 0701 - 06/25 0700 In: 1240 [P.O.:1180; I.V.:60] Out: 3500 [Urine:3500] Intake/Output this shift: Total I/O In: 120 [P.O.:120] Out: 175 [Urine:175]  General appearance: alert, cooperative and no distress Neurologic: intact Heart: regular rhythm, HR ~90's. No murmur. Lungs: Breath sounds clear. Extremities: No edema Wound: Incisions covered with dry, clean surgical dressings.  Lab Results: Recent Labs    08/26/18 0605  08/26/18 1053 08/27/18 0356  WBC 5.9  --   --  7.2  HGB 11.7*   < > 11.6* 10.3*  HCT 37.7   < > 34.0* 34.0*  PLT 232  --   --  226   < > = values in this interval not displayed.   BMET:  Recent Labs    08/26/18 0605  08/26/18 0954 08/26/18 1052 08/26/18 1053 08/27/18 0356  NA 133*   < > 131*  --  132* 132*  K 4.1   < > 3.5  --  4.2 4.8  CL 92*  --   --   --   --  96*  CO2 32  --   --   --   --  29  GLUCOSE 101*   < > 190*  --   --  108*  BUN 21  --   --   --   --  14  CREATININE 0.72  --   --  0.50  --  0.73  CALCIUM 9.1  --   --   --   --  9.1   < > = values in this interval not displayed.    PT/INR: No results for input(s): LABPROT, INR in the last 72 hours. ABG    Component Value Date/Time   PHART 7.346 (L) 08/26/2018 1053   HCO3  31.2 (H) 08/26/2018 1053   TCO2 33 (H) 08/26/2018 1053   O2SAT 95.0 08/26/2018 1053   CBG (last 3)  No results for input(s): GLUCAP in the last 72 hours.  Assessment/Plan: S/P Procedure(s) (LRB): TRANSCATHETER AORTIC VALVE REPLACEMENT,SUBCLAVIAN (N/A) TRANSESOPHAGEAL ECHOCARDIOGRAM (TEE) (N/A)  -POD2 TAVR using left axillary artery approach for severe aortic stenosis. She has severe LV systolic dysfunction (pre-- and post-op by Echo). Continues to progress. Appreciate evaluation and management by the heart failure team.   -Volume excess-2+L diuresis yesterday.  Respiratory status improved.   -Plan discharge today on Rx as recommended by Dr. Jeffie Pollock.   LOS: 9 days    Antony Odea, [place] [phone] 08/28/2018

## 2018-08-28 NOTE — Telephone Encounter (Signed)
Dr Melvyn Novas, Patient sent you an update.  I am still in the hospital. Thank you so very much you did make me well and got things moving very fast. And we're right on the heart. Proud owner of a cow aorta valve. THANK YOU. we will follow up soon  Message routed to Dr Melvyn Novas

## 2018-08-29 ENCOUNTER — Encounter: Payer: Self-pay | Admitting: Thoracic Surgery (Cardiothoracic Vascular Surgery)

## 2018-08-29 ENCOUNTER — Telehealth: Payer: Self-pay

## 2018-08-29 ENCOUNTER — Telehealth (HOSPITAL_COMMUNITY): Payer: Self-pay

## 2018-08-29 NOTE — Telephone Encounter (Signed)
Patient contacted regarding discharge from [facility] on 08/28/2018.  Patient understands to follow up with provider Richardson Dopp PA-C on 09/03/18 at 10:15 AM at [facility] location. Patient understands discharge instructions? yes Patient understands medications and regiment? yes Patient understands to bring all medications to this visit? Yes  The pt is doing well following TAVR.  She has already taken two short walks this morning.  The pt denies any issues with her surgical site at this time and is aware of post surgery restrictions.

## 2018-08-29 NOTE — Telephone Encounter (Signed)
Referral  signed and received from Dr. Irish Lack for this pt to participate in Cardiac Rehab. Due to the departmental closure based on national recommendations for Covid-19 we are unable to provide group exercise at this time. Pt is aware of the closure and the option to participate in virtual cardiac rehab  at no cost to the patient from phase I rehab staff. Option also for hybrid cardiac rehab for in facility visits/session.  Insurance benefits and eligibility determined  will be shared with the patient for any in facility visits/sessions. Follow up appt on 09/03/2018

## 2018-09-01 ENCOUNTER — Other Ambulatory Visit: Payer: Self-pay | Admitting: General Surgery

## 2018-09-01 ENCOUNTER — Ambulatory Visit: Payer: PPO | Admitting: Internal Medicine

## 2018-09-01 ENCOUNTER — Telehealth: Payer: Self-pay | Admitting: Internal Medicine

## 2018-09-01 DIAGNOSIS — J449 Chronic obstructive pulmonary disease, unspecified: Secondary | ICD-10-CM

## 2018-09-01 MED ORDER — INCRUSE ELLIPTA 62.5 MCG/INH IN AEPB: 1.0000 | INHALATION_SPRAY | Freq: Every day | RESPIRATORY_TRACT | 0 refills | Status: DC

## 2018-09-01 MED ORDER — TRELEGY ELLIPTA 100-62.5-25 MCG/INH IN AEPB: 1.0000 | INHALATION_SPRAY | Freq: Every day | RESPIRATORY_TRACT | 0 refills | Status: DC

## 2018-09-01 NOTE — Telephone Encounter (Signed)
Called the patient and made her aware of Dr. Gustavus Bryant response. Patient voiced understanding. Stated her husband will come by to pick it up. Requested he wear a mask. Sample taken to foyer. Nothing further needed at this time.

## 2018-09-01 NOTE — Telephone Encounter (Signed)
Pt was given incruse and breo in hospital because they did not have trelegy.  Pt wants a sample of incruse to finish out the inhalers she has.  Dr Melvyn Novas, is it ok to give her a sample of incruse?  Thank you.

## 2018-09-01 NOTE — Telephone Encounter (Signed)
Yes as long as she understands incruse+ breo = trelegy and doesn't mix and match other than with this formula

## 2018-09-02 ENCOUNTER — Telehealth: Payer: Self-pay | Admitting: Physician Assistant

## 2018-09-02 NOTE — Progress Notes (Signed)
Cardiology Office Note:    Date:  09/03/2018   ID:  Teresa, [name] Jul 26, 1950, MRN [id number]  PCP:  Teresa Cha, MD  Cardiologist:  Teresa Grooms, MD   Electrophysiologist:  None   Referring MD: Teresa [name],*   Chief Complaint  Patient presents with  . Hospitalization Follow-up    s/p TAVR     History of Present Illness:    Teresa [name] is a 68 y.o. female with:  Coronary artery disease   s/p stent to RCA in 2014 c/b dissection >> stenting of LM, LAD, LCx  Aortic stenosis  S/p TAVR 08/2018  Mitral Regurgitation (severe by echo 08/2018)  Lung CA s/p resection   Combined systolic and diastolic CHF  COPD  She was last seen 08/12/2018. She was volume overloaded and a follow up echo demonstrated severely reduced LVF with EF 25-30% and severe AS with mean gradient 47 mmHg and severe MR.  Cardiac Catheterization was arranged and demonstrated patent stents, a chronic occlusion in the OM3 with R-L collaterals and overall stable anatomy.  She was volume overloaded and admitted for diuresis.  She was seen by Dr. Roxy [name] and decision was made to proceed with TAVR.  She underwent successful TAVR on 6/23 with L transaxillary approach.  Post op course was notable for volume overload requiring aggressive diuresis.  Her post op echo showed EF 20-25, severe MR and well functioning AVR.  She was followed by the HF team and her meds were optimized.  It was felt she may need MitralClip if her MR does not improve.    [name] returns for post hospital follow up.  She is here with her son.  She feels so much better.  She feels her breathing is much improved.  She has not had orthopnea, paroxysmal nocturnal dyspnea.  Her leg swelling is much improved.  She has had some incisional pain at times.  She has been walking twice a day without significant difficulty.    Prior CV studies:   The following studies were reviewed today:  Echo 08/27/2018 EF 20-25, Gr 2 DD,  mod LAE, trivial effusion, mild MAC, severe MR, possible infarct related MR, mild to mod TR, s/p TAVR with mean 10 (no sig stenosis, regurgitation), mod reduced RVSF  Carotid US 08/24/2018 Bilat ICA 1-39  Cardiac catheterization 08/19/2018 LM stent patent LCx stent patent, dist 30; OM3 100 with R-L collats RCA prox 50, mid stent patent Recommendations: Medical management of CAD. I think the proximal RCA stenosis is unchanged from her last cath and does not appear to be flow limiting. She has severe AS. Will begin workup for TAVR. I suspect she will be a TAVR candidate although she would be a surgical candidate as well if we leaned toward open surgical AVR. She is volume overloaded today. Will admit for diuresis with IV Lasix.   Echo 08/12/2018 1. Dyskinesis of the left ventricular apex. Hypokinesis of the anterior wall and anterior septum. 2. The left ventricle has severely reduced systolic function, with an ejection fraction of 25-30%. The cavity size was moderately dilated. Left ventricular diastolic Doppler parameters are consistent with pseudonormalization. Elevated mean left atrial  pressure Left ventricular diffuse hypokinesis. 3. The right ventricle has mildly reduced systolic function. The cavity was normal. There is moderately increased right ventricular wall thickness. Right ventricular systolic pressure is moderately elevated with an estimated pressure of 55.3 mmHg. 4. Left atrial size was mild-moderately dilated. 5. Right atrial size was mildly dilated. 6. Mild  thickening of the mitral valve leaflet. There is moderate mitral annular calcification present. Mitral valve regurgitation is severe by color flow Doppler. The MR jet is eccentric laterally directed. No evidence of mitral valve stenosis. 7. The aortic valve is tricuspid. Severely thickening of the aortic valve. Severe calcifcation of the aortic valve. Aortic valve regurgitation is trivial by color flow Doppler. 8. Severe  aortic stenosis: mean gradient 47 mm Hg, calculated AV area 0.35 cm sq. 9. Tricuspid valve regurgitation is mild-moderate. 10. The aortic root and ascending aorta are normal in size and structure. 11. When compared to the prior study: Compared to September 2019, aortic stenosis has worsened, the left ventricle is now dilated and severely depressed and there is marked worsening of mitral regurgitation, which is now severe.   Echo 11/05/17 EF 40-45, ant-sept/ant/apical AK, Gr 2 DD, severe AS (mean 45, peak 47), MAC, mild to mod MR, mild LAE, trivial eff  Echo 07/04/16 EF 45-50, apical AK, Gr 1 DD, severe AS (mean 52, peak 80, MAC  Cardiac Catheterization 06/29/16 LM ost stent patent LAD ost stent patent LCx ost stent patent, prox 20 RCA prox 25, mid stent patent EF 45-50 AV mean 23.9 mmHg (mild AS)   Past Medical History:  Diagnosis Date  . AAA (abdominal aortic aneurysm) ([place])    a. small by cath 2014.  [name] Acute on chronic combined systolic and diastolic heart failure ([place]) 08/19/2018  . Arthritis    "across my hips; buttocks; comes w/the weather" (02/27/2013)  . CAD (coronary artery disease), native coronary artery    a. 10/2012 - rotational atherectomy of LAD c/b dissection s/p PCI to LAD, LCx, LM. b. 12/2012 - s/p overlapping DES to RCA.  . Cataract    "just the beginnings on the right" (02/27/2013)  . Chronic combined systolic and diastolic CHF (congestive heart failure) ([place])   . COPD (chronic obstructive pulmonary disease) ([place])   . GERD (gastroesophageal reflux disease)   . Hypercholesterolemia   . Ischemic cardiomyopathy    a. EF 45-50%.  . Mass of lung    "small tumor RUL; they are watching it" (02/27/2013)  . Mitral regurgitation   . Peripheral vascular disease, unspecified ([place])   . S/P TAVR (transcatheter aortic valve replacement)    23 mm Edwards Sapien 3 transcatheter heart valve placed via open left transaxillary approach   . Severe aortic stenosis   . Severe  mitral regurgitation   . Tachycardia, unspecified   . Tobacco abuse    Surgical Hx: The patient  has a past surgical history that includes Mouth surgery (2010?); Video bronchoscopy (09/12/2011); Tonsillectomy (1960); Ganglion cyst excision (Left, 1975); Coronary angioplasty with stent (10/2012; 12/08/2012); Flexible bronchoscopy (N/A, 03/02/2013); Thoracotomy/lobectomy (Right, 03/02/2013); percutaneous coronary stent intervention (pci-s) (N/A, 10/09/2012); left heart catheterization with coronary angiogram (N/A, 12/08/2012); Esophagogastroduodenoscopy (egd) with propofol (N/A, 01/11/2015); Flexible sigmoidoscopy (N/A, 01/11/2015); LEFT HEART CATH AND CORONARY ANGIOGRAPHY (N/A, 06/29/2016); Cardiac catheterization (08/2018); LEFT HEART CATH AND CORONARY ANGIOGRAPHY (N/A, 08/19/2018); RIGHT HEART CATH (N/A, 08/19/2018); TEE without cardioversion (N/A, 08/22/2018); and TEE without cardioversion (N/A, 08/26/2018).   Current Medications: Current Meds  Medication Sig  . Alirocumab (PRALUENT) 150 MG/ML SOAJ Inject 1 pen into the skin every 14 (fourteen) days. (Patient taking differently: Inject 150 mg into the skin every 14 (fourteen) days. Tuesdays.)  . aspirin 81 MG chewable tablet Chew 1 tablet (81 mg total) by mouth daily.  . clopidogrel (PLAVIX) 75 MG tablet TAKE 1 TABLET(75 MG) BY MOUTH DAILY (Patient taking differently:  Take 75 mg by mouth daily. )  . digoxin (LANOXIN) 0.125 MG tablet Take 1 tablet (0.125 mg total) by mouth daily.  . fluticasone (FLONASE) 50 MCG/ACT nasal spray Place 1 spray into both nostrils daily as needed (head congestion.).   [name] Fluticasone-Umeclidin-Vilant (TRELEGY ELLIPTA) 100-62.5-25 MCG/INH AEPB Inhale 1 puff into the lungs daily.  . furosemide (LASIX) 80 MG tablet Take 1 tablet (80 mg total) by mouth 2 (two) times daily with breakfast and lunch.  . Hypromellose (GENTEAL MILD) 0.2 % SOLN Place 2 drops into both eyes 4 (four) times daily as needed (dry eyes).   [name] ibuprofen (ADVIL) 200  MG tablet Take 200 mg by mouth every 6 (six) hours as needed.  . lansoprazole (PREVACID) 30 MG capsule Take 1 capsule (30 mg total) by mouth daily at 12 noon. (Patient taking differently: Take 30 mg by mouth daily before breakfast. )  . losartan (COZAAR) 25 MG tablet Take 1 tablet (25 mg total) by mouth at bedtime.  [name] NASAL SALINE NA Place 1 spray into the nose daily as needed (congestion).   . nitroGLYCERIN (NITROSTAT) 0.4 MG SL tablet Place 1 tablet (0.4 mg total) under the tongue every 5 (five) minutes as needed for chest pain.  . OXYGEN 2lpm with sleep  . simethicone (MYLICON) 80 MG chewable tablet Chew 2 tablets (160 mg total) by mouth every 6 (six) hours as needed for flatulence.  [name] Spacer/Aero-Holding Chambers (AEROCHAMBER MV) inhaler Use as instructed  . Specialty Vitamins Products ([employer] MENOPAUSE SUPPORT PO) Take 1 capsule by mouth daily.  [name] spironolactone (ALDACTONE) 25 MG tablet Take 0.5 tablets (12.5 mg total) by mouth daily.  [name] umeclidinium bromide (INCRUSE ELLIPTA) 62.5 MCG/INH AEPB Inhale 1 puff into the lungs daily.     Allergies:   Azithromycin, Ceclor [cefaclor], Penicillins, Septra [sulfamethoxazole-trimethoprim], Doxycycline, Lipitor [atorvastatin], Morphine and related, Simvastatin, Zetia [ezetimibe], Adhesive [tape], and Vicodin [hydrocodone-acetaminophen]   Social History   Tobacco Use  . Smoking status: Former Smoker    Packs/day: 0.50    Years: 40.00    Pack years: 20.00    Types: Cigarettes    Quit date: 10/08/2012    Years since quitting: 5.9  . Smokeless tobacco: Never Used  Substance Use Topics  . Alcohol use: No  . Drug use: No     Family Hx: The patient's family history includes Alcohol abuse in her father; Lupus in her mother; Migraines in her sister; Other in her sister; Pulmonary fibrosis in her brother. There is no history of Heart attack.  ROS:   Please see the history of present illness.    ROS All other systems reviewed and are negative.    EKGs/Labs/Other Test Reviewed:    EKG:  EKG is  ordered today.  The ekg ordered today demonstrates normal sinus rhythm, HR 78, normal axis, LVH, non-specific ST-TW changes, Q waves V1-4, QTc 433, similar to prior ECG  Recent Labs: 01/02/2018: ALT 10 08/04/2018: TSH 1.36 08/12/2018: NT-Pro BNP 33,156 08/21/2018: B Natriuretic Peptide >4,500.0 08/27/2018: Hemoglobin 10.3; Magnesium 2.2; Platelets 226 08/28/2018: BUN 18; Creatinine, Ser 0.52; Potassium 3.1; Sodium 132   Recent Lipid Panel Lab Results  Component Value Date/Time   CHOL 152 08/18/2015 08:40 AM   TRIG 105 08/18/2015 08:40 AM   HDL 65 08/18/2015 08:40 AM   CHOLHDL 2.3 08/18/2015 08:40 AM   LDLCALC 66 08/18/2015 08:40 AM    Physical Exam:    VS:  BP 124/74   Pulse 78   Ht 4'  11" (1.499 m)   Wt 90 lb (40.8 kg)   LMP 03/25/2011   BMI 18.18 kg/m     Wt Readings from Last 3 Encounters:  09/03/18 90 lb (40.8 kg)  08/28/18 93 lb 11.2 oz (42.5 kg)  08/12/18 103 lb (46.7 kg)     Physical Exam  Constitutional: She is oriented to person, place, and time. She appears well-developed and well-nourished. No distress.  HENT:  Head: Normocephalic and atraumatic.  Eyes: No scleral icterus.  Neck: No JVD present. No thyromegaly present.  Cardiovascular: Normal rate and regular rhythm.  Murmur heard.  Early systolic murmur is present with a grade of 2/6 at the upper right sternal border and upper left sternal border. Pulmonary/Chest: She has no wheezes. She has no rales.  L transax incision healing, no erythema or d/c  Abdominal: Soft. There is no hepatomegaly.  Musculoskeletal:        General: No edema.  Lymphadenopathy:    She has no cervical adenopathy.  Neurological: She is alert and oriented to person, place, and time.  Skin: Skin is warm and dry.  Psychiatric: She has a normal mood and affect.    ASSESSMENT & PLAN:    1. Aortic valve stenosis, etiology of cardiac valve disease unspecified 2. S/P TAVR (transcatheter  aortic valve replacement) She is progressing well since her TAVR.  Her incision looks good. She has some occasional pains around the incision site but no significant change.  She plans to have dental work in a few months and we discussed the need for SBE prophylaxis.  She is allergic to PCN.    -FU with Kathlene November, PA-C later this month with follow up echo  -Clindamycin 300 mg take 2 (600 mg) 30-60 min before dental appt   -Continue ASA, Plavix  3. Chronic combined systolic and diastolic CHF (congestive heart failure) ([place]) 4. Mitral valve insufficiency, unspecified etiology EF 73-53 with mod diastolic dysfunction.  She has severe MR and this may improve with improved EF.  If not, she may need to undergo MitralClip.  She has follow up next week with [facility].  She is on a good regimen with Losartan, Digoxin, Spironolactone.  She was not placed on beta-blocker in the hospital due to decompensated CHF.  Her volume status seems good and she is feeling great.  She looks good too with flat neck veins, no leg swelling and clear lungs.  I think she can tolerate beta-blocker therapy now.  With her lung dz, I would avoid Coreg.    -Start Bisoprolol 2.5 mg once daily   -Continue Dig, Losartan, Spironolactone  -Check BMET, Dig level today (she did take Dig this AM)  5. Coronary artery disease involving native coronary artery of native heart without angina pectoris Hx of multivessel stenting.  Recent LHC with patent stents, CTO of OM3 treated medically.  No angina.  Continue current medical therapy.     6. Essential hypertension BP is controlled.     Dispo:  Return in about 6 days (around 09/09/2018) for Scheduled Follow Up at the [facility]F Clinic.   Medication Adjustments/Labs and Tests Ordered: Current medicines are reviewed at length with the patient today.  Concerns regarding medicines are outlined above.  Tests Ordered: Orders Placed This Encounter  Procedures  . Basic metabolic panel  . Digoxin level   . EKG 12-Lead   Medication Changes: Meds ordered this encounter  Medications  . DISCONTD: clindamycin (CLEOCIN) 300 MG capsule    Sig: Take 1  capsule (300 mg total) by mouth as needed (30-60 mins before Dental procedure).    Dispense:  2 capsule    Refill:  1  . bisoprolol (ZEBETA) 5 MG tablet    Sig: Take 0.5 tablets (2.5 mg total) by mouth daily.    Dispense:  90 tablet    Refill:  3  . clindamycin (CLEOCIN) 300 MG capsule    Sig: Take 2 capsules (600 mg) 30-60 minutes before dental work.    Dispense:  2 capsule    Refill:  1    Please discontinue previous prescription for clindamycin    Order Specific Question:   Supervising Provider    Answer:   Lelon Perla [[id number]]    Signed, Richardson Dopp, PA-C  09/03/2018 12:40 PM    [facility] [place], [place], [place]  [address] Phone: [phone]; Fax: [phone]

## 2018-09-02 NOTE — Telephone Encounter (Signed)
New Message         COVID-19 Pre-Screening Questions:   In the past 7 to 10 days have you had a cough,  shortness of breath, headache, congestion, fever (100 or greater) body aches, chills, sore throat, or sudden loss of taste or sense of smell? NO  Have you been around anyone with known Covid 19. NO  Have you been around anyone who is awaiting Covid 19 test results in the past 7 to 10 days? NO  Have you been around anyone who has been exposed to Covid 19, or has mentioned symptoms of Covid 19 within the past 7 to 10 days? NO Pts son is needing to bring pt for assistance. She is on Oxygen and he needs to help her move around. Pts son answered NO to call COVID questions     If you have any concerns/questions about symptoms patients report during screening (either on the phone or at threshold). Contact the provider seeing the patient or DOD for further guidance.  If neither are available contact a member of the leadership team.

## 2018-09-03 ENCOUNTER — Other Ambulatory Visit: Payer: Self-pay

## 2018-09-03 ENCOUNTER — Ambulatory Visit (INDEPENDENT_AMBULATORY_CARE_PROVIDER_SITE_OTHER): Payer: PPO | Admitting: Physician Assistant

## 2018-09-03 ENCOUNTER — Encounter: Payer: Self-pay | Admitting: Physician Assistant

## 2018-09-03 VITALS — BP 124/74 | HR 78 | Ht 59.0 in | Wt 90.0 lb

## 2018-09-03 DIAGNOSIS — I340 Nonrheumatic mitral (valve) insufficiency: Secondary | ICD-10-CM | POA: Diagnosis not present

## 2018-09-03 DIAGNOSIS — I5042 Chronic combined systolic (congestive) and diastolic (congestive) heart failure: Secondary | ICD-10-CM | POA: Diagnosis not present

## 2018-09-03 DIAGNOSIS — I10 Essential (primary) hypertension: Secondary | ICD-10-CM

## 2018-09-03 DIAGNOSIS — I350 Nonrheumatic aortic (valve) stenosis: Secondary | ICD-10-CM

## 2018-09-03 DIAGNOSIS — Z952 Presence of prosthetic heart valve: Secondary | ICD-10-CM | POA: Diagnosis not present

## 2018-09-03 DIAGNOSIS — I2510 Atherosclerotic heart disease of native coronary artery without angina pectoris: Secondary | ICD-10-CM

## 2018-09-03 LAB — BASIC METABOLIC PANEL
BUN/Creatinine Ratio: 28 (ref 12–28)
BUN: 16 mg/dL (ref 8–27)
CO2: 32 mmol/L — ABNORMAL HIGH (ref 20–29)
Calcium: 9.4 mg/dL (ref 8.7–10.3)
Chloride: 87 mmol/L — ABNORMAL LOW (ref 96–106)
Creatinine, Ser: 0.58 mg/dL (ref 0.57–1.00)
GFR calc Af Amer: 112 mL/min/{1.73_m2} (ref >59)
GFR calc non Af Amer: 97 mL/min/{1.73_m2} (ref >59)
Glucose: 76 mg/dL (ref 65–99)
Potassium: 3.8 mmol/L (ref 3.5–5.2)
Sodium: 136 mmol/L (ref 134–144)

## 2018-09-03 LAB — DIGOXIN LEVEL: Digoxin, Serum: 1.4 ng/mL — ABNORMAL HIGH (ref 0.5–0.9)

## 2018-09-03 MED ORDER — CLINDAMYCIN HCL 300 MG PO CAPS: 300.0000 mg | ORAL_CAPSULE | ORAL | 1 refills | Status: DC | PRN

## 2018-09-03 MED ORDER — CLINDAMYCIN HCL 300 MG PO CAPS: ORAL_CAPSULE | ORAL | 1 refills | Status: DC

## 2018-09-03 MED ORDER — BISOPROLOL FUMARATE 5 MG PO TABS: 2.5000 mg | ORAL_TABLET | Freq: Every day | ORAL | 3 refills | Status: DC

## 2018-09-03 NOTE — Patient Instructions (Addendum)
Medication Instructions:   Take Cindamycin 600 mg (take 2 of the 300 mg capsules) 30-60 minutes prior to dental procedures  Take Bisoprolol 2.5 mg once a day    If you need a refill on your cardiac medications before your next appointment, please call your pharmacy.   Lab work: BMET  AND DIG TODAY   If you have labs (blood work) drawn today and your tests are completely normal, you will receive your results only by: [name] MyChart Message (if you have MyChart) OR . A paper copy in the mail If you have any lab test that is abnormal or we need to change your treatment, we will call you to review the results.  Testing/Procedures: NONE ORDERED  TODAY    Follow-Up: As Scheduled   Any Other Special Instructions Will Be Listed Below (If Applicable).

## 2018-09-04 ENCOUNTER — Telehealth: Payer: Self-pay | Admitting: *Deleted

## 2018-09-04 ENCOUNTER — Telehealth: Payer: Self-pay | Admitting: Internal Medicine

## 2018-09-04 ENCOUNTER — Telehealth (HOSPITAL_COMMUNITY): Payer: Self-pay | Admitting: Adult Health

## 2018-09-04 MED ORDER — DIGOXIN 125 MCG PO TABS: 0.1250 mg | ORAL_TABLET | ORAL | Status: DC

## 2018-09-04 MED ORDER — INCRUSE ELLIPTA 62.5 MCG/INH IN AEPB: 1.0000 | INHALATION_SPRAY | Freq: Every day | RESPIRATORY_TRACT | 0 refills | Status: AC

## 2018-09-04 NOTE — Telephone Encounter (Signed)
That's fine

## 2018-09-04 NOTE — Telephone Encounter (Signed)
Pt had called 6/29 requesting a sample of Incruse and per Marylyn Ishihara CMA, sample was placed up front for pt 6/29 and her husband was going to come by office to pick it up.  Called and spoke with pt asking if her husband came and picked up the sample of Incruse on 6/29 and she stated that he had. Pt stated when she went to use both the breo and incruse, she realized that the sample of Incruse that we gave her only has enough doses for 7 days and the [facility] has 14 doses. Due to this, pt is wanting to know if she can get another sample of Incruse as she is wanting to use up the [facility] and also do Incruse prior to her going back to [place].  Dr. Melvyn Novas, please advise if you are okay with [place] giving pt another Incruse sample. Thanks!

## 2018-09-04 NOTE — Telephone Encounter (Signed)
Contacted patient and advised sample will be placed at front door for pick up. Patient acknowledged understanding.  Nothing further needed.

## 2018-09-04 NOTE — Telephone Encounter (Signed)
COVID screening completed.  -[facility]

## 2018-09-04 NOTE — Progress Notes (Signed)
Agree with decreasing dig. Will recheck next week

## 2018-09-04 NOTE — Telephone Encounter (Signed)
Spoke with patient and patient aware of results  CHF department notified about labs being drawn on 09-09-18

## 2018-09-09 ENCOUNTER — Other Ambulatory Visit: Payer: Self-pay

## 2018-09-09 ENCOUNTER — Encounter (HOSPITAL_COMMUNITY): Payer: Self-pay

## 2018-09-09 ENCOUNTER — Ambulatory Visit (HOSPITAL_COMMUNITY)
Admit: 2018-09-09 | Discharge: 2018-09-09 | Disposition: A | Discharge status: Home / Self Care | Payer: PPO | Source: Ambulatory Visit | Attending: Cardiology | Admitting: Cardiology

## 2018-09-09 VITALS — BP 106/58 | HR 57 | Wt 92.4 lb

## 2018-09-09 DIAGNOSIS — E7800 Pure hypercholesterolemia, unspecified: Secondary | ICD-10-CM | POA: Diagnosis not present

## 2018-09-09 DIAGNOSIS — Z8249 Family history of ischemic heart disease and other diseases of the circulatory system: Secondary | ICD-10-CM | POA: Insufficient documentation

## 2018-09-09 DIAGNOSIS — I2510 Atherosclerotic heart disease of native coronary artery without angina pectoris: Secondary | ICD-10-CM

## 2018-09-09 DIAGNOSIS — Z7982 Long term (current) use of aspirin: Secondary | ICD-10-CM | POA: Diagnosis not present

## 2018-09-09 DIAGNOSIS — J449 Chronic obstructive pulmonary disease, unspecified: Secondary | ICD-10-CM | POA: Diagnosis not present

## 2018-09-09 DIAGNOSIS — Z79899 Other long term (current) drug therapy: Secondary | ICD-10-CM | POA: Insufficient documentation

## 2018-09-09 DIAGNOSIS — Z7902 Long term (current) use of antithrombotics/antiplatelets: Secondary | ICD-10-CM | POA: Insufficient documentation

## 2018-09-09 DIAGNOSIS — Z85118 Personal history of other malignant neoplasm of bronchus and lung: Secondary | ICD-10-CM | POA: Insufficient documentation

## 2018-09-09 DIAGNOSIS — K219 Gastro-esophageal reflux disease without esophagitis: Secondary | ICD-10-CM | POA: Diagnosis not present

## 2018-09-09 DIAGNOSIS — I5022 Chronic systolic (congestive) heart failure: Secondary | ICD-10-CM

## 2018-09-09 DIAGNOSIS — I5042 Chronic combined systolic (congestive) and diastolic (congestive) heart failure: Secondary | ICD-10-CM

## 2018-09-09 DIAGNOSIS — Z952 Presence of prosthetic heart valve: Secondary | ICD-10-CM | POA: Diagnosis not present

## 2018-09-09 DIAGNOSIS — Z87891 Personal history of nicotine dependence: Secondary | ICD-10-CM | POA: Diagnosis not present

## 2018-09-09 DIAGNOSIS — Z955 Presence of coronary angioplasty implant and graft: Secondary | ICD-10-CM | POA: Insufficient documentation

## 2018-09-09 DIAGNOSIS — Z836 Family history of other diseases of the respiratory system: Secondary | ICD-10-CM | POA: Diagnosis not present

## 2018-09-09 DIAGNOSIS — I255 Ischemic cardiomyopathy: Secondary | ICD-10-CM | POA: Insufficient documentation

## 2018-09-09 LAB — BASIC METABOLIC PANEL
Anion gap: 10 (ref 5–15)
BUN: 17 mg/dL (ref 8–23)
CO2: 33 mmol/L — ABNORMAL HIGH (ref 22–32)
Calcium: 9.4 mg/dL (ref 8.9–10.3)
Chloride: 96 mmol/L — ABNORMAL LOW (ref 98–111)
Creatinine, Ser: 0.67 mg/dL (ref 0.44–1.00)
GFR calc Af Amer: >60 mL/min (ref >60)
GFR calc non Af Amer: >60 mL/min (ref >60)
Glucose, Bld: 89 mg/dL (ref 70–99)
Potassium: 4.6 mmol/L (ref 3.5–5.1)
Sodium: 139 mmol/L (ref 135–145)

## 2018-09-09 LAB — DIGOXIN LEVEL: Digoxin Level: 0.7 ng/mL — ABNORMAL LOW (ref 0.8–2.0)

## 2018-09-09 MED ORDER — CLINDAMYCIN HCL 300 MG PO CAPS: ORAL_CAPSULE | ORAL | 1 refills | Status: DC

## 2018-09-09 NOTE — Patient Instructions (Addendum)
Labs done today. We will call you only if labs are abnormal.  Your physician recommends that you schedule a follow-up appointment in: 3 months with Dr. Haroldine Laws.   At the [facility], you and your health needs are our priority. As part of our continuing mission to provide you with exceptional heart care, we have created designated Provider Care Teams. These Care Teams include your primary Cardiologist (physician) and Advanced Practice Providers (APPs- Physician Assistants and Nurse Practitioners) who all work together to provide you with the care you need, when you need it.   You may see any of the following providers on your designated Care Team at your next follow up: [name] Dr Glori Bickers . Dr Loralie Champagne . Darrick Grinder, NP

## 2018-09-09 NOTE — Progress Notes (Signed)
PCP: Primary Cardiologist: Dr Haroldine Laws   HPI: Teresa [name] is a 68 year old with a history of CAD (prior stenting of the LM, LAD and circumflex in the setting of dissection), prior RCA stenting, AAA, chronic bronchitis, chronic systolic and diastolic CHF, GERD, HLD, PAD, aortic stenosis and mitral regurgitation, ischemic cardiomyopathy. Lung cancer 2014 requiring RUL,  and tobacco abuse.   In June she saw Dr Melvyn Novas for increased dyspnea. Dyspne was thought to be from severe AS. She was sent back to [facility] and had a ECHO. ECHO showed worsening EF and severe aortic stenosis + severe mitral regurgitation. She was set up for cath in preparation for TAVR.Presented for pre TAVR cath on 08/22/18 and was admitted due to volume overload. Diuresed with IV lasix and once optimized she underwent TAVR on 6/23. After TAVR she required additional diuresis and HF medication adjustments.   Today she presents for post hospital follow up with her husband.  Overall feeling great! Denies SOB/PND/Orthopnea. Oxygen saturations have been > 90%. Walking every day. Appetite picking up.  No fever or chills. Weight at home 90 pounds. Taking all medications.  Echo 08/2018 EF 25% , severe aortic stenosis, severe mitral regurgitation.   LHC 08/19/2018 1. Triple vessel CAD.  2. Patent stents in the left main, LAD and Circumflex (placed at the time of dissection) 3. Chronic occlusion small Obtuse marginal branch which fills from right to left collaterals 4. Patent mid to distal RCA stent. The proximal RCA has a moderate non-obstructive stenosis which does not appear to be flow limiting. This is unchanged from her last cath.  5. Severe aortic stenosis (mean gradient 21 mmHg, peak to peak gradient 36 mmHg, AVA 0.71 cm2) 6. Elevated filling pressures c/w acute volume overload (RA 9/14,   ROS: All systems negative except as listed in HPI, PMH and Problem List.  SH:  Social History   Socioeconomic History  . Marital status:  Married    Spouse name: Not on file  . Number of children: 1  . Years of education: Not on file  . Highest education level: Not on file  Occupational History  . 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 Financial resource strain: Not on file  . Food insecurity    Worry: Not on file    Inability: Not on file  . Transportation needs    Medical: Not on file    Non-medical: Not on file  Tobacco Use  . Smoking status: Former Smoker    Packs/day: 0.50    Years: 40.00    Pack years: 20.00    Types: Cigarettes    Quit date: 10/08/2012    Years since quitting: 5.9  . Smokeless tobacco: Never Used  Substance and Sexual Activity  . Alcohol use: No  . Drug use: No  . Sexual activity: Yes  Lifestyle  . Physical activity    Days per week: Not on file    Minutes per session: Not on file  . Stress: Not on file  Relationships  . Social Herbalist on phone: Not on file    Gets together: Not on file    Attends religious service: Not on file    Active member of club or organization: Not on file    Attends meetings of clubs or organizations: Not on file    Relationship status: Not on file  . Intimate partner violence    Fear of current or ex partner: Not on file  Emotionally abused: Not on file    Physically abused: Not on file    Forced sexual activity: Not on file  Other Topics Concern  . Not on file  Social History Narrative  . Not on file    FH:  Family History  Problem Relation Age of Onset  . Alcohol abuse Father   . Lupus Mother   . Other Sister        Degenerative disc disease  . Migraines Sister   . Pulmonary fibrosis Brother   . Heart attack Neg Hx     Past Medical History:  Diagnosis Date  . AAA (abdominal aortic aneurysm) ([place])    a. small by cath 2014.  [name] Acute on chronic combined systolic and diastolic heart failure ([place]) 08/19/2018  . Arthritis    "across my hips; buttocks; comes w/the weather" (02/27/2013)  . CAD (coronary artery  disease), native coronary artery    a. 10/2012 - rotational atherectomy of LAD c/b dissection s/p PCI to LAD, LCx, LM. b. 12/2012 - s/p overlapping DES to RCA.  . Cataract    "just the beginnings on the right" (02/27/2013)  . Chronic combined systolic and diastolic CHF (congestive heart failure) ([place])   . COPD (chronic obstructive pulmonary disease) ([place])   . GERD (gastroesophageal reflux disease)   . Hypercholesterolemia   . Ischemic cardiomyopathy    a. EF 45-50%.  . Mass of lung    "small tumor RUL; they are watching it" (02/27/2013)  . Mitral regurgitation   . Peripheral vascular disease, unspecified ([place])   . S/P TAVR (transcatheter aortic valve replacement)    23 mm Edwards Sapien 3 transcatheter heart valve placed via open left transaxillary approach   . Severe aortic stenosis   . Severe mitral regurgitation   . Tachycardia, unspecified   . Tobacco abuse     Current Outpatient Medications  Medication Sig Dispense Refill  . albuterol (PROAIR HFA) 108 (90 Base) MCG/ACT inhaler Inhale 2 puffs into the lungs every 6 (six) hours as needed for wheezing or shortness of breath.    [name] albuterol (PROVENTIL) (2.5 MG/3ML) 0.083% nebulizer solution Take 3 mLs (2.5 mg total) by nebulization every 4 (four) hours as needed for wheezing or shortness of breath. 75 mL 12  . Alirocumab (PRALUENT) 150 MG/ML SOAJ Inject 1 pen into the skin every 14 (fourteen) days. (Patient taking differently: Inject 150 mg into the skin every 14 (fourteen) days. Tuesdays.) 6 pen 3  . aspirin 81 MG chewable tablet Chew 1 tablet (81 mg total) by mouth daily.    . bisoprolol (ZEBETA) 5 MG tablet Take 0.5 tablets (2.5 mg total) by mouth daily. 90 tablet 3  . clopidogrel (PLAVIX) 75 MG tablet TAKE 1 TABLET(75 MG) BY MOUTH DAILY 90 tablet 3  . digoxin (LANOXIN) 0.125 MG tablet Take 1 tablet (0.125 mg total) by mouth every other day.    . fluticasone (FLONASE) 50 MCG/ACT nasal spray Place 1 spray into both nostrils daily as  needed (head congestion.).   4  . Fluticasone-Umeclidin-Vilant (TRELEGY ELLIPTA) 100-62.5-25 MCG/INH AEPB Inhale 1 puff into the lungs daily. 60 each 11  . furosemide (LASIX) 80 MG tablet Take 1 tablet (80 mg total) by mouth 2 (two) times daily with breakfast and lunch. 30 tablet 2  . Hypromellose (GENTEAL MILD) 0.2 % SOLN Place 2 drops into both eyes 4 (four) times daily as needed (dry eyes).     [name] ibuprofen (ADVIL) 200 MG tablet Take 200 mg by  mouth every 6 (six) hours as needed.    . lansoprazole (PREVACID) 30 MG capsule Take 1 capsule (30 mg total) by mouth daily at 12 noon. (Patient taking differently: Take 30 mg by mouth daily before breakfast. ) 90 capsule 3  . losartan (COZAAR) 25 MG tablet Take 1 tablet (25 mg total) by mouth at bedtime. 30 tablet 2  . NASAL SALINE NA Place 1 spray into the nose daily as needed (congestion).     . OXYGEN 2lpm with sleep    . ranitidine (ZANTAC) 150 MG capsule Take 150 mg by mouth at bedtime as needed for heartburn.    . simethicone (MYLICON) 80 MG chewable tablet Chew 2 tablets (160 mg total) by mouth every 6 (six) hours as needed for flatulence. 30 tablet 0  . Spacer/Aero-Holding Chambers (AEROCHAMBER MV) inhaler Use as instructed 1 each 0  . Specialty Vitamins Products ([employer] MENOPAUSE SUPPORT PO) Take 1 capsule by mouth daily.    [name] spironolactone (ALDACTONE) 25 MG tablet Take 0.5 tablets (12.5 mg total) by mouth daily. 30 tablet 2  . umeclidinium bromide (INCRUSE ELLIPTA) 62.5 MCG/INH AEPB Inhale 1 puff into the lungs daily. 1 each 0  . clindamycin (CLEOCIN) 300 MG capsule Take 2 capsules (600 mg) 30-60 minutes before dental work. (Patient not taking: Reported on 09/09/2018) 2 capsule 1  . nitroGLYCERIN (NITROSTAT) 0.4 MG SL tablet Place 1 tablet (0.4 mg total) under the tongue every 5 (five) minutes as needed for chest pain. (Patient not taking: Reported on 09/09/2018) 25 tablet 4   No current facility-administered medications for this encounter.      Vitals:   09/09/18 1153  BP: (!) 106/58  Pulse: (!) 57  SpO2: 100%  Weight: 41.9 kg (92 lb 6.4 oz)   Wt Readings from Last 3 Encounters:  09/09/18 41.9 kg (92 lb 6.4 oz)  09/03/18 40.8 kg (90 lb)  08/28/18 42.5 kg (93 lb 11.2 oz)    PHYSICAL EXAM: General:  Well appearing. No resp difficulty. Walked in the clinic with 2 liters of oxygen.  HEENT: normal Neck: supple. JVP flat. Carotids 2+ bilaterally; no bruits. No lymphadenopathy or thryomegaly appreciated. Cor: PMI normal. Regular rate & rhythm. No rubs, gallops or murmurs. L upper chest incision approximated.  Lungs: clear Abdomen: soft, nontender, nondistended. No hepatosplenomegaly. No bruits or masses. Good bowel sounds. Extremities: no cyanosis, clubbing, rash, edema Neuro: alert & orientedx3, cranial nerves grossly intact. Moves all 4 extremities w/o difficulty. Affect pleasant.   ASSESSMENT & PLAN: 1. Chronic Systolic HF. ICM + valvular heart disease - previous ECHO with EF 40-45%.  - 08/27/2018 ECHO EF 20-25% with severe MR - NYHA II. Volume status stable. Continue current dose of lasix.  - Continue spiro - Continue digoxin 0.125 mg every other day. Recent dig level high. Check level today. daily.  -  Continue 2.5 mg bisoprolol. No room to increase with pulse 57.  - Continue losartan 25 qhs - Suspect she may have a burned out LV from advanced aortic stenosis now complicated by severe functional MR. She is s/p TAVR this admit. Will need HF optimization and close f/u.  - She has repeat ECHO the end of the month.  IF EF not improving (suspect it may not) will need to consider MitralClip in 2-3 months.   2. Severe AS -S/P TAVR on 08/26/2018- She has follow up the end of the months.  - She may start driving. I personally called the structural team.  -On asa +  plavix  3. Severe MR - Functional. Hopefully will improve with treatment of HF May need Mitral Clip down the road.   4. CAD - No chest pain.   Patent stents  on [facility] 6/19.   5. COPD - quit smoking 2014 - followed by Dr, Melvyn Novas  6. H/O Lung Cancer - s/p resection 2014   Follow up in 3 months with Dr Haroldine Laws.

## 2018-09-12 ENCOUNTER — Telehealth (HOSPITAL_COMMUNITY): Payer: Self-pay

## 2018-09-12 ENCOUNTER — Encounter (HOSPITAL_COMMUNITY): Payer: Self-pay

## 2018-09-12 NOTE — Telephone Encounter (Signed)
Pt insurance is active and benefits verified through [employer]. Co-pay $15.00, DED $0.00/$0.00 met, out of pocket $3,400.00/$284.52 met, co-insurance 0%. No pre-authorization required. Veronica/[employer], 09/12/2018 @ 917AM, FTN#[id number]

## 2018-09-15 DIAGNOSIS — R05 Cough: Secondary | ICD-10-CM | POA: Diagnosis not present

## 2018-09-15 DIAGNOSIS — J9611 Chronic respiratory failure with hypoxia: Secondary | ICD-10-CM | POA: Diagnosis not present

## 2018-09-23 ENCOUNTER — Other Ambulatory Visit: Payer: Self-pay

## 2018-09-23 DIAGNOSIS — Z952 Presence of prosthetic heart valve: Secondary | ICD-10-CM

## 2018-09-24 ENCOUNTER — Ambulatory Visit (HOSPITAL_COMMUNITY): Payer: PPO | Attending: Cardiovascular Disease

## 2018-09-24 ENCOUNTER — Other Ambulatory Visit: Payer: Self-pay

## 2018-09-24 DIAGNOSIS — Z952 Presence of prosthetic heart valve: Secondary | ICD-10-CM | POA: Diagnosis not present

## 2018-09-25 ENCOUNTER — Telehealth: Payer: PPO | Admitting: Physician Assistant

## 2018-09-25 ENCOUNTER — Telehealth (INDEPENDENT_AMBULATORY_CARE_PROVIDER_SITE_OTHER): Payer: PPO | Admitting: Physician Assistant

## 2018-09-25 VITALS — BP 117/37 | HR 58 | Ht 59.0 in | Wt 90.0 lb

## 2018-09-25 DIAGNOSIS — I5042 Chronic combined systolic (congestive) and diastolic (congestive) heart failure: Secondary | ICD-10-CM

## 2018-09-25 DIAGNOSIS — J449 Chronic obstructive pulmonary disease, unspecified: Secondary | ICD-10-CM

## 2018-09-25 DIAGNOSIS — I340 Nonrheumatic mitral (valve) insufficiency: Secondary | ICD-10-CM

## 2018-09-25 DIAGNOSIS — Z952 Presence of prosthetic heart valve: Secondary | ICD-10-CM

## 2018-09-25 DIAGNOSIS — I2510 Atherosclerotic heart disease of native coronary artery without angina pectoris: Secondary | ICD-10-CM

## 2018-09-25 DIAGNOSIS — I5022 Chronic systolic (congestive) heart failure: Secondary | ICD-10-CM | POA: Diagnosis not present

## 2018-09-25 DIAGNOSIS — I110 Hypertensive heart disease with heart failure: Secondary | ICD-10-CM | POA: Diagnosis not present

## 2018-09-25 DIAGNOSIS — I10 Essential (primary) hypertension: Secondary | ICD-10-CM

## 2018-09-25 MED ORDER — FUROSEMIDE 80 MG PO TABS: 80.0000 mg | ORAL_TABLET | Freq: Two times a day (BID) | ORAL | 2 refills | Status: DC

## 2018-09-25 NOTE — Progress Notes (Addendum)
[facility]   MULTIDISCIPLINARY HEART VALVE TEAM     Virtual Visit via Video Note   This visit type was conducted due to national recommendations for restrictions regarding the COVID-19 Pandemic (e.g. social distancing) in an effort to limit this patient's exposure and mitigate transmission in our community.  Due to her co-morbid illnesses, this patient is at least at moderate risk for complications without adequate follow up.  This format is felt to be most appropriate for this patient at this time.  All issues noted in this document were discussed and addressed.  A limited physical exam was performed with this format.  Please refer to the patient's chart for her consent to telehealth for [facility].   Evaluation Performed:  Follow-up visit  Date:  09/25/2018   ID:  Teresa [name], Teresa [name] [birth year], MRN [id number]  Patient Location: Home Provider Location: Office  PCP:  Leeroy Cha, MD  Cardiologist:  Larae Grooms, MD / Dr. Angelena Form & Dr. Roxy Manns (TAVR) Electrophysiologist:  None   Chief Complaint: 1 month s/p TAVR   History of Present Illness:    Teresa [name] is a 68 y.o. female with a history of CAD (prior stenting of the LM, LAD and circumflex in the setting of dissection), prior RCA stenting, AAA, chronic bronchitis, chronic combined S/D CHF, GERD, HLD, PAD, ischemic cardiomyopathy with functional MR, lung cancer 2014 requiring RUL resection, tobacco abuse and severe AS s/p TAVR (08/26/18) who presents for follow up.   The patient does not have symptoms concerning for COVID-19 infection (fever, chills, cough, or new shortness of breath).   In June she saw Dr Melvyn Novas for increased dyspnea. Dyspnea was thought to be from severe AS and she was sent back to [facility]. She was last seen 08/12/2018. She was volume overloaded and a follow up echo demonstrated severely reduced LVF with EF 25-30% and severe AS with mean gradient 47 mmHg and severe MR. Cardiac  Catheterization was arranged and demonstrated patent stents, a chronic occlusion in the OM3 with R-L collaterals and overall stable anatomy. She was volume overloaded and admitted for diuresis. She was seen by Dr. Roxy Manns and decision was made to proceed with TAVR.  She underwent successful TAVR on 6/23 with L transaxillary approach. Post op course was notable for volume overload requiring aggressive diuresis. Her post op echo showed EF 20-25, severe MR and well functioning AVR.  She was followed by the HF team and her meds were optimized with ARB, lasix, and digoxin  It was felt she may need MitralClip if her MR did not improve.    During follow up she reported feeling much improved. Bisoprolol was added and dig level came back elevated and medication adjusted. She was seen in the advanced CHF clinic on 09/09/18 and reportedly doing great. Follow up dig level was slightly subtherapeutic.   1 month echo s/p TAVR on 09/24/18 showed improvement of EF to 30-35%, mild centrally directed MR, normally functioning TAVR with a mean gradient of 17 mm Hg and trivial PVL.   Today she presents via video conferencing for follow up. No CP or SOB. No LE edema, orthopnea or PND. No dizziness or syncope. No blood in stool or urine. No palpitations. Has some mild soreness at incision site. She has been very active "piddlling around" she is walking 45-60 minutes a day . She has lost over 40 lbs with diet and exercise. Wants to know if she can occasionally have fried clams.    Past  Medical History:  Diagnosis Date   AAA (abdominal aortic aneurysm) ([employer])    a. small by cath 2014.   Acute on chronic combined systolic and diastolic heart failure ([place]) 08/19/2018   Arthritis    "across my hips; buttocks; comes w/the weather" (02/27/2013)   CAD (coronary artery disease), native coronary artery    a. 10/2012 - rotational atherectomy of LAD c/b dissection s/p PCI to LAD, LCx, LM. b. 12/2012 - s/p overlapping DES to RCA.    Cataract    "just the beginnings on the right" (02/27/2013)   Chronic combined systolic and diastolic CHF (congestive heart failure) (HCC)    COPD (chronic obstructive pulmonary disease) (HCC)    GERD (gastroesophageal reflux disease)    Hypercholesterolemia    Ischemic cardiomyopathy    a. EF 45-50%.   Mass of lung    "small tumor RUL; they are watching it" (02/27/2013)   Mitral regurgitation    Peripheral vascular disease, unspecified (HCC)    S/P TAVR (transcatheter aortic valve replacement)    23 mm Edwards Sapien 3 transcatheter heart valve placed via open left transaxillary approach    Severe aortic stenosis    Severe mitral regurgitation    Tachycardia, unspecified    Tobacco abuse    Past Surgical History:  Procedure Laterality Date   CARDIAC CATHETERIZATION  08/2018   CORONARY ANGIOPLASTY WITH STENT PLACEMENT  10/2012; 12/08/2012   "7 + 3" (02/27/2013)   ESOPHAGOGASTRODUODENOSCOPY (EGD) WITH PROPOFOL N/A 01/11/2015   Procedure: ESOPHAGOGASTRODUODENOSCOPY (EGD) WITH PROPOFOL;  Surgeon: Garlan Fair, MD;  Location: [facility] ENDOSCOPY;  Service: Endoscopy;  Laterality: N/A;   FLEXIBLE BRONCHOSCOPY N/A 03/02/2013   Procedure: FLEXIBLE BRONCHOSCOPY;  Surgeon: Gaye Pollack, MD;  Location: [place];  Service: Thoracic;  Laterality: N/A;   FLEXIBLE SIGMOIDOSCOPY N/A 01/11/2015   Procedure: FLEXIBLE SIGMOIDOSCOPY;  Surgeon: Garlan Fair, MD;  Location: [facility];  Service: Endoscopy;  Laterality: N/A;  unable to complete colon-prep issues   GANGLION CYST EXCISION Left 1975   "wrist"   LEFT HEART CATH AND CORONARY ANGIOGRAPHY N/A 06/29/2016   Procedure: Left Heart Cath and Coronary Angiography;  Surgeon: Jettie Booze, MD;  Location: [place] CV LAB;  Service: Cardiovascular;  Laterality: N/A;   LEFT HEART CATH AND CORONARY ANGIOGRAPHY N/A 08/19/2018   Procedure: LEFT HEART CATH AND CORONARY ANGIOGRAPHY;  Surgeon: Burnell Blanks, MD;  Location:  [place] CV LAB;  Service: Cardiovascular;  Laterality: N/A;   LEFT HEART CATHETERIZATION WITH CORONARY ANGIOGRAM N/A 12/08/2012   Procedure: LEFT HEART CATHETERIZATION WITH CORONARY ANGIOGRAM;  Surgeon: Jettie Booze, MD;  Location: [facility] CATH LAB;  Service: Cardiovascular;  Laterality: N/A;   MOUTH SURGERY  2010?   "for bone loss" (06/24/2012)   PERCUTANEOUS CORONARY STENT INTERVENTION (PCI-S) N/A 10/09/2012   Procedure: PERCUTANEOUS CORONARY STENT INTERVENTION (PCI-S);  Surgeon: Jettie Booze, MD;  Location: [facility] CATH LAB;  Service: Cardiovascular;  Laterality: N/A;   RIGHT HEART CATH N/A 08/19/2018   Procedure: RIGHT HEART CATH;  Surgeon: Burnell Blanks, MD;  Location: [place] CV LAB;  Service: Cardiovascular;  Laterality: N/A;   TEE WITHOUT CARDIOVERSION N/A 08/22/2018   Procedure: TRANSESOPHAGEAL ECHOCARDIOGRAM (TEE);  Surgeon: Pixie Casino, MD;  Location: [facility] ENDOSCOPY;  Service: Cardiovascular;  Laterality: N/A;   TEE WITHOUT CARDIOVERSION N/A 08/26/2018   Procedure: TRANSESOPHAGEAL ECHOCARDIOGRAM (TEE);  Surgeon: Burnell Blanks, MD;  Location: [name];  Service: Open Heart Surgery;  Laterality: N/A;   THORACOTOMY/LOBECTOMY Right  03/02/2013   Procedure: Right Video Assisted Thoracoscopy/Thoracotomy with upper Lobectomy;  Surgeon: Gaye Pollack, MD;  Location: [facility] OR;  Service: Thoracic;  Laterality: Right;  Right Lung Upper  Lobectomy    TONSILLECTOMY  1960   VIDEO BRONCHOSCOPY  09/12/2011   Procedure: VIDEO BRONCHOSCOPY WITH FLUORO;  Surgeon: Tanda Rockers, MD;  Location: [facility] ENDOSCOPY;  Service: Cardiopulmonary;  Laterality: Bilateral;     Current Meds  Medication Sig   albuterol (PROAIR HFA) 108 (90 Base) MCG/ACT inhaler Inhale 2 puffs into the lungs every 6 (six) hours as needed for wheezing or shortness of breath.   albuterol (PROVENTIL) (2.5 MG/3ML) 0.083% nebulizer solution Take 3 mLs (2.5 mg total) by nebulization every 4 (four) hours as  needed for wheezing or shortness of breath.   Alirocumab (PRALUENT) 150 MG/ML SOAJ Inject 1 pen into the skin every 14 (fourteen) days. (Patient taking differently: Inject 150 mg into the skin every 14 (fourteen) days. Tuesdays.)   aspirin 81 MG chewable tablet Chew 1 tablet (81 mg total) by mouth daily.   bisoprolol (ZEBETA) 5 MG tablet Take 0.5 tablets (2.5 mg total) by mouth daily.   clindamycin (CLEOCIN) 300 MG capsule Take 2 capsules (600 mg) 30-60 minutes before dental work.   clopidogrel (PLAVIX) 75 MG tablet TAKE 1 TABLET(75 MG) BY MOUTH DAILY   digoxin (LANOXIN) 0.125 MG tablet Take 1 tablet (0.125 mg total) by mouth every other day.   fluticasone (FLONASE) 50 MCG/ACT nasal spray Place 1 spray into both nostrils daily as needed (head congestion.).    Fluticasone-Umeclidin-Vilant (TRELEGY ELLIPTA) 100-62.5-25 MCG/INH AEPB Inhale 1 puff into the lungs daily.   furosemide (LASIX) 80 MG tablet Take 1 tablet (80 mg total) by mouth 2 (two) times daily with breakfast and lunch.   Hypromellose (GENTEAL MILD) 0.2 % SOLN Place 2 drops into both eyes 4 (four) times daily as needed (dry eyes).    ibuprofen (ADVIL) 200 MG tablet Take 200 mg by mouth every 6 (six) hours as needed.   lansoprazole (PREVACID) 30 MG capsule Take 1 capsule (30 mg total) by mouth daily at 12 noon. (Patient taking differently: Take 30 mg by mouth daily before breakfast. )   losartan (COZAAR) 25 MG tablet Take 1 tablet (25 mg total) by mouth at bedtime.   NASAL SALINE NA Place 1 spray into the nose daily as needed (congestion).    nitroGLYCERIN (NITROSTAT) 0.4 MG SL tablet Place 1 tablet (0.4 mg total) under the tongue every 5 (five) minutes as needed for chest pain.   OXYGEN 2lpm with sleep   simethicone (MYLICON) 80 MG chewable tablet Chew 2 tablets (160 mg total) by mouth every 6 (six) hours as needed for flatulence.   Spacer/Aero-Holding Chambers (AEROCHAMBER MV) inhaler Use as instructed   Specialty  Vitamins Products ([employer] MENOPAUSE SUPPORT PO) Take 1 capsule by mouth daily.   spironolactone (ALDACTONE) 25 MG tablet Take 0.5 tablets (12.5 mg total) by mouth daily.   [DISCONTINUED] furosemide (LASIX) 80 MG tablet Take 1 tablet (80 mg total) by mouth 2 (two) times daily with breakfast and lunch.     Allergies:   Azithromycin, Ceclor [cefaclor], Penicillins, Septra [sulfamethoxazole-trimethoprim], Doxycycline, Lipitor [atorvastatin], Morphine and related, Simvastatin, Zetia [ezetimibe], Adhesive [tape], and Vicodin [hydrocodone-acetaminophen]   Social History   Tobacco Use   Smoking status: Former Smoker    Packs/day: 0.50    Years: 40.00    Pack years: 20.00    Types: Cigarettes    Quit date: 10/08/2012  Years since quitting: 5.9   Smokeless tobacco: Never Used  Substance Use Topics   Alcohol use: No   Drug use: No     Family Hx: The patient's family history includes Alcohol abuse in her father; Lupus in her mother; Migraines in her sister; Other in her sister; Pulmonary fibrosis in her brother. There is no history of Heart attack.  ROS:   Please see the history of present illness.    All other systems reviewed and are negative.   Prior CV studies:   The following studies were reviewed today:  CARDIOTHORACIC SURGERY OPERATIVE NOTE  Date of Procedure:                08/26/2018  Preoperative Diagnosis:        Severe Aortic Stenosis   Severe Mitral Regurgitation  Postoperative Diagnosis:    Same   Procedure:        Transcatheter Aortic Valve Replacement - Open Left Transaxillary Approach             Edwards Sapien 3 Transcatheter Heart Valve (size 23 mm, model # 9600TFX, serial # [id number])              Co-Surgeons:                        Valentina Gu. Roxy Manns, MD and Lauree Chandler, MD  Anesthesiologist:                  Albertha Ghee, MD  Echocardiographer:              Ena Dawley, MD  Pre-operative Echo Findings: ? Severe aortic  stenosis ? Severe mitral regurgitation ? Severe left ventricular systolic dysfunction  Post-operative Echo Findings: ? Mild paravalvular leak ? Unchanged left ventricular systolic function   ___________________  Echo 08/27/18 IMPRESSIONS  1. The left ventricle has severely reduced systolic function, with an ejection fraction of 20-25%. The cavity size was mildly dilated. Left ventricular diastolic Doppler parameters are consistent with pseudonormalization. Left ventricular diffuse  hypokinesis.  2. Left atrial size was moderately dilated.  3. Trivial pericardial effusion is present.  4. The mitral valve is degenerative. There is mild mitral annular calcification present. Mitral valve regurgitation is severe by color flow Doppler. There is restriction of the posterior mitral leaflet with posteriorly-directed mitral regurgitation.  Possible infarct-related mitral regurgitation. No evidence of mitral valve stenosis.  5. Tricuspid valve regurgitation is mild-moderate.  6. Bioprosthetic aortic valve s/p TAVR. Mean gradient 10 mmHg, no significant stenosis. No significant regurgitation noted.  7. The right ventricle has moderately reduced systolic function. The cavity was normal. There is no increase in right ventricular wall thickness.  8. The inferior vena cava was normal in size with <50% respiratory variability. PA systolic pressure 53 mmHg.  ___________________  Echo 09/25/18 IMPRESSIONS  1. The left ventricle has moderate-severely reduced systolic function, with an ejection fraction of 30-35%. The cavity size was mildly dilated. Left ventricular diastolic Doppler parameters are consistent with impaired relaxation. Left ventrical global  hypokinesis without regional wall motion abnormalities.  2. The right ventricle has normal systolic function. The cavity was normal. There is no increase in right ventricular wall thickness. Right ventricular systolic pressure could not be assessed.   3. Left atrial size was moderately dilated.  4. Mild thickening of the mitral valve leaflet. There is mild mitral annular calcification present. The MR jet is centrally-directed.  5. A 8mm an Wende Crease Sapien bioprosthetic  aortic valve (TAVR) valve is present in the aortic position.  6. The aorta is normal in size and structure.  7. The aortic root and ascending aorta are normal in size and structure.  8. When compared to the prior study: 08/26/2018, there is improved left ventricular systolic function, markedly reduced mitral insufficiency and a substantial reduction in TAVR perivalvular leak.   Labs/Other Tests and Data Reviewed:    EKG:  No ECG reviewed.  Recent Labs: 01/02/2018: ALT 10 08/04/2018: TSH 1.36 08/12/2018: NT-Pro BNP 33,156 08/21/2018: B Natriuretic Peptide >4,500.0 08/27/2018: Hemoglobin 10.3; Magnesium 2.2; Platelets 226 09/09/2018: BUN 17; Creatinine, Ser 0.67; Potassium 4.6; Sodium 139   Recent Lipid Panel Lab Results  Component Value Date/Time   CHOL 152 08/18/2015 08:40 AM   TRIG 105 08/18/2015 08:40 AM   HDL 65 08/18/2015 08:40 AM   CHOLHDL 2.3 08/18/2015 08:40 AM   LDLCALC 66 08/18/2015 08:40 AM    Wt Readings from Last 3 Encounters:  09/25/18 90 lb (40.8 kg)  09/09/18 92 lb 6.4 oz (41.9 kg)  09/03/18 90 lb (40.8 kg)     Objective:    Vital Signs:  BP (!) 117/37    Pulse (!) 58    Ht 4\' 11"  (1.499 m)    Wt 90 lb (40.8 kg)    LMP 03/25/2011    BMI 18.18 kg/m    Well nourished, well developed female in no acute distress. Chest wall incision looks good.    ASSESSMENT & PLAN:    Severe AS s/p TAVR: echo 09/24/18 showed improvement of EF to 30-35%, mild centrally directed MR, normally functioning TAVR with a mean gradient of 17 mm Hg and trivial PVL. She has NYHA class I symptoms and feeling amazing with a dramatic improvement in symptoms. SBE prophylaxis discussed; she has clindamycin. Aspirin can be discontinued after 6 months of therapy (02/25/19).  She has been on chronic plavix.   Chronic systolic CHF: EF improved to 30-35%. Continue GDMT with Losartan 25mg  daily, bisoprolol 2.5mg  daily, spiro 12.5mg  daily, digoxin 0.125mg  QOD and lasix 80 mg BID. She will be seen back by advanced CHF clinic in 2-3 months.  Functional MR: now improved to mild from severe on echo yesterday  HTN: BP well controlled. Being treated in the context of her heart failure meds.  CAD: no chest pain. Continue medical therapy. Intolerant to statins.   COPD: quit smoking 2014. Followed by Dr. Melvyn Novas  Hx of lung CA: s/p resection in 2014  COVID-19 Education: The signs and symptoms of COVID-19 were discussed with the patient and how to seek care for testing (follow up with PCP or arrange E-visit).  The importance of social distancing was discussed today.  Time:   Today, I have spent 25 minutes with the patient with telehealth technology discussing the above problems.     Medication Adjustments/Labs and Tests Ordered: Current medicines are reviewed at length with the patient today.  Concerns regarding medicines are outlined above.   Tests Ordered: No orders of the defined types were placed in this encounter.   Medication Changes: Meds ordered this encounter  Medications   furosemide (LASIX) 80 MG tablet    Sig: Take 1 tablet (80 mg total) by mouth 2 (two) times daily with breakfast and lunch.    Dispense:  180 tablet    Refill:  2    Disposition:  Follow up in 1 year(s)  Signed, Angelena Form, PA-C  09/25/2018 4:54 PM    [facility]  [facility]

## 2018-09-25 NOTE — Patient Instructions (Signed)
Medication Instructions:  Your physician recommends that you continue on your current medications as directed. Please refer to the Current Medication list given to you today. Stop aspirin on December 23,2020 If you need a refill on your cardiac medications before your next appointment, please call your pharmacy.   Lab work: none If you have labs (blood work) drawn today and your tests are completely normal, you will receive your results only by: [name] MyChart Message (if you have MyChart) OR . A paper copy in the mail If you have any lab test that is abnormal or we need to change your treatment, we will call you to review the results.  Testing/Procedures: Your physician has requested that you have an echocardiogram. Echocardiography is a painless test that uses sound waves to create images of your heart. It provides your doctor with information about the size and shape of your heart and how well your heart's chambers and valves are working. This procedure takes approximately one hour. There are no restrictions for this procedure.  To be done in about one year--Scheduled for June 23,2021 at 12:50.  Follow-Up: Your physician recommends that you schedule a follow-up appointment in: 2-3 months with Dr. Haroldine Laws.  You will see K. Grandville Silos, [place] on June 23,2021 after your echocardiogram

## 2018-09-26 ENCOUNTER — Telehealth: Payer: Self-pay | Admitting: Internal Medicine

## 2018-09-26 NOTE — Telephone Encounter (Signed)
Called and spoke with pt. Pt wanted to thank MW for all he has done for her. Pt said at last visit with MW, MW told her that he believed all her problems were her heart not her lungs.  Pt stated to me that she went to see a cardiologist, had an echo performed and then after the echo, cardiologist found out that there was a problem with her aorta.  Pt had surgery 6/23 to have her aortic valve replaced and pt said after the surgery, she immediately was feeling better.   Pt wanted MW to know all this and also to say thank you. Routing to MW as an Pharmacist, hospital.

## 2018-09-30 ENCOUNTER — Telehealth: Payer: Self-pay | Admitting: Internal Medicine

## 2018-09-30 ENCOUNTER — Telehealth (HOSPITAL_COMMUNITY): Payer: Self-pay | Admitting: *Deleted

## 2018-09-30 NOTE — Telephone Encounter (Signed)
Called as requested from previous telephone call from Cardiac Rehab for scheduling.  Left message requesting a call back.  Pt was unsure she wanted to do the exercise program.  Call back number provided. Cherre Huger, BSN Cardiac and Training and development officer

## 2018-09-30 NOTE — Telephone Encounter (Signed)
Called and spoke with pt letting her know an idea that was given per Magda Paganini of something that MW likes and she verbalized understanding. Nothing further needed.

## 2018-10-02 ENCOUNTER — Telehealth (HOSPITAL_COMMUNITY): Payer: Self-pay

## 2018-10-02 NOTE — Telephone Encounter (Signed)
Cardiac Rehab Medication Review by a Pharmacist  Does the patient  feel that his/her medications are working for him/her?  yes  Has the patient been experiencing any side effects to the medications prescribed?  yes  Does the patient measure his/her own blood pressure or blood glucose at home?  Yes, last measurement this AM 66/47/76, the day before was 90/40/71  Does the patient have any problems obtaining medications due to transportation or finances?   no  Understanding of regimen: good Understanding of indications: good Potential of compliance: good  Pharmacist comments: Patient measures blood pressure at home daily at 5am, but is not sure how accurate the measurements are. She states that her blood pressure runs low. I discussed with her the importance of seeking medical attention if her blood pressure is low and she exhibits symptoms of hypotension (dizziness, lightheadedness, fatigue, blurred vision, etc).     Agnes Lawrence, PharmD PGY1 Pharmacy Resident

## 2018-10-07 ENCOUNTER — Telehealth (HOSPITAL_COMMUNITY): Payer: Self-pay | Admitting: *Deleted

## 2018-10-07 ENCOUNTER — Encounter (HOSPITAL_COMMUNITY)
Admission: RE | Admit: 2018-10-07 | Discharge: 2018-10-07 | Disposition: A | Discharge status: Home / Self Care | Payer: PPO | Source: Ambulatory Visit | Attending: Cardiovascular Disease | Admitting: Cardiovascular Disease

## 2018-10-07 ENCOUNTER — Other Ambulatory Visit: Payer: Self-pay

## 2018-10-07 ENCOUNTER — Encounter (HOSPITAL_COMMUNITY): Payer: Self-pay

## 2018-10-07 VITALS — BP 108/60 | HR 59 | Temp 95.5°F | Ht 59.0 in | Wt 93.3 lb

## 2018-10-07 DIAGNOSIS — Z952 Presence of prosthetic heart valve: Secondary | ICD-10-CM

## 2018-10-07 HISTORY — DX: Atherosclerotic heart disease of native coronary artery without angina pectoris: I25.10

## 2018-10-07 NOTE — Progress Notes (Signed)
Cardiac Individual Treatment Plan  Patient Details  Name: Teresa [name] MRN: [id number] Date of Birth: [birth year] Referring Provider:     CARDIAC REHAB PHASE II ORIENTATION from 10/07/2018 in [place]  Referring Provider  Lauree Chandler, MD      Initial Encounter Date:    CARDIAC REHAB PHASE II ORIENTATION from 10/07/2018 in [place]  Date  10/07/18      Visit Diagnosis: S/P TAVR 08/26/18  Patient's Home Medications on Admission:  Current Outpatient Medications:  .  albuterol (PROAIR HFA) 108 (90 Base) MCG/ACT inhaler, Inhale 2 puffs into the lungs every 6 (six) hours as needed for wheezing or shortness of breath., Disp: , Rfl:  .  Alirocumab (PRALUENT) 150 MG/ML SOAJ, Inject 1 pen into the skin every 14 (fourteen) days. (Patient taking differently: Inject 150 mg into the skin every 14 (fourteen) days. Tuesdays.), Disp: 6 pen, Rfl: 3 .  aspirin 81 MG chewable tablet, Chew 1 tablet (81 mg total) by mouth daily., Disp:  , Rfl:  .  bisoprolol (ZEBETA) 5 MG tablet, Take 0.5 tablets (2.5 mg total) by mouth daily., Disp: 90 tablet, Rfl: 3 .  clopidogrel (PLAVIX) 75 MG tablet, TAKE 1 TABLET(75 MG) BY MOUTH DAILY, Disp: 90 tablet, Rfl: 3 .  digoxin (LANOXIN) 0.125 MG tablet, Take 1 tablet (0.125 mg total) by mouth every other day., Disp: , Rfl:  .  fluticasone (FLONASE) 50 MCG/ACT nasal spray, Place 1 spray into both nostrils daily as needed (head congestion.). , Disp: , Rfl: 4 .  Fluticasone-Umeclidin-Vilant (TRELEGY ELLIPTA) 100-62.5-25 MCG/INH AEPB, Inhale 1 puff into the lungs daily., Disp: 60 each, Rfl: 11 .  furosemide (LASIX) 80 MG tablet, Take 1 tablet (80 mg total) by mouth 2 (two) times daily with breakfast and lunch., Disp: 180 tablet, Rfl: 2 .  Hypromellose (GENTEAL MILD) 0.2 % SOLN, Place 2 drops into both eyes 4 (four) times daily as needed (dry eyes). , Disp: , Rfl:  .  ibuprofen (ADVIL) 200 MG tablet,  Take 200 mg by mouth every 6 (six) hours as needed., Disp: , Rfl:  .  lansoprazole (PREVACID) 30 MG capsule, Take 1 capsule (30 mg total) by mouth daily at 12 noon. (Patient taking differently: Take 30 mg by mouth daily before lunch. ), Disp: 90 capsule, Rfl: 3 .  losartan (COZAAR) 25 MG tablet, Take 1 tablet (25 mg total) by mouth at bedtime., Disp: 30 tablet, Rfl: 2 .  NASAL SALINE NA, Place 1 spray into the nose daily as needed (congestion). , Disp: , Rfl:  .  OXYGEN, 2lpm with sleep, Disp: , Rfl:  .  ranitidine (ZANTAC) 150 MG capsule, Take 150 mg by mouth at bedtime as needed for heartburn., Disp: , Rfl:  .  simethicone (MYLICON) 80 MG chewable tablet, Chew 2 tablets (160 mg total) by mouth every 6 (six) hours as needed for flatulence., Disp: 30 tablet, Rfl: 0 .  Specialty Vitamins Products ([employer] MENOPAUSE SUPPORT PO), Take 1 capsule by mouth daily., Disp: , Rfl:  .  spironolactone (ALDACTONE) 25 MG tablet, Take 0.5 tablets (12.5 mg total) by mouth daily., Disp: 30 tablet, Rfl: 2 .  albuterol (PROVENTIL) (2.5 MG/3ML) 0.083% nebulizer solution, Take 3 mLs (2.5 mg total) by nebulization every 4 (four) hours as needed for wheezing or shortness of breath. (Patient not taking: Reported on 10/02/2018), Disp: 75 mL, Rfl: 12 .  clindamycin (CLEOCIN) 300 MG capsule, Take 2 capsules (600 mg) 30-60  minutes before dental work. (Patient not taking: Reported on 10/02/2018), Disp: 2 capsule, Rfl: 1 .  nitroGLYCERIN (NITROSTAT) 0.4 MG SL tablet, Place 1 tablet (0.4 mg total) under the tongue every 5 (five) minutes as needed for chest pain. (Patient not taking: Reported on 10/02/2018), Disp: 25 tablet, Rfl: 4 .  Spacer/Aero-Holding Chambers (AEROCHAMBER MV) inhaler, Use as instructed (Patient not taking: Reported on 10/02/2018), Disp: 1 each, Rfl: 0  Past Medical History: Past Medical History:  Diagnosis Date  . AAA (abdominal aortic aneurysm) ([place])    a. small by cath 2014.  [name] Acute on chronic combined systolic  and diastolic heart failure ([place]) 08/19/2018  . Arthritis    "across my hips; buttocks; comes w/the weather" (02/27/2013)  . CAD (coronary artery disease), native coronary artery    a. 10/2012 - rotational atherectomy of LAD c/b dissection s/p PCI to LAD, LCx, LM. b. 12/2012 - s/p overlapping DES to RCA.  . Cataract    "just the beginnings on the right" (02/27/2013)  . Chronic combined systolic and diastolic CHF (congestive heart failure) ([place])   . COPD (chronic obstructive pulmonary disease) ([place])   . Coronary artery disease   . GERD (gastroesophageal reflux disease)   . Hypercholesterolemia   . Ischemic cardiomyopathy    a. EF 45-50%.  . Mass of lung    "small tumor RUL; they are watching it" (02/27/2013)  . Mitral regurgitation   . Peripheral vascular disease, unspecified ([place])   . S/P TAVR (transcatheter aortic valve replacement)    23 mm Edwards Sapien 3 transcatheter heart valve placed via open left transaxillary approach   . Severe aortic stenosis   . Severe mitral regurgitation   . Tachycardia, unspecified   . Tobacco abuse     Tobacco Use: Social History   Tobacco Use  Smoking Status Former Smoker  . Packs/day: 0.50  . Years: 40.00  . Pack years: 20.00  . Types: Cigarettes  . Quit date: 10/08/2012  . Years since quitting: 6.0  Smokeless Tobacco Never Used    Labs: Recent Review Flowsheet Data    Labs for ITP Cardiac and Pulmonary Rehab Latest Ref Rng & Units 01/10/2015 08/18/2015 08/19/2018 08/19/2018 08/26/2018   Cholestrol 125 - 200 mg/dL 94(L) 152 - - -   LDLCALC <130 mg/dL 13 66 - - -   HDL >=46 mg/dL 56 65 - - -   Trlycerides <150 mg/dL 125 105 - - -   PHART 7.350 - 7.450 - - 7.467(H) - 7.346(L)   PCO2ART 32.0 - 48.0 mmHg - - 46.1 - 57.0(H)   HCO3 20.0 - 28.0 mmol/L - - 33.3(H) 30.5(H) 31.2(H)   TCO2 22 - 32 mmol/L - - 35(H) 32 33(H)   O2SAT % - - 99.0 63.0 95.0      Capillary Blood Glucose: Lab Results  Component Value Date   GLUCAP 96 03/03/2013    GLUCAP 78 03/03/2013   GLUCAP 142 (H) 03/02/2013   GLUCAP 167 (H) 03/02/2013   GLUCAP 98 08/22/2012     Exercise Target Goals: Exercise Program Goal: Individual exercise prescription set using results from initial 6 min walk test and THRR while considering  patient's activity barriers and safety.   Exercise Prescription Goal: Starting with aerobic activity 30 plus minutes a day, 3 days per week for initial exercise prescription. Provide home exercise prescription and guidelines that participant acknowledges understanding prior to discharge.  Activity Barriers & Risk Stratification: Activity Barriers & Cardiac Risk Stratification - 10/07/18 1256  Activity Barriers & Cardiac Risk Stratification   Activity Barriers  None    Cardiac Risk Stratification  High       6 Minute Walk: 6 Minute Walk    Row Name 10/07/18 0845         6 Minute Walk   Phase  Initial     Distance  1509 feet     Walk Time  6 minutes     # of Rest Breaks  0     MPH  2.86     METS  3.53     RPE  9     Perceived Dyspnea   0     VO2 Peak  12.36     Symptoms  No     Resting HR  59 bpm     Resting BP  108/60     Resting Oxygen Saturation   97 %     Exercise Oxygen Saturation  during 6 min walk  95 %     Max Ex. HR  78 bpm     Max Ex. BP  128/42     2 Minute Post BP  108/42        Oxygen Initial Assessment:   Oxygen Re-Evaluation:   Oxygen Discharge (Final Oxygen Re-Evaluation):   Initial Exercise Prescription: Initial Exercise Prescription - 10/07/18 1200      Date of Initial Exercise RX and Referring Provider   Date  10/07/18    Referring Provider  Lauree Chandler, MD    Expected Discharge Date  11/21/18      Recumbant Bike   Level  1.5    Watts  15    Minutes  15    METs  3.1      NuStep   Level  2    SPM  85    Minutes  15    METs  2.5      Prescription Details   Frequency (times per week)  3    Duration  Progress to 30 minutes of continuous aerobic without  signs/symptoms of physical distress      Intensity   THRR 40-80% of Max Heartrate  62-124    Ratings of Perceived Exertion  11-13    Perceived Dyspnea  0-4      Progression   Progression  Continue to progress workloads to maintain intensity without signs/symptoms of physical distress.      Resistance Training   Training Prescription  Yes    Weight  2lbs    Reps  10-15       Perform Capillary Blood Glucose checks as needed.  Exercise Prescription Changes:   Exercise Comments:   Exercise Goals and Review:  Exercise Goals    Row Name 10/07/18 0820             Exercise Goals   Increase Physical Activity  Yes       Intervention  Provide advice, education, support and counseling about physical activity/exercise needs.;Develop an individualized exercise prescription for aerobic and resistive training based on initial evaluation findings, risk stratification, comorbidities and participant's personal goals.       Expected Outcomes  Short Term: Attend rehab on a regular basis to increase amount of physical activity.;Long Term: Exercising regularly at least 3-5 days a week.;Long Term: Add in home exercise to make exercise part of routine and to increase amount of physical activity.       Increase Strength and Stamina  Yes  Intervention  Provide advice, education, support and counseling about physical activity/exercise needs.;Develop an individualized exercise prescription for aerobic and resistive training based on initial evaluation findings, risk stratification, comorbidities and participant's personal goals.       Expected Outcomes  Short Term: Increase workloads from initial exercise prescription for resistance, speed, and METs.;Short Term: Perform resistance training exercises routinely during rehab and add in resistance training at home;Long Term: Improve cardiorespiratory fitness, muscular endurance and strength as measured by increased METs and functional capacity (6MWT)        Able to understand and use rate of perceived exertion (RPE) scale  Yes       Intervention  Provide education and explanation on how to use RPE scale       Expected Outcomes  Short Term: Able to use RPE daily in rehab to express subjective intensity level;Long Term:  Able to use RPE to guide intensity level when exercising independently       Knowledge and understanding of Target Heart Rate Range (THRR)  Yes       Intervention  Provide education and explanation of THRR including how the numbers were predicted and where they are located for reference       Expected Outcomes  Short Term: Able to state/look up THRR;Long Term: Able to use THRR to govern intensity when exercising independently;Short Term: Able to use daily as guideline for intensity in rehab       Able to check pulse independently  Yes       Intervention  Provide education and demonstration on how to check pulse in carotid and radial arteries.;Review the importance of being able to check your own pulse for safety during independent exercise       Expected Outcomes  Long Term: Able to check pulse independently and accurately;Short Term: Able to explain why pulse checking is important during independent exercise       Understanding of Exercise Prescription  Yes       Intervention  Provide education, explanation, and written materials on patient's individual exercise prescription       Expected Outcomes  Short Term: Able to explain program exercise prescription;Long Term: Able to explain home exercise prescription to exercise independently          Exercise Goals Re-Evaluation :    Discharge Exercise Prescription (Final Exercise Prescription Changes):   Nutrition:  Target Goals: Understanding of nutrition guidelines, daily intake of sodium 1500mg , cholesterol 200mg , calories 30% from fat and 7% or less from saturated fats, daily to have 5 or more servings of fruits and vegetables.  Biometrics: Pre Biometrics - 10/07/18 0824       Pre Biometrics   Height  4\' 11"  (1.499 m)    Weight  42.3 kg    Waist Circumference  28.75 inches    Hip Circumference  32.25 inches    Waist to Hip Ratio  0.89 %    BMI (Calculated)  18.83    Triceps Skinfold  13 mm    % Body Fat  28.7 %    Grip Strength  22.5 kg    Flexibility  11 in    Single Leg Stand  9.87 seconds        Nutrition Therapy Plan and Nutrition Goals:   Nutrition Assessments:   Nutrition Goals Re-Evaluation:   Nutrition Goals Discharge (Final Nutrition Goals Re-Evaluation):   Psychosocial: Target Goals: Acknowledge presence or absence of significant depression and/or stress, maximize coping skills, provide positive support system. Participant  is able to verbalize types and ability to use techniques and skills needed for reducing stress and depression.  Initial Review & Psychosocial Screening: Initial Psych Review & Screening - 10/07/18 1426      Initial Review   Current issues with  None Identified      Family Dynamics   Good Support System?  Yes   Jennica has her husband for support.     Barriers   Psychosocial barriers to participate in program  There are no identifiable barriers or psychosocial needs.      Screening Interventions   Interventions  Encouraged to exercise       Quality of Life Scores: Quality of Life - 10/07/18 1301      Quality of Life   Select  Quality of Life      Quality of Life Scores   Health/Function Pre  29.03 %    Socioeconomic Pre  28.75 %    Psych/Spiritual Pre  27.86 %    Family Pre  30 %    GLOBAL Pre  28.87 %      Scores of 19 and below usually indicate a poorer quality of life in these areas.  A difference of  2-3 points is a clinically meaningful difference.  A difference of 2-3 points in the total score of the Quality of Life Index has been associated with significant improvement in overall quality of life, self-image, physical symptoms, and general health in studies assessing change in quality of  life.  PHQ-9: Recent Review Flowsheet Data    Depression screen [facility] 2/9 12/24/2012   Decreased Interest 0   Down, Depressed, Hopeless 0   PHQ - 2 Score 0     Interpretation of Total Score  Total Score Depression Severity:  1-4 = Minimal depression, 5-9 = Mild depression, 10-14 = Moderate depression, 15-19 = Moderately severe depression, 20-27 = Severe depression   Psychosocial Evaluation and Intervention:   Psychosocial Re-Evaluation:   Psychosocial Discharge (Final Psychosocial Re-Evaluation):   Vocational Rehabilitation: Provide vocational rehab assistance to qualifying candidates.   Vocational Rehab Evaluation & Intervention: Vocational Rehab - 10/07/18 1432      Initial Vocational Rehab Evaluation & Intervention   Assessment shows need for Vocational Rehabilitation  No       Education: Education Goals: Education classes will be provided on a weekly basis, covering required topics. Participant will state understanding/return demonstration of topics presented.  Learning Barriers/Preferences: Learning Barriers/Preferences - 10/07/18 1302      Learning Barriers/Preferences   Learning Barriers  Hearing    Learning Preferences  None       Education Topics: Hypertension, Hypertension Reduction -Define heart disease and high blood pressure. Discus how high blood pressure affects the body and ways to reduce high blood pressure.   Exercise and Your Heart -Discuss why it is important to exercise, the FITT principles of exercise, normal and abnormal responses to exercise, and how to exercise safely.   Angina -Discuss definition of angina, causes of angina, treatment of angina, and how to decrease risk of having angina.   Cardiac Medications -Review what the following cardiac medications are used for, how they affect the body, and side effects that may occur when taking the medications.  Medications include Aspirin, Beta blockers, calcium channel blockers, ACE  Inhibitors, angiotensin receptor blockers, diuretics, digoxin, and antihyperlipidemics.   Congestive Heart Failure -Discuss the definition of CHF, how to live with CHF, the signs and symptoms of CHF, and how keep track of weight  and sodium intake.   Heart Disease and Intimacy -Discus the effect sexual activity has on the heart, how changes occur during intimacy as we age, and safety during sexual activity.   Smoking Cessation / COPD -Discuss different methods to quit smoking, the health benefits of quitting smoking, and the definition of COPD.   Nutrition I: Fats -Discuss the types of cholesterol, what cholesterol does to the heart, and how cholesterol levels can be controlled.   Nutrition II: Labels -Discuss the different components of food labels and how to read food label   Heart Parts/Heart Disease and PAD -Discuss the anatomy of the heart, the pathway of blood circulation through the heart, and these are affected by heart disease.   Stress I: Signs and Symptoms -Discuss the causes of stress, how stress may lead to anxiety and depression, and ways to limit stress.   Stress II: Relaxation -Discuss different types of relaxation techniques to limit stress.   Warning Signs of Stroke / TIA -Discuss definition of a stroke, what the signs and symptoms are of a stroke, and how to identify when someone is having stroke.   Knowledge Questionnaire Score: Knowledge Questionnaire Score - 10/07/18 1302      Knowledge Questionnaire Score   Pre Score  20/24       Core Components/Risk Factors/Patient Goals at Admission: Personal Goals and Risk Factors at Admission - 10/07/18 1432      Core Components/Risk Factors/Patient Goals on Admission    Weight Management  Weight Maintenance    Improve shortness of breath with ADL's  Yes    Intervention  Provide education, individualized exercise plan and daily activity instruction to help decrease symptoms of SOB with activities of daily  living.    Expected Outcomes  Short Term: Improve cardiorespiratory fitness to achieve a reduction of symptoms when performing ADLs;Long Term: Be able to perform more ADLs without symptoms or delay the onset of symptoms    Heart Failure  Yes    Expected Outcomes  Improve functional capacity of life;Short term: Attendance in program 2-3 days a week with increased exercise capacity. Reported lower sodium intake. Reported increased fruit and vegetable intake. Reports medication compliance.;Short term: Daily weights obtained and reported for increase. Utilizing diuretic protocols set by physician.    Lipids  Yes    Intervention  Provide education and support for participant on nutrition & aerobic/resistive exercise along with prescribed medications to achieve LDL 70mg , HDL >40mg .    Expected Outcomes  Short Term: Participant states understanding of desired cholesterol values and is compliant with medications prescribed. Participant is following exercise prescription and nutrition guidelines.;Long Term: Cholesterol controlled with medications as prescribed, with individualized exercise RX and with personalized nutrition plan. Value goals: LDL < 70mg , HDL > 40 mg.    Stress  Yes    Intervention  Offer individual and/or small group education and counseling on adjustment to heart disease, stress management and health-related lifestyle change. Teach and support self-help strategies.;Refer participants experiencing significant psychosocial distress to appropriate mental health specialists for further evaluation and treatment. When possible, include family members and significant others in education/counseling sessions.       Core Components/Risk Factors/Patient Goals Review:    Core Components/Risk Factors/Patient Goals at Discharge (Final Review):    ITP Comments: ITP Comments    Row Name 10/07/18 0800           ITP Comments  Medical Director- Dr. Fransico Him, MD          Comments:  Patient  attended orientation on 10/07/2018 to review rules and guidelines for program.  Completed 6 minute walk test, Intitial ITP, and exercise prescription.  VSS. Telemetry-Sinus Rhythm.  Asymptomatic. Safety measures and social distancing in place per CDC guidelines. Shauntell's oxygen saturations ranged from 95%-99% on room air.Barnet Pall, RN,BSN 10/07/2018 2:44 PM

## 2018-10-07 NOTE — Telephone Encounter (Signed)
-----   Message from Burnell Blanks, MD sent at 10/07/2018  3:09 PM EDT ----- Regarding: RE: Any restrictions of activity for AAA for exercise at Cardiac Rehab She can exercise without limitation on her heart rate or blood pressure. I would restrict lifting to 20 lbs. Gerald Stabs ----- Message ----- From: Rowe Pavy, RN Sent: 10/06/2018   5:15 PM EDT To: Burnell Blanks, MD Subject: Any restrictions of activity for AAA for exe#  Dr. Angelena Form,  The above pt is scheduled for orientation to Cardiac Rehab s/p TAVR 08/26/18.  AAA noted in her history.  Any restrictions of activity or bp parameters to observe?  Pt reports she wears oxygen at night "precautionary". Reports O2 sats low 90's. (sees [facility] for COPD).  We will monitor pt o2 saturations resting and during exercise.  What is an acceptable low saturation for this pt - to apply oxygen during exercise?   Thanks so much for your advisement Maurice Small RN, BSN Cardiac and Pulmonary Rehab Nurse Navigator

## 2018-10-08 ENCOUNTER — Ambulatory Visit (INDEPENDENT_AMBULATORY_CARE_PROVIDER_SITE_OTHER): Payer: PPO | Admitting: Internal Medicine

## 2018-10-08 ENCOUNTER — Encounter: Payer: Self-pay | Admitting: Internal Medicine

## 2018-10-08 VITALS — BP 112/58 | HR 61 | Temp 98.0°F | Ht 59.5 in | Wt 91.0 lb

## 2018-10-08 DIAGNOSIS — J9611 Chronic respiratory failure with hypoxia: Secondary | ICD-10-CM | POA: Diagnosis not present

## 2018-10-08 DIAGNOSIS — R0609 Other forms of dyspnea: Secondary | ICD-10-CM

## 2018-10-08 DIAGNOSIS — R0600 Dyspnea, unspecified: Secondary | ICD-10-CM

## 2018-10-08 DIAGNOSIS — J449 Chronic obstructive pulmonary disease, unspecified: Secondary | ICD-10-CM | POA: Diagnosis not present

## 2018-10-08 MED ORDER — BEVESPI AEROSPHERE 9-4.8 MCG/ACT IN AERO: 2.0000 | INHALATION_SPRAY | Freq: Two times a day (BID) | RESPIRATORY_TRACT | 11 refills | Status: DC

## 2018-10-08 MED ORDER — BEVESPI AEROSPHERE 9-4.8 MCG/ACT IN AERO: 2.0000 | INHALATION_SPRAY | Freq: Two times a day (BID) | RESPIRATORY_TRACT | 0 refills | Status: DC

## 2018-10-08 NOTE — Progress Notes (Signed)
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  In meantime repeat CT Chest c/w evolving increase in density RUL lesion so referred to pulmonary. Has dx of mod to severe AS with most recent echo done 2 year prior to [place]  But yearly eval due w/in a week.  No ex cp or presyncope, denies any limiting sob with desired activities. rec You have a small nodule in right upper lobe that could be a atypical infection or an early tumor     DIAGNOSIS: Stage IB (T2a., N0, M0) poorly differentiated squamous cell carcinoma, diagnosed in December of 2014.  PRIOR THERAPY: Flexible video bronchoscopy, right upper lobectomy with mediastinal lymph node dissection under the care of Dr. Cyndia Bent.   CURRENT THERAPY: Observation as of ov 01/10/16 s any adjuvant rx    01/13/2016   Consultation  re:  Abn ct/ cough x 3 days on gerd rx tiw and cough drops  Chief Complaint  Patient presents with  . Pulmonary Consult    Referred by Dr. Earlie Server for eval of recent chest ct. She c/o cough for the past few days- prod with clear sputum.    Not limited by breathing from desired activities  / chasing toddlers around all  Day s need for albuterol except in the spring and the fall   Imp gold II copd/ uacs  rec When coughing > take prevacid 30 mg Take 30-60 min before first meal of the day and pepcid ac 20 mg at bedtime until you stop coughing GERD rx     12/31/2017 acute extended ov/ re:  Cough / sp ER eval 12/16/17 for  bronchospasm rx prednisone and improved some Chief Complaint  Patient presents with  . Acute Visit    Pt states having "bronchitis and bronchial spasms" since September 2019.  She is "hacking"- non prod cough.   was using proair in aug 2019 3-4 x per week for cough/ wheeze stopped working as well  Sept 2019  and rx pred/advair 12/26/17 helped breathing  a lot but throat started feeling "raw" and dry day > noct cough/ urge to clear throat which is new since started advair 250 dpi Sleep flat bed/ large pillow on side Walked at mall since starting advair nl pace  Not needing saba now  rec Plan A = Automatic = change advair to stiolto 2 pffs each am  Work on inhaler technique:   Plan B = Backup Only use your albuterol as a rescue medication    01/09/18 NP eval rec >>>BNP  =  2,266  Could consider high-res CT in the future Stiolto Respimat inhaler >>>2 puffs daily Only use your albuterol as a rescue medication    01/16/18  Cards eval/ [place] 1. CAD: Feeling much better after diuretics were increased.  Continue aggressive secondary prevention.  If she does develop any chest tightness or concern for angina, we will plan for  cardiac cath.  She will let us know if symptoms change.  LDL 76 in 8/19. 2. Aortic stenosis: No symptoms of severe aortic stenosis.  Preserved S2 on exam. 3. Combined systolic and diastolic heart failure: Much improved with diuretics.  Will decrease Lasix back to 40 mg daily.  I have advised her to take a second dose of Lasix 2 days a week.  She would also need an additional dose of Lasix if she gains more than 3 pounds or knows she is eating something excessively salty.  She has been very compliant with our instructions to reduce salt in her diet.  I suspect this is also help with her fluid retention.  Recheck BNP and BMet today. 4. HTN: The current medical regimen is effective;  continue present plan and medications.      03/28/2018  f/u ov/ re: copd III maint  rx stiolto  Chief Complaint  Patient presents with  . Follow-up    She states on cipro for sinus and ear infection- her congestion and HA she had been having has resolved. She states her breathing is doing well and she has not been using her o2. She has been using her rescue inhaler daily per the request of her PCP.   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 Follow-up    Breathing is doing overall doing well. She has not had to use proair, but uses neb 3 x per day.   Dyspnea:  Garbage to street / baseline down street and back s stopping / slow pace = MMRC2 = can't walk a nl pace on a flat grade s sob but does fine slow and flat  Cough: min p neb Sleeping: 30 degrees  SABA use: no proair since started duoneb  02: 2lpm bedtime/ sats ok walking s 02  rec Plan A = Automatic = Trelegy one daily  - one click and take two separate deep drags  Plan B = Backup Only use your albuterol inhaler as a rescue medication  Plan C = Crisis - only use your albuterol nebulizer if you first try Plan B and it fails to help  Predisone ceiling is 20 mg daily and the floor is 5 mg daily  Please schedule a follow up office visit in 4 weeks, sooner if needed  with all medications /inhalers/ solutions in hand so we can verify exactly what you are taking. This includes all medications from all doctors and over the counters    08/04/2018  f/u ov/ re:  COPD III/  prednisone 10 mg one daily  And trelegy and freq saba  Chief Complaint  Patient presents with  . Follow-up    Breathing has been worse x 2 wks. She c/o chest tightness. She has been using her rescue inhaler more often.    Dyspnea:  10 min walking  s stopping with sats in 90s s 02 now  Cough: none Sleeping: 30 degrees SABA use: qid saba hfa typically with activity  no neb 02: prn not using hs  rec No change in medications for now Please schedule a follow up office visit in 4 weeks, sooner if needed  Late add: Recommended in extra dose of Lasix daily,  echocardiogram as soon as possible with cardiology to follow as well.   Admit date: 08/19/2018 Discharge date: 08/28/2018  Admission Diagnoses: Acute exacerbation of class IV chronic combined systolic and diastolic heart failure Chronic obstructive pulmonary disease Coronary artery disease post emergent PTCI to left main, LAD, and circumflex in the setting of acute coronary dissection Dyslipidemia Ischemic cardiomyopathy Peripheral arterial disease Gastroesophageal reflux disease Severe aortic stenosis Severe mitral insufficiency History of lung cancer  Discharge Diagnoses:  Principal Problem:   S/P TAVR (transcatheter aortic valve replacement)   COPD GOLD III    Former Smoker   CAD (coronary artery disease), native coronary artery   Mixed hyperlipidemia   Coronary atherosclerosis of native coronary artery   Lung cancer, upper lobe (HCC)   Obesity   Chronic respiratory failure with hypoxia (HCC)   Severe aortic stenosis   Mitral regurgitation   Acute on chronic combined systolic and diastolic heart failure ([place])    10/08/2018  f/u ov/ re: GOLD III copd/ marked improvement since TAVR Chief Complaint  Patient presents with  . Follow-up    Breathing is back to her normal baseline.   Dyspnea:  Walks an hour a day flat and slow = MMRC2 = can't walk a nl pace on a flat grade s sob but does fine slow and flat and no desats  on RA Cough: tickle in throat "little hack" on trelegy  Sleeping: ok 30 degrees SABA use: none 02: 2lpm hs only    No obvious day to day or daytime variability or assoc excess/ purulent sputum or mucus plugs or hemoptysis or cp or chest tightness, subjective wheeze or overt sinus or hb symptoms.   Sleeping as above without nocturnal  or early am exacerbation  of respiratory  c/o's or need for noct saba. Also denies any obvious fluctuation of symptoms with weather or environmental changes or other aggravating or alleviating factors except as outlined above   No unusual exposure hx or h/o childhood pna/ asthma or knowledge of premature birth.  Current Allergies, Complete Past Medical History, Past Surgical History, Family History, and Social History were reviewed in [employer] record.  ROS  The following are not active complaints unless bolded Hoarseness, sore throat, dysphagia, dental problems, itching, sneezing,  nasal congestion or discharge of excess mucus or purulent secretions, ear ache,   fever, chills, sweats, unintended wt loss or wt gain, classically pleuritic or exertional cp,  orthopnea pnd or arm/hand swelling  or leg swelling, presyncope, palpitations, abdominal pain, anorexia, nausea, vomiting, diarrhea  or change in bowel habits or change in bladder habits, change in stools or change in urine, dysuria, hematuria,  rash, arthralgias, visual complaints, headache, numbness, weakness or ataxia or problems with walking or coordination,  change in mood or  memory.        Current Meds  Medication Sig  . albuterol (PROAIR HFA) 108 (90 Base) MCG/ACT inhaler Inhale 2 puffs into the lungs every 6 (six) hours as needed for wheezing or shortness of breath.  [name] albuterol (PROVENTIL) (2.5 MG/3ML) 0.083% nebulizer solution Take 3 mLs (2.5 mg total) by nebulization every 4 (  four) hours as needed for wheezing or shortness of breath.  . Alirocumab (PRALUENT) 150 MG/ML SOAJ  Inject 1 pen into the skin every 14 (fourteen) days. (Patient taking differently: Inject 150 mg into the skin every 14 (fourteen) days. Tuesdays.)  . aspirin 81 MG chewable tablet Chew 1 tablet (81 mg total) by mouth daily.  . bisoprolol (ZEBETA) 5 MG tablet Take 0.5 tablets (2.5 mg total) by mouth daily.  . clopidogrel (PLAVIX) 75 MG tablet TAKE 1 TABLET(75 MG) BY MOUTH DAILY  . digoxin (LANOXIN) 0.125 MG tablet Take 1 tablet (0.125 mg total) by mouth every other day.  . fluticasone (FLONASE) 50 MCG/ACT nasal spray Place 1 spray into both nostrils daily as needed (head congestion.).   [name] Fluticasone-Umeclidin-Vilant (TRELEGY ELLIPTA) 100-62.5-25 MCG/INH AEPB Inhale 1 puff into the lungs daily.  . furosemide (LASIX) 80 MG tablet Take 1 tablet (80 mg total) by mouth 2 (two) times daily with breakfast and lunch.  . Hypromellose (GENTEAL MILD) 0.2 % SOLN Place 2 drops into both eyes 4 (four) times daily as needed (dry eyes).   [name] ibuprofen (ADVIL) 200 MG tablet Take 200 mg by mouth every 6 (six) hours as needed.  . lansoprazole (PREVACID) 30 MG capsule Take 1 capsule (30 mg total) by mouth daily at 12 noon. (Patient taking differently: Take 30 mg by mouth daily before lunch. )  . losartan (COZAAR) 25 MG tablet Take 1 tablet (25 mg total) by mouth at bedtime.  [name] NASAL SALINE NA Place 1 spray into the nose daily as needed (congestion).   . nitroGLYCERIN (NITROSTAT) 0.4 MG SL tablet Place 1 tablet (0.4 mg total) under the tongue every 5 (five) minutes as needed for chest pain.  . OXYGEN 2lpm with sleep  . ranitidine (ZANTAC) 150 MG capsule Take 150 mg by mouth at bedtime as needed for heartburn.  . simethicone (MYLICON) 80 MG chewable tablet Chew 2 tablets (160 mg total) by mouth every 6 (six) hours as needed for flatulence.  [name] Spacer/Aero-Holding Chambers (AEROCHAMBER MV) inhaler Use as instructed  . Specialty Vitamins Products ([employer] MENOPAUSE SUPPORT PO) Take 1 capsule by mouth daily.  [name] spironolactone  (ALDACTONE) 25 MG tablet Take 0.5 tablets (12.5 mg total) by mouth daily.             Objective:   Physical Exam  amb wf nad   Wt 125  08/24/11 >  11/07/2011  122> 07/14/2012 123 >   01/13/2016   133 > 12/31/2017  120 >  02/14/2018  114> 03/28/2018 > 07/07/2018  101 >  10/08/2018 91     Vital signs reviewed - Note on arrival 02 sats  99% on RA      HEENT: nl dentition / oropharynx. Nl external ear canals without cough reflex -  Mild bilateral non-specific turbinate edema     NECK :  without JVD/Nodes/TM/ nl carotid upstrokes bilaterally   LUNGS: no acc muscle use,  Mild barrel  contour chest wall with bilateral  Distant bs s audible wheeze and  without cough on insp or exp maneuver and mild  Hyperresonant  to  percussion bilaterally     CV:  RRR  no s3 or murmur or increase in P2, and no edema   ABD:  soft and nontender with pos end  insp Hoover's  in the supine position. No bruits or organomegaly appreciated, bowel sounds nl  MS:   Nl gait/  ext warm without deformities, calf tenderness, cyanosis or clubbing No  obvious joint restrictions   SKIN: warm and dry without lesions    NEURO:  alert, approp, nl sensorium with  no motor or cerebellar deficits apparent.                      Assessment & Plan:

## 2018-10-08 NOTE — Patient Instructions (Signed)
Plan A = Automatic = bevespi Take 2 puffs first thing in am and then another 2 puffs about 12 hours later.  If any problems just change to Trelegy    Work on inhaler technique:  relax and gently blow all the way out then take a nice smooth deep breath back in, triggering the inhaler at same time you start breathing in.  Hold for up to 5 seconds if you can.  Rinse and gargle with water when done      Plan B = Backup Only use your albuterol inhaler as a rescue medication to be used if you can't catch your breath by resting or doing a relaxed purse lip breathing pattern.  - The less you use it, the better it will work when you need it. - Ok to use the inhaler up to 2 puffs  every 4 hours if you must but call for appointment if use goes up over your usual need - Don't leave home without it !!  (think of it like the spare tire for your car)   Plan C = Crisis - only use your albuterol nebulizer if you first try Plan B and it fails to help > ok to use the nebulizer up to every 4 hours but if start needing it regularly call for immediate appointment     Please schedule a follow up visit in 6 months but call sooner if needed  with all medications /inhalers/ solutions in hand so we can verify exactly what you are taking. This includes all medications from all doctors and over the counters

## 2018-10-10 ENCOUNTER — Encounter: Payer: Self-pay | Admitting: *Deleted

## 2018-10-12 ENCOUNTER — Encounter: Payer: Self-pay | Admitting: Internal Medicine

## 2018-10-12 NOTE — Assessment & Plan Note (Signed)
02/14/2018  Patient Saturations on Room Air at Rest = 94% then  while Ambulating = 84% - On  2 Liters of oxygen while Ambulating = 94% rec 2lpm 24/7 as of 02/14/2018 due to co-existing chf > stopped on her own as of 02/16/18  -  03/28/2018   Walked RA  2 laps @ approx 223ft each @ moderately fast pace  stopped due to end of study, no desats, no sob     As of 10/08/2018 rec 02  2llpm hs and prn for sats < 90% walking   Adequate control on present rx, reviewed in detail with pt > no change in rx needed     I had an extended discussion with the patient reviewing all relevant studies completed to date and  lasting 15 to 20 minutes of a 25 minute visit    I performed detailed device teaching using a teach back method which extended face to face time for this visit (see above)  Each maintenance medication was reviewed in detail including emphasizing most importantly the difference between maintenance and prns and under what circumstances the prns are to be triggered using an action plan format that is not reflected in the computer generated alphabetically organized AVS which I have not found useful in most complex patients, especially with respiratory illnesses  Please see AVS for specific instructions unique to this visit that I personally wrote and verbalized to the the pt in detail and then reviewed with pt  by my nurse highlighting any  changes in therapy recommended at today's visit to their plan of care.

## 2018-10-12 NOTE — Assessment & Plan Note (Signed)
Quit smoking July 2014   - PFT's 11/07/2011 FEV1  0.96 (51%) ratio 50 and no better p B2, DLCO 78% - PFT's  09/02/2012  FEV1 1.38 (64 % ) ratio 66    with DLCO  73 % corrects to 93 % for alv volume   - quit smoking 09/2012 - Spirometry 12/31/2017  FEV1 0.8 (43%)  Ratio 61 p advair 250 try stiolto 2 pffs each am and return for pfts 6 weeks   - PFT's  01/08/18   FEV1 0.82 (40 % ) ratio 64  p no % improvement from saba p nothing prior to study - unable to perform  DLCO    02/14/2018  17mw 340 meters with desats > resolved as of 03/28/2018  - 03/28/2018  After extensive coaching inhaler device,  effectiveness =    75% with smi (short Ti)  - 06/23/18 started pred daily  - 07/07/2018  After extensive coaching inhaler device,  effectiveness =    90% with elipta so try trelegy one click am as already failed anoro and trelegy is on her formulary  - 08/04/2018  After extensive coaching inhaler device,  effectiveness =    50% short Ti - alpha one Screen 08/04/2018  MM level 214   - 10/08/2018  After extensive coaching inhaler device,  effectiveness =    75% 9 (short Ti)  > try bevespi due to throat irritation on trelegy   Much better breathing since TAVR but having mild upper airway irritation from trelegy so reasonable to try bevespi and Rx as a Group B pt  Advised:  formulary restrictions will be an ongoing challenge for the forseable future and I would be happy to pick an alternative if the pt will first  provide me a list of them -  pt  will need to return here for training for any new device that is required eg dpi vs hfa vs respimat.    In the meantime we can always provide samples so that the patient never runs out of any needed respiratory medications.

## 2018-10-13 ENCOUNTER — Ambulatory Visit: Payer: PPO | Admitting: Interventional Cardiology

## 2018-10-13 ENCOUNTER — Telehealth: Payer: Self-pay | Admitting: Physician Assistant

## 2018-10-13 ENCOUNTER — Other Ambulatory Visit: Payer: Self-pay

## 2018-10-13 ENCOUNTER — Encounter (HOSPITAL_COMMUNITY)
Admission: RE | Admit: 2018-10-13 | Discharge: 2018-10-13 | Disposition: A | Discharge status: Home / Self Care | Payer: PPO | Source: Ambulatory Visit | Attending: Cardiovascular Disease | Admitting: Cardiovascular Disease

## 2018-10-13 DIAGNOSIS — Z952 Presence of prosthetic heart valve: Secondary | ICD-10-CM

## 2018-10-13 NOTE — Telephone Encounter (Signed)
Teresa [name] states she thinks her TAVR incision site is infected. On the far side of the incision, there is a red place and the scab is very thick. This morning, she noticed a trace amount of clear drainage from the site that has not continued to drain. The site is tender and painful when touched but no worse than it has been since the procedure. She has no fever.  Instructed the patient to send a message via MyChart with a picture of the incision and it will be reviewed with Nell Range. She was grateful for assistance and agrees with treatment plan.

## 2018-10-13 NOTE — Telephone Encounter (Signed)
New message:    Patient calling, because she had surgery on 08/26/18 and she would like to get a appt soon. Patient seems to think it is getting infected. Patient states her husband has appt today and she would like for some one to call her. Patient husband name is  Lilyauna Miedema.

## 2018-10-15 ENCOUNTER — Encounter (HOSPITAL_COMMUNITY)
Admission: RE | Admit: 2018-10-15 | Discharge: 2018-10-15 | Disposition: A | Discharge status: Home / Self Care | Payer: PPO | Source: Ambulatory Visit | Attending: Cardiovascular Disease | Admitting: Cardiovascular Disease

## 2018-10-15 ENCOUNTER — Other Ambulatory Visit: Payer: Self-pay

## 2018-10-15 DIAGNOSIS — Z952 Presence of prosthetic heart valve: Secondary | ICD-10-CM | POA: Diagnosis not present

## 2018-10-16 ENCOUNTER — Telehealth: Payer: Self-pay | Admitting: Internal Medicine

## 2018-10-16 ENCOUNTER — Ambulatory Visit: Payer: PPO | Admitting: Thoracic Surgery (Cardiothoracic Vascular Surgery)

## 2018-10-16 VITALS — BP 102/56 | HR 68 | Temp 97.9°F | Resp 16 | Ht 59.5 in | Wt 90.0 lb

## 2018-10-16 DIAGNOSIS — I350 Nonrheumatic aortic (valve) stenosis: Secondary | ICD-10-CM | POA: Diagnosis not present

## 2018-10-16 DIAGNOSIS — J9611 Chronic respiratory failure with hypoxia: Secondary | ICD-10-CM | POA: Diagnosis not present

## 2018-10-16 DIAGNOSIS — Z952 Presence of prosthetic heart valve: Secondary | ICD-10-CM | POA: Diagnosis not present

## 2018-10-16 DIAGNOSIS — R05 Cough: Secondary | ICD-10-CM | POA: Diagnosis not present

## 2018-10-16 NOTE — Telephone Encounter (Signed)
Follow up   Patient would like a call about the incision site. The patient states that the drainage is a yellow/green. Patient would like to know if this is normal? Please advise.

## 2018-10-16 NOTE — Progress Notes (Addendum)
  [facility]   MULTIDISCIPLINARY HEART VALVE TEAM   Subjective:  Patient reached out through MyChart about subclavian incision site being red, tender and draining pus. No fevers, chills or streaking.  Objective:  VS:  BP (!) 102/56 (BP Location: Left Arm, Patient Position: Sitting, Cuff Size: Small)   Pulse 68   Temp 97.9 F (36.6 C)   Resp 16   Ht 4' 11.5" (1.511 m)   Wt 90 lb (40.8 kg)   LMP 03/25/2011   SpO2 94% Comment: RA  BMI 17.87 kg/m  Incision site has dry eschar with mild erythema at lateral end.  Assessment & Plan:  Suture abscess: Dry eschar excised and wound mechanically cleaned out, revealing suture knot surrounded by purulence confirming presence of stitch abscess.. Suture was removed. Site was cleaned and antibiotic ointment was applied. Site was re-bandaged. Cleaning instructions given to patient.   Rexene Alberts, MD 10/16/2018 4:17 PM

## 2018-10-16 NOTE — Telephone Encounter (Signed)
Bonney Leitz, PA is aware and arranging care for pt's incision site today.

## 2018-10-16 NOTE — Telephone Encounter (Signed)
Spoke with the pt  She states that she was unable to take the Bevespi bc it made her feel nervous and jumpy  She went back on the trelegy and breathing is doing well and she feels great  FYI for MW

## 2018-10-17 ENCOUNTER — Other Ambulatory Visit: Payer: Self-pay

## 2018-10-17 ENCOUNTER — Encounter (HOSPITAL_COMMUNITY)
Admission: RE | Admit: 2018-10-17 | Discharge: 2018-10-17 | Disposition: A | Discharge status: Home / Self Care | Payer: PPO | Source: Ambulatory Visit | Attending: Cardiovascular Disease | Admitting: Cardiovascular Disease

## 2018-10-17 DIAGNOSIS — Z952 Presence of prosthetic heart valve: Secondary | ICD-10-CM | POA: Diagnosis not present

## 2018-10-20 ENCOUNTER — Other Ambulatory Visit: Payer: Self-pay

## 2018-10-20 ENCOUNTER — Encounter (HOSPITAL_COMMUNITY)
Admission: RE | Admit: 2018-10-20 | Discharge: 2018-10-20 | Disposition: A | Discharge status: Home / Self Care | Payer: PPO | Source: Ambulatory Visit | Attending: Cardiovascular Disease | Admitting: Cardiovascular Disease

## 2018-10-20 DIAGNOSIS — Z952 Presence of prosthetic heart valve: Secondary | ICD-10-CM | POA: Diagnosis not present

## 2018-10-20 NOTE — Progress Notes (Signed)
Reviewed home exercise guidelines with patient including endpoints, temperature precautions, target heart rate and rate of perceived exertion. Pt is walking 60 minutes on the days that she doesn't exercise at cardiac rehab as her mode of home exercise. Pt voices understanding of instructions given.  Sol Passer, MS, ACSM CEP

## 2018-10-21 ENCOUNTER — Other Ambulatory Visit: Payer: Self-pay | Admitting: Physician Assistant

## 2018-10-22 ENCOUNTER — Other Ambulatory Visit: Payer: Self-pay

## 2018-10-22 ENCOUNTER — Encounter (HOSPITAL_COMMUNITY)
Admission: RE | Admit: 2018-10-22 | Discharge: 2018-10-22 | Disposition: A | Discharge status: Home / Self Care | Payer: PPO | Source: Ambulatory Visit | Attending: Cardiovascular Disease | Admitting: Cardiovascular Disease

## 2018-10-22 DIAGNOSIS — Z952 Presence of prosthetic heart valve: Secondary | ICD-10-CM

## 2018-10-23 ENCOUNTER — Ambulatory Visit
Admission: RE | Admit: 2018-10-23 | Discharge: 2018-10-23 | Disposition: A | Discharge status: Home / Self Care | Payer: PPO | Source: Ambulatory Visit | Attending: Internal Medicine | Admitting: Internal Medicine

## 2018-10-23 DIAGNOSIS — J449 Chronic obstructive pulmonary disease, unspecified: Secondary | ICD-10-CM | POA: Diagnosis not present

## 2018-10-23 DIAGNOSIS — H5213 Myopia, bilateral: Secondary | ICD-10-CM | POA: Diagnosis not present

## 2018-10-23 DIAGNOSIS — H04123 Dry eye syndrome of bilateral lacrimal glands: Secondary | ICD-10-CM | POA: Diagnosis not present

## 2018-10-23 DIAGNOSIS — Z0000 Encounter for general adult medical examination without abnormal findings: Secondary | ICD-10-CM | POA: Diagnosis not present

## 2018-10-23 DIAGNOSIS — Z952 Presence of prosthetic heart valve: Secondary | ICD-10-CM | POA: Diagnosis not present

## 2018-10-23 DIAGNOSIS — M810 Age-related osteoporosis without current pathological fracture: Secondary | ICD-10-CM | POA: Diagnosis not present

## 2018-10-23 DIAGNOSIS — I700 Atherosclerosis of aorta: Secondary | ICD-10-CM | POA: Diagnosis not present

## 2018-10-23 DIAGNOSIS — H2513 Age-related nuclear cataract, bilateral: Secondary | ICD-10-CM | POA: Diagnosis not present

## 2018-10-23 DIAGNOSIS — M85852 Other specified disorders of bone density and structure, left thigh: Secondary | ICD-10-CM

## 2018-10-23 DIAGNOSIS — K219 Gastro-esophageal reflux disease without esophagitis: Secondary | ICD-10-CM | POA: Diagnosis not present

## 2018-10-23 DIAGNOSIS — H52203 Unspecified astigmatism, bilateral: Secondary | ICD-10-CM | POA: Diagnosis not present

## 2018-10-23 DIAGNOSIS — Z1231 Encounter for screening mammogram for malignant neoplasm of breast: Secondary | ICD-10-CM | POA: Diagnosis not present

## 2018-10-23 DIAGNOSIS — H524 Presbyopia: Secondary | ICD-10-CM | POA: Diagnosis not present

## 2018-10-23 DIAGNOSIS — Z1211 Encounter for screening for malignant neoplasm of colon: Secondary | ICD-10-CM | POA: Diagnosis not present

## 2018-10-23 DIAGNOSIS — J0101 Acute recurrent maxillary sinusitis: Secondary | ICD-10-CM | POA: Diagnosis not present

## 2018-10-23 DIAGNOSIS — I340 Nonrheumatic mitral (valve) insufficiency: Secondary | ICD-10-CM | POA: Diagnosis not present

## 2018-10-23 NOTE — Progress Notes (Signed)
Cardiac Individual Treatment Plan  Patient Details  Name: Teresa [name] MRN: [id number] Date of Birth: December 28, 1950 Referring Provider:     CARDIAC REHAB PHASE II ORIENTATION from 10/07/2018 in [facility]  Referring Provider  Lauree Chandler, MD      Initial Encounter Date:    CARDIAC REHAB PHASE II ORIENTATION from 10/07/2018 in [facility]  Date  10/07/18      Visit Diagnosis: 08/26/18 TAVR  Patient's Home Medications on Admission:  Current Outpatient Medications:  .  albuterol (PROAIR HFA) 108 (90 Base) MCG/ACT inhaler, Inhale 2 puffs into the lungs every 6 (six) hours as needed for wheezing or shortness of breath., Disp: , Rfl:  .  albuterol (PROVENTIL) (2.5 MG/3ML) 0.083% nebulizer solution, Take 3 mLs (2.5 mg total) by nebulization every 4 (four) hours as needed for wheezing or shortness of breath., Disp: 75 mL, Rfl: 12 .  Alirocumab (PRALUENT) 150 MG/ML SOAJ, Inject 1 pen into the skin every 14 (fourteen) days. (Patient taking differently: Inject 150 mg into the skin every 14 (fourteen) days. Tuesdays.), Disp: 6 pen, Rfl: 3 .  aspirin 81 MG chewable tablet, Chew 1 tablet (81 mg total) by mouth daily., Disp:  , Rfl:  .  bisoprolol (ZEBETA) 5 MG tablet, Take 0.5 tablets (2.5 mg total) by mouth daily., Disp: 90 tablet, Rfl: 3 .  clindamycin (CLEOCIN) 300 MG capsule, Take 2 capsules (600 mg) 30-60 minutes before dental work., Disp: 2 capsule, Rfl: 1 .  clopidogrel (PLAVIX) 75 MG tablet, TAKE 1 TABLET(75 MG) BY MOUTH DAILY, Disp: 90 tablet, Rfl: 3 .  digoxin (LANOXIN) 0.125 MG tablet, Take 1 tablet (0.125 mg total) by mouth every other day., Disp: , Rfl:  .  fluticasone (FLONASE) 50 MCG/ACT nasal spray, Place 1 spray into both nostrils daily as needed (head congestion.). , Disp: , Rfl: 4 .  Fluticasone-Umeclidin-Vilant (TRELEGY ELLIPTA) 100-62.5-25 MCG/INH AEPB, Inhale 1 puff into the lungs daily., Disp: , Rfl:  .   furosemide (LASIX) 80 MG tablet, Take 1 tablet (80 mg total) by mouth 2 (two) times daily with breakfast and lunch., Disp: 180 tablet, Rfl: 2 .  Hypromellose (GENTEAL MILD) 0.2 % SOLN, Place 2 drops into both eyes 4 (four) times daily as needed (dry eyes). , Disp: , Rfl:  .  ibuprofen (ADVIL) 200 MG tablet, Take 200 mg by mouth every 6 (six) hours as needed., Disp: , Rfl:  .  lansoprazole (PREVACID) 30 MG capsule, Take 1 capsule (30 mg total) by mouth daily at 12 noon. (Patient taking differently: Take 30 mg by mouth daily before lunch. ), Disp: 90 capsule, Rfl: 3 .  losartan (COZAAR) 25 MG tablet, Take 1 tablet (25 mg total) by mouth at bedtime., Disp: 30 tablet, Rfl: 2 .  NASAL SALINE NA, Place 1 spray into the nose daily as needed (congestion). , Disp: , Rfl:  .  nitroGLYCERIN (NITROSTAT) 0.4 MG SL tablet, Place 1 tablet (0.4 mg total) under the tongue every 5 (five) minutes as needed for chest pain., Disp: 25 tablet, Rfl: 4 .  OXYGEN, 2lpm with sleep, Disp: , Rfl:  .  ranitidine (ZANTAC) 150 MG capsule, Take 150 mg by mouth at bedtime as needed for heartburn., Disp: , Rfl:  .  simethicone (MYLICON) 80 MG chewable tablet, Chew 2 tablets (160 mg total) by mouth every 6 (six) hours as needed for flatulence., Disp: 30 tablet, Rfl: 0 .  Spacer/Aero-Holding Chambers (AEROCHAMBER MV) inhaler, Use  as instructed, Disp: 1 each, Rfl: 0 .  Specialty Vitamins Products ([employer] MENOPAUSE SUPPORT PO), Take 1 capsule by mouth daily., Disp: , Rfl:  .  spironolactone (ALDACTONE) 25 MG tablet, Take 0.5 tablets (12.5 mg total) by mouth daily., Disp: 30 tablet, Rfl: 2  Past Medical History: Past Medical History:  Diagnosis Date  . AAA (abdominal aortic aneurysm) ([place])    a. small by cath 2014.  [name] Acute on chronic combined systolic and diastolic heart failure ([place]) 08/19/2018  . Arthritis    "across my hips; buttocks; comes w/the weather" (02/27/2013)  . CAD (coronary artery disease), native coronary artery    a.  10/2012 - rotational atherectomy of LAD c/b dissection s/p PCI to LAD, LCx, LM. b. 12/2012 - s/p overlapping DES to RCA.  . Cataract    "just the beginnings on the right" (02/27/2013)  . Chronic combined systolic and diastolic CHF (congestive heart failure) ([place])   . COPD (chronic obstructive pulmonary disease) ([place])   . Coronary artery disease   . GERD (gastroesophageal reflux disease)   . Hypercholesterolemia   . Ischemic cardiomyopathy    a. EF 45-50%.  . Mass of lung    "small tumor RUL; they are watching it" (02/27/2013)  . Mitral regurgitation   . Peripheral vascular disease, unspecified ([place])   . S/P TAVR (transcatheter aortic valve replacement)    23 mm Edwards Sapien 3 transcatheter heart valve placed via open left transaxillary approach   . Severe aortic stenosis   . Severe mitral regurgitation   . Tachycardia, unspecified   . Tobacco abuse     Tobacco Use: Social History   Tobacco Use  Smoking Status Former Smoker  . Packs/day: 0.50  . Years: 40.00  . Pack years: 20.00  . Types: Cigarettes  . Quit date: 10/08/2012  . Years since quitting: 6.0  Smokeless Tobacco Never Used    Labs: Recent Review Flowsheet Data    Labs for ITP Cardiac and Pulmonary Rehab Latest Ref Rng & Units 01/10/2015 08/18/2015 08/19/2018 08/19/2018 08/26/2018   Cholestrol 125 - 200 mg/dL 94(L) 152 - - -   LDLCALC <130 mg/dL 13 66 - - -   HDL >=46 mg/dL 56 65 - - -   Trlycerides <150 mg/dL 125 105 - - -   PHART 7.350 - 7.450 - - 7.467(H) - 7.346(L)   PCO2ART 32.0 - 48.0 mmHg - - 46.1 - 57.0(H)   HCO3 20.0 - 28.0 mmol/L - - 33.3(H) 30.5(H) 31.2(H)   TCO2 22 - 32 mmol/L - - 35(H) 32 33(H)   O2SAT % - - 99.0 63.0 95.0      Capillary Blood Glucose: Lab Results  Component Value Date   GLUCAP 96 03/03/2013   GLUCAP 78 03/03/2013   GLUCAP 142 (H) 03/02/2013   GLUCAP 167 (H) 03/02/2013   GLUCAP 98 08/22/2012     Exercise Target Goals: Exercise Program Goal: Individual exercise prescription  set using results from initial 6 min walk test and THRR while considering  patient's activity barriers and safety.   Exercise Prescription Goal: Initial exercise prescription builds to 30-45 minutes a day of aerobic activity, 2-3 days per week.  Home exercise guidelines will be given to patient during program as part of exercise prescription that the participant will acknowledge.  Activity Barriers & Risk Stratification: Activity Barriers & Cardiac Risk Stratification - 10/07/18 1256      Activity Barriers & Cardiac Risk Stratification   Activity Barriers  None  Cardiac Risk Stratification  High       6 Minute Walk: 6 Minute Walk    Row Name 10/07/18 0845         6 Minute Walk   Phase  Initial     Distance  1509 feet     Walk Time  6 minutes     # of Rest Breaks  0     MPH  2.86     METS  3.53     RPE  9     Perceived Dyspnea   0     VO2 Peak  12.36     Symptoms  No     Resting HR  59 bpm     Resting BP  108/60     Resting Oxygen Saturation   97 %     Exercise Oxygen Saturation  during 6 min walk  95 %     Max Ex. HR  78 bpm     Max Ex. BP  128/42     2 Minute Post BP  108/42        Oxygen Initial Assessment:   Oxygen Re-Evaluation:   Oxygen Discharge (Final Oxygen Re-Evaluation):   Initial Exercise Prescription: Initial Exercise Prescription - 10/07/18 1200      Date of Initial Exercise RX and Referring Provider   Date  10/07/18    Referring Provider  Lauree Chandler, MD    Expected Discharge Date  11/21/18      Recumbant Bike   Level  1.5    Watts  15    Minutes  15    METs  3.1      NuStep   Level  2    SPM  85    Minutes  15    METs  2.5      Prescription Details   Frequency (times per week)  3    Duration  Progress to 30 minutes of continuous aerobic without signs/symptoms of physical distress      Intensity   THRR 40-80% of Max Heartrate  62-124    Ratings of Perceived Exertion  11-13    Perceived Dyspnea  0-4       Progression   Progression  Continue to progress workloads to maintain intensity without signs/symptoms of physical distress.      Resistance Training   Training Prescription  Yes    Weight  2lbs    Reps  10-15       Perform Capillary Blood Glucose checks as needed.  Exercise Prescription Changes: Exercise Prescription Changes    Row Name 10/13/18 0800 10/20/18 0800           Response to Exercise   Blood Pressure (Admit)  108/62  102/62      Blood Pressure (Exercise)  136/78  118/70      Blood Pressure (Exit)  96/60  94/56      Heart Rate (Admit)  57 bpm  68 bpm      Heart Rate (Exercise)  93 bpm  94 bpm      Heart Rate (Exit)  67 bpm  74 bpm      Rating of Perceived Exertion (Exercise)  13  13      Perceived Dyspnea (Exercise)  0  0      Symptoms  none  none      Comments  Pt tolerated 1st session of exercise well.  -      Duration  Continue with 30  min of aerobic exercise without signs/symptoms of physical distress.  Continue with 30 min of aerobic exercise without signs/symptoms of physical distress.      Intensity  THRR unchanged  THRR unchanged        Progression   Progression  Continue to progress workloads to maintain intensity without signs/symptoms of physical distress.  Continue to progress workloads to maintain intensity without signs/symptoms of physical distress.      Average METs  2.8  2.8        Resistance Training   Training Prescription  Yes  Yes      Weight  2lbs  2lbs      Reps  10-15  10-15      Time  10 Minutes  10 Minutes        Interval Training   Interval Training  No  No        Recumbant Bike   Level  1.5  1.5      Minutes  15  15      METs  2.5  2.4        NuStep   Level  2  2      SPM  85  85      Minutes  15  15      METs  3  3.2        Home Exercise Plan   Plans to continue exercise at  -  Home (comment) Walking 60 minutes      Frequency  -  Add 4 additional days to program exercise sessions.      Initial Home Exercises Provided   -  10/20/18         Exercise Comments: Exercise Comments    Row Name 10/13/18 0818 10/20/18 0717         Exercise Comments  Patient tolerated 1st session of exercise well without symptoms.  Reviewed home exercise guidelines, METs, and goals with patient.         Exercise Goals and Review: Exercise Goals    Row Name 10/07/18 0820             Exercise Goals   Increase Physical Activity  Yes       Intervention  Provide advice, education, support and counseling about physical activity/exercise needs.;Develop an individualized exercise prescription for aerobic and resistive training based on initial evaluation findings, risk stratification, comorbidities and participant's personal goals.       Expected Outcomes  Short Term: Attend rehab on a regular basis to increase amount of physical activity.;Long Term: Exercising regularly at least 3-5 days a week.;Long Term: Add in home exercise to make exercise part of routine and to increase amount of physical activity.       Increase Strength and Stamina  Yes       Intervention  Provide advice, education, support and counseling about physical activity/exercise needs.;Develop an individualized exercise prescription for aerobic and resistive training based on initial evaluation findings, risk stratification, comorbidities and participant's personal goals.       Expected Outcomes  Short Term: Increase workloads from initial exercise prescription for resistance, speed, and METs.;Short Term: Perform resistance training exercises routinely during rehab and add in resistance training at home;Long Term: Improve cardiorespiratory fitness, muscular endurance and strength as measured by increased METs and functional capacity (6MWT)       Able to understand and use rate of perceived exertion (RPE) scale  Yes       Intervention  Provide  education and explanation on how to use RPE scale       Expected Outcomes  Short Term: Able to use RPE daily in rehab to  express subjective intensity level;Long Term:  Able to use RPE to guide intensity level when exercising independently       Knowledge and understanding of Target Heart Rate Range (THRR)  Yes       Intervention  Provide education and explanation of THRR including how the numbers were predicted and where they are located for reference       Expected Outcomes  Short Term: Able to state/look up THRR;Long Term: Able to use THRR to govern intensity when exercising independently;Short Term: Able to use daily as guideline for intensity in rehab       Able to check pulse independently  Yes       Intervention  Provide education and demonstration on how to check pulse in carotid and radial arteries.;Review the importance of being able to check your own pulse for safety during independent exercise       Expected Outcomes  Long Term: Able to check pulse independently and accurately;Short Term: Able to explain why pulse checking is important during independent exercise       Understanding of Exercise Prescription  Yes       Intervention  Provide education, explanation, and written materials on patient's individual exercise prescription       Expected Outcomes  Short Term: Able to explain program exercise prescription;Long Term: Able to explain home exercise prescription to exercise independently          Exercise Goals Re-Evaluation : Exercise Goals Re-Evaluation    Row Name 10/13/18 0818 10/20/18 0017           Exercise Goal Re-Evaluation   Exercise Goals Review  Able to understand and use rate of perceived exertion (RPE) scale;Increase Physical Activity  Able to understand and use rate of perceived exertion (RPE) scale;Increase Physical Activity;Understanding of Exercise Prescription;Knowledge and understanding of Target Heart Rate Range (THRR);Increase Strength and Stamina      Comments  Patient able to understand and use RPE scale apprpriately.  Reviewed home exercise guidelines with patient including  THRR, RPE scale, and endpoints for exercise. Pt is walking 60 minutes on the days that she doesn't exercise at cardiac rehab. Pt has 2lb weights at home that she is using for her resistance training. Instructed patient on how to count pulse and handout given. Discussed MET level, and pt's goal is to achieve at least 4 METs.      Expected Outcomes  Increase workloads as tolerated to help improve cardiorespiratory fitness.  Patient will increase workloads as tolerated to help increase cardiorespiratory fitness and achieve personal health and fitness goals.         Discharge Exercise Prescription (Final Exercise Prescription Changes): Exercise Prescription Changes - 10/20/18 0800      Response to Exercise   Blood Pressure (Admit)  102/62    Blood Pressure (Exercise)  118/70    Blood Pressure (Exit)  94/56    Heart Rate (Admit)  68 bpm    Heart Rate (Exercise)  94 bpm    Heart Rate (Exit)  74 bpm    Rating of Perceived Exertion (Exercise)  13    Perceived Dyspnea (Exercise)  0    Symptoms  none    Duration  Continue with 30 min of aerobic exercise without signs/symptoms of physical distress.    Intensity  THRR unchanged  Progression   Progression  Continue to progress workloads to maintain intensity without signs/symptoms of physical distress.    Average METs  2.8      Resistance Training   Training Prescription  Yes    Weight  2lbs    Reps  10-15    Time  10 Minutes      Interval Training   Interval Training  No      Recumbant Bike   Level  1.5    Minutes  15    METs  2.4      NuStep   Level  2    SPM  85    Minutes  15    METs  3.2      Home Exercise Plan   Plans to continue exercise at  Home (comment)   Walking 60 minutes   Frequency  Add 4 additional days to program exercise sessions.    Initial Home Exercises Provided  10/20/18       Nutrition:  Target Goals: Understanding of nutrition guidelines, daily intake of sodium <1574m, cholesterol <2061m calories  30% from fat and 7% or less from saturated fats, daily to have 5 or more servings of fruits and vegetables.  Biometrics: Pre Biometrics - 10/07/18 0824      Pre Biometrics   Height  _0  (1.499 m)    Weight  42.3 kg    Waist Circumference  28.75 inches    Hip Circumference  32.25 inches    Waist to Hip Ratio  0.89 %    BMI (Calculated)  18.83    Triceps Skinfold  13 mm    % Body Fat  28.7 %    Grip Strength  22.5 kg    Flexibility  11 in    Single Leg Stand  9.87 seconds        Nutrition Therapy Plan and Nutrition Goals:   Nutrition Assessments:   Nutrition Goals Re-Evaluation:   Nutrition Goals Re-Evaluation:   Nutrition Goals Discharge (Final Nutrition Goals Re-Evaluation):   Psychosocial: Target Goals: Acknowledge presence or absence of significant depression and/or stress, maximize coping skills, provide positive support system. Participant is able to verbalize types and ability to use techniques and skills needed for reducing stress and depression.  Initial Review & Psychosocial Screening: Initial Psych Review & Screening - 10/07/18 1426      Initial Review   Current issues with  None Identified      Family Dynamics   Good Support System?  Yes   Tanicka has her husband for support.     Barriers   Psychosocial barriers to participate in program  There are no identifiable barriers or psychosocial needs.      Screening Interventions   Interventions  Encouraged to exercise       Quality of Life Scores: Quality of Life - 10/07/18 1301      Quality of Life   Select  Quality of Life      Quality of Life Scores   Health/Function Pre  29.03 %    Socioeconomic Pre  28.75 %    Psych/Spiritual Pre  27.86 %    Family Pre  30 %    GLOBAL Pre  28.87 %      Scores of 19 and below usually indicate a poorer quality of life in these areas.  A difference of  2-3 points is a clinically meaningful difference.  A difference of 2-3 points in the total score of the  Quality of Life Index has been associated with significant improvement in overall quality of life, self-image, physical symptoms, and general health in studies assessing change in quality of life.  PHQ-9: Recent Review Flowsheet Data    Depression screen [facility] 2/9 10/13/2018 12/24/2012   Decreased Interest 0 0   Down, Depressed, Hopeless 0 0   PHQ - 2 Score 0 0     Interpretation of Total Score  Total Score Depression Severity:  1-4 = Minimal depression, 5-9 = Mild depression, 10-14 = Moderate depression, 15-19 = Moderately severe depression, 20-27 = Severe depression   Psychosocial Evaluation and Intervention: Psychosocial Evaluation - 10/13/18 0931      Psychosocial Evaluation & Interventions   Interventions  Encouraged to exercise with the program and follow exercise prescription    Comments  No psychosocial needs identified. No intervention necessary. Daphna enjoys reading and taking care of children.    Expected Outcomes  Claudette will maintain a positive outlook with good coping skills.    Continue Psychosocial Services   No Follow up required       Psychosocial Re-Evaluation: Psychosocial Re-Evaluation    [place] Name 10/20/18 [phone]             Psychosocial Re-Evaluation   Current issues with  None Identified       Comments  No interventions necessary at this time.  Cataleah maintains a positive outlook.       Expected Outcomes  Lillee will continue to maintain a positive outlook with good coping skills.       Interventions  Encouraged to attend Cardiac Rehabilitation for the exercise       Continue Psychosocial Services   No Follow up required          Psychosocial Discharge (Final Psychosocial Re-Evaluation): Psychosocial Re-Evaluation - 10/20/18 9604      Psychosocial Re-Evaluation   Current issues with  None Identified    Comments  No interventions necessary at this time.  Anaisha maintains a positive outlook.    Expected Outcomes  Jermiah will continue to maintain a positive outlook  with good coping skills.    Interventions  Encouraged to attend Cardiac Rehabilitation for the exercise    Continue Psychosocial Services   No Follow up required       Vocational Rehabilitation: Provide vocational rehab assistance to qualifying candidates.   Vocational Rehab Evaluation & Intervention: Vocational Rehab - 10/07/18 1432      Initial Vocational Rehab Evaluation & Intervention   Assessment shows need for Vocational Rehabilitation  No       Education: Education Goals: Education classes will be provided on a weekly basis, covering required topics. Participant will state understanding/return demonstration of topics presented.  Learning Barriers/Preferences: Learning Barriers/Preferences - 10/07/18 1302      Learning Barriers/Preferences   Learning Barriers  Hearing    Learning Preferences  None       Education Topics: Count Your Pulse:  -Group instruction provided by verbal instruction, demonstration, patient participation and written materials to support subject.  Instructors address importance of being able to find your pulse and how to count your pulse when at home without a heart monitor.  Patients get hands on experience counting their pulse with staff help and individually.   Heart Attack, Angina, and Risk Factor Modification:  -Group instruction provided by verbal instruction, video, and written materials to support subject.  Instructors address signs and symptoms of angina and heart attacks.    Also discuss risk factors for  heart disease and how to make changes to improve heart health risk factors.   Functional Fitness:  -Group instruction provided by verbal instruction, demonstration, patient participation, and written materials to support subject.  Instructors address safety measures for doing things around the house.  Discuss how to get up and down off the floor, how to pick things up properly, how to safely get out of a chair without assistance, and balance  training.   Meditation and Mindfulness:  -Group instruction provided by verbal instruction, patient participation, and written materials to support subject.  Instructor addresses importance of mindfulness and meditation practice to help reduce stress and improve awareness.  Instructor also leads participants through a meditation exercise.    Stretching for Flexibility and Mobility:  -Group instruction provided by verbal instruction, patient participation, and written materials to support subject.  Instructors lead participants through series of stretches that are designed to increase flexibility thus improving mobility.  These stretches are additional exercise for major muscle groups that are typically performed during regular warm up and cool down.   Hands Only CPR:  -Group verbal, video, and participation provides a basic overview of AHA guidelines for community CPR. Role-play of emergencies allow participants the opportunity to practice calling for help and chest compression technique with discussion of AED use.   Hypertension: -Group verbal and written instruction that provides a basic overview of hypertension including the most recent diagnostic guidelines, risk factor reduction with self-care instructions and medication management.    Nutrition I class: Heart Healthy Eating:  -Group instruction provided by PowerPoint slides, verbal discussion, and written materials to support subject matter. The instructor gives an explanation and review of the Therapeutic Lifestyle Changes diet recommendations, which includes a discussion on lipid goals, dietary fat, sodium, fiber, plant stanol/sterol esters, sugar, and the components of a well-balanced, healthy diet.   Nutrition II class: Lifestyle Skills:  -Group instruction provided by PowerPoint slides, verbal discussion, and written materials to support subject matter. The instructor gives an explanation and review of label reading, grocery  shopping for heart health, heart healthy recipe modifications, and ways to make healthier choices when eating out.   Diabetes Question & Answer:  -Group instruction provided by PowerPoint slides, verbal discussion, and written materials to support subject matter. The instructor gives an explanation and review of diabetes co-morbidities, pre- and post-prandial blood glucose goals, pre-exercise blood glucose goals, signs, symptoms, and treatment of hypoglycemia and hyperglycemia, and foot care basics.   Diabetes Blitz:  -Group instruction provided by PowerPoint slides, verbal discussion, and written materials to support subject matter. The instructor gives an explanation and review of the physiology behind type 1 and type 2 diabetes, diabetes medications and rational behind using different medications, pre- and post-prandial blood glucose recommendations and Hemoglobin A1c goals, diabetes diet, and exercise including blood glucose guidelines for exercising safely.    Portion Distortion:  -Group instruction provided by PowerPoint slides, verbal discussion, written materials, and food models to support subject matter. The instructor gives an explanation of serving size versus portion size, changes in portions sizes over the last 20 years, and what consists of a serving from each food group.   Stress Management:  -Group instruction provided by verbal instruction, video, and written materials to support subject matter.  Instructors review role of stress in heart disease and how to cope with stress positively.     Exercising on Your Own:  -Group instruction provided by verbal instruction, power point, and written materials to support subject.  Instructors  discuss benefits of exercise, components of exercise, frequency and intensity of exercise, and end points for exercise.  Also discuss use of nitroglycerin and activating EMS.  Review options of places to exercise outside of rehab.  Review guidelines  for sex with heart disease.   Cardiac Drugs I:  -Group instruction provided by verbal instruction and written materials to support subject.  Instructor reviews cardiac drug classes: antiplatelets, anticoagulants, beta blockers, and statins.  Instructor discusses reasons, side effects, and lifestyle considerations for each drug class.   Cardiac Drugs II:  -Group instruction provided by verbal instruction and written materials to support subject.  Instructor reviews cardiac drug classes: angiotensin converting enzyme inhibitors (ACE-I), angiotensin II receptor blockers (ARBs), nitrates, and calcium channel blockers.  Instructor discusses reasons, side effects, and lifestyle considerations for each drug class.   Anatomy and Physiology of the Circulatory System:  Group verbal and written instruction and models provide basic cardiac anatomy and physiology, with the coronary electrical and arterial systems. Review of: AMI, Angina, Valve disease, Heart Failure, Peripheral Artery Disease, Cardiac Arrhythmia, Pacemakers, and the ICD.   Other Education:  -Group or individual verbal, written, or video instructions that support the educational goals of the cardiac rehab program.   Holiday Eating Survival Tips:  -Group instruction provided by PowerPoint slides, verbal discussion, and written materials to support subject matter. The instructor gives patients tips, tricks, and techniques to help them not only survive but enjoy the holidays despite the onslaught of food that accompanies the holidays.   Knowledge Questionnaire Score: Knowledge Questionnaire Score - 10/07/18 1302      Knowledge Questionnaire Score   Pre Score  20/24       Core Components/Risk Factors/Patient Goals at Admission: Personal Goals and Risk Factors at Admission - 10/07/18 1432      Core Components/Risk Factors/Patient Goals on Admission    Weight Management  Weight Maintenance    Improve shortness of breath with ADL's   Yes    Intervention  Provide education, individualized exercise plan and daily activity instruction to help decrease symptoms of SOB with activities of daily living.    Expected Outcomes  Short Term: Improve cardiorespiratory fitness to achieve a reduction of symptoms when performing ADLs;Long Term: Be able to perform more ADLs without symptoms or delay the onset of symptoms    Heart Failure  Yes    Expected Outcomes  Improve functional capacity of life;Short term: Attendance in program 2-3 days a week with increased exercise capacity. Reported lower sodium intake. Reported increased fruit and vegetable intake. Reports medication compliance.;Short term: Daily weights obtained and reported for increase. Utilizing diuretic protocols set by physician.    Lipids  Yes    Intervention  Provide education and support for participant on nutrition & aerobic/resistive exercise along with prescribed medications to achieve LDL <53m, HDL >421m    Expected Outcomes  Short Term: Participant states understanding of desired cholesterol values and is compliant with medications prescribed. Participant is following exercise prescription and nutrition guidelines.;Long Term: Cholesterol controlled with medications as prescribed, with individualized exercise RX and with personalized nutrition plan. Value goals: LDL < 7021mHDL > 40 mg.    Stress  Yes    Intervention  Offer individual and/or small group education and counseling on adjustment to heart disease, stress management and health-related lifestyle change. Teach and support self-help strategies.;Refer participants experiencing significant psychosocial distress to appropriate mental health specialists for further evaluation and treatment. When possible, include family members and significant others in education/counseling  sessions.       Core Components/Risk Factors/Patient Goals Review:  Goals and Risk Factor Review    Row Name 10/13/18 0934             Core  Components/Risk Factors/Patient Goals Review   Personal Goals Review  Weight Management/Obesity;Stress;Heart Failure;Lipids;Improve shortness of breath with ADL's       Review  Pt with multiple CAD RFs willing to participate in CR exercise. Lerae would like to be able to do housework.       Expected Outcomes  Lucky will continue to participate in CR exercise, nutrition, and lifestyle modification opportunities.          Core Components/Risk Factors/Patient Goals at Discharge (Final Review):  Goals and Risk Factor Review - 10/13/18 0934      Core Components/Risk Factors/Patient Goals Review   Personal Goals Review  Weight Management/Obesity;Stress;Heart Failure;Lipids;Improve shortness of breath with ADL's    Review  Pt with multiple CAD RFs willing to participate in CR exercise. Karman would like to be able to do housework.    Expected Outcomes  Laquanda will continue to participate in CR exercise, nutrition, and lifestyle modification opportunities.       ITP Comments: ITP Comments    Row Name 10/07/18 0800 10/13/18 7998         ITP Comments  Medical Director- Dr. Fransico Him, MD  30 Day ITP Review. Nakiah started exercise today and tolerated it well.         Comments: See ITP Comments.

## 2018-10-24 ENCOUNTER — Encounter (HOSPITAL_COMMUNITY)
Admission: RE | Admit: 2018-10-24 | Discharge: 2018-10-24 | Disposition: A | Discharge status: Home / Self Care | Payer: PPO | Source: Ambulatory Visit | Attending: Cardiovascular Disease | Admitting: Cardiovascular Disease

## 2018-10-24 ENCOUNTER — Other Ambulatory Visit: Payer: Self-pay

## 2018-10-24 DIAGNOSIS — Z952 Presence of prosthetic heart valve: Secondary | ICD-10-CM

## 2018-10-27 ENCOUNTER — Other Ambulatory Visit: Payer: Self-pay

## 2018-10-27 ENCOUNTER — Encounter (HOSPITAL_COMMUNITY)
Admission: RE | Admit: 2018-10-27 | Discharge: 2018-10-27 | Disposition: A | Discharge status: Home / Self Care | Payer: PPO | Source: Ambulatory Visit | Attending: Cardiovascular Disease | Admitting: Cardiovascular Disease

## 2018-10-27 DIAGNOSIS — Z952 Presence of prosthetic heart valve: Secondary | ICD-10-CM | POA: Diagnosis not present

## 2018-10-29 ENCOUNTER — Encounter (HOSPITAL_COMMUNITY)
Admission: RE | Admit: 2018-10-29 | Discharge: 2018-10-29 | Disposition: A | Discharge status: Home / Self Care | Payer: PPO | Source: Ambulatory Visit | Attending: Cardiovascular Disease | Admitting: Cardiovascular Disease

## 2018-10-29 ENCOUNTER — Other Ambulatory Visit: Payer: Self-pay

## 2018-10-29 DIAGNOSIS — Z952 Presence of prosthetic heart valve: Secondary | ICD-10-CM

## 2018-10-31 ENCOUNTER — Encounter (HOSPITAL_COMMUNITY)
Admission: RE | Admit: 2018-10-31 | Discharge: 2018-10-31 | Disposition: A | Discharge status: Home / Self Care | Payer: PPO | Source: Ambulatory Visit | Attending: Cardiovascular Disease | Admitting: Cardiovascular Disease

## 2018-10-31 ENCOUNTER — Other Ambulatory Visit: Payer: Self-pay

## 2018-10-31 DIAGNOSIS — Z952 Presence of prosthetic heart valve: Secondary | ICD-10-CM | POA: Diagnosis not present

## 2018-11-03 ENCOUNTER — Encounter (HOSPITAL_COMMUNITY)
Admission: RE | Admit: 2018-11-03 | Discharge: 2018-11-03 | Disposition: A | Discharge status: Home / Self Care | Payer: PPO | Source: Ambulatory Visit | Attending: Cardiovascular Disease | Admitting: Cardiovascular Disease

## 2018-11-03 ENCOUNTER — Other Ambulatory Visit: Payer: Self-pay

## 2018-11-03 DIAGNOSIS — Z952 Presence of prosthetic heart valve: Secondary | ICD-10-CM

## 2018-11-05 ENCOUNTER — Other Ambulatory Visit: Payer: Self-pay

## 2018-11-05 ENCOUNTER — Encounter (HOSPITAL_COMMUNITY)
Admission: RE | Admit: 2018-11-05 | Discharge: 2018-11-05 | Disposition: A | Discharge status: Home / Self Care | Payer: PPO | Source: Ambulatory Visit | Attending: Cardiovascular Disease | Admitting: Cardiovascular Disease

## 2018-11-05 DIAGNOSIS — Z952 Presence of prosthetic heart valve: Secondary | ICD-10-CM | POA: Insufficient documentation

## 2018-11-07 ENCOUNTER — Other Ambulatory Visit: Payer: Self-pay

## 2018-11-07 ENCOUNTER — Encounter (HOSPITAL_COMMUNITY)
Admission: RE | Admit: 2018-11-07 | Discharge: 2018-11-07 | Disposition: A | Discharge status: Home / Self Care | Payer: PPO | Source: Ambulatory Visit | Attending: Cardiovascular Disease | Admitting: Cardiovascular Disease

## 2018-11-07 DIAGNOSIS — Z952 Presence of prosthetic heart valve: Secondary | ICD-10-CM | POA: Diagnosis not present

## 2018-11-12 ENCOUNTER — Other Ambulatory Visit: Payer: Self-pay

## 2018-11-12 ENCOUNTER — Encounter (HOSPITAL_COMMUNITY)
Admission: RE | Admit: 2018-11-12 | Discharge: 2018-11-12 | Disposition: A | Discharge status: Home / Self Care | Payer: PPO | Source: Ambulatory Visit | Attending: Cardiovascular Disease | Admitting: Cardiovascular Disease

## 2018-11-12 DIAGNOSIS — Z952 Presence of prosthetic heart valve: Secondary | ICD-10-CM

## 2018-11-14 ENCOUNTER — Encounter (HOSPITAL_COMMUNITY)
Admission: RE | Admit: 2018-11-14 | Discharge: 2018-11-14 | Disposition: A | Discharge status: Home / Self Care | Payer: PPO | Source: Ambulatory Visit | Attending: Cardiovascular Disease | Admitting: Cardiovascular Disease

## 2018-11-14 ENCOUNTER — Other Ambulatory Visit: Payer: Self-pay

## 2018-11-14 DIAGNOSIS — Z952 Presence of prosthetic heart valve: Secondary | ICD-10-CM | POA: Diagnosis not present

## 2018-11-16 DIAGNOSIS — R05 Cough: Secondary | ICD-10-CM | POA: Diagnosis not present

## 2018-11-16 DIAGNOSIS — J9611 Chronic respiratory failure with hypoxia: Secondary | ICD-10-CM | POA: Diagnosis not present

## 2018-11-17 ENCOUNTER — Encounter (HOSPITAL_COMMUNITY)
Admission: RE | Admit: 2018-11-17 | Discharge: 2018-11-17 | Disposition: A | Discharge status: Home / Self Care | Payer: PPO | Source: Ambulatory Visit | Attending: Cardiovascular Disease | Admitting: Cardiovascular Disease

## 2018-11-17 ENCOUNTER — Other Ambulatory Visit: Payer: Self-pay

## 2018-11-17 DIAGNOSIS — Z952 Presence of prosthetic heart valve: Secondary | ICD-10-CM

## 2018-11-19 ENCOUNTER — Other Ambulatory Visit: Payer: Self-pay

## 2018-11-19 ENCOUNTER — Encounter (HOSPITAL_COMMUNITY)
Admission: RE | Admit: 2018-11-19 | Discharge: 2018-11-19 | Disposition: A | Discharge status: Home / Self Care | Payer: PPO | Source: Ambulatory Visit | Attending: Cardiovascular Disease | Admitting: Cardiovascular Disease

## 2018-11-19 DIAGNOSIS — Z952 Presence of prosthetic heart valve: Secondary | ICD-10-CM | POA: Diagnosis not present

## 2018-11-20 ENCOUNTER — Telehealth: Payer: Self-pay | Admitting: Physician Assistant

## 2018-11-20 ENCOUNTER — Other Ambulatory Visit: Payer: Self-pay | Admitting: Physician Assistant

## 2018-11-20 NOTE — Telephone Encounter (Signed)
New Message:    Pt says she is going to have her teeth cleaned at the dentist tomorrow. Curt Bears had given her a prescription for Clinddamycin 200 mg.Should she take them and go tomorrow, or does she need more time?

## 2018-11-20 NOTE — Progress Notes (Signed)
Cardiac Individual Treatment Plan  Patient Details  Name: Teresa [name] MRN: [id number] Date of Birth: October 20, 1950 Referring Provider:     CARDIAC REHAB PHASE II ORIENTATION from 10/07/2018 in [place]  Referring Provider  Lauree Chandler, MD      Initial Encounter Date:    CARDIAC REHAB PHASE II ORIENTATION from 10/07/2018 in [place]  Date  10/07/18      Visit Diagnosis: 08/26/18 TAVR  Patient's Home Medications on Admission:  Current Outpatient Medications:  .  albuterol (PROAIR HFA) 108 (90 Base) MCG/ACT inhaler, Inhale 2 puffs into the lungs every 6 (six) hours as needed for wheezing or shortness of breath., Disp: , Rfl:  .  albuterol (PROVENTIL) (2.5 MG/3ML) 0.083% nebulizer solution, Take 3 mLs (2.5 mg total) by nebulization every 4 (four) hours as needed for wheezing or shortness of breath., Disp: 75 mL, Rfl: 12 .  Alirocumab (PRALUENT) 150 MG/ML SOAJ, Inject 1 pen into the skin every 14 (fourteen) days. (Patient taking differently: Inject 150 mg into the skin every 14 (fourteen) days. Tuesdays.), Disp: 6 pen, Rfl: 3 .  aspirin 81 MG chewable tablet, Chew 1 tablet (81 mg total) by mouth daily., Disp:  , Rfl:  .  bisoprolol (ZEBETA) 5 MG tablet, Take 0.5 tablets (2.5 mg total) by mouth daily., Disp: 90 tablet, Rfl: 3 .  clindamycin (CLEOCIN) 300 MG capsule, Take 2 capsules (600 mg) 30-60 minutes before dental work., Disp: 2 capsule, Rfl: 1 .  clopidogrel (PLAVIX) 75 MG tablet, TAKE 1 TABLET(75 MG) BY MOUTH DAILY, Disp: 90 tablet, Rfl: 3 .  digoxin (LANOXIN) 0.125 MG tablet, Take 1 tablet (0.125 mg total) by mouth every other day., Disp: , Rfl:  .  fluticasone (FLONASE) 50 MCG/ACT nasal spray, Place 1 spray into both nostrils daily as needed (head congestion.). , Disp: , Rfl: 4 .  Fluticasone-Umeclidin-Vilant (TRELEGY ELLIPTA) 100-62.5-25 MCG/INH AEPB, Inhale 1 puff into the lungs daily., Disp: , Rfl:  .   furosemide (LASIX) 80 MG tablet, Take 1 tablet (80 mg total) by mouth 2 (two) times daily with breakfast and lunch., Disp: 180 tablet, Rfl: 2 .  Hypromellose (GENTEAL MILD) 0.2 % SOLN, Place 2 drops into both eyes 4 (four) times daily as needed (dry eyes). , Disp: , Rfl:  .  ibuprofen (ADVIL) 200 MG tablet, Take 200 mg by mouth every 6 (six) hours as needed., Disp: , Rfl:  .  lansoprazole (PREVACID) 30 MG capsule, Take 1 capsule (30 mg total) by mouth daily at 12 noon. (Patient taking differently: Take 30 mg by mouth daily before lunch. ), Disp: 90 capsule, Rfl: 3 .  losartan (COZAAR) 25 MG tablet, Take 1 tablet (25 mg total) by mouth at bedtime., Disp: 30 tablet, Rfl: 2 .  NASAL SALINE NA, Place 1 spray into the nose daily as needed (congestion). , Disp: , Rfl:  .  nitroGLYCERIN (NITROSTAT) 0.4 MG SL tablet, Place 1 tablet (0.4 mg total) under the tongue every 5 (five) minutes as needed for chest pain., Disp: 25 tablet, Rfl: 4 .  OXYGEN, 2lpm with sleep, Disp: , Rfl:  .  simethicone (MYLICON) 80 MG chewable tablet, Chew 2 tablets (160 mg total) by mouth every 6 (six) hours as needed for flatulence., Disp: 30 tablet, Rfl: 0 .  Spacer/Aero-Holding Chambers (AEROCHAMBER MV) inhaler, Use as instructed, Disp: 1 each, Rfl: 0 .  Specialty Vitamins Products ([employer] MENOPAUSE SUPPORT PO), Take 1 capsule by mouth daily.,  Disp: , Rfl:  .  spironolactone (ALDACTONE) 25 MG tablet, Take 0.5 tablets (12.5 mg total) by mouth daily., Disp: 30 tablet, Rfl: 2 .  ranitidine (ZANTAC) 150 MG capsule, Take 150 mg by mouth at bedtime as needed for heartburn., Disp: , Rfl:   Past Medical History: Past Medical History:  Diagnosis Date  . AAA (abdominal aortic aneurysm) ([place])    a. small by cath 2014.  [name] Acute on chronic combined systolic and diastolic heart failure ([place]) 08/19/2018  . Arthritis    "across my hips; buttocks; comes w/the weather" (02/27/2013)  . CAD (coronary artery disease), native coronary artery    a.  10/2012 - rotational atherectomy of LAD c/b dissection s/p PCI to LAD, LCx, LM. b. 12/2012 - s/p overlapping DES to RCA.  . Cataract    "just the beginnings on the right" (02/27/2013)  . Chronic combined systolic and diastolic CHF (congestive heart failure) ([place])   . COPD (chronic obstructive pulmonary disease) ([place])   . Coronary artery disease   . GERD (gastroesophageal reflux disease)   . Hypercholesterolemia   . Ischemic cardiomyopathy    a. EF 45-50%.  . Mass of lung    "small tumor RUL; they are watching it" (02/27/2013)  . Mitral regurgitation   . Peripheral vascular disease, unspecified ([place])   . S/P TAVR (transcatheter aortic valve replacement)    23 mm Edwards Sapien 3 transcatheter heart valve placed via open left transaxillary approach   . Severe aortic stenosis   . Severe mitral regurgitation   . Tachycardia, unspecified   . Tobacco abuse     Tobacco Use: Social History   Tobacco Use  Smoking Status Former Smoker  . Packs/day: 0.50  . Years: 40.00  . Pack years: 20.00  . Types: Cigarettes  . Quit date: 10/08/2012  . Years since quitting: 6.1  Smokeless Tobacco Never Used    Labs: Recent Review Flowsheet Data    Labs for ITP Cardiac and Pulmonary Rehab Latest Ref Rng & Units 01/10/2015 08/18/2015 08/19/2018 08/19/2018 08/26/2018   Cholestrol 125 - 200 mg/dL 94(L) 152 - - -   LDLCALC <130 mg/dL 13 66 - - -   HDL >=46 mg/dL 56 65 - - -   Trlycerides <150 mg/dL 125 105 - - -   PHART 7.350 - 7.450 - - 7.467(H) - 7.346(L)   PCO2ART 32.0 - 48.0 mmHg - - 46.1 - 57.0(H)   HCO3 20.0 - 28.0 mmol/L - - 33.3(H) 30.5(H) 31.2(H)   TCO2 22 - 32 mmol/L - - 35(H) 32 33(H)   O2SAT % - - 99.0 63.0 95.0      Capillary Blood Glucose: Lab Results  Component Value Date   GLUCAP 96 03/03/2013   GLUCAP 78 03/03/2013   GLUCAP 142 (H) 03/02/2013   GLUCAP 167 (H) 03/02/2013   GLUCAP 98 08/22/2012     Exercise Target Goals: Exercise Program Goal: Individual exercise prescription  set using results from initial 6 min walk test and THRR while considering  patient's activity barriers and safety.   Exercise Prescription Goal: Initial exercise prescription builds to 30-45 minutes a day of aerobic activity, 2-3 days per week.  Home exercise guidelines will be given to patient during program as part of exercise prescription that the participant will acknowledge.  Activity Barriers & Risk Stratification: Activity Barriers & Cardiac Risk Stratification - 10/07/18 1256      Activity Barriers & Cardiac Risk Stratification   Activity Barriers  None  Cardiac Risk Stratification  High       6 Minute Walk: 6 Minute Walk    Row Name 10/07/18 0845 11/17/18 0905       6 Minute Walk   Phase  Initial  Discharge 11/17/2018 '@0703'     Distance  1509 feet  1735 feet    Distance % Change  -  14.98 %    Walk Time  6 minutes  6 minutes    # of Rest Breaks  0  0    MPH  2.86  3.29    METS  3.53  4.13    RPE  9  9    Perceived Dyspnea   0  0    VO2 Peak  12.36  14.45    Symptoms  No  No    Resting HR  59 bpm  68 bpm    Resting BP  108/60  112/58    Resting Oxygen Saturation   97 %  -    Exercise Oxygen Saturation  during 6 min walk  95 %  -    Max Ex. HR  78 bpm  90 bpm    Max Ex. BP  128/42  148/72    2 Minute Post BP  108/42  98/62       Oxygen Initial Assessment:   Oxygen Re-Evaluation:   Oxygen Discharge (Final Oxygen Re-Evaluation):   Initial Exercise Prescription: Initial Exercise Prescription - 10/07/18 1200      Date of Initial Exercise RX and Referring Provider   Date  10/07/18    Referring Provider  Lauree Chandler, MD    Expected Discharge Date  11/21/18      Recumbant Bike   Level  1.5    Watts  15    Minutes  15    METs  3.1      NuStep   Level  2    SPM  85    Minutes  15    METs  2.5      Prescription Details   Frequency (times per week)  3    Duration  Progress to 30 minutes of continuous aerobic without signs/symptoms of  physical distress      Intensity   THRR 40-80% of Max Heartrate  62-124    Ratings of Perceived Exertion  11-13    Perceived Dyspnea  0-4      Progression   Progression  Continue to progress workloads to maintain intensity without signs/symptoms of physical distress.      Resistance Training   Training Prescription  Yes    Weight  2lbs    Reps  10-15       Perform Capillary Blood Glucose checks as needed.  Exercise Prescription Changes: Exercise Prescription Changes    Row Name 10/13/18 0800 10/20/18 0800 11/17/18 0703         Response to Exercise   Blood Pressure (Admit)  108/62  102/62  112/58     Blood Pressure (Exercise)  136/78  118/70  148/72     Blood Pressure (Exit)  96/60  94/56  98/62     Heart Rate (Admit)  57 bpm  68 bpm  68 bpm     Heart Rate (Exercise)  93 bpm  94 bpm  95 bpm     Heart Rate (Exit)  67 bpm  74 bpm  66 bpm     Rating of Perceived Exertion (Exercise)  13  13  11  Perceived Dyspnea (Exercise)  0  0  0     Symptoms  none  none  none     Comments  Pt tolerated 1st session of exercise well.  -  -     Duration  Continue with 30 min of aerobic exercise without signs/symptoms of physical distress.  Continue with 30 min of aerobic exercise without signs/symptoms of physical distress.  Continue with 30 min of aerobic exercise without signs/symptoms of physical distress.     Intensity  THRR unchanged  THRR unchanged  THRR unchanged       Progression   Progression  Continue to progress workloads to maintain intensity without signs/symptoms of physical distress.  Continue to progress workloads to maintain intensity without signs/symptoms of physical distress.  Continue to progress workloads to maintain intensity without signs/symptoms of physical distress.     Average METs  2.8  2.8  3.1       Resistance Training   Training Prescription  Yes  Yes  Yes     Weight  2lbs  2lbs  3lbs     Reps  10-15  10-15  10-15     Time  10 Minutes  10 Minutes  10  Minutes       Interval Training   Interval Training  No  No  No       Recumbant Bike   Level  1.5  1.5  2.5     Minutes  '15  15  15     ' METs  2.5  2.4  2.6       NuStep   Level  '2  2  3     ' SPM  85  85  85     Minutes  '15  15  15     ' METs  3  3.2  3.3       Track   Laps  -  -  1735 6 minute walk test     Minutes  -  -  6     METs  -  -  3.52       Home Exercise Plan   Plans to continue exercise at  -  Home (comment) Walking 60 minutes  Home (comment) Walking 60 minutes     Frequency  -  Add 4 additional days to program exercise sessions.  Add 4 additional days to program exercise sessions.     Initial Home Exercises Provided  -  10/20/18  10/20/18        Exercise Comments: Exercise Comments    Row Name 10/13/18 0818 10/20/18 0717 11/17/18 0713       Exercise Comments  Patient tolerated 1st session of exercise well without symptoms.  Reviewed home exercise guidelines, METs, and goals with patient.  Reviewed METs and goals with patient.        Exercise Goals and Review: Exercise Goals    Row Name 10/07/18 0820             Exercise Goals   Increase Physical Activity  Yes       Intervention  Provide advice, education, support and counseling about physical activity/exercise needs.;Develop an individualized exercise prescription for aerobic and resistive training based on initial evaluation findings, risk stratification, comorbidities and participant's personal goals.       Expected Outcomes  Short Term: Attend rehab on a regular basis to increase amount of physical activity.;Long Term: Exercising regularly at least 3-5 days a week.;Long Term: Add in  home exercise to make exercise part of routine and to increase amount of physical activity.       Increase Strength and Stamina  Yes       Intervention  Provide advice, education, support and counseling about physical activity/exercise needs.;Develop an individualized exercise prescription for aerobic and resistive training  based on initial evaluation findings, risk stratification, comorbidities and participant's personal goals.       Expected Outcomes  Short Term: Increase workloads from initial exercise prescription for resistance, speed, and METs.;Short Term: Perform resistance training exercises routinely during rehab and add in resistance training at home;Long Term: Improve cardiorespiratory fitness, muscular endurance and strength as measured by increased METs and functional capacity (6MWT)       Able to understand and use rate of perceived exertion (RPE) scale  Yes       Intervention  Provide education and explanation on how to use RPE scale       Expected Outcomes  Short Term: Able to use RPE daily in rehab to express subjective intensity level;Long Term:  Able to use RPE to guide intensity level when exercising independently       Knowledge and understanding of Target Heart Rate Range (THRR)  Yes       Intervention  Provide education and explanation of THRR including how the numbers were predicted and where they are located for reference       Expected Outcomes  Short Term: Able to state/look up THRR;Long Term: Able to use THRR to govern intensity when exercising independently;Short Term: Able to use daily as guideline for intensity in rehab       Able to check pulse independently  Yes       Intervention  Provide education and demonstration on how to check pulse in carotid and radial arteries.;Review the importance of being able to check your own pulse for safety during independent exercise       Expected Outcomes  Long Term: Able to check pulse independently and accurately;Short Term: Able to explain why pulse checking is important during independent exercise       Understanding of Exercise Prescription  Yes       Intervention  Provide education, explanation, and written materials on patient's individual exercise prescription       Expected Outcomes  Short Term: Able to explain program exercise prescription;Long  Term: Able to explain home exercise prescription to exercise independently          Exercise Goals Re-Evaluation : Exercise Goals Re-Evaluation    Row Name 10/13/18 0818 10/20/18 0017 11/17/18 0713         Exercise Goal Re-Evaluation   Exercise Goals Review  Able to understand and use rate of perceived exertion (RPE) scale;Increase Physical Activity  Able to understand and use rate of perceived exertion (RPE) scale;Increase Physical Activity;Understanding of Exercise Prescription;Knowledge and understanding of Target Heart Rate Range (THRR);Increase Strength and Stamina  Able to understand and use rate of perceived exertion (RPE) scale;Increase Physical Activity;Understanding of Exercise Prescription;Knowledge and understanding of Target Heart Rate Range (THRR);Increase Strength and Stamina     Comments  Patient able to understand and use RPE scale apprpriately.  Reviewed home exercise guidelines with patient including THRR, RPE scale, and endpoints for exercise. Pt is walking 60 minutes on the days that she doesn't exercise at cardiac rehab. Pt has 2lb weights at home that she is using for her resistance training. Instructed patient on how to count pulse and handout given. Discussed MET level,  and pt's goal is to achieve at least 4 METs.  Patient will be completing the phase 2 cardiac rehab program on Friday and has progressed well. Pt's functional capacity improved 15% as measured by 6MWT and strength improved 6% as measured by grip strength test. Pt feels like she's doing well and will conitnue walking 60 minutes daily in addition to stretching and resistance training use 2lb hand weights at home.     Expected Outcomes  Increase workloads as tolerated to help improve cardiorespiratory fitness.  Patient will increase workloads as tolerated to help increase cardiorespiratory fitness and achieve personal health and fitness goals.  Patient will exercise daily, walking 60 minutes, stretching and  resistance training to maintain strength and stamina.        Discharge Exercise Prescription (Final Exercise Prescription Changes): Exercise Prescription Changes - 11/17/18 0703      Response to Exercise   Blood Pressure (Admit)  112/58    Blood Pressure (Exercise)  148/72    Blood Pressure (Exit)  98/62    Heart Rate (Admit)  68 bpm    Heart Rate (Exercise)  95 bpm    Heart Rate (Exit)  66 bpm    Rating of Perceived Exertion (Exercise)  11    Perceived Dyspnea (Exercise)  0    Symptoms  none    Duration  Continue with 30 min of aerobic exercise without signs/symptoms of physical distress.    Intensity  THRR unchanged      Progression   Progression  Continue to progress workloads to maintain intensity without signs/symptoms of physical distress.    Average METs  3.1      Resistance Training   Training Prescription  Yes    Weight  3lbs    Reps  10-15    Time  10 Minutes      Interval Training   Interval Training  No      Recumbant Bike   Level  2.5    Minutes  15    METs  2.6      NuStep   Level  3    SPM  85    Minutes  15    METs  3.3      Track   Laps  1735   6 minute walk test   Minutes  6    METs  3.52      Home Exercise Plan   Plans to continue exercise at  Home (comment)   Walking 60 minutes   Frequency  Add 4 additional days to program exercise sessions.    Initial Home Exercises Provided  10/20/18       Nutrition:  Target Goals: Understanding of nutrition guidelines, daily intake of sodium <151m, cholesterol <2042m calories 30% from fat and 7% or less from saturated fats, daily to have 5 or more servings of fruits and vegetables.  Biometrics: Pre Biometrics - 10/07/18 0824      Pre Biometrics   Height  '4\' 11"'  (1.499 m)    Weight  42.3 kg    Waist Circumference  28.75 inches    Hip Circumference  32.25 inches    Waist to Hip Ratio  0.89 %    BMI (Calculated)  18.83    Triceps Skinfold  13 mm    % Body Fat  28.7 %    Grip Strength   22.5 kg    Flexibility  11 in    Single Leg Stand  9.87 seconds  Post Biometrics - 11/17/18 0713       Post  Biometrics   Waist Circumference  28.5 inches    Hip Circumference  32.5 inches    Waist to Hip Ratio  0.88 %    Triceps Skinfold  13 mm    Grip Strength  23.75 kg    Flexibility  10.75 in    Single Leg Stand  17.75 seconds       Nutrition Therapy Plan and Nutrition Goals:   Nutrition Assessments:   Nutrition Goals Re-Evaluation:   Nutrition Goals Re-Evaluation:   Nutrition Goals Discharge (Final Nutrition Goals Re-Evaluation):   Psychosocial: Target Goals: Acknowledge presence or absence of significant depression and/or stress, maximize coping skills, provide positive support system. Participant is able to verbalize types and ability to use techniques and skills needed for reducing stress and depression.  Initial Review & Psychosocial Screening: Initial Psych Review & Screening - 10/07/18 1426      Initial Review   Current issues with  None Identified      Family Dynamics   Good Support System?  Yes   Janautica has her husband for support.     Barriers   Psychosocial barriers to participate in program  There are no identifiable barriers or psychosocial needs.      Screening Interventions   Interventions  Encouraged to exercise       Quality of Life Scores: Quality of Life - 11/17/18 0902      Quality of Life   Select  Quality of Life      Quality of Life Scores   Health/Function Pre  29.03 %    Health/Function Post  29.6 %    Health/Function % Change  1.96 %    Socioeconomic Pre  28.75 %    Socioeconomic Post  30 %    Socioeconomic % Change   4.35 %    Psych/Spiritual Pre  27.86 %    Psych/Spiritual Post  30 %    Psych/Spiritual % Change  7.68 %    Family Pre  30 %    Family Post  30 %    Family % Change  0 %    GLOBAL Pre  28.87 %    GLOBAL Post  29.83 %    GLOBAL % Change  3.33 %      Scores of 19 and below usually indicate a  poorer quality of life in these areas.  A difference of  2-3 points is a clinically meaningful difference.  A difference of 2-3 points in the total score of the Quality of Life Index has been associated with significant improvement in overall quality of life, self-image, physical symptoms, and general health in studies assessing change in quality of life.  PHQ-9: Recent Review Flowsheet Data    Depression screen [facility] 2/9 11/17/2018 10/13/2018   Decreased Interest 0 0   Down, Depressed, Hopeless 0 0   PHQ - 2 Score 0 0     Interpretation of Total Score  Total Score Depression Severity:  1-4 = Minimal depression, 5-9 = Mild depression, 10-14 = Moderate depression, 15-19 = Moderately severe depression, 20-27 = Severe depression   Psychosocial Evaluation and Intervention: Psychosocial Evaluation - 10/13/18 0931      Psychosocial Evaluation & Interventions   Interventions  Encouraged to exercise with the program and follow exercise prescription    Comments  No psychosocial needs identified. No intervention necessary. Valene enjoys reading and taking care of children.  Expected Outcomes  Deetra will maintain a positive outlook with good coping skills.    Continue Psychosocial Services   No Follow up required       Psychosocial Re-Evaluation: Psychosocial Re-Evaluation    [place] Name 10/20/18 [phone] 11/17/18 0102           Psychosocial Re-Evaluation   Current issues with  None Identified  None Identified      Comments  No interventions necessary at this time.  Zeffie maintains a positive outlook.  No interventions necessary at this time.  Lydia maintains a positive outlook.      Expected Outcomes  Atziry will continue to maintain a positive outlook with good coping skills.  Christeena will continue to maintain a positive outlook with good coping skills.      Interventions  Encouraged to attend Cardiac Rehabilitation for the exercise  Encouraged to attend Cardiac Rehabilitation for the exercise      Continue  Psychosocial Services   No Follow up required  No Follow up required         Psychosocial Discharge (Final Psychosocial Re-Evaluation): Psychosocial Re-Evaluation - 11/17/18 0903      Psychosocial Re-Evaluation   Current issues with  None Identified    Comments  No interventions necessary at this time.  Tyanne maintains a positive outlook.    Expected Outcomes  Japleen will continue to maintain a positive outlook with good coping skills.    Interventions  Encouraged to attend Cardiac Rehabilitation for the exercise    Continue Psychosocial Services   No Follow up required       Vocational Rehabilitation: Provide vocational rehab assistance to qualifying candidates.   Vocational Rehab Evaluation & Intervention: Vocational Rehab - 10/07/18 1432      Initial Vocational Rehab Evaluation & Intervention   Assessment shows need for Vocational Rehabilitation  No       Education: Education Goals: Education classes will be provided on a weekly basis, covering required topics. Participant will state understanding/return demonstration of topics presented.  Learning Barriers/Preferences: Learning Barriers/Preferences - 10/07/18 1302      Learning Barriers/Preferences   Learning Barriers  Hearing    Learning Preferences  None       Education Topics: Count Your Pulse:  -Group instruction provided by verbal instruction, demonstration, patient participation and written materials to support subject.  Instructors address importance of being able to find your pulse and how to count your pulse when at home without a heart monitor.  Patients get hands on experience counting their pulse with staff help and individually.   Heart Attack, Angina, and Risk Factor Modification:  -Group instruction provided by verbal instruction, video, and written materials to support subject.  Instructors address signs and symptoms of angina and heart attacks.    Also discuss risk factors for heart disease and how to  make changes to improve heart health risk factors.   Functional Fitness:  -Group instruction provided by verbal instruction, demonstration, patient participation, and written materials to support subject.  Instructors address safety measures for doing things around the house.  Discuss how to get up and down off the floor, how to pick things up properly, how to safely get out of a chair without assistance, and balance training.   Meditation and Mindfulness:  -Group instruction provided by verbal instruction, patient participation, and written materials to support subject.  Instructor addresses importance of mindfulness and meditation practice to help reduce stress and improve awareness.  Instructor also leads participants through a meditation  exercise.    Stretching for Flexibility and Mobility:  -Group instruction provided by verbal instruction, patient participation, and written materials to support subject.  Instructors lead participants through series of stretches that are designed to increase flexibility thus improving mobility.  These stretches are additional exercise for major muscle groups that are typically performed during regular warm up and cool down.   Hands Only CPR:  -Group verbal, video, and participation provides a basic overview of AHA guidelines for community CPR. Role-play of emergencies allow participants the opportunity to practice calling for help and chest compression technique with discussion of AED use.   Hypertension: -Group verbal and written instruction that provides a basic overview of hypertension including the most recent diagnostic guidelines, risk factor reduction with self-care instructions and medication management.    Nutrition I class: Heart Healthy Eating:  -Group instruction provided by PowerPoint slides, verbal discussion, and written materials to support subject matter. The instructor gives an explanation and review of the Therapeutic Lifestyle  Changes diet recommendations, which includes a discussion on lipid goals, dietary fat, sodium, fiber, plant stanol/sterol esters, sugar, and the components of a well-balanced, healthy diet.   Nutrition II class: Lifestyle Skills:  -Group instruction provided by PowerPoint slides, verbal discussion, and written materials to support subject matter. The instructor gives an explanation and review of label reading, grocery shopping for heart health, heart healthy recipe modifications, and ways to make healthier choices when eating out.   Diabetes Question & Answer:  -Group instruction provided by PowerPoint slides, verbal discussion, and written materials to support subject matter. The instructor gives an explanation and review of diabetes co-morbidities, pre- and post-prandial blood glucose goals, pre-exercise blood glucose goals, signs, symptoms, and treatment of hypoglycemia and hyperglycemia, and foot care basics.   Diabetes Blitz:  -Group instruction provided by PowerPoint slides, verbal discussion, and written materials to support subject matter. The instructor gives an explanation and review of the physiology behind type 1 and type 2 diabetes, diabetes medications and rational behind using different medications, pre- and post-prandial blood glucose recommendations and Hemoglobin A1c goals, diabetes diet, and exercise including blood glucose guidelines for exercising safely.    Portion Distortion:  -Group instruction provided by PowerPoint slides, verbal discussion, written materials, and food models to support subject matter. The instructor gives an explanation of serving size versus portion size, changes in portions sizes over the last 20 years, and what consists of a serving from each food group.   Stress Management:  -Group instruction provided by verbal instruction, video, and written materials to support subject matter.  Instructors review role of stress in heart disease and how to cope  with stress positively.     Exercising on Your Own:  -Group instruction provided by verbal instruction, power point, and written materials to support subject.  Instructors discuss benefits of exercise, components of exercise, frequency and intensity of exercise, and end points for exercise.  Also discuss use of nitroglycerin and activating EMS.  Review options of places to exercise outside of rehab.  Review guidelines for sex with heart disease.   Cardiac Drugs I:  -Group instruction provided by verbal instruction and written materials to support subject.  Instructor reviews cardiac drug classes: antiplatelets, anticoagulants, beta blockers, and statins.  Instructor discusses reasons, side effects, and lifestyle considerations for each drug class.   Cardiac Drugs II:  -Group instruction provided by verbal instruction and written materials to support subject.  Instructor reviews cardiac drug classes: angiotensin converting enzyme inhibitors (ACE-I), angiotensin II  receptor blockers (ARBs), nitrates, and calcium channel blockers.  Instructor discusses reasons, side effects, and lifestyle considerations for each drug class.   Anatomy and Physiology of the Circulatory System:  Group verbal and written instruction and models provide basic cardiac anatomy and physiology, with the coronary electrical and arterial systems. Review of: AMI, Angina, Valve disease, Heart Failure, Peripheral Artery Disease, Cardiac Arrhythmia, Pacemakers, and the ICD.   Other Education:  -Group or individual verbal, written, or video instructions that support the educational goals of the cardiac rehab program.   Holiday Eating Survival Tips:  -Group instruction provided by PowerPoint slides, verbal discussion, and written materials to support subject matter. The instructor gives patients tips, tricks, and techniques to help them not only survive but enjoy the holidays despite the onslaught of food that accompanies the  holidays.   Knowledge Questionnaire Score: Knowledge Questionnaire Score - 11/17/18 0903      Knowledge Questionnaire Score   Pre Score  20/24    Post Score  20/24       Core Components/Risk Factors/Patient Goals at Admission: Personal Goals and Risk Factors at Admission - 10/07/18 1432      Core Components/Risk Factors/Patient Goals on Admission    Weight Management  Weight Maintenance    Improve shortness of breath with ADL's  Yes    Intervention  Provide education, individualized exercise plan and daily activity instruction to help decrease symptoms of SOB with activities of daily living.    Expected Outcomes  Short Term: Improve cardiorespiratory fitness to achieve a reduction of symptoms when performing ADLs;Long Term: Be able to perform more ADLs without symptoms or delay the onset of symptoms    Heart Failure  Yes    Expected Outcomes  Improve functional capacity of life;Short term: Attendance in program 2-3 days a week with increased exercise capacity. Reported lower sodium intake. Reported increased fruit and vegetable intake. Reports medication compliance.;Short term: Daily weights obtained and reported for increase. Utilizing diuretic protocols set by physician.    Lipids  Yes    Intervention  Provide education and support for participant on nutrition & aerobic/resistive exercise along with prescribed medications to achieve LDL <6m, HDL >458m    Expected Outcomes  Short Term: Participant states understanding of desired cholesterol values and is compliant with medications prescribed. Participant is following exercise prescription and nutrition guidelines.;Long Term: Cholesterol controlled with medications as prescribed, with individualized exercise RX and with personalized nutrition plan. Value goals: LDL < 7075mHDL > 40 mg.    Stress  Yes    Intervention  Offer individual and/or small group education and counseling on adjustment to heart disease, stress management and  health-related lifestyle change. Teach and support self-help strategies.;Refer participants experiencing significant psychosocial distress to appropriate mental health specialists for further evaluation and treatment. When possible, include family members and significant others in education/counseling sessions.       Core Components/Risk Factors/Patient Goals Review:  Goals and Risk Factor Review    Row Name 10/13/18 0934 11/17/18 0903           Core Components/Risk Factors/Patient Goals Review   Personal Goals Review  Weight Management/Obesity;Stress;Heart Failure;Lipids;Improve shortness of breath with ADL's  Weight Management/Obesity;Stress;Heart Failure;Lipids;Improve shortness of breath with ADL's      Review  Pt with multiple CAD RFs willing to participate in CR exercise. Janye would like to be able to do housework.  Pt with multiple CAD RFs willing to participate in CR exercise. Kaisley will graduate on 11/21/2018.  She  feels that she has increased her strength.      Expected Outcomes  Dennis will continue to participate in CR exercise, nutrition, and lifestyle modification opportunities.  Shatasha will continue to participate in CR exercise, nutrition, and lifestyle modification opportunities.         Core Components/Risk Factors/Patient Goals at Discharge (Final Review):  Goals and Risk Factor Review - 11/17/18 0903      Core Components/Risk Factors/Patient Goals Review   Personal Goals Review  Weight Management/Obesity;Stress;Heart Failure;Lipids;Improve shortness of breath with ADL's    Review  Pt with multiple CAD RFs willing to participate in CR exercise. Jesslynn will graduate on 11/21/2018.  She feels that she has increased her strength.    Expected Outcomes  Sherene will continue to participate in CR exercise, nutrition, and lifestyle modification opportunities.       ITP Comments: ITP Comments    Row Name 10/07/18 0800 10/13/18 0824 11/17/18 0859       ITP Comments  Medical Director-  Dr. Fransico Him, MD  30 Day ITP Review. Khaniyah started exercise today and tolerated it well.  30 Day ITP Review.  Shlonda continues to tolerate exercise well.  She will graduate on 11/21/2018.  She feels that she has increased her strength.        Comments: See ITP Comments.

## 2018-11-20 NOTE — Telephone Encounter (Signed)
Informed the patient she may get her teeth cleaned.  Confirmed with her to take Clinda 600 mg 1 hour prior to dental visit. She was grateful for call back.

## 2018-11-21 ENCOUNTER — Other Ambulatory Visit: Payer: Self-pay

## 2018-11-21 ENCOUNTER — Encounter (HOSPITAL_COMMUNITY)
Admission: RE | Admit: 2018-11-21 | Discharge: 2018-11-21 | Disposition: A | Discharge status: Home / Self Care | Payer: PPO | Source: Ambulatory Visit | Attending: Cardiovascular Disease | Admitting: Cardiovascular Disease

## 2018-11-21 VITALS — BP 108/60 | HR 78 | Temp 98.2°F | Ht 59.0 in | Wt 99.0 lb

## 2018-11-21 DIAGNOSIS — Z952 Presence of prosthetic heart valve: Secondary | ICD-10-CM

## 2018-11-24 ENCOUNTER — Other Ambulatory Visit (HOSPITAL_COMMUNITY): Payer: Self-pay

## 2018-11-24 MED ORDER — DIGOXIN 125 MCG PO TABS: 0.1250 mg | ORAL_TABLET | ORAL | Status: DC

## 2018-11-24 MED ORDER — LOSARTAN POTASSIUM 25 MG PO TABS: 25.0000 mg | ORAL_TABLET | Freq: Every day | ORAL | 2 refills | Status: DC

## 2018-11-24 NOTE — Progress Notes (Signed)
Discharge Progress Report  Patient Details  Name: Teresa [name] MRN: [id number] Date of Birth: 11/14/1950 Referring Provider:     CARDIAC REHAB PHASE II ORIENTATION from 10/07/2018 in [place]  Referring Provider  Lauree Chandler, MD       Number of Visits: 17  Reason for Discharge:  Patient reached a stable level of exercise. Patient independent in their exercise. Patient has met program and personal goals.  Smoking History:  Social History   Tobacco Use  Smoking Status Former Smoker  . Packs/day: 0.50  . Years: 40.00  . Pack years: 20.00  . Types: Cigarettes  . Quit date: 10/08/2012  . 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/18 TAVR  ADL [facility]:   Initial Exercise Prescription: Initial Exercise Prescription - 10/07/18 1200      Date of Initial Exercise RX and Referring Provider   Date  10/07/18    Referring Provider  Lauree Chandler, MD    Expected Discharge Date  11/21/18      Recumbant Bike   Level  1.5    Watts  15    Minutes  15    METs  3.1      NuStep   Level  2    SPM  85    Minutes  15    METs  2.5      Prescription Details   Frequency (times per week)  3    Duration  Progress to 30 minutes of continuous aerobic without signs/symptoms of physical distress      Intensity   THRR 40-80% of Max Heartrate  62-124    Ratings of Perceived Exertion  11-13    Perceived Dyspnea  0-4      Progression   Progression  Continue to progress workloads to maintain intensity without signs/symptoms of physical distress.      Resistance Training   Training Prescription  Yes    Weight  2lbs    Reps  10-15       Discharge Exercise Prescription (Final Exercise Prescription Changes): Exercise Prescription Changes - 11/21/18 0700      Response to Exercise   Blood Pressure (Admit)  108/60    Blood Pressure (Exercise)  138/62    Blood Pressure (Exit)  98/52    Heart Rate  (Admit)  78 bpm    Heart Rate (Exercise)  107 bpm    Heart Rate (Exit)  76 bpm    Rating of Perceived Exertion (Exercise)  11    Perceived Dyspnea (Exercise)  0    Symptoms  none    Duration  Continue with 30 min of aerobic exercise without signs/symptoms of physical distress.    Intensity  THRR unchanged      Progression   Progression  Continue to progress workloads to maintain intensity without signs/symptoms of physical distress.    Average METs  3.2      Resistance Training   Training Prescription  Yes    Weight  3lbs    Reps  10-15    Time  10 Minutes      Interval Training   Interval Training  No      Recumbant Bike   Level  3    Minutes  15    METs  3.2      NuStep   Level  4    SPM  85    Minutes  15  METs  3.2      Track   Laps  --    Minutes  --    METs  --      Home Exercise Plan   Plans to continue exercise at  Home (comment)   Walking 60 minutes   Frequency  Add 4 additional days to program exercise sessions.    Initial Home Exercises Provided  10/20/18       Functional Capacity: 6 Minute Walk    Row Name 10/07/18 0845 11/17/18 0905       6 Minute Walk   Phase  Initial  Discharge 11/17/2018 '@0703'     Distance  1509 feet  1735 feet    Distance % Change  -  14.98 %    Walk Time  6 minutes  6 minutes    # of Rest Breaks  0  0    MPH  2.86  3.29    METS  3.53  4.13    RPE  9  9    Perceived Dyspnea   0  0    VO2 Peak  12.36  14.45    Symptoms  No  No    Resting HR  59 bpm  68 bpm    Resting BP  108/60  112/58    Resting Oxygen Saturation   97 %  -    Exercise Oxygen Saturation  during 6 min walk  95 %  -    Max Ex. HR  78 bpm  90 bpm    Max Ex. BP  128/42  148/72    2 Minute Post BP  108/42  98/62       Psychological, QOL, Others - Outcomes: PHQ 2/9: Depression screen [facility] 2/9 11/17/2018 10/13/2018  Decreased Interest 0 0  Down, Depressed, Hopeless 0 0  PHQ - 2 Score 0 0  Some recent data might be hidden    Quality of  Life: Quality of Life - 11/17/18 0902      Quality of Life   Select  Quality of Life      Quality of Life Scores   Health/Function Pre  29.03 %    Health/Function Post  29.6 %    Health/Function % Change  1.96 %    Socioeconomic Pre  28.75 %    Socioeconomic Post  30 %    Socioeconomic % Change   4.35 %    Psych/Spiritual Pre  27.86 %    Psych/Spiritual Post  30 %    Psych/Spiritual % Change  7.68 %    Family Pre  30 %    Family Post  30 %    Family % Change  0 %    GLOBAL Pre  28.87 %    GLOBAL Post  29.83 %    GLOBAL % Change  3.33 %       Personal Goals: Goals established at orientation with interventions provided to work toward goal. Personal Goals and Risk Factors at Admission - 10/07/18 1432      Core Components/Risk Factors/Patient Goals on Admission    Weight Management  Weight Maintenance    Improve shortness of breath with ADL's  Yes    Intervention  Provide education, individualized exercise plan and daily activity instruction to help decrease symptoms of SOB with activities of daily living.    Expected Outcomes  Short Term: Improve cardiorespiratory fitness to achieve a reduction of symptoms when performing ADLs;Long Term: Be able to perform more ADLs  without symptoms or delay the onset of symptoms    Heart Failure  Yes    Expected Outcomes  Improve functional capacity of life;Short term: Attendance in program 2-3 days a week with increased exercise capacity. Reported lower sodium intake. Reported increased fruit and vegetable intake. Reports medication compliance.;Short term: Daily weights obtained and reported for increase. Utilizing diuretic protocols set by physician.    Lipids  Yes    Intervention  Provide education and support for participant on nutrition & aerobic/resistive exercise along with prescribed medications to achieve LDL <61m, HDL >441m    Expected Outcomes  Short Term: Participant states understanding of desired cholesterol values and is compliant  with medications prescribed. Participant is following exercise prescription and nutrition guidelines.;Long Term: Cholesterol controlled with medications as prescribed, with individualized exercise RX and with personalized nutrition plan. Value goals: LDL < 7078mHDL > 40 mg.    Stress  Yes    Intervention  Offer individual and/or small group education and counseling on adjustment to heart disease, stress management and health-related lifestyle change. Teach and support self-help strategies.;Refer participants experiencing significant psychosocial distress to appropriate mental health specialists for further evaluation and treatment. When possible, include family members and significant others in education/counseling sessions.        Personal Goals Discharge: Goals and Risk Factor Review    Row Name 10/13/18 0934 11/17/18 0903 11/24/18 0715         Core Components/Risk Factors/Patient Goals Review   Personal Goals Review  Weight Management/Obesity;Stress;Heart Failure;Lipids;Improve shortness of breath with ADL's  Weight Management/Obesity;Stress;Heart Failure;Lipids;Improve shortness of breath with ADL's  Weight Management/Obesity;Stress;Heart Failure;Lipids;Improve shortness of breath with ADL's     Review  Pt with multiple CAD RFs willing to participate in CR exercise. Simmie would like to be able to do housework.  Pt with multiple CAD RFs willing to participate in CR exercise. Lakeyta will graduate on 11/21/2018.  She feels that she has increased her strength.  Ahria completed 17 sessions and has graduated from CR.  She feels that she has increased her strength.  She is eager to resume vacuuming.     Expected Outcomes  Mareli will continue to participate in CR exercise, nutrition, and lifestyle modification opportunities.  Jahnaya will continue to participate in CR exercise, nutrition, and lifestyle modification opportunities.  Nickcole will continue to participate in exercise, nutrition, and lifestyle  modification opportunities.  She plans to walk for exercise.        Exercise Goals and Review: Exercise Goals    Row Name 10/07/18 0820             Exercise Goals   Increase Physical Activity  Yes       Intervention  Provide advice, education, support and counseling about physical activity/exercise needs.;Develop an individualized exercise prescription for aerobic and resistive training based on initial evaluation findings, risk stratification, comorbidities and participant's personal goals.       Expected Outcomes  Short Term: Attend rehab on a regular basis to increase amount of physical activity.;Long Term: Exercising regularly at least 3-5 days a week.;Long Term: Add in home exercise to make exercise part of routine and to increase amount of physical activity.       Increase Strength and Stamina  Yes       Intervention  Provide advice, education, support and counseling about physical activity/exercise needs.;Develop an individualized exercise prescription for aerobic and resistive training based on initial evaluation findings, risk stratification, comorbidities and participant's personal goals.  Expected Outcomes  Short Term: Increase workloads from initial exercise prescription for resistance, speed, and METs.;Short Term: Perform resistance training exercises routinely during rehab and add in resistance training at home;Long Term: Improve cardiorespiratory fitness, muscular endurance and strength as measured by increased METs and functional capacity (6MWT)       Able to understand and use rate of perceived exertion (RPE) scale  Yes       Intervention  Provide education and explanation on how to use RPE scale       Expected Outcomes  Short Term: Able to use RPE daily in rehab to express subjective intensity level;Long Term:  Able to use RPE to guide intensity level when exercising independently       Knowledge and understanding of Target Heart Rate Range (THRR)  Yes       Intervention   Provide education and explanation of THRR including how the numbers were predicted and where they are located for reference       Expected Outcomes  Short Term: Able to state/look up THRR;Long Term: Able to use THRR to govern intensity when exercising independently;Short Term: Able to use daily as guideline for intensity in rehab       Able to check pulse independently  Yes       Intervention  Provide education and demonstration on how to check pulse in carotid and radial arteries.;Review the importance of being able to check your own pulse for safety during independent exercise       Expected Outcomes  Long Term: Able to check pulse independently and accurately;Short Term: Able to explain why pulse checking is important during independent exercise       Understanding of Exercise Prescription  Yes       Intervention  Provide education, explanation, and written materials on patient's individual exercise prescription       Expected Outcomes  Short Term: Able to explain program exercise prescription;Long Term: Able to explain home exercise prescription to exercise independently          Exercise Goals Re-Evaluation: Exercise Goals Re-Evaluation    Row Name 10/13/18 0818 10/20/18 0017 11/17/18 0713 11/21/18 0704       Exercise Goal Re-Evaluation   Exercise Goals Review  Able to understand and use rate of perceived exertion (RPE) scale;Increase Physical Activity  Able to understand and use rate of perceived exertion (RPE) scale;Increase Physical Activity;Understanding of Exercise Prescription;Knowledge and understanding of Target Heart Rate Range (THRR);Increase Strength and Stamina  Able to understand and use rate of perceived exertion (RPE) scale;Increase Physical Activity;Understanding of Exercise Prescription;Knowledge and understanding of Target Heart Rate Range (THRR);Increase Strength and Stamina  Able to understand and use rate of perceived exertion (RPE) scale;Increase Physical  Activity;Understanding of Exercise Prescription;Knowledge and understanding of Target Heart Rate Range (THRR);Increase Strength and Stamina    Comments  Patient able to understand and use RPE scale apprpriately.  Reviewed home exercise guidelines with patient including THRR, RPE scale, and endpoints for exercise. Pt is walking 60 minutes on the days that she doesn't exercise at cardiac rehab. Pt has 2lb weights at home that she is using for her resistance training. Instructed patient on how to count pulse and handout given. Discussed MET level, and pt's goal is to achieve at least 4 METs.  Patient will be completing the phase 2 cardiac rehab program on Friday and has progressed well. Pt's functional capacity improved 15% as measured by 6MWT and strength improved 6% as measured by grip strength test.  Pt feels like she's doing well and will conitnue walking 60 minutes daily in addition to stretching and resistance training use 2lb hand weights at home.  Patient completed the phase 2 cardiac rehab program and has progressed well achieving 3.2 METs with exercise. Pt will continue walking 60 minutes daily as her mode of exercise.    Expected Outcomes  Increase workloads as tolerated to help improve cardiorespiratory fitness.  Patient will increase workloads as tolerated to help increase cardiorespiratory fitness and achieve personal health and fitness goals.  Patient will exercise daily, walking 60 minutes, stretching and resistance training to maintain strength and stamina.  Patient will exercise daily, walking 60 minutes, stretching and resistance training to maintain strength and stamina.       Nutrition & Weight - Outcomes: Pre Biometrics - 10/07/18 0824      Pre Biometrics   Height  '4\' 11"'  (1.499 m)    Weight  42.3 kg    Waist Circumference  28.75 inches    Hip Circumference  32.25 inches    Waist to Hip Ratio  0.89 %    BMI (Calculated)  18.83    Triceps Skinfold  13 mm    % Body Fat  28.7 %     Grip Strength  22.5 kg    Flexibility  11 in    Single Leg Stand  9.87 seconds      Post Biometrics - 11/21/18 0707       Post  Biometrics   Height  '4\' 11"'  (1.499 m)    Weight  44.9 kg    Waist Circumference  28.5 inches    Hip Circumference  32.5 inches    Waist to Hip Ratio  0.88 %    BMI (Calculated)  19.98    Triceps Skinfold  13 mm    % Body Fat  29.1 %    Grip Strength  23.75 kg    Flexibility  10.75 in    Single Leg Stand  17.75 seconds       Nutrition:   Nutrition Discharge:   Education Questionnaire Score: Knowledge Questionnaire Score - 11/17/18 0903      Knowledge Questionnaire Score   Pre Score  20/24    Post Score  20/24       Goals reviewed with patient; copy given to patient.

## 2018-12-09 ENCOUNTER — Ambulatory Visit (HOSPITAL_COMMUNITY)
Admission: RE | Admit: 2018-12-09 | Discharge: 2018-12-09 | Disposition: A | Discharge status: Home / Self Care | Payer: PPO | Source: Ambulatory Visit | Attending: Internal Medicine | Admitting: Internal Medicine

## 2018-12-09 ENCOUNTER — Other Ambulatory Visit: Payer: Self-pay

## 2018-12-09 ENCOUNTER — Encounter (HOSPITAL_COMMUNITY): Payer: Self-pay | Admitting: *Deleted

## 2018-12-09 ENCOUNTER — Encounter (HOSPITAL_COMMUNITY): Payer: Self-pay | Admitting: Internal Medicine

## 2018-12-09 VITALS — BP 113/39 | HR 63 | Wt 99.4 lb

## 2018-12-09 DIAGNOSIS — K219 Gastro-esophageal reflux disease without esophagitis: Secondary | ICD-10-CM | POA: Diagnosis not present

## 2018-12-09 DIAGNOSIS — I2510 Atherosclerotic heart disease of native coronary artery without angina pectoris: Secondary | ICD-10-CM

## 2018-12-09 DIAGNOSIS — E7800 Pure hypercholesterolemia, unspecified: Secondary | ICD-10-CM | POA: Insufficient documentation

## 2018-12-09 DIAGNOSIS — I5042 Chronic combined systolic (congestive) and diastolic (congestive) heart failure: Secondary | ICD-10-CM | POA: Insufficient documentation

## 2018-12-09 DIAGNOSIS — I350 Nonrheumatic aortic (valve) stenosis: Secondary | ICD-10-CM | POA: Diagnosis not present

## 2018-12-09 DIAGNOSIS — I5022 Chronic systolic (congestive) heart failure: Secondary | ICD-10-CM

## 2018-12-09 DIAGNOSIS — Z7982 Long term (current) use of aspirin: Secondary | ICD-10-CM | POA: Insufficient documentation

## 2018-12-09 DIAGNOSIS — I340 Nonrheumatic mitral (valve) insufficiency: Secondary | ICD-10-CM

## 2018-12-09 DIAGNOSIS — Z79899 Other long term (current) drug therapy: Secondary | ICD-10-CM | POA: Insufficient documentation

## 2018-12-09 DIAGNOSIS — I739 Peripheral vascular disease, unspecified: Secondary | ICD-10-CM | POA: Diagnosis not present

## 2018-12-09 DIAGNOSIS — Z7902 Long term (current) use of antithrombotics/antiplatelets: Secondary | ICD-10-CM | POA: Diagnosis not present

## 2018-12-09 DIAGNOSIS — M1990 Unspecified osteoarthritis, unspecified site: Secondary | ICD-10-CM | POA: Diagnosis not present

## 2018-12-09 DIAGNOSIS — E785 Hyperlipidemia, unspecified: Secondary | ICD-10-CM | POA: Insufficient documentation

## 2018-12-09 DIAGNOSIS — J449 Chronic obstructive pulmonary disease, unspecified: Secondary | ICD-10-CM | POA: Insufficient documentation

## 2018-12-09 DIAGNOSIS — Z955 Presence of coronary angioplasty implant and graft: Secondary | ICD-10-CM | POA: Insufficient documentation

## 2018-12-09 DIAGNOSIS — Z952 Presence of prosthetic heart valve: Secondary | ICD-10-CM | POA: Diagnosis not present

## 2018-12-09 DIAGNOSIS — I255 Ischemic cardiomyopathy: Secondary | ICD-10-CM | POA: Diagnosis not present

## 2018-12-09 DIAGNOSIS — Z87891 Personal history of nicotine dependence: Secondary | ICD-10-CM | POA: Insufficient documentation

## 2018-12-09 LAB — BASIC METABOLIC PANEL
Anion gap: 12 (ref 5–15)
BUN: 30 mg/dL — ABNORMAL HIGH (ref 8–23)
CO2: 30 mmol/L (ref 22–32)
Calcium: 9.3 mg/dL (ref 8.9–10.3)
Chloride: 96 mmol/L — ABNORMAL LOW (ref 98–111)
Creatinine, Ser: 0.73 mg/dL (ref 0.44–1.00)
GFR calc Af Amer: >60 mL/min (ref >60)
GFR calc non Af Amer: >60 mL/min (ref >60)
Glucose, Bld: 91 mg/dL (ref 70–99)
Potassium: 3.9 mmol/L (ref 3.5–5.1)
Sodium: 138 mmol/L (ref 135–145)

## 2018-12-09 LAB — BRAIN NATRIURETIC PEPTIDE: B Natriuretic Peptide: 201.4 pg/mL — ABNORMAL HIGH (ref 0.0–100.0)

## 2018-12-09 MED ORDER — FUROSEMIDE 80 MG PO TABS: 80.0000 mg | ORAL_TABLET | Freq: Every day | ORAL | 2 refills | Status: DC

## 2018-12-09 MED ORDER — SPIRONOLACTONE 25 MG PO TABS: 12.5000 mg | ORAL_TABLET | Freq: Every day | ORAL | 6 refills | Status: DC

## 2018-12-09 MED ORDER — BISOPROLOL FUMARATE 5 MG PO TABS: 2.5000 mg | ORAL_TABLET | Freq: Every day | ORAL | 6 refills | Status: DC

## 2018-12-09 MED ORDER — LOSARTAN POTASSIUM 25 MG PO TABS: 25.0000 mg | ORAL_TABLET | Freq: Every day | ORAL | 6 refills | Status: DC

## 2018-12-09 NOTE — Addendum Note (Signed)
Encounter addended by: Scarlette Calico, RN on: 12/09/2018 10:06 AM  Actions taken: Order list changed

## 2018-12-09 NOTE — Addendum Note (Signed)
Encounter addended by: Scarlette Calico, RN on: 12/09/2018 9:56 AM  Actions taken: Medication long-term status modified, Charge Capture section accepted, Clinical Note Signed, Order list changed, Diagnosis association updated

## 2018-12-09 NOTE — Patient Instructions (Signed)
Stop Digoxin  Decrease Furosemide to 80 mg daily  Labs done today, we will call you for abnormal results  Please follow up with our heart failure pharmacist in 4 weeks  Your physician recommends that you schedule a follow-up appointment in: 3 months with echocardiogram  If you have any questions or concerns before your next appointment please send [place] a message through [place] or call our office at [phone].  At the [facility], you and your health needs are our priority. As part of our continuing mission to provide you with exceptional heart care, we have created designated Provider Care Teams. These Care Teams include your primary Cardiologist (physician) and Advanced Practice Providers (APPs- Physician Assistants and Nurse Practitioners) who all work together to provide you with the care you need, when you need it.   You may see any of the following providers on your designated Care Team at your next follow up: [name] Dr Glori Bickers . Dr Loralie Champagne . Darrick Grinder, NP   Please be sure to bring in all your medications bottles to every appointment.

## 2018-12-09 NOTE — Progress Notes (Signed)
PCP: Primary Cardiologist: Dr Haroldine Laws   HPI: [name] is a 68 year old with a history of CAD (prior stenting of the LM, LAD and circumflex in the setting of dissection), prior RCA stenting, AAA, chronic bronchitis, chronic systolic and diastolic CHF, GERD, HLD, PAD, aortic stenosis and mitral regurgitation, ischemic cardiomyopathy. Lung cancer 2014 requiring RUL,  and tobacco abuse.   In June she saw Dr Melvyn Novas for increased dyspnea. Dyspne was thought to be from severe AS. She was sent back to [facility] and had a ECHO. ECHO showed worsening EF and severe aortic stenosis + severe mitral regurgitation. She was set up for cath in preparation for TAVR.Presented for pre TAVR cath on 08/22/18 and was admitted due to volume overload. Diuresed with IV lasix and once optimized she underwent TAVR on 6/23. After TAVR she required additional diuresis and HF medication adjustments.   Today she presents for routine f/u.  Feels great. Walking 1 hour per day. No CP or SOB. Edema resolved. Some times has pressure under diaphragm in the evening. Not with exertion. On lasix 80 bid. Gets dizzy at times. Follows BP closely. SBP always around 95.   Echo 09/24/18 EF 30-35%. TAVR ok. Mild MR.   Echo 08/2018 EF 25% , severe aortic stenosis, severe mitral regurgitation.   LHC 08/19/2018 1. Triple vessel CAD.  2. Patent stents in the left main, LAD and Circumflex (placed at the time of dissection) 3. Chronic occlusion small Obtuse marginal branch which fills from right to left collaterals 4. Patent mid to distal RCA stent. The proximal RCA has a moderate non-obstructive stenosis which does not appear to be flow limiting. This is unchanged from her last cath.  5. Severe aortic stenosis (mean gradient 21 mmHg, peak to peak gradient 36 mmHg, AVA 0.71 cm2) 6. Elevated filling pressures c/w acute volume overload (RA 9/14,   ROS: All systems negative except as listed in HPI, PMH and Problem List.  SH:  Social History   Socioeconomic History  . Marital status: Married    Spouse name: Not on file  . Number of children: 1  . Years of education: 61  . Highest education level: High school graduate  Occupational History  . 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 Financial resource strain: Not hard at all  . Food insecurity    Worry: Never true    Inability: Never true  . Transportation needs    Medical: No    Non-medical: No  Tobacco Use  . Smoking status: Former Smoker    Packs/day: 0.50    Years: 40.00    Pack years: 20.00    Types: Cigarettes    Quit date: 10/08/2012    Years since quitting: 6.1  . Smokeless tobacco: Never Used  Substance and Sexual Activity  . Alcohol use: No  . Drug use: No  . Sexual activity: Yes  Lifestyle  . Physical activity    Days per week: 4 days    Minutes per session: 30 min  . Stress: Only a little  Relationships  . Social Herbalist on phone: Not on file    Gets together: Not on file    Attends religious service: Not on file    Active member of club or organization: Not on file    Attends meetings of clubs or organizations: Not on file    Relationship status: Not on file  . Intimate partner violence    Fear of current or ex  partner: Not on file    Emotionally abused: Not on file    Physically abused: Not on file    Forced sexual activity: Not on file  Other Topics Concern  . Not on file  Social History Narrative  . Not on file    FH:  Family History  Problem Relation Age of Onset  . Alcohol abuse Father   . Lupus Mother   . Other Sister        Degenerative disc disease  . Migraines Sister   . Pulmonary fibrosis Brother   . Heart attack Neg Hx     Past Medical History:  Diagnosis Date  . AAA (abdominal aortic aneurysm) ([place])    a. small by cath 2014.  [name] Acute on chronic combined systolic and diastolic heart failure ([place]) 08/19/2018  . Arthritis    "across my hips; buttocks; comes w/the weather" (02/27/2013)  . CAD  (coronary artery disease), native coronary artery    a. 10/2012 - rotational atherectomy of LAD c/b dissection s/p PCI to LAD, LCx, LM. b. 12/2012 - s/p overlapping DES to RCA.  . Cataract    "just the beginnings on the right" (02/27/2013)  . Chronic combined systolic and diastolic CHF (congestive heart failure) ([place])   . COPD (chronic obstructive pulmonary disease) ([place])   . Coronary artery disease   . GERD (gastroesophageal reflux disease)   . Hypercholesterolemia   . Ischemic cardiomyopathy    a. EF 45-50%.  . Mass of lung    "small tumor RUL; they are watching it" (02/27/2013)  . Mitral regurgitation   . Peripheral vascular disease, unspecified ([place])   . S/P TAVR (transcatheter aortic valve replacement)    23 mm Edwards Sapien 3 transcatheter heart valve placed via open left transaxillary approach   . Severe aortic stenosis   . Severe mitral regurgitation   . Tachycardia, unspecified   . Tobacco abuse     Current Outpatient Medications  Medication Sig Dispense Refill  . albuterol (PROAIR HFA) 108 (90 Base) MCG/ACT inhaler Inhale 2 puffs into the lungs every 6 (six) hours as needed for wheezing or shortness of breath.    [name] albuterol (PROVENTIL) (2.5 MG/3ML) 0.083% nebulizer solution Take 3 mLs (2.5 mg total) by nebulization every 4 (four) hours as needed for wheezing or shortness of breath. 75 mL 12  . Alirocumab 150 MG/ML SOAJ Inject into the skin. Inject 1 pen into the skin every 14 days(tuesdays)    . aspirin 81 MG chewable tablet Chew 1 tablet (81 mg total) by mouth daily.    . bisoprolol (ZEBETA) 5 MG tablet Take 0.5 tablets (2.5 mg total) by mouth daily. 90 tablet 3  . clindamycin (CLEOCIN) 300 MG capsule Take 2 capsules (600 mg) 30-60 minutes before dental work. 2 capsule 1  . clopidogrel (PLAVIX) 75 MG tablet TAKE 1 TABLET(75 MG) BY MOUTH DAILY 90 tablet 3  . digoxin (LANOXIN) 0.125 MG tablet Take 1 tablet (0.125 mg total) by mouth every other day.    .  Fluticasone-Umeclidin-Vilant (TRELEGY ELLIPTA) 100-62.5-25 MCG/INH AEPB Inhale 1 puff into the lungs daily.    . furosemide (LASIX) 80 MG tablet Take 1 tablet (80 mg total) by mouth 2 (two) times daily with breakfast and lunch. 180 tablet 2  . Hypromellose (GENTEAL MILD) 0.2 % SOLN Place 2 drops into both eyes 4 (four) times daily as needed (dry eyes).     [name] ibuprofen (ADVIL) 200 MG tablet Take 200 mg by mouth every 6 (  six) hours as needed.    . lansoprazole (PREVACID) 30 MG capsule Take 30 mg by mouth daily before lunch.    . losartan (COZAAR) 25 MG tablet Take 1 tablet (25 mg total) by mouth at bedtime. 30 tablet 2  . NASAL SALINE NA Place 1 spray into the nose daily as needed (congestion).     . nitroGLYCERIN (NITROSTAT) 0.4 MG SL tablet Place 1 tablet (0.4 mg total) under the tongue every 5 (five) minutes as needed for chest pain. 25 tablet 4  . OXYGEN 2lpm with sleep    . simethicone (MYLICON) 80 MG chewable tablet Chew 2 tablets (160 mg total) by mouth every 6 (six) hours as needed for flatulence. 30 tablet 0  . Spacer/Aero-Holding Chambers (AEROCHAMBER MV) inhaler Use as instructed 1 each 0  . Specialty Vitamins Products ([employer] MENOPAUSE SUPPORT PO) Take 1 capsule by mouth daily.    [name] spironolactone (ALDACTONE) 25 MG tablet Take 0.5 tablets (12.5 mg total) by mouth daily. 30 tablet 2   No current facility-administered medications for this encounter.     Vitals:   12/09/18 0854  BP: (!) 113/39  Pulse: 63  SpO2: 99%  Weight: 45.1 kg (99 lb 6.4 oz)   Wt Readings from Last 3 Encounters:  12/09/18 45.1 kg (99 lb 6.4 oz)  11/21/18 44.9 kg (98 lb 15.8 oz)  10/16/18 40.8 kg (90 lb)    PHYSICAL EXAM: General:  Well appearing. No resp difficulty HEENT: normal Neck: supple. no JVD. Carotids 2+ bilat; no bruits. No lymphadenopathy or thryomegaly appreciated. Cor: PMI nondisplaced. Regular rate & rhythm. No rubs, gallops or murmurs. Lungs: clear with mildly decreased BS Abdomen: soft,  nontender, nondistended. No hepatosplenomegaly. No bruits or masses. Good bowel sounds. Extremities: no cyanosis, clubbing, rash, edema Neuro: alert & orientedx3, cranial nerves grossly intact. moves all 4 extremities w/o difficulty. Affect pleasant    ASSESSMENT & PLAN: 1. Chronic Systolic HF. ICM + valvular heart disease - previous ECHO with EF 40-45%.  - 08/27/2018 ECHO EF 20-25% with severe MR - Echo 7/20  EF 30-35%. TAVR ok. Mild MR.  - NYHA I-II. Volume status stable/low. Decrease lasix to 80 daily  - Continue spiro - Can stop digoxin -  Continue 2.5 mg bisoprolol. No room to increase with pulse 50.  - Continue losartan 25 qhs - Suspect she may have a burned out LV from advanced aortic stenosis now complicated by severe functional MR. She is s/p TAVR  - Much improved. EF is improving but still at 30-35%. MR better. NYHA I-II Volume status probably a little dry. Decrease lasix to 80 daily. Stop digoxin. See pharmD in one month to see if BP wil permit change to [facility]. If not consider [place].  - Will need repeat echo in 3 months with me. To reassess EF. If not > 35% will need to discuss ICD.    2. Severe AS -S/P TAVR on 08/26/2018  -On asa + plavix. Can drop in 12/20  3. Severe MR - Functional.Improved with TAVR and treatment of HF  4. CAD - No s/s of ischhemia Patent stents on [facility] 6/19.  - consider DAPT and statin  5. COPD - quit smoking 2014 - followed by Dr, Melvyn Novas  6. H/O Lung Cancer - s/p resection 2014  Glori Bickers, MD  9:45 AM

## 2018-12-16 DIAGNOSIS — R05 Cough: Secondary | ICD-10-CM | POA: Diagnosis not present

## 2018-12-16 DIAGNOSIS — J9611 Chronic respiratory failure with hypoxia: Secondary | ICD-10-CM | POA: Diagnosis not present

## 2019-01-05 ENCOUNTER — Inpatient Hospital Stay: Payer: PPO | Attending: Internal Medicine

## 2019-01-05 ENCOUNTER — Ambulatory Visit (HOSPITAL_COMMUNITY)
Admission: RE | Admit: 2019-01-05 | Discharge: 2019-01-05 | Disposition: A | Discharge status: Home / Self Care | Payer: PPO | Source: Ambulatory Visit | Attending: Internal Medicine | Admitting: Internal Medicine

## 2019-01-05 ENCOUNTER — Other Ambulatory Visit: Payer: Self-pay

## 2019-01-05 DIAGNOSIS — Z791 Long term (current) use of non-steroidal anti-inflammatories (NSAID): Secondary | ICD-10-CM | POA: Insufficient documentation

## 2019-01-05 DIAGNOSIS — J449 Chronic obstructive pulmonary disease, unspecified: Secondary | ICD-10-CM | POA: Diagnosis not present

## 2019-01-05 DIAGNOSIS — E7800 Pure hypercholesterolemia, unspecified: Secondary | ICD-10-CM | POA: Diagnosis not present

## 2019-01-05 DIAGNOSIS — Z7982 Long term (current) use of aspirin: Secondary | ICD-10-CM | POA: Diagnosis not present

## 2019-01-05 DIAGNOSIS — I2510 Atherosclerotic heart disease of native coronary artery without angina pectoris: Secondary | ICD-10-CM | POA: Insufficient documentation

## 2019-01-05 DIAGNOSIS — M1990 Unspecified osteoarthritis, unspecified site: Secondary | ICD-10-CM | POA: Insufficient documentation

## 2019-01-05 DIAGNOSIS — I739 Peripheral vascular disease, unspecified: Secondary | ICD-10-CM | POA: Diagnosis not present

## 2019-01-05 DIAGNOSIS — I5042 Chronic combined systolic (congestive) and diastolic (congestive) heart failure: Secondary | ICD-10-CM | POA: Diagnosis not present

## 2019-01-05 DIAGNOSIS — K219 Gastro-esophageal reflux disease without esophagitis: Secondary | ICD-10-CM | POA: Diagnosis not present

## 2019-01-05 DIAGNOSIS — C3411 Malignant neoplasm of upper lobe, right bronchus or lung: Secondary | ICD-10-CM | POA: Insufficient documentation

## 2019-01-05 DIAGNOSIS — C3490 Malignant neoplasm of unspecified part of unspecified bronchus or lung: Secondary | ICD-10-CM | POA: Insufficient documentation

## 2019-01-05 DIAGNOSIS — Z79899 Other long term (current) drug therapy: Secondary | ICD-10-CM | POA: Diagnosis not present

## 2019-01-05 LAB — CBC WITH DIFFERENTIAL (CANCER CENTER ONLY)
Abs Immature Granulocytes: 0.02 10*3/uL (ref 0.00–0.07)
Basophils Absolute: 0.1 10*3/uL (ref 0.0–0.1)
Basophils Relative: 1 %
Eosinophils Absolute: 0.3 10*3/uL (ref 0.0–0.5)
Eosinophils Relative: 4 %
HCT: 36.8 % (ref 36.0–46.0)
Hemoglobin: 11.8 g/dL — ABNORMAL LOW (ref 12.0–15.0)
Immature Granulocytes: 0 %
Lymphocytes Relative: 25 %
Lymphs Abs: 1.8 10*3/uL (ref 0.7–4.0)
MCH: 29.9 pg (ref 26.0–34.0)
MCHC: 32.1 g/dL (ref 30.0–36.0)
MCV: 93.2 fL (ref 80.0–100.0)
Monocytes Absolute: 0.6 10*3/uL (ref 0.1–1.0)
Monocytes Relative: 9 %
Neutro Abs: 4.4 10*3/uL (ref 1.7–7.7)
Neutrophils Relative %: 61 %
Platelet Count: 250 10*3/uL (ref 150–400)
RBC: 3.95 MIL/uL (ref 3.87–5.11)
RDW: 14.1 % (ref 11.5–15.5)
WBC Count: 7.2 10*3/uL (ref 4.0–10.5)
nRBC: 0 % (ref 0.0–0.2)

## 2019-01-05 LAB — CMP (CANCER CENTER ONLY)
ALT: 17 U/L (ref 0–44)
AST: 21 U/L (ref 15–41)
Albumin: 4.1 g/dL (ref 3.5–5.0)
Alkaline Phosphatase: 82 U/L (ref 38–126)
Anion gap: 11 (ref 5–15)
BUN: 30 mg/dL — ABNORMAL HIGH (ref 8–23)
CO2: 32 mmol/L (ref 22–32)
Calcium: 9.5 mg/dL (ref 8.9–10.3)
Chloride: 99 mmol/L (ref 98–111)
Creatinine: 0.89 mg/dL (ref 0.44–1.00)
GFR, Est AFR Am: >60 mL/min (ref >60)
GFR, Estimated: >60 mL/min (ref >60)
Glucose, Bld: 85 mg/dL (ref 70–99)
Potassium: 4.5 mmol/L (ref 3.5–5.1)
Sodium: 142 mmol/L (ref 135–145)
Total Bilirubin: 0.3 mg/dL (ref 0.3–1.2)
Total Protein: 7.9 g/dL (ref 6.5–8.1)

## 2019-01-05 MED ORDER — IOHEXOL 300 MG/ML  SOLN
75.0000 mL | Freq: Once | INTRAMUSCULAR | Status: AC | PRN
  Administered 2019-01-05: 75 mL via INTRAVENOUS

## 2019-01-05 MED ORDER — SODIUM CHLORIDE (PF) 0.9 % IJ SOLN
INTRAMUSCULAR | Status: AC
  Filled 2019-01-05: qty 50

## 2019-01-06 DIAGNOSIS — Z1231 Encounter for screening mammogram for malignant neoplasm of breast: Secondary | ICD-10-CM | POA: Diagnosis not present

## 2019-01-06 DIAGNOSIS — Z6821 Body mass index (BMI) 21.0-21.9, adult: Secondary | ICD-10-CM | POA: Diagnosis not present

## 2019-01-06 DIAGNOSIS — Z124 Encounter for screening for malignant neoplasm of cervix: Secondary | ICD-10-CM | POA: Diagnosis not present

## 2019-01-06 DIAGNOSIS — Z01419 Encounter for gynecological examination (general) (routine) without abnormal findings: Secondary | ICD-10-CM | POA: Diagnosis not present

## 2019-01-07 ENCOUNTER — Encounter: Payer: Self-pay | Admitting: Physician Assistant

## 2019-01-07 ENCOUNTER — Other Ambulatory Visit: Payer: Self-pay

## 2019-01-07 ENCOUNTER — Ambulatory Visit (HOSPITAL_COMMUNITY)
Admission: RE | Admit: 2019-01-07 | Discharge: 2019-01-07 | Disposition: A | Discharge status: Home / Self Care | Payer: PPO | Source: Ambulatory Visit | Attending: Cardiology | Admitting: Cardiology

## 2019-01-07 ENCOUNTER — Inpatient Hospital Stay (HOSPITAL_BASED_OUTPATIENT_CLINIC_OR_DEPARTMENT_OTHER): Payer: PPO | Admitting: Physician Assistant

## 2019-01-07 VITALS — BP 118/49 | HR 61 | Temp 98.5°F | Resp 17 | Ht 59.0 in | Wt 107.4 lb

## 2019-01-07 DIAGNOSIS — I5022 Chronic systolic (congestive) heart failure: Secondary | ICD-10-CM | POA: Insufficient documentation

## 2019-01-07 DIAGNOSIS — C3411 Malignant neoplasm of upper lobe, right bronchus or lung: Secondary | ICD-10-CM

## 2019-01-07 MED ORDER — SPIRONOLACTONE 25 MG PO TABS: 25.0000 mg | ORAL_TABLET | Freq: Every day | ORAL | 11 refills | Status: DC

## 2019-01-07 NOTE — Progress Notes (Signed)
[place] OFFICE PROGRESS NOTE  Leeroy Cha, MD [address] [place] [address]  DIAGNOSIS: Stage IB (T2a., N0, M0) poorly differentiated squamous cell carcinoma, diagnosed in December of 2014.  PRIOR THERAPY: Flexible video bronchoscopy, right upper lobectomy with mediastinal lymph node dissection under the care of Dr. Cyndia Bent.  CURRENT THERAPY: Observation  INTERVAL HISTORY: Teresa [name] 68 y.o. female returns to the clinic for a follow up visit. The patient is feeling well today without any concerning complaints. The patient has a history of stage IB non-small cell lung cancer, squamous carcinoma. She is status post right upper lobectomy in 2014. She has been on observation since that time and she is feeling time. Denies any fever, chills, night sweats, or weight loss. Denies any chest pain, shortness of breath, cough, or hemoptysis. The patient had been experiencing significant shortness of breath in the last year and was found to have aortic stenosis. She had a TAVR in June 2020 and her breathing is significantly better at this time. Denies any nausea, vomiting, diarrhea, or constipation. Denies any headache or visual changes. She recently had a restaging CT scan performed. The patient is here today for evaluation and to review her scan results.   MEDICAL HISTORY: Past Medical History:  Diagnosis Date  . AAA (abdominal aortic aneurysm) ([place])    a. small by cath 2014.  [name] Acute on chronic combined systolic and diastolic heart failure ([place]) 08/19/2018  . Arthritis    "across my hips; buttocks; comes w/the weather" (02/27/2013)  . CAD (coronary artery disease), native coronary artery    a. 10/2012 - rotational atherectomy of LAD c/b dissection s/p PCI to LAD, LCx, LM. b. 12/2012 - s/p overlapping DES to RCA.  . Cataract    "just the beginnings on the right" (02/27/2013)  . Chronic combined systolic and diastolic CHF (congestive heart failure)  ([place])   . COPD (chronic obstructive pulmonary disease) ([place])   . Coronary artery disease   . GERD (gastroesophageal reflux disease)   . Hypercholesterolemia   . Ischemic cardiomyopathy    a. EF 45-50%.  . Mass of lung    "small tumor RUL; they are watching it" (02/27/2013)  . Mitral regurgitation   . Peripheral vascular disease, unspecified ([place])   . S/P TAVR (transcatheter aortic valve replacement)    23 mm Edwards Sapien 3 transcatheter heart valve placed via open left transaxillary approach   . Severe aortic stenosis   . Severe mitral regurgitation   . Tachycardia, unspecified   . Tobacco abuse     ALLERGIES:  is allergic to azithromycin; ceclor [cefaclor]; penicillins; septra [sulfamethoxazole-trimethoprim]; doxycycline; lipitor [atorvastatin]; morphine and related; simvastatin; zetia [ezetimibe]; adhesive [tape]; and vicodin [hydrocodone-acetaminophen].  MEDICATIONS:  Current Outpatient Medications  Medication Sig Dispense Refill  . albuterol (PROAIR HFA) 108 (90 Base) MCG/ACT inhaler Inhale 2 puffs into the lungs every 6 (six) hours as needed for wheezing or shortness of breath.    [name] albuterol (PROVENTIL) (2.5 MG/3ML) 0.083% nebulizer solution Take 3 mLs (2.5 mg total) by nebulization every 4 (four) hours as needed for wheezing or shortness of breath. 75 mL 12  . Alirocumab 150 MG/ML SOAJ Inject into the skin. Inject 1 pen into the skin every 14 days(tuesdays)    . aspirin 81 MG chewable tablet Chew 1 tablet (81 mg total) by mouth daily.    . bisoprolol (ZEBETA) 5 MG tablet Take 0.5 tablets (2.5 mg total) by mouth daily. 15 tablet 6  .  clindamycin (CLEOCIN) 300 MG capsule Take 2 capsules (600 mg) 30-60 minutes before dental work. 2 capsule 1  . clopidogrel (PLAVIX) 75 MG tablet TAKE 1 TABLET(75 MG) BY MOUTH DAILY 90 tablet 3  . Fluticasone-Umeclidin-Vilant (TRELEGY ELLIPTA) 100-62.5-25 MCG/INH AEPB Inhale 1 puff into the lungs daily.    . furosemide (LASIX) 80 MG tablet Take 1  tablet (80 mg total) by mouth daily. 180 tablet 2  . Hypromellose (GENTEAL MILD) 0.2 % SOLN Place 2 drops into both eyes 4 (four) times daily as needed (dry eyes).     [name] ibuprofen (ADVIL) 200 MG tablet Take 200 mg by mouth every 6 (six) hours as needed.    . lansoprazole (PREVACID) 30 MG capsule Take 30 mg by mouth daily before lunch.    . losartan (COZAAR) 25 MG tablet Take 1 tablet (25 mg total) by mouth at bedtime. 30 tablet 6  . NASAL SALINE NA Place 1 spray into the nose daily as needed (congestion).     . nitroGLYCERIN (NITROSTAT) 0.4 MG SL tablet Place 1 tablet (0.4 mg total) under the tongue every 5 (five) minutes as needed for chest pain. 25 tablet 4  . OXYGEN 2lpm with sleep    . simethicone (MYLICON) 80 MG chewable tablet Chew 2 tablets (160 mg total) by mouth every 6 (six) hours as needed for flatulence. 30 tablet 0  . Spacer/Aero-Holding Chambers (AEROCHAMBER MV) inhaler Use as instructed 1 each 0  . Specialty Vitamins Products ([employer] MENOPAUSE SUPPORT PO) Take 1 capsule by mouth daily.    [name] spironolactone (ALDACTONE) 25 MG tablet Take 0.5 tablets (12.5 mg total) by mouth daily. 15 tablet 6   No current facility-administered medications for this visit.     SURGICAL HISTORY:  Past Surgical History:  Procedure Laterality Date  . CARDIAC CATHETERIZATION  08/2018  . CORONARY ANGIOPLASTY WITH STENT PLACEMENT  10/2012; 12/08/2012   "7 + 3" (02/27/2013)  . ESOPHAGOGASTRODUODENOSCOPY (EGD) WITH PROPOFOL N/A 01/11/2015   Procedure: ESOPHAGOGASTRODUODENOSCOPY (EGD) WITH PROPOFOL;  Surgeon: Garlan Fair, MD;  Location: [facility];  Service: Endoscopy;  Laterality: N/A;  . FLEXIBLE BRONCHOSCOPY N/A 03/02/2013   Procedure: FLEXIBLE BRONCHOSCOPY;  Surgeon: Gaye Pollack, MD;  Location: [facility];  Service: Thoracic;  Laterality: N/A;  . FLEXIBLE SIGMOIDOSCOPY N/A 01/11/2015   Procedure: FLEXIBLE SIGMOIDOSCOPY;  Surgeon: Garlan Fair, MD;  Location: [facility];  Service: Endoscopy;   Laterality: N/A;  unable to complete colon-prep issues  . GANGLION CYST EXCISION Left 1975   "wrist"  . LEFT HEART CATH AND CORONARY ANGIOGRAPHY N/A 06/29/2016   Procedure: Left Heart Cath and Coronary Angiography;  Surgeon: Jettie Booze, MD;  Location: [facility] CV LAB;  Service: Cardiovascular;  Laterality: N/A;  . LEFT HEART CATH AND CORONARY ANGIOGRAPHY N/A 08/19/2018   Procedure: LEFT HEART CATH AND CORONARY ANGIOGRAPHY;  Surgeon: Burnell Blanks, MD;  Location: [facility] CV LAB;  Service: Cardiovascular;  Laterality: N/A;  . LEFT HEART CATHETERIZATION WITH CORONARY ANGIOGRAM N/A 12/08/2012   Procedure: LEFT HEART CATHETERIZATION WITH CORONARY ANGIOGRAM;  Surgeon: Jettie Booze, MD;  Location[facility] CATH LAB;  Service: Cardiovascular;  Laterality: N/A;  . MOUTH SURGERY  2010?   "for bone loss" (06/24/2012)  . PERCUTANEOUS CORONARY STENT INTERVENTION (PCI-S) N/A 10/09/2012   Procedure: PERCUTANEOUS CORONARY STENT INTERVENTION (PCI-S);  Surgeon: Jettie Booze, MD;  Location: [facility] CATH LAB;  Service: Cardiovascular;  Laterality: N/A;  . RIGHT HEART CATH N/A 08/19/2018   Procedure: RIGHT HEART  CATH;  Surgeon: Burnell Blanks, MD;  Location: [place] CV LAB;  Service: Cardiovascular;  Laterality: N/A;  . TEE WITHOUT CARDIOVERSION N/A 08/22/2018   Procedure: TRANSESOPHAGEAL ECHOCARDIOGRAM (TEE);  Surgeon: Pixie Casino, MD;  Location: [facility] ENDOSCOPY;  Service: Cardiovascular;  Laterality: N/A;  . TEE WITHOUT CARDIOVERSION N/A 08/26/2018   Procedure: TRANSESOPHAGEAL ECHOCARDIOGRAM (TEE);  Surgeon: Burnell Blanks, MD;  Location: [facility];  Service: Open Heart Surgery;  Laterality: N/A;  . THORACOTOMY/LOBECTOMY Right 03/02/2013   Procedure: Right Video Assisted Thoracoscopy/Thoracotomy with upper Lobectomy;  Surgeon: Gaye Pollack, MD;  Location: [facility] OR;  Service: Thoracic;  Laterality: Right;  Right Lung Upper  Lobectomy   . TONSILLECTOMY  1960  . VIDEO  BRONCHOSCOPY  09/12/2011   Procedure: VIDEO BRONCHOSCOPY WITH FLUORO;  Surgeon: Tanda Rockers, MD;  Location: [facility];  Service: Cardiopulmonary;  Laterality: Bilateral;    REVIEW OF SYSTEMS:   Review of Systems  Constitutional: Negative for appetite change, chills, fatigue, fever and unexpected weight change.  HENT: Negative for mouth sores, nosebleeds, sore throat and trouble swallowing.   Eyes: Negative for eye problems and icterus.  Respiratory: Negative for cough, hemoptysis, shortness of breath and wheezing.   Cardiovascular: Negative for chest pain and leg swelling.  Gastrointestinal: Negative for abdominal pain, constipation, diarrhea, nausea and vomiting.  Genitourinary: Negative for bladder incontinence, difficulty urinating, dysuria, frequency and hematuria.   Musculoskeletal: Negative for back pain, gait problem, neck pain and neck stiffness.  Skin: Negative for itching and rash.  Neurological: Negative for dizziness, extremity weakness, gait problem, headaches, light-headedness and seizures.  Hematological: Negative for adenopathy. Does not bruise/bleed easily.  Psychiatric/Behavioral: Negative for confusion, depression and sleep disturbance. The patient is not nervous/anxious.     PHYSICAL EXAMINATION:  Blood pressure (!) 118/49, pulse 61, temperature 98.5 F (36.9 C), temperature source Temporal, resp. rate 17, height 4\' 11"  (1.499 m), weight 107 lb 6.4 oz (48.7 kg), last menstrual period 03/25/2011, SpO2 100 %.  ECOG PERFORMANCE STATUS: 1 - Symptomatic but completely ambulatory  Physical Exam  Constitutional: Oriented to person, place, and time and well-developed, well-nourished, and in no distress. No distress.  HENT:  Head: Normocephalic and atraumatic.  Mouth/Throat: Oropharynx is clear and moist. No oropharyngeal exudate.  Eyes: Conjunctivae are normal. Right eye exhibits no discharge. Left eye exhibits no discharge. No scleral icterus.  Neck: Normal range of  motion. Neck supple.  Cardiovascular: Normal rate, regular rhythm, normal heart sounds and intact distal pulses.   Pulmonary/Chest: Effort normal and breath sounds normal. No respiratory distress. No wheezes. No rales.  Abdominal: Soft. Bowel sounds are normal. Exhibits no distension and no mass. There is no tenderness.  Musculoskeletal: Normal range of motion. Exhibits no edema.  Lymphadenopathy:    No cervical adenopathy.  Neurological: Alert and oriented to person, place, and time. Exhibits normal muscle tone. Gait normal. Coordination normal.  Skin: Skin is warm and dry. No rash noted. Not diaphoretic. No erythema. No pallor.  Psychiatric: Mood, memory and judgment normal.  Vitals reviewed.  LABORATORY DATA: Lab Results  Component Value Date   WBC 7.2 01/05/2019   HGB 11.8 (L) 01/05/2019   HCT 36.8 01/05/2019   MCV 93.2 01/05/2019   PLT 250 01/05/2019      Chemistry      Component Value Date/Time   NA 142 01/05/2019 0950   NA 136 09/03/2018 1122   NA 140 01/02/2017 0737   K 4.5 01/05/2019 0950   K 4.4 01/02/2017  0737   CL 99 01/05/2019 0950   CO2 32 01/05/2019 0950   CO2 29 01/02/2017 0737   BUN 30 (H) 01/05/2019 0950   BUN 16 09/03/2018 1122   BUN 17.2 01/02/2017 0737   CREATININE 0.89 01/05/2019 0950   CREATININE 0.7 01/02/2017 0737      Component Value Date/Time   CALCIUM 9.5 01/05/2019 0950   CALCIUM 9.7 01/02/2017 0737   ALKPHOS 82 01/05/2019 0950   ALKPHOS 82 01/02/2017 0737   AST 21 01/05/2019 0950   AST 16 01/02/2017 0737   ALT 17 01/05/2019 0950   ALT 13 01/02/2017 0737   BILITOT 0.3 01/05/2019 0950   BILITOT 0.76 01/02/2017 0737       RADIOGRAPHIC STUDIES:  Ct Chest W Contrast  Result Date: 01/05/2019 CLINICAL DATA:  Restaging lung cancer. Status post right upper lobe lobectomy. EXAM: CT CHEST WITH CONTRAST TECHNIQUE: Multidetector CT imaging of the chest was performed during intravenous contrast administration. CONTRAST:  73mL OMNIPAQUE IOHEXOL  300 MG/ML  SOLN COMPARISON:  01/02/2018 FINDINGS: Cardiovascular: The heart is normal in size. No pericardial effusion. Stable advanced atherosclerotic calcifications involving the aorta and branch vessels, particularly the origin of the left subclavian artery. Stable extensive three-vessel coronary artery calcifications and coronary artery stents. Patient has had a TAVR since the last examination. No complicating features are identified. Mild stable pulmonary artery enlargement. Mediastinum/Nodes: No mediastinal or hilar mass or lymphadenopathy. Small scattered lymph nodes are stable. The esophagus is grossly normal. Lungs/Pleura: Stable emphysematous changes and pulmonary scarring and stable mild peripheral interstitial lung disease. Surgical changes are a right upper lobe lobectomy. No CT findings suspicious for recurrent tumor. No worrisome pulmonary nodules to suggest metastatic disease. No pleural effusions or pleural nodules. Upper Abdomen: No significant upper abdominal findings. No hepatic or adrenal gland lesions are identified. Stable advanced atherosclerotic calcifications involving the upper abdominal aorta and branch vessel ostia. Musculoskeletal: No breast masses, supraclavicular or axillary adenopathy. Stable small scattered lymph nodes. The thyroid gland is grossly normal. No significant bony findings.  No worrisome bone lesions. IMPRESSION: 1. Status post right upper lobe lobectomy. No findings suspicious for recurrent tumor or metastatic disease. 2. Status post TAVR.  No complicating features. 3. Stable severe age advanced atherosclerotic calcifications involving the aorta and coronary arteries. 4. Stable emphysematous changes, pulmonary scarring and interstitial lung disease. No acute overlying pulmonary findings. Aortic Atherosclerosis (ICD10-I70.0) and Emphysema (ICD10-J43.9). Electronically Signed   By: Marijo Sanes M.D.   On: 01/05/2019 11:43     ASSESSMENT/PLAN:  This is a very  pleasant 68 year old Caucasian female with a history of stage Ib non-small cell lung cancer, poorly differentiated squamous cell carcinoma.  She is status post right upper lobectomy with lymph node dissection.  She was diagnosed in October 2014.  The patient has been on observation for 6 years and she is feeling fine.  The patient recently had a restaging CT scan performed.  Dr. Julien Nordmann personally and independently reviewed the scan and discussed the results with the patient today.  The scan did not show any evidence of disease progression.  Dr. Julien Nordmann recommends that the patient continue on observation with a repeat CT scan of the chest in 1 year.  The patient was advised to call immediately if she has any concerning symptoms in the interval. The patient voices understanding of current disease status and treatment options and is in agreement with the current care plan. All questions were answered. The patient knows to call the clinic with any  problems, questions or concerns. We can certainly see the patient much sooner if necessary  Orders Placed This Encounter  Procedures  . CT Chest W Contrast    Standing Status:   Future    Standing Expiration Date:   01/07/2020    Order Specific Question:   ** REASON FOR EXAM (FREE TEXT)    Answer:   Restaging Lung Cancer    Order Specific Question:   If indicated for the ordered procedure, I authorize the administration of contrast media per Radiology protocol    Answer:   Yes    Order Specific Question:   Preferred imaging location?    Answer:   [facility]    Order Specific Question:   Radiology Contrast Protocol - do NOT remove file path    Answer:   [email or web address]  . CBC with Differential ([place] Only)    Standing Status:   Future    Standing Expiration Date:   01/07/2020  . CMP ([place] only)    Standing Status:   Future    Standing Expiration Date:   01/07/2020     Tobe Sos Heilingoetter,  PA-C 01/07/19  ADDENDUM: Hematology/Oncology Attending: I had a face-to-face encounter with the patient today.  I recommended her care plan.  This is a very pleasant 68 years old white female with a stage Ib non-small cell lung cancer, squamous cell carcinoma status post right upper lobectomy with mediastinal lymph node dissection under the care of Dr. Mohammed Kindle in December 2014.  The patient has been on observation since that time and she is doing fine. She had repeat CT scan of the chest performed recently.  I personally and independently reviewed the scans and discussed the results with the patient today. Her scan showed no concerning findings for disease recurrence or progression. I recommended for the patient to continue on observation with repeat CT scan of the chest in 1 year. She was advised to call immediately if she has any concerning symptoms in the interval.  Disclaimer: This note was dictated with voice recognition software. Similar sounding words can inadvertently be transcribed and may be missed upon review. Eilleen Kempf, MD 01/07/19

## 2019-01-07 NOTE — Patient Instructions (Signed)
It was a pleasure seeing you today!  MEDICATIONS: -We are changing your medications today -Increase Spironolactone to 25 mg (1 tab) daily.  -Call if you have questions about your medications.  NEXT APPOINTMENT: Return to clinic in 1 week for labs and 3 weeks for Pharmacy Clinic.  In general, to take care of your heart failure: -Limit your fluid intake to 2 Liters (half-gallon) per day.   -Limit your salt intake to ideally 2-3 grams (2000-3000 mg) per day. -Weigh yourself daily and record, and bring that "weight diary" to your next appointment.  (Weight gain of 2-3 pounds in 1 day typically means fluid weight.) -The medications for your heart are to help your heart and help you live longer.   -Please contact us before stopping any of your heart medications.  Call the clinic at [phone] with questions or to reschedule future appointments.

## 2019-01-08 ENCOUNTER — Telehealth: Payer: Self-pay | Admitting: Physician Assistant

## 2019-01-08 NOTE — Telephone Encounter (Signed)
Scheduled per los. Called and left msg. Mailed printout  °

## 2019-01-14 ENCOUNTER — Other Ambulatory Visit: Payer: Self-pay

## 2019-01-14 ENCOUNTER — Ambulatory Visit (HOSPITAL_COMMUNITY)
Admission: RE | Admit: 2019-01-14 | Discharge: 2019-01-14 | Disposition: A | Discharge status: Home / Self Care | Payer: PPO | Source: Ambulatory Visit | Attending: Cardiology | Admitting: Cardiology

## 2019-01-14 ENCOUNTER — Other Ambulatory Visit (HOSPITAL_COMMUNITY): Payer: Self-pay | Admitting: *Deleted

## 2019-01-14 DIAGNOSIS — I5022 Chronic systolic (congestive) heart failure: Secondary | ICD-10-CM

## 2019-01-14 LAB — BASIC METABOLIC PANEL
Anion gap: 11 (ref 5–15)
BUN: 24 mg/dL — ABNORMAL HIGH (ref 8–23)
CO2: 30 mmol/L (ref 22–32)
Calcium: 9.2 mg/dL (ref 8.9–10.3)
Chloride: 98 mmol/L (ref 98–111)
Creatinine, Ser: 0.75 mg/dL (ref 0.44–1.00)
GFR calc Af Amer: >60 mL/min (ref >60)
GFR calc non Af Amer: >60 mL/min (ref >60)
Glucose, Bld: 97 mg/dL (ref 70–99)
Potassium: 4.5 mmol/L (ref 3.5–5.1)
Sodium: 139 mmol/L (ref 135–145)

## 2019-01-15 NOTE — Progress Notes (Signed)
PCP: Primary Cardiologist: Dr Haroldine Laws   HPI:  Teresa [name] is a 68 year old with a history ofCAD (prior stenting of the LM, LAD and circumflex in the setting of dissection), prior RCA stenting, AAA, chronic bronchitis, chronic systolic and diastolic CHF, GERD, HLD, PAD, aortic stenosis and mitral regurgitation, ischemic cardiomyopathy. Lung cancer 2014requiring RUL, and tobacco abuse.  In June she saw Dr Melvyn Novas for increased dyspnea. Dyspnea was thought to be from severe AS. She was sent back to [facility] and had a ECHO. ECHO showedworsening EF and severe aortic stenosis + severe mitral regurgitation. She was set up for cath in preparation for TAVR. Presented for pre TAVR cath on 08/22/18 and was admitted due to volume overload. Diuresed with IV lasix and once optimized she underwent TAVR on 6/23.After TAVR she required additional diuresis and HF medication adjustments.    Echo6/2020 EF 25% , severe aortic stenosis, severe mitral regurgitation.   Echo 09/24/18 EF 30-35%. TAVR ok. Mild MR.  Recently presented to HF Clinic for follow-up with Dr. Haroldine Laws. Reported she felt "great" at that visit. She had been walking 1 hour per day. No CP or SOB. Edema resolved. She did report sometimes having pressure under diaphragm in the evening, but not with exertion. She reported taking furosemide 80 mg BID. Gets dizzy at times. She follows her home BP closely. SBP always around 95.   Today she returns to HF clinic for pharmacist medication titration. At last visit with MD, digoxin was discontinued and furosemide was decreased to 80 mg daily. Today she reports doing "wonderful" and she feels "like she is 53 again". She noted occasional dizziness, but it is not bothersome. She takes her BP consistently at home and her SBP are normally 95-105. No chest pain or palpitations. No SOB/DOE. She takes furosemide 80 mg daily and takes an extra 80 prn when she eats something salty (usually ~2x/week). No LEE, PND or  orthopnea. ReDS 32% in clinic today. Her weight has been increasing, but she attributes this to her improving appetite.    . Shortness of breath/dyspnea on exertion? no  . Orthopnea/PND? no . Edema? no . Lightheadedness/dizziness? occasional  . Daily weights at home? yes . Blood pressure/heart rate monitoring at home? Yes - SBP usually 95-105.  . Following low-sodium/fluid-restricted diet? yes  HF Medications: Bisoprolol 2.5 mg daily Losartan 25 mg nightly Spironolactone 12.5 mg daily Furosemide 80 mg daily  Has the patient been experiencing any side effects to the medications prescribed?  Noted headaches with spironolactone, but these are infrequent and do not last long.   Does the patient have any problems obtaining medications due to transportation or finances?   No - Medicare part D insurance.  Understanding of regimen: good Understanding of indications: good Potential of compliance: excellent Patient understands to avoid NSAIDs. Patient understands to avoid decongestants.    Pertinent Lab Values (01/05/19): [name] Serum creatinine 0.89, BUN 30, Potassium 4.5, Sodium 142, BNP 201.4 (12/09/18)  Vital Signs: . Weight: 105.8 lbs (last clinic weight: 107.4 lbs) . Blood pressure: 110/78  . Heart rate: 64   Assessment: 1. Chronic Systolic HF. ICM + valvular heart disease - previous ECHO with EF 40-45%. - 08/27/2018 ECHO EF 20-25% with severe MR - Echo 7/20  EF 30-35%. TAVR ok. Mild MR.  - NYHA I-II. Euvolemic on exam. ReDS 32%. - Labs on 01/05/2019 stable: Scr 0.89, K 3.9 - Continue furosemide 80 mg daily with an additional 80 mg PRN - Continue bisoprolol 2.5 mg daily. No  room to increase with HR consistently in low 60's.  - Continue losartan 25 mg every night. Unable to increase with low blood pressures. - Increase spironolactone to 25 mg daily. Repeat BMET in 1 week. We discussed following a low potassium diet. Previous elevated potassium labs occurred when she was taking a  potassium supplement or when she was admitted. Her last few potassium levels have been normal, so I am hopeful she will tolerate this increase. Will monitor closely.   - Digoxin discontinued 12/09/18 due to improvement in HF symptoms.   - Will consider initiating an SGLT2i at next visit. Would preferentially use Jardiance as Wilder Glade is not on her insurance plan's formulary. Jardiance copay is now $127.93 because she is within her Medicare coverage gap. She stated this would be affordable if she only had to pay that price for 1 month before insurance changes on January 1st. -Suspect she may have a burned out LV from advanced aortic stenosis now complicated by severe functional MR. She is s/p TAVR  - Will need repeat echo in 3 months (03/2019) to reassess EF. If not > 35% will need to discuss ICD.   2. Severe AS -S/P TAVR on 08/26/2018  -On asa + plavix. Can drop in 12/20  3. Severe MR - Functional MF. Improved with TAVR and treatment of HF.  4. CAD - No s/s of ischhemia Patent stents on LHC 6/19.  - Continue aspirin, plavix. -Continue Alirocumab (Praluent). Has failed atorvastatin and simvastatin (muscle aches/pains) and Zetia (severe stomach pain).  5. COPD - quit smoking 2014 - followed by Dr. Melvyn Novas  6. H/O Lung Cancer - s/p resection 2014   Plan: 1) Medication changes: Based on clinical presentation, vital signs and recent labs will increase spironolactone to 25 mg daily. Repeat BMET in 1 week. Follow low potassium diet.  2) Labs: stable on 01/05/2019, Scr 0.89, K 4.5 3) Follow-up: Repeat BMET in 1 week. Pharmacy Clinic in 3 weeks.    Audry Riles, PharmD, BCPS, CPP Heart Failure Clinic Pharmacist [phone]  Discussed patient with Dr. Lynelle Smoke.  Agree with assessment and plan.  Monitor K closely with increased spironolactone and agree with SGLT2.  Bonnita Nasuti Pharm.D. CPP, BCPS Clinical Pharmacist [phone] 01/15/2019 12:14 PM

## 2019-01-16 DIAGNOSIS — J9611 Chronic respiratory failure with hypoxia: Secondary | ICD-10-CM | POA: Diagnosis not present

## 2019-01-16 DIAGNOSIS — R05 Cough: Secondary | ICD-10-CM | POA: Diagnosis not present

## 2019-01-28 NOTE — Progress Notes (Signed)
Primary Cardiologist: Teresa [name]   HPI:  Teresa [name] is a 68 year old with a history ofCAD (prior stenting of the LM, LAD and circumflex in the setting of dissection), prior RCA stenting, AAA, chronic bronchitis, chronic systolic and diastolic CHF, GERD, HLD, PAD, aortic stenosis and mitral regurgitation, ischemic cardiomyopathy. Lung cancer 2014requiring RUL, and tobacco abuse.  In June she saw Teresa [name] for increased dyspnea. Dyspnea was thought to be from severe AS. She was sent back to [facility] and had a ECHO. ECHO showedworsening EF and severe aortic stenosis + severe mitral regurgitation. She was set up for cath in preparation for TAVR. Presented for pre TAVR cath on 08/22/18 and was admitted due to volume overload. Diuresed with IV lasix and once optimized she underwent TAVR on 6/23.After TAVR she required additional diuresis and HF medication adjustments.    Echo6/2020 EF 25% , severe aortic stenosis, severe mitral regurgitation.   Echo 09/24/18 EF 30-35%. TAVR ok. Mild MR.  Recently presented to HF clinic for pharmacist medication titration on 01/07/19. At that visit, she reported doing "wonderful" and felt "like she was 31 again". She noted occasional dizziness, but it was not bothersome. She takes her BP consistently at home and her SBP are normally 95-105. No chest pain or palpitations. No SOB/DOE. She takes furosemide 80 mg daily and takes an extra 80 prn when she eats something salty (usually ~2x/week). No LEE, PND or orthopnea. ReDS 32% in clinic.   Today she returns to HF clinic for pharmacist medication titration. At recent visits to HF Clinic, digoxin was discontinued, furosemide was decreased to 80 mg daily and spironolactone was increased to 25 mg daily. Overall she is feeling well today. No dizziness, lightheadedness, chest pain or palpitations. Her breathing is good. No SOB/DOE. She has been walking 1 hour every day. She takes furosemide 80 mg daily and takes an extra 80  prn when she eats something salty (usually ~2x/week). No LEE, PND or orthopnea. Her weight has been slowly increasing, but she attributed this to her improving appetite. Her BP is 120/76 in clinic today. Compared this to her home machine in clinic, which read 115/59. On her home BP log, her SBP have mostly run 110-120 for the last two weeks. Occasionally will dip down to SBP <100, but much less frequently than in previous months. This is encouraging since her Bps have previously limited HF medication titration.   . Shortness of breath/dyspnea on exertion? no  . Orthopnea/PND? no . Edema? no . Lightheadedness/dizziness? no . Daily weights at home? yes . Blood pressure/heart rate monitoring at home? Yes - SBP usually 95-105 -> now 110-120 . Following low-sodium/fluid-restricted diet? yes  HF Medications: Bisoprolol 2.5 mg daily Losartan 25 mg nightly Spironolactone 25 mg daily Furosemide 80 mg daily  Has the patient been experiencing any side effects to the medications prescribed?  Noted headaches with spironolactone, but these are infrequent and do not last long.   Does the patient have any problems obtaining medications due to transportation or finances?   No - Medicare part D insurance. However, Her Trelegy and Praluent are expensive when she is in the donut hole (~$300/month). I was able to get her a [place] to cover the copay of her Praluent. I encouraged her to speak with the providers office that prescribes the Trelegy to see if there were any patient assistance options.   Understanding of regimen: good Understanding of indications: good Potential of compliance: excellent Patient understands to avoid NSAIDs. Patient  understands to avoid decongestants.    Pertinent Lab Values (01/14/19): [name] Serum creatinine 0.75, BUN 24, Potassium 4.5, Sodium 139, BNP 201.4 (12/09/18)  Vital Signs: . Weight: 109 lbs (last clinic weight: 105.8 lbs) . Blood pressure: 120/70 . Heart rate: 54    Assessment: 1. Chronic Systolic HF. ICM + valvular heart disease - previous ECHO with EF 40-45%. - 08/27/2018 ECHO EF 20-25% with severe MR - Echo 7/20  EF 30-35%. TAVR ok. Mild MR.  - NYHA I-II. Euvolemic on exam.  - Labs on 01/14/2019 stable: Scr 0.75, K 4.5 -Vitals : BP 120/76, HR 54 - Continue furosemide 80 mg daily with an additional 80 mg PRN - Continue bisoprolol 2.5 mg daily. No room to increase with HR consistently in 50-60's.  - Increase losartan to 25 mg BID. Previously unable to increase with low blood pressures; however, her BP in clinic and on her home logs are much improved over the past 2-3 weeks (110-120s/60-70s). Will cautiously increase losartan to 25 mg BID and she will call if she has any S/Sx of hypotension, including dizziness. Repeat BMET in 2 weeks.  - Continue spironolactone 25 mg daily. - Digoxin discontinued 12/09/18 due to improvement in HF symptoms.   - Would consider initiating an SGLT2i at next visit. Would preferentially use Jardiance as Wilder Glade is not on her insurance plan's formulary. Jardiance copay is now $127.93 because she is within her Medicare coverage gap.  -Suspect she may have a burned out LV from advanced aortic stenosis now complicated by severe functional MR. She is s/p TAVR  - Will need repeat echo in 3 months (03/2019) to reassess EF. If not > 35% will need to discuss ICD.   2. Severe AS -S/P TAVR on 08/26/2018  -On asa + plavix. Can drop in 12/20  3. Severe MR - Functional MF. Improved with TAVR and treatment of HF.  4. CAD - No s/s of ischhemia Patent stents on [facility] 6/19.  - Continue aspirin, plavix. -Continue Alirocumab (Praluent). Has failed atorvastatin and simvastatin (muscle aches/pains) and Zetia (severe stomach pain). -Patient noted that her Praluent copay was ~$300.00 which is difficult to afford while also taking Trelegy.Obtained [employer] grant to assist with copay coverage for Praluent, bringing her copay to $0.00. This  grant is good through 01/04/2020 (see telephone note from 02/04/19)  5. COPD - quit smoking 2014 - followed by Teresa. Melvyn [name]  6. H/O Lung Cancer - s/p resection 2014   Plan: 1) Medication changes: Based on clinical presentation, vital signs and recent labs will increase losartan to 25 mg BID. She will call with any signs of dizziness/hypotension.  2) Labs: BMET in 2 weeks 3) Follow-up: Bmet in 2 weeks, HF clinic with Teresa. Haroldine [name] in 5 weeks.    Teresa [name], PharmD, BCPS, CPP Heart Failure Clinic Pharmacist [phone]

## 2019-02-04 ENCOUNTER — Telehealth (HOSPITAL_COMMUNITY): Payer: Self-pay | Admitting: Pharmacist

## 2019-02-04 ENCOUNTER — Other Ambulatory Visit: Payer: Self-pay

## 2019-02-04 ENCOUNTER — Ambulatory Visit (HOSPITAL_COMMUNITY)
Admission: RE | Admit: 2019-02-04 | Discharge: 2019-02-04 | Disposition: A | Discharge status: Home / Self Care | Payer: PPO | Source: Ambulatory Visit | Attending: Internal Medicine | Admitting: Internal Medicine

## 2019-02-04 VITALS — BP 120/76 | HR 54 | Wt 109.0 lb

## 2019-02-04 DIAGNOSIS — Z79899 Other long term (current) drug therapy: Secondary | ICD-10-CM | POA: Insufficient documentation

## 2019-02-04 DIAGNOSIS — Z955 Presence of coronary angioplasty implant and graft: Secondary | ICD-10-CM | POA: Diagnosis not present

## 2019-02-04 DIAGNOSIS — I350 Nonrheumatic aortic (valve) stenosis: Secondary | ICD-10-CM | POA: Insufficient documentation

## 2019-02-04 DIAGNOSIS — Z85038 Personal history of other malignant neoplasm of large intestine: Secondary | ICD-10-CM | POA: Insufficient documentation

## 2019-02-04 DIAGNOSIS — I255 Ischemic cardiomyopathy: Secondary | ICD-10-CM | POA: Insufficient documentation

## 2019-02-04 DIAGNOSIS — J449 Chronic obstructive pulmonary disease, unspecified: Secondary | ICD-10-CM | POA: Diagnosis not present

## 2019-02-04 DIAGNOSIS — Z87891 Personal history of nicotine dependence: Secondary | ICD-10-CM | POA: Insufficient documentation

## 2019-02-04 DIAGNOSIS — I5022 Chronic systolic (congestive) heart failure: Secondary | ICD-10-CM | POA: Diagnosis present

## 2019-02-04 DIAGNOSIS — I2510 Atherosclerotic heart disease of native coronary artery without angina pectoris: Secondary | ICD-10-CM | POA: Insufficient documentation

## 2019-02-04 DIAGNOSIS — I340 Nonrheumatic mitral (valve) insufficiency: Secondary | ICD-10-CM | POA: Diagnosis not present

## 2019-02-04 DIAGNOSIS — I739 Peripheral vascular disease, unspecified: Secondary | ICD-10-CM | POA: Diagnosis not present

## 2019-02-04 DIAGNOSIS — Z952 Presence of prosthetic heart valve: Secondary | ICD-10-CM | POA: Insufficient documentation

## 2019-02-04 DIAGNOSIS — I38 Endocarditis, valve unspecified: Secondary | ICD-10-CM | POA: Diagnosis not present

## 2019-02-04 DIAGNOSIS — I5042 Chronic combined systolic (congestive) and diastolic (congestive) heart failure: Secondary | ICD-10-CM | POA: Diagnosis not present

## 2019-02-04 MED ORDER — LOSARTAN POTASSIUM 25 MG PO TABS: 25.0000 mg | ORAL_TABLET | Freq: Two times a day (BID) | ORAL | 3 refills | Status: DC

## 2019-02-04 NOTE — Telephone Encounter (Signed)
Obtained [place] for copay assistance with Praluent.  ID: [id number] BIN: [id number] PCN: [id number] Group: [id number] Amount: $2,500.00 Dates: 01/04/19-01/04/20  Audry Riles, PharmD, BCPS, BCCP, CPP Heart Failure Clinic Pharmacist [phone]

## 2019-02-04 NOTE — Patient Instructions (Signed)
It was a pleasure seeing you today!  MEDICATIONS: -We are changing your medications today -Increase losartan to 25 mg (1 tablet) twice daily. Please call me if you have any dizziness or feel unwell. My number is [phone]. -Call if you have questions about your medications.  NEXT APPOINTMENT: Return to clinic in 2 weeks for lab draw and 5 weeks with Dr. Haroldine Laws.  In general, to take care of your heart failure: -Limit your fluid intake to 2 Liters (half-gallon) per day.   -Limit your salt intake to ideally 2-3 grams (2000-3000 mg) per day. -Weigh yourself daily and record, and bring that "weight diary" to your next appointment.  (Weight gain of 2-3 pounds in 1 day typically means fluid weight.) -The medications for your heart are to help your heart and help you live longer.   -Please contact us before stopping any of your heart medications.  Call the clinic at [phone] with questions or to reschedule future appointments.

## 2019-02-15 DIAGNOSIS — R05 Cough: Secondary | ICD-10-CM | POA: Diagnosis not present

## 2019-02-15 DIAGNOSIS — J9611 Chronic respiratory failure with hypoxia: Secondary | ICD-10-CM | POA: Diagnosis not present

## 2019-02-16 ENCOUNTER — Other Ambulatory Visit: Payer: Self-pay

## 2019-02-16 ENCOUNTER — Other Ambulatory Visit (HOSPITAL_COMMUNITY): Payer: Self-pay

## 2019-02-16 ENCOUNTER — Ambulatory Visit (HOSPITAL_COMMUNITY)
Admission: RE | Admit: 2019-02-16 | Discharge: 2019-02-16 | Disposition: A | Discharge status: Home / Self Care | Payer: PPO | Source: Ambulatory Visit | Attending: Cardiology | Admitting: Cardiology

## 2019-02-16 DIAGNOSIS — I5022 Chronic systolic (congestive) heart failure: Secondary | ICD-10-CM

## 2019-02-16 LAB — BASIC METABOLIC PANEL
Anion gap: 9 (ref 5–15)
BUN: 25 mg/dL — ABNORMAL HIGH (ref 8–23)
CO2: 28 mmol/L (ref 22–32)
Calcium: 9.2 mg/dL (ref 8.9–10.3)
Chloride: 102 mmol/L (ref 98–111)
Creatinine, Ser: 0.89 mg/dL (ref 0.44–1.00)
GFR calc Af Amer: >60 mL/min (ref >60)
GFR calc non Af Amer: >60 mL/min (ref >60)
Glucose, Bld: 91 mg/dL (ref 70–99)
Potassium: 4.9 mmol/L (ref 3.5–5.1)
Sodium: 139 mmol/L (ref 135–145)

## 2019-02-17 ENCOUNTER — Telehealth (HOSPITAL_COMMUNITY): Payer: Self-pay | Admitting: Pharmacist

## 2019-02-17 MED ORDER — SPIRONOLACTONE 25 MG PO TABS: 25.0000 mg | ORAL_TABLET | Freq: Every day | ORAL | 3 refills | Status: DC

## 2019-02-17 NOTE — Telephone Encounter (Signed)
Sent in 90 day supply of spironolactone per patient request.   Audry Riles, PharmD, BCPS, BCCP, CPP Heart Failure Clinic Pharmacist [phone]

## 2019-03-05 DIAGNOSIS — Z20828 Contact with and (suspected) exposure to other viral communicable diseases: Secondary | ICD-10-CM | POA: Diagnosis not present

## 2019-03-11 ENCOUNTER — Encounter (HOSPITAL_COMMUNITY): Payer: PPO | Admitting: Internal Medicine

## 2019-03-11 ENCOUNTER — Ambulatory Visit (HOSPITAL_COMMUNITY): Payer: PPO

## 2019-03-16 ENCOUNTER — Telehealth: Payer: Self-pay | Admitting: Physician Assistant

## 2019-03-16 NOTE — Telephone Encounter (Signed)
New Message  Patient called and informed me that she has tested positive for covid at the end of December, and she wants to know if it is ok to take the 2nd part of her shingles shot as well as the covid vaccine when it comes up at the end of the month.   Please call to discuss

## 2019-03-16 NOTE — Telephone Encounter (Signed)
Encouraged the patient to speak with her PCP regarding her shingles vaccination. Pt is not eligible to receive the COVID vaccine at this time due to her age. She verbalized understanding and thanked me for the call.

## 2019-03-18 DIAGNOSIS — R05 Cough: Secondary | ICD-10-CM | POA: Diagnosis not present

## 2019-03-18 DIAGNOSIS — J9611 Chronic respiratory failure with hypoxia: Secondary | ICD-10-CM | POA: Diagnosis not present

## 2019-03-25 ENCOUNTER — Ambulatory Visit (HOSPITAL_COMMUNITY)
Admission: RE | Admit: 2019-03-25 | Discharge: 2019-03-25 | Disposition: A | Discharge status: Home / Self Care | Payer: PPO | Source: Ambulatory Visit | Attending: Internal Medicine | Admitting: Internal Medicine

## 2019-03-25 ENCOUNTER — Telehealth (HOSPITAL_COMMUNITY): Payer: Self-pay | Admitting: *Deleted

## 2019-03-25 ENCOUNTER — Encounter (HOSPITAL_COMMUNITY): Payer: Self-pay | Admitting: Internal Medicine

## 2019-03-25 ENCOUNTER — Ambulatory Visit (HOSPITAL_BASED_OUTPATIENT_CLINIC_OR_DEPARTMENT_OTHER)
Admission: RE | Admit: 2019-03-25 | Discharge: 2019-03-25 | Disposition: A | Discharge status: Home / Self Care | Payer: PPO | Source: Ambulatory Visit | Attending: Internal Medicine | Admitting: Internal Medicine

## 2019-03-25 ENCOUNTER — Other Ambulatory Visit: Payer: Self-pay

## 2019-03-25 VITALS — BP 126/50 | HR 73 | Wt 113.6 lb

## 2019-03-25 DIAGNOSIS — K219 Gastro-esophageal reflux disease without esophagitis: Secondary | ICD-10-CM | POA: Diagnosis not present

## 2019-03-25 DIAGNOSIS — I5043 Acute on chronic combined systolic (congestive) and diastolic (congestive) heart failure: Secondary | ICD-10-CM | POA: Diagnosis not present

## 2019-03-25 DIAGNOSIS — Z7951 Long term (current) use of inhaled steroids: Secondary | ICD-10-CM | POA: Diagnosis not present

## 2019-03-25 DIAGNOSIS — I5022 Chronic systolic (congestive) heart failure: Secondary | ICD-10-CM

## 2019-03-25 DIAGNOSIS — Z952 Presence of prosthetic heart valve: Secondary | ICD-10-CM

## 2019-03-25 DIAGNOSIS — Z85118 Personal history of other malignant neoplasm of bronchus and lung: Secondary | ICD-10-CM | POA: Diagnosis not present

## 2019-03-25 DIAGNOSIS — J449 Chronic obstructive pulmonary disease, unspecified: Secondary | ICD-10-CM | POA: Diagnosis not present

## 2019-03-25 DIAGNOSIS — I739 Peripheral vascular disease, unspecified: Secondary | ICD-10-CM | POA: Diagnosis not present

## 2019-03-25 DIAGNOSIS — E7800 Pure hypercholesterolemia, unspecified: Secondary | ICD-10-CM | POA: Insufficient documentation

## 2019-03-25 DIAGNOSIS — I5032 Chronic diastolic (congestive) heart failure: Secondary | ICD-10-CM

## 2019-03-25 DIAGNOSIS — I080 Rheumatic disorders of both mitral and aortic valves: Secondary | ICD-10-CM | POA: Diagnosis not present

## 2019-03-25 DIAGNOSIS — I255 Ischemic cardiomyopathy: Secondary | ICD-10-CM | POA: Diagnosis not present

## 2019-03-25 DIAGNOSIS — Z87891 Personal history of nicotine dependence: Secondary | ICD-10-CM | POA: Insufficient documentation

## 2019-03-25 DIAGNOSIS — Z7902 Long term (current) use of antithrombotics/antiplatelets: Secondary | ICD-10-CM | POA: Diagnosis not present

## 2019-03-25 DIAGNOSIS — Z79899 Other long term (current) drug therapy: Secondary | ICD-10-CM | POA: Insufficient documentation

## 2019-03-25 DIAGNOSIS — I2510 Atherosclerotic heart disease of native coronary artery without angina pectoris: Secondary | ICD-10-CM | POA: Diagnosis not present

## 2019-03-25 DIAGNOSIS — Z953 Presence of xenogenic heart valve: Secondary | ICD-10-CM | POA: Diagnosis not present

## 2019-03-25 DIAGNOSIS — Z955 Presence of coronary angioplasty implant and graft: Secondary | ICD-10-CM | POA: Insufficient documentation

## 2019-03-25 DIAGNOSIS — M1990 Unspecified osteoarthritis, unspecified site: Secondary | ICD-10-CM | POA: Diagnosis not present

## 2019-03-25 DIAGNOSIS — E785 Hyperlipidemia, unspecified: Secondary | ICD-10-CM | POA: Insufficient documentation

## 2019-03-25 DIAGNOSIS — Z836 Family history of other diseases of the respiratory system: Secondary | ICD-10-CM | POA: Diagnosis not present

## 2019-03-25 DIAGNOSIS — I5042 Chronic combined systolic (congestive) and diastolic (congestive) heart failure: Secondary | ICD-10-CM | POA: Insufficient documentation

## 2019-03-25 DIAGNOSIS — I350 Nonrheumatic aortic (valve) stenosis: Secondary | ICD-10-CM

## 2019-03-25 LAB — BASIC METABOLIC PANEL
Anion gap: 11 (ref 5–15)
BUN: 29 mg/dL — ABNORMAL HIGH (ref 8–23)
CO2: 25 mmol/L (ref 22–32)
Calcium: 9.2 mg/dL (ref 8.9–10.3)
Chloride: 101 mmol/L (ref 98–111)
Creatinine, Ser: 1.11 mg/dL — ABNORMAL HIGH (ref 0.44–1.00)
GFR calc Af Amer: 60 mL/min — ABNORMAL LOW (ref >60)
GFR calc non Af Amer: 52 mL/min — ABNORMAL LOW (ref >60)
Glucose, Bld: 102 mg/dL — ABNORMAL HIGH (ref 70–99)
Potassium: 4.7 mmol/L (ref 3.5–5.1)
Sodium: 137 mmol/L (ref 135–145)

## 2019-03-25 MED ORDER — FUROSEMIDE 40 MG PO TABS: 40.0000 mg | ORAL_TABLET | ORAL | Status: DC | PRN

## 2019-03-25 NOTE — Patient Instructions (Signed)
Take Lasix 40mg  as needed for fluid or edema.  Routine lab work today. Will notify you of abnormal results  Follow up in 6 months.

## 2019-03-25 NOTE — Progress Notes (Signed)
  Echocardiogram 2D Echocardiogram has been performed.  Jennette Dubin 03/25/2019, 11:51 AM

## 2019-03-25 NOTE — Progress Notes (Addendum)
PCP: Primary Cardiologist: Dr Haroldine Laws   HPI: Teresa [name] is a 69 year old with a history of CAD (prior stenting of the LM, LAD and circumflex in the setting of dissection), prior RCA stenting, AAA, chronic bronchitis, chronic systolic and diastolic CHF, GERD, HLD, PAD, aortic stenosis and mitral regurgitation, ischemic cardiomyopathy. Lung cancer 2014 requiring RUL,  and tobacco abuse.   In June she saw Dr Melvyn Novas for increased dyspnea. Dyspne was thought to be from severe AS. She was sent back to [facility] and had a ECHO. ECHO showed worsening EF and severe aortic stenosis + severe mitral regurgitation. She was set up for cath in preparation for TAVR.Presented for pre TAVR cath on 08/22/18 and was admitted due to volume overload. Diuresed with IV lasix and once optimized she underwent TAVR on 6/23. After TAVR she required additional diuresis and HF medication adjustments.   Says she was diagnosed with Covid in December 2020  Today she returns for HF follow up. Last visit lasix was cut back to 80 mg daily and losartan was increased to 25 mg twice a day. Overall feeling fine. Mild SOB with brisk walking and vacuuming.  Denies/PND/Orthopnea. Appetite ok. No fever or chills. Weight at home 108-110  pounds. Taking all medications.   Echo 09/24/18 EF 30-35%. TAVR ok. Mild MR.   Echo 08/2018 EF 25% , severe aortic stenosis, severe mitral regurgitation.   LHC 08/19/2018 1. Triple vessel CAD.  2. Patent stents in the left main, LAD and Circumflex (placed at the time of dissection) 3. Chronic occlusion small Obtuse marginal branch which fills from right to left collaterals 4. Patent mid to distal RCA stent. The proximal RCA has a moderate non-obstructive stenosis which does not appear to be flow limiting. This is unchanged from her last cath.  5. Severe aortic stenosis (mean gradient 21 mmHg, peak to peak gradient 36 mmHg, AVA 0.71 cm2) 6. Elevated filling pressures c/w acute volume overload (RA 9/14,    ROS: All systems negative except as listed in HPI, PMH and Problem List.  SH:  Social History   Socioeconomic History  . Marital status: Married    Spouse name: Not on file  . Number of children: 1  . Years of education: 23  . Highest education level: High school graduate  Occupational History  . 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 Smoking status: Former Smoker    Packs/day: 0.50    Years: 40.00    Pack years: 20.00    Types: Cigarettes    Quit date: 10/08/2012    Years since quitting: 6.4  . Smokeless tobacco: Never Used  Substance and Sexual Activity  . Alcohol use: No  . Drug use: No  . Sexual activity: Yes  Other Topics Concern  . Not on file  Social History Narrative  . Not on file   Social Determinants of Health   Financial Resource Strain: Low Risk   . Difficulty of Paying Living Expenses: Not hard at all  Food Insecurity: No Food Insecurity  . Worried About Charity fundraiser in the Last Year: Never true  . Ran Out of Food in the Last Year: Never true  Transportation Needs: No Transportation Needs  . Lack of Transportation (Medical): No  . Lack of Transportation (Non-Medical): No  Physical Activity: Insufficiently Active  . Days of Exercise per Week: 4 days  . Minutes of Exercise per Session: 30 min  Stress: No Stress Concern Present  . Feeling of Stress :  Only a little  Social Connections:   . Frequency of Communication with Friends and Family: Not on file  . Frequency of Social Gatherings with Friends and Family: Not on file  . Attends Religious Services: Not on file  . Active Member of Clubs or Organizations: Not on file  . Attends Archivist Meetings: Not on file  . Marital Status: Not on file  Intimate Partner Violence:   . Fear of Current or Ex-Partner: Not on file  . Emotionally Abused: Not on file  . Physically Abused: Not on file  . Sexually Abused: Not on file    FH:  Family History  Problem Relation Age of Onset   . Alcohol abuse Father   . Lupus Mother   . Other Sister        Degenerative disc disease  . Migraines Sister   . Pulmonary fibrosis Brother   . Heart attack Neg Hx     Past Medical History:  Diagnosis Date  . AAA (abdominal aortic aneurysm) ([place])    a. small by cath 2014.  [name] Acute on chronic combined systolic and diastolic heart failure ([place]) 08/19/2018  . Arthritis    "across my hips; buttocks; comes w/the weather" (02/27/2013)  . CAD (coronary artery disease), native coronary artery    a. 10/2012 - rotational atherectomy of LAD c/b dissection s/p PCI to LAD, LCx, LM. b. 12/2012 - s/p overlapping DES to RCA.  . Cataract    "just the beginnings on the right" (02/27/2013)  . Chronic combined systolic and diastolic CHF (congestive heart failure) ([place])   . COPD (chronic obstructive pulmonary disease) ([place])   . Coronary artery disease   . GERD (gastroesophageal reflux disease)   . Hypercholesterolemia   . Ischemic cardiomyopathy    a. EF 45-50%.  . Mass of lung    "small tumor RUL; they are watching it" (02/27/2013)  . Mitral regurgitation   . Peripheral vascular disease, unspecified ([place])   . S/P TAVR (transcatheter aortic valve replacement)    23 mm Edwards Sapien 3 transcatheter heart valve placed via open left transaxillary approach   . Severe aortic stenosis   . Severe mitral regurgitation   . Tachycardia, unspecified   . Tobacco abuse     Current Outpatient Medications  Medication Sig Dispense Refill  . albuterol (PROAIR HFA) 108 (90 Base) MCG/ACT inhaler Inhale 2 puffs into the lungs every 6 (six) hours as needed for wheezing or shortness of breath.    [name] albuterol (PROVENTIL) (2.5 MG/3ML) 0.083% nebulizer solution Take 3 mLs (2.5 mg total) by nebulization every 4 (four) hours as needed for wheezing or shortness of breath. 75 mL 12  . Alirocumab 150 MG/ML SOAJ Inject into the skin. Inject 1 pen into the skin every 14 days(tuesdays)    . bisoprolol (ZEBETA) 5 MG tablet  Take 0.5 tablets (2.5 mg total) by mouth daily. 15 tablet 6  . clopidogrel (PLAVIX) 75 MG tablet TAKE 1 TABLET(75 MG) BY MOUTH DAILY 90 tablet 3  . Fluticasone-Umeclidin-Vilant (TRELEGY ELLIPTA) 100-62.5-25 MCG/INH AEPB Inhale 1 puff into the lungs daily.    . furosemide (LASIX) 80 MG tablet Take 1 tablet (80 mg total) by mouth daily. 180 tablet 2  . Hypromellose (GENTEAL MILD) 0.2 % SOLN Place 2 drops into both eyes 4 (four) times daily as needed (dry eyes).     . lansoprazole (PREVACID) 30 MG capsule Take 30 mg by mouth daily before lunch.    . losartan (COZAAR) 25  MG tablet Take 1 tablet (25 mg total) by mouth 2 (two) times daily. 180 tablet 3  . NASAL SALINE NA Place 1 spray into the nose daily as needed (congestion).     . nitroGLYCERIN (NITROSTAT) 0.4 MG SL tablet Place 1 tablet (0.4 mg total) under the tongue every 5 (five) minutes as needed for chest pain. 25 tablet 4  . OXYGEN 2lpm with sleep    . simethicone (MYLICON) 80 MG chewable tablet Chew 2 tablets (160 mg total) by mouth every 6 (six) hours as needed for flatulence. 30 tablet 0  . Spacer/Aero-Holding Chambers (AEROCHAMBER MV) inhaler Use as instructed 1 each 0  . Specialty Vitamins Products ([employer] MENOPAUSE SUPPORT PO) Take 1 capsule by mouth daily.    [name] spironolactone (ALDACTONE) 25 MG tablet Take 1 tablet (25 mg total) by mouth daily. 90 tablet 3   No current facility-administered medications for this encounter.    Vitals:   03/25/19 1148  BP: (!) 126/50  Pulse: 73  SpO2: 100%  Weight: 51.5 kg (113 lb 9.6 oz)   Wt Readings from Last 3 Encounters:  03/25/19 51.5 kg (113 lb 9.6 oz)  02/04/19 49.4 kg (109 lb)  01/07/19 48 kg (105 lb 12.8 oz)    PHYSICAL EXAM: General:  Well appearing. No resp difficulty HEENT: normal Neck: supple. no JVD. Carotids 2+ bilat; no bruits. No lymphadenopathy or thryomegaly appreciated. Cor: PMI nondisplaced. Regular rate & rhythm. No rubs, gallops or murmurs. Lungs: clear with mildly  decreased BS Abdomen: soft, nontender, nondistended. No hepatosplenomegaly. No bruits or masses. Good bowel sounds. Extremities: no cyanosis, clubbing, rash, edema Neuro: alert & orientedx3, cranial nerves grossly intact. moves all 4 extremities w/o difficulty. Affect pleasant    ASSESSMENT & PLAN: 1. Chronic Systolic HF. ICM + valvular heart disease - previous ECHO with EF 40-45%.  - 08/27/2018 ECHO EF 20-25% with severe MR - Echo 7/20  EF 30-35%. TAVR ok. Mild MR.  -Echo 03/25/19 EF 50% Apex hypokinetic - NYHA II. Volume low. Change lasix to 40 mg as needed.  - Continue spiro 25 mg daily  -  Continue 2.5 mg bisoprolol. No room to increase with pulse 50.  - Continue losartan 25 mg twice a day.  - Suspect she may have a burned out LV from advanced aortic stenosis now complicated by severe functional MR. She is s/p TAVR  -Check BMET today   2. Severe AS -S/P TAVR on 08/26/2018  -Continue  plavix. Off aspirin in 12/23  3. Severe MR - Functional.Improved with TAVR and treatment of HF  4. CAD - No s/s of ischhemia Patent stents on [facility] 6/19.  - consider DAPT and statin  5. COPD - quit smoking 2014 - followed by Dr, Melvyn Novas  6. H/O Lung Cancer - s/p resection 2014  Check BMET.  Follow up in 6 months   Teresa Grinder, NP  12:05 PM   Agree with above.   She is doing very well. Recently had COVID but recovered well. Has been getting orthostatic recently. Denies CP or SOB.   Echo reviewed today EF 50% apical AK. Cannot exclude small apical thrombus.   General:  Well appearing. No resp difficulty HEENT: normal Neck: supple. no JVD. Carotids 2+ bilat; no bruits. No lymphadenopathy or thryomegaly appreciated. Cor: PMI nondisplaced. Regular rate & rhythm. 2/6 SEM at RUSB s2 crisp Lungs: clear with decreased BS Abdomen: soft, nontender, nondistended. No hepatosplenomegaly. No bruits or masses. Good bowel sounds. Extremities: no cyanosis, clubbing, rash,  edema Neuro: alert &  orientedx3, cranial nerves grossly intact. moves all 4 extremities w/o difficulty. Affect pleasant   Doing very well. Likely over diuresed in setting of near normalized LV function. Change lasix to 40mg  prn only. Will need Definity echo to look more closely for LV apical clot.   Teresa Bickers, MD  1:43 PM

## 2019-03-25 NOTE — Telephone Encounter (Signed)
Per Dr Haroldine Laws, pt had echo today and it shows what could be a clot on the apex however it is hard to see, he would like pt to come back in for limited echo w/definity.  Appt sch for tomorrow at 2 pm.  Pt is aware and agreeable.

## 2019-03-26 ENCOUNTER — Ambulatory Visit (HOSPITAL_COMMUNITY)
Admission: RE | Admit: 2019-03-26 | Discharge: 2019-03-26 | Disposition: A | Discharge status: Home / Self Care | Payer: PPO | Source: Ambulatory Visit | Attending: Internal Medicine | Admitting: Internal Medicine

## 2019-03-26 DIAGNOSIS — I5022 Chronic systolic (congestive) heart failure: Secondary | ICD-10-CM | POA: Insufficient documentation

## 2019-03-26 MED ORDER — PERFLUTREN LIPID MICROSPHERE
1.0000 mL | INTRAVENOUS | Status: AC | PRN
  Administered 2019-03-26: 2 mL via INTRAVENOUS
  Filled 2019-03-26: qty 10

## 2019-03-26 NOTE — Progress Notes (Signed)
  Echocardiogram 2D Echocardiogram has been performed.  Artavius Stearns A Americo Vallery 03/26/2019, 2:49 PM

## 2019-04-10 ENCOUNTER — Ambulatory Visit: Payer: PPO | Admitting: Internal Medicine

## 2019-04-10 ENCOUNTER — Other Ambulatory Visit: Payer: Self-pay

## 2019-04-10 ENCOUNTER — Encounter: Payer: Self-pay | Admitting: Internal Medicine

## 2019-04-10 VITALS — BP 126/64 | HR 65 | Temp 97.1°F | Ht 59.0 in | Wt 118.0 lb

## 2019-04-10 DIAGNOSIS — J449 Chronic obstructive pulmonary disease, unspecified: Secondary | ICD-10-CM | POA: Diagnosis not present

## 2019-04-10 DIAGNOSIS — J9611 Chronic respiratory failure with hypoxia: Secondary | ICD-10-CM | POA: Diagnosis not present

## 2019-04-10 NOTE — Patient Instructions (Addendum)
Ok to stop 02 and we will request an overnight study on room air and advise you   Blow trelegy out thru nose    If you are satisfied with your treatment plan,  let your doctor know and he/she can either refill your medications or you can return here when your prescription runs out.     If in any way you are not 100% satisfied,  please tell us.  If 100% better, tell your friends!  Pulmonary follow up is as needed

## 2019-04-10 NOTE — Progress Notes (Signed)
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  In meantime repeat CT Chest c/w evolving increase in density RUL lesion so referred to pulmonary. Has dx of mod to severe AS with most recent echo done 2 year prior to [place]  But yearly eval due w/in a week.  No ex cp or presyncope, denies any limiting sob with desired activities. rec You have a small nodule in right upper lobe that could be a atypical infection or an early tumor     DIAGNOSIS: Stage IB (T2a., N0, M0) poorly differentiated squamous cell carcinoma, diagnosed in December of 2014.  PRIOR THERAPY: Flexible video bronchoscopy, right upper lobectomy with mediastinal lymph node dissection under the care of Dr. Cyndia Bent.   CURRENT THERAPY: Observation as of ov 01/10/16 s any adjuvant rx    01/13/2016   Consultation Millan Legan re:  Abn ct/ cough x 3 days on gerd rx tiw and cough drops  Chief Complaint  Patient presents with  . Pulmonary Consult    Referred by Dr. Earlie Server for eval of recent chest ct. She c/o cough for the past few days- prod with clear sputum.    Not limited by breathing from desired activities  / chasing toddlers around all  Day s need for albuterol except in the spring and the fall   Imp gold II copd/ uacs  rec When coughing > take prevacid 30 mg Take 30-60 min before first meal of the day and pepcid ac 20 mg at bedtime until you stop coughing GERD rx     12/31/2017 acute extended ov/Stehanie Ekstrom re:  Cough / sp ER eval 12/16/17 for  bronchospasm rx prednisone and improved some Chief Complaint  Patient presents with  . Acute Visit    Pt states having "bronchitis and bronchial spasms" since September 2019.  She is "hacking"- non prod cough.   was using proair in aug 2019 3-4 x per week for cough/ wheeze stopped working as well  Sept 2019  and rx pred/advair 12/26/17 helped breathing  a lot but throat started feeling "raw" and dry day > noct cough/ urge to clear throat which is new since started advair 250 dpi Sleep flat bed/ large pillow on side Walked at mall since starting advair nl pace  Not needing saba now  rec Plan A = Automatic = change advair to stiolto 2 pffs each am  Work on inhaler technique:   Plan B = Backup Only use your albuterol as a rescue medication    01/09/18 NP eval rec >>>BNP  =  2,266  Could consider high-res CT in the future Stiolto Respimat inhaler >>>2 puffs daily Only use your albuterol as a rescue medication    01/16/18  Cards eval/ [place] 1. CAD: Feeling much better after diuretics were increased.  Continue aggressive secondary prevention.  If she does develop any chest tightness or concern for angina, we will plan for  cardiac cath.  She will let us know if symptoms change.  LDL 76 in 8/19. 2. Aortic stenosis: No symptoms of severe aortic stenosis.  Preserved S2 on exam. 3. Combined systolic and diastolic heart failure: Much improved with diuretics.  Will decrease Lasix back to 40 mg daily.  I have advised her to take a second dose of Lasix 2 days a week.  She would also need an additional dose of Lasix if she gains more than 3 pounds or knows she is eating something excessively salty.  She has been very compliant with our instructions to reduce salt in her diet.  I suspect this is also help with her fluid retention.  Recheck BNP and BMet today. 4. HTN: The current medical regimen is effective;  continue present plan and medications.    06/23/2018 Telemedicine ov / stiolto no longer  covered  rec Duoneb (ipratropium and albuterol) four times a day for now  Prednisone 10 mg tablets take 2 if doing poorly and 1 if doing well with breathing daily     07/07/2018  f/u ov/Sereniti Wan re: copd III/ better on pred now at 10 mg daily  Chief Complaint  Patient presents with  . Follow-up    Breathing is doing overall doing well. She has not had to use proair, but uses neb 3 x per day.   Dyspnea:  Garbage to street / baseline down street and back s stopping / slow pace = MMRC2 = can't walk a nl pace on a flat grade s sob but does fine slow and flat  Cough: min p neb Sleeping: 30 degrees  SABA use: no proair since started duoneb  02: 2lpm bedtime/ sats ok walking s 02  rec Plan A = Automatic = Trelegy one daily  - one click and take two separate deep drags  Plan B = Backup Only use your albuterol inhaler as a rescue medication  Plan C = Crisis - only use your albuterol nebulizer if you first try Plan B and it fails to help  Predisone ceiling is 20 mg daily and the floor is 5 mg daily  Please schedule a follow up office visit in 4 weeks, sooner if needed  with all medications /inhalers/ solutions in hand so we can verify exactly what you are taking. This includes all medications from all doctors and over the counters    08/04/2018  f/u ov/Dreshaun Stene re:  COPD III/ prednisone 10 mg one daily  And trelegy and freq saba  Chief Complaint  Patient presents with  . Follow-up    Breathing has been worse x 2 wks. She c/o chest tightness. She has been using her rescue inhaler more often.    Dyspnea:  10 min walking s stopping with sats in 90s s 02 now  Cough: none Sleeping: 30 degrees SABA use: qid saba hfa typically with activity  no neb 02: prn not using hs  rec No change in medications for now Please schedule a follow up office visit in 4 weeks, sooner if needed  Late add: Recommended in extra dose of Lasix daily,  echocardiogram as soon as possible with cardiology to follow as  well.   Admit date: 08/19/2018 Discharge date: 08/28/2018  Admission Diagnoses: Acute exacerbation of class IV chronic combined systolic and diastolic heart failure Chronic obstructive pulmonary disease Coronary artery disease post emergent PTCI to left main, LAD, and circumflex in the setting of acute coronary dissection Dyslipidemia Ischemic cardiomyopathy Peripheral arterial disease Gastroesophageal reflux disease Severe  aortic stenosis Severe mitral insufficiency History of lung cancer  Discharge Diagnoses:  Principal Problem:   S/P TAVR (transcatheter aortic valve replacement)   COPD GOLD III    Former Smoker   CAD (coronary artery disease), native coronary artery   Mixed hyperlipidemia   Coronary atherosclerosis of native coronary artery   Lung cancer, upper lobe (HCC)   Obesity   Chronic respiratory failure with hypoxia (HCC)   Severe aortic stenosis   Mitral regurgitation   Acute on chronic combined systolic and diastolic heart failure ([place])    10/08/2018  f/u ov/Tacara Hadlock re: GOLD III copd/ marked improvement since TAVR Chief Complaint  Patient presents with  . Follow-up    Breathing is back to her normal baseline.   Dyspnea:  Walks an hour a day flat and slow = MMRC2 = can't walk a nl pace on a flat grade s sob but does fine slow and flat and no desats on RA Cough: tickle in throat "little hack" on trelegy  Sleeping: ok 30 degrees SABA use: none 02: 2lpm hs only  rec Plan A = Automatic = bevespi Take 2 puffs first thing in am and then another 2 puffs about 12 hours later.  If any problems just change to Trelegy  Work on inhaler technique:    Plan B = Backup Only use your albuterol inhaler as a rescue medication  Plan C = Crisis - only use your albuterol nebulizer if you first try Plan B and it fails to help > ok to use the nebulizer up to every 4 hours but if start needing it regularly call for immediate appointment   04/10/2019  f/u ov/Elayjah Chaney re:  GOLD III  -  all smiles s/p COVID 19 s resp symptoms  Chief Complaint  Patient presents with  . Follow-up    Pt states her breathing is still doing well. She was dxed with covid 19 03/05/2019. She has not used her albuterol inhaler or neb.   onset of symptom xmas 2020 mainly aches/chills no sob or cough > stayed home p dx and all symptoms gradually resolved back to baseline and now wants off 02  Dyspnea:  MMRC1 = can walk nl pace, flat grade, can't hurry or go uphills or steps s sob   Cough: non Sleeping: sleep number 30 degrees  SABA use: no  02: 2lhs and and mid 90s walking Some watery rhinitis    No obvious day to day or daytime variability or assoc excess/ purulent sputum or mucus plugs or hemoptysis or cp or chest tightness, subjective wheeze or overt  r hb symptoms.   Sleeping as above without nocturnal  or early am exacerbation  of respiratory  c/o's or need for noct saba. Also denies any obvious fluctuation of symptoms with weather or environmental changes or other aggravating or alleviating factors except as outlined above   No unusual exposure hx or h/o childhood pna/ asthma or knowledge of premature birth.  Current Allergies, Complete Past Medical History, Past Surgical History, Family History, and Social History were reviewed in [employer] record.  ROS  The following are not active complaints unless bolded Hoarseness, sore throat, dysphagia, dental problems, itching, sneezing,  nasal congestion or discharge of excess mucus or purulent secretions, ear ache,   fever, chills, sweats, unintended wt loss or wt gain, classically pleuritic or exertional cp,  orthopnea pnd or arm/hand swelling  or leg swelling, presyncope, palpitations, abdominal pain, anorexia, nausea, vomiting, diarrhea  or change in  bowel habits or change in bladder habits, change in stools or change in urine, dysuria, hematuria,  rash, arthralgias, visual complaints, headache, numbness, weakness or ataxia or  problems with walking or coordination,  change in mood or  memory.        Current Meds  Medication Sig  . albuterol (PROAIR HFA) 108 (90 Base) MCG/ACT inhaler Inhale 2 puffs into the lungs every 6 (six) hours as needed for wheezing or shortness of breath.  [name] albuterol (PROVENTIL) (2.5 MG/3ML) 0.083% nebulizer solution Take 3 mLs (2.5 mg total) by nebulization every 4 (four) hours as needed for wheezing or shortness of breath.  . Alirocumab 150 MG/ML SOAJ Inject into the skin. Inject 1 pen into the skin every 14 days(tuesdays)  . bisoprolol (ZEBETA) 5 MG tablet Take 0.5 tablets (2.5 mg total) by mouth daily.  . clopidogrel (PLAVIX) 75 MG tablet TAKE 1 TABLET(75 MG) BY MOUTH DAILY  . Fluticasone-Umeclidin-Vilant (TRELEGY ELLIPTA) 100-62.5-25 MCG/INH AEPB Inhale 1 puff into the lungs daily.  . furosemide (LASIX) 40 MG tablet Take 1 tablet (40 mg total) by mouth as needed for fluid or edema.  . Hypromellose (GENTEAL MILD) 0.2 % SOLN Place 2 drops into both eyes 4 (four) times daily as needed (dry eyes).   . lansoprazole (PREVACID) 30 MG capsule Take 30 mg by mouth daily before lunch.  . losartan (COZAAR) 25 MG tablet Take 1 tablet (25 mg total) by mouth 2 (two) times daily.  [name] NASAL SALINE NA Place 1 spray into the nose daily as needed (congestion).   . nitroGLYCERIN (NITROSTAT) 0.4 MG SL tablet Place 1 tablet (0.4 mg total) under the tongue every 5 (five) minutes as needed for chest pain.  . OXYGEN 2lpm with sleep  . simethicone (MYLICON) 80 MG chewable tablet Chew 2 tablets (160 mg total) by mouth every 6 (six) hours as needed for flatulence.  [name] Spacer/Aero-Holding Chambers (AEROCHAMBER MV) inhaler Use as instructed  . Specialty Vitamins Products ([employer] MENOPAUSE SUPPORT PO) Take 1 capsule by mouth daily.  [name] spironolactone (ALDACTONE) 25 MG tablet Take 1 tablet (25 mg total) by mouth daily.                          Objective:   Physical Exam   amb talkative wf nad   04/10/2019     118  Wt 125  08/24/11 >  11/07/2011  122> 07/14/2012 123 >   01/13/2016   133 > 12/31/2017  120 >  02/14/2018  114> 03/28/2018 > 07/07/2018  101 >  10/08/2018 91   Vital signs reviewed  04/10/2019  - Note at rest 02 sats  97% on RA   HEENT : pt wearing mask not removed for exam due to covid - 19 concerns.    NECK :  without JVD/Nodes/TM/ nl carotid upstrokes bilaterally   LUNGS: no acc muscle use,  Mild barrel  contour chest wall with bilateral  Distant bs s audible wheeze and  without cough on insp or exp maneuvers  and mild  Hyperresonant  to  percussion bilaterally     CV:  RRR  no s3 or murmur or increase in P2, and no edema   ABD:  soft and nontender with pos end  insp Hoover's  in the supine position. No bruits or organomegaly appreciated, bowel sounds nl  MS:   Nl gait/  ext warm without deformities, calf tenderness, cyanosis or clubbing No obvious joint restrictions  SKIN: warm and dry without lesions    NEURO:  alert, approp, nl sensorium with  no motor or cerebellar deficits apparent.                         Assessment & Plan:

## 2019-04-10 NOTE — Assessment & Plan Note (Signed)
Quit smoking July 2014   - PFT's 11/07/2011 FEV1  0.96 (51%) ratio 50 and no better p B2, DLCO 78% - PFT's  09/02/2012  FEV1 1.38 (64 % ) ratio 66    with DLCO  73 % corrects to 93 % for alv volume   - quit smoking 09/2012 - Spirometry 12/31/2017  FEV1 0.8 (43%)  Ratio 61 p advair 250 try stiolto 2 pffs each am and return for pfts 6 weeks   - PFT's  01/08/18   FEV1 0.82 (40 % ) ratio 64  p no % improvement from saba p nothing prior to study - unable to perform  DLCO    02/14/2018  4mw 340 meters with desats > resolved as of 03/28/2018  - 03/28/2018  After extensive coaching inhaler device,  effectiveness =    75% with smi (short Ti)  - 06/23/18 started pred daily  - 07/07/2018  After extensive coaching inhaler device,  effectiveness =    90% with elipta so try trelegy one click am as already failed anoro and trelegy is on her formulary  - 08/04/2018  After extensive coaching inhaler device,  effectiveness =    50% short Ti - alpha one Screen 08/04/2018  MM level 214  - 10/08/2018  After extensive coaching inhaler device,  effectiveness =    75% 9 (short Ti)  > try bevespi due to throat irritation on trelegy > predfered trelegy    Group D in terms of symptom/risk and laba/lama/ICS  therefore appropriate rx at this point >>>  Continue trelegy daily > refills here yearly or per PCP if doing well

## 2019-04-10 NOTE — Assessment & Plan Note (Signed)
02/14/2018  Patient Saturations on Room Air at Rest = 94% then  while Ambulating = 84% - On  2 Liters of oxygen while Ambulating = 94% rec 2lpm 24/7 as of 02/14/2018 due to co-existing chf > stopped on her own as of 02/16/18  -  03/28/2018   Walked RA  2 laps @ approx 214ft each @ moderately fast pace  stopped due to end of study, no desats, no sob    - 04/10/2019 rec trial off 02/ono on RA     Probably doesn't need 02 at this point and may be irritating nose > try off and f/u is prn    Pt informed of the seriousness of COVID 19 infection as a direct risk to lung health  and safey and to close contacts and should continue to wear a facemask in public and minimize exposure to public locations but especially avoid any area or activity where non-close contacts are not observing distancing or wearing an appropriate face mask.  I strongly recommended vaccine when offered.           Each maintenance medication was reviewed in detail including emphasizing most importantly the difference between maintenance and prns and under what circumstances the prns are to be triggered using an action plan format where appropriate.  Total time for H and P, chart review, counseling,   and generating customized AVS unique to this office visit / charting = 20 m

## 2019-04-18 DIAGNOSIS — J9611 Chronic respiratory failure with hypoxia: Secondary | ICD-10-CM | POA: Diagnosis not present

## 2019-04-18 DIAGNOSIS — R05 Cough: Secondary | ICD-10-CM | POA: Diagnosis not present

## 2019-04-20 ENCOUNTER — Encounter: Payer: Self-pay | Admitting: Internal Medicine

## 2019-04-20 DIAGNOSIS — R0902 Hypoxemia: Secondary | ICD-10-CM | POA: Diagnosis not present

## 2019-04-20 DIAGNOSIS — J449 Chronic obstructive pulmonary disease, unspecified: Secondary | ICD-10-CM | POA: Diagnosis not present

## 2019-04-21 DIAGNOSIS — Z952 Presence of prosthetic heart valve: Secondary | ICD-10-CM | POA: Diagnosis not present

## 2019-04-21 DIAGNOSIS — J449 Chronic obstructive pulmonary disease, unspecified: Secondary | ICD-10-CM | POA: Diagnosis not present

## 2019-04-21 DIAGNOSIS — R635 Abnormal weight gain: Secondary | ICD-10-CM | POA: Diagnosis not present

## 2019-04-21 DIAGNOSIS — I2510 Atherosclerotic heart disease of native coronary artery without angina pectoris: Secondary | ICD-10-CM | POA: Diagnosis not present

## 2019-04-26 ENCOUNTER — Other Ambulatory Visit: Payer: Self-pay | Admitting: Interventional Cardiology

## 2019-04-29 ENCOUNTER — Telehealth: Payer: Self-pay | Admitting: Internal Medicine

## 2019-04-29 DIAGNOSIS — J9611 Chronic respiratory failure with hypoxia: Secondary | ICD-10-CM

## 2019-04-29 NOTE — Telephone Encounter (Signed)
Dr. Melvyn Novas,  I called the patient back and let her know you did not get a change to review the results, if received because you have been working with the covid patients at the hospital.  The patient was last seen by you on 04/10/19. She had the test on 04/20/19.  Just wanted to make sure that you have not received the results yet. I told her once you have she will be contacted regarding next steps and if any equipment from the [facility] needs to be removed we would place the order.

## 2019-04-30 ENCOUNTER — Other Ambulatory Visit (HOSPITAL_COMMUNITY): Payer: Self-pay

## 2019-04-30 ENCOUNTER — Ambulatory Visit (HOSPITAL_COMMUNITY)
Admission: RE | Admit: 2019-04-30 | Discharge: 2019-04-30 | Disposition: A | Discharge status: Home / Self Care | Payer: PPO | Source: Ambulatory Visit | Attending: Internal Medicine | Admitting: Internal Medicine

## 2019-04-30 ENCOUNTER — Other Ambulatory Visit: Payer: Self-pay

## 2019-04-30 DIAGNOSIS — I5022 Chronic systolic (congestive) heart failure: Secondary | ICD-10-CM

## 2019-04-30 LAB — BASIC METABOLIC PANEL
Anion gap: 7 (ref 5–15)
BUN: 13 mg/dL (ref 8–23)
CO2: 26 mmol/L (ref 22–32)
Calcium: 9.1 mg/dL (ref 8.9–10.3)
Chloride: 105 mmol/L (ref 98–111)
Creatinine, Ser: 0.77 mg/dL (ref 0.44–1.00)
GFR calc Af Amer: >60 mL/min (ref >60)
GFR calc non Af Amer: >60 mL/min (ref >60)
Glucose, Bld: 89 mg/dL (ref 70–99)
Potassium: 4.9 mmol/L (ref 3.5–5.1)
Sodium: 138 mmol/L (ref 135–145)

## 2019-04-30 NOTE — Telephone Encounter (Signed)
Called [facility] and spoke with Kennyth Lose and requested the the ONO results be faxed  Will await fax and hold in my basket

## 2019-04-30 NOTE — Telephone Encounter (Signed)
Last rec :  - 04/10/2019 rec trial off 02/ono on RA  I have not seen it - but if on my desk I will look at it  Today so be sure it's there or call for results

## 2019-05-01 NOTE — Telephone Encounter (Signed)
ONO results have been received and placed in MW's folder in A pod.   Routing to [place].

## 2019-05-04 NOTE — Telephone Encounter (Signed)
Spoke with the pt and notified of results/recs per Dr Melvyn Novas and she verbalized understanding  Order was sent to [facility] to d/c o2

## 2019-05-04 NOTE — Telephone Encounter (Signed)
No significant desats so ok to d/c noct 02

## 2019-05-16 DIAGNOSIS — J9611 Chronic respiratory failure with hypoxia: Secondary | ICD-10-CM | POA: Diagnosis not present

## 2019-05-16 DIAGNOSIS — R05 Cough: Secondary | ICD-10-CM | POA: Diagnosis not present

## 2019-06-16 DIAGNOSIS — R05 Cough: Secondary | ICD-10-CM | POA: Diagnosis not present

## 2019-06-16 DIAGNOSIS — J9611 Chronic respiratory failure with hypoxia: Secondary | ICD-10-CM | POA: Diagnosis not present

## 2019-07-13 ENCOUNTER — Other Ambulatory Visit: Payer: Self-pay | Admitting: Physician Assistant

## 2019-07-14 ENCOUNTER — Other Ambulatory Visit: Payer: Self-pay | Admitting: Internal Medicine

## 2019-07-14 MED ORDER — TRELEGY ELLIPTA 100-62.5-25 MCG/INH IN AEPB: 1.0000 | INHALATION_SPRAY | Freq: Every day | RESPIRATORY_TRACT | 11 refills | Status: DC

## 2019-07-17 ENCOUNTER — Other Ambulatory Visit: Payer: Self-pay | Admitting: Internal Medicine

## 2019-07-17 MED ORDER — TRELEGY ELLIPTA 100-62.5-25 MCG/INH IN AEPB: 1.0000 | INHALATION_SPRAY | Freq: Every day | RESPIRATORY_TRACT | 11 refills | Status: AC

## 2019-08-07 ENCOUNTER — Other Ambulatory Visit: Payer: Self-pay | Admitting: Physician Assistant

## 2019-08-26 ENCOUNTER — Other Ambulatory Visit (HOSPITAL_COMMUNITY): Payer: PPO

## 2019-08-26 ENCOUNTER — Ambulatory Visit: Payer: PPO | Admitting: Physician Assistant

## 2019-09-03 ENCOUNTER — Ambulatory Visit (HOSPITAL_COMMUNITY): Payer: PPO | Attending: Cardiology

## 2019-09-03 ENCOUNTER — Ambulatory Visit (INDEPENDENT_AMBULATORY_CARE_PROVIDER_SITE_OTHER): Payer: PPO | Admitting: Physician Assistant

## 2019-09-03 ENCOUNTER — Other Ambulatory Visit: Payer: Self-pay

## 2019-09-03 ENCOUNTER — Encounter: Payer: Self-pay | Admitting: *Deleted

## 2019-09-03 VITALS — BP 140/76 | HR 60 | Wt 128.6 lb

## 2019-09-03 DIAGNOSIS — I10 Essential (primary) hypertension: Secondary | ICD-10-CM

## 2019-09-03 DIAGNOSIS — I5043 Acute on chronic combined systolic (congestive) and diastolic (congestive) heart failure: Secondary | ICD-10-CM

## 2019-09-03 DIAGNOSIS — I2510 Atherosclerotic heart disease of native coronary artery without angina pectoris: Secondary | ICD-10-CM

## 2019-09-03 DIAGNOSIS — Z85118 Personal history of other malignant neoplasm of bronchus and lung: Secondary | ICD-10-CM | POA: Diagnosis not present

## 2019-09-03 DIAGNOSIS — I340 Nonrheumatic mitral (valve) insufficiency: Secondary | ICD-10-CM

## 2019-09-03 DIAGNOSIS — J449 Chronic obstructive pulmonary disease, unspecified: Secondary | ICD-10-CM | POA: Diagnosis not present

## 2019-09-03 DIAGNOSIS — Z952 Presence of prosthetic heart valve: Secondary | ICD-10-CM

## 2019-09-03 MED ORDER — PERFLUTREN LIPID MICROSPHERE
1.0000 mL | INTRAVENOUS | Status: AC | PRN
  Administered 2019-09-03: 4 mL via INTRAVENOUS

## 2019-09-03 NOTE — Patient Instructions (Addendum)
Medication Instructions:  No changes *If you need a refill on your cardiac medications before your next appointment, please call your pharmacy*   Lab Work: None  Testing/Procedures: none   Follow-Up:   With Dr. Haroldine Laws in 6 months  Other Instructions Please continue to monitor BP at home.  Call if consistently get readings greater than 140/90.

## 2019-09-03 NOTE — Progress Notes (Addendum)
[facility]                                       Cardiology Office Note    Date:  09/04/2019   ID:  Teresa [name], DOB February 03, 1951, MRN [id number]  PCP:  Leeroy Cha, MD  Cardiologist:  Larae Grooms, MD / Dr. Angelena Form & Dr. Roxy Manns (TAVR)- more recently followed by Dr. Haroldine Laws  CC: 1 year s/p TAVR  History of Present Illness:  Teresa [name] is a 69 y.o. female with a history of CAD (prior stenting of the LM, LAD and circumflex in the setting of dissection), prior RCA stenting, AAA, chronic bronchitis, chronic combined S/D CHF, GERD, HLD, PAD, ischemic cardiomyopathy with functional MR, lung cancer 2014requiring RUL resection, tobacco abuse and severe AS s/p TAVR (08/26/18) who presents for follow up.   In June she saw Dr Melvyn Novas for increased dyspnea. Dyspnea was thought to be from severe AS and she was sent back to [facility]. She was last seen 08/12/2018. She was volume overloaded and afollow upecho demonstrated severely reduced LVF with EF 25-30% and severe AS with mean gradient 47 mmHg and severe MR.Cardiac Catheterizationwas arranged and demonstrated patent stents, a chronic occlusion in the OM3 with R-L collaterals and overall stable anatomy. She was volume overloaded and admitted for diuresis. She was seen by Dr. Roxy Manns and decision was made to proceed with TAVR. She underwent successful TAVR on 6/23 with L transaxillary approach. Post op course was notable for volume overload requiring aggressive diuresis.Her post op echo showed EF 20-25, severe MR and well functioning AVR. She was followed by the HF team and her meds were optimized with ARB, lasix, and digoxin It was felt she may need MitralClip if her MR did not improve. 1 month echo s/p TAVR on 09/24/18 showed improvement of EF to 30-35%, mild centrally directed MR, normally functioning TAVR with a mean gradient of 17 mm Hg and trivial PVL. She has done excellent with  marked clinical imrpovement. Repeat echo 03/2019 showed improvement in EF to 45-50% with mild MR and normally functioning TAVR. There was a question of an apical thrombus but repeat limited echo with definitiy did not show thrombus.  Today she presents to clinic for follow up. No CP or SOB. No LE edema, orthopnea or PND. Has occasional dizziness but no syncope. No blood in stool or urine. No palpitations. Had covid in December 2020. She is walking an hour every morning. Had had intermittent pain behind her umbilicus that has gotten better with cutting out milk.     Past Medical History:  Diagnosis Date  . AAA (abdominal aortic aneurysm) ([place])    a. small by cath 2014.  [name] Acute on chronic combined systolic and diastolic heart failure ([place]) 08/19/2018  . Arthritis    "across my hips; buttocks; comes w/the weather" (02/27/2013)  . CAD (coronary artery disease), native coronary artery    a. 10/2012 - rotational atherectomy of LAD c/b dissection s/p PCI to LAD, LCx, LM. b. 12/2012 - s/p overlapping DES to RCA.  . Cataract    "just the beginnings on the right" (02/27/2013)  . Chronic combined systolic and diastolic CHF (congestive heart failure) ([place])   . COPD (chronic obstructive pulmonary disease) ([place])   . Coronary artery disease   . GERD (gastroesophageal reflux disease)   . Hypercholesterolemia   .  Ischemic cardiomyopathy    a. EF 45-50%.  . Mass of lung    "small tumor RUL; they are watching it" (02/27/2013)  . Mitral regurgitation   . Peripheral vascular disease, unspecified ([place])   . S/P TAVR (transcatheter aortic valve replacement)    23 mm Edwards Sapien 3 transcatheter heart valve placed via open left transaxillary approach   . Severe aortic stenosis   . Severe mitral regurgitation   . Tachycardia, unspecified   . Tobacco abuse     Past Surgical History:  Procedure Laterality Date  . CARDIAC CATHETERIZATION  08/2018  . CORONARY ANGIOPLASTY WITH STENT PLACEMENT  10/2012;  12/08/2012   "7 + 3" (02/27/2013)  . ESOPHAGOGASTRODUODENOSCOPY (EGD) WITH PROPOFOL N/A 01/11/2015   Procedure: ESOPHAGOGASTRODUODENOSCOPY (EGD) WITH PROPOFOL;  Surgeon: Garlan Fair, MD;  Location: [facility] ENDOSCOPY;  Service: Endoscopy;  Laterality: N/A;  . FLEXIBLE BRONCHOSCOPY N/A 03/02/2013   Procedure: FLEXIBLE BRONCHOSCOPY;  Surgeon: Gaye Pollack, MD;  Location: [place];  Service: Thoracic;  Laterality: N/A;  . FLEXIBLE SIGMOIDOSCOPY N/A 01/11/2015   Procedure: FLEXIBLE SIGMOIDOSCOPY;  Surgeon: Garlan Fair, MD;  Location: [facility] ENDOS[facility];  Service: Endoscopy;  Laterality: N/A;  unable to complete colon-prep issues  . GANGLION CYST EXCISION Left 1975   "wrist"  . LEFT HEART CATH AND CORONARY ANGIOGRAPHY N/A 06/29/2016   Procedure: Left Heart Cath and Coronary Angiography;  Surgeon: Jettie Booze, MD;  Location: [place] CV LAB;  Service: Cardiovascular;  Laterality: N/A;  . LEFT HEART CATH AND CORONARY ANGIOGRAPHY N/A 08/19/2018   Procedure: LEFT HEART CATH AND CORONARY ANGIOGRAPHY;  Surgeon: Burnell Blanks, MD;  Location: [place] CV LAB;  Service: Cardiovascular;  Laterality: N/A;  . LEFT HEART CATHETERIZATION WITH CORONARY ANGIOGRAM N/A 12/08/2012   Procedure: LEFT HEART CATHETERIZATION WITH CORONARY ANGIOGRAM;  Surgeon: Jettie Booze, MD;  Location: [facility] CATH LAB;  Service: Cardiovascular;  Laterality: N/A;  . MOUTH SURGERY  2010?   "for bone loss" (06/24/2012)  . PERCUTANEOUS CORONARY STENT INTERVENTION (PCI-S) N/A 10/09/2012   Procedure: PERCUTANEOUS CORONARY STENT INTERVENTION (PCI-S);  Surgeon: Jettie Booze, MD;  Location: [facility] CATH LAB;  Service: Cardiovascular;  Laterality: N/A;  . RIGHT HEART CATH N/A 08/19/2018   Procedure: RIGHT HEART CATH;  Surgeon: Burnell Blanks, MD;  Location: [place] CV LAB;  Service: Cardiovascular;  Laterality: N/A;  . TEE WITHOUT CARDIOVERSION N/A 08/22/2018   Procedure: TRANSESOPHAGEAL ECHOCARDIOGRAM (TEE);   Surgeon: Pixie Casino, MD;  Location: [facility] ENDOSCOPY;  Service: Cardiovascular;  Laterality: N/A;  . TEE WITHOUT CARDIOVERSION N/A 08/26/2018   Procedure: TRANSESOPHAGEAL ECHOCARDIOGRAM (TEE);  Surgeon: Burnell Blanks, MD;  Location: [place];  Service: Open Heart Surgery;  Laterality: N/A;  . THORACOTOMY/LOBECTOMY Right 03/02/2013   Procedure: Right Video Assisted Thoracoscopy/Thoracotomy with upper Lobectomy;  Surgeon: Gaye Pollack, MD;  Location: [facility] OR;  Service: Thoracic;  Laterality: Right;  Right Lung Upper  Lobectomy   . TONSILLECTOMY  1960  . VIDEO BRONCHOSCOPY  09/12/2011   Procedure: VIDEO BRONCHOSCOPY WITH FLUORO;  Surgeon: Tanda Rockers, MD;  Location: [facility];  Service: Cardiopulmonary;  Laterality: Bilateral;    Current Medications: Outpatient Medications Prior to Visit  Medication Sig Dispense Refill  . albuterol (PROAIR HFA) 108 (90 Base) MCG/ACT inhaler Inhale 2 puffs into the lungs every 6 (six) hours as needed for wheezing or shortness of breath.    [name] albuterol (PROVENTIL) (2.5 MG/3ML) 0.083% nebulizer solution Take 3 mLs (2.5 mg total) by  nebulization every 4 (four) hours as needed for wheezing or shortness of breath. 75 mL 12  . bisoprolol (ZEBETA) 5 MG tablet Take 0.5 tablets (2.5 mg total) by mouth daily. 15 tablet 6  . clopidogrel (PLAVIX) 75 MG tablet TAKE 1 TABLET(75 MG) BY MOUTH DAILY. Please keep upcoming appt in July for future refills. Thank you 90 tablet 0  . Fluticasone-Umeclidin-Vilant (TRELEGY ELLIPTA) 100-62.5-25 MCG/INH AEPB Inhale 1 puff into the lungs daily. 60 each 11  . furosemide (LASIX) 40 MG tablet Take 1 tablet (40 mg total) by mouth as needed for fluid or edema.    . Hypromellose (GENTEAL MILD) 0.2 % SOLN Place 2 drops into both eyes 4 (four) times daily as needed (dry eyes).     . lansoprazole (PREVACID) 30 MG capsule Take 30 mg by mouth daily before lunch.    . losartan (COZAAR) 25 MG tablet Take 1 tablet (25 mg total) by mouth 2  (two) times daily. 180 tablet 3  . NASAL SALINE NA Place 1 spray into the nose daily as needed (congestion).     . nitroGLYCERIN (NITROSTAT) 0.4 MG SL tablet PLACE 1 TABLET UNDER THE TONGUE EVERY 5 MINUTES AS NEEDED FOR CHEST PAIN 25 tablet 4  . PRALUENT 150 MG/ML SOAJ INJECT 1 PEN INTO THE SKIN EVERY 14 (FOURTEEN) DAYS. 6 mL 3  . simethicone (MYLICON) 80 MG chewable tablet Chew 2 tablets (160 mg total) by mouth every 6 (six) hours as needed for flatulence. 30 tablet 0  . Spacer/Aero-Holding Chambers (AEROCHAMBER MV) inhaler Use as instructed 1 each 0  . Specialty Vitamins Products ([employer] MENOPAUSE SUPPORT PO) Take 1 capsule by mouth daily.    [name] spironolactone (ALDACTONE) 25 MG tablet Take 1 tablet (25 mg total) by mouth daily. 90 tablet 3  . OXYGEN 2lpm with sleep (Patient not taking: Reported on 09/03/2019)     No facility-administered medications prior to visit.     Allergies:   Azithromycin, Ceclor [cefaclor], Penicillins, Septra [sulfamethoxazole-trimethoprim], Doxycycline, Lipitor [atorvastatin], Morphine and related, Simvastatin, Zetia [ezetimibe], Adhesive [tape], and Vicodin [hydrocodone-acetaminophen]   Social History   Socioeconomic History  . Marital status: Married    Spouse name: Not on file  . Number of children: 1  . Years of education: 11  . Highest education level: High school graduate  Occupational History  . 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 Smoking status: Former Smoker    Packs/day: 0.50    Years: 40.00    Pack years: 20.00    Types: Cigarettes    Quit date: 10/08/2012    Years since quitting: 6.9  . Smokeless tobacco: Never Used  Vaping Use  . Vaping Use: Never used  Substance and Sexual Activity  . Alcohol use: No  . Drug use: No  . Sexual activity: Yes  Other Topics Concern  . Not on file  Social History Narrative  . Not on file   Social Determinants of Health   Financial Resource Strain: Low Risk   . Difficulty of Paying Living  Expenses: Not hard at all  Food Insecurity: No Food Insecurity  . Worried About Charity fundraiser in the Last Year: Never true  . Ran Out of Food in the Last Year: Never true  Transportation Needs: No Transportation Needs  . Lack of Transportation (Medical): No  . Lack of Transportation (Non-Medical): No  Physical Activity: Insufficiently Active  . Days of Exercise per Week: 4 days  . Minutes of Exercise per  Session: 30 min  Stress: No Stress Concern Present  . Feeling of Stress : Only a little  Social Connections:   . Frequency of Communication with Friends and Family:   . Frequency of Social Gatherings with Friends and Family:   . Attends Religious Services:   . Active Member of Clubs or Organizations:   . Attends Archivist Meetings:   [name] Marital Status:      Family History:  The patient's family history includes Alcohol abuse in her father; Lupus in her mother; Migraines in her sister; Other in her sister; Pulmonary fibrosis in her brother.     ROS:   Please see the history of present illness.    ROS All other systems reviewed and are negative.   PHYSICAL EXAM:   VS:  BP 140/76 (BP Location: Right Arm, Patient Position: Sitting, Cuff Size: Normal)   Pulse 60   Wt 128 lb 9.6 oz (58.3 kg)   LMP 03/25/2011   BMI 25.97 kg/m    GEN: Well nourished, well developed, in no acute distress HEENT: normal Neck: no JVD or masses Cardiac: RRR; no murmurs, rubs, or gallops,no edema  Respiratory:  clear to auscultation bilaterally, normal work of breathing GI: soft, nontender, nondistended, + BS MS: no deformity or atrophy Skin: warm and dry, no rash Neuro:  Alert and Oriented x 3, Strength and sensation are intact Psych: euthymic mood, full affect   Wt Readings from Last 3 Encounters:  09/03/19 128 lb 9.6 oz (58.3 kg)  04/10/19 118 lb (53.5 kg)  03/25/19 113 lb 9.6 oz (51.5 kg)      Studies/Labs Reviewed:   EKG:  EKG is NOT ordered today.    Recent  Labs: 12/09/2018: B Natriuretic Peptide 201.4 01/05/2019: ALT 17; Hemoglobin 11.8; Platelet Count 250 04/30/2019: BUN 13; Creatinine, Ser 0.77; Potassium 4.9; Sodium 138   Lipid Panel    Component Value Date/Time   CHOL 152 08/18/2015 0840   TRIG 105 08/18/2015 0840   HDL 65 08/18/2015 0840   CHOLHDL 2.3 08/18/2015 0840   VLDL 21 08/18/2015 0840   LDLCALC 66 08/18/2015 0840    Additional studies/ records that were reviewed today include:  CARDIOTHORACIC SURGERY OPERATIVE NOTE  Date of Procedure:08/26/2018  Preoperative Diagnosis:  Severe Aortic Stenosis  Severe Mitral Regurgitation  Postoperative Diagnosis:Same   Procedure:   Transcatheter Aortic Valve Replacement -Open LeftTransaxillaryApproach Edwards Sapien 3 Transcatheter Heart Valve (size 79mm, model # 9600TFX, serial # [id number])  Co-Surgeons:Clarence H. Roxy Manns, MD and Lauree Chandler, MD  Anesthesiologist:Adam Marcie Bal, MD  Dala Dock, MD  Pre-operative Echo Findings: ? Severe aortic stenosis ? Severe mitral regurgitation ? Severeleft ventricular systolic dysfunction  Post-operative Echo Findings: ? Mildparavalvular leak ? Unchangedleft ventricular systolic function  ________________  Echo 08/27/18 IMPRESSIONS 1. The left ventricle has severely reduced systolic function, with an ejection fraction of 20-25%. The cavity size was mildly dilated. Left ventricular diastolic Doppler parameters are consistent with pseudonormalization. Left ventricular diffuse  hypokinesis. 2. Left atrial size was moderately dilated. 3. Trivial pericardial effusion is present. 4. The mitral valve is degenerative. There is mild mitral annular calcification present. Mitral valve regurgitation is severe by color flow Doppler. There is restriction of the posterior mitral leaflet  with posteriorly-directed mitral regurgitation.  Possible infarct-related mitral regurgitation. No evidence of mitral valve stenosis. 5. Tricuspid valve regurgitation is mild-moderate. 6. Bioprosthetic aortic valve s/p TAVR. Mean gradient 10 mmHg, no significant stenosis. No significant regurgitation noted. 7. The right ventricle has moderately reduced  systolic function. The cavity was normal. There is no increase in right ventricular wall thickness. 8. The inferior vena cava was normal in size with <50% respiratory variability. PA systolic pressure 53 mmHg.  __________________  Echo 09/25/18 IMPRESSIONS 1. The left ventricle has moderate-severely reduced systolic function, with an ejection fraction of 30-35%. The cavity size was mildly dilated. Left ventricular diastolic Doppler parameters are consistent with impaired relaxation. Left ventrical global  hypokinesis without regional wall motion abnormalities. 2. The right ventricle has normal systolic function. The cavity was normal. There is no increase in right ventricular wall thickness. Right ventricular systolic pressure could not be assessed. 3. Left atrial size was moderately dilated. 4. Mild thickening of the mitral valve leaflet. There is mild mitral annular calcification present. The MR jet is centrally-directed. 5. A 23mm an Wende Crease Sapien bioprosthetic aortic valve (TAVR) valve is present in the aortic position. 6. The aorta is normal in size and structure. 7. The aortic root and ascending aorta are normal in size and structure. 8. When compared to the prior study: 08/26/2018, there is improved left ventricular systolic function, markedly reduced mitral insufficiency and a substantial reduction in TAVR perivalvular leak.  __________________  Echo 09/03/2019 IMPRESSIONS  1. 1 year post TAVR, normal mean transaortic gradient 14 mmHg, mild paravalvular leak.  2. There is an apical aneurysm with no evidence for a  thrombus.  3. Left ventricular ejection fraction, by estimation, is 40 to 45%. The left ventricle has mildly decreased function. The left ventricle demonstrates regional wall motion abnormalities (see scoring diagram/findings for description). Left ventricular  diastolic parameters are consistent with Grade I diastolic dysfunction (impaired relaxation). Elevated left atrial pressure.  4. Right ventricular systolic function is normal. The right ventricular size is normal. There is mildly elevated pulmonary artery systolic pressure. The estimated right ventricular systolic pressure is 42.6 mmHg.  5. Left atrial size was mildly dilated.  6. The mitral valve is normal in structure. Mild mitral valve regurgitation. No evidence of mitral stenosis.  7. The aortic valve is normal in structure. Aortic valve regurgitation is trivial. No aortic stenosis is present. There is a 23 mm Edwards Sapien prosthetic (TAVR) valve present in the aortic position. Procedure Date: 08/26/2018. Aortic valve mean  gradient measures 14.0 mmHg.  8. The inferior vena cava is normal in size with greater than 50% respiratory variability, suggesting right atrial pressure of 3 mmHg.   ASSESSMENT & PLAN:   Severe AS s/p TAVR: echo today shows EF 40-45%, normally functioning TAVR with a mean gradient of 14 mmHg and trivial-mild PVL. She has NYHA class I symptoms and walks an hour every morning with no issues. SBE prophylaxis discussed; she has clindamycin. Continue on monotherapy with plavix (has been on this for years). Continue regular follow up with Dr. Haroldine Laws   Chronic systolic CHF: EF 83-41%, the entire apex is aneurysmal with no evidence of thrombus. She appears euvolemic. Continue GDMT with Losartan 25mg  daily, bisoprolol 2.5mg  daily, spiro 25mg  daily, and lasix 40mg  PRN (has not had to use recently).  Functional MR: mitral regurgitation mild by echo today.   HTN: BP mildly elevated today. 144/64 on my personal recheck. I  reviewed extensive BP records which shows BP has generally been very well controlled. This morning it was mildly elevated with SBP ~138. She will send me a Myhchart message if her BP continues to remain elevated and I will adjust her medications.    CAD: no chest pain. Continue medical therapy. Intolerant to  statins.   COPD: quit smoking 2014. Followed by Dr. Melvyn Novas  Hx of lung CA: s/p resection in 2014   Medication Adjustments/Labs and Tests Ordered: Current medicines are reviewed at length with the patient today.  Concerns regarding medicines are outlined above.  Medication changes, Labs and Tests ordered today are listed in the Patient Instructions below. Patient Instructions  Medication Instructions:  No changes *If you need a refill on your cardiac medications before your next appointment, please call your pharmacy*   Lab Work: None  Testing/Procedures: none   Follow-Up:   With Dr. Haroldine Laws in 6 months  Other Instructions Please continue to monitor BP at home.  Call if consistently get readings greater than 140/90.     Signed, Angelena Form, PA-C  09/04/2019 9:53 AM    [facility] [address], [place], [place]  [address] Phone: [phone]; Fax: [phone]

## 2019-10-26 DIAGNOSIS — H52203 Unspecified astigmatism, bilateral: Secondary | ICD-10-CM | POA: Diagnosis not present

## 2019-10-26 DIAGNOSIS — H2513 Age-related nuclear cataract, bilateral: Secondary | ICD-10-CM | POA: Diagnosis not present

## 2019-10-26 DIAGNOSIS — H5213 Myopia, bilateral: Secondary | ICD-10-CM | POA: Diagnosis not present

## 2019-10-26 DIAGNOSIS — H524 Presbyopia: Secondary | ICD-10-CM | POA: Diagnosis not present

## 2019-10-27 DIAGNOSIS — Z1389 Encounter for screening for other disorder: Secondary | ICD-10-CM | POA: Diagnosis not present

## 2019-10-27 DIAGNOSIS — K219 Gastro-esophageal reflux disease without esophagitis: Secondary | ICD-10-CM | POA: Diagnosis not present

## 2019-10-27 DIAGNOSIS — Z952 Presence of prosthetic heart valve: Secondary | ICD-10-CM | POA: Diagnosis not present

## 2019-10-27 DIAGNOSIS — Z7189 Other specified counseling: Secondary | ICD-10-CM | POA: Diagnosis not present

## 2019-10-27 DIAGNOSIS — I2510 Atherosclerotic heart disease of native coronary artery without angina pectoris: Secondary | ICD-10-CM | POA: Diagnosis not present

## 2019-10-27 DIAGNOSIS — J449 Chronic obstructive pulmonary disease, unspecified: Secondary | ICD-10-CM | POA: Diagnosis not present

## 2019-10-27 DIAGNOSIS — R635 Abnormal weight gain: Secondary | ICD-10-CM | POA: Diagnosis not present

## 2019-10-27 DIAGNOSIS — Z0000 Encounter for general adult medical examination without abnormal findings: Secondary | ICD-10-CM | POA: Diagnosis not present

## 2019-10-27 DIAGNOSIS — E785 Hyperlipidemia, unspecified: Secondary | ICD-10-CM | POA: Diagnosis not present

## 2019-10-27 DIAGNOSIS — I700 Atherosclerosis of aorta: Secondary | ICD-10-CM | POA: Diagnosis not present

## 2019-10-27 DIAGNOSIS — M85852 Other specified disorders of bone density and structure, left thigh: Secondary | ICD-10-CM | POA: Diagnosis not present

## 2019-10-27 DIAGNOSIS — J301 Allergic rhinitis due to pollen: Secondary | ICD-10-CM | POA: Diagnosis not present

## 2019-10-27 DIAGNOSIS — G720 Drug-induced myopathy: Secondary | ICD-10-CM | POA: Diagnosis not present

## 2019-10-28 ENCOUNTER — Other Ambulatory Visit: Payer: Self-pay | Admitting: Physician Assistant

## 2020-01-04 ENCOUNTER — Ambulatory Visit (HOSPITAL_COMMUNITY)
Admission: RE | Admit: 2020-01-04 | Discharge: 2020-01-04 | Disposition: A | Discharge status: Home / Self Care | Payer: PPO | Source: Ambulatory Visit | Attending: Physician Assistant | Admitting: Physician Assistant

## 2020-01-04 ENCOUNTER — Inpatient Hospital Stay: Payer: PPO | Attending: Internal Medicine

## 2020-01-04 ENCOUNTER — Other Ambulatory Visit: Payer: Self-pay

## 2020-01-04 DIAGNOSIS — I2510 Atherosclerotic heart disease of native coronary artery without angina pectoris: Secondary | ICD-10-CM | POA: Diagnosis not present

## 2020-01-04 DIAGNOSIS — C3411 Malignant neoplasm of upper lobe, right bronchus or lung: Secondary | ICD-10-CM | POA: Insufficient documentation

## 2020-01-04 DIAGNOSIS — M47814 Spondylosis without myelopathy or radiculopathy, thoracic region: Secondary | ICD-10-CM | POA: Diagnosis not present

## 2020-01-04 DIAGNOSIS — I255 Ischemic cardiomyopathy: Secondary | ICD-10-CM | POA: Diagnosis not present

## 2020-01-04 DIAGNOSIS — E7800 Pure hypercholesterolemia, unspecified: Secondary | ICD-10-CM | POA: Diagnosis not present

## 2020-01-04 DIAGNOSIS — I739 Peripheral vascular disease, unspecified: Secondary | ICD-10-CM | POA: Diagnosis not present

## 2020-01-04 DIAGNOSIS — M1990 Unspecified osteoarthritis, unspecified site: Secondary | ICD-10-CM | POA: Diagnosis not present

## 2020-01-04 DIAGNOSIS — Z902 Acquired absence of lung [part of]: Secondary | ICD-10-CM | POA: Diagnosis not present

## 2020-01-04 DIAGNOSIS — I5042 Chronic combined systolic (congestive) and diastolic (congestive) heart failure: Secondary | ICD-10-CM | POA: Insufficient documentation

## 2020-01-04 DIAGNOSIS — K219 Gastro-esophageal reflux disease without esophagitis: Secondary | ICD-10-CM | POA: Insufficient documentation

## 2020-01-04 DIAGNOSIS — J449 Chronic obstructive pulmonary disease, unspecified: Secondary | ICD-10-CM | POA: Diagnosis not present

## 2020-01-04 DIAGNOSIS — Z79899 Other long term (current) drug therapy: Secondary | ICD-10-CM | POA: Insufficient documentation

## 2020-01-04 DIAGNOSIS — J432 Centrilobular emphysema: Secondary | ICD-10-CM | POA: Diagnosis not present

## 2020-01-04 LAB — CMP (CANCER CENTER ONLY)
ALT: 11 U/L (ref 0–44)
AST: 16 U/L (ref 15–41)
Albumin: 4.2 g/dL (ref 3.5–5.0)
Alkaline Phosphatase: 77 U/L (ref 38–126)
Anion gap: 5 (ref 5–15)
BUN: 17 mg/dL (ref 8–23)
CO2: 28 mmol/L (ref 22–32)
Calcium: 9.6 mg/dL (ref 8.9–10.3)
Chloride: 104 mmol/L (ref 98–111)
Creatinine: 0.91 mg/dL (ref 0.44–1.00)
GFR, Estimated: >60 mL/min (ref >60)
Glucose, Bld: 88 mg/dL (ref 70–99)
Potassium: 4.1 mmol/L (ref 3.5–5.1)
Sodium: 137 mmol/L (ref 135–145)
Total Bilirubin: 0.7 mg/dL (ref 0.3–1.2)
Total Protein: 7.6 g/dL (ref 6.5–8.1)

## 2020-01-04 LAB — CBC WITH DIFFERENTIAL (CANCER CENTER ONLY)
Abs Immature Granulocytes: 0.02 10*3/uL (ref 0.00–0.07)
Basophils Absolute: 0.1 10*3/uL (ref 0.0–0.1)
Basophils Relative: 1 %
Eosinophils Absolute: 0.2 10*3/uL (ref 0.0–0.5)
Eosinophils Relative: 3 %
HCT: 39.1 % (ref 36.0–46.0)
Hemoglobin: 12.6 g/dL (ref 12.0–15.0)
Immature Granulocytes: 0 %
Lymphocytes Relative: 32 %
Lymphs Abs: 2.2 10*3/uL (ref 0.7–4.0)
MCH: 30.1 pg (ref 26.0–34.0)
MCHC: 32.2 g/dL (ref 30.0–36.0)
MCV: 93.3 fL (ref 80.0–100.0)
Monocytes Absolute: 0.6 10*3/uL (ref 0.1–1.0)
Monocytes Relative: 9 %
Neutro Abs: 3.8 10*3/uL (ref 1.7–7.7)
Neutrophils Relative %: 55 %
Platelet Count: 297 10*3/uL (ref 150–400)
RBC: 4.19 MIL/uL (ref 3.87–5.11)
RDW: 13 % (ref 11.5–15.5)
WBC Count: 6.9 10*3/uL (ref 4.0–10.5)
nRBC: 0 % (ref 0.0–0.2)

## 2020-01-04 MED ORDER — IOHEXOL 300 MG/ML  SOLN
75.0000 mL | Freq: Once | INTRAMUSCULAR | Status: AC | PRN
  Administered 2020-01-04: 75 mL via INTRAVENOUS

## 2020-01-07 ENCOUNTER — Encounter: Payer: Self-pay | Admitting: Internal Medicine

## 2020-01-07 ENCOUNTER — Other Ambulatory Visit: Payer: Self-pay

## 2020-01-07 ENCOUNTER — Inpatient Hospital Stay: Payer: PPO | Admitting: Internal Medicine

## 2020-01-07 VITALS — BP 139/63 | HR 58 | Temp 97.3°F | Resp 18 | Ht 59.0 in | Wt 132.7 lb

## 2020-01-07 DIAGNOSIS — C3411 Malignant neoplasm of upper lobe, right bronchus or lung: Secondary | ICD-10-CM

## 2020-01-07 NOTE — Progress Notes (Signed)
[place] Telephone:([phone]) [phone]   [phone]  OFFICE PROGRESS NOTE  Leeroy Cha, MD [address] [place] [address]  DIAGNOSIS: Stage IB (T2a., N0, M0) poorly differentiated squamous cell carcinoma, diagnosed in December of 2014.  PRIOR THERAPY: Flexible video bronchoscopy, right upper lobectomy with mediastinal lymph node dissection under the care of Dr. Cyndia Bent.   CURRENT THERAPY: Observation.  INTERVAL HISTORY: Teresa [name] 69 y.o. female returns to the clinic today for annual follow-up visit.  The patient is feeling fine today with no concerning complaints.  She denied having any chest pain, shortness of breath, cough or hemoptysis.  She denied having any fever or chills.  She has no nausea, vomiting, diarrhea or constipation.  She denied having any headache or visual changes.  She is here today for evaluation with repeat CT scan of the chest for restaging of her disease.  MEDICAL HISTORY: Past Medical History:  Diagnosis Date  . AAA (abdominal aortic aneurysm) ([place])    a. small by cath 2014.  [name] Acute on chronic combined systolic and diastolic heart failure ([place]) 08/19/2018  . Arthritis    "across my hips; buttocks; comes w/the weather" (02/27/2013)  . CAD (coronary artery disease), native coronary artery    a. 10/2012 - rotational atherectomy of LAD c/b dissection s/p PCI to LAD, LCx, LM. b. 12/2012 - s/p overlapping DES to RCA.  . Cataract    "just the beginnings on the right" (02/27/2013)  . Chronic combined systolic and diastolic CHF (congestive heart failure) ([place])   . COPD (chronic obstructive pulmonary disease) ([place])   . Coronary artery disease   . GERD (gastroesophageal reflux disease)   . Hypercholesterolemia   . Ischemic cardiomyopathy    a. EF 45-50%.  . Mass of lung    "small tumor RUL; they are watching it" (02/27/2013)  . Mitral regurgitation   . Peripheral vascular disease, unspecified ([place])   .  S/P TAVR (transcatheter aortic valve replacement)    23 mm Edwards Sapien 3 transcatheter heart valve placed via open left transaxillary approach   . Severe aortic stenosis   . Severe mitral regurgitation   . Tachycardia, unspecified   . Tobacco abuse     ALLERGIES:  is allergic to azithromycin, ceclor [cefaclor], penicillins, septra [sulfamethoxazole-trimethoprim], doxycycline, lipitor [atorvastatin], morphine and related, simvastatin, zetia [ezetimibe], adhesive [tape], and vicodin [hydrocodone-acetaminophen].  MEDICATIONS:  Current Outpatient Medications  Medication Sig Dispense Refill  . albuterol (PROAIR HFA) 108 (90 Base) MCG/ACT inhaler Inhale 2 puffs into the lungs every 6 (six) hours as needed for wheezing or shortness of breath.    [name] albuterol (PROVENTIL) (2.5 MG/3ML) 0.083% nebulizer solution Take 3 mLs (2.5 mg total) by nebulization every 4 (four) hours as needed for wheezing or shortness of breath. 75 mL 12  . bisoprolol (ZEBETA) 5 MG tablet TAKE 1/2 TABLET BY MOUTH DAILY 90 tablet 3  . clopidogrel (PLAVIX) 75 MG tablet TAKE 1 TABLET BY MOUTH EVERY DAY 90 tablet 0  . Fluticasone-Umeclidin-Vilant (TRELEGY ELLIPTA) 100-62.5-25 MCG/INH AEPB Inhale 1 puff into the lungs daily. 60 each 11  . furosemide (LASIX) 40 MG tablet Take 1 tablet (40 mg total) by mouth as needed for fluid or edema.    . Hypromellose (GENTEAL MILD) 0.2 % SOLN Place 2 drops into both eyes 4 (four) times daily as needed (dry eyes).     . lansoprazole (PREVACID) 30 MG capsule Take 30 mg by mouth daily before  lunch.    . losartan (COZAAR) 25 MG tablet Take 1 tablet (25 mg total) by mouth 2 (two) times daily. 180 tablet 3  . NASAL SALINE NA Place 1 spray into the nose daily as needed (congestion).     . nitroGLYCERIN (NITROSTAT) 0.4 MG SL tablet PLACE 1 TABLET UNDER THE TONGUE EVERY 5 MINUTES AS NEEDED FOR CHEST PAIN 25 tablet 4  . OXYGEN 2lpm with sleep (Patient not taking: Reported on 09/03/2019)    . PRALUENT 150  MG/ML SOAJ INJECT 1 PEN INTO THE SKIN EVERY 14 (FOURTEEN) DAYS. 6 mL 3  . simethicone (MYLICON) 80 MG chewable tablet Chew 2 tablets (160 mg total) by mouth every 6 (six) hours as needed for flatulence. 30 tablet 0  . Spacer/Aero-Holding Chambers (AEROCHAMBER MV) inhaler Use as instructed 1 each 0  . Specialty Vitamins Products ([facility]S MENOPAUSE SUPPORT PO) Take 1 capsule by mouth daily.    [name] spironolactone (ALDACTONE) 25 MG tablet Take 1 tablet (25 mg total) by mouth daily. 90 tablet 3   No current facility-administered medications for this visit.    SURGICAL HISTORY:  Past Surgical History:  Procedure Laterality Date  . CARDIAC CATHETERIZATION  08/2018  . CORONARY ANGIOPLASTY WITH STENT PLACEMENT  10/2012; 12/08/2012   "7 + 3" (02/27/2013)  . ESOPHAGOGASTRODUODENOSCOPY (EGD) WITH PROPOFOL N/A 01/11/2015   Procedure: ESOPHAGOGASTRODUODENOSCOPY (EGD) WITH PROPOFOL;  Surgeon: Garlan Fair, MD;  Location: [facility];  Service: Endoscopy;  Laterality: N/A;  . FLEXIBLE BRONCHOSCOPY N/A 03/02/2013   Procedure: FLEXIBLE BRONCHOSCOPY;  Surgeon: Gaye Pollack, MD;  Location: [facility];  Service: Thoracic;  Laterality: N/A;  . FLEXIBLE SIGMOIDOSCOPY N/A 01/11/2015   Procedure: FLEXIBLE SIGMOIDOSCOPY;  Surgeon: Garlan Fair, MD;  Location: [facility];  Service: Endoscopy;  Laterality: N/A;  unable to complete colon-prep issues  . GANGLION CYST EXCISION Left 1975   "wrist"  . LEFT HEART CATH AND CORONARY ANGIOGRAPHY N/A 06/29/2016   Procedure: Left Heart Cath and Coronary Angiography;  Surgeon: Jettie Booze, MD;  Location: [facility];  Service: Cardiovascular;  Laterality: N/A;  . LEFT HEART CATH AND CORONARY ANGIOGRAPHY N/A 08/19/2018   Procedure: LEFT HEART CATH AND CORONARY ANGIOGRAPHY;  Surgeon: Burnell Blanks, MD;  Location: [facility];  Service: Cardiovascular;  Laterality: N/A;  . LEFT HEART CATHETERIZATION WITH CORONARY ANGIOGRAM N/A 12/08/2012   Procedure:  LEFT HEART CATHETERIZATION WITH CORONARY ANGIOGRAM;  Surgeon: Jettie Booze, MD;  Location: [facility] C[facility] LAB;  Service: Cardiovascular;  Laterality: N/A;  . MOUTH SURGERY  2010?   "for bone loss" (06/24/2012)  . PERCUTANEOUS CORONARY STENT INTERVENTION (PCI-S) N/A 10/09/2012   Procedure: PERCUTANEOUS CORONARY STENT INTERVENTION (PCI-S);  Surgeon: Jettie Booze, MD;  Location: [facility] C[facility] LAB;  Service: Cardiovascular;  Laterality: N/A;  . RIGHT HEART CATH N/A 08/19/2018   Procedure: RIGHT HEART CATH;  Surgeon: Burnell Blanks, MD;  Location: [facility] [facility] CV LAB;  Service: Cardiovascular;  Laterality: N/A;  . TEE WITHOUT CARDIOVERSION N/A 08/22/2018   Procedure: TRANSESOPHAGEAL ECHOCARDIOGRAM (TEE);  Surgeon: Pixie Casino, MD;  Location: [facility] ENDOSCOPY;  Service: Cardiovascular;  Laterality: N/A;  . TEE WITHOUT CARDIOVERSION N/A 08/26/2018   Procedure: TRANSESOPHAGEAL ECHOCARDIOGRAM (TEE);  Surgeon: Burnell Blanks, MD;  Location: [place];  Service: Open Heart Surgery;  Laterality: N/A;  . THORACOTOMY/LOBECTOMY Right 03/02/2013   Procedure: Right Video Assisted Thoracoscopy/Thoracotomy with upper Lobectomy;  Surgeon: Gaye Pollack, MD;  Location: [place];  Service: Thoracic;  Laterality: Right;  Right Lung Upper  Lobectomy   . TONSILLECTOMY  1960  . VIDEO BRONCHOSCOPY  09/12/2011   Procedure: VIDEO BRONCHOSCOPY WITH FLUORO;  Surgeon: Tanda Rockers, MD;  Location: [facility];  Service: Cardiopulmonary;  Laterality: Bilateral;    REVIEW OF SYSTEMS:  A comprehensive review of systems was negative except for: Respiratory: positive for dyspnea on exertion   PHYSICAL EXAMINATION: General appearance: alert, cooperative and no distress Head: Normocephalic, without obvious abnormality, atraumatic Neck: no adenopathy, no JVD, supple, symmetrical, trachea midline and thyroid not enlarged, symmetric, no tenderness/mass/nodules Lymph nodes: Cervical, supraclavicular, and axillary nodes  normal. Resp: clear to auscultation bilaterally Back: symmetric, no curvature. ROM normal. No CVA tenderness. Cardio: regular rate and rhythm, S1, S2 normal, no murmur, click, rub or gallop GI: soft, non-tender; bowel sounds normal; no masses,  no organomegaly Extremities: extremities normal, atraumatic, no cyanosis or edema  ECOG PERFORMANCE STATUS: 1 - Symptomatic but completely ambulatory  Blood pressure 139/63, pulse (!) 58, temperature (!) 97.3 F (36.3 C), temperature source Tympanic, resp. rate 18, height 4\' 11"  (1.499 m), weight 132 lb 11.2 oz (60.2 kg), last menstrual period 03/25/2011, SpO2 98 %.  LABORATORY DATA: Lab Results  Component Value Date   WBC 6.9 01/04/2020   HGB 12.6 01/04/2020   HCT 39.1 01/04/2020   MCV 93.3 01/04/2020   PLT 297 01/04/2020      Chemistry      Component Value Date/Time   NA 137 01/04/2020 1353   NA 136 09/03/2018 1122   NA 140 01/02/2017 0737   K 4.1 01/04/2020 1353   K 4.4 01/02/2017 0737   CL 104 01/04/2020 1353   CO2 28 01/04/2020 1353   CO2 29 01/02/2017 0737   BUN 17 01/04/2020 1353   BUN 16 09/03/2018 1122   BUN 17.2 01/02/2017 0737   CREATININE 0.91 01/04/2020 1353   CREATININE 0.7 01/02/2017 0737      Component Value Date/Time   CALCIUM 9.6 01/04/2020 1353   CALCIUM 9.7 01/02/2017 0737   ALKPHOS 77 01/04/2020 1353   ALKPHOS 82 01/02/2017 0737   AST 16 01/04/2020 1353   AST 16 01/02/2017 0737   ALT 11 01/04/2020 1353   ALT 13 01/02/2017 0737   BILITOT 0.7 01/04/2020 1353   BILITOT 0.76 01/02/2017 0737       RADIOGRAPHIC STUDIES: CT Chest W Contrast  Result Date: 01/04/2020 CLINICAL DATA:  Stage IB right upper lobe squamous cell lung cancer status post right upper lobectomy in 2014. Restaging. EXAM: CT CHEST WITH CONTRAST TECHNIQUE: Multidetector CT imaging of the chest was performed during intravenous contrast administration. CONTRAST:  57mL OMNIPAQUE IOHEXOL 300 MG/ML  SOLN COMPARISON:  01/05/2019 chest CT.  FINDINGS: Cardiovascular: Normal heart size. No significant pericardial effusion/thickening. Three-vessel coronary atherosclerosis. Aortic valve prosthesis is in place. Atherosclerotic nonaneurysmal thoracic aorta. Normal caliber pulmonary arteries. No central pulmonary emboli. Mediastinum/Nodes: No discrete thyroid nodules. Unremarkable esophagus. No pathologically enlarged axillary, mediastinal or hilar lymph nodes. Lungs/Pleura: No pneumothorax. No pleural effusion. Moderate centrilobular and paraseptal emphysema. Status post right upper lobectomy. No acute consolidative airspace disease, lung masses or significant pulmonary nodules. Mild-to-moderate patchy subpleural reticulation and ground-glass opacity throughout both lungs is not substantially changed, nonspecific, favoring chronic emphysema associated fibrosis. Upper abdomen: No acute abnormality. Musculoskeletal: No aggressive appearing focal osseous lesions. Mild thoracic spondylosis. IMPRESSION: 1. No evidence of local tumor recurrence status post right upper lobectomy. 2. No evidence of metastatic disease in the chest. 3. Three-vessel coronary atherosclerosis. 4.  Aortic Atherosclerosis (ICD10-I70.0) and Emphysema (ICD10-J43.9). Electronically Signed   By: Ilona Sorrel M.D.   On: 01/04/2020 15:14   ASSESSMENT AND PLAN: this is a very pleasant 69 years old white female with history of a stage IB non-small cell lung cancer status post right upper lobectomy with lymph node dissection. The patient has been in observation for around 7 years now. She had repeat CT scan of the chest performed recently.  I personally and independently reviewed the scans and discussed the results with the patient today. Her scan showed no concerning findings for disease recurrence or metastasis. I recommended for her to continue on observation with routine follow-up visit by her primary care physician from now on. I will see her on as-needed basis in the future. The  patient voices understanding of current disease status and treatment options and is in agreement with the current care plan.  All questions were answered. The patient knows to call the clinic with any problems, questions or concerns. We can certainly see the patient much sooner if necessary.  Disclaimer: This note was dictated with voice recognition software. Similar sounding words can inadvertently be transcribed and may not be corrected upon review.

## 2020-01-26 ENCOUNTER — Other Ambulatory Visit (HOSPITAL_COMMUNITY): Payer: Self-pay | Admitting: Internal Medicine

## 2020-01-26 ENCOUNTER — Other Ambulatory Visit: Payer: Self-pay | Admitting: Physician Assistant

## 2020-02-08 DIAGNOSIS — Z1231 Encounter for screening mammogram for malignant neoplasm of breast: Secondary | ICD-10-CM | POA: Diagnosis not present

## 2020-02-08 DIAGNOSIS — Z01419 Encounter for gynecological examination (general) (routine) without abnormal findings: Secondary | ICD-10-CM | POA: Diagnosis not present

## 2020-02-08 DIAGNOSIS — Z6826 Body mass index (BMI) 26.0-26.9, adult: Secondary | ICD-10-CM | POA: Diagnosis not present

## 2020-02-10 ENCOUNTER — Other Ambulatory Visit (HOSPITAL_COMMUNITY): Payer: Self-pay

## 2020-02-10 MED ORDER — SPIRONOLACTONE 25 MG PO TABS: 25.0000 mg | ORAL_TABLET | Freq: Every day | ORAL | 3 refills | Status: DC

## 2020-02-11 ENCOUNTER — Telehealth (HOSPITAL_COMMUNITY): Payer: Self-pay | Admitting: Pharmacist

## 2020-02-11 NOTE — Telephone Encounter (Signed)
Obtained [employer] Heart Failure grant for copay assistance with Praluent:   ID: [id number] Ages: [id number] PCN: [id number] Group: [id number] Aquilla Solian Amount: $2,200.00 Start Date: 11/10/2019 End Date: 02/06/2021  Audry Riles, PharmD, BCPS, BCCP, CPP Heart Failure Clinic Pharmacist [phone]

## 2020-02-12 ENCOUNTER — Telehealth: Payer: Self-pay | Admitting: Interventional Cardiology

## 2020-02-12 NOTE — Telephone Encounter (Signed)
*  STAT* If patient is at the pharmacy, call can be transferred to refill team.   1. Which medications need to be refilled? (please list name of each medication and dose if known) REp  2. Which pharmacy/location (including street and city if local pharmacy) is medication to be sent to[facility] [place]   3. Do they need a 30 day or 90 day supply? PT would like a year  She rec'd the great for this med for next yeare

## 2020-02-12 NOTE — Telephone Encounter (Signed)
Called pt and pt stated that her insurance does not cover praluent and that she needed to go back on repatha. Pt would like a call concerning this matter. Please address

## 2020-02-12 NOTE — Telephone Encounter (Signed)
Yes I had helped her get a PAN grant to help with the cost of the medication. PAN would help for either Praluent or Repatha. The patient informed me that her insurance is going to require a change to Repatha (I am assuming this will take place January 2022). I told her to reach out to the prescribing physician to get that changed as we do not prescribe her Praluent.

## 2020-02-22 NOTE — Telephone Encounter (Signed)
Called and spoke w/pt regarding praluent I did a pa and it said available w/out auth so I instructed the pt to contact me if further issues arise

## 2020-03-10 NOTE — Progress Notes (Signed)
PCP: Primary Cardiologist: Dr Haroldine Laws   HPI: Ms Hemmer is a 70 y/o woman year old with history of COPD, lung CA s/p RUL resection 2014, CAD (prior stenting of the LM, LAD and circumflex in the setting of dissection), AS s/p TAVR 5/18 ans systolic HF with recovered EF.   1 yr TAVR echo: Echo 7/21 EF 40-45%, normally functioning TAVR with a mean gradient of 14 mmHg and trivial-mild PVL. 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 Mild MR.  Today she returns for HF follow up. Says she is doing great. Active without CP or SOB. Walks 1 hour in her driveway or in the [employer] everyday. No CP or SOB. Rare dizziness. Takes BP every day range typically 100-120 but multiple SBPs in mid 90s. Almost never takes lasix.   Studies:  Echo 09/24/18 EF 30-35%. TAVR ok. Mild MR.   Echo 08/2018 EF 25% , severe aortic stenosis, severe mitral regurgitation.   LHC 08/19/2018 1. Triple vessel CAD.  2. Patent stents in the left main, LAD and Circumflex (placed at the time of dissection) 3. Chronic occlusion small Obtuse marginal branch which fills from right to left collaterals 4. Patent mid to distal RCA stent. The proximal RCA has a moderate non-obstructive stenosis which does not appear to be flow limiting. This is unchanged from her last cath.  5. Severe aortic stenosis (mean gradient 21 mmHg, peak to peak gradient 36 mmHg, AVA 0.71 cm2) 6. Elevated filling pressures c/w acute volume overload (RA 9/14,   ROS: All systems negative except as listed in HPI, PMH and Problem List.  SH:  Social History   Socioeconomic History  . Marital status: Married    Spouse name: Not on file  . Number of children: 1  . Years of education: 25  . Highest education level: High school graduate  Occupational History  . 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 Smoking status: Former Smoker    Packs/day: 0.50    Years: 40.00    Pack years: 20.00    Types: Cigarettes    Quit date:  10/08/2012    Years since quitting: 7.4  . Smokeless tobacco: Never Used  Vaping Use  . Vaping Use: Never used  Substance and Sexual Activity  . Alcohol use: No  . Drug use: No  . Sexual activity: Yes  Other Topics Concern  . Not on file  Social History Narrative  . Not on file   Social Determinants of Health   Financial Resource Strain: Not on file  Food Insecurity: Not on file  Transportation Needs: Not on file  Physical Activity: Not on file  Stress: Not on file  Social Connections: Not on file  Intimate Partner Violence: Not on file    FH:  Family History  Problem Relation Age of Onset  . Alcohol abuse Father   . Lupus Mother   . Other Sister        Degenerative disc disease  . Migraines Sister   . Pulmonary fibrosis Brother   . Heart attack Neg Hx     Past Medical History:  Diagnosis Date  . AAA (abdominal aortic aneurysm) ([place])    a. small by cath 2014.  [name] Acute on chronic combined systolic and diastolic heart failure ([place]) 08/19/2018  . Arthritis    "across my hips; buttocks; comes w/the weather" (02/27/2013)  . CAD (coronary artery disease), native coronary artery    a. 10/2012 - rotational atherectomy of  LAD c/b dissection s/p PCI to LAD, LCx, LM. b. 12/2012 - s/p overlapping DES to RCA.  . Cataract    "just the beginnings on the right" (02/27/2013)  . Chronic combined systolic and diastolic CHF (congestive heart failure) ([place])   . COPD (chronic obstructive pulmonary disease) ([place])   . Coronary artery disease   . GERD (gastroesophageal reflux disease)   . Hypercholesterolemia   . Ischemic cardiomyopathy    a. EF 45-50%.  . Mass of lung    "small tumor RUL; they are watching it" (02/27/2013)  . Mitral regurgitation   . Peripheral vascular disease, unspecified ([place])   . S/P TAVR (transcatheter aortic valve replacement)    23 mm Edwards Sapien 3 transcatheter heart valve placed via open left transaxillary approach   . Severe aortic stenosis   . Severe  mitral regurgitation   . Tachycardia, unspecified   . Tobacco abuse     Current Outpatient Medications  Medication Sig Dispense Refill  . albuterol (PROAIR HFA) 108 (90 Base) MCG/ACT inhaler Inhale 2 puffs into the lungs every 6 (six) hours as needed for wheezing or shortness of breath.    [name] albuterol (PROVENTIL) (2.5 MG/3ML) 0.083% nebulizer solution Take 3 mLs (2.5 mg total) by nebulization every 4 (four) hours as needed for wheezing or shortness of breath. 75 mL 12  . bisoprolol (ZEBETA) 5 MG tablet TAKE 1/2 TABLET BY MOUTH DAILY 90 tablet 3  . clopidogrel (PLAVIX) 75 MG tablet TAKE 1 TABLET BY MOUTH EVERY DAY 90 tablet 2  . Fluticasone-Umeclidin-Vilant (TRELEGY ELLIPTA) 100-62.5-25 MCG/INH AEPB Inhale 1 puff into the lungs daily. 60 each 11  . furosemide (LASIX) 40 MG tablet Take 1 tablet (40 mg total) by mouth as needed for fluid or edema.    . Hypromellose (GENTEAL MILD) 0.2 % SOLN Place 2 drops into both eyes 4 (four) times daily as needed (dry eyes).     . lansoprazole (PREVACID) 30 MG capsule Take 30 mg by mouth daily before lunch.    . losartan (COZAAR) 25 MG tablet TAKE 1 TABLET(25 MG) BY MOUTH TWICE DAILY 180 tablet 3  . NASAL SALINE NA Place 1 spray into the nose daily as needed (congestion).     . nitroGLYCERIN (NITROSTAT) 0.4 MG SL tablet PLACE 1 TABLET UNDER THE TONGUE EVERY 5 MINUTES AS NEEDED FOR CHEST PAIN 25 tablet 4  . OXYGEN 2lpm with sleep     . PRALUENT 150 MG/ML SOAJ INJECT 1 PEN INTO THE SKIN EVERY 14 (FOURTEEN) DAYS. 6 mL 3  . simethicone (MYLICON) 80 MG chewable tablet Chew 2 tablets (160 mg total) by mouth every 6 (six) hours as needed for flatulence. 30 tablet 0  . Spacer/Aero-Holding Chambers (AEROCHAMBER MV) inhaler Use as instructed 1 each 0  . Specialty Vitamins Products ([facility] MENOPAUSE SUPPORT PO) Take 1 capsule by mouth daily.    [name] spironolactone (ALDACTONE) 25 MG tablet Take 1 tablet (25 mg total) by mouth daily. 90 tablet 3   No current  facility-administered medications for this encounter.    Vitals:   03/11/20 0926  BP: 122/70  Pulse: (!) 58  SpO2: 96%  Weight: 59.4 kg (131 lb)   Wt Readings from Last 3 Encounters:  03/11/20 59.4 kg (131 lb)  01/07/20 60.2 kg (132 lb 11.2 oz)  09/03/19 58.3 kg (128 lb 9.6 oz)    PHYSICAL EXAM: General:  Well appearing. No resp difficulty HEENT: normal Neck: supple. no JVD. Carotids 2+ bilat; + bruits. No lymphadenopathy  or thryomegaly appreciated. Cor: PMI nondisplaced. Regular rate & rhythm. 2/6 SEM at RUSB S2 crisp  Lungs: clear Abdomen: soft, nontender, nondistended. No hepatosplenomegaly. No bruits or masses. Good bowel sounds. Extremities: no cyanosis, clubbing, rash, edema Neuro: alert & orientedx3, cranial nerves grossly intact. moves all 4 extremities w/o difficulty. Affect pleasant  SB 57 anterolateral Qs Personally reviewed     ASSESSMENT & PLAN: 1. Chronic Systolic HF. ICM + valvular heart disease - previous ECHO with EF 40-45%.  - 08/27/2018 ECHO EF 20-25% with severe MR - Echo 03/25/19 EF 50% Apex hypokinetic - Echo 7/21 EF 40-45%, normally functioning TAVR with a mean gradient of 14 mmHg and trivial-mild PVL. 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 Mild MR. - NYHA I-II. Volume status ok  - Continue spiro 25 mg daily  - Continue 2.5 mg bisoprolol. No room to increase with HRs in 50s.  - Continue losartan 25 mg twice a day. BP too low to switch to [employer] 10  2. Severe AS -S/P TAVR on 08/26/2018  -Continue  plavix. Off aspirin in 12/23 - Reminded of need for SBE prophylaxis  3. Severe MR -> Mild MR - Functional. - Improved with TAVR and treatment of HF  4. CAD - No s/s of ischhemia - Patent stents on LHC 6/20   5. COPD - quit smoking 2014 - followed by Dr, Melvyn Novas  6. H/O Lung Cancer - s/p resection 2014  7. Carotid bruits - radiated from AoV - carotid US 6/20 1-39% bilaterally  Glori Bickers,  MD  8:41 PM

## 2020-03-11 ENCOUNTER — Encounter (HOSPITAL_COMMUNITY): Payer: Self-pay | Admitting: Internal Medicine

## 2020-03-11 ENCOUNTER — Ambulatory Visit (HOSPITAL_COMMUNITY)
Admission: RE | Admit: 2020-03-11 | Discharge: 2020-03-11 | Disposition: A | Discharge status: Home / Self Care | Payer: PPO | Source: Ambulatory Visit | Attending: Internal Medicine | Admitting: Internal Medicine

## 2020-03-11 ENCOUNTER — Other Ambulatory Visit: Payer: Self-pay

## 2020-03-11 VITALS — BP 122/70 | HR 58 | Wt 131.0 lb

## 2020-03-11 DIAGNOSIS — I739 Peripheral vascular disease, unspecified: Secondary | ICD-10-CM | POA: Diagnosis not present

## 2020-03-11 DIAGNOSIS — I2510 Atherosclerotic heart disease of native coronary artery without angina pectoris: Secondary | ICD-10-CM

## 2020-03-11 DIAGNOSIS — Z955 Presence of coronary angioplasty implant and graft: Secondary | ICD-10-CM | POA: Diagnosis not present

## 2020-03-11 DIAGNOSIS — I714 Abdominal aortic aneurysm, without rupture: Secondary | ICD-10-CM | POA: Diagnosis not present

## 2020-03-11 DIAGNOSIS — Z836 Family history of other diseases of the respiratory system: Secondary | ICD-10-CM | POA: Diagnosis not present

## 2020-03-11 DIAGNOSIS — I080 Rheumatic disorders of both mitral and aortic valves: Secondary | ICD-10-CM | POA: Diagnosis not present

## 2020-03-11 DIAGNOSIS — Z87891 Personal history of nicotine dependence: Secondary | ICD-10-CM | POA: Insufficient documentation

## 2020-03-11 DIAGNOSIS — Z79899 Other long term (current) drug therapy: Secondary | ICD-10-CM | POA: Diagnosis not present

## 2020-03-11 DIAGNOSIS — I255 Ischemic cardiomyopathy: Secondary | ICD-10-CM | POA: Diagnosis not present

## 2020-03-11 DIAGNOSIS — I5022 Chronic systolic (congestive) heart failure: Secondary | ICD-10-CM | POA: Diagnosis not present

## 2020-03-11 DIAGNOSIS — Z820 Family history of epilepsy and other diseases of the nervous system: Secondary | ICD-10-CM | POA: Diagnosis not present

## 2020-03-11 DIAGNOSIS — Z85118 Personal history of other malignant neoplasm of bronchus and lung: Secondary | ICD-10-CM | POA: Diagnosis not present

## 2020-03-11 DIAGNOSIS — Z7951 Long term (current) use of inhaled steroids: Secondary | ICD-10-CM | POA: Insufficient documentation

## 2020-03-11 DIAGNOSIS — Z8489 Family history of other specified conditions: Secondary | ICD-10-CM | POA: Insufficient documentation

## 2020-03-11 DIAGNOSIS — Z7902 Long term (current) use of antithrombotics/antiplatelets: Secondary | ICD-10-CM | POA: Insufficient documentation

## 2020-03-11 DIAGNOSIS — Z952 Presence of prosthetic heart valve: Secondary | ICD-10-CM | POA: Diagnosis not present

## 2020-03-11 DIAGNOSIS — J449 Chronic obstructive pulmonary disease, unspecified: Secondary | ICD-10-CM | POA: Insufficient documentation

## 2020-03-11 DIAGNOSIS — I5043 Acute on chronic combined systolic (congestive) and diastolic (congestive) heart failure: Secondary | ICD-10-CM | POA: Diagnosis not present

## 2020-03-11 DIAGNOSIS — I5042 Chronic combined systolic (congestive) and diastolic (congestive) heart failure: Secondary | ICD-10-CM | POA: Diagnosis not present

## 2020-03-11 DIAGNOSIS — Z811 Family history of alcohol abuse and dependence: Secondary | ICD-10-CM | POA: Insufficient documentation

## 2020-03-11 DIAGNOSIS — R000 Tachycardia, unspecified: Secondary | ICD-10-CM | POA: Diagnosis not present

## 2020-03-11 LAB — BASIC METABOLIC PANEL
Anion gap: 11 (ref 5–15)
BUN: 14 mg/dL (ref 8–23)
CO2: 24 mmol/L (ref 22–32)
Calcium: 9.5 mg/dL (ref 8.9–10.3)
Chloride: 102 mmol/L (ref 98–111)
Creatinine, Ser: 0.84 mg/dL (ref 0.44–1.00)
GFR, Estimated: >60 mL/min (ref >60)
Glucose, Bld: 93 mg/dL (ref 70–99)
Potassium: 5.1 mmol/L (ref 3.5–5.1)
Sodium: 137 mmol/L (ref 135–145)

## 2020-03-11 LAB — BRAIN NATRIURETIC PEPTIDE: B Natriuretic Peptide: 184.7 pg/mL — ABNORMAL HIGH (ref 0.0–100.0)

## 2020-03-11 MED ORDER — EMPAGLIFLOZIN 10 MG PO TABS: 10.0000 mg | ORAL_TABLET | Freq: Every day | ORAL | 6 refills | Status: DC

## 2020-03-11 NOTE — Patient Instructions (Addendum)
Labs done today, your results will be available in MyChart, we will contact you for abnormal readings.  Start Jardiance (1 tablet) daily  Please call our office in December 2022 to schedule a follow up appointment   If you have any questions or concerns before your next appointment please send [place] a message through [place] or call our office at [phone].    TO LEAVE A MESSAGE FOR THE NURSE SELECT OPTION 2, PLEASE LEAVE A MESSAGE INCLUDING: . YOUR NAME . DATE OF BIRTH . CALL BACK NUMBER . REASON FOR CALL**this is important as we prioritize the call backs  [place] AS LONG AS YOU CALL BEFORE 4:00 PM   At the [facility], you and your health needs are our priority. As part of our continuing mission to provide you with exceptional heart care, we have created designated Provider Care Teams. These Care Teams include your primary Cardiologist (physician) and Advanced Practice Providers (APPs- Physician Assistants and Nurse Practitioners) who all work together to provide you with the care you need, when you need it.   You may see any of the following providers on your designated Care Team at your next follow up: [name] Dr Glori Bickers . Dr Loralie Champagne . Darrick Grinder, NP . Lyda Jester, PA . Audry Riles, PharmD   Please be sure to bring in all your medications bottles to every appointment.

## 2020-03-18 ENCOUNTER — Other Ambulatory Visit (HOSPITAL_COMMUNITY): Payer: Self-pay | Admitting: *Deleted

## 2020-03-18 ENCOUNTER — Telehealth (HOSPITAL_COMMUNITY): Payer: Self-pay | Admitting: *Deleted

## 2020-03-18 NOTE — Telephone Encounter (Signed)
Pt called stating since starting jardiance she feels like she has a UTI. Pt said the pain isn't terrible but noticeable. Pt asks that we send something to her pharmacy to treat a her UTI. Pt said shes only had this pain one other time in the past and it was definitely a UTI.   Routed to [place]

## 2020-03-19 DIAGNOSIS — R350 Frequency of micturition: Secondary | ICD-10-CM | POA: Diagnosis not present

## 2020-03-23 ENCOUNTER — Telehealth (HOSPITAL_COMMUNITY): Payer: Self-pay | Admitting: *Deleted

## 2020-03-23 NOTE — Telephone Encounter (Signed)
Pt went to [facility] saturday and they treated UTI with macrobid. Urinalysis was negative still waiting for culture results. Pt is no longer having symptoms.

## 2020-03-23 NOTE — Telephone Encounter (Signed)
Ran test claim for Jardiance. Teresa [name] will be $45 for 30 day supply and $90 for 90 day supply. Prior authorization is not required. It is likely the pharmacy is still billing the coupon card rather than her insurance. Patient aware and will call pharmacy to process prescription.

## 2020-03-23 NOTE — Telephone Encounter (Signed)
Has this improved or been treated?

## 2020-03-23 NOTE — Telephone Encounter (Signed)
Pt called stating her pharmacy filled her 14day supply of jardiance she only has two pills left so she called to get her 90 day supply and was told she would need Prior authorization. Pt is concerned she will run out by the weekend and ask that we submit PA.  Routed to [employer] because Jodelle Gross is out of the office

## 2020-04-08 ENCOUNTER — Other Ambulatory Visit: Payer: Self-pay | Admitting: Interventional Cardiology

## 2020-04-08 DIAGNOSIS — I25119 Atherosclerotic heart disease of native coronary artery with unspecified angina pectoris: Secondary | ICD-10-CM

## 2020-04-08 DIAGNOSIS — E785 Hyperlipidemia, unspecified: Secondary | ICD-10-CM

## 2020-04-08 DIAGNOSIS — I2510 Atherosclerotic heart disease of native coronary artery without angina pectoris: Secondary | ICD-10-CM

## 2020-05-23 ENCOUNTER — Other Ambulatory Visit: Payer: Self-pay | Admitting: Internal Medicine

## 2020-06-02 DIAGNOSIS — I509 Heart failure, unspecified: Secondary | ICD-10-CM | POA: Diagnosis not present

## 2020-06-02 DIAGNOSIS — I2510 Atherosclerotic heart disease of native coronary artery without angina pectoris: Secondary | ICD-10-CM | POA: Diagnosis not present

## 2020-06-02 DIAGNOSIS — K219 Gastro-esophageal reflux disease without esophagitis: Secondary | ICD-10-CM | POA: Diagnosis not present

## 2020-06-02 DIAGNOSIS — I10 Essential (primary) hypertension: Secondary | ICD-10-CM | POA: Diagnosis not present

## 2020-06-02 DIAGNOSIS — E785 Hyperlipidemia, unspecified: Secondary | ICD-10-CM | POA: Diagnosis not present

## 2020-06-02 DIAGNOSIS — J449 Chronic obstructive pulmonary disease, unspecified: Secondary | ICD-10-CM | POA: Diagnosis not present

## 2020-06-02 DIAGNOSIS — I5043 Acute on chronic combined systolic (congestive) and diastolic (congestive) heart failure: Secondary | ICD-10-CM | POA: Diagnosis not present

## 2020-06-20 ENCOUNTER — Other Ambulatory Visit: Payer: Self-pay | Admitting: Physician Assistant

## 2020-06-20 ENCOUNTER — Telehealth (HOSPITAL_COMMUNITY): Payer: Self-pay | Admitting: Pharmacy Technician

## 2020-06-20 ENCOUNTER — Other Ambulatory Visit (HOSPITAL_COMMUNITY): Payer: Self-pay

## 2020-06-20 NOTE — Telephone Encounter (Signed)
Patient called in and left a message this morning. She thought her Jardiance 90 day co-pay was $600, turns out its $320 for 90 days. She is in the donut hole and is aware that once that once she meets a certain amount spent OOP, she will fall into catastrophic coverage and the co-pay will drop back down.   She can pay for the medication at this time. Advised her to call and request samples if there is a time where this becomes a burden financially.   Charlann Boxer, CPhT

## 2020-06-22 NOTE — Telephone Encounter (Signed)
This is a CHF pt 

## 2020-06-23 ENCOUNTER — Telehealth (HOSPITAL_COMMUNITY): Payer: Self-pay | Admitting: Pharmacist

## 2020-06-23 MED ORDER — FUROSEMIDE 40 MG PO TABS: 40.0000 mg | ORAL_TABLET | ORAL | 3 refills | Status: DC | PRN

## 2020-06-23 NOTE — Telephone Encounter (Signed)
Furosemide refill sent to [facility] per patient request.   Audry Riles, PharmD, BCPS, BCCP, CPP Heart Failure Clinic Pharmacist [phone]

## 2020-06-28 DIAGNOSIS — I509 Heart failure, unspecified: Secondary | ICD-10-CM | POA: Diagnosis not present

## 2020-06-28 DIAGNOSIS — J449 Chronic obstructive pulmonary disease, unspecified: Secondary | ICD-10-CM | POA: Diagnosis not present

## 2020-06-28 DIAGNOSIS — K219 Gastro-esophageal reflux disease without esophagitis: Secondary | ICD-10-CM | POA: Diagnosis not present

## 2020-06-28 DIAGNOSIS — I5043 Acute on chronic combined systolic (congestive) and diastolic (congestive) heart failure: Secondary | ICD-10-CM | POA: Diagnosis not present

## 2020-06-28 DIAGNOSIS — E785 Hyperlipidemia, unspecified: Secondary | ICD-10-CM | POA: Diagnosis not present

## 2020-06-28 DIAGNOSIS — I2510 Atherosclerotic heart disease of native coronary artery without angina pectoris: Secondary | ICD-10-CM | POA: Diagnosis not present

## 2020-06-28 DIAGNOSIS — I10 Essential (primary) hypertension: Secondary | ICD-10-CM | POA: Diagnosis not present

## 2020-07-12 NOTE — Progress Notes (Signed)
PCP: Leeroy Cha, MD Primary Cardiologist: Dr Haroldine Laws   HPI: Teresa [name] is a 70 y/o woman year old with history of COPD, lung CA s/p RUL resection 2014, CAD (prior stenting of the LM, LAD and circumflex in the setting of dissection), AS s/p TAVR 8/31 ans systolic HF with recovered EF.   1 yr TAVR echo: Echo 7/21 EF 40-45%, normally functioning TAVR with a mean gradient of 14 mmHg and trivial-mild PVL. 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 Mild MR.  Presents for unscheduled visit today due to CP. Yesterday was sitting on the couch when she had squeezing sensation in her chest. Lasted 20-30 seconds then went away. Felt a bit like her heart pain from before but also had a lot of belching at the time.  Went away and didn't come back. Has noticed more SOB over past few months but has gained about 40 pounds in past 2 years. No exertional CP. Has some left thigh pain with walking but no calf claudication. SBP mostly 110-120 but occasionally can drop to 80s.    Studies:  Echo 09/24/18 EF 30-35%. TAVR ok. Mild MR.   Echo 08/2018 EF 25% , severe aortic stenosis, severe mitral regurgitation.   LHC 08/19/2018 1. Triple vessel CAD.  2. Patent stents in the left main, LAD and Circumflex (placed at the time of dissection) 3. Chronic occlusion small Obtuse marginal branch which fills from right to left collaterals 4. Patent mid to distal RCA stent. The proximal RCA has a moderate non-obstructive stenosis which does not appear to be flow limiting. This is unchanged from her last cath.  5. Severe aortic stenosis (mean gradient 21 mmHg, peak to peak gradient 36 mmHg, AVA 0.71 cm2) 6. Elevated filling pressures c/w acute volume overload (RA 9/14,   ROS: All systems negative except as listed in HPI, PMH and Problem List.  SH:  Social History   Socioeconomic History  . Marital status: Married    Spouse name: Not on file  . Number of children: 1  . Years of  education: 7  . Highest education level: High school graduate  Occupational History  . 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 Smoking status: Former Smoker    Packs/day: 0.50    Years: 40.00    Pack years: 20.00    Types: Cigarettes    Quit date: 10/08/2012    Years since quitting: 7.7  . Smokeless tobacco: Never Used  Vaping Use  . Vaping Use: Never used  Substance and Sexual Activity  . Alcohol use: No  . Drug use: No  . Sexual activity: Yes  Other Topics Concern  . Not on file  Social History Narrative  . Not on file   Social Determinants of Health   Financial Resource Strain: Not on file  Food Insecurity: Not on file  Transportation Needs: Not on file  Physical Activity: Not on file  Stress: Not on file  Social Connections: Not on file  Intimate Partner Violence: Not on file    FH:  Family History  Problem Relation Age of Onset  . Alcohol abuse Father   . Lupus Mother   . Other Sister        Degenerative disc disease  . Migraines Sister   . Pulmonary fibrosis Brother   . Heart attack Neg Hx     Past Medical History:  Diagnosis Date  . AAA (abdominal aortic aneurysm) ([place])    a. small by  cath 2014.  [name] Acute on chronic combined systolic and diastolic heart failure ([place]) 08/19/2018  . Arthritis    "across my hips; buttocks; comes w/the weather" (02/27/2013)  . CAD (coronary artery disease), native coronary artery    a. 10/2012 - rotational atherectomy of LAD c/b dissection s/p PCI to LAD, LCx, LM. b. 12/2012 - s/p overlapping DES to RCA.  . Cataract    "just the beginnings on the right" (02/27/2013)  . Chronic combined systolic and diastolic CHF (congestive heart failure) ([place])   . COPD (chronic obstructive pulmonary disease) ([place])   . Coronary artery disease   . GERD (gastroesophageal reflux disease)   . Hypercholesterolemia   . Ischemic cardiomyopathy    a. EF 45-50%.  . Mass of lung    "small tumor RUL; they are watching it" (02/27/2013)  .  Mitral regurgitation   . Peripheral vascular disease, unspecified ([place])   . S/P TAVR (transcatheter aortic valve replacement)    23 mm Edwards Sapien 3 transcatheter heart valve placed via open left transaxillary approach   . Severe aortic stenosis   . Severe mitral regurgitation   . Tachycardia, unspecified   . Tobacco abuse     Current Outpatient Medications  Medication Sig Dispense Refill  . albuterol (VENTOLIN HFA) 108 (90 Base) MCG/ACT inhaler Inhale 2 puffs into the lungs every 6 (six) hours as needed for wheezing or shortness of breath.    . bisoprolol (ZEBETA) 5 MG tablet TAKE 1/2 TABLET BY MOUTH DAILY 90 tablet 3  . clopidogrel (PLAVIX) 75 MG tablet TAKE 1 TABLET BY MOUTH EVERY DAY 90 tablet 2  . empagliflozin (JARDIANCE) 10 MG TABS tablet Take 1 tablet (10 mg total) by mouth daily before breakfast. 90 tablet 6  . Fluticasone-Umeclidin-Vilant (TRELEGY ELLIPTA) 100-62.5-25 MCG/INH AEPB Inhale 1 puff into the lungs daily. 60 each 11  . furosemide (LASIX) 40 MG tablet Take 1 tablet (40 mg total) by mouth as needed for fluid or edema. 90 tablet 3  . Hypromellose 0.2 % SOLN Place 2 drops into both eyes 4 (four) times daily as needed (dry eyes).     . lansoprazole (PREVACID) 30 MG capsule Take 30 mg by mouth daily before lunch.    . losartan (COZAAR) 25 MG tablet TAKE 1 TABLET(25 MG) BY MOUTH TWICE DAILY 180 tablet 3  . NASAL SALINE NA Place 1 spray into the nose daily as needed (congestion).     . nitroGLYCERIN (NITROSTAT) 0.4 MG SL tablet PLACE 1 TABLET UNDER THE TONGUE EVERY 5 MINUTES AS NEEDED FOR CHEST PAIN 25 tablet 4  . PRALUENT 150 MG/ML SOAJ INJECT 1 PEN INTO THE SKIN EVERY 14 (FOURTEEN) DAYS. 6 mL 3  . simethicone (MYLICON) 80 MG chewable tablet Chew 2 tablets (160 mg total) by mouth every 6 (six) hours as needed for flatulence. 30 tablet 0  . Spacer/Aero-Holding Chambers (AEROCHAMBER MV) inhaler Use as instructed 1 each 0  . Specialty Vitamins Products ([employer] MENOPAUSE SUPPORT  PO) Take 1 capsule by mouth daily.    [name] spironolactone (ALDACTONE) 25 MG tablet Take 1 tablet (25 mg total) by mouth daily. 90 tablet 3   No current facility-administered medications for this visit.    Vitals:   07/13/20 1131  BP: (!) 150/80  Pulse: 60  SpO2: 95%  Weight: 62.9 kg   Wt Readings from Last 3 Encounters:  03/11/20 59.4 kg (131 lb)  01/07/20 60.2 kg (132 lb 11.2 oz)  09/03/19 58.3 kg (128 lb 9.6 oz)  PHYSICAL EXAM: General:  Well appearing. No resp difficulty HEENT: normal Neck: supple. no JVD. Carotids 2+ bilat; no bruits. No lymphadenopathy or thryomegaly appreciated. Cor: PMI nondisplaced. Regular rate & rhythm. 2/6 SEM RUSB s2 crisp Lungs: clear Abdomen: obese soft, nontender, nondistended. No hepatosplenomegaly. No bruits or masses. Good bowel sounds. Extremities: no cyanosis, clubbing, rash, edema Neuro: alert & orientedx3, cranial nerves grossly intact. moves all 4 extremities w/o difficulty. Affect pleasant  Sinus brady 57 anteroseptal Qs Personally reviewed  REDS 38%  ASSESSMENT & PLAN: 1. Chronic Systolic HF. ICM + valvular heart disease - previous ECHO with EF 40-45%.  - 08/27/2018 ECHO EF 20-25% with severe MR - Echo 03/25/19 EF 50% Apex hypokinetic - Echo 7/21 EF 40-45%, normally functioning TAVR with a mean gradient of 14 mmHg and trivial-mild PVL. 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 Mild MR. - NYHA II in setting of weight gain  Volume status mildly elevated. REDS 38% - Take lasix 20 2x/week  - Continue spiro 25 mg daily  - Continue 2.5 mg bisoprolol. No room to increase with HRs in 50s.  - Continue losartan 25 mg twice a day. Can consider Delene Loll if she can afford  - Continue Jardiance 10  2. Severe AS - S/P TAVR on 08/26/2018  - Continue  plavix. Off aspirin in 12/23 - Reminded of need for SBE prophylaxis  3. Severe MR -> Mild MR - Functional. - Improved with TAVR and treatment of HF  4. CAD - No s/s  ischemia - Patent stents on LHC 6/20   5. COPD - quit smoking 2014 - followed by Dr, Melvyn Novas  6. H/O Lung Cancer - s/p resection 2014  7. Carotid bruits - radiated from AoV - carotid US 6/20 1-39% bilaterally  Glori Bickers, MD  10:35 PM

## 2020-07-13 ENCOUNTER — Encounter (HOSPITAL_COMMUNITY): Payer: Self-pay | Admitting: Internal Medicine

## 2020-07-13 ENCOUNTER — Telehealth (HOSPITAL_COMMUNITY): Payer: Self-pay | Admitting: Pharmacy Technician

## 2020-07-13 ENCOUNTER — Other Ambulatory Visit (HOSPITAL_COMMUNITY): Payer: Self-pay

## 2020-07-13 ENCOUNTER — Ambulatory Visit (HOSPITAL_COMMUNITY)
Admission: RE | Admit: 2020-07-13 | Discharge: 2020-07-13 | Disposition: A | Discharge status: Home / Self Care | Payer: PPO | Source: Ambulatory Visit | Attending: Internal Medicine | Admitting: Internal Medicine

## 2020-07-13 ENCOUNTER — Other Ambulatory Visit: Payer: Self-pay

## 2020-07-13 VITALS — BP 150/80 | HR 60 | Wt 138.6 lb

## 2020-07-13 DIAGNOSIS — I5042 Chronic combined systolic (congestive) and diastolic (congestive) heart failure: Secondary | ICD-10-CM | POA: Diagnosis not present

## 2020-07-13 DIAGNOSIS — Z952 Presence of prosthetic heart valve: Secondary | ICD-10-CM | POA: Insufficient documentation

## 2020-07-13 DIAGNOSIS — J449 Chronic obstructive pulmonary disease, unspecified: Secondary | ICD-10-CM | POA: Insufficient documentation

## 2020-07-13 DIAGNOSIS — Z902 Acquired absence of lung [part of]: Secondary | ICD-10-CM | POA: Insufficient documentation

## 2020-07-13 DIAGNOSIS — Z7902 Long term (current) use of antithrombotics/antiplatelets: Secondary | ICD-10-CM | POA: Diagnosis not present

## 2020-07-13 DIAGNOSIS — I10 Essential (primary) hypertension: Secondary | ICD-10-CM | POA: Diagnosis not present

## 2020-07-13 DIAGNOSIS — E7800 Pure hypercholesterolemia, unspecified: Secondary | ICD-10-CM | POA: Diagnosis not present

## 2020-07-13 DIAGNOSIS — Z7984 Long term (current) use of oral hypoglycemic drugs: Secondary | ICD-10-CM | POA: Diagnosis not present

## 2020-07-13 DIAGNOSIS — Z7951 Long term (current) use of inhaled steroids: Secondary | ICD-10-CM | POA: Diagnosis not present

## 2020-07-13 DIAGNOSIS — R0989 Other specified symptoms and signs involving the circulatory and respiratory systems: Secondary | ICD-10-CM | POA: Insufficient documentation

## 2020-07-13 DIAGNOSIS — Z85118 Personal history of other malignant neoplasm of bronchus and lung: Secondary | ICD-10-CM | POA: Diagnosis not present

## 2020-07-13 DIAGNOSIS — I255 Ischemic cardiomyopathy: Secondary | ICD-10-CM | POA: Diagnosis not present

## 2020-07-13 DIAGNOSIS — Z79899 Other long term (current) drug therapy: Secondary | ICD-10-CM | POA: Insufficient documentation

## 2020-07-13 DIAGNOSIS — I5022 Chronic systolic (congestive) heart failure: Secondary | ICD-10-CM | POA: Diagnosis not present

## 2020-07-13 DIAGNOSIS — I2510 Atherosclerotic heart disease of native coronary artery without angina pectoris: Secondary | ICD-10-CM | POA: Insufficient documentation

## 2020-07-13 DIAGNOSIS — Z87891 Personal history of nicotine dependence: Secondary | ICD-10-CM | POA: Diagnosis not present

## 2020-07-13 LAB — CBC
HCT: 43.1 % (ref 36.0–46.0)
Hemoglobin: 13.6 g/dL (ref 12.0–15.0)
MCH: 29.8 pg (ref 26.0–34.0)
MCHC: 31.6 g/dL (ref 30.0–36.0)
MCV: 94.5 fL (ref 80.0–100.0)
Platelets: 266 10*3/uL (ref 150–400)
RBC: 4.56 MIL/uL (ref 3.87–5.11)
RDW: 13.5 % (ref 11.5–15.5)
WBC: 7 10*3/uL (ref 4.0–10.5)
nRBC: 0 % (ref 0.0–0.2)

## 2020-07-13 LAB — BRAIN NATRIURETIC PEPTIDE: B Natriuretic Peptide: 295.8 pg/mL — ABNORMAL HIGH (ref 0.0–100.0)

## 2020-07-13 LAB — BASIC METABOLIC PANEL
Anion gap: 6 (ref 5–15)
BUN: 11 mg/dL (ref 8–23)
CO2: 26 mmol/L (ref 22–32)
Calcium: 8.9 mg/dL (ref 8.9–10.3)
Chloride: 105 mmol/L (ref 98–111)
Creatinine, Ser: 0.78 mg/dL (ref 0.44–1.00)
GFR, Estimated: >60 mL/min (ref >60)
Glucose, Bld: 92 mg/dL (ref 70–99)
Potassium: 4.9 mmol/L (ref 3.5–5.1)
Sodium: 137 mmol/L (ref 135–145)

## 2020-07-13 MED ORDER — FUROSEMIDE 40 MG PO TABS: 20.0000 mg | ORAL_TABLET | ORAL | 6 refills | Status: DC

## 2020-07-13 NOTE — Progress Notes (Signed)
ReDS Vest / Clip - 07/13/20 1200      ReDS Vest / Clip   Station Marker A    Ruler Value 26    ReDS Value Range Moderate volume overload    ReDS Actual Value 38

## 2020-07-13 NOTE — Patient Instructions (Addendum)
TAKE Lasix 20mg  (1/2 tab) twice weekly on Mondays and Fridays  Labs today We will only contact you if something comes back abnormal or we need to make some changes. Otherwise no news is good news!  Your physician has requested that you have an echocardiogram. Echocardiography is a painless test that uses sound waves to create images of your heart. It provides your doctor with information about the size and shape of your heart and how well your heart's chambers and valves are working. This procedure takes approximately one hour. There are no restrictions for this procedure.  Your physician recommends that you schedule a follow-up appointment in: 9 months with an echo. Please call in December to make these appointments.   Please call office at [phone] option 2 if you have any questions or concerns.   At the [facility], you and your health needs are our priority. As part of our continuing mission to provide you with exceptional heart care, we have created designated Provider Care Teams. These Care Teams include your primary Cardiologist (physician) and Advanced Practice Providers (APPs- Physician Assistants and Nurse Practitioners) who all work together to provide you with the care you need, when you need it.   You may see any of the following providers on your designated Care Team at your next follow up: [name] Dr Glori Bickers . Dr Loralie Champagne . Dr Vickki Muff . Darrick Grinder, NP . Lyda Jester, [place] . Audry Riles, PharmD   Please be sure to bring in all your medications bottles to every appointment.

## 2020-07-13 NOTE — Telephone Encounter (Signed)
Patient requested assistance for Jardiance. She looks to be over the income for [facility] assistance, but will send anyway.  Went ahead and placed the patient on the PAN wait list for the HF fund.  Will fax [employer] application once signatures are obtained.

## 2020-07-13 NOTE — Addendum Note (Signed)
Encounter addended by: Jolaine Artist, MD on: 07/13/2020 4:26 PM  Actions taken: Level of Service modified, Visit diagnoses modified

## 2020-07-20 ENCOUNTER — Telehealth (HOSPITAL_COMMUNITY): Payer: Self-pay | Admitting: Surgery

## 2020-07-20 ENCOUNTER — Encounter (HOSPITAL_COMMUNITY): Payer: Self-pay | Admitting: Surgery

## 2020-07-20 ENCOUNTER — Telehealth (HOSPITAL_COMMUNITY): Payer: Self-pay | Admitting: Pharmacy Technician

## 2020-07-20 MED ORDER — ENTRESTO 24-26 MG PO TABS
1.0000 | ORAL_TABLET | Freq: Two times a day (BID) | ORAL | 3 refills | Status: DC
Start: 2020-07-20 — End: 2021-01-24

## 2020-07-20 NOTE — Telephone Encounter (Signed)
Medication change per Dr. Haroldine Laws.  Discontinued Losartan and began Entresto after 36 hours.  Jodelle Gross to contact patient to review instructions.

## 2020-07-20 NOTE — Telephone Encounter (Signed)
Advanced Heart Failure Patient Advocate Encounter  Sent in [place] application via fax. The patient is over the income for assistance, but submitted anyway.  Sent in [employer] application via fax. The patient is being changed from Losartan 25mg  BID, to Entresto 24-26mg  BID. She should be able to qualify for [employer] assistance. Once approved, will call her to talk about 36 hour wash out period, transitioning from the Losartan to [facility].  Will follow up.

## 2020-07-22 ENCOUNTER — Other Ambulatory Visit (HOSPITAL_COMMUNITY): Payer: Self-pay

## 2020-07-23 ENCOUNTER — Other Ambulatory Visit: Payer: Self-pay | Admitting: Internal Medicine

## 2020-07-28 DIAGNOSIS — K219 Gastro-esophageal reflux disease without esophagitis: Secondary | ICD-10-CM | POA: Diagnosis not present

## 2020-07-28 DIAGNOSIS — I2510 Atherosclerotic heart disease of native coronary artery without angina pectoris: Secondary | ICD-10-CM | POA: Diagnosis not present

## 2020-07-28 DIAGNOSIS — I509 Heart failure, unspecified: Secondary | ICD-10-CM | POA: Diagnosis not present

## 2020-07-28 DIAGNOSIS — I10 Essential (primary) hypertension: Secondary | ICD-10-CM | POA: Diagnosis not present

## 2020-07-28 DIAGNOSIS — I5043 Acute on chronic combined systolic (congestive) and diastolic (congestive) heart failure: Secondary | ICD-10-CM | POA: Diagnosis not present

## 2020-07-28 DIAGNOSIS — E785 Hyperlipidemia, unspecified: Secondary | ICD-10-CM | POA: Diagnosis not present

## 2020-07-28 DIAGNOSIS — J449 Chronic obstructive pulmonary disease, unspecified: Secondary | ICD-10-CM | POA: Diagnosis not present

## 2020-08-05 ENCOUNTER — Telehealth (HOSPITAL_COMMUNITY): Payer: Self-pay | Admitting: Pharmacy Technician

## 2020-08-05 ENCOUNTER — Other Ambulatory Visit (HOSPITAL_COMMUNITY): Payer: Self-pay

## 2020-08-05 NOTE — Telephone Encounter (Addendum)
Patient Advocate Encounter   Received notification from [employer] that a PA is required in order to complete the application Request. Sent PA to [employer] for [employer].   PA submitted on CoverMyMeds Key [id number] Status is pending   Will continue to follow.  Advanced Heart Failure Patient Advocate Encounter  Prior Authorization for Teresa [name] has been approved.    PA# [id number] Effective dates: 08/05/20 through 08/05/21  PA approval sent to [employer] 6/3.

## 2020-08-16 ENCOUNTER — Telehealth (HOSPITAL_COMMUNITY): Payer: Self-pay | Admitting: Pharmacy Technician

## 2020-08-16 NOTE — Telephone Encounter (Signed)
Advanced Heart Failure Patient Advocate Encounter   Patient was approved to receive Entresto from [employer]  Patient ID: [id number] Effective dates: 08/09/20 through 03/04/21  Called and spoke with patient. Provided phone number to [employer]. Patient is going to start 36 hour Losartan wash out period once the Delene Loll is received.  Charlann Boxer, CPhT

## 2020-08-17 ENCOUNTER — Other Ambulatory Visit (HOSPITAL_COMMUNITY): Payer: Self-pay

## 2020-08-17 ENCOUNTER — Telehealth (HOSPITAL_COMMUNITY): Payer: Self-pay | Admitting: Pharmacy Technician

## 2020-08-17 NOTE — Telephone Encounter (Signed)
Advanced Heart Failure Patient Advocate Encounter  Jardiance HF PAN Grant  Member ID: [id number] Group ID: [id number] RxBin ID: [id number] PCN: [id number] Eligibility Start Date: 05/19/2020 Eligibility End Date: 08/16/2021 Assistance Amount: $1,000.00  Spoke with the patient. Emailed grant information for her to take to the pharmacy.   Charlann Boxer, CPhT

## 2020-09-08 DIAGNOSIS — J449 Chronic obstructive pulmonary disease, unspecified: Secondary | ICD-10-CM | POA: Diagnosis not present

## 2020-09-08 DIAGNOSIS — K219 Gastro-esophageal reflux disease without esophagitis: Secondary | ICD-10-CM | POA: Diagnosis not present

## 2020-09-08 DIAGNOSIS — I10 Essential (primary) hypertension: Secondary | ICD-10-CM | POA: Diagnosis not present

## 2020-09-08 DIAGNOSIS — E785 Hyperlipidemia, unspecified: Secondary | ICD-10-CM | POA: Diagnosis not present

## 2020-09-08 DIAGNOSIS — I5043 Acute on chronic combined systolic (congestive) and diastolic (congestive) heart failure: Secondary | ICD-10-CM | POA: Diagnosis not present

## 2020-09-08 DIAGNOSIS — I2510 Atherosclerotic heart disease of native coronary artery without angina pectoris: Secondary | ICD-10-CM | POA: Diagnosis not present

## 2020-09-08 DIAGNOSIS — I509 Heart failure, unspecified: Secondary | ICD-10-CM | POA: Diagnosis not present

## 2020-10-11 ENCOUNTER — Telehealth (HOSPITAL_COMMUNITY): Payer: Self-pay | Admitting: *Deleted

## 2020-10-11 DIAGNOSIS — I5042 Chronic combined systolic (congestive) and diastolic (congestive) heart failure: Secondary | ICD-10-CM

## 2020-10-11 NOTE — Telephone Encounter (Signed)
Pt aware and agreeable with plan. Lab appt scheduled.

## 2020-10-11 NOTE — Telephone Encounter (Signed)
Pt called stating she's noticed she's been more short of breath with exertion. Pt denies swelling, chest pain, and weight gain.  Pt also reports spells of lightheadedness. Pt keeps a bp log and her blood pressure is normal 106/62.  Pt just wanted to inform a provider to see if she needed to make any changes.  Routed to Allena Katz, [place]

## 2020-10-13 ENCOUNTER — Other Ambulatory Visit: Payer: Self-pay | Admitting: Internal Medicine

## 2020-10-17 ENCOUNTER — Other Ambulatory Visit: Payer: Self-pay

## 2020-10-17 ENCOUNTER — Ambulatory Visit (HOSPITAL_COMMUNITY)
Admission: RE | Admit: 2020-10-17 | Discharge: 2020-10-17 | Disposition: A | Discharge status: Home / Self Care | Payer: PPO | Source: Ambulatory Visit | Attending: Cardiology | Admitting: Cardiology

## 2020-10-17 DIAGNOSIS — I5042 Chronic combined systolic (congestive) and diastolic (congestive) heart failure: Secondary | ICD-10-CM | POA: Insufficient documentation

## 2020-10-17 LAB — BASIC METABOLIC PANEL
Anion gap: 9 (ref 5–15)
BUN: 20 mg/dL (ref 8–23)
CO2: 27 mmol/L (ref 22–32)
Calcium: 9.4 mg/dL (ref 8.9–10.3)
Chloride: 102 mmol/L (ref 98–111)
Creatinine, Ser: 1.11 mg/dL — ABNORMAL HIGH (ref 0.44–1.00)
GFR, Estimated: 54 mL/min — ABNORMAL LOW (ref >60)
Glucose, Bld: 81 mg/dL (ref 70–99)
Potassium: 4.8 mmol/L (ref 3.5–5.1)
Sodium: 138 mmol/L (ref 135–145)

## 2020-10-23 ENCOUNTER — Other Ambulatory Visit: Payer: Self-pay | Admitting: Physician Assistant

## 2020-10-25 DIAGNOSIS — H5213 Myopia, bilateral: Secondary | ICD-10-CM | POA: Diagnosis not present

## 2020-10-25 DIAGNOSIS — H2513 Age-related nuclear cataract, bilateral: Secondary | ICD-10-CM | POA: Diagnosis not present

## 2020-10-25 DIAGNOSIS — H52203 Unspecified astigmatism, bilateral: Secondary | ICD-10-CM | POA: Diagnosis not present

## 2020-10-25 DIAGNOSIS — H524 Presbyopia: Secondary | ICD-10-CM | POA: Diagnosis not present

## 2020-11-04 ENCOUNTER — Telehealth (HOSPITAL_COMMUNITY): Payer: Self-pay

## 2020-11-04 DIAGNOSIS — Z7902 Long term (current) use of antithrombotics/antiplatelets: Secondary | ICD-10-CM | POA: Diagnosis not present

## 2020-11-04 DIAGNOSIS — Z952 Presence of prosthetic heart valve: Secondary | ICD-10-CM | POA: Diagnosis not present

## 2020-11-04 DIAGNOSIS — I5022 Chronic systolic (congestive) heart failure: Secondary | ICD-10-CM

## 2020-11-04 DIAGNOSIS — I5040 Unspecified combined systolic (congestive) and diastolic (congestive) heart failure: Secondary | ICD-10-CM | POA: Diagnosis not present

## 2020-11-04 DIAGNOSIS — E261 Secondary hyperaldosteronism: Secondary | ICD-10-CM | POA: Diagnosis not present

## 2020-11-04 NOTE — Telephone Encounter (Signed)
Patient called wanting to know if she should decrease her lasix back down to 2 times a week instead of daily. Per last note(10/11/20) from [place] patient was only suppose to take lasix daily for 3 days. I advised patient to decrease back down to her regular 2 times a week and get 1 week labs to ensure that her creatine level hasn't worsened. Please advise if anything else needs to be done.   Orders Placed This Encounter  Procedures   Basic metabolic panel    Standing Status:   Future    Standing Expiration Date:   11/04/2021    Order Specific Question:   Release to patient    Answer:   Immediate

## 2020-11-11 ENCOUNTER — Ambulatory Visit (HOSPITAL_COMMUNITY)
Admission: RE | Admit: 2020-11-11 | Discharge: 2020-11-11 | Disposition: A | Discharge status: Home / Self Care | Payer: PPO | Source: Ambulatory Visit | Attending: Internal Medicine | Admitting: Internal Medicine

## 2020-11-11 ENCOUNTER — Other Ambulatory Visit: Payer: Self-pay

## 2020-11-11 DIAGNOSIS — I5022 Chronic systolic (congestive) heart failure: Secondary | ICD-10-CM | POA: Diagnosis not present

## 2020-11-11 LAB — BASIC METABOLIC PANEL
Anion gap: 11 (ref 5–15)
BUN: 23 mg/dL (ref 8–23)
CO2: 26 mmol/L (ref 22–32)
Calcium: 9.6 mg/dL (ref 8.9–10.3)
Chloride: 101 mmol/L (ref 98–111)
Creatinine, Ser: 0.99 mg/dL (ref 0.44–1.00)
GFR, Estimated: >60 mL/min (ref >60)
Glucose, Bld: 100 mg/dL — ABNORMAL HIGH (ref 70–99)
Potassium: 4.5 mmol/L (ref 3.5–5.1)
Sodium: 138 mmol/L (ref 135–145)

## 2020-11-25 DIAGNOSIS — Z1389 Encounter for screening for other disorder: Secondary | ICD-10-CM | POA: Diagnosis not present

## 2020-11-25 DIAGNOSIS — E785 Hyperlipidemia, unspecified: Secondary | ICD-10-CM | POA: Diagnosis not present

## 2020-11-25 DIAGNOSIS — J449 Chronic obstructive pulmonary disease, unspecified: Secondary | ICD-10-CM | POA: Diagnosis not present

## 2020-11-25 DIAGNOSIS — K219 Gastro-esophageal reflux disease without esophagitis: Secondary | ICD-10-CM | POA: Diagnosis not present

## 2020-11-25 DIAGNOSIS — I2510 Atherosclerotic heart disease of native coronary artery without angina pectoris: Secondary | ICD-10-CM | POA: Diagnosis not present

## 2020-11-25 DIAGNOSIS — I10 Essential (primary) hypertension: Secondary | ICD-10-CM | POA: Diagnosis not present

## 2020-11-25 DIAGNOSIS — I5042 Chronic combined systolic (congestive) and diastolic (congestive) heart failure: Secondary | ICD-10-CM | POA: Diagnosis not present

## 2020-11-25 DIAGNOSIS — Z952 Presence of prosthetic heart valve: Secondary | ICD-10-CM | POA: Diagnosis not present

## 2020-11-25 DIAGNOSIS — Z23 Encounter for immunization: Secondary | ICD-10-CM | POA: Diagnosis not present

## 2020-11-25 DIAGNOSIS — Z0000 Encounter for general adult medical examination without abnormal findings: Secondary | ICD-10-CM | POA: Diagnosis not present

## 2020-12-09 DIAGNOSIS — E785 Hyperlipidemia, unspecified: Secondary | ICD-10-CM | POA: Diagnosis not present

## 2020-12-09 DIAGNOSIS — I739 Peripheral vascular disease, unspecified: Secondary | ICD-10-CM | POA: Diagnosis not present

## 2020-12-09 DIAGNOSIS — I2510 Atherosclerotic heart disease of native coronary artery without angina pectoris: Secondary | ICD-10-CM | POA: Diagnosis not present

## 2020-12-09 DIAGNOSIS — M533 Sacrococcygeal disorders, not elsewhere classified: Secondary | ICD-10-CM | POA: Diagnosis not present

## 2020-12-16 DIAGNOSIS — I739 Peripheral vascular disease, unspecified: Secondary | ICD-10-CM | POA: Diagnosis not present

## 2020-12-16 DIAGNOSIS — E785 Hyperlipidemia, unspecified: Secondary | ICD-10-CM | POA: Diagnosis not present

## 2020-12-16 DIAGNOSIS — G720 Drug-induced myopathy: Secondary | ICD-10-CM | POA: Diagnosis not present

## 2020-12-19 DIAGNOSIS — I10 Essential (primary) hypertension: Secondary | ICD-10-CM | POA: Diagnosis not present

## 2020-12-19 DIAGNOSIS — E785 Hyperlipidemia, unspecified: Secondary | ICD-10-CM | POA: Diagnosis not present

## 2020-12-19 DIAGNOSIS — K219 Gastro-esophageal reflux disease without esophagitis: Secondary | ICD-10-CM | POA: Diagnosis not present

## 2020-12-19 DIAGNOSIS — I2510 Atherosclerotic heart disease of native coronary artery without angina pectoris: Secondary | ICD-10-CM | POA: Diagnosis not present

## 2020-12-19 DIAGNOSIS — I509 Heart failure, unspecified: Secondary | ICD-10-CM | POA: Diagnosis not present

## 2020-12-19 DIAGNOSIS — J449 Chronic obstructive pulmonary disease, unspecified: Secondary | ICD-10-CM | POA: Diagnosis not present

## 2020-12-19 DIAGNOSIS — I5043 Acute on chronic combined systolic (congestive) and diastolic (congestive) heart failure: Secondary | ICD-10-CM | POA: Diagnosis not present

## 2020-12-20 ENCOUNTER — Other Ambulatory Visit: Payer: Self-pay | Admitting: Internal Medicine

## 2020-12-20 DIAGNOSIS — I739 Peripheral vascular disease, unspecified: Secondary | ICD-10-CM

## 2020-12-23 ENCOUNTER — Ambulatory Visit
Admission: RE | Admit: 2020-12-23 | Discharge: 2020-12-23 | Disposition: A | Discharge status: Home / Self Care | Payer: PPO | Source: Ambulatory Visit | Attending: Internal Medicine | Admitting: Internal Medicine

## 2020-12-23 DIAGNOSIS — I70213 Atherosclerosis of native arteries of extremities with intermittent claudication, bilateral legs: Secondary | ICD-10-CM | POA: Diagnosis not present

## 2020-12-23 DIAGNOSIS — I739 Peripheral vascular disease, unspecified: Secondary | ICD-10-CM

## 2020-12-23 DIAGNOSIS — I745 Embolism and thrombosis of iliac artery: Secondary | ICD-10-CM | POA: Diagnosis not present

## 2021-01-03 ENCOUNTER — Telehealth (HOSPITAL_COMMUNITY): Payer: Self-pay | Admitting: Pharmacist

## 2021-01-03 NOTE — Telephone Encounter (Signed)
Patient Advocate Encounter  Was successful in securing patient an $68 grant from [employer] to provide copayment coverage for [employer].  This will keep the out of pocket expense at $0.     I have spoken with the patient.    The billing information is as follows:   Member ID: [id number] Group ID: [id number] RxBin: [id number] PCN: [id number] Dates of Eligibility: 12/04/20 through 12/03/21  Fund:  Hypercholesterolemia   Audry Riles, PharmD, BCPS, BCCP, CPP Heart Failure Clinic Pharmacist [phone]

## 2021-01-09 ENCOUNTER — Telehealth (HOSPITAL_COMMUNITY): Payer: Self-pay | Admitting: Pharmacy Technician

## 2021-01-09 NOTE — Telephone Encounter (Signed)
Opened in error

## 2021-01-20 ENCOUNTER — Other Ambulatory Visit: Payer: Self-pay | Admitting: Internal Medicine

## 2021-01-20 ENCOUNTER — Other Ambulatory Visit (HOSPITAL_COMMUNITY): Payer: Self-pay | Admitting: Internal Medicine

## 2021-01-24 ENCOUNTER — Other Ambulatory Visit (HOSPITAL_COMMUNITY): Payer: Self-pay | Admitting: *Deleted

## 2021-01-24 MED ORDER — ENTRESTO 24-26 MG PO TABS
1.0000 | ORAL_TABLET | Freq: Two times a day (BID) | ORAL | 3 refills | Status: DC
Start: 2021-01-24 — End: 2022-01-05

## 2021-01-30 NOTE — Progress Notes (Signed)
[facility] [place]  ASSESSMENT / PLAN: Teresa [name] is a 70 y.o. female with atherosclerosis of native arteries of bilateral lower extremities causing intermittent claudication (L>R).  Patient counseled patients with asymptomatic peripheral arterial disease or claudication have a 1-2% risk of developing chronic limb threatening ischemia, but a 15-30% risk of mortality in the next 5 years. Intervention should only be considered for medically optimized patients with disabling symptoms.   Recommend the following which can slow the progression of atherosclerosis and reduce the risk of major adverse cardiac / limb events:  Complete cessation from all tobacco products. Blood glucose control with goal A1c < 7%. Blood pressure control with goal blood pressure < 140/90 mmHg. Lipid reduction therapy with goal LDL-C <100 mg/dL (<70 if symptomatic from PAD).  Clopidogrel 75mg  PO QD. Antilipid therapy per cardiology service.  Daily walking to and past the point of discomfort. Patient counseled to keep a log of exercise distance.  Follow up in 6 months with repeat ABI for surveillance.   CHIEF COMPLAINT: Left leg cramping with walking  HISTORY OF PRESENT ILLNESS: Teresa [name] is a 70 y.o. female with extensive past vascular history (multiple PCI's, TAVR, congestive heart failure, etc.) who presents to clinic for evaluation of cramping discomfort in her left calf with walking.  The patient reports she can walk about the distance of 2 storefront's before severe cramping pain in her calf begins to bother her.  She has to rest for the pain to be relieved.  She has no pain at rest.  She denies any ulcers about her feet.  VASCULAR SURGICAL HISTORY: none  VASCULAR RISK FACTORS: Negative history of stroke / transient ischemic attack. Positive history of coronary artery disease. + history of PCI.  Negative history of CABG.  Negative history of diabetes mellitus.  No recent A1c  available to me. Positive history of smoking.  Not actively smoking. Positive history of hypertension.  Negative history of chronic kidney disease.  Last GFR >60.  Positive history of chronic obstructive pulmonary disease. Not on oxygen.  FUNCTIONAL STATUS: ECOG performance status: (1) Restricted in physically strenuous activity, ambulatory and able to do work of light nature Ambulatory status: Ambulatory within the community with limits  Past Medical History:  Diagnosis Date   AAA (abdominal aortic aneurysm) ([place])    a. small by cath 2014.   Acute on chronic combined systolic and diastolic heart failure ([place]) 08/19/2018   Arthritis    "across my hips; buttocks; comes w/the weather" (02/27/2013)   CAD (coronary artery disease), native coronary artery    a. 10/2012 - rotational atherectomy of LAD c/b dissection s/p PCI to LAD, LCx, LM. b. 12/2012 - s/p overlapping DES to RCA.   Cataract    "just the beginnings on the right" (02/27/2013)   Chronic combined systolic and diastolic CHF (congestive heart failure) (HCC)    COPD (chronic obstructive pulmonary disease) (HCC)    Coronary artery disease    GERD (gastroesophageal reflux disease)    Hypercholesterolemia    Ischemic cardiomyopathy    a. EF 45-50%.   Mass of lung    "small tumor RUL; they are watching it" (02/27/2013)   Mitral regurgitation    Peripheral vascular disease, unspecified (HCC)    S/P TAVR (transcatheter aortic valve replacement)    23 mm Edwards Sapien 3 transcatheter heart valve placed via open left transaxillary approach    Severe aortic stenosis    Severe mitral regurgitation    Tachycardia,  unspecified    Tobacco abuse     Past Surgical History:  Procedure Laterality Date   CARDIAC CATHETERIZATION  08/2018   CORONARY ANGIOPLASTY WITH STENT PLACEMENT  10/2012; 12/08/2012   "7 + 3" (02/27/2013)   ESOPHAGOGASTRODUODENOSCOPY (EGD) WITH PROPOFOL N/A 01/11/2015   Procedure: ESOPHAGOGASTRODUODENOSCOPY (EGD) WITH  PROPOFOL;  Surgeon: Garlan Fair, MD;  Location: [facility];  Service: Endoscopy;  Laterality: N/A;   FLEXIBLE BRONCHOSCOPY N/A 03/02/2013   Procedure: FLEXIBLE BRONCHOSCOPY;  Surgeon: Gaye Pollack, MD;  Location: [place];  Service: Thoracic;  Laterality: N/A;   FLEXIBLE SIGMOIDOSCOPY N/A 01/11/2015   Procedure: FLEXIBLE SIGMOIDOSCOPY;  Surgeon: Garlan Fair, MD;  Location: [facility];  Service: Endoscopy;  Laterality: N/A;  unable to complete colon-prep issues   GANGLION CYST EXCISION Left 1975   "wrist"   LEFT HEART CATH AND CORONARY ANGIOGRAPHY N/A 06/29/2016   Procedure: Left Heart Cath and Coronary Angiography;  Surgeon: Jettie Booze, MD;  Location: [place] [facility];  Service: Cardiovascular;  Laterality: N/A;   LEFT HEART CATH AND CORONARY ANGIOGRAPHY N/A 08/19/2018   Procedure: LEFT HEART CATH AND CORONARY ANGIOGRAPHY;  Surgeon: Burnell Blanks, MD;  Location: [place] [facility];  Service: Cardiovascular;  Laterality: N/A;   LEFT HEART CATHETERIZATION WITH CORONARY ANGIOGRAM N/A 12/08/2012   Procedure: LEFT HEART CATHETERIZATION WITH CORONARY ANGIOGRAM;  Surgeon: Jettie Booze, MD;  Location: [facility] CATH LAB;  Service: Cardiovascular;  Laterality: N/A;   MOUTH SURGERY  2010?   "for bone loss" (06/24/2012)   PERCUTANEOUS CORONARY STENT INTERVENTION (PCI-S) N/A 10/09/2012   Procedure: PERCUTANEOUS CORONARY STENT INTERVENTION (PCI-S);  Surgeon: Jettie Booze, MD;  Location: [facility] CATH LAB;  Service: Cardiovascular;  Laterality: N/A;   RIGHT HEART CATH N/A 08/19/2018   Procedure: RIGHT HEART CATH;  Surgeon: Burnell Blanks, MD;  Location: [place] [facility];  Service: Cardiovascular;  Laterality: N/A;   TEE WITHOUT CARDIOVERSION N/A 08/22/2018   Procedure: TRANSESOPHAGEAL ECHOCARDIOGRAM (TEE);  Surgeon: Pixie Casino, MD;  Location: [facility] ENDOSCOPY;  Service: Cardiovascular;  Laterality: N/A;   TEE WITHOUT CARDIOVERSION N/A 08/26/2018   Procedure:  TRANSESOPHAGEAL ECHOCARDIOGRAM (TEE);  Surgeon: Burnell Blanks, MD;  Location: [place];  Service: Open Heart Surgery;  Laterality: N/A;   THORACOTOMY/LOBECTOMY Right 03/02/2013   Procedure: Right Video Assisted Thoracoscopy/Thoracotomy with upper Lobectomy;  Surgeon: Gaye Pollack, MD;  Location: [facility] OR;  Service: Thoracic;  Laterality: Right;  Right Lung Upper  Lobectomy    TONSILLECTOMY  1960   VIDEO BRONCHOSCOPY  09/12/2011   Procedure: VIDEO BRONCHOSCOPY WITH FLUORO;  Surgeon: Tanda Rockers, MD;  Location: [facility];  Service: Cardiopulmonary;  Laterality: Bilateral;    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  Tobacco Use   Smoking status: Former    Packs/day: 0.50    Years: 40.00    Pack years: 20.00    Types: Cigarettes    Quit date:  10/08/2012    Years since quitting: 8.3   Smokeless tobacco: Never  Vaping Use   Vaping Use: Never used  Substance and Sexual Activity   Alcohol use: No   Drug use: No   Sexual activity: Yes  Other Topics Concern   Not on file  Social History Narrative   Not on file   Social Determinants of Health   Financial Resource Strain: Not on file  Food Insecurity: Not on file  Transportation Needs: Not on file  Physical Activity: Not on file  Stress: Not on file  Social Connections: Not on file  Intimate Partner Violence: Not on file    Allergies  Allergen Reactions   Azithromycin Shortness Of Breath   Ceclor [Cefaclor] Shortness Of Breath and Rash   Penicillins Other (See Comments)    Unknown from  childhood Did it involve swelling of the face/tongue/throat, SOB, or low BP? No Did it involve sudden or severe rash/hives, skin peeling, or any reaction on the inside of your mouth or nose? No Did you need to seek medical attention at a hospital or doctor's office? Yes When did it last happen? Infancy  If all above answers are "NO", may proceed with cephalosporin use.    Septra [Sulfamethoxazole-Trimethoprim] Shortness Of Breath and Rash   Doxycycline Swelling and Other (See Comments)    Redness on the face   Lipitor [Atorvastatin] Other (See Comments)    Severe muscle aches   Morphine And Related Nausea And Vomiting and Other (See Comments)    Terrible headache   Simvastatin Other (See Comments)    Severe muscle aches   Zetia [Ezetimibe] Other (See Comments)    Severe stomach pain   Adhesive [Tape] Other (See Comments)    Redness and swelling - use paper tape   Vicodin [Hydrocodone-Acetaminophen] Nausea And Vomiting    Current Outpatient Medications  Medication Sig Dispense Refill   albuterol (VENTOLIN HFA) 108 (90 Base) MCG/ACT inhaler Inhale 2 puffs into the lungs every 6 (six) hours as needed for wheezing or shortness of breath.     bisoprolol (ZEBETA) 5 MG tablet TAKE 1/2 TABLET BY MOUTH EVERY DAY 90 tablet 2   clopidogrel (PLAVIX) 75 MG tablet TAKE 1 TABLET BY MOUTH EVERY DAY 90 tablet 2   empagliflozin (JARDIANCE) 10 MG TABS tablet Take 1 tablet (10 mg total) by mouth daily before breakfast. 90 tablet 6   Fluticasone-Umeclidin-Vilant (TRELEGY ELLIPTA) 100-62.5-25 MCG/INH AEPB Inhale 1 puff into the lungs daily. 60 each 11   furosemide (LASIX) 40 MG tablet Take 0.5 tablets (20 mg total) by mouth 2 (two) times a week. Mondays and Fridays 15 tablet 6   Hypromellose 0.2 % SOLN Place 2 drops into both eyes 4 (four) times daily as needed (dry eyes).      lansoprazole (PREVACID) 30 MG capsule Take 30 mg by mouth daily before lunch.     NASAL SALINE NA Place 1 spray into the nose  daily as needed (congestion).      nitroGLYCERIN (NITROSTAT) 0.4 MG SL tablet DISSOLVE ONE TABLET UNDER TONGUE AS NEEDED FOR CHEST PAIN EVERY 5 MINUTES 25 tablet 4   PRALUENT 150 MG/ML SOAJ INJECT 1 PEN INTO THE SKIN EVERY 14 (FOURTEEN) DAYS. 6 mL 3   sacubitril-valsartan (ENTRESTO) 24-26 MG Take 1 tablet by mouth 2 (two) times daily. Please wait to begin for 36 hours after discontinuing the Losartan. 180 tablet 3   simethicone (MYLICON) 80 MG chewable tablet Chew 2 tablets (160 mg total)  by mouth every 6 (six) hours as needed for flatulence. 30 tablet 0   Spacer/Aero-Holding Chambers (AEROCHAMBER MV) inhaler Use as instructed 1 each 0   Specialty Vitamins Products ([employer] MENOPAUSE SUPPORT PO) Take 1 capsule by mouth daily.     spironolactone (ALDACTONE) 25 MG tablet TAKE 1 TABLET(25 MG) BY MOUTH DAILY 90 tablet 3   No current facility-administered medications for this visit.    REVIEW OF SYSTEMS:  [X]  denotes positive finding, [ ]  denotes negative finding Cardiac  Comments:  Chest pain or chest pressure:    Shortness of breath upon exertion:    Short of breath when lying flat:    Irregular heart rhythm:        Vascular    Pain in calf, thigh, or hip brought on by ambulation: x   Pain in feet at night that wakes you up from your sleep:     Blood clot in your veins:    Leg swelling:         Pulmonary    Oxygen at home:    Productive cough:     Wheezing:         Neurologic    Sudden weakness in arms or legs:     Sudden numbness in arms or legs:     Sudden onset of difficulty speaking or slurred speech:    Temporary loss of vision in one eye:     Problems with dizziness:         Gastrointestinal    Blood in stool:     Vomited blood:         Genitourinary    Burning when urinating:     Blood in urine:        Psychiatric    Major depression:         Hematologic    Bleeding problems:    Problems with blood clotting too easily:        Skin    Rashes or ulcers:         Constitutional    Fever or chills:      PHYSICAL EXAM Vitals:   01/31/21 0937  BP: 117/64  Pulse: 73  Resp: 20  Temp: 97.9 F (36.6 C)  SpO2: 98%  Weight: 132 lb (59.9 kg)  Height: 4\' 11"  (1.499 m)    Constitutional: well appearing. no distress. Appears well nourished.  Neurologic: CN intact. no focal findings. no sensory loss. Psychiatric:  Mood and affect symmetric and appropriate. Eyes:  No icterus. No conjunctival pallor. Ears, nose, throat:  mucous membranes moist. Midline trachea.  Cardiac: regular rate and rhythm.  Respiratory:  unlabored. Abdominal:  soft, non-tender, non-distended.  Peripheral vascular: no palpable pedal pulses. Good capillary refill. Extremity: no edema. no cyanosis. no pallor.  Skin: no gangrene. no ulceration.  Lymphatic: no Stemmer's sign. no palpable lymphadenopathy.  PERTINENT LABORATORY AND RADIOLOGIC DATA  Most recent CBC CBC Latest Ref Rng & Units 07/13/2020 01/04/2020 01/05/2019  WBC 4.0 - 10.5 K/uL 7.0 6.9 7.2  Hemoglobin 12.0 - 15.0 g/dL 13.6 12.6 11.8(L)  Hematocrit 36.0 - 46.0 % 43.1 39.1 36.8  Platelets 150 - 400 K/uL 266 297 250     Most recent CMP CMP Latest Ref Rng & Units 11/11/2020 10/17/2020 07/13/2020  Glucose 70 - 99 mg/dL 100(H) 81 92  BUN 8 - 23 mg/dL 23 20 11   Creatinine 0.44 - 1.00 mg/dL 0.99 1.11(H) 0.78  Sodium 135 - 145 mmol/L 138 138 137  Potassium 3.5 -  5.1 mmol/L 4.5 4.8 4.9  Chloride 98 - 111 mmol/L 101 102 105  CO2 22 - 32 mmol/L 26 27 26   Calcium 8.9 - 10.3 mg/dL 9.6 9.4 8.9  Total Protein 6.5 - 8.1 g/dL - - -  Total Bilirubin 0.3 - 1.2 mg/dL - - -  Alkaline Phos 38 - 126 U/L - - -  AST 15 - 41 U/L - - -  ALT 0 - 44 U/L - - -    Renal function CrCl cannot be calculated (Patient's most recent lab result is older than the maximum 21 days allowed.).  No results found for: HGBA1C  LDL Cholesterol  Date Value Ref Range Status  08/18/2015 66 <130 mg/dL Final    Comment:      Total Cholesterol/HDL  Ratio:CHD Risk                        Coronary Heart Disease Risk Table                                        Men       Women          1/2 Average Risk              3.4        3.3              Average Risk              5.0        4.4           2X Average Risk              9.6        7.1           3X Average Risk             23.4       11.0 Use the calculated Patient Ratio above and the CHD Risk table  to determine the patient's CHD Risk.      Vascular Imaging: Outside ABI L 0.74 / R 0.93  Yevonne Aline. Stanford Breed, MD [facility] Phone Number: [phone] 01/30/2021 9:13 PM  Total time spent on preparing this encounter including chart review, data review, collecting history, examining the patient, coordinating care for this new patient, 60 minutes.  Portions of this report may have been transcribed using voice recognition software.  Every effort has been made to ensure accuracy; however, inadvertent computerized transcription errors may still be present.

## 2021-01-31 ENCOUNTER — Encounter: Payer: Self-pay | Admitting: Vascular Surgery

## 2021-01-31 ENCOUNTER — Other Ambulatory Visit: Payer: Self-pay

## 2021-01-31 ENCOUNTER — Ambulatory Visit: Payer: PPO | Admitting: Vascular Surgery

## 2021-01-31 VITALS — BP 117/64 | HR 73 | Temp 97.9°F | Resp 20 | Ht 59.0 in | Wt 132.0 lb

## 2021-01-31 DIAGNOSIS — I70213 Atherosclerosis of native arteries of extremities with intermittent claudication, bilateral legs: Secondary | ICD-10-CM

## 2021-02-01 ENCOUNTER — Telehealth (HOSPITAL_COMMUNITY): Payer: Self-pay | Admitting: Pharmacy Technician

## 2021-02-01 NOTE — Telephone Encounter (Signed)
Advanced Heart Failure Patient Advocate Encounter  Sent in [employer] renewal application via fax, 64/40.  Will follow up.

## 2021-02-03 ENCOUNTER — Other Ambulatory Visit: Payer: Self-pay

## 2021-02-03 DIAGNOSIS — I70213 Atherosclerosis of native arteries of extremities with intermittent claudication, bilateral legs: Secondary | ICD-10-CM

## 2021-02-14 DIAGNOSIS — I2510 Atherosclerotic heart disease of native coronary artery without angina pectoris: Secondary | ICD-10-CM | POA: Diagnosis not present

## 2021-02-14 DIAGNOSIS — I5042 Chronic combined systolic (congestive) and diastolic (congestive) heart failure: Secondary | ICD-10-CM | POA: Diagnosis not present

## 2021-02-14 DIAGNOSIS — J449 Chronic obstructive pulmonary disease, unspecified: Secondary | ICD-10-CM | POA: Diagnosis not present

## 2021-02-14 DIAGNOSIS — E785 Hyperlipidemia, unspecified: Secondary | ICD-10-CM | POA: Diagnosis not present

## 2021-02-14 DIAGNOSIS — I10 Essential (primary) hypertension: Secondary | ICD-10-CM | POA: Diagnosis not present

## 2021-02-15 DIAGNOSIS — Z6827 Body mass index (BMI) 27.0-27.9, adult: Secondary | ICD-10-CM | POA: Diagnosis not present

## 2021-02-15 DIAGNOSIS — Z01419 Encounter for gynecological examination (general) (routine) without abnormal findings: Secondary | ICD-10-CM | POA: Diagnosis not present

## 2021-02-15 DIAGNOSIS — Z124 Encounter for screening for malignant neoplasm of cervix: Secondary | ICD-10-CM | POA: Diagnosis not present

## 2021-02-15 DIAGNOSIS — Z1231 Encounter for screening mammogram for malignant neoplasm of breast: Secondary | ICD-10-CM | POA: Diagnosis not present

## 2021-02-20 ENCOUNTER — Other Ambulatory Visit: Payer: Self-pay | Admitting: Interventional Cardiology

## 2021-02-20 DIAGNOSIS — E785 Hyperlipidemia, unspecified: Secondary | ICD-10-CM

## 2021-02-20 DIAGNOSIS — I2510 Atherosclerotic heart disease of native coronary artery without angina pectoris: Secondary | ICD-10-CM

## 2021-02-20 DIAGNOSIS — I25119 Atherosclerotic heart disease of native coronary artery with unspecified angina pectoris: Secondary | ICD-10-CM

## 2021-04-21 ENCOUNTER — Other Ambulatory Visit (HOSPITAL_COMMUNITY): Payer: Self-pay | Admitting: *Deleted

## 2021-04-21 ENCOUNTER — Ambulatory Visit (HOSPITAL_COMMUNITY)
Admission: RE | Admit: 2021-04-21 | Discharge: 2021-04-21 | Disposition: A | Discharge status: Home / Self Care | Payer: PPO | Source: Ambulatory Visit | Attending: Internal Medicine | Admitting: Internal Medicine

## 2021-04-21 ENCOUNTER — Ambulatory Visit (HOSPITAL_BASED_OUTPATIENT_CLINIC_OR_DEPARTMENT_OTHER)
Admission: RE | Admit: 2021-04-21 | Discharge: 2021-04-21 | Disposition: A | Discharge status: Home / Self Care | Payer: PPO | Source: Ambulatory Visit | Attending: Internal Medicine | Admitting: Internal Medicine

## 2021-04-21 ENCOUNTER — Other Ambulatory Visit: Payer: Self-pay

## 2021-04-21 VITALS — BP 174/72 | HR 75 | Ht 59.0 in | Wt 137.5 lb

## 2021-04-21 DIAGNOSIS — I2510 Atherosclerotic heart disease of native coronary artery without angina pectoris: Secondary | ICD-10-CM | POA: Insufficient documentation

## 2021-04-21 DIAGNOSIS — F17200 Nicotine dependence, unspecified, uncomplicated: Secondary | ICD-10-CM | POA: Insufficient documentation

## 2021-04-21 DIAGNOSIS — I5022 Chronic systolic (congestive) heart failure: Secondary | ICD-10-CM | POA: Diagnosis not present

## 2021-04-21 DIAGNOSIS — I340 Nonrheumatic mitral (valve) insufficiency: Secondary | ICD-10-CM

## 2021-04-21 DIAGNOSIS — E785 Hyperlipidemia, unspecified: Secondary | ICD-10-CM | POA: Insufficient documentation

## 2021-04-21 DIAGNOSIS — I5042 Chronic combined systolic (congestive) and diastolic (congestive) heart failure: Secondary | ICD-10-CM | POA: Diagnosis not present

## 2021-04-21 DIAGNOSIS — Z952 Presence of prosthetic heart valve: Secondary | ICD-10-CM

## 2021-04-21 DIAGNOSIS — Z953 Presence of xenogenic heart valve: Secondary | ICD-10-CM | POA: Insufficient documentation

## 2021-04-21 DIAGNOSIS — J449 Chronic obstructive pulmonary disease, unspecified: Secondary | ICD-10-CM | POA: Insufficient documentation

## 2021-04-21 DIAGNOSIS — R9431 Abnormal electrocardiogram [ECG] [EKG]: Secondary | ICD-10-CM | POA: Diagnosis not present

## 2021-04-21 LAB — ECHOCARDIOGRAM COMPLETE
AR max vel: 3.27 cm2
AV Area VTI: 3.31 cm2
AV Area mean vel: 3.38 cm2
AV Mean grad: 14.3 mmHg
AV Peak grad: 25.9 mmHg
Ao pk vel: 2.54 m/s
Area-P 1/2: 2.62 cm2
Calc EF: 57.1 %
Radius: 0.3 cm
S' Lateral: 3.1 cm
Single Plane A2C EF: 55.7 %
Single Plane A4C EF: 56.8 %

## 2021-04-21 MED ORDER — PERFLUTREN LIPID MICROSPHERE
1.0000 mL | INTRAVENOUS | Status: DC | PRN
  Administered 2021-04-21: 2 mL via INTRAVENOUS
  Filled 2021-04-21: qty 10

## 2021-04-21 MED ORDER — EMPAGLIFLOZIN 10 MG PO TABS: 10.0000 mg | ORAL_TABLET | Freq: Every day | ORAL | 6 refills | Status: DC

## 2021-04-21 NOTE — Patient Instructions (Signed)
Your physician recommends that you schedule a follow-up appointment with Dr Irish Lack at [facility] in 3-4 months, his office will call you for an appointment  Your physician recommends that you schedule a follow-up appointment with Dr Haroldine Laws in 1 year, **PLEASE CALL OUR OFFICE IN December 2023 TO SCHEDULE THIS APPOINTMENT  If you have any questions or concerns before your next appointment please send [place] a message through [place] or call our office at [phone].    TO LEAVE A MESSAGE FOR THE NURSE SELECT OPTION 2, PLEASE LEAVE A MESSAGE INCLUDING: YOUR NAME DATE OF BIRTH CALL BACK NUMBER REASON FOR CALL**this is important as we prioritize the call backs  YOU WILL RECEIVE A CALL BACK THE SAME DAY AS LONG AS YOU CALL BEFORE 4:00 PM  At the [facility], you and your health needs are our priority. As part of our continuing mission to provide you with exceptional heart care, we have created designated Provider Care Teams. These Care Teams include your primary Cardiologist (physician) and Advanced Practice Providers (APPs- Physician Assistants and Nurse Practitioners) who all work together to provide you with the care you need, when you need it.   You may see any of the following providers on your designated Care Team at your next follow up: Dr Glori Bickers Dr Haynes Kerns, NP Lyda Jester, [place] [facility] [place], [place] Audry Riles, PharmD   Please be sure to bring in all your medications bottles to every appointment.

## 2021-04-21 NOTE — Progress Notes (Signed)
°  Echocardiogram 2D Echocardiogram has been performed.  Teresa [name] 04/21/2021, 12:19 PM

## 2021-04-21 NOTE — Progress Notes (Signed)
PCP: Leeroy Cha, MD Primary Cardiologist: Dr Haroldine Laws   HPI: Ms Demmon is a 71 y/o woman year old with history of COPD, lung CA s/p RUL resection 2014, CAD (prior stenting of the LM, LAD and circumflex in the setting of dissection), AS s/p TAVR 1/74 ans systolic HF with recovered EF.    1 yr TAVR echo: Echo 7/21 EF 40-45%, normally functioning TAVR with a mean gradient of 14 mmHg and trivial-mild PVL. 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 Mild MR.  Seen in 5/22 for unscheduled visit due to CP. Had mild volume overload and given additional lasix.   Here for f/u. Denies CP or SOB. Having claudication L > R. Saw  Dr. Stanford Breed in [facility]. L ABI 0.74 R 0.93. Advised exercise, Will see again in March. BP at home has been consistently 95-100/70.  Complaint with meds. Frustrated about her weight gain.   Echo today 04/21/21 EF 50-55% apical aneurysm. Personally reviewed   Studies:   Echo 09/24/18 EF 30-35%. TAVR ok. Mild MR.   Echo 08/2018 EF 25% , severe aortic stenosis, severe mitral regurgitation.    LHC 08/19/2018 1. Triple vessel CAD.  2. Patent stents in the left main, LAD and Circumflex (placed at the time of dissection) 3. Chronic occlusion small Obtuse marginal branch which fills from right to left collaterals 4. Patent mid to distal RCA stent. The proximal RCA has a moderate non-obstructive stenosis which does not appear to be flow limiting. This is unchanged from her last cath.  5. Severe aortic stenosis (mean gradient 21 mmHg, peak to peak gradient 36 mmHg, AVA 0.71 cm2) 6. Elevated filling pressures c/w acute volume overload (RA 9/14,   ROS: All systems negative except as listed in HPI, PMH and Problem List.  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  Tobacco Use   Smoking status: Former    Packs/day: 0.50    Years: 40.00    Pack years: 20.00    Types: Cigarettes    Quit date: 10/08/2012    Years since quitting: 8.5   Smokeless tobacco: Never  Vaping Use   Vaping Use: Never used  Substance and Sexual Activity   Alcohol use: No   Drug use: No   Sexual activity: Yes  Other Topics Concern   Not on file  Social History Narrative   Not on file   Social Determinants of Health   Financial Resource Strain: Not on file  Food Insecurity: Not on file  Transportation Needs: Not on file  Physical Activity: Not on file  Stress: Not on file  Social Connections: Not on file  Intimate Partner Violence: Not on file    FH:  Family History  Problem Relation Age of Onset   Alcohol abuse Father    Lupus Mother    Other Sister        Degenerative disc disease   Migraines Sister    Pulmonary fibrosis Brother    Heart attack Neg Hx     Past Medical History:  Diagnosis Date   AAA (abdominal aortic aneurysm)    a. small by cath 2014.   Acute on chronic combined systolic and diastolic heart failure ([place]) 08/19/2018   Arthritis    "  across my hips; buttocks; comes w/the weather" (02/27/2013)   CAD (coronary artery disease), native coronary artery    a. 10/2012 - rotational atherectomy of LAD c/b dissection s/p PCI to LAD, LCx, LM. b. 12/2012 - s/p overlapping DES to RCA.   Cataract    "just the beginnings on the right" (02/27/2013)   Chronic combined systolic and diastolic CHF (congestive heart failure) (HCC)    COPD (chronic obstructive pulmonary disease) (HCC)    Coronary artery disease    GERD (gastroesophageal reflux disease)    Hypercholesterolemia    Ischemic cardiomyopathy    a. EF 45-50%.   Mass of lung    "small tumor RUL; they are watching it" (02/27/2013)   Mitral regurgitation    Peripheral vascular disease, unspecified (HCC)    S/P TAVR (transcatheter aortic valve replacement)    23 mm Edwards Sapien 3  transcatheter heart valve placed via open left transaxillary approach    Severe aortic stenosis    Severe mitral regurgitation    Tachycardia, unspecified    Tobacco abuse     Current Outpatient Medications  Medication Sig Dispense Refill   albuterol (VENTOLIN HFA) 108 (90 Base) MCG/ACT inhaler Inhale 2 puffs into the lungs every 6 (six) hours as needed for wheezing or shortness of breath.     Alirocumab (PRALUENT) 150 MG/ML SOAJ INJECT 1 PEN INTO THE SKIN EVERY 14 (FOURTEEN) DAYS. 6 mL 3   bisoprolol (ZEBETA) 5 MG tablet TAKE 1/2 TABLET BY MOUTH EVERY DAY 90 tablet 2   clopidogrel (PLAVIX) 75 MG tablet TAKE 1 TABLET BY MOUTH EVERY DAY 90 tablet 2   empagliflozin (JARDIANCE) 10 MG TABS tablet Take 1 tablet (10 mg total) by mouth daily before breakfast. 90 tablet 6   Fluticasone-Umeclidin-Vilant (TRELEGY ELLIPTA) 100-62.5-25 MCG/INH AEPB Inhale 1 puff into the lungs daily. 60 each 11   furosemide (LASIX) 40 MG tablet Take 0.5 tablets (20 mg total) by mouth 2 (two) times a week. Mondays and Fridays 15 tablet 6   Hypromellose 0.2 % SOLN Place 2 drops into both eyes 4 (four) times daily as needed (dry eyes).      lansoprazole (PREVACID) 30 MG capsule Take 30 mg by mouth daily before lunch.     NASAL SALINE NA Place 1 spray into the nose daily as needed (congestion).      nitroGLYCERIN (NITROSTAT) 0.4 MG SL tablet DISSOLVE ONE TABLET UNDER TONGUE AS NEEDED FOR CHEST PAIN EVERY 5 MINUTES 25 tablet 4   sacubitril-valsartan (ENTRESTO) 24-26 MG Take 1 tablet by mouth 2 (two) times daily. Please wait to begin for 36 hours after discontinuing the Losartan. 180 tablet 3   simethicone (MYLICON) 80 MG chewable tablet Chew 2 tablets (160 mg total) by mouth every 6 (six) hours as needed for flatulence. 30 tablet 0   Spacer/Aero-Holding Chambers (AEROCHAMBER MV) inhaler Use as instructed 1 each 0   Specialty Vitamins Products ([employer] MENOPAUSE SUPPORT PO) Take 1 capsule by mouth daily.     spironolactone  (ALDACTONE) 25 MG tablet TAKE 1 TABLET(25 MG) BY MOUTH DAILY 90 tablet 3   No current facility-administered medications for this encounter.   Facility-Administered Medications Ordered in Other Encounters  Medication Dose Route Frequency Provider Last Rate Last Admin   perflutren lipid microspheres (DEFINITY) IV suspension  1-10 mL Intravenous PRN Javares Kaufhold, Shaune Pascal, MD   2 mL at 04/21/21 1219    Vitals:   04/21/21 1133  BP: (!) 174/72  Pulse: 75  SpO2: 95%  Weight: 62.4 kg (137 lb 8 oz)  Height: 4\' 11"  (1.499 m)    Wt Readings from Last 3 Encounters:  04/21/21 62.4 kg (137 lb 8 oz)  01/31/21 59.9 kg (132 lb)  07/13/20 62.9 kg (138 lb 9.6 oz)    PHYSICAL EXAM: General:  Well appearing. No resp difficulty HEENT: normal Neck: supple. no JVD. Carotids 2+ bilat; + bruits. No lymphadenopathy or thryomegaly appreciated. Cor: PMI nondisplaced. Regular rate & rhythm. 2/6 SEM RUSB s2 crisp Lungs: clear Abdomen: soft, nontender, nondistended. No hepatosplenomegaly. No bruits or masses. Good bowel sounds. Extremities: no cyanosis, clubbing, rash, edema Neuro: alert & orientedx3, cranial nerves grossly intact. moves all 4 extremities w/o difficulty. Affect pleasant   ASSESSMENT & PLAN:  1. Chronic Systolic HF. ICM + valvular heart disease - previous ECHO with EF 40-45%.  - 08/27/2018 ECHO EF 20-25% with severe MR - Echo 03/25/19 EF 50% Apex hypokinetic - Echo 7/21 EF 40-45%, normal TAVR mean gradient 14 mmHg and trivial-mild PVL. 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 Mild MR. - Echo today 04/21/21 EF 50-55% apical aneurysm. TAVR valve (Sapien) ok Personally reviewed - NYHA II - Take lasix 20 2x/week  - Continue spiro 25 mg daily  - Continue 2.5 mg bisoprolol. No room to increase with HRs in 50s.  - Continue losartan 25 mg twice a day. BP too low for Entresto - Continue Jardiance 10   2. Severe AS - S/P TAVR on 08/26/2018  - Continue  plavix.  - Stable on  echo today - Reminded of need for SBE prophylaxis   3. Severe MR -> Mild MR - Functional. - Improved with TAVR and treatment of HF  4. CAD - No s/s ischemia - Patent stents on LHC 6/20  - FOllows with Dr. Irish Lack   5. COPD  - quit smoking 2014 - followed by Dr, Melvyn Novas   6. H/O Lung Cancer  - s/p resection 2014  7. Carotid bruits - radiated from AoV - carotid US 6/20 1-39% bilaterally  8. Hyperlipidemia - remains on Praulent - Per Dr, Irish Lack and [facility]  9. PAD with LE claudication (L>R) - followed by Dr. Stanford Breed - no rest pain   Glori Bickers, MD  12:25 PM

## 2021-04-24 ENCOUNTER — Telehealth: Payer: Self-pay | Admitting: Interventional Cardiology

## 2021-04-24 NOTE — Telephone Encounter (Signed)
Patient has appt with Dr. Irish Lack on 08/15/21 at 8am, she wants to know if she needs to fast that morning to have her cholesterol checked. She states that her cholesterol has slowly been creeping up despite still being on Praluent.

## 2021-04-24 NOTE — Telephone Encounter (Signed)
I spoke with patient and she will be fasting when she comes to appointment in June.

## 2021-04-28 NOTE — Telephone Encounter (Signed)
Advanced Heart Failure Patient Advocate Encounter   Patient was approved to receive Entresto from [employer]  Patient ID: [id number] Effective dates: 02/06/21 through 03/04/22  Last shipment sent out on 04/17/21.  Charlann Boxer, CPhT

## 2021-05-05 DIAGNOSIS — J449 Chronic obstructive pulmonary disease, unspecified: Secondary | ICD-10-CM | POA: Diagnosis not present

## 2021-05-05 DIAGNOSIS — Z7951 Long term (current) use of inhaled steroids: Secondary | ICD-10-CM | POA: Diagnosis not present

## 2021-05-05 DIAGNOSIS — Z6827 Body mass index (BMI) 27.0-27.9, adult: Secondary | ICD-10-CM | POA: Diagnosis not present

## 2021-05-05 DIAGNOSIS — Z7985 Long-term (current) use of injectable non-insulin antidiabetic drugs: Secondary | ICD-10-CM | POA: Diagnosis not present

## 2021-05-05 DIAGNOSIS — I5040 Unspecified combined systolic (congestive) and diastolic (congestive) heart failure: Secondary | ICD-10-CM | POA: Diagnosis not present

## 2021-05-05 DIAGNOSIS — D692 Other nonthrombocytopenic purpura: Secondary | ICD-10-CM | POA: Diagnosis not present

## 2021-05-16 DIAGNOSIS — E785 Hyperlipidemia, unspecified: Secondary | ICD-10-CM | POA: Diagnosis not present

## 2021-05-16 DIAGNOSIS — I5042 Chronic combined systolic (congestive) and diastolic (congestive) heart failure: Secondary | ICD-10-CM | POA: Diagnosis not present

## 2021-05-16 DIAGNOSIS — I10 Essential (primary) hypertension: Secondary | ICD-10-CM | POA: Diagnosis not present

## 2021-05-30 DIAGNOSIS — M62838 Other muscle spasm: Secondary | ICD-10-CM | POA: Diagnosis not present

## 2021-05-30 DIAGNOSIS — T753XXD Motion sickness, subsequent encounter: Secondary | ICD-10-CM | POA: Diagnosis not present

## 2021-06-02 ENCOUNTER — Other Ambulatory Visit (HOSPITAL_COMMUNITY): Payer: Self-pay | Admitting: Internal Medicine

## 2021-06-06 ENCOUNTER — Telehealth (HOSPITAL_COMMUNITY): Payer: Self-pay | Admitting: Pharmacy Technician

## 2021-06-06 NOTE — Telephone Encounter (Signed)
Advanced Heart Failure Patient Advocate Encounter ? ?Patient left a message stating that the pharmacy advised her she needed another grant or that the grant was not working. Called and provided the pharmacy with the billing information. They were out of stock on the medication so could not process it yesterday when the patient called. The medication is now in stock and grant was billed successfully. ? ?Called and left the patient a message.  ? ?Charlann Boxer, CPhT ? ?

## 2021-06-14 ENCOUNTER — Other Ambulatory Visit (HOSPITAL_COMMUNITY): Payer: Self-pay

## 2021-06-14 ENCOUNTER — Telehealth (HOSPITAL_COMMUNITY): Payer: Self-pay | Admitting: Pharmacy Technician

## 2021-06-14 NOTE — Telephone Encounter (Signed)
Advanced Heart Failure Patient Advocate Encounter ? ?Patient called and left message stating she received a message from the [employer] foundation that her grant would expire from inactivity. Called and spoke with the patient. She thought the letter was in reference to [place], it was in reference to [place]. Both medications have a grant. Jardiance through [employer] and Praluent through [employer]. Both have been filled since March. Both would expire from inactivity after 120 days. At the 90 day mark, the patient will receive a letter reminding her to have the medication filled. As long as she continues to fill the medications before the date on the letter, the grants will remain active.  ? ?Patient voices understanding. ? ?Charlann Boxer, CPhT ? ?

## 2021-07-12 ENCOUNTER — Other Ambulatory Visit: Payer: Self-pay | Admitting: *Deleted

## 2021-07-12 DIAGNOSIS — K219 Gastro-esophageal reflux disease without esophagitis: Secondary | ICD-10-CM | POA: Diagnosis not present

## 2021-07-12 DIAGNOSIS — I2510 Atherosclerotic heart disease of native coronary artery without angina pectoris: Secondary | ICD-10-CM | POA: Diagnosis not present

## 2021-07-12 DIAGNOSIS — E785 Hyperlipidemia, unspecified: Secondary | ICD-10-CM | POA: Diagnosis not present

## 2021-07-12 DIAGNOSIS — I714 Abdominal aortic aneurysm, without rupture, unspecified: Secondary | ICD-10-CM

## 2021-07-12 DIAGNOSIS — I70213 Atherosclerosis of native arteries of extremities with intermittent claudication, bilateral legs: Secondary | ICD-10-CM

## 2021-07-12 DIAGNOSIS — J449 Chronic obstructive pulmonary disease, unspecified: Secondary | ICD-10-CM | POA: Diagnosis not present

## 2021-07-12 DIAGNOSIS — I10 Essential (primary) hypertension: Secondary | ICD-10-CM | POA: Diagnosis not present

## 2021-07-12 DIAGNOSIS — I5043 Acute on chronic combined systolic (congestive) and diastolic (congestive) heart failure: Secondary | ICD-10-CM | POA: Diagnosis not present

## 2021-07-18 ENCOUNTER — Other Ambulatory Visit: Payer: Self-pay | Admitting: Cardiology

## 2021-07-31 NOTE — Progress Notes (Unsigned)
[facility] [place]  ASSESSMENT / PLAN: Teresa [name] is a 71 y.o. female with atherosclerosis of native arteries of bilateral lower extremities causing intermittent claudication (L>R).  patients with asymptomatic peripheral arterial disease or claudication have a 1-2% risk of developing chronic limb threatening ischemia, but a 15-30% risk of mortality in the next 5 years. Intervention should only be considered for medically optimized patients with disabling symptoms.   Recommend the following which can slow the progression of atherosclerosis and reduce the risk of major adverse cardiac / limb events:  Complete cessation from all tobacco products. Blood glucose control with goal A1c < 7%. Blood pressure control with goal blood pressure < 140/90 mmHg. Lipid reduction therapy with goal LDL-C <100 mg/dL (<70 if symptomatic from PAD).  Clopidogrel 75mg  PO QD. Antilipid therapy per cardiology service.  Daily walking to and past the point of discomfort. Patient counseled to keep a log of exercise distance.  The patient has deteriorated despite optimal medical therapy.  She has a known left external iliac artery stenosis on CT angiogram from 2020 during work-up for TAVR.  Duplex today suggest that the left common iliac is involved as well.  We will plan for angiogram with possible intervention via right common femoral artery approach in the near future. The patient is aware that I will not be able to do her procedure for about two weeks. She is happy to have Dr. Carlis Abbott do this for her on Thursday 6/1.  CHIEF COMPLAINT: Left leg cramping with walking  HISTORY OF PRESENT ILLNESS: Teresa [name] is a 71 y.o. female with extensive past vascular history (multiple PCI's, TAVR, congestive heart failure, etc.) who presents to clinic for evaluation of cramping discomfort in her left calf with walking.  The patient reports she can walk about the distance of 2 storefront's before  severe cramping pain in her calf begins to bother her.  She has to rest for the pain to be relieved.  She has no pain at rest.  She denies any ulcers about her feet.  08/01/21: Patient returns to clinic for reevaluation.  She reports she can get about 50 yards before she has severe cramping in her left thigh and calf.  This is interfering with her ability to care for children and take care of her IADLs.  We had a long discussion about risks, benefits, and alternatives to intervention for claudication.  She desires intervention.  VASCULAR SURGICAL HISTORY: none  VASCULAR RISK FACTORS: Negative history of stroke / transient ischemic attack. Positive history of coronary artery disease. + history of PCI.  Negative history of CABG.  Negative history of diabetes mellitus.  No recent A1c available to me. Positive history of smoking.  Not actively smoking. Positive history of hypertension.  Negative history of chronic kidney disease.  Last GFR >60.  Positive history of chronic obstructive pulmonary disease. Not on oxygen.  FUNCTIONAL STATUS: ECOG performance status: (1) Restricted in physically strenuous activity, ambulatory and able to do work of light nature Ambulatory status: Ambulatory within the community with limits  Past Medical History:  Diagnosis Date   AAA (abdominal aortic aneurysm) ([place])    a. small by cath 2014.   Acute on chronic combined systolic and diastolic heart failure ([place]) 08/19/2018   Arthritis    "across my hips; buttocks; comes w/the weather" (02/27/2013)   CAD (coronary artery disease), native coronary artery    a. 10/2012 - rotational atherectomy of LAD c/b dissection s/p PCI to LAD,  LCx, LM. b. 12/2012 - s/p overlapping DES to RCA.   Cataract    "just the beginnings on the right" (02/27/2013)   Chronic combined systolic and diastolic CHF (congestive heart failure) (HCC)    COPD (chronic obstructive pulmonary disease) (H[facility])    Coronary artery disease    GERD  (gastroesophageal reflux disease)    Hypercholesterolemia    Ischemic cardiomyopathy    a. EF 45-50%.   Mass of lung    "small tumor RUL; they are watching it" (02/27/2013)   Mitral regurgitation    Peripheral vascular disease, unspecified (HCC)    S/P TAVR (transcatheter aortic valve replacement)    23 mm Edwards Sapien 3 transcatheter heart valve placed via open left transaxillary approach    Severe aortic stenosis    Severe mitral regurgitation    Tachycardia, unspecified    Tobacco abuse     Past Surgical History:  Procedure Laterality Date   CARDIAC CATHETERIZATION  08/2018   CORONARY ANGIOPLASTY WITH STENT PLACEMENT  10/2012; 12/08/2012   "7 + 3" (02/27/2013)   ESOPHAGOGASTRODUODENOSCOPY (EGD) WITH PROPOFOL N/A 01/11/2015   Procedure: ESOPHAGOGASTRODUODENOSCOPY (EGD) WITH PROPOFOL;  Surgeon: Garlan Fair, MD;  Location: [facility];  Service: Endoscopy;  Laterality: N/A;   FLEXIBLE BRONCHOSCOPY N/A 03/02/2013   Procedure: FLEXIBLE BRONCHOSCOPY;  Surgeon: Gaye Pollack, MD;  Location: [place];  Service: Thoracic;  Laterality: N/A;   FLEXIBLE SIGMOIDOSCOPY N/A 01/11/2015   Procedure: FLEXIBLE SIGMOIDOSCOPY;  Surgeon: Garlan Fair, MD;  Location: [facility];  Service: Endoscopy;  Laterality: N/A;  unable to complete colon-prep issues   GANGLION CYST EXCISION Left 1975   "wrist"   LEFT HEART CATH AND CORONARY ANGIOGRAPHY N/A 06/29/2016   Procedure: Left Heart Cath and Coronary Angiography;  Surgeon: Jettie Booze, MD;  Location: [facility];  Service: Cardiovascular;  Laterality: N/A;   LEFT HEART CATH AND CORONARY ANGIOGRAPHY N/A 08/19/2018   Procedure: LEFT HEART CATH AND CORONARY ANGIOGRAPHY;  Surgeon: Burnell Blanks, MD;  Location: [facility];  Service: Cardiovascular;  Laterality: N/A;   LEFT HEART CATHETERIZATION WITH CORONARY ANGIOGRAM N/A 12/08/2012   Procedure: LEFT HEART CATHETERIZATION WITH CORONARY ANGIOGRAM;  Surgeon: Jettie Booze, MD;  Location: [facility] C[facility] LAB;  Service: Cardiovascular;  Laterality: N/A;   MOUTH SURGERY  2010?   "for bone loss" (06/24/2012)   PERCUTANEOUS CORONARY STENT INTERVENTION (PCI-S) N/A 10/09/2012   Procedure: PERCUTANEOUS CORONARY STENT INTERVENTION (PCI-S);  Surgeon: Jettie Booze, MD;  Location: [facility] CATH LAB;  Service: Cardiovascular;  Laterality: N/A;   RIGHT HEART CATH N/A 08/19/2018   Procedure: RIGHT HEART CATH;  Surgeon: Burnell Blanks, MD;  Location: [facility];  Service: Cardiovascular;  Laterality: N/A;   TEE WITHOUT CARDIOVERSION N/A 08/22/2018   Procedure: TRANSESOPHAGEAL ECHOCARDIOGRAM (TEE);  Surgeon: Pixie Casino, MD;  Location: [facility];  Service: Cardiovascular;  Laterality: N/A;   TEE WITHOUT CARDIOVERSION N/A 08/26/2018   Procedure: TRANSESOPHAGEAL ECHOCARDIOGRAM (TEE);  Surgeon: Burnell Blanks, MD;  Location: [place];  Service: Open Heart Surgery;  Laterality: N/A;   THORACOTOMY/LOBECTOMY Right 03/02/2013   Procedure: Right Video Assisted Thoracoscopy/Thoracotomy with upper Lobectomy;  Surgeon: Gaye Pollack, MD;  Location: [facility] OR;  Service: Thoracic;  Laterality: Right;  Right Lung Upper  Lobectomy    TONSILLECTOMY  1960   VIDEO BRONCHOSCOPY  09/12/2011   Procedure: VIDEO BRONCHOSCOPY WITH FLUORO;  Surgeon: Tanda Rockers, MD;  Location: [facility];  Service: Cardiopulmonary;  Laterality: Bilateral;    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  Tobacco Use   Smoking status: Former    Packs/day: 0.50    Years:  40.00    Pack years: 20.00    Types: Cigarettes    Quit date: 10/08/2012    Years since quitting: 8.8   Smokeless tobacco: Never  Vaping Use   Vaping Use: Never used  Substance and Sexual Activity   Alcohol use: No   Drug use: No   Sexual activity: Yes  Other Topics Concern   Not on file  Social History Narrative   Not on file   Social Determinants of Health   Financial Resource Strain: Not on file  Food Insecurity: Not on file  Transportation Needs: Not on file  Physical Activity: Not on file  Stress: Not on file  Social Connections: Not on file  Intimate Partner Violence: Not on file    Allergies  Allergen Reactions   Azithromycin Shortness Of Breath   Ceclor [Cefaclor] Shortness Of Breath and Rash   Penicillins Other (See Comments)    Unknown from childhood Did it involve swelling of the face/tongue/throat, SOB, or low BP? No Did it involve sudden or severe rash/hives, skin peeling, or any reaction on the inside of your mouth or nose? No Did you need to seek medical attention at a hospital or doctor's office? Yes When did it last happen? Infancy  If all above answers are "NO", may proceed with cephalosporin use.    Septra [Sulfamethoxazole-Trimethoprim] Shortness Of Breath and Rash   Doxycycline Swelling and Other (See Comments)    Redness on the face   Lipitor [Atorvastatin] Other (See Comments)    Severe muscle aches   Morphine And Related Nausea And Vomiting and Other (See Comments)    Terrible headache   Simvastatin Other (See Comments)    Severe muscle aches   Zetia [Ezetimibe] Other (See Comments)    Severe stomach pain   Adhesive [Tape] Other (See Comments)    Redness and swelling - use paper tape   Vicodin [Hydrocodone-Acetaminophen] Nausea And Vomiting    Current Outpatient Medications  Medication Sig Dispense Refill   albuterol (VENTOLIN HFA) 108 (90 Base) MCG/ACT inhaler Inhale 2 puffs into the lungs every 6 (six) hours as needed for wheezing or  shortness of breath.     Alirocumab (PRALUENT) 150 MG/ML SOAJ INJECT 1 PEN INTO THE SKIN EVERY 14 (FOURTEEN) DAYS. 6 mL 3   bisoprolol (ZEBETA) 5 MG tablet TAKE 1/2 TABLET BY MOUTH EVERY DAY 90 tablet 2   clopidogrel (PLAVIX) 75 MG tablet TAKE 1 TABLET BY MOUTH EVERY DAY 90 tablet 2   empagliflozin (JARDIANCE) 10 MG TABS tablet Take 1 tablet (10 mg total) by mouth daily before breakfast. 90 tablet 6   Fluticasone-Umeclidin-Vilant (TRELEGY ELLIPTA) 100-62.5-25 MCG/INH AEPB Inhale 1 puff into the lungs daily. 60 each 11   furosemide (LASIX) 40 MG tablet TAKE 1  TABLET BY MOUTH AS NEEDED FOR FLUID OR EDEMA 90 tablet 0   Hypromellose 0.2 % SOLN Place 2 drops into both eyes 4 (four) times daily as needed (dry eyes).      lansoprazole (PREVACID) 30 MG capsule Take 30 mg by mouth daily before lunch.     NASAL SALINE NA Place 1 spray into the nose daily as needed (congestion).      nitroGLYCERIN (NITROSTAT) 0.4 MG SL tablet DISSOLVE ONE TABLET UNDER TONGUE AS NEEDED FOR CHEST PAIN EVERY 5 MINUTES 25 tablet 4   sacubitril-valsartan (ENTRESTO) 24-26 MG Take 1 tablet by mouth 2 (two) times daily. Please wait to begin for 36 hours after discontinuing the Losartan. 180 tablet 3   simethicone (MYLICON) 80 MG chewable tablet Chew 2 tablets (160 mg total) by mouth every 6 (six) hours as needed for flatulence. 30 tablet 0   Spacer/Aero-Holding Chambers (AEROCHAMBER MV) inhaler Use as instructed 1 each 0   Specialty Vitamins Products ([employer] MENOPAUSE SUPPORT PO) Take 1 capsule by mouth daily.     spironolactone (ALDACTONE) 25 MG tablet TAKE 1 TABLET(25 MG) BY MOUTH DAILY 90 tablet 3   No current facility-administered medications for this visit.    PHYSICAL EXAM Vitals:   08/01/21 0943  BP: 130/63  Pulse: (!) 59  Resp: 20  Temp: 97.7 F (36.5 C)  SpO2: 97%  Weight: 133 lb (60.3 kg)  Height: 4\' 11"  (1.499 m)     Constitutional: well appearing. no distress. Appears well nourished.  Neurologic: CN intact.  no focal findings. no sensory loss. Psychiatric:  Mood and affect symmetric and appropriate. Eyes:  No icterus. No conjunctival pallor. Ears, nose, throat:  mucous membranes moist. Midline trachea.  Cardiac: regular rate and rhythm.  Respiratory:  unlabored. Abdominal:  soft, non-tender, non-distended.  Peripheral vascular: 1+ R femoral pulse. Absent L femoral pulse. No palpable pedal pulses. Good capillary refill. Extremity: no edema. no cyanosis. no pallor.  Skin: no gangrene. no ulceration.  Lymphatic: no Stemmer's sign. no palpable lymphadenopathy.  PERTINENT LABORATORY AND RADIOLOGIC DATA  Most recent CBC    Latest Ref Rng & Units 07/13/2020   12:20 PM 01/04/2020    1:53 PM 01/05/2019    9:50 AM  CBC  WBC 4.0 - 10.5 K/uL 7.0   6.9   7.2    Hemoglobin 12.0 - 15.0 g/dL 13.6   12.6   11.8    Hematocrit 36.0 - 46.0 % 43.1   39.1   36.8    Platelets 150 - 400 K/uL 266   297   250       Most recent CMP    Latest Ref Rng & Units 11/11/2020   11:47 AM 10/17/2020    8:50 AM 07/13/2020   12:20 PM  CMP  Glucose 70 - 99 mg/dL 100   81   92    BUN 8 - 23 mg/dL 23   20   11     Creatinine 0.44 - 1.00 mg/dL 0.99   1.11   0.78    Sodium 135 - 145 mmol/L 138   138   137    Potassium 3.5 - 5.1 mmol/L 4.5   4.8   4.9    Chloride 98 - 111 mmol/L 101   102   105    CO2 22 - 32 mmol/L 26   27   26     Calcium 8.9 - 10.3 mg/dL 9.6   9.4   8.9  Renal function CrCl cannot be calculated (Patient's most recent lab result is older than the maximum 21 days allowed.).  No results found for: HGBA1C  LDL Cholesterol  Date Value Ref Range Status  08/18/2015 66 <130 mg/dL Final    Comment:      Total Cholesterol/HDL Ratio:CHD Risk                        Coronary Heart Disease Risk Table                                        Men       Women          1/2 Average Risk              3.4        3.3              Average Risk              5.0        4.4           2X Average Risk              9.6         7.1           3X Average Risk             23.4       11.0 Use the calculated Patient Ratio above and the CHD Risk table  to determine the patient's CHD Risk.     Aortoiliac duplex No evidence of anuerysm LCIA chronic dissection causing stenosis  Vascular Imaging:  +-------+-----------+-----------+------------+------------+  ABI/TBIToday's ABIToday's TBIPrevious ABIPrevious TBI  +-------+-----------+-----------+------------+------------+  Right  0.86       0.72                                 +-------+-----------+-----------+------------+------------+  Left   0.55       0.30                                 +-------+-----------+-----------+------------+------------+   Prior CT angiogram from 2020 suggests left external iliac critical stenosis. Diminutive iliac arteries bilaterally necessitating transaxillary TAVR.   Yevonne Aline. Stanford Breed, MD [facility] Phone Number: [phone] 08/01/2021 10:46 AM  Total time spent on preparing this encounter including chart review, data review, collecting history, examining the patient, coordinating care for this established patient, 40 minutes.  Portions of this report may have been transcribed using voice recognition software.  Every effort has been made to ensure accuracy; however, inadvertent computerized transcription errors may still be present.

## 2021-08-01 ENCOUNTER — Other Ambulatory Visit: Payer: Self-pay

## 2021-08-01 ENCOUNTER — Ambulatory Visit (HOSPITAL_COMMUNITY)
Admission: RE | Admit: 2021-08-01 | Discharge: 2021-08-01 | Disposition: A | Discharge status: Home / Self Care | Payer: PPO | Source: Ambulatory Visit | Attending: Vascular Surgery | Admitting: Vascular Surgery

## 2021-08-01 ENCOUNTER — Ambulatory Visit (INDEPENDENT_AMBULATORY_CARE_PROVIDER_SITE_OTHER)
Admission: RE | Admit: 2021-08-01 | Discharge: 2021-08-01 | Disposition: A | Discharge status: Home / Self Care | Payer: PPO | Source: Ambulatory Visit | Attending: Vascular Surgery | Admitting: Vascular Surgery

## 2021-08-01 ENCOUNTER — Encounter: Payer: Self-pay | Admitting: Vascular Surgery

## 2021-08-01 ENCOUNTER — Ambulatory Visit: Payer: PPO | Admitting: Vascular Surgery

## 2021-08-01 VITALS — BP 130/63 | HR 59 | Temp 97.7°F | Resp 20 | Ht 59.0 in | Wt 133.0 lb

## 2021-08-01 DIAGNOSIS — I70213 Atherosclerosis of native arteries of extremities with intermittent claudication, bilateral legs: Secondary | ICD-10-CM

## 2021-08-01 DIAGNOSIS — I714 Abdominal aortic aneurysm, without rupture, unspecified: Secondary | ICD-10-CM | POA: Diagnosis not present

## 2021-08-03 ENCOUNTER — Ambulatory Visit (HOSPITAL_COMMUNITY)
Admission: RE | Admit: 2021-08-03 | Discharge: 2021-08-03 | Disposition: A | Discharge status: Home / Self Care | Payer: PPO | Source: Ambulatory Visit | Attending: Vascular Surgery | Admitting: Vascular Surgery

## 2021-08-03 ENCOUNTER — Encounter (HOSPITAL_COMMUNITY)
Admission: RE | Disposition: A | Discharge status: Home / Self Care | Payer: Self-pay | Source: Ambulatory Visit | Attending: Vascular Surgery

## 2021-08-03 ENCOUNTER — Other Ambulatory Visit: Payer: Self-pay

## 2021-08-03 DIAGNOSIS — I70213 Atherosclerosis of native arteries of extremities with intermittent claudication, bilateral legs: Secondary | ICD-10-CM | POA: Diagnosis not present

## 2021-08-03 DIAGNOSIS — I5042 Chronic combined systolic (congestive) and diastolic (congestive) heart failure: Secondary | ICD-10-CM | POA: Insufficient documentation

## 2021-08-03 DIAGNOSIS — I2510 Atherosclerotic heart disease of native coronary artery without angina pectoris: Secondary | ICD-10-CM | POA: Insufficient documentation

## 2021-08-03 DIAGNOSIS — Z7902 Long term (current) use of antithrombotics/antiplatelets: Secondary | ICD-10-CM | POA: Insufficient documentation

## 2021-08-03 DIAGNOSIS — Z87891 Personal history of nicotine dependence: Secondary | ICD-10-CM | POA: Diagnosis not present

## 2021-08-03 DIAGNOSIS — J449 Chronic obstructive pulmonary disease, unspecified: Secondary | ICD-10-CM | POA: Insufficient documentation

## 2021-08-03 DIAGNOSIS — I110 Hypertensive heart disease with heart failure: Secondary | ICD-10-CM | POA: Insufficient documentation

## 2021-08-03 DIAGNOSIS — Z952 Presence of prosthetic heart valve: Secondary | ICD-10-CM | POA: Diagnosis not present

## 2021-08-03 DIAGNOSIS — I70212 Atherosclerosis of native arteries of extremities with intermittent claudication, left leg: Secondary | ICD-10-CM | POA: Diagnosis not present

## 2021-08-03 HISTORY — PX: ABDOMINAL AORTOGRAM W/LOWER EXTREMITY: CATH118223

## 2021-08-03 LAB — POCT I-STAT, CHEM 8
BUN: 21 mg/dL (ref 8–23)
Calcium, Ion: 1.24 mmol/L (ref 1.15–1.40)
Chloride: 102 mmol/L (ref 98–111)
Creatinine, Ser: 1.1 mg/dL — ABNORMAL HIGH (ref 0.44–1.00)
Glucose, Bld: 100 mg/dL — ABNORMAL HIGH (ref 70–99)
HCT: 45 % (ref 36.0–46.0)
Hemoglobin: 15.3 g/dL — ABNORMAL HIGH (ref 12.0–15.0)
Potassium: 5.5 mmol/L — ABNORMAL HIGH (ref 3.5–5.1)
Sodium: 140 mmol/L (ref 135–145)
TCO2: 30 mmol/L (ref 22–32)

## 2021-08-03 SURGERY — ABDOMINAL AORTOGRAM W/LOWER EXTREMITY
Anesthesia: LOCAL

## 2021-08-03 MED ORDER — HYDRALAZINE HCL 20 MG/ML IJ SOLN: 5.0000 mg | INTRAMUSCULAR | Status: DC | PRN

## 2021-08-03 MED ORDER — FENTANYL CITRATE (PF) 100 MCG/2ML IJ SOLN
INTRAMUSCULAR | Status: DC | PRN
  Administered 2021-08-03: 25 ug via INTRAVENOUS

## 2021-08-03 MED ORDER — SODIUM CHLORIDE 0.9 % IV SOLN: 250.0000 mL | INTRAVENOUS | Status: DC | PRN

## 2021-08-03 MED ORDER — SODIUM CHLORIDE 0.9% FLUSH: 3.0000 mL | INTRAVENOUS | Status: DC | PRN

## 2021-08-03 MED ORDER — SODIUM CHLORIDE 0.9 % IV SOLN
INTRAVENOUS | Status: DC
Start: 2021-08-03 — End: 2021-08-03

## 2021-08-03 MED ORDER — MIDAZOLAM HCL 2 MG/2ML IJ SOLN
INTRAMUSCULAR | Status: DC | PRN
  Administered 2021-08-03: 1 mg via INTRAVENOUS

## 2021-08-03 MED ORDER — LABETALOL HCL 5 MG/ML IV SOLN: 10.0000 mg | INTRAVENOUS | Status: DC | PRN

## 2021-08-03 MED ORDER — LIDOCAINE HCL (PF) 1 % IJ SOLN
INTRAMUSCULAR | Status: DC | PRN
  Administered 2021-08-03: 12 mL

## 2021-08-03 MED ORDER — IODIXANOL 320 MG/ML IV SOLN
INTRAVENOUS | Status: DC | PRN
  Administered 2021-08-03: 97 mL

## 2021-08-03 MED ORDER — ONDANSETRON HCL 4 MG/2ML IJ SOLN: 4.0000 mg | Freq: Four times a day (QID) | INTRAMUSCULAR | Status: DC | PRN

## 2021-08-03 MED ORDER — HEPARIN (PORCINE) IN NACL 1000-0.9 UT/500ML-% IV SOLN
INTRAVENOUS | Status: DC | PRN
  Administered 2021-08-03 (×2): 500 mL

## 2021-08-03 MED ORDER — SODIUM CHLORIDE 0.9 % IV SOLN: INTRAVENOUS | Status: DC

## 2021-08-03 MED ORDER — SODIUM CHLORIDE 0.9% FLUSH: 3.0000 mL | Freq: Two times a day (BID) | INTRAVENOUS | Status: DC

## 2021-08-03 SURGICAL SUPPLY — 10 items
CATH ANGIO 5F BER2 65CM (CATHETERS) ×1 IMPLANT
CATH OMNI FLUSH 5F 65CM (CATHETERS) ×1 IMPLANT
KIT MICROPUNCTURE NIT STIFF (SHEATH) ×1 IMPLANT
KIT PV (KITS) ×3 IMPLANT
SHEATH PINNACLE 5F 10CM (SHEATH) ×1 IMPLANT
SHEATH PROBE COVER 6X72 (BAG) ×1 IMPLANT
SYR MEDRAD MARK V 150ML (SYRINGE) ×1 IMPLANT
TRANSDUCER W/STOPCOCK (MISCELLANEOUS) ×3 IMPLANT
TRAY PV CATH (CUSTOM PROCEDURE TRAY) ×3 IMPLANT
WIRE BENTSON .035X145CM (WIRE) ×1 IMPLANT

## 2021-08-03 NOTE — Op Note (Signed)
    Patient name: Teresa [name] MRN: [id number] DOB: [birth year] Sex: female  08/03/2021 Pre-operative Diagnosis: Lifestyle limiting short distance claudication left lower extremity Post-operative diagnosis:  Same Surgeon:  Marty Heck, MD Procedure Performed: 1.  Ultrasound-guided access right common femoral artery 2.  Aortogram with catheter selection of aorta 3.  Bilateral lower extremity arteriogram with runoff 4.  33 minutes of monitored moderate conscious sedation time  Indications: 71 year old female followed by Dr. Stanford Breed for left lower extremity claudication.  She recently had progression to lifestyle limiting claudication with evidence of high-grade left iliac stenosis by duplex.  She presents today for possible intervention after risks benefits discussed.  Findings:   Aortogram showed patent renal arteries bilaterally.  There is a small abdominal aortic aneurysm.  Her distal abdominal aorta is severely diseased with an apparent greater than 50% stenosis.  Bilateral common iliac arteries have evidence of high-grade stenosis >80%.  Bilateral hypogastrics are patent.  Bilateral external iliac arteries are patent.  Her right common femoral artery has a high-grade stenosis greater than 80%.  Bilateral lower extremity runoff shows patent SFA, popliteal, and three-vessel runoff.   Procedure:  The patient was identified in the holding area and taken to room 8.  The patient was then placed supine on the table and prepped and draped in the usual sterile fashion.  A time out was called.  Patient received Versed and fentanyl for moderate sedation.  Vital signs were monitored including respiratory rate, heart rate, blood pressure and oxygenation.  I was present for all of moderate sedation.  Ultrasound was used to evaluate the right common femoral artery.  This was severely diseased but  patent .  A digital ultrasound image was acquired.  A micropuncture needle was used to access the  right common femoral artery under ultrasound guidance more proximally above her diseased segment.  An 018 wire was advanced without resistance and a micropuncture sheath was placed.  The 018 wire was removed and a benson wire was placed.  The micropuncture sheath was exchanged for a 5 french sheath.  An omniflush catheter was advanced over the wire to the level of L-1.  An abdominal angiogram was obtained.  Next the catheter was pulled down and bilateral lower extremity runoff was obtained.  I reviewed images and discussed with Dr. Stanford Breed and we both felt she needs further work-up for aortobifemoral bypass given the complexity of her disease in the distal abdominal aorta, bilateral common iliac arteries, and common femoral disease.  Wires and catheters were removed.  Taken to holding to have the sheath removed from the right groin.  Plan: Patient has multilevel disease including the distal abdominal aorta, bilateral common iliac arteries, as well as the right common femoral artery.  Patient will need evaluation for aortobifemoral bypass and if not a candidate would likely need CERAB with right femoral endarterectomy.  Will order CTA abdomen and pelvis and cardiology clearance and follow-up with Dr. Stanford Breed.   Marty Heck, MD [facility] [place] Office: [phone]

## 2021-08-03 NOTE — H&P (Signed)
History and Physical Interval Note:  08/03/2021 9:50 AM  Teresa [name]  has presented today for surgery, with the diagnosis of atherosclerosis of native arteries of both legs with intermittent claudication.  The various methods of treatment have been discussed with the patient and family. After consideration of risks, benefits and other options for treatment, the patient has consented to  Procedure(s): ABDOMINAL AORTOGRAM W/LOWER EXTREMITY (N/A) as a surgical intervention.  The patient's history has been reviewed, patient examined, no change in status, stable for surgery.  I have reviewed the patient's chart and labs.  Questions were answered to the patient's satisfaction.     Marty Heck  [facility] [place]   ASSESSMENT / PLAN: Teresa [name] is a 71 y.o. female with atherosclerosis of native arteries of bilateral lower extremities causing intermittent claudication (L>R).   patients with asymptomatic peripheral arterial disease or claudication have a 1-2% risk of developing chronic limb threatening ischemia, but a 15-30% risk of mortality in the next 5 years. Intervention should only be considered for medically optimized patients with disabling symptoms.    Recommend the following which can slow the progression of atherosclerosis and reduce the risk of major adverse cardiac / limb events:  Complete cessation from all tobacco products. Blood glucose control with goal A1c < 7%. Blood pressure control with goal blood pressure < 140/90 mmHg. Lipid reduction therapy with goal LDL-C <100 mg/dL (<70 if symptomatic from PAD).  Clopidogrel 75mg  PO QD. Antilipid therapy per cardiology service.  Daily walking to and past the point of discomfort. Patient counseled to keep a log of exercise distance.   The patient has deteriorated despite optimal medical therapy.  She has a known left external iliac artery stenosis on CT angiogram from 2020 during work-up for TAVR.   Duplex today suggest that the left common iliac is involved as well.  We will plan for angiogram with possible intervention via right common femoral artery approach in the near future. The patient is aware that I will not be able to do her procedure for about two weeks. She is happy to have Dr. Carlis Abbott do this for her on Thursday 6/1.   CHIEF COMPLAINT: Left leg cramping with walking   HISTORY OF PRESENT ILLNESS: Teresa [name] is a 71 y.o. female with extensive past vascular history (multiple PCI's, TAVR, congestive heart failure, etc.) who presents to clinic for evaluation of cramping discomfort in her left calf with walking.  The patient reports she can walk about the distance of 2 storefront's before severe cramping pain in her calf begins to bother her.  She has to rest for the pain to be relieved.  She has no pain at rest.  She denies any ulcers about her feet.   08/01/21: Patient returns to clinic for reevaluation.  She reports she can get about 50 yards before she has severe cramping in her left thigh and calf.  This is interfering with her ability to care for children and take care of her IADLs.  We had a long discussion about risks, benefits, and alternatives to intervention for claudication.  She desires intervention.   VASCULAR SURGICAL HISTORY: none   VASCULAR RISK FACTORS: Negative history of stroke / transient ischemic attack. Positive history of coronary artery disease. + history of PCI.  Negative history of CABG.  Negative history of diabetes mellitus.  No recent A1c available to me. Positive history of smoking.  Not actively smoking. Positive history of hypertension.  Negative  history of chronic kidney disease.  Last GFR >60.  Positive history of chronic obstructive pulmonary disease. Not on oxygen.   FUNCTIONAL STATUS: ECOG performance status: (1) Restricted in physically strenuous activity, ambulatory and able to do work of light nature Ambulatory status: Ambulatory within  the community with limits       Past Medical History:  Diagnosis Date   AAA (abdominal aortic aneurysm) ([place])      a. small by cath 2014.   Acute on chronic combined systolic and diastolic heart failure ([place]) 08/19/2018   Arthritis      "across my hips; buttocks; comes w/the weather" (02/27/2013)   CAD (coronary artery disease), native coronary artery      a. 10/2012 - rotational atherectomy of LAD c/b dissection s/p PCI to LAD, LCx, LM. b. 12/2012 - s/p overlapping DES to RCA.   Cataract      "just the beginnings on the right" (02/27/2013)   Chronic combined systolic and diastolic CHF (congestive heart failure) (HCC)     COPD (chronic obstructive pulmonary disease) (HCC)     Coronary artery disease     GERD (gastroesophageal reflux disease)     Hypercholesterolemia     Ischemic cardiomyopathy      a. EF 45-50%.   Mass of lung      "small tumor RUL; they are watching it" (02/27/2013)   Mitral regurgitation     Peripheral vascular disease, unspecified (HCC)     S/P TAVR (transcatheter aortic valve replacement)      23 mm Edwards Sapien 3 transcatheter heart valve placed via open left transaxillary approach    Severe aortic stenosis     Severe mitral regurgitation     Tachycardia, unspecified     Tobacco abuse             Past Surgical History:  Procedure Laterality Date   CARDIAC CATHETERIZATION   08/2018   CORONARY ANGIOPLASTY WITH STENT PLACEMENT   10/2012; 12/08/2012    "7 + 3" (02/27/2013)   ESOPHAGOGASTRODUODENOSCOPY (EGD) WITH PROPOFOL N/A 01/11/2015    Procedure: ESOPHAGOGASTRODUODENOSCOPY (EGD) WITH PROPOFOL;  Surgeon: Garlan Fair, MD;  Location: [facility];  Service: Endoscopy;  Laterality: N/A;   FLEXIBLE BRONCHOSCOPY N/A 03/02/2013    Procedure: FLEXIBLE BRONCHOSCOPY;  Surgeon: Gaye Pollack, MD;  Location: [facility];  Service: Thoracic;  Laterality: N/A;   FLEXIBLE SIGMOIDOSCOPY N/A 01/11/2015    Procedure: FLEXIBLE SIGMOIDOSCOPY;  Surgeon: Garlan Fair, MD;   Location: [facility];  Service: Endoscopy;  Laterality: N/A;  unable to complete colon-prep issues   GANGLION CYST EXCISION Left 1975    "wrist"   LEFT HEART CATH AND CORONARY ANGIOGRAPHY N/A 06/29/2016    Procedure: Left Heart Cath and Coronary Angiography;  Surgeon: Jettie Booze, MD;  Location: [place] [facility];  Service: Cardiovascular;  Laterality: N/A;   LEFT HEART CATH AND CORONARY ANGIOGRAPHY N/A 08/19/2018    Procedure: LEFT HEART CATH AND CORONARY ANGIOGRAPHY;  Surgeon: Burnell Blanks, MD;  Location: [place] [facility];  Service: Cardiovascular;  Laterality: N/A;   LEFT HEART CATHETERIZATION WITH CORONARY ANGIOGRAM N/A 12/08/2012    Procedure: LEFT HEART CATHETERIZATION WITH CORONARY ANGIOGRAM;  Surgeon: Jettie Booze, MD;  Location: [facility] CATH LAB;  Service: Cardiovascular;  Laterality: N/A;   MOUTH SURGERY   2010?    "for bone loss" (06/24/2012)   PERCUTANEOUS CORONARY STENT INTERVENTION (PCI-S) N/A 10/09/2012    Procedure: PERCUTANEOUS CORONARY STENT INTERVENTION (PCI-S);  Surgeon: Conception Oms  Hassell Done, MD;  Location: [facility] C[facility] LAB;  Service: Cardiovascular;  Laterality: N/A;   RIGHT HEART CATH N/A 08/19/2018    Procedure: RIGHT HEART CATH;  Surgeon: Burnell Blanks, MD;  Location: [place] [facility];  Service: Cardiovascular;  Laterality: N/A;   TEE WITHOUT CARDIOVERSION N/A 08/22/2018    Procedure: TRANSESOPHAGEAL ECHOCARDIOGRAM (TEE);  Surgeon: Pixie Casino, MD;  Location: [facility] ENDOSCOPY;  Service: Cardiovascular;  Laterality: N/A;   TEE WITHOUT CARDIOVERSION N/A 08/26/2018    Procedure: TRANSESOPHAGEAL ECHOCARDIOGRAM (TEE);  Surgeon: Burnell Blanks, MD;  Location: [place];  Service: Open Heart Surgery;  Laterality: N/A;   THORACOTOMY/LOBECTOMY Right 03/02/2013    Procedure: Right Video Assisted Thoracoscopy/Thoracotomy with upper Lobectomy;  Surgeon: Gaye Pollack, MD;  Location: [facility] OR;  Service: Thoracic;  Laterality: Right;  Right Lung Upper   Lobectomy     TONSILLECTOMY   1960   VIDEO BRONCHOSCOPY   09/12/2011    Procedure: VIDEO BRONCHOSCOPY WITH FLUORO;  Surgeon: Tanda Rockers, MD;  Location: [facility];  Service: Cardiopulmonary;  Laterality: Bilateral;           Family History  Problem Relation Age of Onset   Alcohol abuse Father     Lupus Mother     Other Sister          Degenerative disc disease   Migraines Sister     Pulmonary fibrosis Brother     Heart attack Neg Hx        Social History         Socioeconomic History   Marital status: Married      Spouse name: Not on file   Number of children: 1   Years of education: 12   Highest education level: High school graduate  Occupational History   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  Tobacco Use   Smoking status: Former      Packs/day: 0.50      Years: 40.00      Pack years: 20.00      Types: Cigarettes      Quit date: 10/08/2012      Years since quitting: 8.8   Smokeless tobacco: Never  Vaping Use   Vaping Use: Never used  Substance and Sexual Activity   Alcohol use: No   Drug use: No   Sexual activity: Yes  Other Topics Concern   Not on file  Social History Narrative   Not on file    Social Determinants of Health    Financial Resource Strain: Not on file  Food Insecurity: Not on file  Transportation Needs: Not on file  Physical Activity: Not on file  Stress: Not on file  Social Connections: Not on file  Intimate Partner Violence: Not on file           Allergies  Allergen Reactions   Azithromycin Shortness Of Breath   Ceclor [Cefaclor] Shortness Of Breath and Rash   Penicillins Other (See Comments)      Unknown from childhood Did it involve swelling of the face/tongue/throat, SOB, or low BP? No Did it involve sudden or severe rash/hives, skin peeling, or any reaction on the inside of your mouth or nose? No Did you need to seek medical attention at a hospital or doctor's office? Yes When did it last happen? Infancy  If all above answers  are "NO", may proceed with cephalosporin use.     Septra [Sulfamethoxazole-Trimethoprim] Shortness Of Breath and Rash   Doxycycline Swelling and Other (See  Comments)      Redness on the face   Lipitor [Atorvastatin] Other (See Comments)      Severe muscle aches   Morphine And Related Nausea And Vomiting and Other (See Comments)      Terrible headache   Simvastatin Other (See Comments)      Severe muscle aches   Zetia [Ezetimibe] Other (See Comments)      Severe stomach pain   Adhesive [Tape] Other (See Comments)      Redness and swelling - use paper tape   Vicodin [Hydrocodone-Acetaminophen] Nausea And Vomiting            Current Outpatient Medications  Medication Sig Dispense Refill   albuterol (VENTOLIN HFA) 108 (90 Base) MCG/ACT inhaler Inhale 2 puffs into the lungs every 6 (six) hours as needed for wheezing or shortness of breath.       Alirocumab (PRALUENT) 150 MG/ML SOAJ INJECT 1 PEN INTO THE SKIN EVERY 14 (FOURTEEN) DAYS. 6 mL 3   bisoprolol (ZEBETA) 5 MG tablet TAKE 1/2 TABLET BY MOUTH EVERY DAY 90 tablet 2   clopidogrel (PLAVIX) 75 MG tablet TAKE 1 TABLET BY MOUTH EVERY DAY 90 tablet 2   empagliflozin (JARDIANCE) 10 MG TABS tablet Take 1 tablet (10 mg total) by mouth daily before breakfast. 90 tablet 6   Fluticasone-Umeclidin-Vilant (TRELEGY ELLIPTA) 100-62.5-25 MCG/INH AEPB Inhale 1 puff into the lungs daily. 60 each 11   furosemide (LASIX) 40 MG tablet TAKE 1 TABLET BY MOUTH AS NEEDED FOR FLUID OR EDEMA 90 tablet 0   Hypromellose 0.2 % SOLN Place 2 drops into both eyes 4 (four) times daily as needed (dry eyes).        lansoprazole (PREVACID) 30 MG capsule Take 30 mg by mouth daily before lunch.       NASAL SALINE NA Place 1 spray into the nose daily as needed (congestion).        nitroGLYCERIN (NITROSTAT) 0.4 MG SL tablet DISSOLVE ONE TABLET UNDER TONGUE AS NEEDED FOR CHEST PAIN EVERY 5 MINUTES 25 tablet 4   sacubitril-valsartan (ENTRESTO) 24-26 MG Take 1 tablet by mouth 2  (two) times daily. Please wait to begin for 36 hours after discontinuing the Losartan. 180 tablet 3   simethicone (MYLICON) 80 MG chewable tablet Chew 2 tablets (160 mg total) by mouth every 6 (six) hours as needed for flatulence. 30 tablet 0   Spacer/Aero-Holding Chambers (AEROCHAMBER MV) inhaler Use as instructed 1 each 0   Specialty Vitamins Products ([employer] MENOPAUSE SUPPORT PO) Take 1 capsule by mouth daily.       spironolactone (ALDACTONE) 25 MG tablet TAKE 1 TABLET(25 MG) BY MOUTH DAILY 90 tablet 3    No current facility-administered medications for this visit.      PHYSICAL EXAM    Vitals:    08/01/21 0943  BP: 130/63  Pulse: (!) 59  Resp: 20  Temp: 97.7 F (36.5 C)  SpO2: 97%  Weight: 133 lb (60.3 kg)  Height: 4\' 11"  (1.499 m)        Constitutional: well appearing. no distress. Appears well nourished.  Neurologic: CN intact. no focal findings. no sensory loss. Psychiatric:  Mood and affect symmetric and appropriate. Eyes:  No icterus. No conjunctival pallor. Ears, nose, throat:  mucous membranes moist. Midline trachea.  Cardiac: regular rate and rhythm.  Respiratory:  unlabored. Abdominal:  soft, non-tender, non-distended.  Peripheral vascular: 1+ R femoral pulse. Absent L femoral pulse. No palpable pedal pulses. Good capillary refill. Extremity: no  edema. no cyanosis. no pallor.  Skin: no gangrene. no ulceration.  Lymphatic: no Stemmer's sign. no palpable lymphadenopathy.   PERTINENT LABORATORY AND RADIOLOGIC DATA   Most recent CBC     Latest Ref Rng & Units 07/13/2020   12:20 PM 01/04/2020    1:53 PM 01/05/2019    9:50 AM  CBC  WBC 4.0 - 10.5 K/uL 7.0   6.9   7.2    Hemoglobin 12.0 - 15.0 g/dL 13.6   12.6   11.8    Hematocrit 36.0 - 46.0 % 43.1   39.1   36.8    Platelets 150 - 400 K/uL 266   297   250        Most recent CMP     Latest Ref Rng & Units 11/11/2020   11:47 AM 10/17/2020    8:50 AM 07/13/2020   12:20 PM  CMP  Glucose 70 - 99 mg/dL 100   81   92     BUN 8 - 23 mg/dL 23   20   11     Creatinine 0.44 - 1.00 mg/dL 0.99   1.11   0.78    Sodium 135 - 145 mmol/L 138   138   137    Potassium 3.5 - 5.1 mmol/L 4.5   4.8   4.9    Chloride 98 - 111 mmol/L 101   102   105    CO2 22 - 32 mmol/L 26   27   26     Calcium 8.9 - 10.3 mg/dL 9.6   9.4   8.9        Renal function CrCl cannot be calculated (Patient's most recent lab result is older than the maximum 21 days allowed.).   Last Labs  No results found for: HGBA1C     Last Labs         LDL Cholesterol  Date Value Ref Range Status  08/18/2015 66 <130 mg/dL Final      Comment:        Total Cholesterol/HDL Ratio:CHD Risk                        Coronary Heart Disease Risk Table                                        Men       Women          1/2 Average Risk              3.4        3.3              Average Risk              5.0        4.4           2X Average Risk              9.6        7.1           3X Average Risk             23.4       11.0 Use the calculated Patient Ratio above and the CHD Risk table  to determine the patient's CHD Risk.        Aortoiliac duplex No evidence of anuerysm LCIA  chronic dissection causing stenosis   Vascular Imaging:  +-------+-----------+-----------+------------+------------+  ABI/TBIToday's ABIToday's TBIPrevious ABIPrevious TBI  +-------+-----------+-----------+------------+------------+  Right  0.86       0.72                                 +-------+-----------+-----------+------------+------------+  Left   0.55       0.30                                 +-------+-----------+-----------+------------+------------+    Prior CT angiogram from 2020 suggests left external iliac critical stenosis. Diminutive iliac arteries bilaterally necessitating transaxillary TAVR.    Yevonne Aline. Stanford Breed, MD [facility] Phone Number: [phone] 08/01/2021 10:46 AM

## 2021-08-03 NOTE — Progress Notes (Signed)
Site area- right  Site Prior to Removal- 0   Pressure Applied For-  20 MInutes   Bedrest Beginning at - 1205   Manual- Yes   Patient Status During Pull- Stable    Post Pull Groin Site- 0   Post Pull Instructions Given- Yes   Post Pull Pulses Present- Yes    Dressing Applied- Tegaderm and Gauze Dressing    Comments:

## 2021-08-04 ENCOUNTER — Encounter (HOSPITAL_COMMUNITY): Payer: Self-pay | Admitting: Vascular Surgery

## 2021-08-07 DIAGNOSIS — I5043 Acute on chronic combined systolic (congestive) and diastolic (congestive) heart failure: Secondary | ICD-10-CM | POA: Diagnosis not present

## 2021-08-07 DIAGNOSIS — E785 Hyperlipidemia, unspecified: Secondary | ICD-10-CM | POA: Diagnosis not present

## 2021-08-07 DIAGNOSIS — I10 Essential (primary) hypertension: Secondary | ICD-10-CM | POA: Diagnosis not present

## 2021-08-09 ENCOUNTER — Telehealth: Payer: Self-pay | Admitting: Interventional Cardiology

## 2021-08-09 NOTE — Telephone Encounter (Signed)
   Pre-operative Risk Assessment    Patient Name: Teresa [name]  DOB: November 28, 1950 MRN: [id number]      Request for Surgical Clearance    Procedure:   Aortobifemoral Bypass  Date of Surgery:  Clearance TBD                                 Surgeon:  Dr. Stanford Breed Surgeon's Group or Practice Name:  [facility] Phone number:  [phone] Fax number:  [phone]   Type of Clearance Requested:   - Medical    Type of Anesthesia:   Choice   Additional requests/questions:  Please advise surgeon/provider what medications should be held.  Signed, Belisicia T Harris   08/09/2021, 9:41 AM

## 2021-08-10 NOTE — Telephone Encounter (Signed)
   Name: Teresa [name]  DOB: [birth year]  MRN: [id number]  Primary Cardiologist: Larae Grooms, MD  Chart reviewed as part of pre-operative protocol coverage. Because of [name]'s past medical history and time since last visit, she will require a follow-up in-office visit in order to better assess preoperative cardiovascular risk.  She followed by Dr. Haroldine Laws, however Dr. Haroldine Laws recently recommended with Dr. Irish Lack who patient has not seen in several years.  Pre-op covering staff: - Please schedule appointment and call patient to inform them. If patient already had an upcoming appointment within acceptable timeframe, please add "pre-op clearance" to the appointment notes so provider is aware. - Please contact requesting surgeon's office via preferred method (i.e, phone, fax) to inform them of need for appointment prior to surgery.  Lenna Sciara, NP  08/10/2021, 10:46 AM

## 2021-08-11 NOTE — Telephone Encounter (Signed)
Left message for the pt to call the office to schedule a sooner appt for pre op clearance.

## 2021-08-14 ENCOUNTER — Telehealth: Payer: Self-pay | Admitting: Interventional Cardiology

## 2021-08-14 NOTE — Telephone Encounter (Signed)
See clearance notes

## 2021-08-14 NOTE — Telephone Encounter (Signed)
Patient called and said that she received a phone call from Teresa [name] to schedule earlier appt frrom 09/06/21. Did not see available times before 09/06/21.

## 2021-08-15 ENCOUNTER — Ambulatory Visit: Payer: PPO | Admitting: Interventional Cardiology

## 2021-08-17 ENCOUNTER — Other Ambulatory Visit: Payer: Self-pay

## 2021-08-17 DIAGNOSIS — I70213 Atherosclerosis of native arteries of extremities with intermittent claudication, bilateral legs: Secondary | ICD-10-CM

## 2021-08-18 NOTE — Telephone Encounter (Signed)
I tried to reach the pt just to see if she wanted to try and mover her appt sooner. If not that is fine, she is scheduled with Dr. Irish Lack 09/06/21

## 2021-08-21 ENCOUNTER — Telehealth: Payer: Self-pay | Admitting: Interventional Cardiology

## 2021-08-21 NOTE — Telephone Encounter (Signed)
See clearance notes for further info

## 2021-08-21 NOTE — Telephone Encounter (Signed)
Pt returning nurses call regarding Pre Op appt. Please advise

## 2021-08-21 NOTE — Telephone Encounter (Signed)
I s/w the pt this morning and she tells me that she was away on a [place] cruise and just got back. Pt states she is fine with her appt 09/06/21 with Dr. Irish Lack who will address for pre op clearance at the yrly f/u. I assured the pt that I will send FYI to Dr. Stanford Breed that she has appt 09/06/21. Once Dr. Irish Lack clears the pt, he will have his nurse fax over his ov note giving clearance and any medication recommendations. Pt thanked me for all of my help today.

## 2021-08-22 ENCOUNTER — Ambulatory Visit: Payer: PPO | Admitting: Vascular Surgery

## 2021-08-22 ENCOUNTER — Ambulatory Visit: Payer: PPO | Admitting: Interventional Cardiology

## 2021-08-29 ENCOUNTER — Ambulatory Visit: Payer: PPO | Admitting: Vascular Surgery

## 2021-08-29 ENCOUNTER — Ambulatory Visit (HOSPITAL_COMMUNITY)
Admission: RE | Admit: 2021-08-29 | Discharge: 2021-08-29 | Disposition: A | Discharge status: Home / Self Care | Payer: PPO | Source: Ambulatory Visit | Attending: Vascular Surgery | Admitting: Vascular Surgery

## 2021-08-29 DIAGNOSIS — I701 Atherosclerosis of renal artery: Secondary | ICD-10-CM | POA: Diagnosis not present

## 2021-08-29 DIAGNOSIS — I7772 Dissection of iliac artery: Secondary | ICD-10-CM | POA: Diagnosis not present

## 2021-08-29 DIAGNOSIS — I70213 Atherosclerosis of native arteries of extremities with intermittent claudication, bilateral legs: Secondary | ICD-10-CM | POA: Diagnosis not present

## 2021-08-29 DIAGNOSIS — K5730 Diverticulosis of large intestine without perforation or abscess without bleeding: Secondary | ICD-10-CM | POA: Diagnosis not present

## 2021-08-29 DIAGNOSIS — N261 Atrophy of kidney (terminal): Secondary | ICD-10-CM | POA: Diagnosis not present

## 2021-08-29 MED ORDER — IOHEXOL 350 MG/ML SOLN
100.0000 mL | Freq: Once | INTRAVENOUS | Status: AC | PRN
  Administered 2021-08-29: 100 mL via INTRAVENOUS

## 2021-09-05 NOTE — Progress Notes (Unsigned)
Cardiology Office Note   Date:  09/06/2021   ID:  Teresa [name], Teresa [name] 17-Dec-1950, MRN [id number]  PCP:  Leeroy Cha, MD    No chief complaint on file.  CAD  Wt Readings from Last 3 Encounters:  09/06/21 132 lb (59.9 kg)  08/03/21 133 lb (60.3 kg)  08/01/21 133 lb (60.3 kg)       History of Present Illness: Teresa [name] is a 71 y.o. female  with history of COPD, lung CA s/p RUL resection 2014, CAD (prior stenting of the LM, LAD and circumflex in the setting of dissection), AS s/p TAVR 5/78 and systolic HF with recovered EF.   Last cath: "Teresa [name] 08/19/2018 1. Triple vessel CAD.  2. Patent stents in the left main, LAD and Circumflex (placed at the time of dissection) 3. Chronic occlusion small Obtuse marginal branch which fills from right to left collaterals 4. Patent mid to distal RCA stent. The proximal RCA has a moderate non-obstructive stenosis which does not appear to be flow limiting. This is unchanged from her last cath.  5. Severe aortic stenosis (mean gradient 21 mmHg, peak to peak gradient 36 mmHg, AVA 0.71 cm2) 6. Elevated filling pressures c/w acute volume overload"   Seen in 5/22 by CHF due to CP. Had mild volume overload and given additional lasix.    Noted claudication L > R. Saw  Dr. Stanford Breed in [facility]. L ABI 0.74 R 0.93. Advised exercise, Will see again in March.  Complaint with meds. Frustrated about her weight gain.    Echo 04/21/21 EF 50-55% apical aneurysm.  Denies : Chest pain. Dizziness. Leg edema. Nitroglycerin use. Orthopnea. Palpitations. Paroxysmal nocturnal dyspnea. Shortness of breath. Syncope.    Walking  a lot prior to the last year.  No cardiac sx.  Now limited by leg pain- claudication. May need vascular surgery.    Past Medical History:  Diagnosis Date   AAA (abdominal aortic aneurysm) ([place])    a. small by cath 2014.   Acute on chronic combined systolic and diastolic heart failure (Teresa [name]) 08/19/2018   Arthritis    "across my  hips; buttocks; comes w/the weather" (02/27/2013)   CAD (coronary artery disease), native coronary artery    a. 10/2012 - rotational atherectomy of LAD c/b dissection s/p PCI to LAD, LCx, LM. b. 12/2012 - s/p overlapping DES to RCA.   Cataract    "just the beginnings on the right" (02/27/2013)   Chronic combined systolic and diastolic CHF (congestive heart failure) (HCC)    COPD (chronic obstructive pulmonary disease) (H[facility])    Coronary artery disease    GERD (gastroesophageal reflux disease)    Hypercholesterolemia    Ischemic cardiomyopathy    a. EF 45-50%.   Mass of lung    "small tumor RUL; they are watching it" (02/27/2013)   Mitral regurgitation    Peripheral vascular disease, unspecified (HCC)    S/P TAVR (transcatheter aortic valve replacement)    23 mm Edwards Sapien 3 transcatheter heart valve placed via open left transaxillary approach    Severe aortic stenosis    Severe mitral regurgitation    Tachycardia, unspecified    Tobacco abuse     Past Surgical History:  Procedure Laterality Date   ABDOMINAL AORTOGRAM W/LOWER EXTREMITY N/A 08/03/2021   Procedure: ABDOMINAL AORTOGRAM W/LOWER EXTREMITY;  Surgeon: Marty Heck, MD;  Location: [facility];  Service: Cardiovascular;  Laterality: N/A;   CARDIAC CATHETERIZATION  08/2018   CORONARY ANGIOPLASTY WITH  STENT PLACEMENT  10/2012; 12/08/2012   "7 + 3" (02/27/2013)   ESOPHAGOGASTRODUODENOSCOPY (EGD) WITH PROPOFOL N/A 01/11/2015   Procedure: ESOPHAGOGASTRODUODENOSCOPY (EGD) WITH PROPOFOL;  Surgeon: Garlan Fair, MD;  Location: [facility];  Service: Endoscopy;  Laterality: N/A;   FLEXIBLE BRONCHOSCOPY N/A 03/02/2013   Procedure: FLEXIBLE BRONCHOSCOPY;  Surgeon: Gaye Pollack, MD;  Location: [place] [facility];  Service: Thoracic;  Laterality: N/A;   FLEXIBLE SIGMOIDOSCOPY N/A 01/11/2015   Procedure: FLEXIBLE SIGMOIDOSCOPY;  Surgeon: Garlan Fair, MD;  Location: [facility];  Service: Endoscopy;  Laterality: N/A;  unable  to complete colon-prep issues   GANGLION CYST EXCISION Left 1975   "wrist"   LEFT HEART CATH AND CORONARY ANGIOGRAPHY N/A 06/29/2016   Procedure: Left Heart Cath and Coronary Angiography;  Surgeon: Jettie Booze, MD;  Location: [facility];  Service: Cardiovascular;  Laterality: N/A;   LEFT HEART CATH AND CORONARY ANGIOGRAPHY N/A 08/19/2018   Procedure: LEFT HEART CATH AND CORONARY ANGIOGRAPHY;  Surgeon: Burnell Blanks, MD;  Location: [facility];  Service: Cardiovascular;  Laterality: N/A;   LEFT HEART CATHETERIZATION WITH CORONARY ANGIOGRAM N/A 12/08/2012   Procedure: LEFT HEART CATHETERIZATION WITH CORONARY ANGIOGRAM;  Surgeon: Jettie Booze, MD;  Location: [facility] LAB;  Service: Cardiovascular;  Laterality: N/A;   MOUTH SURGERY  2010?   "for bone loss" (06/24/2012)   PERCUTANEOUS CORONARY STENT INTERVENTION (PCI-S) N/A 10/09/2012   Procedure: PERCUTANEOUS CORONARY STENT INTERVENTION (PCI-S);  Surgeon: Jettie Booze, MD;  Location: [facility];  Service: Cardiovascular;  Laterality: N/A;   RIGHT HEART CATH N/A 08/19/2018   Procedure: RIGHT HEART CATH;  Surgeon: Burnell Blanks, MD;  Location: [facility];  Service: Cardiovascular;  Laterality: N/A;   TEE WITHOUT CARDIOVERSION N/A 08/22/2018   Procedure: TRANSESOPHAGEAL ECHOCARDIOGRAM (TEE);  Surgeon: Pixie Casino, MD;  Location: [facility];  Service: Cardiovascular;  Laterality: N/A;   TEE WITHOUT CARDIOVERSION N/A 08/26/2018   Procedure: TRANSESOPHAGEAL ECHOCARDIOGRAM (TEE);  Surgeon: Burnell Blanks, MD;  Location: [place];  Service: Open Heart Surgery;  Laterality: N/A;   THORACOTOMY/LOBECTOMY Right 03/02/2013   Procedure: Right Video Assisted Thoracoscopy/Thoracotomy with upper Lobectomy;  Surgeon: Gaye Pollack, MD;  Location: [facility] OR;  Service: Thoracic;  Laterality: Right;  Right Lung Upper  Lobectomy    TONSILLECTOMY  1960   VIDEO BRONCHOSCOPY  09/12/2011   Procedure: VIDEO  BRONCHOSCOPY WITH FLUORO;  Surgeon: Tanda Rockers, MD;  Location: [facility];  Service: Cardiopulmonary;  Laterality: Bilateral;     Current Outpatient Medications  Medication Sig Dispense Refill   albuterol (PROVENTIL) (2.5 MG/3ML) 0.083% nebulizer solution Take 2.5 mg by nebulization every 6 (six) hours as needed for shortness of breath or wheezing.     albuterol (VENTOLIN HFA) 108 (90 Base) MCG/ACT inhaler Inhale 2 puffs into the lungs every 6 (six) hours as needed for wheezing or shortness of breath.     Alirocumab (PRALUENT) 150 MG/ML SOAJ INJECT 1 PEN INTO THE SKIN EVERY 14 (FOURTEEN) DAYS. 6 mL 3   bisoprolol (ZEBETA) 5 MG tablet TAKE 1/2 TABLET BY MOUTH EVERY DAY 90 tablet 2   clindamycin (CLEOCIN) 300 MG capsule as directed. For dental procedures     clopidogrel (PLAVIX) 75 MG tablet TAKE 1 TABLET BY MOUTH EVERY DAY 90 tablet 2   empagliflozin (JARDIANCE) 10 MG TABS tablet Take 1 tablet (10 mg total) by mouth daily before breakfast. 90 tablet 6   Fluticasone-Umeclidin-Vilant (TRELEGY ELLIPTA) 100-62.5-25 MCG/INH  AEPB Inhale 1 puff into the lungs daily. 60 each 11   furosemide (LASIX) 40 MG tablet TAKE 1 TABLET BY MOUTH AS NEEDED FOR FLUID OR EDEMA (Patient taking differently: Take 40 mg by mouth 2 (two) times a week. Mondays & Fridays in the morning) 90 tablet 0   Hypromellose 0.2 % SOLN Place 2 drops into both eyes 4 (four) times daily as needed (dry eyes).      lansoprazole (PREVACID) 30 MG capsule Take 30 mg by mouth in the morning.     NASAL SALINE NA Place 1 spray into the nose daily as needed (congestion).      nitroGLYCERIN (NITROSTAT) 0.4 MG SL tablet DISSOLVE ONE TABLET UNDER TONGUE AS NEEDED FOR CHEST PAIN EVERY 5 MINUTES 25 tablet 4   sacubitril-valsartan (ENTRESTO) 24-26 MG Take 1 tablet by mouth 2 (two) times daily. Please wait to begin for 36 hours after discontinuing the Losartan. 180 tablet 3   scopolamine (TRANSDERM-SCOP) 1 MG/3DAYS SMARTSIG:1 Patch(s) Topical PRN      simethicone (MYLICON) 80 MG chewable tablet Chew 2 tablets (160 mg total) by mouth every 6 (six) hours as needed for flatulence. 30 tablet 0   Spacer/Aero-Holding Chambers (AEROCHAMBER MV) inhaler Use as instructed 1 each 0   Specialty Vitamins Products ([employer] MENOPAUSE SUPPORT PO) Take 1 capsule by mouth in the morning.     spironolactone (ALDACTONE) 25 MG tablet TAKE 1 TABLET(25 MG) BY MOUTH DAILY 90 tablet 3   No current facility-administered medications for this visit.    Allergies:   Azithromycin, Ceclor [cefaclor], Penicillins, Septra [sulfamethoxazole-trimethoprim], Doxycycline, Lipitor [atorvastatin], Morphine and related, Zetia [ezetimibe], Zocor [simvastatin], Adhesive [tape], and Vicodin [hydrocodone-acetaminophen]    Social History:  The patient  reports that she quit smoking about 8 years ago. Her smoking use included cigarettes. She has a 20.00 pack-year smoking history. She has never used smokeless tobacco. She reports that she does not drink alcohol and does not use drugs.   Family History:  The patient's family history includes Alcohol abuse in her father; Lupus in her mother; Migraines in her sister; Other in her sister; Pulmonary fibrosis in her brother.    ROS:  Please see the history of present illness.   Otherwise, review of systems are positive for mild, chronic shortness of breath.   All other systems are reviewed and negative.    PHYSICAL EXAM: VS:  BP 128/68   Pulse (!) 53   Ht 4' 11.5" (1.511 m)   Wt 132 lb (59.9 kg)   LMP 10/04/1999   SpO2 96%   BMI 26.21 kg/m  , BMI Body mass index is 26.21 kg/m. GEN: Well nourished, well developed, in no acute distress HEENT: normal Neck: no JVD, bilateral carotid bruits- sounds like aortic murmur radiating, or masses Cardiac: RRR; 2/6 systolic murmurs, no rubs, or gallops,no edema  Respiratory:  clear to auscultation bilaterally, normal work of breathing GI: soft, nontender, nondistended, + BS MS: no deformity or  atrophy Skin: warm and dry, no rash Neuro:  Strength and sensation are intact Psych: euthymic mood, full affect   EKG:   The ekg ordered today demonstrates normal sinus rhythm, left axis deviation, no ST segment changes   Recent Labs: 08/03/2021: BUN 21; Creatinine, Ser 1.10; Hemoglobin 15.3; Potassium 5.5; Sodium 140   Lipid Panel    Component Value Date/Time   CHOL 152 08/18/2015 0840   TRIG 105 08/18/2015 0840   HDL 65 08/18/2015 0840   CHOLHDL 2.3 08/18/2015 0840  VLDL 21 08/18/2015 0840   LDLCALC 66 08/18/2015 0840     Other studies Reviewed: Additional studies/ records that were reviewed today with results demonstrating: LDL 120 in September 2022.   ASSESSMENT AND PLAN:  CAD: No angina on medical therapy.  Status post multiple stents in her left system after complex LAD PCI complicated by dissection.  Continue clopidogrel indefinitely. Chronic systolic heart failure/HTN: EF has normalized.  She appears euvolemic.  Blood pressure well controlled.  Rare, borderline low blood pressures.  If this increases in frequency, would have to decrease her spironolactone to 12.5 mg daily. Hyperlipidemia: Tolerating the Praluent well.  Need to recheck lipids.  Want LDL target around 70.  S/p TAVR: Clindamycin or SBE prophylaxis.  Mild MR: severe MR prior to TAVR and medical Rx for CHF.  COPD/lung CA: s/p resection.  Chronic shortness of breath, but stable.  Has not required her inhaler in a long time. PAD: Needs aortobifemoral bypass surgery.  No further cardiac testing needed prior to surgery.    Current medicines are reviewed at length with the patient today.  The patient concerns regarding her medicines were addressed.  The following changes have been made:  No change  Labs/ tests ordered today include:   Orders Placed This Encounter  Procedures   Lipid panel   Hepatic function panel   EKG 12-Lead    Recommend 150 minutes/week of aerobic exercise Low fat, low carb, high  fiber diet recommended  Disposition:   FU in 1 year   Signed, Larae Grooms, MD  09/06/2021 10:16 AM    [facility] [place], [place],   [address] Phone: [phone]; Fax: [phone]

## 2021-09-06 ENCOUNTER — Ambulatory Visit: Payer: PPO | Admitting: Interventional Cardiology

## 2021-09-06 ENCOUNTER — Encounter: Payer: Self-pay | Admitting: Interventional Cardiology

## 2021-09-06 VITALS — BP 128/68 | HR 53 | Ht 59.5 in | Wt 132.0 lb

## 2021-09-06 DIAGNOSIS — I70213 Atherosclerosis of native arteries of extremities with intermittent claudication, bilateral legs: Secondary | ICD-10-CM

## 2021-09-06 DIAGNOSIS — Z952 Presence of prosthetic heart valve: Secondary | ICD-10-CM

## 2021-09-06 DIAGNOSIS — I10 Essential (primary) hypertension: Secondary | ICD-10-CM

## 2021-09-06 DIAGNOSIS — I340 Nonrheumatic mitral (valve) insufficiency: Secondary | ICD-10-CM | POA: Diagnosis not present

## 2021-09-06 DIAGNOSIS — I5042 Chronic combined systolic (congestive) and diastolic (congestive) heart failure: Secondary | ICD-10-CM

## 2021-09-06 NOTE — Patient Instructions (Addendum)
Medication Instructions:  Your physician recommends that you continue on your current medications as directed. Please refer to the Current Medication list given to you today.  *If you need a refill on your cardiac medications before your next appointment, please call your pharmacy*  Lab Work: Your physician recommends that you have lab work today- fasting lipid and liver panel  If you have labs (blood work) drawn today and your tests are completely normal, you will receive your results only by: [place] (if you have MyChart) OR A paper copy in the mail If you have any lab test that is abnormal or we need to change your treatment, we will call you to review the results.  Testing/Procedures: None ordered today.  Follow-Up: At [facility], you and your health needs are our priority.  As part of our continuing mission to provide you with exceptional heart care, we have created designated Provider Care Teams.  These Care Teams include your primary Cardiologist (physician) and Advanced Practice Providers (APPs -  Physician Assistants and Nurse Practitioners) who all work together to provide you with the care you need, when you need it.  We recommend signing up for the patient portal called "MyChart".  Sign up information is provided on this After Visit Summary.  MyChart is used to connect with patients for Virtual Visits (Telemedicine).  Patients are able to view lab/test results, encounter notes, upcoming appointments, etc.  Non-urgent messages can be sent to your provider as well.   To learn more about what you can do with MyChart, go to [email or web address].    Your next appointment:   12 month(s)  The format for your next appointment:   In Person  Provider:   Larae Grooms, MD {   Important Information About Sugar

## 2021-09-07 ENCOUNTER — Telehealth: Payer: Self-pay

## 2021-09-07 LAB — LIPID PANEL
Chol/HDL Ratio: 3.3 ratio (ref 0.0–4.4)
Cholesterol, Total: 169 mg/dL (ref 100–199)
HDL: 51 mg/dL (ref >39)
LDL Chol Calc (NIH): 101 mg/dL — ABNORMAL HIGH (ref 0–99)
Triglycerides: 91 mg/dL (ref 0–149)
VLDL Cholesterol Cal: 17 mg/dL (ref 5–40)

## 2021-09-07 LAB — HEPATIC FUNCTION PANEL
ALT: 12 IU/L (ref 0–32)
AST: 18 IU/L (ref 0–40)
Albumin: 4.6 g/dL (ref 3.8–4.8)
Alkaline Phosphatase: 82 IU/L (ref 44–121)
Bilirubin Total: 0.4 mg/dL (ref 0.0–1.2)
Bilirubin, Direct: 0.1 mg/dL (ref 0.00–0.40)
Total Protein: 7.2 g/dL (ref 6.0–8.5)

## 2021-09-07 MED ORDER — ROSUVASTATIN CALCIUM 5 MG PO TABS: 5.0000 mg | ORAL_TABLET | ORAL | 3 refills | Status: DC

## 2021-09-07 NOTE — Telephone Encounter (Signed)
-----   Message from Jettie Booze, MD sent at 09/07/2021  9:20 AM EDT ----- Please check with patient about adding back rosuvastatin 5 mg 3 x /week as noted below.   This may help lower LDL to target.   JV ----- Message ----- From: Leeroy Bock, RPH-CPP Sent: 09/07/2021   9:15 AM EDT To: Jettie Booze, MD; #  Would confirm pt has been adherent to her Praluent injections every other week. Would also consider adding back on low dose rosuvastatin. Looks like she took this in the past with LDL down in the teens on both therapies. Does have hx of statin and Zetia intolerance, could resume rosuvastatin at low dose of 5mg  3x a week.

## 2021-09-07 NOTE — Telephone Encounter (Signed)
The patient has been notified of the result and verbalized understanding.  All questions (if any) were answered. Michaelyn Barter, RN 09/07/2021 1:56 PM

## 2021-09-08 ENCOUNTER — Other Ambulatory Visit (HOSPITAL_COMMUNITY): Payer: Self-pay

## 2021-09-08 ENCOUNTER — Telehealth (HOSPITAL_COMMUNITY): Payer: Self-pay | Admitting: Pharmacy Technician

## 2021-09-08 MED ORDER — EMPAGLIFLOZIN 10 MG PO TABS
ORAL_TABLET | ORAL | 3 refills | Status: DC
  Filled 2021-09-08 – 2021-09-13 (×2): qty 90, 90d supply, fill #0
  Filled 2021-12-21: qty 90, 90d supply, fill #1
  Filled 2022-03-20: qty 90, 90d supply, fill #2

## 2021-09-08 NOTE — Telephone Encounter (Signed)
Advanced Heart Failure Patient Advocate Encounter  The patient was approved for a [employer] grant that will help cover the cost of Jardiance. Total amount awarded, $10,000. Eligibility, 08/09/21 - 08/09/22.  ID [id number]  BIN [id number]  PCN [id number]  Group [id number]  Spoke with patient. Put information into [facility] to fill at [facility] outpatient pharmacy for mail order.   Charlann Boxer, CPhT

## 2021-09-11 NOTE — Progress Notes (Unsigned)
[facility] [place]  ASSESSMENT / PLAN: Teresa [name] is a 71 y.o. female with atherosclerosis of native arteries of bilateral lower extremities causing intermittent claudication (L>R).  patients with asymptomatic peripheral arterial disease or claudication have a 1-2% risk of developing chronic limb threatening ischemia, but a 15-30% risk of mortality in the next 5 years. Intervention should only be considered for medically optimized patients with disabling symptoms.   Recommend the following which can slow the progression of atherosclerosis and reduce the risk of major adverse cardiac / limb events:  Complete cessation from all tobacco products. Blood glucose control with goal A1c < 7%. Blood pressure control with goal blood pressure < 140/90 mmHg. Lipid reduction therapy with goal LDL-C <100 mg/dL (<16 if symptomatic from PAD).  Clopidogrel 75mg  PO QD. Antilipid therapy per cardiology service.  Daily walking to and past the point of discomfort. Patient counseled to keep a log of exercise distance.  The patient has deteriorated despite optimal medical therapy.  She has a known left external iliac artery stenosis on CT angiogram from 2020 during work-up for TAVR.  Duplex today suggest that the left common iliac is involved as well.  We will plan for angiogram with possible intervention via right common femoral artery approach in the near future. The patient is aware that I will not be able to do her procedure for about two weeks. She is happy to have Dr. Chestine Spore do this for her on Thursday 6/1.  CHIEF COMPLAINT: Left leg cramping with walking  HISTORY OF PRESENT ILLNESS: Teresa [name] is a 71 y.o. female with extensive past vascular history (multiple PCI's, TAVR, congestive heart failure, etc.) who presents to clinic for evaluation of cramping discomfort in her left calf with walking.  The patient reports she can walk about the distance of 2 storefront's before  severe cramping pain in her calf begins to bother her.  She has to rest for the pain to be relieved.  She has no pain at rest.  She denies any ulcers about her feet.  08/01/21: Patient returns to clinic for reevaluation.  She reports she can get about 50 yards before she has severe cramping in her left thigh and calf.  This is interfering with her ability to care for children and take care of her IADLs.  We had a long discussion about risks, benefits, and alternatives to intervention for claudication.  She desires intervention.  09/12/21: ***  VASCULAR SURGICAL HISTORY: none  VASCULAR RISK FACTORS: Negative history of stroke / transient ischemic attack. Positive history of coronary artery disease. + history of PCI.  Negative history of CABG.  Negative history of diabetes mellitus.  No recent A1c available to me. Positive history of smoking.  Not actively smoking. Positive history of hypertension.  Negative history of chronic kidney disease.  Last GFR >60.  Positive history of chronic obstructive pulmonary disease. Not on oxygen.  FUNCTIONAL STATUS: ECOG performance status: (1) Restricted in physically strenuous activity, ambulatory and able to do work of light nature Ambulatory status: Ambulatory within the community with limits  Past Medical History:  Diagnosis Date   AAA (abdominal aortic aneurysm) (HCC)    a. small by cath 2014.   Acute on chronic combined systolic and diastolic heart failure (HCC) 08/19/2018   Arthritis    "across my hips; buttocks; comes w/the weather" (02/27/2013)   CAD (coronary artery disease), native coronary artery    a. 10/2012 - rotational atherectomy of LAD c/b dissection s/p  PCI to LAD, LCx, LM. b. 12/2012 - s/p overlapping DES to RCA.   Cataract    "just the beginnings on the right" (02/27/2013)   Chronic combined systolic and diastolic CHF (congestive heart failure) (HCC)    COPD (chronic obstructive pulmonary disease) (HCC)    Coronary artery disease     GERD (gastroesophageal reflux disease)    Hypercholesterolemia    Ischemic cardiomyopathy    a. EF 45-50%.   Mass of lung    "small tumor RUL; they are watching it" (02/27/2013)   Mitral regurgitation    Peripheral vascular disease, unspecified (HCC)    S/P TAVR (transcatheter aortic valve replacement)    23 mm Edwards Sapien 3 transcatheter heart valve placed via open left transaxillary approach    Severe aortic stenosis    Severe mitral regurgitation    Tachycardia, unspecified    Tobacco abuse     Past Surgical History:  Procedure Laterality Date   ABDOMINAL AORTOGRAM W/LOWER EXTREMITY N/A 08/03/2021   Procedure: ABDOMINAL AORTOGRAM W/LOWER EXTREMITY;  Surgeon: Cephus Shelling, MD;  Location: [facility];  Service: Cardiovascular;  Laterality: N/A;   CARDIAC CATHETERIZATION  08/2018   CORONARY ANGIOPLASTY WITH STENT PLACEMENT  10/2012; 12/08/2012   "7 + 3" (02/27/2013)   ESOPHAGOGASTRODUODENOSCOPY (EGD) WITH PROPOFOL N/A 01/11/2015   Procedure: ESOPHAGOGASTRODUODENOSCOPY (EGD) WITH PROPOFOL;  Surgeon: Charolett Bumpers, MD;  Location: [facility] ENDOS[facility];  Service: Endoscopy;  Laterality: N/A;   FLEXIBLE BRONCHOSCOPY N/A 03/02/2013   Procedure: FLEXIBLE BRONCHOSCOPY;  Surgeon: Alleen Borne, MD;  Location: [facility] OR;  Service: Thoracic;  Laterality: N/A;   FLEXIBLE SIGMOIDOSCOPY N/A 01/11/2015   Procedure: FLEXIBLE SIGMOIDOSCOPY;  Surgeon: Charolett Bumpers, MD;  Location: [facility];  Service: Endoscopy;  Laterality: N/A;  unable to complete colon-prep issues   GANGLION CYST EXCISION Left 1975   "wrist"   LEFT HEART CATH AND CORONARY ANGIOGRAPHY N/A 06/29/2016   Procedure: Left Heart Cath and Coronary Angiography;  Surgeon: Corky Crafts, MD;  Location: [facility] INVASIVE CV LAB;  Service: Cardiovascular;  Laterality: N/A;   LEFT HEART CATH AND CORONARY ANGIOGRAPHY N/A 08/19/2018   Procedure: LEFT HEART CATH AND CORONARY ANGIOGRAPHY;  Surgeon: Kathleene Hazel, MD;   Location: [facility];  Service: Cardiovascular;  Laterality: N/A;   LEFT HEART CATHETERIZATION WITH CORONARY ANGIOGRAM N/A 12/08/2012   Procedure: LEFT HEART CATHETERIZATION WITH CORONARY ANGIOGRAM;  Surgeon: Corky Crafts, MD;  Location[facility] CATH LAB;  Service: Cardiovascular;  Laterality: N/A;   MOUTH SURGERY  2010?   "for bone loss" (06/24/2012)   PERCUTANEOUS CORONARY STENT INTERVENTION (PCI-S) N/A 10/09/2012   Procedure: PERCUTANEOUS CORONARY STENT INTERVENTION (PCI-S);  Surgeon: Corky Crafts, MD;  Location: [facility] CATH LAB;  Service: Cardiovascular;  Laterality: N/A;   RIGHT HEART CATH N/A 08/19/2018   Procedure: RIGHT HEART CATH;  Surgeon: Kathleene Hazel, MD;  Location: [facility] CV [facility];  Service: Cardiovascular;  Laterality: N/A;   TEE WITHOUT CARDIOVERSION N/A 08/22/2018   Procedure: TRANSESOPHAGEAL ECHOCARDIOGRAM (TEE);  Surgeon: Chrystie Nose, MD;  Location: [facility] ENDOSCOPY;  Service: Cardiovascular;  Laterality: N/A;   TEE WITHOUT CARDIOVERSION N/A 08/26/2018   Procedure: TRANSESOPHAGEAL ECHOCARDIOGRAM (TEE);  Surgeon: Kathleene Hazel, MD;  Location: [facility] OR;  Service: Open Heart Surgery;  Laterality: N/A;   THORACOTOMY/LOBECTOMY Right 03/02/2013   Procedure: Right Video Assisted Thoracoscopy/Thoracotomy with upper Lobectomy;  Surgeon: Alleen Borne, MD;  Location: [facility] OR;  Service: Thoracic;  Laterality: Right;  Right  Lung Upper  Lobectomy    TONSILLECTOMY  1960   VIDEO BRONCHOSCOPY  09/12/2011   Procedure: VIDEO BRONCHOSCOPY WITH FLUORO;  Surgeon: Nyoka Cowden, MD;  Location: [facility] ENDOSCOPY;  Service: Cardiopulmonary;  Laterality: Bilateral;    Family History  Problem Relation Age of Onset   Alcohol abuse Father    Lupus Mother    Other Sister        Degenerative disc disease   Migraines Sister    Pulmonary fibrosis Brother    Heart attack Neg Hx     Social History   Socioeconomic History   Marital status: Married    Spouse name: Not on file    Number of children: 1   Years of education: 12   Highest education level: High school graduate  Occupational History   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  Tobacco Use   Smoking status: Former    Packs/day: 0.50    Years: 40.00    Total pack years: 20.00    Types: Cigarettes    Quit date: 10/08/2012    Years since quitting: 8.9   Smokeless tobacco: Never  Vaping Use   Vaping Use: Never used  Substance and Sexual Activity   Alcohol use: No   Drug use: No   Sexual activity: Yes  Other Topics Concern   Not on file  Social History Narrative   Not on file   Social Determinants of Health   Financial Resource Strain: Low Risk  (10/07/2018)   Overall Financial Resource Strain (CARDIA)    Difficulty of Paying Living Expenses: Not hard at all  Food Insecurity: No Food Insecurity (10/07/2018)   Hunger Vital Sign    Worried About Running Out of Food in the Last Year: Never true    Ran Out of Food in the Last Year: Never true  Transportation Needs: No Transportation Needs (10/07/2018)   PRAPARE - Administrator, Civil Service (Medical): No    Lack of Transportation (Non-Medical): No  Physical Activity: Insufficiently Active (10/07/2018)   Exercise Vital Sign    Days of Exercise per Week: 4 days    Minutes of Exercise per Session: 30 min  Stress: No Stress Concern Present (10/07/2018)   [employer] of Occupational Health - Occupational Stress Questionnaire    Feeling of Stress : Only a little  Social Connections: Not on file  Intimate Partner Violence: Not on file    Allergies  Allergen Reactions   Azithromycin Shortness Of Breath   Ceclor [Cefaclor] Shortness Of Breath and Rash   Penicillins Other (See Comments)    Unknown from childhood Did it involve swelling of the face/tongue/throat, SOB, or low BP? No Did it involve sudden or severe rash/hives, skin peeling, or any reaction on the inside of your mouth or nose? No Did you need to seek medical attention at a  hospital or doctor's office? Yes When did it last happen? Infancy  If all above answers are "NO", may proceed with cephalosporin use.    Septra [Sulfamethoxazole-Trimethoprim] Shortness Of Breath and Rash   Doxycycline Swelling and Other (See Comments)    Redness on the face   Lipitor [Atorvastatin] Other (See Comments)    Severe muscle aches   Morphine And Related Nausea And Vomiting and Other (See Comments)    Terrible headache   Zetia [Ezetimibe] Other (See Comments)    Severe stomach pain   Zocor [Simvastatin] Other (See Comments)    Severe muscle aches   Adhesive [Tape] Other (  See Comments)    Redness and swelling - use paper tape   Vicodin [Hydrocodone-Acetaminophen] Nausea And Vomiting    Current Outpatient Medications  Medication Sig Dispense Refill   albuterol (PROVENTIL) (2.5 MG/3ML) 0.083% nebulizer solution Take 2.5 mg by nebulization every 6 (six) hours as needed for shortness of breath or wheezing.     albuterol (VENTOLIN HFA) 108 (90 Base) MCG/ACT inhaler Inhale 2 puffs into the lungs every 6 (six) hours as needed for wheezing or shortness of breath.     Alirocumab (PRALUENT) 150 MG/ML SOAJ INJECT 1 PEN INTO THE SKIN EVERY 14 (FOURTEEN) DAYS. 6 mL 3   bisoprolol (ZEBETA) 5 MG tablet TAKE 1/2 TABLET BY MOUTH EVERY DAY 90 tablet 2   clindamycin (CLEOCIN) 300 MG capsule as directed. For dental procedures     clopidogrel (PLAVIX) 75 MG tablet TAKE 1 TABLET BY MOUTH EVERY DAY 90 tablet 2   empagliflozin (JARDIANCE) 10 MG TABS tablet Take 1 tablet (10 mg total) by mouth daily before breakfast. 90 tablet 6   empagliflozin (JARDIANCE) 10 MG TABS tablet Take 1 tablet by mouth daily. 90 tablet 3   Fluticasone-Umeclidin-Vilant (TRELEGY ELLIPTA) 100-62.5-25 MCG/INH AEPB Inhale 1 puff into the lungs daily. 60 each 11   furosemide (LASIX) 40 MG tablet TAKE 1 TABLET BY MOUTH AS NEEDED FOR FLUID OR EDEMA (Patient taking differently: Take 40 mg by mouth 2 (two) times a week. Mondays &  Fridays in the morning) 90 tablet 0   Hypromellose 0.2 % SOLN Place 2 drops into both eyes 4 (four) times daily as needed (dry eyes).      lansoprazole (PREVACID) 30 MG capsule Take 30 mg by mouth in the morning.     NASAL SALINE NA Place 1 spray into the nose daily as needed (congestion).      nitroGLYCERIN (NITROSTAT) 0.4 MG SL tablet DISSOLVE ONE TABLET UNDER TONGUE AS NEEDED FOR CHEST PAIN EVERY 5 MINUTES 25 tablet 4   rosuvastatin (CRESTOR) 5 MG tablet Take 1 tablet (5 mg total) by mouth 3 (three) times a week. 36 tablet 3   sacubitril-valsartan (ENTRESTO) 24-26 MG Take 1 tablet by mouth 2 (two) times daily. Please wait to begin for 36 hours after discontinuing the Losartan. 180 tablet 3   scopolamine (TRANSDERM-SCOP) 1 MG/3DAYS SMARTSIG:1 Patch(s) Topical PRN     simethicone (MYLICON) 80 MG chewable tablet Chew 2 tablets (160 mg total) by mouth every 6 (six) hours as needed for flatulence. 30 tablet 0   Spacer/Aero-Holding Chambers (AEROCHAMBER MV) inhaler Use as instructed 1 each 0   Specialty Vitamins Products ([employer] MENOPAUSE SUPPORT PO) Take 1 capsule by mouth in the morning.     spironolactone (ALDACTONE) 25 MG tablet TAKE 1 TABLET(25 MG) BY MOUTH DAILY 90 tablet 3   No current facility-administered medications for this visit.    PHYSICAL EXAM There were no vitals filed for this visit.    Constitutional: well appearing. no distress. Appears well nourished.  Neurologic: CN intact. no focal findings. no sensory loss. Psychiatric:  Mood and affect symmetric and appropriate. Eyes:  No icterus. No conjunctival pallor. Ears, nose, throat:  mucous membranes moist. Midline trachea.  Cardiac: regular rate and rhythm.  Respiratory:  unlabored. Abdominal:  soft, non-tender, non-distended.  Peripheral vascular: 1+ R femoral pulse. Absent L femoral pulse. No palpable pedal pulses. Good capillary refill. Extremity: no edema. no cyanosis. no pallor.  Skin: no gangrene. no ulceration.   Lymphatic: no Stemmer's sign. no palpable lymphadenopathy.  PERTINENT  LABORATORY AND RADIOLOGIC DATA  Most recent CBC    Latest Ref Rng & Units 08/03/2021    9:12 AM 07/13/2020   12:20 PM 01/04/2020    1:53 PM  CBC  WBC 4.0 - 10.5 K/uL  7.0  6.9   Hemoglobin 12.0 - 15.0 g/dL 16.1  09.6  04.5   Hematocrit 36.0 - 46.0 % 45.0  43.1  39.1   Platelets 150 - 400 K/uL  266  297      Most recent CMP    Latest Ref Rng & Units 09/06/2021    9:56 AM 08/03/2021    9:12 AM 11/11/2020   11:47 AM  CMP  Glucose 70 - 99 mg/dL  409  811   BUN 8 - 23 mg/dL  21  23   Creatinine 9.14 - 1.00 mg/dL  7.82  9.56   Sodium 213 - 145 mmol/L  140  138   Potassium 3.5 - 5.1 mmol/L  5.5  4.5   Chloride 98 - 111 mmol/L  102  101   CO2 22 - 32 mmol/L   26   Calcium 8.9 - 10.3 mg/dL   9.6   Total Protein 6.0 - 8.5 g/dL 7.2     Total Bilirubin 0.0 - 1.2 mg/dL 0.4     Alkaline Phos 44 - 121 IU/L 82     AST 0 - 40 IU/L 18     ALT 0 - 32 IU/L 12       Renal function CrCl cannot be calculated (Patient's most recent lab result is older than the maximum 21 days allowed.).  No results found for: "HGBA1C"  LDL Chol Calc (NIH)  Date Value Ref Range Status  09/06/2021 101 (H) 0 - 99 mg/dL Final    Aortoiliac duplex No evidence of anuerysm LCIA chronic dissection causing stenosis  Vascular Imaging:  +-------+-----------+-----------+------------+------------+  ABI/TBIToday's ABIToday's TBIPrevious ABIPrevious TBI  +-------+-----------+-----------+------------+------------+  Right  0.86       0.72                                 +-------+-----------+-----------+------------+------------+  Left   0.55       0.30                                 +-------+-----------+-----------+------------+------------+   Prior CT angiogram from 2020 suggests left external iliac critical stenosis. Diminutive iliac arteries bilaterally necessitating transaxillary TAVR.   Rande Brunt. Lenell Antu, MD [facility] Phone Number: [phone] 09/11/2021 2:55 PM  Total time spent on preparing this encounter including chart review, data review, collecting history, examining the patient, coordinating care for this established patient, 40 minutes.  Portions of this report may have been transcribed using voice recognition software.  Every effort has been made to ensure accuracy; however, inadvertent computerized transcription errors may still be present.

## 2021-09-11 NOTE — H&P (View-Only) (Signed)
[facility] [place]  ASSESSMENT / PLAN: Teresa [name] is a 71 y.o. female with atherosclerosis of native arteries of bilateral lower extremities causing intermittent claudication (L>R).  patients with asymptomatic peripheral arterial disease or claudication have a 1-2% risk of developing chronic limb threatening ischemia, but a 15-30% risk of mortality in the next 5 years. Intervention should only be considered for medically optimized patients with disabling symptoms.   Recommend the following which can slow the progression of atherosclerosis and reduce the risk of major adverse cardiac / limb events:  Complete cessation from all tobacco products. Blood glucose control with goal A1c < 7%. Blood pressure control with goal blood pressure < 140/90 mmHg. Lipid reduction therapy with goal LDL-C <100 mg/dL (<70 if symptomatic from PAD).  Clopidogrel 75mg  PO QD. Antilipid therapy per cardiology service.  Daily walking to and past the point of discomfort. Patient counseled to keep a log of exercise distance.  Patient has mixed aortoiliac occlusive and aneurysmal disease and right common femoral artery stenosis causing disabling claudication.  We again reviewed the risk profile of aortobifemoral bypass.  She is understanding of the risk of death, heart attack, stroke, renal insufficiency, respiratory failure.  She cannot tolerate her level of claudication and wishes to proceed.  I think she is medically optimized.  She has been optimized from a cardiac standpoint.  We will plan to proceed 09/20/2021.  I counseled her about the perioperative use of an epidural catheter and she is amenable to this.  CHIEF COMPLAINT: Left leg cramping with walking  HISTORY OF PRESENT ILLNESS: Teresa [name] is a 71 y.o. female with extensive past vascular history (multiple PCI's, TAVR, congestive heart failure, etc.) who presents to clinic for evaluation of cramping discomfort in her left calf  with walking.  The patient reports she can walk about the distance of 2 storefront's before severe cramping pain in her calf begins to bother her.  She has to rest for the pain to be relieved.  She has no pain at rest.  She denies any ulcers about her feet.  08/01/21: Patient returns to clinic for reevaluation.  She reports she can get about 50 yards before she has severe cramping in her left thigh and calf.  This is interfering with her ability to care for children and take care of her IADLs.  We had a long discussion about risks, benefits, and alternatives to intervention for claudication.  She desires intervention.  09/12/21: Patient returns after angiogram, CT scan, and cardiology evaluation.  She is ready to proceed with surgery.  We again reviewed the risks, benefits, and alternatives.  We again clarified her symptomology.  She reports short distance buttock and thigh claudication which is relieved by rest.  She does not report symptoms are relieved by positional change.  She does not have symptoms when standing for long period of time.  I do think her symptoms are related to peripheral arterial disease.  VASCULAR SURGICAL HISTORY: none  VASCULAR RISK FACTORS: Negative history of stroke / transient ischemic attack. Positive history of coronary artery disease. + history of PCI.  Negative history of CABG.  Negative history of diabetes mellitus.  No recent A1c available to me. Positive history of smoking.  Not actively smoking. Positive history of hypertension.  Negative history of chronic kidney disease.  Last GFR >60.  Positive history of chronic obstructive pulmonary disease. Not on oxygen.  FUNCTIONAL STATUS: ECOG performance status: (1) Restricted in physically strenuous activity, ambulatory and  able to do work of light nature Ambulatory status: Ambulatory within the community with limits  Past Medical History:  Diagnosis Date   AAA (abdominal aortic aneurysm) ([place])    a. small by cath  2014.   Acute on chronic combined systolic and diastolic heart failure ([place]) 08/19/2018   Arthritis    "across my hips; buttocks; comes w/the weather" (02/27/2013)   CAD (coronary artery disease), native coronary artery    a. 10/2012 - rotational atherectomy of LAD c/b dissection s/p PCI to LAD, LCx, LM. b. 12/2012 - s/p overlapping DES to RCA.   Cataract    "just the beginnings on the right" (02/27/2013)   Chronic combined systolic and diastolic CHF (congestive heart failure) (HCC)    COPD (chronic obstructive pulmonary disease) (HCC)    Coronary artery disease    GERD (gastroesophageal reflux disease)    Hypercholesterolemia    Ischemic cardiomyopathy    a. EF 45-50%.   Mass of lung    "small tumor RUL; they are watching it" (02/27/2013)   Mitral regurgitation    Peripheral vascular disease, unspecified (HCC)    S/P TAVR (transcatheter aortic valve replacement)    23 mm Edwards Sapien 3 transcatheter heart valve placed via open left transaxillary approach    Severe aortic stenosis    Severe mitral regurgitation    Tachycardia, unspecified    Tobacco abuse     Past Surgical History:  Procedure Laterality Date   ABDOMINAL AORTOGRAM W/LOWER EXTREMITY N/A 08/03/2021   Procedure: ABDOMINAL AORTOGRAM W/LOWER EXTREMITY;  Surgeon: Marty Heck, MD;  Location: [facility];  Service: Cardiovascular;  Laterality: N/A;   CARDIAC CATHETERIZATION  08/2018   CORONARY ANGIOPLASTY WITH STENT PLACEMENT  10/2012; 12/08/2012   "7 + 3" (02/27/2013)   ESOPHAGOGASTRODUODENOSCOPY (EGD) WITH PROPOFOL N/A 01/11/2015   Procedure: ESOPHAGOGASTRODUODENOSCOPY (EGD) WITH PROPOFOL;  Surgeon: Garlan Fair, MD;  Location: [facility];  Service: Endoscopy;  Laterality: N/A;   FLEXIBLE BRONCHOSCOPY N/A 03/02/2013   Procedure: FLEXIBLE BRONCHOSCOPY;  Surgeon: Gaye Pollack, MD;  Location: [facility];  Service: Thoracic;  Laterality: N/A;   FLEXIBLE SIGMOIDOSCOPY N/A 01/11/2015   Procedure: FLEXIBLE  SIGMOIDOSCOPY;  Surgeon: Garlan Fair, MD;  Location: [facility];  Service: Endoscopy;  Laterality: N/A;  unable to complete colon-prep issues   GANGLION CYST EXCISION Left 1975   "wrist"   LEFT HEART CATH AND CORONARY ANGIOGRAPHY N/A 06/29/2016   Procedure: Left Heart Cath and Coronary Angiography;  Surgeon: Jettie Booze, MD;  Location: [facility];  Service: Cardiovascular;  Laterality: N/A;   LEFT HEART CATH AND CORONARY ANGIOGRAPHY N/A 08/19/2018   Procedure: LEFT HEART CATH AND CORONARY ANGIOGRAPHY;  Surgeon: Burnell Blanks, MD;  Location: [facility];  Service: Cardiovascular;  Laterality: N/A;   LEFT HEART CATHETERIZATION WITH CORONARY ANGIOGRAM N/A 12/08/2012   Procedure: LEFT HEART CATHETERIZATION WITH CORONARY ANGIOGRAM;  Surgeon: Jettie Booze, MD;  Location: [facility] CATH LAB;  Service: Cardiovascular;  Laterality: N/A;   MOUTH SURGERY  2010?   "for bone loss" (06/24/2012)   PERCUTANEOUS CORONARY STENT INTERVENTION (PCI-S) N/A 10/09/2012   Procedure: PERCUTANEOUS CORONARY STENT INTERVENTION (PCI-S);  Surgeon: Jettie Booze, MD;  Location: [facility] CATH LAB;  Service: Cardiovascular;  Laterality: N/A;   RIGHT HEART CATH N/A 08/19/2018   Procedure: RIGHT HEART CATH;  Surgeon: Burnell Blanks, MD;  Location: [facility];  Service: Cardiovascular;  Laterality: N/A;   TEE WITHOUT CARDIOVERSION N/A 08/22/2018  Procedure: TRANSESOPHAGEAL ECHOCARDIOGRAM (TEE);  Surgeon: Pixie Casino, MD;  Location: [facility] ENDOSCOPY;  Service: Cardiovascular;  Laterality: N/A;   TEE WITHOUT CARDIOVERSION N/A 08/26/2018   Procedure: TRANSESOPHAGEAL ECHOCARDIOGRAM (TEE);  Surgeon: Burnell Blanks, MD;  Location: [facility];  Service: Open Heart Surgery;  Laterality: N/A;   THORACOTOMY/LOBECTOMY Right 03/02/2013   Procedure: Right Video Assisted Thoracoscopy/Thoracotomy with upper Lobectomy;  Surgeon: Gaye Pollack, MD;  Location: [facility] OR;  Service: Thoracic;   Laterality: Right;  Right Lung Upper  Lobectomy    TONSILLECTOMY  1960   VIDEO BRONCHOSCOPY  09/12/2011   Procedure: VIDEO BRONCHOSCOPY WITH FLUORO;  Surgeon: Tanda Rockers, MD;  Location: [facility];  Service: Cardiopulmonary;  Laterality: Bilateral;    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  Tobacco Use   Smoking status: Former    Packs/day: 0.50    Years: 40.00    Total pack years: 20.00    Types: Cigarettes    Quit date: 10/08/2012    Years since quitting: 8.9   Smokeless tobacco: Never  Vaping Use   Vaping Use: Never used  Substance and Sexual Activity   Alcohol use: No   Drug use: No   Sexual activity: Yes  Other Topics Concern   Not on file  Social History Narrative   Not on file   Social Determinants of Health   Financial Resource Strain: Low Risk  (10/07/2018)   Overall Financial Resource Strain (CARDIA)    Difficulty of Paying Living Expenses: Not hard at all  Food Insecurity: No Food Insecurity (10/07/2018)   Hunger Vital Sign    Worried About Running Out of Food in the Last Year: Never true    [place] in the Last Year: Never true  Transportation Needs: No Transportation Needs (10/07/2018)   PRAPARE - Hydrologist (Medical): No    Lack of Transportation (Non-Medical): No  Physical Activity: Insufficiently Active (10/07/2018)   Exercise Vital Sign    Days of Exercise per Week: 4 days    Minutes of Exercise per Session: 30 min  Stress: No Stress Concern Present (10/07/2018)   [place]    Feeling of Stress : Only a little  Social Connections: Not on file  Intimate Partner Violence: Not on file    Allergies  Allergen Reactions   Azithromycin Shortness Of Breath   Ceclor [Cefaclor] Shortness Of Breath and Rash   Penicillins Other (See Comments)    Unknown from childhood Did it involve swelling of the face/tongue/throat, SOB, or low BP? No Did it involve sudden or severe rash/hives, skin peeling, or any reaction on the inside of your mouth or nose? No Did you need to seek medical attention at a hospital or doctor's office? Yes When did it last happen? Infancy  If all above answers are "NO", may proceed with cephalosporin use.  Septra [Sulfamethoxazole-Trimethoprim] Shortness Of Breath and Rash   Doxycycline Swelling and Other (See Comments)    Redness on the face   Lipitor [Atorvastatin] Other (See Comments)    Severe muscle aches   Morphine And Related Nausea And Vomiting and Other (See Comments)    Terrible headache   Zetia [Ezetimibe] Other (See Comments)    Severe stomach pain   Zocor [Simvastatin] Other (See Comments)    Severe muscle aches   Adhesive [Tape] Other (See Comments)    Redness and swelling - use paper tape   Vicodin [Hydrocodone-Acetaminophen] Nausea And Vomiting    Current Outpatient Medications  Medication Sig Dispense Refill   albuterol (PROVENTIL) (2.5 MG/3ML) 0.083% nebulizer solution Take 2.5 mg by nebulization every 6 (six) hours as needed for shortness of breath or wheezing.     albuterol (VENTOLIN HFA) 108 (90 Base) MCG/ACT inhaler Inhale 2 puffs into the lungs every 6 (six) hours as needed for wheezing or shortness of breath.     Alirocumab (PRALUENT) 150 MG/ML SOAJ INJECT 1 PEN INTO THE SKIN EVERY 14 (FOURTEEN) DAYS. 6 mL 3   bisoprolol (ZEBETA) 5 MG tablet TAKE 1/2 TABLET BY MOUTH EVERY DAY 90 tablet 2   clindamycin (CLEOCIN) 300 MG capsule as directed. For dental procedures      clopidogrel (PLAVIX) 75 MG tablet TAKE 1 TABLET BY MOUTH EVERY DAY 90 tablet 2   empagliflozin (JARDIANCE) 10 MG TABS tablet Take 1 tablet (10 mg total) by mouth daily before breakfast. 90 tablet 6   empagliflozin (JARDIANCE) 10 MG TABS tablet Take 1 tablet by mouth daily. 90 tablet 3   Fluticasone-Umeclidin-Vilant (TRELEGY ELLIPTA) 100-62.5-25 MCG/INH AEPB Inhale 1 puff into the lungs daily. 60 each 11   furosemide (LASIX) 40 MG tablet TAKE 1 TABLET BY MOUTH AS NEEDED FOR FLUID OR EDEMA (Patient taking differently: Take 40 mg by mouth 2 (two) times a week. Mondays & Fridays in the morning) 90 tablet 0   Hypromellose 0.2 % SOLN Place 2 drops into both eyes 4 (four) times daily as needed (dry eyes).      lansoprazole (PREVACID) 30 MG capsule Take 30 mg by mouth in the morning.     NASAL SALINE NA Place 1 spray into the nose daily as needed (congestion).      nitroGLYCERIN (NITROSTAT) 0.4 MG SL tablet DISSOLVE ONE TABLET UNDER TONGUE AS NEEDED FOR CHEST PAIN EVERY 5 MINUTES 25 tablet 4   rosuvastatin (CRESTOR) 5 MG tablet Take 1 tablet (5 mg total) by mouth 3 (three) times a week. 36 tablet 3   sacubitril-valsartan (ENTRESTO) 24-26 MG Take 1 tablet by mouth 2 (two) times daily. Please wait to begin for 36 hours after discontinuing the Losartan. 180 tablet 3   scopolamine (TRANSDERM-SCOP) 1 MG/3DAYS SMARTSIG:1 Patch(s) Topical PRN     simethicone (MYLICON) 80 MG chewable tablet Chew 2 tablets (160 mg total) by mouth every 6 (six) hours as needed for flatulence. 30 tablet 0   Spacer/Aero-Holding Chambers (AEROCHAMBER MV) inhaler Use as instructed 1 each 0   Specialty Vitamins Products ([employer] MENOPAUSE SUPPORT PO) Take 1 capsule by mouth in the morning.     spironolactone (ALDACTONE) 25 MG tablet TAKE 1 TABLET(25 MG) BY MOUTH DAILY 90 tablet 3   No current facility-administered medications for this visit.    PHYSICAL EXAM There were no vitals filed for this visit.    Constitutional: well  appearing. no distress. Appears well nourished.  Neurologic: CN intact. no focal findings.  no sensory loss. Psychiatric:  Mood and affect symmetric and appropriate. Eyes:  No icterus. No conjunctival pallor. Ears, nose, throat:  mucous membranes moist. Midline trachea.  Cardiac: regular rate and rhythm.  Respiratory:  unlabored. Abdominal:  soft, non-tender, non-distended.  Peripheral vascular: 1+ R femoral pulse. Absent L femoral pulse. No palpable pedal pulses. Good capillary refill. Extremity: no edema. no cyanosis. no pallor.  Skin: no gangrene. no ulceration.  Lymphatic: no Stemmer's sign. no palpable lymphadenopathy.  PERTINENT LABORATORY AND RADIOLOGIC DATA  Most recent CBC    Latest Ref Rng & Units 08/03/2021    9:12 AM 07/13/2020   12:20 PM 01/04/2020    1:53 PM  CBC  WBC 4.0 - 10.5 K/uL  7.0  6.9   Hemoglobin 12.0 - 15.0 g/dL 15.3  13.6  12.6   Hematocrit 36.0 - 46.0 % 45.0  43.1  39.1   Platelets 150 - 400 K/uL  266  297      Most recent CMP    Latest Ref Rng & Units 09/06/2021    9:56 AM 08/03/2021    9:12 AM 11/11/2020   11:47 AM  CMP  Glucose 70 - 99 mg/dL  100  100   BUN 8 - 23 mg/dL  21  23   Creatinine 0.44 - 1.00 mg/dL  1.10  0.99   Sodium 135 - 145 mmol/L  140  138   Potassium 3.5 - 5.1 mmol/L  5.5  4.5   Chloride 98 - 111 mmol/L  102  101   CO2 22 - 32 mmol/L   26   Calcium 8.9 - 10.3 mg/dL   9.6   Total Protein 6.0 - 8.5 g/dL 7.2     Total Bilirubin 0.0 - 1.2 mg/dL 0.4     Alkaline Phos 44 - 121 IU/L 82     AST 0 - 40 IU/L 18     ALT 0 - 32 IU/L 12       Renal function CrCl cannot be calculated (Patient's most recent lab result is older than the maximum 21 days allowed.).  No results found for: "HGBA1C"  LDL Chol Calc (NIH)  Date Value Ref Range Status  09/06/2021 101 (H) 0 - 99 mg/dL Final    Aortoiliac duplex No evidence of anuerysm LCIA chronic dissection causing stenosis  Vascular  Imaging:  +-------+-----------+-----------+------------+------------+  ABI/TBIToday's ABIToday's TBIPrevious ABIPrevious TBI  +-------+-----------+-----------+------------+------------+  Right  0.86       0.72                                 +-------+-----------+-----------+------------+------------+  Left   0.55       0.30                                 +-------+-----------+-----------+------------+------------+   Prior CT angiogram from 2020 suggests left external iliac critical stenosis. Diminutive iliac arteries bilaterally necessitating transaxillary TAVR.   Yevonne Aline. Stanford Breed, MD [facility] Phone Number: [phone] 09/11/2021 2:55 PM  Total time spent on preparing this encounter including chart review, data review, collecting history, examining the patient, coordinating care for this established patient, 40 minutes.  Portions of this report may have been transcribed using voice recognition software.  Every effort has been made to ensure accuracy; however, inadvertent computerized transcription errors may still be present.

## 2021-09-12 ENCOUNTER — Encounter: Payer: Self-pay | Admitting: Vascular Surgery

## 2021-09-12 ENCOUNTER — Ambulatory Visit: Payer: PPO | Admitting: Vascular Surgery

## 2021-09-12 ENCOUNTER — Other Ambulatory Visit: Payer: Self-pay

## 2021-09-12 VITALS — BP 126/59 | HR 61 | Temp 97.9°F | Resp 20 | Ht 59.5 in | Wt 130.0 lb

## 2021-09-12 DIAGNOSIS — I70213 Atherosclerosis of native arteries of extremities with intermittent claudication, bilateral legs: Secondary | ICD-10-CM

## 2021-09-12 DIAGNOSIS — I7143 Infrarenal abdominal aortic aneurysm, without rupture: Secondary | ICD-10-CM | POA: Diagnosis not present

## 2021-09-12 DIAGNOSIS — I7409 Other arterial embolism and thrombosis of abdominal aorta: Secondary | ICD-10-CM

## 2021-09-13 ENCOUNTER — Other Ambulatory Visit (HOSPITAL_COMMUNITY): Payer: Self-pay

## 2021-09-15 NOTE — Progress Notes (Signed)
Surgical Instructions    Your procedure is scheduled on Wednesday, July 19th, 2023.   Report to [facility] Main Entrance "A" at 06:30 A.M., then check in with the Admitting office.  Call this number if you have problems the morning of surgery:  [phone]   If you have any questions prior to your surgery date call [phone]: Open Monday-Friday 8am-4pm    Remember:  Do not eat or drink after midnight the night before your surgery    Take these medicines the morning of surgery with A SIP OF WATER:   bisoprolol (ZEBETA) Fluticasone-Umeclidin-Vilant (TRELEGY ELLIPTA) lansoprazole (PREVACID)  rosuvastatin (CRESTOR)  If needed:  albuterol (PROVENTIL)  albuterol (VENTOLIN HFA) Hypromellose NASAL SALINE  nitroGLYCERIN (NITROSTAT) simethicone (MYLICON)   Please bring all inhalers with you the day of surgery.    Follow your surgeon's instructions on when to stop Plavix.  If no instructions were given by your surgeon then you will need to call the office to get those instructions.     As of today, STOP taking any Aspirin (unless otherwise instructed by your surgeon) Aleve, Naproxen, Ibuprofen, Motrin, Advil, Goody's, BC's, all herbal medications, fish oil, and all vitamins.  Do not take empagliflozin (JARDIANCE) the day before surgery and the day of surgery.    The day of surgery:          Do not wear jewelry or makeup Do not wear lotions, powders, perfumes, or deodorant. Do not shave 48 hours prior to surgery.   Do not bring valuables to the hospital. Do not wear nail polish, gel polish, artificial nails, or any other type of covering on natural nails (fingers and toes) If you have artificial nails or gel coating that need to be removed by a nail salon, please have this removed prior to surgery. Artificial nails or gel coating may interfere with anesthesia's ability to adequately monitor your vital signs.  [facility] is not responsible for any belongings or valuables. .    Do NOT Smoke (Tobacco/Vaping)  24 hours prior to your procedure  If you use a CPAP at night, you may bring your mask for your overnight stay.   Contacts, glasses, hearing aids, dentures or partials may not be worn into surgery, please bring cases for these belongings   For patients admitted to the hospital, discharge time will be determined by your treatment team.   Patients discharged the day of surgery will not be allowed to drive home, and someone needs to stay with them for 24 hours.   SURGICAL WAITING ROOM VISITATION Patients having surgery or a procedure may have no more than 2 support people in the waiting area - these visitors may rotate.   Children under the age of 44 must have an adult with them who is not the patient. If the patient needs to stay at the hospital during part of their recovery, the visitor guidelines for inpatient rooms apply. Pre-op nurse will coordinate an appropriate time for 1 support person to accompany patient in pre-op.  This support person may not rotate.   Please refer to the [facility] website for the visitor guidelines for Inpatients (after your surgery is over and you are in a regular room).    Special instructions:    Oral Hygiene is also important to reduce your risk of infection.  Remember - BRUSH YOUR TEETH THE MORNING OF SURGERY WITH YOUR REGULAR TOOTHPASTE   [place]- Preparing For Surgery  Before surgery, you can play an important role. Because  skin is not sterile, your skin needs to be as free of germs as possible. You can reduce the number of germs on your skin by washing with CHG (chlorahexidine gluconate) Soap before surgery.  CHG is an antiseptic cleaner which kills germs and bonds with the skin to continue killing germs even after washing.     Please do not use if you have an allergy to CHG or antibacterial soaps. If your skin becomes reddened/irritated stop using the CHG.  Do not shave (including legs and underarms) for at  least 48 hours prior to first CHG shower. It is OK to shave your face.  Please follow these instructions carefully.     Shower the NIGHT BEFORE SURGERY and the MORNING OF SURGERY with CHG Soap.   If you chose to wash your hair, wash your hair first as usual with your normal shampoo. After you shampoo, rinse your hair and body thoroughly to remove the shampoo.  Then [employer] and genitals (private parts) with your normal soap and rinse thoroughly to remove soap.  After that Use CHG Soap as you would any other liquid soap. You can apply CHG directly to the skin and wash gently with a scrungie or a clean washcloth.   Apply the CHG Soap to your body ONLY FROM THE NECK DOWN.  Do not use on open wounds or open sores. Avoid contact with your eyes, ears, mouth and genitals (private parts). Wash Face and genitals (private parts)  with your normal soap.   Wash thoroughly, paying special attention to the area where your surgery will be performed.  Thoroughly rinse your body with warm water from the neck down.  DO NOT shower/wash with your normal soap after using and rinsing off the CHG Soap.  Pat yourself dry with a CLEAN TOWEL.  Wear CLEAN PAJAMAS to bed the night before surgery  Place CLEAN SHEETS on your bed the night before your surgery  DO NOT SLEEP WITH PETS.   Day of Surgery:  Take a shower with CHG soap. Wear Clean/Comfortable clothing the morning of surgery Do not apply any deodorants/lotions.   Remember to brush your teeth WITH YOUR REGULAR TOOTHPASTE.    If you received a COVID test during your pre-op visit, it is requested that you wear a mask when out in public, stay away from anyone that may not be feeling well, and notify your surgeon if you develop symptoms. If you have been in contact with anyone that has tested positive in the last 10 days, please notify your surgeon.    Please read over the following fact sheets that you were given.

## 2021-09-18 ENCOUNTER — Encounter (HOSPITAL_COMMUNITY)
Admission: RE | Admit: 2021-09-18 | Discharge: 2021-09-18 | Disposition: A | Discharge status: Home / Self Care | Payer: PPO | Source: Ambulatory Visit | Attending: Vascular Surgery | Admitting: Vascular Surgery

## 2021-09-18 ENCOUNTER — Encounter (HOSPITAL_COMMUNITY): Payer: Self-pay

## 2021-09-18 ENCOUNTER — Other Ambulatory Visit: Payer: Self-pay

## 2021-09-18 VITALS — BP 108/44 | HR 80 | Temp 98.2°F | Resp 18 | Ht 59.0 in | Wt 129.5 lb

## 2021-09-18 DIAGNOSIS — Z01812 Encounter for preprocedural laboratory examination: Secondary | ICD-10-CM | POA: Insufficient documentation

## 2021-09-18 DIAGNOSIS — I70213 Atherosclerosis of native arteries of extremities with intermittent claudication, bilateral legs: Secondary | ICD-10-CM

## 2021-09-18 DIAGNOSIS — R0902 Hypoxemia: Secondary | ICD-10-CM | POA: Diagnosis present

## 2021-09-18 DIAGNOSIS — I110 Hypertensive heart disease with heart failure: Secondary | ICD-10-CM | POA: Diagnosis present

## 2021-09-18 DIAGNOSIS — Z85828 Personal history of other malignant neoplasm of skin: Secondary | ICD-10-CM | POA: Diagnosis not present

## 2021-09-18 DIAGNOSIS — R319 Hematuria, unspecified: Secondary | ICD-10-CM | POA: Diagnosis not present

## 2021-09-18 DIAGNOSIS — D62 Acute posthemorrhagic anemia: Secondary | ICD-10-CM | POA: Diagnosis not present

## 2021-09-18 DIAGNOSIS — I2510 Atherosclerotic heart disease of native coronary artery without angina pectoris: Secondary | ICD-10-CM | POA: Insufficient documentation

## 2021-09-18 DIAGNOSIS — I959 Hypotension, unspecified: Secondary | ICD-10-CM | POA: Diagnosis not present

## 2021-09-18 DIAGNOSIS — N261 Atrophy of kidney (terminal): Secondary | ICD-10-CM | POA: Diagnosis present

## 2021-09-18 DIAGNOSIS — I70201 Unspecified atherosclerosis of native arteries of extremities, right leg: Secondary | ICD-10-CM | POA: Diagnosis present

## 2021-09-18 DIAGNOSIS — Z87891 Personal history of nicotine dependence: Secondary | ICD-10-CM | POA: Diagnosis not present

## 2021-09-18 DIAGNOSIS — Z20822 Contact with and (suspected) exposure to covid-19: Secondary | ICD-10-CM | POA: Diagnosis present

## 2021-09-18 DIAGNOSIS — I719 Aortic aneurysm of unspecified site, without rupture: Secondary | ICD-10-CM | POA: Diagnosis not present

## 2021-09-18 DIAGNOSIS — I5020 Unspecified systolic (congestive) heart failure: Secondary | ICD-10-CM | POA: Insufficient documentation

## 2021-09-18 DIAGNOSIS — Z888 Allergy status to other drugs, medicaments and biological substances status: Secondary | ICD-10-CM | POA: Diagnosis not present

## 2021-09-18 DIAGNOSIS — I517 Cardiomegaly: Secondary | ICD-10-CM | POA: Diagnosis not present

## 2021-09-18 DIAGNOSIS — R310 Gross hematuria: Secondary | ICD-10-CM | POA: Diagnosis present

## 2021-09-18 DIAGNOSIS — I7143 Infrarenal abdominal aortic aneurysm, without rupture: Secondary | ICD-10-CM | POA: Diagnosis present

## 2021-09-18 DIAGNOSIS — I9589 Other hypotension: Secondary | ICD-10-CM | POA: Diagnosis not present

## 2021-09-18 DIAGNOSIS — C7800 Secondary malignant neoplasm of unspecified lung: Secondary | ICD-10-CM | POA: Insufficient documentation

## 2021-09-18 DIAGNOSIS — I700 Atherosclerosis of aorta: Secondary | ICD-10-CM | POA: Diagnosis not present

## 2021-09-18 DIAGNOSIS — J9811 Atelectasis: Secondary | ICD-10-CM | POA: Diagnosis not present

## 2021-09-18 DIAGNOSIS — R578 Other shock: Secondary | ICD-10-CM | POA: Diagnosis not present

## 2021-09-18 DIAGNOSIS — J449 Chronic obstructive pulmonary disease, unspecified: Secondary | ICD-10-CM | POA: Diagnosis present

## 2021-09-18 DIAGNOSIS — I080 Rheumatic disorders of both mitral and aortic valves: Secondary | ICD-10-CM | POA: Insufficient documentation

## 2021-09-18 DIAGNOSIS — G8918 Other acute postprocedural pain: Secondary | ICD-10-CM | POA: Diagnosis not present

## 2021-09-18 DIAGNOSIS — Z880 Allergy status to penicillin: Secondary | ICD-10-CM | POA: Diagnosis not present

## 2021-09-18 DIAGNOSIS — R739 Hyperglycemia, unspecified: Secondary | ICD-10-CM | POA: Diagnosis not present

## 2021-09-18 DIAGNOSIS — Z79899 Other long term (current) drug therapy: Secondary | ICD-10-CM | POA: Insufficient documentation

## 2021-09-18 DIAGNOSIS — E875 Hyperkalemia: Secondary | ICD-10-CM | POA: Diagnosis present

## 2021-09-18 DIAGNOSIS — R9431 Abnormal electrocardiogram [ECG] [EKG]: Secondary | ICD-10-CM | POA: Diagnosis not present

## 2021-09-18 DIAGNOSIS — I5042 Chronic combined systolic (congestive) and diastolic (congestive) heart failure: Secondary | ICD-10-CM | POA: Diagnosis present

## 2021-09-18 DIAGNOSIS — R579 Shock, unspecified: Secondary | ICD-10-CM | POA: Diagnosis not present

## 2021-09-18 DIAGNOSIS — I255 Ischemic cardiomyopathy: Secondary | ICD-10-CM | POA: Diagnosis present

## 2021-09-18 DIAGNOSIS — Z01818 Encounter for other preprocedural examination: Secondary | ICD-10-CM

## 2021-09-18 DIAGNOSIS — Z955 Presence of coronary angioplasty implant and graft: Secondary | ICD-10-CM | POA: Diagnosis not present

## 2021-09-18 DIAGNOSIS — R110 Nausea: Secondary | ICD-10-CM | POA: Diagnosis not present

## 2021-09-18 DIAGNOSIS — Z952 Presence of prosthetic heart valve: Secondary | ICD-10-CM | POA: Diagnosis not present

## 2021-09-18 DIAGNOSIS — R0600 Dyspnea, unspecified: Secondary | ICD-10-CM | POA: Diagnosis not present

## 2021-09-18 DIAGNOSIS — Z885 Allergy status to narcotic agent status: Secondary | ICD-10-CM | POA: Diagnosis not present

## 2021-09-18 DIAGNOSIS — I7409 Other arterial embolism and thrombosis of abdominal aorta: Secondary | ICD-10-CM | POA: Diagnosis present

## 2021-09-18 DIAGNOSIS — D696 Thrombocytopenia, unspecified: Secondary | ICD-10-CM | POA: Diagnosis not present

## 2021-09-18 DIAGNOSIS — D6959 Other secondary thrombocytopenia: Secondary | ICD-10-CM | POA: Diagnosis present

## 2021-09-18 DIAGNOSIS — R571 Hypovolemic shock: Secondary | ICD-10-CM | POA: Diagnosis not present

## 2021-09-18 HISTORY — DX: Malignant (primary) neoplasm, unspecified: C80.1

## 2021-09-18 LAB — COMPREHENSIVE METABOLIC PANEL
ALT: 15 U/L (ref 0–44)
AST: 22 U/L (ref 15–41)
Albumin: 4.3 g/dL (ref 3.5–5.0)
Alkaline Phosphatase: 68 U/L (ref 38–126)
Anion gap: 5 (ref 5–15)
BUN: 20 mg/dL (ref 8–23)
CO2: 28 mmol/L (ref 22–32)
Calcium: 9.3 mg/dL (ref 8.9–10.3)
Chloride: 105 mmol/L (ref 98–111)
Creatinine, Ser: 1.13 mg/dL — ABNORMAL HIGH (ref 0.44–1.00)
GFR, Estimated: 53 mL/min — ABNORMAL LOW (ref >60)
Glucose, Bld: 135 mg/dL — ABNORMAL HIGH (ref 70–99)
Potassium: 4.3 mmol/L (ref 3.5–5.1)
Sodium: 138 mmol/L (ref 135–145)
Total Bilirubin: 1 mg/dL (ref 0.3–1.2)
Total Protein: 7.8 g/dL (ref 6.5–8.1)

## 2021-09-18 LAB — URINALYSIS, ROUTINE W REFLEX MICROSCOPIC
Bacteria, UA: NONE SEEN
Bilirubin Urine: NEGATIVE
Glucose, UA: >=500 mg/dL — AB
Hgb urine dipstick: NEGATIVE
Ketones, ur: NEGATIVE mg/dL
Leukocytes,Ua: NEGATIVE
Nitrite: NEGATIVE
Protein, ur: NEGATIVE mg/dL
Specific Gravity, Urine: 1.011 (ref 1.005–1.030)
pH: 5 (ref 5.0–8.0)

## 2021-09-18 LAB — PROTIME-INR
INR: 1.1 (ref 0.8–1.2)
Prothrombin Time: 13.8 seconds (ref 11.4–15.2)

## 2021-09-18 LAB — CBC
HCT: 46.1 % — ABNORMAL HIGH (ref 36.0–46.0)
Hemoglobin: 14.6 g/dL (ref 12.0–15.0)
MCH: 29.6 pg (ref 26.0–34.0)
MCHC: 31.7 g/dL (ref 30.0–36.0)
MCV: 93.3 fL (ref 80.0–100.0)
Platelets: 312 10*3/uL (ref 150–400)
RBC: 4.94 MIL/uL (ref 3.87–5.11)
RDW: 13.7 % (ref 11.5–15.5)
WBC: 7.4 10*3/uL (ref 4.0–10.5)
nRBC: 0 % (ref 0.0–0.2)

## 2021-09-18 LAB — APTT: aPTT: 25 seconds (ref 24–36)

## 2021-09-18 LAB — SURGICAL PCR SCREEN
MRSA, PCR: NEGATIVE
Staphylococcus aureus: POSITIVE — AB

## 2021-09-18 NOTE — Progress Notes (Addendum)
Abnormal UA in PAT - Glucose, UA > 500. Dr. Stanford Breed office was notified [facility] [place], [place]). Also, Dr. Stanford Breed was notified via [place].

## 2021-09-18 NOTE — Progress Notes (Addendum)
PCP - Lorenda Ishihara, MD Cardiologist - Lance Muss, MD  PPM/ICD - denies Device Orders - n/a Rep Notified - n/a  Chest x-ray - n/a EKG - 09/06/2021 Stress Test - 09/18/2012 - records requested ECHO - 04/21/2021 Cardiac Cath - 08/19/2018  Sleep Study - denies CPAP - n/a  Fasting Blood Sugar - n/a  Blood Thinner Instructions: Plavix - last dose - 09/14/2021 per patient  Aspirin Instructions: Patient was instructed: As of today, STOP taking any Aspirin (unless otherwise instructed by your surgeon) Aleve, Naproxen, Ibuprofen, Motrin, Advil, Goody's, BC's, all herbal medications, fish oil, and all vitamins.    ERAS Protcol - n/a  COVID TEST- yes - done in PAT on 09/18/2021   Anesthesia review: yes - cardiac history  Patient denies shortness of breath, fever, cough and chest pain at PAT appointment   All instructions explained to the patient, with a verbal understanding of the material. Patient agrees to go over the instructions while at home for a better understanding. Patient also instructed to self quarantine after being tested for COVID-19. The opportunity to ask questions was provided.

## 2021-09-19 LAB — SARS CORONAVIRUS 2 (TAT 6-24 HRS): SARS Coronavirus 2: NEGATIVE

## 2021-09-19 NOTE — Anesthesia Preprocedure Evaluation (Addendum)
Anesthesia Evaluation  Patient identified by MRN, date of birth, ID band Patient awake    Reviewed: Allergy & Precautions, NPO status , Patient's Chart, lab work & pertinent test results  History of Anesthesia Complications (+) PONV and history of anesthetic complications  Airway Mallampati: II  TM Distance: >3 FB Neck ROM: Full    Dental no notable dental hx.    Pulmonary COPD,  COPD inhaler, former smoker,    Pulmonary exam normal        Cardiovascular + CAD, + Cardiac Stents, + Peripheral Vascular Disease and +CHF   Rhythm:Regular Rate:Normal  S/p TAVR 2020   Neuro/Psych negative neurological ROS  negative psych ROS   GI/Hepatic Neg liver ROS, GERD  Medicated,  Endo/Other  negative endocrine ROS  Renal/GU negative Renal ROS  negative genitourinary   Musculoskeletal  (+) Arthritis , Osteoarthritis,    Abdominal Normal abdominal exam  (+)   Peds  Hematology negative hematology ROS (+)   Anesthesia Other Findings   Reproductive/Obstetrics                           Anesthesia Physical Anesthesia Plan  ASA: 4  Anesthesia Plan: General and Epidural   Post-op Pain Management: Epidural*   Induction: Intravenous  PONV Risk Score and Plan: 4 or greater and Ondansetron, Dexamethasone, Midazolam, Treatment may vary due to age or medical condition and Amisulpride  Airway Management Planned: Mask and Oral ETT  Additional Equipment: Arterial line and None  Intra-op Plan:   Post-operative Plan: Extubation in OR  Informed Consent: I have reviewed the patients History and Physical, chart, labs and discussed the procedure including the risks, benefits and alternatives for the proposed anesthesia with the patient or authorized representative who has indicated his/her understanding and acceptance.     Dental advisory given  Plan Discussed with: CRNA  Anesthesia Plan Comments: (PAT note  by Karoline Caldwell, PA-C:" Pertinent hx includes COPD, lung CA s/p RUL resection 2014, CAD (prior stenting of the LM, LAD and circumflex in the setting of dissection), AS s/p TAVR 4/78 andsystolic HF with recovered EF. Seen by her cardiologist Dr. Irish Lack for preop eval on 09/06/21. Per note, doing well from cardiac standpoint and cleared for surgery - "GNF:[id number] bypass surgery. No further cardiac testing needed prior to surgery."  Patient reports last dose Plavix 09/14/2021.  Preop labs reviewed, unremarkable.  EKG 09/06/2021: Sinus bradycardia.  Rate 53.  Septal infarct, age undetermined.  Spirometry 01/08/2018: FVC-%Pred-Pre % 47 FEV1-%Pred-Pre % 40 FEV1FVC-%Pred-Pre % 83 TLC % pred % 57 RV % pred % 59   TTE 04/21/21: 1. Left ventricular ejection fraction, by estimation, is 50 to 55%. The  left ventricle has low normal function. The left ventricle demonstrates  regional wall motion abnormalities (see scoring diagram/findings for  description). Left ventricular diastolic  parameters are consistent with Grade I diastolic dysfunction (impaired  relaxation). There is akinesis of the left ventricular with apical  aneurysm.  2. Right ventricular systolic function is normal. The right ventricular  size is normal.  3. Left atrial size was mildly dilated.  4. Right atrial size was mildly dilated.  5. The mitral valve is normal in structure. Mild mitral valve  regurgitation. No evidence of mitral stenosis.  6. Trivial perivalvular leak. The aortic valve is normal in structure.  Aortic valve regurgitation is trivial. No aortic stenosis is present.  There is a 23 mm Sapien prosthetic (TAVR) valve present in the aortic  position. Procedure Date: 08/26/18. Echo  findings are consistent with normal structure and function of the aortic  valve prosthesis. Aortic valve area, by VTI measures 3.31 cm. Aortic  valve mean gradient measures 14.3 mmHg. Aortic valve Vmax measures 2.54   m/s.  7. The inferior vena cava is normal in size with greater than 50%  respiratory variability, suggesting right atrial pressure of 3 mmHg.   Cat 08/19/18: 1. Triple vessel CAD.  2. Patent stents in the left main, LAD and Circumflex (placed at the time of dissection) 3. Chronic occlusion small Obtuse marginal branch which fills from right to left collaterals 4. Patent mid to distal RCA stent. The proximal RCA has a moderate non-obstructive stenosis which does not appear to be flow limiting. This is unchanged from her last cath.  5. Severe aortic stenosis (mean gradient 21 mmHg, peak to peak gradient 36 mmHg, AVA 0.71 cm2) 6. Elevated filling pressures c/w acute volume overload (RA 9/14, RV 57/11/12, PA 58/27 mean 39, PCWP 33)  Recommendations: Medical management of CAD. I think the proximal RCA stenosis is unchanged from her last cath and does not appear to be flow limiting. She has severe AS. Will begin workup for TAVR. I suspect she will be a TAVR candidate although she would be a surgical candidate as well if we leaned toward open surgical  AVR. She is volume overloaded today. Will admit for diuresis with IV Lasix.    )      Anesthesia Quick Evaluation

## 2021-09-19 NOTE — Progress Notes (Signed)
Anesthesia Chart Review:  Pertinent hx includes COPD, lung CA s/p RUL resection 2014, CAD (prior stenting of the LM, LAD and circumflex in the setting of dissection), AS s/p TAVR 0/93 and systolic HF with recovered EF. Seen by her cardiologist Dr. Irish Lack for preop eval on 09/06/21. Per note, doing well from cardiac standpoint and cleared for surgery - "PAD: Needs aortobifemoral bypass surgery.  No further cardiac testing needed prior to surgery."   Patient reports last dose Plavix 09/14/2021.  Preop labs reviewed, unremarkable.  EKG 09/06/2021: Sinus bradycardia.  Rate 53.  Septal infarct, age undetermined.  Spirometry 01/08/2018: FVC-%Pred-Pre % 47  FEV1-%Pred-Pre % 40  FEV1FVC-%Pred-Pre % 83  TLC % pred % 57  RV % pred % 59    TTE 04/21/21:  1. Left ventricular ejection fraction, by estimation, is 50 to 55%. The  left ventricle has low normal function. The left ventricle demonstrates  regional wall motion abnormalities (see scoring diagram/findings for  description). Left ventricular diastolic   parameters are consistent with Grade I diastolic dysfunction (impaired  relaxation). There is akinesis of the left ventricular with apical  aneurysm.   2. Right ventricular systolic function is normal. The right ventricular  size is normal.   3. Left atrial size was mildly dilated.   4. Right atrial size was mildly dilated.   5. The mitral valve is normal in structure. Mild mitral valve  regurgitation. No evidence of mitral stenosis.   6. Trivial perivalvular leak. The aortic valve is normal in structure.  Aortic valve regurgitation is trivial. No aortic stenosis is present.  There is a 23 mm Sapien prosthetic (TAVR) valve present in the aortic  position. Procedure Date: 08/26/18. Echo  findings are consistent with normal structure and function of the aortic  valve prosthesis. Aortic valve area, by VTI measures 3.31 cm. Aortic  valve mean gradient measures 14.3 mmHg. Aortic valve Vmax  measures 2.54  m/s.   7. The inferior vena cava is normal in size with greater than 50%  respiratory variability, suggesting right atrial pressure of 3 mmHg.   Cat 08/19/18: 1. Triple vessel CAD.  2. Patent stents in the left main, LAD and Circumflex (placed at the time of dissection) 3. Chronic occlusion small Obtuse marginal branch which fills from right to left collaterals 4. Patent mid to distal RCA stent. The proximal RCA has a moderate non-obstructive stenosis which does not appear to be flow limiting. This is unchanged from her last cath.  5. Severe aortic stenosis (mean gradient 21 mmHg, peak to peak gradient 36 mmHg, AVA 0.71 cm2) 6. Elevated filling pressures c/w acute volume overload (RA 9/14, RV 57/11/12, PA 58/27 mean 39, PCWP 33)   Recommendations: Medical management of CAD. I think the proximal RCA stenosis is unchanged from her last cath and does not appear to be flow limiting. She has severe AS. Will begin workup for TAVR. I suspect she will be a TAVR candidate although she would be a surgical candidate as well if we leaned toward open surgical  AVR. She is volume overloaded today. Will admit for diuresis with IV Lasix.     Wynonia Musty [facility] Short Stay Center/Anesthesiology Phone [phone] 09/19/2021 10:45 AM

## 2021-09-20 ENCOUNTER — Inpatient Hospital Stay (HOSPITAL_COMMUNITY): Payer: PPO

## 2021-09-20 ENCOUNTER — Inpatient Hospital Stay (HOSPITAL_COMMUNITY): Payer: PPO | Admitting: Anesthesiology

## 2021-09-20 ENCOUNTER — Encounter (HOSPITAL_COMMUNITY)
Admission: RE | Disposition: A | Discharge status: Home / Self Care | Payer: Self-pay | Source: Ambulatory Visit | Attending: Vascular Surgery

## 2021-09-20 ENCOUNTER — Inpatient Hospital Stay (HOSPITAL_COMMUNITY)
Admission: RE | Admit: 2021-09-20 | Discharge: 2021-09-27 | DRG: 268 | Disposition: A | Discharge status: Home Health | Payer: PPO | Source: Ambulatory Visit | Attending: Vascular Surgery | Admitting: Vascular Surgery

## 2021-09-20 ENCOUNTER — Encounter (HOSPITAL_COMMUNITY): Payer: Self-pay | Admitting: Vascular Surgery

## 2021-09-20 ENCOUNTER — Other Ambulatory Visit: Payer: Self-pay

## 2021-09-20 ENCOUNTER — Inpatient Hospital Stay (HOSPITAL_COMMUNITY): Payer: PPO | Admitting: Emergency Medicine

## 2021-09-20 DIAGNOSIS — I7409 Other arterial embolism and thrombosis of abdominal aorta: Secondary | ICD-10-CM

## 2021-09-20 DIAGNOSIS — Z8679 Personal history of other diseases of the circulatory system: Secondary | ICD-10-CM

## 2021-09-20 DIAGNOSIS — R319 Hematuria, unspecified: Secondary | ICD-10-CM

## 2021-09-20 DIAGNOSIS — I255 Ischemic cardiomyopathy: Secondary | ICD-10-CM | POA: Diagnosis present

## 2021-09-20 DIAGNOSIS — I70201 Unspecified atherosclerosis of native arteries of extremities, right leg: Secondary | ICD-10-CM | POA: Diagnosis present

## 2021-09-20 DIAGNOSIS — Z832 Family history of diseases of the blood and blood-forming organs and certain disorders involving the immune mechanism: Secondary | ICD-10-CM

## 2021-09-20 DIAGNOSIS — J9811 Atelectasis: Secondary | ICD-10-CM | POA: Diagnosis not present

## 2021-09-20 DIAGNOSIS — I959 Hypotension, unspecified: Secondary | ICD-10-CM

## 2021-09-20 DIAGNOSIS — I719 Aortic aneurysm of unspecified site, without rupture: Secondary | ICD-10-CM

## 2021-09-20 DIAGNOSIS — D6959 Other secondary thrombocytopenia: Secondary | ICD-10-CM | POA: Diagnosis present

## 2021-09-20 DIAGNOSIS — Z952 Presence of prosthetic heart valve: Secondary | ICD-10-CM

## 2021-09-20 DIAGNOSIS — R571 Hypovolemic shock: Secondary | ICD-10-CM | POA: Diagnosis not present

## 2021-09-20 DIAGNOSIS — R739 Hyperglycemia, unspecified: Secondary | ICD-10-CM

## 2021-09-20 DIAGNOSIS — Z885 Allergy status to narcotic agent status: Secondary | ICD-10-CM

## 2021-09-20 DIAGNOSIS — R110 Nausea: Secondary | ICD-10-CM | POA: Diagnosis not present

## 2021-09-20 DIAGNOSIS — R579 Shock, unspecified: Secondary | ICD-10-CM

## 2021-09-20 DIAGNOSIS — R578 Other shock: Secondary | ICD-10-CM | POA: Diagnosis not present

## 2021-09-20 DIAGNOSIS — R0902 Hypoxemia: Secondary | ICD-10-CM | POA: Diagnosis present

## 2021-09-20 DIAGNOSIS — Z79899 Other long term (current) drug therapy: Secondary | ICD-10-CM

## 2021-09-20 DIAGNOSIS — I9589 Other hypotension: Secondary | ICD-10-CM | POA: Diagnosis not present

## 2021-09-20 DIAGNOSIS — Z85828 Personal history of other malignant neoplasm of skin: Secondary | ICD-10-CM | POA: Diagnosis not present

## 2021-09-20 DIAGNOSIS — J449 Chronic obstructive pulmonary disease, unspecified: Secondary | ICD-10-CM | POA: Diagnosis not present

## 2021-09-20 DIAGNOSIS — Z955 Presence of coronary angioplasty implant and graft: Secondary | ICD-10-CM | POA: Diagnosis not present

## 2021-09-20 DIAGNOSIS — I110 Hypertensive heart disease with heart failure: Secondary | ICD-10-CM | POA: Diagnosis not present

## 2021-09-20 DIAGNOSIS — N261 Atrophy of kidney (terminal): Secondary | ICD-10-CM | POA: Diagnosis present

## 2021-09-20 DIAGNOSIS — I5042 Chronic combined systolic (congestive) and diastolic (congestive) heart failure: Secondary | ICD-10-CM | POA: Diagnosis not present

## 2021-09-20 DIAGNOSIS — R9431 Abnormal electrocardiogram [ECG] [EKG]: Secondary | ICD-10-CM

## 2021-09-20 DIAGNOSIS — Z888 Allergy status to other drugs, medicaments and biological substances status: Secondary | ICD-10-CM | POA: Diagnosis not present

## 2021-09-20 DIAGNOSIS — E875 Hyperkalemia: Secondary | ICD-10-CM | POA: Diagnosis present

## 2021-09-20 DIAGNOSIS — I2510 Atherosclerotic heart disease of native coronary artery without angina pectoris: Secondary | ICD-10-CM | POA: Diagnosis present

## 2021-09-20 DIAGNOSIS — I708 Atherosclerosis of other arteries: Secondary | ICD-10-CM | POA: Diagnosis present

## 2021-09-20 DIAGNOSIS — Z880 Allergy status to penicillin: Secondary | ICD-10-CM

## 2021-09-20 DIAGNOSIS — D62 Acute posthemorrhagic anemia: Secondary | ICD-10-CM

## 2021-09-20 DIAGNOSIS — R310 Gross hematuria: Secondary | ICD-10-CM | POA: Diagnosis present

## 2021-09-20 DIAGNOSIS — Z902 Acquired absence of lung [part of]: Secondary | ICD-10-CM

## 2021-09-20 DIAGNOSIS — Z7902 Long term (current) use of antithrombotics/antiplatelets: Secondary | ICD-10-CM

## 2021-09-20 DIAGNOSIS — I7143 Infrarenal abdominal aortic aneurysm, without rupture: Principal | ICD-10-CM | POA: Diagnosis present

## 2021-09-20 DIAGNOSIS — D696 Thrombocytopenia, unspecified: Secondary | ICD-10-CM

## 2021-09-20 DIAGNOSIS — Z20822 Contact with and (suspected) exposure to covid-19: Secondary | ICD-10-CM | POA: Diagnosis not present

## 2021-09-20 DIAGNOSIS — Z87891 Personal history of nicotine dependence: Secondary | ICD-10-CM

## 2021-09-20 DIAGNOSIS — Z85118 Personal history of other malignant neoplasm of bronchus and lung: Secondary | ICD-10-CM

## 2021-09-20 DIAGNOSIS — E7800 Pure hypercholesterolemia, unspecified: Secondary | ICD-10-CM | POA: Diagnosis present

## 2021-09-20 HISTORY — PX: AORTA - BILATERAL FEMORAL ARTERY BYPASS GRAFT: SHX1175

## 2021-09-20 LAB — POCT I-STAT 7, (LYTES, BLD GAS, ICA,H+H)
Acid-base deficit: 2 mmol/L (ref 0.0–2.0)
Acid-base deficit: 4 mmol/L — ABNORMAL HIGH (ref 0.0–2.0)
Acid-base deficit: 3 mmol/L — ABNORMAL HIGH (ref 0.0–2.0)
Bicarbonate: 23.9 mmol/L (ref 20.0–28.0)
Bicarbonate: 21.5 mmol/L (ref 20.0–28.0)
Bicarbonate: 22.6 mmol/L (ref 20.0–28.0)
Calcium, Ion: 1.15 mmol/L (ref 1.15–1.40)
Calcium, Ion: 0.95 mmol/L — ABNORMAL LOW (ref 1.15–1.40)
Calcium, Ion: 0.94 mmol/L — ABNORMAL LOW (ref 1.15–1.40)
HCT: 29 % — ABNORMAL LOW (ref 36.0–46.0)
HCT: 37 % (ref 36.0–46.0)
HCT: 35 % — ABNORMAL LOW (ref 36.0–46.0)
Hemoglobin: 9.9 g/dL — ABNORMAL LOW (ref 12.0–15.0)
Hemoglobin: 12.6 g/dL (ref 12.0–15.0)
Hemoglobin: 11.9 g/dL — ABNORMAL LOW (ref 12.0–15.0)
O2 Saturation: 100 %
O2 Saturation: 100 %
O2 Saturation: 100 %
Patient temperature: 36.4
Patient temperature: 36.1
Patient temperature: 36.1
Potassium: 4.3 mmol/L (ref 3.5–5.1)
Potassium: 5.4 mmol/L — ABNORMAL HIGH (ref 3.5–5.1)
Potassium: 5 mmol/L (ref 3.5–5.1)
Sodium: 141 mmol/L (ref 135–145)
Sodium: 140 mmol/L (ref 135–145)
Sodium: 140 mmol/L (ref 135–145)
TCO2: 25 mmol/L (ref 22–32)
TCO2: 23 mmol/L (ref 22–32)
TCO2: 24 mmol/L (ref 22–32)
pCO2 arterial: 43.8 mmHg (ref 32–48)
pCO2 arterial: 40.1 mmHg (ref 32–48)
pCO2 arterial: 38.1 mmHg (ref 32–48)
pH, Arterial: 7.342 — ABNORMAL LOW (ref 7.35–7.45)
pH, Arterial: 7.333 — ABNORMAL LOW (ref 7.35–7.45)
pH, Arterial: 7.376 (ref 7.35–7.45)
pO2, Arterial: 215 mmHg — ABNORMAL HIGH (ref 83–108)
pO2, Arterial: 246 mmHg — ABNORMAL HIGH (ref 83–108)
pO2, Arterial: 210 mmHg — ABNORMAL HIGH (ref 83–108)

## 2021-09-20 LAB — CBC
HCT: 37.1 % (ref 36.0–46.0)
HCT: 37.1 % (ref 36.0–46.0)
Hemoglobin: 12.5 g/dL (ref 12.0–15.0)
Hemoglobin: 12.5 g/dL (ref 12.0–15.0)
MCH: 30.9 pg (ref 26.0–34.0)
MCH: 30.6 pg (ref 26.0–34.0)
MCHC: 33.7 g/dL (ref 30.0–36.0)
MCHC: 33.7 g/dL (ref 30.0–36.0)
MCV: 91.8 fL (ref 80.0–100.0)
MCV: 90.9 fL (ref 80.0–100.0)
Platelets: 119 10*3/uL — ABNORMAL LOW (ref 150–400)
Platelets: 142 10*3/uL — ABNORMAL LOW (ref 150–400)
RBC: 4.04 MIL/uL (ref 3.87–5.11)
RBC: 4.08 MIL/uL (ref 3.87–5.11)
RDW: 13.5 % (ref 11.5–15.5)
RDW: 13.8 % (ref 11.5–15.5)
WBC: 9.7 10*3/uL (ref 4.0–10.5)
WBC: 9.1 10*3/uL (ref 4.0–10.5)
nRBC: 0 % (ref 0.0–0.2)
nRBC: 0 % (ref 0.0–0.2)

## 2021-09-20 LAB — ECHOCARDIOGRAM COMPLETE
AR max vel: 1.24 cm2
AV Area VTI: 1.21 cm2
AV Area mean vel: 1.23 cm2
AV Mean grad: 16 mmHg
AV Peak grad: 30.9 mmHg
Ao pk vel: 2.78 m/s
Area-P 1/2: 2.76 cm2
Height: 59 in
S' Lateral: 2.9 cm
Weight: 2048 oz

## 2021-09-20 LAB — GLUCOSE, CAPILLARY
Glucose-Capillary: 153 mg/dL — ABNORMAL HIGH (ref 70–99)
Glucose-Capillary: 133 mg/dL — ABNORMAL HIGH (ref 70–99)
Glucose-Capillary: 117 mg/dL — ABNORMAL HIGH (ref 70–99)

## 2021-09-20 LAB — BASIC METABOLIC PANEL
Anion gap: 10 (ref 5–15)
Anion gap: 10 (ref 5–15)
BUN: 22 mg/dL (ref 8–23)
BUN: 23 mg/dL (ref 8–23)
CO2: 20 mmol/L — ABNORMAL LOW (ref 22–32)
CO2: 21 mmol/L — ABNORMAL LOW (ref 22–32)
Calcium: 7.4 mg/dL — ABNORMAL LOW (ref 8.9–10.3)
Calcium: 7.4 mg/dL — ABNORMAL LOW (ref 8.9–10.3)
Chloride: 109 mmol/L (ref 98–111)
Chloride: 110 mmol/L (ref 98–111)
Creatinine, Ser: 0.94 mg/dL (ref 0.44–1.00)
Creatinine, Ser: 1.06 mg/dL — ABNORMAL HIGH (ref 0.44–1.00)
GFR, Estimated: >60 mL/min (ref >60)
GFR, Estimated: 57 mL/min — ABNORMAL LOW (ref >60)
Glucose, Bld: 156 mg/dL — ABNORMAL HIGH (ref 70–99)
Glucose, Bld: 149 mg/dL — ABNORMAL HIGH (ref 70–99)
Potassium: 5.6 mmol/L — ABNORMAL HIGH (ref 3.5–5.1)
Potassium: 5.2 mmol/L — ABNORMAL HIGH (ref 3.5–5.1)
Sodium: 139 mmol/L (ref 135–145)
Sodium: 141 mmol/L (ref 135–145)

## 2021-09-20 LAB — PROTIME-INR
INR: 1.3 — ABNORMAL HIGH (ref 0.8–1.2)
Prothrombin Time: 15.8 seconds — ABNORMAL HIGH (ref 11.4–15.2)

## 2021-09-20 LAB — POCT ACTIVATED CLOTTING TIME
Activated Clotting Time: 233 seconds
Activated Clotting Time: 275 seconds
Activated Clotting Time: 203 seconds
Activated Clotting Time: 257 seconds
Activated Clotting Time: 263 seconds
Activated Clotting Time: 0 seconds
Activated Clotting Time: 89 seconds

## 2021-09-20 LAB — HEMOGLOBIN A1C
Hgb A1c MFr Bld: 5.5 % (ref 4.8–5.6)
Mean Plasma Glucose: 111.15 mg/dL

## 2021-09-20 LAB — APTT: aPTT: 26 seconds (ref 24–36)

## 2021-09-20 LAB — FIBRINOGEN: Fibrinogen: 296 mg/dL (ref 210–475)

## 2021-09-20 LAB — MAGNESIUM: Magnesium: 1.7 mg/dL (ref 1.7–2.4)

## 2021-09-20 LAB — TROPONIN I (HIGH SENSITIVITY): Troponin I (High Sensitivity): 7 ng/L (ref <18)

## 2021-09-20 LAB — LACTIC ACID, PLASMA: Lactic Acid, Venous: 1.7 mmol/L (ref 0.5–1.9)

## 2021-09-20 LAB — PREPARE RBC (CROSSMATCH)

## 2021-09-20 SURGERY — CREATION, BYPASS, ARTERIAL, AORTA TO FEMORAL, BILATERAL, USING GRAFT
Anesthesia: Epidural | Site: Abdomen | Laterality: Bilateral

## 2021-09-20 MED ORDER — ONDANSETRON HCL 4 MG/2ML IJ SOLN
4.0000 mg | Freq: Four times a day (QID) | INTRAMUSCULAR | Status: DC | PRN
  Administered 2021-09-21 – 2021-09-26 (×7): 4 mg via INTRAVENOUS
  Filled 2021-09-20 (×11): qty 2

## 2021-09-20 MED ORDER — UMECLIDINIUM BROMIDE 62.5 MCG/ACT IN AEPB
1.0000 | INHALATION_SPRAY | Freq: Every day | RESPIRATORY_TRACT | Status: DC
  Filled 2021-09-20: qty 7

## 2021-09-20 MED ORDER — PROTAMINE SULFATE 10 MG/ML IV SOLN
INTRAVENOUS | Status: AC
Start: 2021-09-20 — End: ?
  Filled 2021-09-20: qty 5

## 2021-09-20 MED ORDER — HEPARIN SODIUM (PORCINE) 5000 UNIT/ML IJ SOLN: 5000.0000 [IU] | Freq: Three times a day (TID) | INTRAMUSCULAR | Status: DC

## 2021-09-20 MED ORDER — CLEVIDIPINE BUTYRATE 0.5 MG/ML IV EMUL
INTRAVENOUS | Status: AC
Start: 2021-09-20 — End: ?
  Filled 2021-09-20: qty 50

## 2021-09-20 MED ORDER — POTASSIUM CHLORIDE CRYS ER 20 MEQ PO TBCR: 20.0000 meq | EXTENDED_RELEASE_TABLET | Freq: Once | ORAL | Status: DC | PRN

## 2021-09-20 MED ORDER — ROCURONIUM BROMIDE 10 MG/ML (PF) SYRINGE
PREFILLED_SYRINGE | INTRAVENOUS | Status: DC | PRN
  Administered 2021-09-20 (×3): 20 mg via INTRAVENOUS
  Administered 2021-09-20: 60 mg via INTRAVENOUS
  Administered 2021-09-20: 10 mg via INTRAVENOUS

## 2021-09-20 MED ORDER — CHLORHEXIDINE GLUCONATE CLOTH 2 % EX PADS: 6.0000 | MEDICATED_PAD | Freq: Once | CUTANEOUS | Status: DC

## 2021-09-20 MED ORDER — LACTATED RINGERS IV SOLN: INTRAVENOUS | Status: DC

## 2021-09-20 MED ORDER — SODIUM CHLORIDE 0.9 % IV SOLN
250.0000 mL | INTRAVENOUS | Status: DC
  Administered 2021-09-20 – 2021-09-23 (×2): 250 mL via INTRAVENOUS

## 2021-09-20 MED ORDER — VANCOMYCIN HCL IN DEXTROSE 1-5 GM/200ML-% IV SOLN
1000.0000 mg | Freq: Two times a day (BID) | INTRAVENOUS | Status: AC
  Administered 2021-09-20 – 2021-09-21 (×2): 1000 mg via INTRAVENOUS
  Filled 2021-09-20 (×2): qty 200

## 2021-09-20 MED ORDER — PHENYLEPHRINE HCL-NACL 20-0.9 MG/250ML-% IV SOLN
INTRAVENOUS | Status: DC | PRN
  Administered 2021-09-20: 40 ug/min via INTRAVENOUS
  Administered 2021-09-20: 60 ug/min via INTRAVENOUS

## 2021-09-20 MED ORDER — HEPARIN SODIUM (PORCINE) 1000 UNIT/ML IJ SOLN
INTRAMUSCULAR | Status: DC | PRN
  Administered 2021-09-20 (×2): 3000 [IU] via INTRAVENOUS
  Administered 2021-09-20: 6000 [IU] via INTRAVENOUS

## 2021-09-20 MED ORDER — ORAL CARE MOUTH RINSE: 15.0000 mL | Freq: Once | OROMUCOSAL | Status: AC

## 2021-09-20 MED ORDER — LACTATED RINGERS IV SOLN: INTRAVENOUS | Status: DC | PRN

## 2021-09-20 MED ORDER — PERFLUTREN LIPID MICROSPHERE
1.0000 mL | INTRAVENOUS | Status: AC | PRN
  Administered 2021-09-20: 3 mL via INTRAVENOUS

## 2021-09-20 MED ORDER — HEPARIN 6000 UNIT IRRIGATION SOLUTION
Status: AC
  Filled 2021-09-20: qty 500

## 2021-09-20 MED ORDER — LACTATED RINGERS IV BOLUS
500.0000 mL | Freq: Once | INTRAVENOUS | Status: AC
  Administered 2021-09-20: 500 mL via INTRAVENOUS

## 2021-09-20 MED ORDER — FLUTICASONE FUROATE-VILANTEROL 200-25 MCG/ACT IN AEPB
1.0000 | INHALATION_SPRAY | Freq: Every day | RESPIRATORY_TRACT | Status: DC
  Filled 2021-09-20: qty 28

## 2021-09-20 MED ORDER — ROCURONIUM BROMIDE 10 MG/ML (PF) SYRINGE
PREFILLED_SYRINGE | INTRAVENOUS | Status: AC
Start: 2021-09-20 — End: ?
  Filled 2021-09-20: qty 10

## 2021-09-20 MED ORDER — ALBUMIN HUMAN 5 % IV SOLN: INTRAVENOUS | Status: DC | PRN

## 2021-09-20 MED ORDER — ACETAMINOPHEN 325 MG PO TABS
325.0000 mg | ORAL_TABLET | ORAL | Status: DC | PRN
  Administered 2021-09-21: 650 mg via ORAL
  Filled 2021-09-20: qty 2

## 2021-09-20 MED ORDER — LIDOCAINE 2% (20 MG/ML) 5 ML SYRINGE
INTRAMUSCULAR | Status: AC
  Filled 2021-09-20: qty 5

## 2021-09-20 MED ORDER — PROPOFOL 1000 MG/100ML IV EMUL
INTRAVENOUS | Status: AC
Start: 2021-09-20 — End: ?
  Filled 2021-09-20: qty 100

## 2021-09-20 MED ORDER — SODIUM CHLORIDE 0.9 % IV SOLN: INTRAVENOUS | Status: DC

## 2021-09-20 MED ORDER — SODIUM ZIRCONIUM CYCLOSILICATE 10 G PO PACK
10.0000 g | PACK | Freq: Once | ORAL | Status: AC
Start: 2021-09-20 — End: 2021-09-20
  Administered 2021-09-20: 10 g via ORAL
  Filled 2021-09-20: qty 1

## 2021-09-20 MED ORDER — ROPIVACAINE HCL 2 MG/ML IJ SOLN
10.0000 mL/h | INTRAMUSCULAR | Status: DC
  Administered 2021-09-21 – 2021-09-23 (×4): 10 mL/h via EPIDURAL
  Filled 2021-09-20: qty 20
  Filled 2021-09-20 (×2): qty 200
  Filled 2021-09-20 (×2): qty 20
  Filled 2021-09-20 (×2): qty 200
  Filled 2021-09-20 (×3): qty 20
  Filled 2021-09-20 (×2): qty 200
  Filled 2021-09-20: qty 20
  Filled 2021-09-20: qty 200

## 2021-09-20 MED ORDER — LACTATED RINGERS IV SOLN
INTRAVENOUS | Status: DC
Start: 2021-09-20 — End: 2021-09-23

## 2021-09-20 MED ORDER — MAGNESIUM SULFATE 2 GM/50ML IV SOLN: 2.0000 g | Freq: Once | INTRAVENOUS | Status: DC | PRN

## 2021-09-20 MED ORDER — MIDAZOLAM HCL 2 MG/2ML IJ SOLN
INTRAMUSCULAR | Status: DC | PRN
  Administered 2021-09-20 (×2): 1 mg via INTRAVENOUS

## 2021-09-20 MED ORDER — HYDROMORPHONE HCL 1 MG/ML IJ SOLN
0.5000 mg | INTRAMUSCULAR | Status: DC | PRN
  Administered 2021-09-21: 0.5 mg via INTRAVENOUS
  Administered 2021-09-21 (×2): 1 mg via INTRAVENOUS
  Administered 2021-09-21: 0.5 mg via INTRAVENOUS
  Administered 2021-09-21: 1 mg via INTRAVENOUS
  Administered 2021-09-22: 0.5 mg via INTRAVENOUS
  Administered 2021-09-22: 1 mg via INTRAVENOUS
  Filled 2021-09-20 (×7): qty 1

## 2021-09-20 MED ORDER — GLYCOPYRROLATE PF 0.2 MG/ML IJ SOSY
PREFILLED_SYRINGE | INTRAMUSCULAR | Status: AC
  Filled 2021-09-20: qty 1

## 2021-09-20 MED ORDER — INSULIN ASPART 100 UNIT/ML IJ SOLN
0.0000 [IU] | Freq: Three times a day (TID) | INTRAMUSCULAR | Status: DC
  Administered 2021-09-20 – 2021-09-21 (×2): 2 [IU] via SUBCUTANEOUS
  Administered 2021-09-22: 3 [IU] via SUBCUTANEOUS
  Administered 2021-09-24: 2 [IU] via SUBCUTANEOUS

## 2021-09-20 MED ORDER — FLUTICASONE FUROATE-VILANTEROL 100-25 MCG/ACT IN AEPB
1.0000 | INHALATION_SPRAY | Freq: Every day | RESPIRATORY_TRACT | Status: DC
  Administered 2021-09-21 – 2021-09-27 (×6): 1 via RESPIRATORY_TRACT
  Filled 2021-09-20 (×3): qty 28

## 2021-09-20 MED ORDER — METOPROLOL TARTRATE 5 MG/5ML IV SOLN
2.0000 mg | INTRAVENOUS | Status: DC | PRN
  Administered 2021-09-25: 2 mg via INTRAVENOUS
  Filled 2021-09-20: qty 5

## 2021-09-20 MED ORDER — PROPOFOL 10 MG/ML IV BOLUS
INTRAVENOUS | Status: AC
  Filled 2021-09-20: qty 20

## 2021-09-20 MED ORDER — FENTANYL CITRATE (PF) 250 MCG/5ML IJ SOLN
INTRAMUSCULAR | Status: AC
  Filled 2021-09-20: qty 5

## 2021-09-20 MED ORDER — PROPOFOL 10 MG/ML IV BOLUS
INTRAVENOUS | Status: DC | PRN
  Administered 2021-09-20: 80 mg via INTRAVENOUS

## 2021-09-20 MED ORDER — ONDANSETRON HCL 4 MG/2ML IJ SOLN
INTRAMUSCULAR | Status: AC
Start: 2021-09-20 — End: ?
  Filled 2021-09-20: qty 2

## 2021-09-20 MED ORDER — PROTAMINE SULFATE 10 MG/ML IV SOLN
INTRAVENOUS | Status: DC | PRN
  Administered 2021-09-20: 50 mg via INTRAVENOUS

## 2021-09-20 MED ORDER — PHENYLEPHRINE HCL (PRESSORS) 10 MG/ML IV SOLN
INTRAVENOUS | Status: DC | PRN
  Administered 2021-09-20: 80 ug via INTRAVENOUS
  Administered 2021-09-20: 200 ug via INTRAVENOUS
  Administered 2021-09-20: 40 ug via INTRAVENOUS
  Administered 2021-09-20 (×3): 80 ug via INTRAVENOUS
  Administered 2021-09-20: 40 ug via INTRAVENOUS
  Administered 2021-09-20 (×3): 80 ug via INTRAVENOUS

## 2021-09-20 MED ORDER — NITROGLYCERIN 0.4 MG SL SUBL
0.4000 mg | SUBLINGUAL_TABLET | SUBLINGUAL | Status: DC | PRN
Start: 2021-09-20 — End: 2021-09-27

## 2021-09-20 MED ORDER — MIDAZOLAM HCL 2 MG/2ML IJ SOLN
INTRAMUSCULAR | Status: AC
Start: 2021-09-20 — End: ?
  Filled 2021-09-20: qty 2

## 2021-09-20 MED ORDER — ROPIVACAINE HCL 2 MG/ML IJ SOLN
8.0000 mL/h | INTRAMUSCULAR | Status: DC
  Administered 2021-09-20: 8 mL/h via EPIDURAL
  Filled 2021-09-20: qty 200

## 2021-09-20 MED ORDER — SODIUM CHLORIDE 0.9 % IV SOLN: 500.0000 mL | Freq: Once | INTRAVENOUS | Status: DC | PRN

## 2021-09-20 MED ORDER — HYDRALAZINE HCL 20 MG/ML IJ SOLN: 5.0000 mg | INTRAMUSCULAR | Status: DC | PRN

## 2021-09-20 MED ORDER — HEMOSTATIC AGENTS (NO CHARGE) OPTIME
TOPICAL | Status: DC | PRN
  Administered 2021-09-20 (×3): 1 via TOPICAL

## 2021-09-20 MED ORDER — NITROGLYCERIN IN D5W 200-5 MCG/ML-% IV SOLN
INTRAVENOUS | Status: AC
Start: 2021-09-20 — End: ?
  Filled 2021-09-20: qty 250

## 2021-09-20 MED ORDER — VANCOMYCIN HCL IN DEXTROSE 1-5 GM/200ML-% IV SOLN
INTRAVENOUS | Status: AC
  Administered 2021-09-20: 1000 mg via INTRAVENOUS
  Filled 2021-09-20: qty 200

## 2021-09-20 MED ORDER — UMECLIDINIUM BROMIDE 62.5 MCG/ACT IN AEPB
1.0000 | INHALATION_SPRAY | Freq: Every day | RESPIRATORY_TRACT | Status: DC
  Administered 2021-09-21 – 2021-09-27 (×7): 1 via RESPIRATORY_TRACT

## 2021-09-20 MED ORDER — ACETAMINOPHEN 325 MG RE SUPP: 325.0000 mg | RECTAL | Status: DC | PRN

## 2021-09-20 MED ORDER — GUAIFENESIN-DM 100-10 MG/5ML PO SYRP
15.0000 mL | ORAL_SOLUTION | ORAL | Status: DC | PRN
  Administered 2021-09-23 – 2021-09-24 (×2): 15 mL via ORAL
  Filled 2021-09-20 (×2): qty 15

## 2021-09-20 MED ORDER — PHENYLEPHRINE HCL-NACL 20-0.9 MG/250ML-% IV SOLN
INTRAVENOUS | Status: AC
Start: 2021-09-20 — End: ?
  Filled 2021-09-20: qty 250

## 2021-09-20 MED ORDER — GLYCOPYRROLATE PF 0.2 MG/ML IJ SOSY
PREFILLED_SYRINGE | INTRAMUSCULAR | Status: DC | PRN
  Administered 2021-09-20: .1 mg via INTRAVENOUS

## 2021-09-20 MED ORDER — BISACODYL 10 MG RE SUPP: 10.0000 mg | Freq: Every day | RECTAL | Status: DC | PRN

## 2021-09-20 MED ORDER — ONDANSETRON HCL 4 MG/2ML IJ SOLN
INTRAMUSCULAR | Status: DC | PRN
  Administered 2021-09-20: 4 mg via INTRAVENOUS

## 2021-09-20 MED ORDER — ALBUTEROL SULFATE (2.5 MG/3ML) 0.083% IN NEBU
2.5000 mg | INHALATION_SOLUTION | Freq: Four times a day (QID) | RESPIRATORY_TRACT | Status: DC | PRN
Start: 2021-09-20 — End: 2021-09-27

## 2021-09-20 MED ORDER — SODIUM CHLORIDE 0.9% IV SOLUTION: Freq: Once | INTRAVENOUS | Status: DC

## 2021-09-20 MED ORDER — PHENYLEPHRINE 80 MCG/ML (10ML) SYRINGE FOR IV PUSH (FOR BLOOD PRESSURE SUPPORT)
PREFILLED_SYRINGE | INTRAVENOUS | Status: AC
  Filled 2021-09-20: qty 10

## 2021-09-20 MED ORDER — CALCIUM GLUCONATE-NACL 1-0.675 GM/50ML-% IV SOLN
1.0000 g | Freq: Once | INTRAVENOUS | Status: AC
  Administered 2021-09-20: 1000 mg via INTRAVENOUS
  Filled 2021-09-20 (×2): qty 50

## 2021-09-20 MED ORDER — DEXAMETHASONE SODIUM PHOSPHATE 10 MG/ML IJ SOLN
INTRAMUSCULAR | Status: AC
  Filled 2021-09-20: qty 1

## 2021-09-20 MED ORDER — VANCOMYCIN HCL IN DEXTROSE 1-5 GM/200ML-% IV SOLN: 1000.0000 mg | INTRAVENOUS | Status: AC

## 2021-09-20 MED ORDER — LIDOCAINE 2% (20 MG/ML) 5 ML SYRINGE
INTRAMUSCULAR | Status: DC | PRN
  Administered 2021-09-20: 60 mg via INTRAVENOUS

## 2021-09-20 MED ORDER — HEPARIN 6000 UNIT IRRIGATION SOLUTION
Status: DC | PRN
  Administered 2021-09-20: 1

## 2021-09-20 MED ORDER — HEPARIN SODIUM (PORCINE) 1000 UNIT/ML IJ SOLN
INTRAMUSCULAR | Status: AC
  Filled 2021-09-20: qty 20

## 2021-09-20 MED ORDER — FENTANYL CITRATE (PF) 100 MCG/2ML IJ SOLN: 25.0000 ug | INTRAMUSCULAR | Status: DC | PRN

## 2021-09-20 MED ORDER — PANTOPRAZOLE SODIUM 40 MG IV SOLR
40.0000 mg | INTRAVENOUS | Status: DC
  Administered 2021-09-20 – 2021-09-24 (×5): 40 mg via INTRAVENOUS
  Filled 2021-09-20 (×5): qty 10

## 2021-09-20 MED ORDER — PANTOPRAZOLE SODIUM 40 MG IV SOLR: 40.0000 mg | Freq: Every day | INTRAVENOUS | Status: DC

## 2021-09-20 MED ORDER — POLYVINYL ALCOHOL 1.4 % OP SOLN: 2.0000 [drp] | Freq: Four times a day (QID) | OPHTHALMIC | Status: DC | PRN

## 2021-09-20 MED ORDER — CHLORHEXIDINE GLUCONATE 0.12 % MT SOLN: 15.0000 mL | Freq: Once | OROMUCOSAL | Status: AC

## 2021-09-20 MED ORDER — DEXAMETHASONE SODIUM PHOSPHATE 10 MG/ML IJ SOLN
INTRAMUSCULAR | Status: DC | PRN
  Administered 2021-09-20: 8 mg via INTRAVENOUS

## 2021-09-20 MED ORDER — LABETALOL HCL 5 MG/ML IV SOLN: 10.0000 mg | INTRAVENOUS | Status: DC | PRN

## 2021-09-20 MED ORDER — PHENOL 1.4 % MT LIQD
1.0000 | OROMUCOSAL | Status: DC | PRN
Start: 2021-09-20 — End: 2021-09-27

## 2021-09-20 MED ORDER — ROCURONIUM BROMIDE 10 MG/ML (PF) SYRINGE
PREFILLED_SYRINGE | INTRAVENOUS | Status: AC
  Filled 2021-09-20: qty 10

## 2021-09-20 MED ORDER — FENTANYL CITRATE (PF) 100 MCG/2ML IJ SOLN
INTRAMUSCULAR | Status: DC | PRN
  Administered 2021-09-20 (×3): 50 ug via INTRAVENOUS

## 2021-09-20 MED ORDER — CHLORHEXIDINE GLUCONATE CLOTH 2 % EX PADS
6.0000 | MEDICATED_PAD | Freq: Every day | CUTANEOUS | Status: DC
Start: 2021-09-21 — End: 2021-09-27
  Administered 2021-09-21 – 2021-09-27 (×7): 6 via TOPICAL

## 2021-09-20 MED ORDER — 0.9 % SODIUM CHLORIDE (POUR BTL) OPTIME
TOPICAL | Status: DC | PRN
  Administered 2021-09-20: 2000 mL

## 2021-09-20 MED ORDER — PHENYLEPHRINE HCL-NACL 20-0.9 MG/250ML-% IV SOLN
25.0000 ug/min | INTRAVENOUS | Status: DC
  Administered 2021-09-21 (×2): 60 ug/min via INTRAVENOUS
  Administered 2021-09-21: 70 ug/min via INTRAVENOUS
  Administered 2021-09-21: 40 ug/min via INTRAVENOUS
  Administered 2021-09-22: 60 ug/min via INTRAVENOUS
  Administered 2021-09-22: 30 ug/min via INTRAVENOUS
  Administered 2021-09-23: 15 ug/min via INTRAVENOUS
  Filled 2021-09-20 (×9): qty 250

## 2021-09-20 MED ORDER — DOCUSATE SODIUM 100 MG PO CAPS
100.0000 mg | ORAL_CAPSULE | Freq: Every day | ORAL | Status: DC
  Administered 2021-09-21 – 2021-09-24 (×4): 100 mg via ORAL
  Filled 2021-09-20 (×6): qty 1

## 2021-09-20 MED ORDER — SUGAMMADEX SODIUM 200 MG/2ML IV SOLN
INTRAVENOUS | Status: DC | PRN
  Administered 2021-09-20: 250 mg via INTRAVENOUS

## 2021-09-20 MED ORDER — CHLORHEXIDINE GLUCONATE 0.12 % MT SOLN
OROMUCOSAL | Status: AC
  Administered 2021-09-20: 15 mL via OROMUCOSAL
  Filled 2021-09-20: qty 15

## 2021-09-20 MED ORDER — ALBUTEROL SULFATE HFA 108 (90 BASE) MCG/ACT IN AERS: 2.0000 | INHALATION_SPRAY | Freq: Four times a day (QID) | RESPIRATORY_TRACT | Status: DC | PRN

## 2021-09-20 MED ORDER — MANNITOL 20 % IV SOLN
INTRAVENOUS | Status: AC
  Filled 2021-09-20: qty 500

## 2021-09-20 MED ORDER — APREPITANT 40 MG PO CAPS
40.0000 mg | ORAL_CAPSULE | Freq: Once | ORAL | Status: AC
  Administered 2021-09-20: 40 mg via ORAL
  Filled 2021-09-20: qty 1

## 2021-09-20 SURGICAL SUPPLY — 73 items
ADH SKN CLS APL DERMABOND .7 (GAUZE/BANDAGES/DRESSINGS) ×6
APL PRP STRL LF DISP 70% ISPRP (MISCELLANEOUS) ×2
BAG COUNTER SPONGE SURGICOUNT (BAG) ×3 IMPLANT
BAG ISL DRAPE 18X18 STRL (DRAPES) ×2
BAG ISOLATION DRAPE 18X18 (DRAPES) IMPLANT
BAG SPNG CNTER NS LX DISP (BAG) ×1
CANISTER SUCT 3000ML PPV (MISCELLANEOUS) ×3 IMPLANT
CANNULA VESSEL 3MM 2 BLNT TIP (CANNULA) ×2 IMPLANT
CATH FOLEY LATEX FREE 16FR (CATHETERS) ×4
CATH FOLEY LF 16FR (CATHETERS) IMPLANT
CATH ROBINSON RED A/P 18FR (CATHETERS) ×6 IMPLANT
CHLORAPREP W/TINT 26 (MISCELLANEOUS) ×6 IMPLANT
CLIP LIGATING EXTRA MED SLVR (CLIP) ×1 IMPLANT
CLIP LIGATING EXTRA SM BLUE (MISCELLANEOUS) ×1 IMPLANT
COVER PROBE W GEL 5X96 (DRAPES) ×4 IMPLANT
COVER SURGICAL LIGHT HANDLE (MISCELLANEOUS) ×1 IMPLANT
DERMABOND ADVANCED (GAUZE/BANDAGES/DRESSINGS) ×6
DERMABOND ADVANCED .7 DNX12 (GAUZE/BANDAGES/DRESSINGS) ×4 IMPLANT
DRAPE ISOLATION BAG 18X18 (DRAPES) ×4
ELECT BLADE 4.0 EZ CLEAN MEGAD (MISCELLANEOUS) ×2
ELECT BLADE 6.5 EXT (BLADE) IMPLANT
ELECT REM PT RETURN 9FT ADLT (ELECTROSURGICAL) ×2
ELECTRODE BLDE 4.0 EZ CLN MEGD (MISCELLANEOUS) ×2 IMPLANT
ELECTRODE REM PT RTRN 9FT ADLT (ELECTROSURGICAL) ×2 IMPLANT
FELT TEFLON 1X6 (MISCELLANEOUS) ×1 IMPLANT
GLOVE BIO SURGEON STRL SZ8 (GLOVE) ×3 IMPLANT
GLOVE SURG SS PI 6.5 STRL IVOR (GLOVE) ×4 IMPLANT
GLOVE SURG SS PI 7.0 STRL IVOR (GLOVE) ×2 IMPLANT
GOWN STRL REUS W/ TWL LRG LVL3 (GOWN DISPOSABLE) ×4 IMPLANT
GOWN STRL REUS W/ TWL XL LVL3 (GOWN DISPOSABLE) ×2 IMPLANT
GOWN STRL REUS W/TWL LRG LVL3 (GOWN DISPOSABLE) ×4
GOWN STRL REUS W/TWL XL LVL3 (GOWN DISPOSABLE) ×2
GRAFT HEMASHIELD 14X8MM (Vascular Products) ×1 IMPLANT
HEMOSTAT SNOW SURGICEL 2X4 (HEMOSTASIS) ×2 IMPLANT
INSERT FOGARTY 61MM (MISCELLANEOUS) IMPLANT
INSERT FOGARTY SM (MISCELLANEOUS) ×6 IMPLANT
KIT BASIN OR (CUSTOM PROCEDURE TRAY) ×3 IMPLANT
KIT TURNOVER KIT B (KITS) ×3 IMPLANT
NS IRRIG 1000ML POUR BTL (IV SOLUTION) ×6 IMPLANT
PACK AORTA (CUSTOM PROCEDURE TRAY) ×3 IMPLANT
PAD ARMBOARD 7.5X6 YLW CONV (MISCELLANEOUS) ×6 IMPLANT
PENCIL BUTTON HOLSTER BLD 10FT (ELECTRODE) ×1 IMPLANT
SPONGE T-LAP 18X18 ~~LOC~~+RFID (SPONGE) ×2 IMPLANT
SUT MNCRL AB 4-0 PS2 18 (SUTURE) ×9 IMPLANT
SUT PDS AB 1 TP1 54 (SUTURE) ×6 IMPLANT
SUT PROLENE 3 0 SH1 36 (SUTURE) ×6 IMPLANT
SUT PROLENE 4 0 RB 1 (SUTURE) ×6
SUT PROLENE 4 0 SH DA (SUTURE) ×6 IMPLANT
SUT PROLENE 4-0 RB1 .5 CRCL 36 (SUTURE) ×4 IMPLANT
SUT PROLENE 5 0 C 1 24 (SUTURE) ×9 IMPLANT
SUT PROLENE 5 0 C 1 36 (SUTURE) IMPLANT
SUT SILK 2 0 (SUTURE) ×2
SUT SILK 2 0 TIES 17X18 (SUTURE) ×2
SUT SILK 2 0SH CR/8 30 (SUTURE) ×3 IMPLANT
SUT SILK 2-0 18XBRD TIE 12 (SUTURE) ×2 IMPLANT
SUT SILK 2-0 18XBRD TIE BLK (SUTURE) ×2 IMPLANT
SUT SILK 3 0 (SUTURE) ×2
SUT SILK 3 0 TIES 17X18 (SUTURE) ×2
SUT SILK 3-0 18XBRD TIE 12 (SUTURE) ×2 IMPLANT
SUT SILK 3-0 18XBRD TIE BLK (SUTURE) ×2 IMPLANT
SUT VIC AB 0 CT1 18XCR BRD 8 (SUTURE) ×4 IMPLANT
SUT VIC AB 0 CT1 8-18 (SUTURE) ×4
SUT VIC AB 2-0 CT1 27 (SUTURE) ×6
SUT VIC AB 2-0 CT1 TAPERPNT 27 (SUTURE) ×4 IMPLANT
SUT VIC AB 3-0 SH 27 (SUTURE) ×10
SUT VIC AB 3-0 SH 27X BRD (SUTURE) IMPLANT
SYR BULB IRRIG 60ML STRL (SYRINGE) ×3 IMPLANT
TAPE UMBILICAL 1/8X30 (MISCELLANEOUS) ×4 IMPLANT
TOWEL GREEN STERILE (TOWEL DISPOSABLE) ×3 IMPLANT
TOWEL ~~LOC~~+RFID 17X26 BLUE (SPONGE) ×3 IMPLANT
TRAY FOLEY MTR SLVR 16FR STAT (SET/KITS/TRAYS/PACK) ×2 IMPLANT
TRAY FOLEY SLVR 16FR LF STAT (SET/KITS/TRAYS/PACK) ×1 IMPLANT
WATER STERILE IRR 1000ML POUR (IV SOLUTION) ×6 IMPLANT

## 2021-09-20 NOTE — Progress Notes (Signed)
This nurse spoke with unit nurse. Denied any needs at this time for further IV access. Instructed to consult IV team with any future needs. VU. Fran Lowes, RN VAST

## 2021-09-20 NOTE — Anesthesia Postprocedure Evaluation (Signed)
Anesthesia Post Note  Patient: Teresa [name]  Procedure(s) Performed: AORTA BIFEMORAL BYPASS GRAFT WITH EPIDURAL CATHETER and  aortic endartarectomey and bilateral femoral endartarectomies. (Bilateral: Abdomen)     Patient location during evaluation: PACU Anesthesia Type: Epidural and General Level of consciousness: awake and alert Pain management: pain level controlled Vital Signs Assessment: post-procedure vital signs reviewed and stable Respiratory status: spontaneous breathing, nonlabored ventilation, respiratory function stable and patient connected to nasal cannula oxygen Cardiovascular status: blood pressure returned to baseline and stable Postop Assessment: no apparent nausea or vomiting Anesthetic complications: no   No notable events documented.  Last Vitals:  Vitals:   09/20/21 1515 09/20/21 1530  BP: (!) 108/59 113/75  Pulse: (!) 53 (!) 50  Resp: 15 15  Temp:  (!) 36.3 C  SpO2: 100% 100%    Last Pain:  Vitals:   09/20/21 1515  TempSrc:   PainSc: 0-No pain                 Belenda Cruise P Quron Ruddy

## 2021-09-20 NOTE — Op Note (Signed)
DATE OF SERVICE: 09/20/2021  PATIENT:  Teresa [name]  71 y.o. female  PRE-OPERATIVE DIAGNOSIS:  small aortic aneurysm; aortoiliac occlusive disease  POST-OPERATIVE DIAGNOSIS:  Same  PROCEDURE:   1) infra-renal abdominal aortic endarterectomy 2) aorto-bi-femoral bypass (14 x 45mm Dacron) 3) bilateral common femoral endarterectomy  SURGEON:  Surgeon(s) and Role:    * [name], MD - Primary  ASSISTANT: Cassandria Santee, MD  Leontine Locket, PA-C  An experienced assistant was required given the complexity of this procedure and the standard of surgical care. My assistant helped with exposure through counter tension, suctioning, ligation and retraction to better visualize the surgical field.  My assistant expedited sewing during the case by following my sutures. Wherever I use the term "we" in the report, my assistant actively helped me with that portion of the procedure.  ANESTHESIA:   epidural and general  EBL: 2,525mL  Urine output: 326mL - brown at end of case, worrisome for ATN  BLOOD ADMINISTERED: 2 units PRBCs 1 unit FFP 1 unit cryoprecipitate 636mL cell saver  IV Fluids: 2L crystalloid 1L albumin Spent the worst ultrasound of the 1 DRAINS: none   LOCAL MEDICATIONS USED:  NONE  SPECIMEN:  none  COUNTS: confirmed correct.  TOURNIQUET:  none  PATIENT DISPOSITION:  ICU - extubated and stable.   Delay start of Pharmacological VTE agent (>24hrs) due to surgical blood loss or risk of bleeding: no  INDICATION FOR PROCEDURE: Teresa [name] is a 71 y.o. female with small infrarenal abdominal aortic aneurysm with critical bilateral common iliac artery stenosis causing disabling claudication.  A trial of medical therapy was performed.  The patient did not improve.  Patient is optimized from a medical standpoint.  Preoperative cardiac evaluation suggested no further work-up needed prior to proceeding with aortobifemoral bypass. After careful discussion of risks,  benefits, and alternatives the patient was offered aortobifemoral bypass.  We specifically discussed risk of death, heart attack, stroke, respiratory failure, renal insufficiency.  The patient understood and wished to proceed.  OPERATIVE FINDINGS: Proximal anastomosis more challenging than typical.  Heavily calcified plaque encountered which required aortic endarterectomy. The left renal vein needed to be divided to improve exposure of the proximal anastomosis.  Anatomic tunnels created between femoral exposure and new catheter retroperitoneal exposure.  Great care was taken to avoid injury to the ureters. Mild hematuria encountered intraoperatively.   The ureters were palpated in the course after development of hematuria and found to be intact. We suspected this was renal congestion from dividing the left renal vein.  Good hemostasis was achieved in the proximal anastomosis.  Bilateral common femoral endarterectomies were required at her distal anastomosis.  Pedal Doppler flow was noted at completion.  Transient hypotension was noted throughout the case.  See details of resuscitation above.  DESCRIPTION OF PROCEDURE: After identification of the patient in the pre-operative holding area, the patient was transferred to the operating room. The patient was positioned supine on the operating room table. Anesthesia was induced. The incision yesterday was prepped and draped in standard fashion. A surgical pause was performed confirming correct patient, procedure, and operative location.  Horizontal incision was made over the right groin over the bifurcation of the common femoral artery.  The incision was carried out subtenons tissue until the femoral sheath was encountered.  The femoral vessels were skeletonized.  Silastic Vesseloops were placed around the superficial femoral artery and the profunda femoris artery.  Exposure was carried over the anterior surface of the common femoral  artery into the external iliac  artery.  The circumflex iliac vein was identified, ligated with silk suture and divided.  Retroperitoneal tunnel was created using digital dissection.  Horizontal incision was made over the left groin over the bifurcation of the common femoral artery.  The incision was carried out subtenons tissue until the femoral sheath was encountered.  The femoral vessels were skeletonized.  Silastic Vesseloops were placed around the superficial femoral artery and the profunda femoris artery.  Exposure was carried over the anterior surface of the common femoral artery into the external iliac artery.  The circumflex iliac vein was identified, ligated with silk suture and divided.  Retroperitoneal tunnel was created using digital dissection.  A midline laparotomy was made from the xiphoid to just above the pubic symphysis.  The incision was carried down through subcutaneous tissue until the anterior rectus fascia was encountered.  This was divided in the midline.  Great care was taken to protect the underlying viscera.  The peritoneum was also divided.  Using my fingers to protect the visceral contents the reminder of the laparotomy was completed.  The abdomen was inspected.  The abdominal walls were retracted laterally with about 4 retractor.  The transverse colon was protected with a saline moistened laparotomy pad and eviscerated cranially.  The small bowel was protected with saline moistened laparotomy pad and eviscerated to the right.  The retroperitoneal was inspected.  The retroperitoneal was incised.  We came down onto the anterior surface of the aorta.  Exposure was carried cranially until the left renal vein was identified.  Exposure was carried caudally until the aortic bifurcation was encountered.  I obtained circumferential control around the immediate infrarenal abdominal aorta using a right angle clamp and an umbilical tape.  A Harkenson clamp was slotted into position.  Anatomic tunnels were made over the  anterior surface of the iliac vessels connecting the femoral exposures to the retroperitoneal exposure.  Great care was taken to avoid injuring the ureters during this process.  After connecting the 2 exposures with digital dissection and a bladder catheter was delivered through the tunnel to hold our position.  The patient was systemically heparinized.  Activated clotting time measurements were used throughout the case to confirm adequate anticoagulation.  After careful communication with the anesthesia staff we crossclamped the aorta proximally and distally.  The aorta was transected several centimeters distal to the takeoff of the renal arteries.  Severe, bulky, calcific plaque was encountered.  The distal aorta was partially debrided and then oversewn in 2 layers using 3-0 Prolene.  Attention was turned to the proximal anastomosis.  The bulky calcific plaque was endarterectomized.  We then brought a bifurcated 14 x 7 mm dacryon vascular graft onto the field.  A felt strip was cut to size to sit as a pledget to reinforce the anastomosis.  The proximal anastomosis was performed using continuous running suture of 3-0 Prolene.  Immediately prior to completion the anastomosis was tightened down.  The anastomosis was completed.  The anastomosis was tested by irrigating saline down the open end of the vascular graft.  2 areas of leak were noted and repaired using pledgeted suture.  Clamp was slowly released from the infrarenal aorta.  Hemostasis was achieved in the proximal anastomosis.  Given the difficulty of the proximal anastomosis and the extensive endarterectomy required I did divide the left renal vein to facilitate exposure should the clamp need to be moved higher.  About 30 to 45 minutes after dividing the left  renal vein the anesthesia staff noticed some mild blood in the urine.  Over the course of the case this turned to brownish discoloration.  The ureters were again palpated to confirm that they are  in position and not injured.  We felt this was likely left renal congestion from dividing the left renal vein.  The limbs of the bifurcated dacryon graft were then delivered through the retroperitoneal tunnel who was previously created with digital dissection.  The right common femoral artery was clamped proximally.  The superficial femoral artery and profunda femoris artery were clamped with the previously placed Silastic Vesseloops.  An anterior arteriotomy was made with an 11 blade and extended with Potts scissors.  Simple endarterectomy was performed with Annie Paras.  Good endpoints were achieved and brisk backbleeding was achieved.  The distal end of the vascular graft was beveled to allow end to side anastomosis and cut to sit without undue tension or redundancy.  The distal anastomosis was then performed using continuous running suture of 5-0 Prolene.  Immediately prior to completion the anastomosis was flushed and de-aired.  Clamp was released on the external iliac artery.  The graft was flushed into the pelvis.  Clamps were then released on the lower extremity to allow flow into the foot.  The left common femoral artery was clamped proximally.  The superficial femoral artery and profunda femoris artery were clamped with the previously placed Silastic Vesseloops.  An anterior arteriotomy was made with an 11 blade and extended with Potts scissors.  Simple endarterectomy was performed with Annie Paras.  Good endpoints were achieved and brisk backbleeding was achieved.  The distal end of the vascular graft was beveled to allow end to side anastomosis and cut to sit without undue tension or redundancy.  The distal anastomosis was then performed using continuous running suture of 5-0 Prolene.  Immediately prior to completion the anastomosis was flushed and de-aired.  Clamp was released on the external iliac artery.  The graft was flushed into the pelvis.  Clamps were then released on the lower  extremity to allow flow into the foot.  The wound beds were exposed chloride for hemostasis.  The anastomosis was found to be hemostatic.  The retroperitoneum was found to be hemostatic.  The heparin was reversed with protamine.  The retroperitoneum was closed with 3-0 Vicryl.  The abdominal wall was closed with interrupted 0 Vicryl sutures and #1 PDS suture.  3-0 Vicryl and 4-0 Monocryl were used to close the skin.  The groins were closed in layers using 2-0 Vicryl, 3-0 Vicryl, and 4-0 Monocryl.  Brisk Doppler flow was noted in the feet at completion.  Upon completion of the case instrument and sharps counts were confirmed correct. The patient was transferred to the PACU in good condition. I was present for all portions of the procedure.  Yevonne Aline. Stanford Breed, MD [facility] Phone Number: [phone] 09/20/2021 2:35 PM

## 2021-09-20 NOTE — Anesthesia Procedure Notes (Signed)
Epidural Patient location during procedure: pre-op Start time: 09/20/2021 8:05 AM End time: 09/20/2021 8:30 AM  Staffing Anesthesiologist: Darral Dash, DO Performed: anesthesiologist   Preanesthetic Checklist Completed: patient identified, IV checked, site marked, risks and benefits discussed, surgical consent, monitors and equipment checked, pre-op evaluation and timeout performed  Epidural Patient position: sitting Prep: ChloraPrep Patient monitoring: heart rate, continuous pulse ox and blood pressure Approach: midline Location: thoracic (1-12) Injection technique: LOR saline  Needle:  Needle type: Tuohy  Needle gauge: 17 G Needle length: 9 cm Needle insertion depth: 7 cm Catheter type: closed end flexible Catheter size: 20 Guage Catheter at skin depth: 12 cm Test dose: negative and 1.5% lidocaine with Epi 1:200 K  Assessment Events: blood not aspirated, injection not painful, no injection resistance and no paresthesia  Additional Notes T8/T9 level  Patient identified. Risks/Benefits/Options discussed with patient including but not limited to bleeding, infection, nerve damage, paralysis, failed block, incomplete pain control, headache, blood pressure changes, nausea, vomiting, reactions to medications, itching and postpartum back pain. Confirmed with bedside nurse the patient's most recent platelet count. Confirmed with patient that they are not currently taking any anticoagulation, have any bleeding history or any family history of bleeding disorders. Patient expressed understanding and wished to proceed. All questions were answered. Sterile technique was used throughout the entire procedure. Please see nursing notes for vital signs. Test dose was given through epidural catheter and negative prior to continuing to dose epidural or start infusion. Warning signs of high block given to the patient including shortness of breath, tingling/numbness in hands, complete motor  block, or any concerning symptoms with instructions to call for help. Patient was given instructions on fall risk and not to get out of bed. All questions and concerns addressed with instructions to call with any issues or inadequate analgesia.    Reason for block:procedure for pain

## 2021-09-20 NOTE — Consult Note (Addendum)
NAME:  Teresa [name], MRN:  [id number], DOB:  May 15, 1950, LOS: 0 ADMISSION DATE:  09/20/2021, CONSULTATION DATE:  7/19 REFERRING MD:  Stanford Breed REASON FOR CONSULT:  hypotension, blood loss   History of Present Illness:   Teresa [name], is a 71 y.o. female, who presented to [facility] for a planned aortobifemoral bypass graft with Dr. Darnelle Spangle.  They have a pertinent past medical history of atherosclerosis, AAA, coronary artery disease status post stents, lung cancer, COPD, GERD, ischemic cardiomyopathy, peripheral vascular disease, TAVR  The patient underwent an infrarenal abdominal aortic endarterectomy, aorto-bifemoral bypass, bilateral common femoral endarterectomy.  The procedure had blood loss of 2512ml.  2 units PRBCs, 1 unit FFP, 1 unit of cryo was given, 650 of blood returned via Cell Saver.  Of note renal vein needed dividing during the procedure which resulted in hematuria.  Patient also received 2 L IVF, 1 L albumin.  PCCM was consulted for assistance with blood loss and hypotension.  Patient transferred to ICU postprocedure.  Pertinent  Medical History  AAA, coronary artery disease status post stents, lung cancer, COPD, GERD, ischemic cardiomyopathy, peripheral vascular disease, TAVR  Significant Hospital Events: Including procedures, antibiotic start and stop dates in addition to other pertinent events   7/19 presented to [facility] for planned aortobifemoral bypass graft, PCCM consulted, tenets PRBCs, 1 unit FFP, 1 unit of cryo, 2.5 L blood loss  Interim History / Subjective:  See above  Phenylephrine 30 mic Thoracic epidural catheter with ropivacaine  Subjective: Denies chest pain, denies shortness of breath, denies pain, denies numbness or tingling to lower extremities  Objective   Blood pressure (!) 111/53, pulse (!) 53, resp. rate 15, height 4\' 11"  (1.499 m), weight 58.1 kg, last menstrual period 10/04/1999, SpO2 100 %.        Intake/Output Summary (Last 24  hours) at 09/20/2021 1513 Last data filed at 09/20/2021 1436 Gross per 24 hour  Intake 4536 ml  Output 2690 ml  Net 1846 ml   Filed Weights   09/20/21 0644  Weight: 58.1 kg    Examination: General: In bed, NAD, appears comfortable, pale HEENT: MM pink/dry, anicteric, atraumatic Neuro: RASS 0, PERRL 66mm, GCS 15 CV: S1S2, SB, no m/r/g appreciated PULM: Air movement in all lobes, trachea midline, chest expansion symmetric GI: soft, bsx4 hypoactive, abdomen not palpated due to recent surgery GU: Foley in place, dark red urine, no clots visualized Extremities: warm/dry, no pretibial edema, capillary refill less than 3 seconds  Skin: Midline abdominal incision closed with Surgicel, bilateral groin sites closed with Surgicel, C/D/I, no rashes or lesions noted  Labs/Imaging: 7.37/38/210/22.6 Fibrinogen 296 Platelets 119 ACT 89 I-STAT hemoglobin 11.9 I-STAT potassium 5.0 I-STAT sodium 140 BMP hemolyzed Chest x-ray: No pneumothorax, effusion, infiltrate noted Twelve-lead: Sinus bradycardia, no significant ST changes noted, T wave inversions previously noted on previous EKG  Resolved Hospital Problem list     Assessment & Plan:  Shock, multifactorial, suspect hypovolemic and medication induced. ABLA secondary to aorto-bifemoral bypass Thrombocytopenia secondary to aorto-bifemoral bypass The procedure had blood loss of 2563ml.  2 units PRBCs, 1 unit FFP, 1 unit of cryo was given, 650 of blood returned via Cell Saver. Patient also received 2 L IVF, 1 L albumin.  Patient with thoracic epidural catheter in place which could contribute to hypotension.  Also unable to account for insensible loss of open abdomen. -Admit to ICU -Goal MAP 65.  Continue phenylephrine peripherally.  Titrate to goal. -Follow-up CBC, BMP, PT/INR, lactate -  Transfuse PRBC if HBG less than 7 -Obtain AM CBC to trend H&H -Monitor for signs of bleeding -LR at 75 -Empiric calcium gluconate 1 mg IV x1 -Follow up  ECHO  POD 0 s/p infrarenal abdominal aortic endarterectomy, aorto-bifemoral bypass, bilateral common femoral endarterectomy with Dr. Standley Dakins -Management per VVS -Pain control via thoracic epidural catheter. Management per anesthesia   Hematuria  renal vein needed dividing during the procedure which resulted in hematuria. -Continue Foley -Urology consulted for evaluation for CBI -Follow-up BMP.  Concern for AKI. -LR at 52  COPD HX of SCC s/p RLL lobectomy in 2014 with Dr. Cyndia Bent On trelegy at home -Start incruse and breo  HX CAD s/p stents PAD HTN HLD HX TAVR AAA Ischemic cardiomyopathy Echo 04/21/2021 LVEF 50 to 55%, RVSF WNL, trivial aortic valve regurgitation, no mitral regurgitation. -Hold home antihypertensives -Plavix per vascular surgery -Follow up ECHO -Continuous telemetry -Resume statin in AM  GERD -PPI    Best Practice (right click and "Reselect all SmartList Selections" daily)   Diet/type: NPO w/ oral meds DVT prophylaxis: SCD GI prophylaxis: PPI Lines: N/A Foley:  Yes, and it is still needed Code Status:  full code Last date of multidisciplinary goals of care discussion [pending]  Labs   CBC: Recent Labs  Lab 09/18/21 1452 09/20/21 1156 09/20/21 1327 09/20/21 1420 09/20/21 1426  WBC 7.4  --   --  9.7  --   HGB 14.6 9.9* 12.6 12.5 11.9*  HCT 46.1* 29.0* 37.0 37.1 35.0*  MCV 93.3  --   --  91.8  --   PLT 312  --   --  119*  --     Basic Metabolic Panel: Recent Labs  Lab 09/18/21 1452 09/20/21 1156 09/20/21 1327 09/20/21 1426  NA 138 141 140 140  K 4.3 4.3 5.4* 5.0  CL 105  --   --   --   CO2 28  --   --   --   GLUCOSE 135*  --   --   --   BUN 20  --   --   --   CREATININE 1.13*  --   --   --   CALCIUM 9.3  --   --   --    GFR: Estimated Creatinine Clearance: 37 mL/min (A) (by C-G formula based on SCr of 1.13 mg/dL (H)). Recent Labs  Lab 09/18/21 1452 09/20/21 1420  WBC 7.4 9.7    Liver Function Tests: Recent Labs  Lab  09/18/21 1452  AST 22  ALT 15  ALKPHOS 68  BILITOT 1.0  PROT 7.8  ALBUMIN 4.3   No results for input(s): "LIPASE", "AMYLASE" in the last 168 hours. No results for input(s): "AMMONIA" in the last 168 hours.  ABG    Component Value Date/Time   PHART 7.376 09/20/2021 1426   PCO2ART 38.1 09/20/2021 1426   PO2ART 210 (H) 09/20/2021 1426   HCO3 22.6 09/20/2021 1426   TCO2 24 09/20/2021 1426   ACIDBASEDEF 3.0 (H) 09/20/2021 1426   O2SAT 100 09/20/2021 1426     Coagulation Profile: Recent Labs  Lab 09/18/21 1452  INR 1.1    Cardiac Enzymes: No results for input(s): "CKTOTAL", "CKMB", "CKMBINDEX", "TROPONINI" in the last 168 hours.  HbA1C: No results found for: "HGBA1C"  CBG: No results for input(s): "GLUCAP" in the last 168 hours.  Review of Systems:   Positives in bold  Gen: fever, chills, weight change, fatigue, night sweats HEENT:  blurred vision, double vision, hearing loss,  tinnitus, sinus congestion, rhinorrhea, sore throat, neck stiffness, dysphagia PULM:  shortness of breath, cough, sputum production, hemoptysis, wheezing CV: chest pain, edema, orthopnea, paroxysmal nocturnal dyspnea, palpitations GI:  abdominal pain, nausea, vomiting, diarrhea, hematochezia, melena, constipation, change in bowel habits GU: dysuria, hematuria, polyuria, oliguria, urethral discharge Endocrine: hot or cold intolerance, polyuria, polyphagia or appetite change Derm: rash, dry skin, scaling or peeling skin change Heme: easy bruising, bleeding, bleeding gums Neuro: headache, numbness, weakness, slurred speech, loss of memory or consciousness   Past Medical History:  She,  has a past medical history of AAA (abdominal aortic aneurysm) ([place]), Acute on chronic combined systolic and diastolic heart failure ([place]) (08/19/2018), Arthritis, CAD (coronary artery disease), native coronary artery, Cancer (HCC), Cataract, Chronic combined systolic and diastolic CHF (congestive heart failure)  (HCC), COPD (chronic obstructive pulmonary disease) ([place]), Coronary artery disease, GERD (gastroesophageal reflux disease), Hypercholesterolemia, Ischemic cardiomyopathy, Mass of lung, Mitral regurgitation, Peripheral vascular disease, unspecified (Home), Pneumonia, PONV (postoperative nausea and vomiting), S/P TAVR (transcatheter aortic valve replacement), Severe aortic stenosis, Severe mitral regurgitation, Tachycardia, unspecified, and Tobacco abuse.   Surgical History:   Past Surgical History:  Procedure Laterality Date   ABDOMINAL AORTOGRAM W/LOWER EXTREMITY N/A 08/03/2021   Procedure: ABDOMINAL AORTOGRAM W/LOWER EXTREMITY;  Surgeon: Marty Heck, MD;  Location: [place] [facility];  Service: Cardiovascular;  Laterality: N/A;   CARDIAC CATHETERIZATION  08/2018   CORONARY ANGIOPLASTY WITH STENT PLACEMENT  10/2012; 12/08/2012   "7 + 3" (02/27/2013)   ESOPHAGOGASTRODUODENOSCOPY (EGD) WITH PROPOFOL N/A 01/11/2015   Procedure: ESOPHAGOGASTRODUODENOSCOPY (EGD) WITH PROPOFOL;  Surgeon: Garlan Fair, MD;  Location: [facility] ENDOSCOPY;  Service: Endoscopy;  Laterality: N/A;   FLEXIBLE BRONCHOSCOPY N/A 03/02/2013   Procedure: FLEXIBLE BRONCHOSCOPY;  Surgeon: Gaye Pollack, MD;  Location: [place];  Service: Thoracic;  Laterality: N/A;   FLEXIBLE SIGMOIDOSCOPY N/A 01/11/2015   Procedure: FLEXIBLE SIGMOIDOSCOPY;  Surgeon: Garlan Fair, MD;  Location: [facility];  Service: Endoscopy;  Laterality: N/A;  unable to complete colon-prep issues   GANGLION CYST EXCISION Left 1975   "wrist"   LEFT HEART CATH AND CORONARY ANGIOGRAPHY N/A 06/29/2016   Procedure: Left Heart Cath and Coronary Angiography;  Surgeon: Jettie Booze, MD;  Location: [place] [facility];  Service: Cardiovascular;  Laterality: N/A;   LEFT HEART CATH AND CORONARY ANGIOGRAPHY N/A 08/19/2018   Procedure: LEFT HEART CATH AND CORONARY ANGIOGRAPHY;  Surgeon: Burnell Blanks, MD;  Location: [place] [facility];  Service:  Cardiovascular;  Laterality: N/A;   LEFT HEART CATHETERIZATION WITH CORONARY ANGIOGRAM N/A 12/08/2012   Procedure: LEFT HEART CATHETERIZATION WITH CORONARY ANGIOGRAM;  Surgeon: Jettie Booze, MD;  Location: [facility] CATH LAB;  Service: Cardiovascular;  Laterality: N/A;   MOUTH SURGERY  2010?   "for bone loss" (06/24/2012)   PERCUTANEOUS CORONARY STENT INTERVENTION (PCI-S) N/A 10/09/2012   Procedure: PERCUTANEOUS CORONARY STENT INTERVENTION (PCI-S);  Surgeon: Jettie Booze, MD;  Location: [facility] CATH LAB;  Service: Cardiovascular;  Laterality: N/A;   RIGHT HEART CATH N/A 08/19/2018   Procedure: RIGHT HEART CATH;  Surgeon: Burnell Blanks, MD;  Location: [place] [facility];  Service: Cardiovascular;  Laterality: N/A;   TEE WITHOUT CARDIOVERSION N/A 08/22/2018   Procedure: TRANSESOPHAGEAL ECHOCARDIOGRAM (TEE);  Surgeon: Pixie Casino, MD;  Location: [facility] ENDOSCOPY;  Service: Cardiovascular;  Laterality: N/A;   TEE WITHOUT CARDIOVERSION N/A 08/26/2018   Procedure: TRANSESOPHAGEAL ECHOCARDIOGRAM (TEE);  Surgeon: Burnell Blanks, MD;  Location: [place];  Service: Open Heart Surgery;  Laterality: N/A;   THORACOTOMY/LOBECTOMY Right 03/02/2013   Procedure: Right Video Assisted Thoracoscopy/Thoracotomy with upper Lobectomy;  Surgeon: Gaye Pollack, MD;  Location: [facility] OR;  Service: Thoracic;  Laterality: Right;  Right Lung Upper  Lobectomy    TONSILLECTOMY  1960   VIDEO BRONCHOSCOPY  09/12/2011   Procedure: VIDEO BRONCHOSCOPY WITH FLUORO;  Surgeon: Tanda Rockers, MD;  Location: [facility] ENDOSCOPY;  Service: Cardiopulmonary;  Laterality: Bilateral;     Social History:   reports that she quit smoking about 8 years ago. Her smoking use included cigarettes. She has a 20.00 pack-year smoking history. She has never used smokeless tobacco. She reports that she does not drink alcohol and does not use drugs.   Family History:  Her family history includes Alcohol abuse in her father; Lupus in her mother;  Migraines in her sister; Other in her sister; Pulmonary fibrosis in her brother. There is no history of Heart attack.   Allergies Allergies  Allergen Reactions   Azithromycin Shortness Of Breath   Ceclor [Cefaclor] Shortness Of Breath and Rash   Penicillins Other (See Comments)    Unknown from childhood Did it involve swelling of the face/tongue/throat, SOB, or low BP? No Did it involve sudden or severe rash/hives, skin peeling, or any reaction on the inside of your mouth or nose? No Did you need to seek medical attention at a hospital or doctor's office? Yes When did it last happen? Infancy  If all above answers are "NO", may proceed with cephalosporin use.    Septra [Sulfamethoxazole-Trimethoprim] Shortness Of Breath and Rash   Doxycycline Swelling and Other (See Comments)    Redness on the face   Lipitor [Atorvastatin] Other (See Comments)    Severe muscle aches   Morphine And Related Nausea And Vomiting and Other (See Comments)    Terrible headache   Zetia [Ezetimibe] Other (See Comments)    Severe stomach pain   Zocor [Simvastatin] Other (See Comments)    Severe muscle aches   Adhesive [Tape] Other (See Comments)    Redness and swelling - use paper tape   Vicodin [Hydrocodone-Acetaminophen] Nausea And Vomiting     Home Medications  Prior to Admission medications   Medication Sig Start Date End Date Taking? Authorizing Provider  Alirocumab (PRALUENT) 150 MG/ML SOAJ INJECT 1 PEN INTO THE SKIN EVERY 14 (FOURTEEN) DAYS. 02/20/21  Yes Jettie Booze, MD  bisoprolol (ZEBETA) 5 MG tablet TAKE 1/2 TABLET BY MOUTH EVERY DAY 01/20/21  Yes Bensimhon, Shaune Pascal, MD  clopidogrel (PLAVIX) 75 MG tablet TAKE 1 TABLET BY MOUTH EVERY DAY 07/18/21  Yes Larey Dresser, MD  empagliflozin (JARDIANCE) 10 MG TABS tablet Take 1 tablet by mouth daily. 09/08/21  Yes Bensimhon, Shaune Pascal, MD  Fluticasone-Umeclidin-Vilant (TRELEGY ELLIPTA) 100-62.5-25 MCG/INH AEPB Inhale 1 puff into the lungs daily.  07/17/19  Yes Tanda Rockers, MD  furosemide (LASIX) 40 MG tablet TAKE 1 TABLET BY MOUTH AS NEEDED FOR FLUID OR EDEMA Patient taking differently: Take 40 mg by mouth 2 (two) times a week. Mondays & Fridays in the morning 06/02/21  Yes Bensimhon, Shaune Pascal, MD  Hypromellose 0.2 % SOLN Place 2 drops into both eyes 4 (four) times daily as needed (dry eyes).    Yes [provider]  lansoprazole (PREVACID) 30 MG capsule Take 30 mg by mouth in the morning.   Yes [provider]  NASAL SALINE NA Place 1 spray into the nose daily as needed (congestion).    Yes [provider]  rosuvastatin (CRESTOR) 5 MG tablet Take 1 tablet (5 mg total) by mouth 3 (three) times a week. 09/08/21  Yes Jettie Booze, MD  sacubitril-valsartan (ENTRESTO) 24-26 MG Take 1 tablet by mouth 2 (two) times daily. Please wait to begin for 36 hours after discontinuing the Losartan. 01/24/21  Yes Bensimhon, Shaune Pascal, MD  simethicone (MYLICON) 80 MG chewable tablet Chew 2 tablets (160 mg total) by mouth every 6 (six) hours as needed for flatulence. 08/28/18  Yes Antony Odea, PA-C  Specialty Vitamins Products ([employer] MENOPAUSE SUPPORT PO) Take 1 capsule by mouth in the morning.   Yes [provider]  spironolactone (ALDACTONE) 25 MG tablet TAKE 1 TABLET(25 MG) BY MOUTH DAILY 01/23/21  Yes Bensimhon, Shaune Pascal, MD  albuterol (PROVENTIL) (2.5 MG/3ML) 0.083% nebulizer solution Take 2.5 mg by nebulization every 6 (six) hours as needed for shortness of breath or wheezing. 06/03/21   [provider]  albuterol (VENTOLIN HFA) 108 (90 Base) MCG/ACT inhaler Inhale 2 puffs into the lungs every 6 (six) hours as needed for wheezing or shortness of breath.    [provider]  clindamycin (CLEOCIN) 300 MG capsule Take 600 mg by mouth See admin instructions. Take 600 mg by mouth 1 hour prior to dental procedures    [provider]  empagliflozin (JARDIANCE) 10 MG TABS tablet Take 1 tablet  (10 mg total) by mouth daily before breakfast. Patient not taking: Reported on 09/13/2021 04/21/21   Bensimhon, Shaune Pascal, MD  nitroGLYCERIN (NITROSTAT) 0.4 MG SL tablet DISSOLVE ONE TABLET UNDER TONGUE AS NEEDED FOR CHEST PAIN EVERY 5 MINUTES 10/13/20   Bensimhon, Shaune Pascal, MD  Spacer/Aero-Holding Chambers (AEROCHAMBER MV) inhaler Use as instructed 01/09/18   Lauraine Rinne, NP     Critical care time: 40 minutes    [employer]., MSN, APRN, AGACNP-BC [facility]  09/20/2021 , 3:16 PM  Please see [email or web address] for pager details  If no response, please call [phone] After hours, please call Elink at [phone]   ,

## 2021-09-20 NOTE — Anesthesia Procedure Notes (Signed)
Procedure Name: Intubation Date/Time: 09/20/2021 9:07 AM  Performed by: Moshe Salisbury, CRNAPre-anesthesia Checklist: Patient identified, Emergency Drugs available, Suction available and Patient being monitored Patient Re-evaluated:Patient Re-evaluated prior to induction Oxygen Delivery Method: Circle System Utilized Preoxygenation: Pre-oxygenation with 100% oxygen Induction Type: IV induction Ventilation: Mask ventilation without difficulty and Oral airway inserted - appropriate to patient size Laryngoscope Size: Sabra Heck and 4 Grade View: Grade I Tube type: Oral Tube size: 7.0 mm Number of attempts: 1 Airway Equipment and Method: Stylet and Oral airway Placement Confirmation: ETT inserted through vocal cords under direct vision, positive ETCO2 and breath sounds checked- equal and bilateral Secured at: 21 cm Tube secured with: Tape Dental Injury: Teeth and Oropharynx as per pre-operative assessment

## 2021-09-20 NOTE — Consult Note (Addendum)
Urology Consult   Physician requesting consult: Noemi Chapel, DO  Reason for consult: Gross hematuria  History of Present Illness: Teresa [name] is a 71 y.o. with a history of coronary artery disease who underwent aortobifemoral bypass graft.  During the surgery, her left renal vein had to be ligated for better exposure.  Intraoperatively, she was noted to have gross hematuria that persisted postoperatively.  Urology has been consulted to manage her gross hematuria.  She denies any significant domino pain or flank pain.  She denies prior history of gross hematuria.  She denies family history of urologic malignancy.  She is tolerating Foley catheter well.  Past Medical History:  Diagnosis Date   AAA (abdominal aortic aneurysm) ([place])    a. small by cath 2014.   Acute on chronic combined systolic and diastolic heart failure ([place]) 08/19/2018   Arthritis    "across my hips; buttocks; comes w/the weather" (02/27/2013)   CAD (coronary artery disease), native coronary artery    a. 10/2012 - rotational atherectomy of LAD c/b dissection s/p PCI to LAD, LCx, LM. b. 12/2012 - s/p overlapping DES to RCA.   Cancer [facility])    Cataract    "just the beginnings on the right" (02/27/2013)   Chronic combined systolic and diastolic CHF (congestive heart failure) (HCC)    COPD (chronic obstructive pulmonary disease) (HCC)    Coronary artery disease    GERD (gastroesophageal reflux disease)    Hypercholesterolemia    Ischemic cardiomyopathy    a. EF 45-50%.   Mass of lung    "small tumor RUL; they are watching it" (02/27/2013)   Mitral regurgitation    Peripheral vascular disease, unspecified (HCC)    Pneumonia    PONV (postoperative nausea and vomiting)    S/P TAVR (transcatheter aortic valve replacement)    23 mm Edwards Sapien 3 transcatheter heart valve placed via open left transaxillary approach    Severe aortic stenosis    Severe mitral regurgitation    Tachycardia, unspecified    Tobacco abuse      Past Surgical History:  Procedure Laterality Date   ABDOMINAL AORTOGRAM W/LOWER EXTREMITY N/A 08/03/2021   Procedure: ABDOMINAL AORTOGRAM W/LOWER EXTREMITY;  Surgeon: Marty Heck, MD;  Location: [facility];  Service: Cardiovascular;  Laterality: N/A;   CARDIAC CATHETERIZATION  08/2018   CORONARY ANGIOPLASTY WITH STENT PLACEMENT  10/2012; 12/08/2012   "7 + 3" (02/27/2013)   ESOPHAGOGASTRODUODENOSCOPY (EGD) WITH PROPOFOL N/A 01/11/2015   Procedure: ESOPHAGOGASTRODUODENOSCOPY (EGD) WITH PROPOFOL;  Surgeon: Garlan Fair, MD;  Location: [facility];  Service: Endoscopy;  Laterality: N/A;   FLEXIBLE BRONCHOSCOPY N/A 03/02/2013   Procedure: FLEXIBLE BRONCHOSCOPY;  Surgeon: Gaye Pollack, MD;  Location: [facility];  Service: Thoracic;  Laterality: N/A;   FLEXIBLE SIGMOIDOSCOPY N/A 01/11/2015   Procedure: FLEXIBLE SIGMOIDOSCOPY;  Surgeon: Garlan Fair, MD;  Location: [facility];  Service: Endoscopy;  Laterality: N/A;  unable to complete colon-prep issues   GANGLION CYST EXCISION Left 1975   "wrist"   LEFT HEART CATH AND CORONARY ANGIOGRAPHY N/A 06/29/2016   Procedure: Left Heart Cath and Coronary Angiography;  Surgeon: Jettie Booze, MD;  Location: [facility];  Service: Cardiovascular;  Laterality: N/A;   LEFT HEART CATH AND CORONARY ANGIOGRAPHY N/A 08/19/2018   Procedure: LEFT HEART CATH AND CORONARY ANGIOGRAPHY;  Surgeon: Burnell Blanks, MD;  Location: [facility];  Service: Cardiovascular;  Laterality: N/A;   LEFT HEART CATHETERIZATION WITH CORONARY ANGIOGRAM N/A  12/08/2012   Procedure: LEFT HEART CATHETERIZATION WITH CORONARY ANGIOGRAM;  Surgeon: Jettie Booze, MD;  Location: [facility] CATH LAB;  Service: Cardiovascular;  Laterality: N/A;   MOUTH SURGERY  2010?   "for bone loss" (06/24/2012)   PERCUTANEOUS CORONARY STENT INTERVENTION (PCI-S) N/A 10/09/2012   Procedure: PERCUTANEOUS CORONARY STENT INTERVENTION (PCI-S);  Surgeon: Jettie Booze,  MD;  Location: [facility] LAB;  Service: Cardiovascular;  Laterality: N/A;   RIGHT HEART CATH N/A 08/19/2018   Procedure: RIGHT HEART CATH;  Surgeon: Burnell Blanks, MD;  Location: [place] [facility];  Service: Cardiovascular;  Laterality: N/A;   TEE WITHOUT CARDIOVERSION N/A 08/22/2018   Procedure: TRANSESOPHAGEAL ECHOCARDIOGRAM (TEE);  Surgeon: Pixie Casino, MD;  Location: [facility] ENDOSCOPY;  Service: Cardiovascular;  Laterality: N/A;   TEE WITHOUT CARDIOVERSION N/A 08/26/2018   Procedure: TRANSESOPHAGEAL ECHOCARDIOGRAM (TEE);  Surgeon: Burnell Blanks, MD;  Location: [facility];  Service: Open Heart Surgery;  Laterality: N/A;   THORACOTOMY/LOBECTOMY Right 03/02/2013   Procedure: Right Video Assisted Thoracoscopy/Thoracotomy with upper Lobectomy;  Surgeon: Gaye Pollack, MD;  Location: [facility] OR;  Service: Thoracic;  Laterality: Right;  Right Lung Upper  Lobectomy    TONSILLECTOMY  1960   VIDEO BRONCHOSCOPY  09/12/2011   Procedure: VIDEO BRONCHOSCOPY WITH FLUORO;  Surgeon: Tanda Rockers, MD;  [facility];  Service: Cardiopulmonary;  Laterality: Bilateral;     Current Hospital Medications:  Home meds:  No current facility-administered medications on file prior to encounter.   Current Outpatient Medications on File Prior to Encounter  Medication Sig Dispense Refill   Alirocumab (PRALUENT) 150 MG/ML SOAJ INJECT 1 PEN INTO THE SKIN EVERY 14 (FOURTEEN) DAYS. 6 mL 3   bisoprolol (ZEBETA) 5 MG tablet TAKE 1/2 TABLET BY MOUTH EVERY DAY 90 tablet 2   clopidogrel (PLAVIX) 75 MG tablet TAKE 1 TABLET BY MOUTH EVERY DAY 90 tablet 2   empagliflozin (JARDIANCE) 10 MG TABS tablet Take 1 tablet by mouth daily. 90 tablet 3   Fluticasone-Umeclidin-Vilant (TRELEGY ELLIPTA) 100-62.5-25 MCG/INH AEPB Inhale 1 puff into the lungs daily. 60 each 11   furosemide (LASIX) 40 MG tablet TAKE 1 TABLET BY MOUTH AS NEEDED FOR FLUID OR EDEMA (Patient taking differently: Take 40 mg by mouth 2 (two) times a  week. Mondays & Fridays in the morning) 90 tablet 0   Hypromellose 0.2 % SOLN Place 2 drops into both eyes 4 (four) times daily as needed (dry eyes).      lansoprazole (PREVACID) 30 MG capsule Take 30 mg by mouth in the morning.     NASAL SALINE NA Place 1 spray into the nose daily as needed (congestion).      rosuvastatin (CRESTOR) 5 MG tablet Take 1 tablet (5 mg total) by mouth 3 (three) times a week. 36 tablet 3   sacubitril-valsartan (ENTRESTO) 24-26 MG Take 1 tablet by mouth 2 (two) times daily. Please wait to begin for 36 hours after discontinuing the Losartan. 180 tablet 3   simethicone (MYLICON) 80 MG chewable tablet Chew 2 tablets (160 mg total) by mouth every 6 (six) hours as needed for flatulence. 30 tablet 0   Specialty Vitamins Products ([employer] MENOPAUSE SUPPORT PO) Take 1 capsule by mouth in the morning.     spironolactone (ALDACTONE) 25 MG tablet TAKE 1 TABLET(25 MG) BY MOUTH DAILY 90 tablet 3   albuterol (PROVENTIL) (2.5 MG/3ML) 0.083% nebulizer solution Take 2.5 mg by nebulization every 6 (six) hours as needed for shortness of breath or  wheezing.     albuterol (VENTOLIN HFA) 108 (90 Base) MCG/ACT inhaler Inhale 2 puffs into the lungs every 6 (six) hours as needed for wheezing or shortness of breath.     clindamycin (CLEOCIN) 300 MG capsule Take 600 mg by mouth See admin instructions. Take 600 mg by mouth 1 hour prior to dental procedures     empagliflozin (JARDIANCE) 10 MG TABS tablet Take 1 tablet (10 mg total) by mouth daily before breakfast. (Patient not taking: Reported on 09/13/2021) 90 tablet 6   nitroGLYCERIN (NITROSTAT) 0.4 MG SL tablet DISSOLVE ONE TABLET UNDER TONGUE AS NEEDED FOR CHEST PAIN EVERY 5 MINUTES 25 tablet 4   Spacer/Aero-Holding Chambers (AEROCHAMBER MV) inhaler Use as instructed 1 each 0     Scheduled Meds:  sodium chloride   Intravenous Once   sodium chloride   Intravenous Once   sodium chloride   Intravenous Once   [START ON 09/21/2021] docusate sodium  100  mg Oral Daily   [START ON 09/21/2021] fluticasone furoate-vilanterol  1 puff Inhalation Daily   And   [START ON 09/21/2021] umeclidinium bromide  1 puff Inhalation Daily   [START ON 09/21/2021] heparin  5,000 Units Subcutaneous Q8H   insulin aspart  0-15 Units Subcutaneous TID WC   pantoprazole (PROTONIX) IV  40 mg Intravenous Q24H   Continuous Infusions:  sodium chloride     sodium chloride 250 mL (09/20/21 1818)   calcium gluconate 50 mL/hr at 09/20/21 1800   lactated ringers 75 mL/hr at 09/20/21 1800   magnesium sulfate bolus IVPB     phenylephrine (NEO-SYNEPHRINE) Adult infusion 30 mcg/min (09/20/21 1800)   vancomycin 1,000 mg (09/20/21 1819)   PRN Meds:.sodium chloride, acetaminophen **OR** acetaminophen, albuterol, bisacodyl, guaiFENesin-dextromethorphan, hydrALAZINE, HYDROmorphone (DILAUDID) injection, labetalol, magnesium sulfate bolus IVPB, metoprolol tartrate, nitroGLYCERIN, ondansetron, perflutren lipid microspheres (DEFINITY) IV suspension, phenol, polyvinyl alcohol  Allergies:  Allergies  Allergen Reactions   Azithromycin Shortness Of Breath   Ceclor [Cefaclor] Shortness Of Breath and Rash   Penicillins Other (See Comments)    Unknown from childhood Did it involve swelling of the face/tongue/throat, SOB, or low BP? No Did it involve sudden or severe rash/hives, skin peeling, or any reaction on the inside of your mouth or nose? No Did you need to seek medical attention at a hospital or doctor's office? Yes When did it last happen? Infancy  If all above answers are "NO", may proceed with cephalosporin use.    Septra [Sulfamethoxazole-Trimethoprim] Shortness Of Breath and Rash   Doxycycline Swelling and Other (See Comments)    Redness on the face   Lipitor [Atorvastatin] Other (See Comments)    Severe muscle aches   Morphine And Related Nausea And Vomiting and Other (See Comments)    Terrible headache   Zetia [Ezetimibe] Other (See Comments)    Severe stomach pain    Zocor [Simvastatin] Other (See Comments)    Severe muscle aches   Adhesive [Tape] Other (See Comments)    Redness and swelling - use paper tape   Vicodin [Hydrocodone-Acetaminophen] Nausea And Vomiting    Family History  Problem Relation Age of Onset   Alcohol abuse Father    Lupus Mother    Other Sister        Degenerative disc disease   Migraines Sister    Pulmonary fibrosis Brother    Heart attack Neg Hx     Social History:  reports that she quit smoking about 8 years ago. Her smoking use included cigarettes. She has  a 20.00 pack-year smoking history. She has never used smokeless tobacco. She reports that she does not drink alcohol and does not use drugs.  ROS: A complete review of systems was performed.  All systems are negative except for pertinent findings as noted.  Physical Exam:  Vital signs in last 24 hours: Temp:  [97 F (36.1 C)-97.4 F (36.3 C)] 97.4 F (36.3 C) (07/19 1530) Pulse Rate:  [50-69] 61 (07/19 1700) Resp:  [13-17] 16 (07/19 1700) BP: (89-137)/(45-75) 99/50 (07/19 1700) SpO2:  [91 %-100 %] 91 % (07/19 1700) Arterial Line BP: (84-122)/(48-106) 110/60 (07/19 1700) Weight:  [58.1 kg] 58.1 kg (07/19 0644) Constitutional:  Alert and oriented, No acute distress Cardiovascular: Regular rate and rhythm Respiratory: Normal respiratory effort, Lungs clear bilaterally GI: Abdomen is soft, nontender, nondistended, no abdominal masses GU: No CVA tenderness Neurologic: Grossly intact, no focal deficits Psychiatric: Normal mood and affect  Laboratory Data:  Recent Labs    09/18/21 1452 09/20/21 1156 09/20/21 1327 09/20/21 1420 09/20/21 1426 09/20/21 1650  WBC 7.4  --   --  9.7  --  9.1  HGB 14.6 9.9* 12.6 12.5 11.9* 12.5  HCT 46.1* 29.0* 37.0 37.1 35.0* 37.1  PLT 312  --   --  119*  --  142*    Recent Labs    09/18/21 1452 09/20/21 1156 09/20/21 1327 09/20/21 1426 09/20/21 1528 09/20/21 1650  NA 138 141 140 140 139 141  K 4.3 4.3 5.4* 5.0  5.6* 5.2*  CL 105  --   --   --  109 110  GLUCOSE 135*  --   --   --  156* 149*  BUN 20  --   --   --  22 23  CALCIUM 9.3  --   --   --  7.4* 7.4*  CREATININE 1.13*  --   --   --  0.94 1.06*     Results for orders placed or performed during the hospital encounter of 09/20/21 (from the past 24 hour(s))  POCT Activated clotting time     Status: None   Collection Time: 09/20/21 11:02 AM  Result Value Ref Range   Activated Clotting Time 233 seconds  POCT Activated clotting time     Status: None   Collection Time: 09/20/21 11:13 AM  Result Value Ref Range   Activated Clotting Time 275 seconds  POCT Activated clotting time     Status: None   Collection Time: 09/20/21 11:49 AM  Result Value Ref Range   Activated Clotting Time 257 seconds  I-STAT 7, (LYTES, BLD GAS, ICA, H+H)     Status: Abnormal   Collection Time: 09/20/21 11:56 AM  Result Value Ref Range   pH, Arterial 7.342 (L) 7.35 - 7.45   pCO2 arterial 43.8 32 - 48 mmHg   pO2, Arterial 215 (H) 83 - 108 mmHg   Bicarbonate 23.9 20.0 - 28.0 mmol/L   TCO2 25 22 - 32 mmol/L   O2 Saturation 100 %   Acid-base deficit 2.0 0.0 - 2.0 mmol/L   Sodium 141 135 - 145 mmol/L   Potassium 4.3 3.5 - 5.1 mmol/L   Calcium, Ion 1.15 1.15 - 1.40 mmol/L   HCT 29.0 (L) 36.0 - 46.0 %   Hemoglobin 9.9 (L) 12.0 - 15.0 g/dL   Patient temperature 36.4 C    Sample type ARTERIAL   Prepare RBC (crossmatch)     Status: None   Collection Time: 09/20/21 12:02 PM  Result Value Ref  Range   Order Confirmation      ORDER PROCESSED BY BLOOD BANK Performed at [facility], [place] [address] [place], [place] [address]   POCT Activated clotting time     Status: None   Collection Time: 09/20/21 12:27 PM  Result Value Ref Range   Activated Clotting Time 203 seconds  POCT Activated clotting time     Status: None   Collection Time: 09/20/21 12:48 PM  Result Value Ref Range   Activated Clotting Time 263 seconds  Prepare fresh frozen plasma     Status: None  (Preliminary result)   Collection Time: 09/20/21  1:22 PM  Result Value Ref Range   Unit Number [id number]    Blood Component Type THW PLS APHR    Unit division 00    Status of Unit ISSUED    Transfusion Status      OK TO TRANSFUSE Performed at [facility], [place] [address] [place], [place] [address]   Prepare platelet pheresis     Status: None (Preliminary result)   Collection Time: 09/20/21  1:22 PM  Result Value Ref Range   Unit Number [id number]    Blood Component Type PSORALEN TREATED    Unit division 00    Status of Unit ALLOCATED    Transfusion Status      OK TO TRANSFUSE Performed at [facility] [address] [place], [place] [address]   I-STAT 7, (LYTES, BLD GAS, ICA, H+H)     Status: Abnormal   Collection Time: 09/20/21  1:27 PM  Result Value Ref Range   pH, Arterial 7.333 (L) 7.35 - 7.45   pCO2 arterial 40.1 32 - 48 mmHg   pO2, Arterial 246 (H) 83 - 108 mmHg   Bicarbonate 21.5 20.0 - 28.0 mmol/L   TCO2 23 22 - 32 mmol/L   O2 Saturation 100 %   Acid-base deficit 4.0 (H) 0.0 - 2.0 mmol/L   Sodium 140 135 - 145 mmol/L   Potassium 5.4 (H) 3.5 - 5.1 mmol/L   Calcium, Ion 0.95 (L) 1.15 - 1.40 mmol/L   HCT 37.0 36.0 - 46.0 %   Hemoglobin 12.6 12.0 - 15.0 g/dL   Patient temperature 36.1 C    Sample type ARTERIAL   Prepare cryoprecipitate     Status: None (Preliminary result)   Collection Time: 09/20/21  1:31 PM  Result Value Ref Range   Unit Number [id number]    Blood Component Type CRYPOOL THAW    Unit division 00    Status of Unit ISSUED    Transfusion Status      OK TO TRANSFUSE Performed at [facility], [address], [place], [place] [address]   POCT Activated clotting time     Status: None   Collection Time: 09/20/21  1:36 PM  Result Value Ref Range   Activated Clotting Time 0 seconds  POCT Activated clotting time     Status: None   Collection Time: 09/20/21  1:48 PM  Result Value Ref Range   Activated Clotting Time 89  seconds  CBC     Status: Abnormal   Collection Time: 09/20/21  2:20 PM  Result Value Ref Range   WBC 9.7 4.0 - 10.5 K/uL   RBC 4.04 3.87 - 5.11 MIL/uL   Hemoglobin 12.5 12.0 - 15.0 g/dL   HCT 37.1 36.0 - 46.0 %   MCV 91.8 80.0 - 100.0 fL   MCH 30.9 26.0 - 34.0 pg  MCHC 33.7 30.0 - 36.0 g/dL   RDW 13.5 11.5 - 15.5 %   Platelets 119 (L) 150 - 400 K/uL   nRBC 0.0 0.0 - 0.2 %  Fibrinogen     Status: None   Collection Time: 09/20/21  2:20 PM  Result Value Ref Range   Fibrinogen 296 210 - 475 mg/dL  I-STAT 7, (LYTES, BLD GAS, ICA, H+H)     Status: Abnormal   Collection Time: 09/20/21  2:26 PM  Result Value Ref Range   pH, Arterial 7.376 7.35 - 7.45   pCO2 arterial 38.1 32 - 48 mmHg   pO2, Arterial 210 (H) 83 - 108 mmHg   Bicarbonate 22.6 20.0 - 28.0 mmol/L   TCO2 24 22 - 32 mmol/L   O2 Saturation 100 %   Acid-base deficit 3.0 (H) 0.0 - 2.0 mmol/L   Sodium 140 135 - 145 mmol/L   Potassium 5.0 3.5 - 5.1 mmol/L   Calcium, Ion 0.94 (L) 1.15 - 1.40 mmol/L   HCT 35.0 (L) 36.0 - 46.0 %   Hemoglobin 11.9 (L) 12.0 - 15.0 g/dL   Patient temperature 36.1 C    Sample type ARTERIAL   Basic metabolic panel     Status: Abnormal   Collection Time: 09/20/21  3:28 PM  Result Value Ref Range   Sodium 139 135 - 145 mmol/L   Potassium 5.6 (H) 3.5 - 5.1 mmol/L   Chloride 109 98 - 111 mmol/L   CO2 20 (L) 22 - 32 mmol/L   Glucose, Bld 156 (H) 70 - 99 mg/dL   BUN 22 8 - 23 mg/dL   Creatinine, Ser 0.94 0.44 - 1.00 mg/dL   Calcium 7.4 (L) 8.9 - 10.3 mg/dL   GFR, Estimated >60 >60 mL/min   Anion gap 10 5 - 15  Glucose, capillary     Status: Abnormal   Collection Time: 09/20/21  4:03 PM  Result Value Ref Range   Glucose-Capillary 153 (H) 70 - 99 mg/dL  Lactic acid, plasma     Status: None   Collection Time: 09/20/21  4:07 PM  Result Value Ref Range   Lactic Acid, Venous 1.7 0.5 - 1.9 mmol/L  Hemoglobin A1c     Status: None   Collection Time: 09/20/21  4:38 PM  Result Value Ref Range   Hgb  A1c MFr Bld 5.5 4.8 - 5.6 %   Mean Plasma Glucose 111.15 mg/dL  Troponin I (High Sensitivity)     Status: None   Collection Time: 09/20/21  4:38 PM  Result Value Ref Range   Troponin I (High Sensitivity) 7 <18 ng/L  CBC     Status: Abnormal   Collection Time: 09/20/21  4:50 PM  Result Value Ref Range   WBC 9.1 4.0 - 10.5 K/uL   RBC 4.08 3.87 - 5.11 MIL/uL   Hemoglobin 12.5 12.0 - 15.0 g/dL   HCT 37.1 36.0 - 46.0 %   MCV 90.9 80.0 - 100.0 fL   MCH 30.6 26.0 - 34.0 pg   MCHC 33.7 30.0 - 36.0 g/dL   RDW 13.8 11.5 - 15.5 %   Platelets 142 (L) 150 - 400 K/uL   nRBC 0.0 0.0 - 0.2 %  Basic metabolic panel     Status: Abnormal   Collection Time: 09/20/21  4:50 PM  Result Value Ref Range   Sodium 141 135 - 145 mmol/L   Potassium 5.2 (H) 3.5 - 5.1 mmol/L   Chloride 110 98 - 111 mmol/L  CO2 21 (L) 22 - 32 mmol/L   Glucose, Bld 149 (H) 70 - 99 mg/dL   BUN 23 8 - 23 mg/dL   Creatinine, Ser 1.06 (H) 0.44 - 1.00 mg/dL   Calcium 7.4 (L) 8.9 - 10.3 mg/dL   GFR, Estimated 57 (L) >60 mL/min   Anion gap 10 5 - 15  Protime-INR     Status: Abnormal   Collection Time: 09/20/21  4:50 PM  Result Value Ref Range   Prothrombin Time 15.8 (H) 11.4 - 15.2 seconds   INR 1.3 (H) 0.8 - 1.2  APTT     Status: None   Collection Time: 09/20/21  4:50 PM  Result Value Ref Range   aPTT 26 24 - 36 seconds  Magnesium     Status: None   Collection Time: 09/20/21  4:50 PM  Result Value Ref Range   Magnesium 1.7 1.7 - 2.4 mg/dL  Glucose, capillary     Status: Abnormal   Collection Time: 09/20/21  6:16 PM  Result Value Ref Range   Glucose-Capillary 133 (H) 70 - 99 mg/dL   Recent Results (from the past 240 hour(s))  SARS CORONAVIRUS 2 (TAT 6-24 HRS) Anterior Nasal Swab     Status: None   Collection Time: 09/18/21  2:53 PM   Specimen: Anterior Nasal Swab  Result Value Ref Range Status   SARS Coronavirus 2 NEGATIVE NEGATIVE Final    Comment: (NOTE) SARS-CoV-2 target nucleic acids are NOT DETECTED.  The  SARS-CoV-2 RNA is generally detectable in upper and lower respiratory specimens during the acute phase of infection. Negative results do not preclude SARS-CoV-2 infection, do not rule out co-infections with other pathogens, and should not be used as the sole basis for treatment or other patient management decisions. Negative results must be combined with clinical observations, patient history, and epidemiological information. The expected result is Negative.  Fact Sheet for Patients: SugarRoll.be  Fact Sheet for Healthcare Providers: https://www.woods-mathews.com/  This test is not yet approved or cleared by the [place] FDA and  has been authorized for detection and/or diagnosis of SARS-CoV-2 by FDA under an Emergency Use Authorization (EUA). This EUA will remain  in effect (meaning this test can be used) for the duration of the COVID-19 declaration under Se ction 564(b)(1) of the Act, 21 U.S.C. section 360bbb-3(b)(1), unless the authorization is terminated or revoked sooner.  Performed at [facility], [place] [address] [place], [place] [address]   Surgical pcr screen     Status: Abnormal   Collection Time: 09/18/21  2:53 PM   Specimen: Nasal Mucosa; Nasal Swab  Result Value Ref Range Status   MRSA, PCR NEGATIVE NEGATIVE Final   Staphylococcus aureus POSITIVE (A) NEGATIVE Final    Comment: (NOTE) The Xpert SA Assay (FDA approved for NASAL specimens in patients 92 years of age and older), is one component of a comprehensive surveillance program. It is not intended to diagnose infection nor to guide or monitor treatment. Performed at [facility], [place] [address] [place], [place] [address]     Renal Function: Recent Labs    09/18/21 1452 09/20/21 1528 09/20/21 1650  CREATININE 1.13* 0.94 1.06*   Estimated Creatinine Clearance: 39.5 mL/min (A) (by C-G formula based on SCr of 1.06 mg/dL (H)).  Radiologic  Imaging: ECHOCARDIOGRAM COMPLETE  Result Date: 09/20/2021    ECHOCARDIOGRAM REPORT   Patient Name:   Teresa [name] Date of Exam: 09/20/2021 Medical Rec #:  [id number]  Height:       59.0 in Accession #:    [id number]       Weight:       128.0 lb Date of Birth:  Apr 11, 1950       BSA:          1.526 m Patient Age:    70 years         BP:           113/75 mmHg Patient Gender: F                HR:           58 bpm. Exam Location:  Inpatient Procedure: 2D Echo Indications:    status post aortobifemoral bypass surgery. hypotension. EKG                 changes.  History:        Patient has prior history of Echocardiogram examinations, most                 recent 04/21/2021. CAD, COPD; Risk Factors:Dyslipidemia.                 Aortic Valve: 23 mm Sapien prosthetic, stented (TAVR) valve is                 present in the aortic position.  Sonographer:    Johny Chess RDCS Referring Phys: [place]  1. Left ventricular ejection fraction, by estimation, is 55 to 60%. The left ventricle has normal function. The left ventricle demonstrates regional wall motion abnormalities (see scoring diagram/findings for description). 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 There is mild concentric left ventricular hypertrophy. Left ventricular diastolic parameters are consistent with Grade I diastolic dysfunction (impaired relaxation). No LV thrombus present.  2. Right ventricular systolic function is normal. The right ventricular size is normal. There is mildly elevated pulmonary artery systolic pressure.  3. The mitral valve is abnormal. Mild mitral valve regurgitation. Moderate mitral annular calcification.  4. The aortic valve has been repaired/replaced. There is a 23 mm Sapien prosthetic (TAVR) valve present in the aortic position. Echo findings are consistent with normal structure and function of the aortic valve prosthesis. Aortic valve mean gradient measures 16.0 mmHg. Aortic valve Vmax measures 2.78 m/s. DI  0.7. There is trivial perivalvular leak present at the 9 o'clock position on SAX view.  5. The inferior vena cava is normal in size with greater than 50% respiratory variability, suggesting right atrial pressure of 3 mmHg. Comparison(s): Compared to prior TTE on 04/2021, the EF appears improved to 55-60%, there continues to be a trivial perivalvular leak. Prior mean gradient 14.82mmHg which is relatively unchanged on current study. LV apex is known to be aneurysmal with no LV thrombus on prior or current study. FINDINGS  Left Ventricle: Left ventricular ejection fraction, by estimation, is 55 to 60%. The left ventricle has normal function. The left ventricle demonstrates regional wall motion abnormalities. 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 Definity contrast agent was given IV to delineate the left ventricular endocardial borders. The left ventricular internal cavity size was normal in size. There is mild concentric left ventricular hypertrophy. Left ventricular diastolic parameters are consistent with Grade I diastolic dysfunction (impaired relaxation). No LV thrombus visualized. Right Ventricle: The right ventricular size is normal. No increase in right ventricular wall thickness. Right ventricular systolic function is normal. There is mildly elevated pulmonary artery systolic pressure. The tricuspid regurgitant velocity is 3.23  m/s, and  with an assumed right atrial pressure of 3 mmHg, the estimated right ventricular systolic pressure is 22.2 mmHg. Left Atrium: Left atrial size was normal in size. Right Atrium: Right atrial size was normal in size. Pericardium: There is no evidence of pericardial effusion. Mitral Valve: The mitral valve is abnormal. There is mild thickening of the mitral valve leaflet(s). There is mild calcification of the mitral valve leaflet(s). Moderate mitral annular calcification. Mild mitral valve regurgitation. Tricuspid Valve: The tricuspid valve is normal in structure. Tricuspid valve  regurgitation is mild. Aortic Valve: Trivial paravavlular leak at the 9 o'clock position on SAX view. The aortic valve has been repaired/replaced. Aortic valve mean gradient measures 16.0 mmHg. Aortic valve peak gradient measures 30.9 mmHg. Aortic valve area, by VTI measures 1.21 cm. There is a 23 mm Sapien prosthetic, stented (TAVR) valve present in the aortic position. Echo findings are consistent with normal structure and function of the aortic valve prosthesis. Pulmonic Valve: The pulmonic valve was normal in structure. Pulmonic valve regurgitation is trivial. Aorta: The aortic root and ascending aorta are structurally normal, with no evidence of dilitation. Venous: The inferior vena cava is normal in size with greater than 50% respiratory variability, suggesting right atrial pressure of 3 mmHg. IAS/Shunts: The atrial septum is grossly normal.  LEFT VENTRICLE PLAX 2D LVIDd:         4.50 cm   Diastology LVIDs:         2.90 cm   LV e' medial:    7.07 cm/s LV PW:         0.90 cm   LV E/e' medial:  10.2 LV IVS:        0.80 cm   LV e' lateral:   7.72 cm/s LVOT diam:     1.50 cm   LV E/e' lateral: 9.3 LV SV:         79 LV SV Index:   52 LVOT Area:     1.77 cm  RIGHT VENTRICLE             IVC RV S prime:     12.70 cm/s  IVC diam: 1.40 cm TAPSE (M-mode): 1.5 cm LEFT ATRIUM             Index        RIGHT ATRIUM          Index LA diam:        3.20 cm 2.10 cm/m   RA Area:     9.04 cm LA Vol (A2C):   32.0 ml 20.97 ml/m  RA Volume:   16.40 ml 10.75 ml/m LA Vol (A4C):   41.1 ml 26.94 ml/m LA Biplane Vol: 36.8 ml 24.12 ml/m  AORTIC VALVE AV Area (Vmax):    1.24 cm AV Area (Vmean):   1.23 cm AV Area (VTI):     1.21 cm AV Vmax:           278.00 cm/s AV Vmean:          185.000 cm/s AV VTI:            0.656 m AV Peak Grad:      30.9 mmHg AV Mean Grad:      16.0 mmHg LVOT Vmax:         194.33 cm/s LVOT Vmean:        128.667 cm/s LVOT VTI:          0.449 m LVOT/AV VTI ratio: 0.68  AORTA Ao Asc diam: 2.70 cm MITRAL  VALVE                 TRICUSPID VALVE MV Area (PHT): 2.76 cm     TR Peak grad:   41.7 mmHg MV Decel Time: 275 msec     TR Vmax:        323.00 cm/s MV E velocity: 71.80 cm/s MV A velocity: 129.00 cm/s  SHUNTS MV E/A ratio:  0.56         Systemic VTI:  0.45 m                             Systemic Diam: 1.50 cm Gwyndolyn Kaufman MD Electronically signed by Gwyndolyn Kaufman MD Signature Date/Time: 09/20/2021/5:54:29 PM    Final    DG Chest Port 1 View  Result Date: 09/20/2021 CLINICAL DATA:  Status post aortobifemoral bypass surgery EXAM: PORTABLE CHEST 1 VIEW COMPARISON:  08/26/2018 FINDINGS: Cardiomegaly. Aortic valve stent endograft. Suture material projects over the right hemithorax. The visualized skeletal structures are unremarkable. IMPRESSION: 1. Cardiomegaly status post aortic valve stent endograft. 2. No acute abnormality of the lungs in AP portable projection. Electronically Signed   By: Delanna Ahmadi M.D.   On: 09/20/2021 15:21   DG Abd Portable 1V  Result Date: 09/20/2021 CLINICAL DATA:  [id number] status post aortobifemoral bypass surgery for aortoiliac occlusive disease EXAM: PORTABLE ABDOMEN - 1 VIEW COMPARISON:  None Available. FINDINGS: The bowel gas pattern is normal. Moderately severe atheromatous calcifications of the abdominal aorta and iliac arteries. Elevation of the right hemidiaphragm. IMPRESSION: Bowel-gas pattern is nonobstructive. Atherosclerosis of the abdominal aorta and iliac arteries. Electronically Signed   By: Frazier Richards M.D.   On: 09/20/2021 15:17    I independently reviewed the above imaging studies.  Impression/Recommendation:  #1.  Gross hematuria: Likely due to venous congestion after ligation of her left renal vein 2. Atrophic right kidney: Seen on previous CT imaging  -70 French Foley catheter in place with pink-tinged gross hematuria.  I irrigated her Foley catheter with 250 cc normal saline with only light pink-tinged urine and no evidence of clots and no difficulty  with irrigation. -Discussed with nursing to periodically manually irrigate if decreased drainage or clots -I discussed with patient that gross hematuria likely resolve with time. -Trend creatinine.  I do suspect a component of ATN. -Following peripherally.  Please call with questions.  Matt R. Jamaul Heist MD 09/20/2021, 6:34 PM  [facility]  Pager: [phone]   CC: Noemi Chapel, DO

## 2021-09-20 NOTE — Progress Notes (Signed)
  Echocardiogram 2D Echocardiogram has been performed.  Teresa [name] 09/20/2021, 5:29 PM

## 2021-09-20 NOTE — Transfer of Care (Signed)
Immediate Anesthesia Transfer of Care Note  Patient: Teresa [name]  Procedure(s) Performed: AORTA BIFEMORAL BYPASS GRAFT WITH EPIDURAL CATHETER and  aortic endartarectomey and bilateral femoral endartarectomies. (Bilateral: Abdomen)  Patient Location: PACU  Anesthesia Type:General and Epidural  Level of Consciousness: awake, drowsy and patient cooperative  Airway & Oxygen Therapy: Patient Spontanous Breathing and Patient connected to nasal cannula oxygen  Post-op Assessment: Report given to RN, Post -op Vital signs reviewed and stable and Patient moving all extremities  Post vital signs: Reviewed and stable  Last Vitals:  Vitals Value Taken Time  BP 105/77 09/20/21 1452  Temp    Pulse 50 09/20/21 1457  Resp 17 09/20/21 1457  SpO2 100 % 09/20/21 1457  Vitals shown include unvalidated device data.  Last Pain:  Vitals:   09/20/21 0649  TempSrc:   PainSc: 0-No pain         Complications: No notable events documented.

## 2021-09-20 NOTE — Anesthesia Procedure Notes (Signed)
Arterial Line Insertion Start/End7/19/2023 8:00 AM, 09/20/2021 8:10 AM Performed by: Moshe Salisbury, CRNA, CRNA  Patient location: Pre-op. Preanesthetic checklist: patient identified, IV checked, site marked, risks and benefits discussed, surgical consent, monitors and equipment checked, pre-op evaluation, timeout performed and anesthesia consent Lidocaine 1% used for infiltration and patient sedated Left, radial was placed Catheter size: 20 G Hand hygiene performed , maximum sterile barriers used  and Seldinger technique used  Attempts: 1 Procedure performed without using ultrasound guided technique. Following insertion, Biopatch and dressing applied. Post procedure assessment: normal  Patient tolerated the procedure well with no immediate complications.

## 2021-09-20 NOTE — Interval H&P Note (Signed)
History and Physical Interval Note:  09/20/2021 7:57 AM  Teresa [name]  has presented today for surgery, with the diagnosis of PAD with claudication I70.213.  The various methods of treatment have been discussed with the patient and family. After consideration of risks, benefits and other options for treatment, the patient has consented to  Procedure(s): AORTA BIFEMORAL BYPASS GRAFT WITH EPIDURAL CATHETER (N/A) as a surgical intervention.  The patient's history has been reviewed, patient examined, no change in status, stable for surgery.  I have reviewed the patient's chart and labs.  Questions were answered to the patient's satisfaction.     [name]

## 2021-09-21 ENCOUNTER — Inpatient Hospital Stay (HOSPITAL_COMMUNITY): Payer: PPO

## 2021-09-21 ENCOUNTER — Encounter (HOSPITAL_COMMUNITY): Payer: Self-pay | Admitting: Vascular Surgery

## 2021-09-21 DIAGNOSIS — R579 Shock, unspecified: Secondary | ICD-10-CM

## 2021-09-21 DIAGNOSIS — D62 Acute posthemorrhagic anemia: Secondary | ICD-10-CM | POA: Diagnosis not present

## 2021-09-21 DIAGNOSIS — I7409 Other arterial embolism and thrombosis of abdominal aorta: Secondary | ICD-10-CM | POA: Diagnosis not present

## 2021-09-21 DIAGNOSIS — D696 Thrombocytopenia, unspecified: Secondary | ICD-10-CM | POA: Diagnosis not present

## 2021-09-21 LAB — PREPARE PLATELET PHERESIS: Unit division: 0

## 2021-09-21 LAB — BPAM CRYOPRECIPITATE
Blood Product Expiration Date: 202307191935
ISSUE DATE / TIME: 202307191357
Unit Type and Rh: 6200

## 2021-09-21 LAB — BPAM PLATELET PHERESIS
Blood Product Expiration Date: 202307212359
ISSUE DATE / TIME: 202307191328
Unit Type and Rh: 6200

## 2021-09-21 LAB — PREPARE FRESH FROZEN PLASMA: Unit division: 0

## 2021-09-21 LAB — COMPREHENSIVE METABOLIC PANEL
ALT: 13 U/L (ref 0–44)
AST: 21 U/L (ref 15–41)
Albumin: 3 g/dL — ABNORMAL LOW (ref 3.5–5.0)
Alkaline Phosphatase: 29 U/L — ABNORMAL LOW (ref 38–126)
Anion gap: 7 (ref 5–15)
BUN: 22 mg/dL (ref 8–23)
CO2: 22 mmol/L (ref 22–32)
Calcium: 7.6 mg/dL — ABNORMAL LOW (ref 8.9–10.3)
Chloride: 109 mmol/L (ref 98–111)
Creatinine, Ser: 0.93 mg/dL (ref 0.44–1.00)
GFR, Estimated: >60 mL/min (ref >60)
Glucose, Bld: 125 mg/dL — ABNORMAL HIGH (ref 70–99)
Potassium: 4.4 mmol/L (ref 3.5–5.1)
Sodium: 138 mmol/L (ref 135–145)
Total Bilirubin: 1.1 mg/dL (ref 0.3–1.2)
Total Protein: 5.2 g/dL — ABNORMAL LOW (ref 6.5–8.1)

## 2021-09-21 LAB — CBC
HCT: 34.2 % — ABNORMAL LOW (ref 36.0–46.0)
Hemoglobin: 11.4 g/dL — ABNORMAL LOW (ref 12.0–15.0)
MCH: 31 pg (ref 26.0–34.0)
MCHC: 33.3 g/dL (ref 30.0–36.0)
MCV: 92.9 fL (ref 80.0–100.0)
Platelets: 86 10*3/uL — ABNORMAL LOW (ref 150–400)
RBC: 3.68 MIL/uL — ABNORMAL LOW (ref 3.87–5.11)
RDW: 15.3 % (ref 11.5–15.5)
WBC: 10.3 10*3/uL (ref 4.0–10.5)
nRBC: 0 % (ref 0.0–0.2)

## 2021-09-21 LAB — MAGNESIUM: Magnesium: 1.8 mg/dL (ref 1.7–2.4)

## 2021-09-21 LAB — BPAM FFP
Blood Product Expiration Date: 202307222359
ISSUE DATE / TIME: 202307191327
Unit Type and Rh: 6200

## 2021-09-21 LAB — PREPARE CRYOPRECIPITATE: Unit division: 0

## 2021-09-21 LAB — GLUCOSE, CAPILLARY
Glucose-Capillary: 156 mg/dL — ABNORMAL HIGH (ref 70–99)
Glucose-Capillary: 135 mg/dL — ABNORMAL HIGH (ref 70–99)
Glucose-Capillary: 115 mg/dL — ABNORMAL HIGH (ref 70–99)
Glucose-Capillary: 102 mg/dL — ABNORMAL HIGH (ref 70–99)
Glucose-Capillary: 91 mg/dL (ref 70–99)

## 2021-09-21 LAB — AMYLASE: Amylase: 30 U/L (ref 28–100)

## 2021-09-21 MED ORDER — MAGNESIUM SULFATE 2 GM/50ML IV SOLN
2.0000 g | Freq: Once | INTRAVENOUS | Status: AC
  Administered 2021-09-21: 2 g via INTRAVENOUS
  Filled 2021-09-21: qty 50

## 2021-09-21 MED ORDER — LACTATED RINGERS IV BOLUS
1000.0000 mL | Freq: Once | INTRAVENOUS | Status: AC
  Administered 2021-09-21: 1000 mL via INTRAVENOUS

## 2021-09-21 MED ORDER — HEPARIN SODIUM (PORCINE) 5000 UNIT/ML IJ SOLN
5000.0000 [IU] | Freq: Three times a day (TID) | INTRAMUSCULAR | Status: DC
  Administered 2021-09-21 – 2021-09-22 (×3): 5000 [IU] via SUBCUTANEOUS
  Filled 2021-09-21 (×3): qty 1

## 2021-09-21 MED ORDER — ROSUVASTATIN CALCIUM 5 MG PO TABS
5.0000 mg | ORAL_TABLET | ORAL | Status: DC
  Administered 2021-09-22 – 2021-09-25 (×3): 5 mg via ORAL
  Filled 2021-09-21 (×3): qty 1

## 2021-09-21 MED ORDER — ALBUMIN HUMAN 25 % IV SOLN
25.0000 g | Freq: Once | INTRAVENOUS | Status: AC
  Administered 2021-09-21: 25 g via INTRAVENOUS
  Filled 2021-09-21: qty 100

## 2021-09-21 MED ORDER — ORAL CARE MOUTH RINSE: 15.0000 mL | OROMUCOSAL | Status: DC | PRN

## 2021-09-21 NOTE — Progress Notes (Addendum)
Looks good on evening check. Brisk doppler flow in feet. Incisions with some bruising, but otherwise healing well.  Urine output remains marginal, but clear. No change in UOP with fluid challenge today. Remains on Neo @ 60. Likely 2/2 epidural. Discussed with Dr. Carlis Abbott. Agree with liberalizing BP goals. Continue to mobilize as able with PT / OT. Keep epidural x 5 days. Resume SQH until day before epidural removed.   Teresa [name]. Stanford Breed, MD [facility] Phone Number: [phone] 09/21/2021 7:40 PM

## 2021-09-21 NOTE — Progress Notes (Signed)
NAME:  Teresa [name], MRN:  [id number], DOB:  [birth year], LOS: 1 ADMISSION DATE:  09/20/2021, CONSULTATION DATE:  7/19 REFERRING MD:  Stanford Breed REASON FOR CONSULT:  hypotension, blood loss   History of Present Illness:   Teresa [name], is a 71 y.o. female, who presented to [facility] for a planned aortobifemoral bypass graft with Dr. Stanford Breed.  They have a pertinent past medical history of atherosclerosis, AAA, coronary artery disease status post stents, lung cancer, COPD, GERD, ischemic cardiomyopathy, peripheral vascular disease, TAVR  The patient underwent an infrarenal abdominal aortic endarterectomy, aorto-bifemoral bypass, bilateral common femoral endarterectomy.  The procedure had blood loss of 2518ml.  2 units PRBCs, 1 unit FFP, 1 unit of cryo was given, 650 of blood returned via Cell Saver.  Of note renal vein needed dividing during the procedure which resulted in hematuria.  Patient also received 2 L IVF, 1 L albumin.  PCCM was consulted for assistance with blood loss and hypotension.  Patient transferred to ICU postprocedure.  Pertinent  Medical History  AAA, coronary artery disease status post stents, lung cancer, COPD, GERD, ischemic cardiomyopathy, peripheral vascular disease, TAVR  Significant Hospital Events: Including procedures, antibiotic start and stop dates in addition to other pertinent events   7/19 presented to [facility] for planned aortobifemoral bypass graft, PCCM consulted, tenets PRBCs, 1 unit FFP, 1 unit of cryo, 2.5 L blood loss  Interim History / Subjective:   Pain in bilateral groin and abdomen with movement. No pain with deep breathing Requiring 2L oxygen to keep sats in the 90s.  Phenylephrine 60 mic  Thoracic epidural catheter with ropivacaine52mL/Hr   Objective   Blood pressure (!) 87/34, pulse (!) 49, temperature 97.6 F (36.4 C), temperature source Oral, resp. rate (!) 9, height 4\' 11"  (1.499 m), weight 58.1 kg, last menstrual period  10/04/1999, SpO2 96 %.        Intake/Output Summary (Last 24 hours) at 09/21/2021 0842 Last data filed at 09/21/2021 0600 Gross per 24 hour  Intake 7321.69 ml  Output 3215 ml  Net 4106.69 ml    Filed Weights   09/20/21 0644  Weight: 58.1 kg    Examination:  General: Elderly female resting comfortably in bed  HEENT: [place]/AT, PERRL, no JVD Neuro: Alert, oriented, non-focal, no appreciable JVD CV: RRR, no MRG PULM: Clear bilateral breath sounds GI: Soft, non-tender, non-distended. Surgical dressing in place, clean, dry, intact.  GU:  Foley in place draining clear dark urine.  Extremities: no edema +1 peal pulses bilateral. Skin: Midline abdominal incision closed with Surgicel, bilateral groin sites closed with Surgicel, C/D/I, no rashes or lesions noted  Labs/Imaging: Platelets 86 ACT 89 CMP pending Hemoglobin 11.4, down from 12.5 yesterday afternoon.   Echo 7/20> LVEF 55-60%. Regional wall motion abnormalities identified. 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 Grade 1 DD. Mild MR. TAVR valve present. IVC suggesting low RA pressure.  Resolved Hospital Problem list     Assessment & Plan:  Shock, multifactorial, suspect hypovolemic and medication induced.  ABLA secondary to aorto-bifemoral bypass Thrombocytopenia secondary to consumption r/t surgery and hematuria.  The procedure had blood loss of 2554mL.  2 units PRBCs, 1 unit FFP, 1 unit of cryo was given, 650 of blood returned via Cell Saver. Patient also received 2 L IVF, 1 L albumin.  Patient with thoracic epidural catheter in place which could contribute to hypotension.  Also unable to account for insensible loss of open abdomen. -Goal MAP 65.  Continue phenylephrine peripherally.  -Transfuse  PRBC if HBG less than 7 -Albumin and crystalloid ordered by VVS  -LR at 75 -Follow up echo -Trend CBC  POD 1 s/p infrarenal abdominal aortic endarterectomy, aorto-bifemoral bypass, bilateral common femoral endarterectomy with Dr.  Stanford Breed -Management per VVS -Pain control via thoracic epidural catheter. Management per anesthesia  -PRN dilaudid  Hematuria : renal vein needed dividing during the procedure which resulted in hematuria. -Continue foley -Urology consulting, recommended routine foley flushes. -Trend UOP  COPD without evidence of acute exacerbation.  Hypoxia: acutely. Suspect secondary to atelectasis post op.  HX of SCC s/p RLL lobectomy in 2014 with Dr. Cyndia Bent On trelegy at home - Incruse and Memory Dance in place of home Trellegy - Incentive spirometry  HX CAD s/p stents PAD HTN HLD HX TAVR AAA Ischemic cardiomyopathy Echo 7/20> LVEF 55-60%. Regional wall motion abnormalities identified. 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 Grade 1 DD. Mild MR. TAVR valve present. IVC suggesting low RA pressure. -Hold home entresto, spironolactone, empagliflozin, bisoprolol, for now.   -Resume home crestor -Plavix per vascular surgery -Continuous telemetry  GERD -PPI   Best Practice (right click and "Reselect all SmartList Selections" daily)   Diet/type: NPO w/ oral meds DVT prophylaxis: SCD GI prophylaxis: PPI Lines: N/A Foley:  Yes, and it is still needed Code Status:  full code Last date of multidisciplinary goals of care discussion [pending]   Critical care time: 32 minutes     Georgann Housekeeper, AGACNP-BC [place] for personal pager PCCM on call pager [phone] until 7pm. Please call Elink 7p-7a. [phone]  09/21/2021 10:22 AM     ,

## 2021-09-21 NOTE — Evaluation (Signed)
Occupational Therapy Evaluation Patient Details Name: Teresa [name] MRN: [id number] DOB: [birth year] Today's Date: 09/21/2021   History of Present Illness 71 yo female admitted 7/18 for aortobifem BPG which required left renal vein ligation operatively with post op hematuria. PMHx: COPD, CAD, TAVR, CHF, GERD, HLD, ICM, PVD, lung CA s/p Rt lobectomy   Clinical Impression   Patient admitted for the diagnosis and procedures above.  PTA she lives with her spouse, who can assist as needed.  Patient states she was able to walk short distances only prior to stopping and resting due to leg pain, but was otherwise able to care for herself.  Spouse assists with iADL.  Currently she is limited to bed to chair only, but is needing up to Mod A for basic transfers, and lower body ADL from a sit/stand level.  OT will follow in the acute setting, and HH OT can be considered depending on progress.        Recommendations for follow up therapy are one component of a multi-disciplinary discharge planning process, led by the attending physician.  Recommendations may be updated based on patient status, additional functional criteria and insurance authorization.   Follow Up Recommendations  Follow physician's recommendations for discharge plan and follow up therapies    Assistance Recommended at Discharge Intermittent Supervision/Assistance  Patient can return home with the following A little help with walking and/or transfers;A little help with bathing/dressing/bathroom;Assistance with cooking/housework;Assist for transportation;Help with stairs or ramp for entrance    Functional Status Assessment  Patient has had a recent decline in their functional status and demonstrates the ability to make significant improvements in function in a reasonable and predictable amount of time.  Equipment Recommendations  Tub/shower seat    Recommendations for Other Services       Precautions / Restrictions  Precautions Precautions: Fall Precaution Comments: epidural pain pump( bed <>chair only until removed) Restrictions Weight Bearing Restrictions: No      Mobility Bed Mobility               General bed mobility comments: up in the recliner    Transfers Overall transfer level: Needs assistance   Transfers: Sit to/from Stand Sit to Stand: Min assist                  Balance Overall balance assessment: Needs assistance Sitting-balance support: Feet supported Sitting balance-Leahy Scale: Fair Sitting balance - Comments: minguard for sitting EOB, patient afraid she may damage stitches   Standing balance support: Bilateral upper extremity supported Standing balance-Leahy Scale: Poor Standing balance comment: No RW used                           ADL either performed or assessed with clinical judgement   ADL Overall ADL's : Needs assistance/impaired Eating/Feeding: NPO Eating/Feeding Details (indicate cue type and reason): sips with meds Grooming: Wash/dry hands;Wash/dry face;Set up;Sitting           Upper Body Dressing : Moderate assistance;Sitting   Lower Body Dressing: Moderate assistance;Sit to/from stand   Toilet Transfer: Minimal assistance;Stand-pivot;BSC/3in1                   Vision Patient Visual Report: No change from baseline       Perception Perception Perception: Not tested   Praxis Praxis Praxis: Not tested    Pertinent Vitals/Pain Pain Assessment Pain Assessment: Faces Faces Pain Scale: Hurts little more Pain Location: left groin and abdomen  Pain Descriptors / Indicators: Discomfort, Grimacing, Guarding Pain Intervention(s): Monitored during session     Hand Dominance Right   Extremity/Trunk Assessment Upper Extremity Assessment Upper Extremity Assessment: Overall WFL for tasks assessed   Lower Extremity Assessment Lower Extremity Assessment: Defer to PT evaluation LLE Deficits / Details: decreased hip  ROM and strength due to pain   Cervical / Trunk Assessment Cervical / Trunk Assessment: Normal   Communication Communication Communication: No difficulties   Cognition Arousal/Alertness: Awake/alert Behavior During Therapy: WFL for tasks assessed/performed Overall Cognitive Status: Within Functional Limits for tasks assessed                                       General Comments   VSS on RA    Exercises     Shoulder Instructions      Home Living Family/patient expects to be discharged to:: Private residence Living Arrangements: Spouse/significant other;Children Available Help at Discharge: Family;Available 24 hours/day Type of Home: House Home Access: Stairs to enter [employer] of Steps: 1   Home Layout: One level     Bathroom Shower/Tub: Tub/shower unit;Sponge bathes at baseline   [employer]: Handicapped height Bathroom Accessibility: Yes How Accessible: Accessible via walker Home Equipment: [place] (2 wheels);Cane - single point;Wheelchair - manual          Prior Functioning/Environment Prior Level of Function : Independent/Modified Independent             Mobility Comments: normally independent for all mobility and ADLs.  Patient stating she can walk about 52' prior to stopping due to pain. ADLs Comments: spouse does the vacuuming, but patient continues to drive, grocery shop and performs own ADL        OT Problem List: Decreased activity tolerance;Impaired balance (sitting and/or standing);Pain;Decreased knowledge of use of DME or AE      OT Treatment/Interventions: Self-care/ADL training;Patient/family education;Balance training;Therapeutic activities;DME and/or AE instruction    OT Goals(Current goals can be found in the care plan section) Acute Rehab OT Goals Patient Stated Goal: Return home OT Goal Formulation: With patient Time For Goal Achievement: 10/05/21 Potential to Achieve Goals: Good ADL  Goals Pt Will Perform Grooming: with set-up;standing Pt Will Perform Upper Body Dressing: with set-up;sitting Pt Will Perform Lower Body Dressing: with min guard assist;sit to/from stand Pt Will Transfer to Toilet: with modified independence;ambulating;regular height toilet  OT Frequency: Min 2X/week    Co-evaluation              AM-PAC OT "6 Clicks" Daily Activity     Outcome Measure Help from another person eating meals?: Total Help from another person taking care of personal grooming?: None Help from another person toileting, which includes using toliet, bedpan, or urinal?: A Little Help from another person bathing (including washing, rinsing, drying)?: A Lot Help from another person to put on and taking off regular upper body clothing?: A Lot Help from another person to put on and taking off regular lower body clothing?: A Lot 6 Click Score: 14   End of Session Nurse Communication: Mobility status  Activity Tolerance: Patient tolerated treatment well Patient left: in chair;with call bell/phone within reach;with family/visitor present  OT Visit Diagnosis: Unsteadiness on feet (R26.81)                Time: 3710-6269 OT Time Calculation (min): 19 min Charges:  OT General Charges $OT Visit: 1 Visit OT  Evaluation $OT Eval Moderate Complexity: 1 Mod  09/21/2021  RP, OTR/L  Acute Rehabilitation Services  Office:  [phone]   Metta Clines 09/21/2021, 12:09 PM

## 2021-09-21 NOTE — Progress Notes (Addendum)
Progress Note    09/21/2021 7:42 AM 1 Day Post-Op  Subjective:  some low back pain this morning.  Feet feel better compared to pre-op   Vitals:   09/21/21 0630 09/21/21 0736  BP: (!) 87/34   Pulse: (!) 49   Resp: (!) 9   Temp:  97.6 F (36.4 C)  SpO2: 96%    Physical Exam: Lungs:  non labored Incisions:  abd and groin incisions c/d/i Extremities:  palpable L DP; brisk R DP and PT by doppler Abdomen:  soft, generalized tenderness Neurologic: A&O  CBC    Component Value Date/Time   WBC 10.3 09/21/2021 0612   RBC 3.68 (L) 09/21/2021 0612   HGB 11.4 (L) 09/21/2021 0612   HGB 12.6 01/04/2020 1353   HGB 13.9 01/02/2017 0737   HCT 34.2 (L) 09/21/2021 0612   HCT 42.0 01/02/2017 0737   PLT 86 (L) 09/21/2021 0612   PLT 297 01/04/2020 1353   PLT 264 01/02/2017 0737   MCV 92.9 09/21/2021 0612   MCV 89.2 01/02/2017 0737   MCH 31.0 09/21/2021 0612   MCHC 33.3 09/21/2021 0612   RDW 15.3 09/21/2021 0612   RDW 13.8 01/02/2017 0737   LYMPHSABS 2.2 01/04/2020 1353   LYMPHSABS 1.9 01/02/2017 0737   MONOABS 0.6 01/04/2020 1353   MONOABS 0.5 01/02/2017 0737   EOSABS 0.2 01/04/2020 1353   EOSABS 0.2 01/02/2017 0737   BASOSABS 0.1 01/04/2020 1353   BASOSABS 0.1 01/02/2017 0737    BMET    Component Value Date/Time   NA 141 09/20/2021 1650   NA 136 09/03/2018 1122   NA 140 01/02/2017 0737   K 5.2 (H) 09/20/2021 1650   K 4.4 01/02/2017 0737   CL 110 09/20/2021 1650   CO2 21 (L) 09/20/2021 1650   CO2 29 01/02/2017 0737   GLUCOSE 149 (H) 09/20/2021 1650   GLUCOSE 81 01/02/2017 0737   BUN 23 09/20/2021 1650   BUN 16 09/03/2018 1122   BUN 17.2 01/02/2017 0737   CREATININE 1.06 (H) 09/20/2021 1650   CREATININE 0.91 01/04/2020 1353   CREATININE 0.7 01/02/2017 0737   CALCIUM 7.4 (L) 09/20/2021 1650   CALCIUM 9.7 01/02/2017 0737   GFRNONAA 57 (L) 09/20/2021 1650   GFRNONAA >60 01/04/2020 1353   GFRAA >60 04/30/2019 1030   GFRAA >60 01/05/2019 0950    INR     Component Value Date/Time   INR 1.3 (H) 09/20/2021 1650     Intake/Output Summary (Last 24 hours) at 09/21/2021 4403 Last data filed at 09/21/2021 0600 Gross per 24 hour  Intake 7321.69 ml  Output 3215 ml  Net 4106.69 ml     Assessment/Plan:  71 y.o. female is s/p ABF with B CFA endarterectomies 1 Day Post-Op   BLE well perfused on exam Pulm: non labored; encouraged IS Heart: 474-259 systolic, a line and BP cuff correlate well; d/c a line if it continues to have issues GI: sips and ice chips Renal: about 30cc urine per hour; continue IVF; continue foley; daily BMP OOB to chair   Dagoberto Ligas, PA-C [facility] [phone] 09/21/2021 7:42 AM   VASCULAR STAFF ADDENDUM: I have independently interviewed and examined the patient. I agree with the above.  Looks great POD#1 ABF Brisk doppler flow in feet bilaterally Hematuria has cleared - suspect this is related to left renal vein division. Kidney function normal today. Urine output marginal. Will give some more fluid.  Mobilize as able with thoracic epidural catheter. Keep thoracic epidural  catheter in place.  Continue Ancef while catheter is in place.   Yevonne Aline. Stanford Breed, MD [facility] Phone Number: [phone] 09/21/2021 10:56 AM

## 2021-09-21 NOTE — Anesthesia Post-op Follow-up Note (Signed)
  Anesthesia Pain Follow-up Note  Patient: Teresa [name]  Day #: 1  Date of Follow-up: 09/21/2021 Time: 9:16 AM  Last Vitals:  Vitals:   09/21/21 0630 09/21/21 0736  BP: (!) 87/34   Pulse: (!) 49   Resp: (!) 9   Temp:  36.4 C  SpO2: 96%     Level of Consciousness: alert  Pain: mild   Side Effects:None  Catheter Site Exam:clean  Anti-Coag Meds (From admission, onward)   Start     Dose/Rate Route Frequency Ordered Stop   09/21/21 1400  heparin injection 5,000 Units        5,000 Units Subcutaneous Every 8 hours 09/20/21 1609      Epidural / Intrathecal (From admission, onward)   Start     Dose/Rate Route Frequency Ordered Stop   09/20/21 1945  ropivacaine (PF) 2 mg/mL (0.2%) (NAROPIN) injection        10 mL/hr 10 mL/hr  Epidural Continuous 09/20/21 1852         Plan: Continue current therapy of postop epidural at surgeon's request   - POD #1. Site c/d/i. Pain well controlled without complication. Will continue at 10cc/hr.   [place]

## 2021-09-21 NOTE — Care Management (Signed)
  Transition of Care [facility]) Screening Note   Patient Details  Name: Teresa [name] Date of Birth: 09/09/1950   Transition of Care [facility]) CM/SW Contact:    Bethena Roys, RN Phone Number: 09/21/2021, 3:42 PM    Transition of Care Department [facility]) has reviewed the patient and no TOC needs have been identified at this time. We will continue to monitor patient advancement through interdisciplinary progression rounds. Patient is being followed by [facility] ([place]) with the MD office protocol referring for prearranged  home health. Case Manager will continue to follow for additional transition of care needs.

## 2021-09-21 NOTE — Evaluation (Signed)
Physical Therapy Evaluation Patient Details Name: LOYS [name] MRN: [id number] DOB: 10/29/1950 Today's Date: 09/21/2021  History of Present Illness  71 yo female admitted 7/18 for aortobifem BPG which required left renal vein ligation operatively with post op hematuria. PMHx: COPD, CAD, TAVR, CHF, GERD, HLD, ICM, PVD, lung CA s/p Rt lobectomy  Clinical Impression  Pt pleasant and able to tolerate bed to chair with epidural line in place and secured. RN present throughout to assist with lines. Pt educated for bil LE HEP and progressive mobility once epidural line removed. Pt with decreased strength, ROM and function who will benefit from acute therapy to maximize mobility, safety and function to decrease burden of care.   SpO2 90% on RA HR 49-54 Art BP132/50       Recommendations for follow up therapy are one component of a multi-disciplinary discharge planning process, led by the attending physician.  Recommendations may be updated based on patient status, additional functional criteria and insurance authorization.  Follow Up Recommendations Home health PT      Assistance Recommended at Discharge    Patient can return home with the following  A little help with walking and/or transfers;A little help with bathing/dressing/bathroom;Assistance with cooking/housework;Assist for transportation;Help with stairs or ramp for entrance    Equipment Recommendations BSC/3in1  Recommendations for Other Services       Functional Status Assessment Patient has had a recent decline in their functional status and demonstrates the ability to make significant improvements in function in a reasonable and predictable amount of time.     Precautions / Restrictions Precautions Precautions: Other (comment);Fall Precaution Comments: epidural pain pump( bed <>chair only until removed)      Mobility  Bed Mobility Overal bed mobility: Needs Assistance Bed Mobility: Rolling, Sidelying to  Sit Rolling: Min assist Sidelying to sit: Mod assist       General bed mobility comments: cues for sequence with assist of pad to roll and rise from sequence with monitoring epidural line for security throughout    Transfers Overall transfer level: Needs assistance   Transfers: Sit to/from Stand, Bed to chair/wheelchair/BSC Sit to Stand: Mod assist Stand pivot transfers: Min assist         General transfer comment: mod assist with pad and knees blocked to rise from surface with face to face technique and bil UE on therapist elbows, pivot with limited stepping from bed to chair    Ambulation/Gait                  Stairs            Wheelchair Mobility    Modified Rankin (Stroke Patients Only)       Balance Overall balance assessment: Needs assistance   Sitting balance-Leahy Scale: Fair Sitting balance - Comments: minguard for sitting EOB   Standing balance support: Bilateral upper extremity supported Standing balance-Leahy Scale: Poor Standing balance comment: min assist for standing                             Pertinent Vitals/Pain Pain Assessment Pain Assessment: 0-10 Pain Score: 6  Pain Location: left groin Pain Descriptors / Indicators: Aching, Guarding Pain Intervention(s): Limited activity within patient's tolerance, Monitored during session, Repositioned    Home Living Family/patient expects to be discharged to:: Private residence Living Arrangements: Spouse/significant other;Children Available Help at Discharge: Family;Available 24 hours/day Type of Home: House Home Access: Stairs to enter   [employer]  of Steps: 1   Home Layout: One level Home Equipment: Conservation officer, nature (2 wheels);Cane - single point;Wheelchair - manual      Prior Function Prior Level of Function : Independent/Modified Independent             Mobility Comments: normally independent for all mobility and ADLs ADLs Comments: spouse does  the vacuuming     Hand Dominance        Extremity/Trunk Assessment   Upper Extremity Assessment Upper Extremity Assessment: Overall WFL for tasks assessed    Lower Extremity Assessment Lower Extremity Assessment: LLE deficits/detail LLE Deficits / Details: decreased hip ROM and strength due to pain    Cervical / Trunk Assessment Cervical / Trunk Assessment: Normal  Communication   Communication: No difficulties  Cognition Arousal/Alertness: Awake/alert Behavior During Therapy: WFL for tasks assessed/performed Overall Cognitive Status: Within Functional Limits for tasks assessed                                          General Comments      Exercises General Exercises - Lower Extremity Long Arc Quad: AROM, Both, 10 reps, Seated Heel Slides: AAROM, Both, 5 reps, Supine   Assessment/Plan    PT Assessment Patient needs continued PT services  PT Problem List Decreased strength;Decreased mobility;Decreased activity tolerance;Decreased balance;Decreased knowledge of use of DME;Pain       PT Treatment Interventions Gait training;Balance training;Functional mobility training;Therapeutic activities;Stair training;DME instruction;Therapeutic exercise;Patient/family education    PT Goals (Current goals can be found in the Care Plan section)  Acute Rehab PT Goals Patient Stated Goal: return home and care for the kids (keeps 2 children) PT Goal Formulation: With patient/family Time For Goal Achievement: 10/05/21 Potential to Achieve Goals: Good    Frequency Min 3X/week     Co-evaluation               AM-PAC PT "6 Clicks" Mobility  Outcome Measure Help needed turning from your back to your side while in a flat bed without using bedrails?: A Little Help needed moving from lying on your back to sitting on the side of a flat bed without using bedrails?: A Little Help needed moving to and from a bed to a chair (including a wheelchair)?: A Little Help  needed standing up from a chair using your arms (e.g., wheelchair or bedside chair)?: A Little Help needed to walk in hospital room?: Total Help needed climbing 3-5 steps with a railing? : Total 6 Click Score: 14    End of Session   Activity Tolerance: Patient tolerated treatment well Patient left: in chair;with call bell/phone within reach;with chair alarm set;with nursing/sitter in room;with family/visitor present Nurse Communication: Mobility status PT Visit Diagnosis: Other abnormalities of gait and mobility (R26.89);Difficulty in walking, not elsewhere classified (R26.2);Muscle weakness (generalized) (M62.81)    Time: 8115-7262 PT Time Calculation (min) (ACUTE ONLY): 21 min   Charges:   PT Evaluation $PT Eval Moderate Complexity: 1 Mod          [place], PT Acute Rehabilitation Services Office: [phone]   Teresa [name] 09/21/2021, 9:30 AM

## 2021-09-21 NOTE — Addendum Note (Signed)
Addendum  created 09/21/21 1057 by Josephine Igo, CRNA   Order list changed, Pharmacy for encounter modified

## 2021-09-21 NOTE — Progress Notes (Signed)
Remains on Neo at 44mcg with MAP >65. Discussed with Dr. Stanford Breed, wide pulse pressure due to low diastolics. Ok to trial SBP goal 100-120 as long as MAPs remain >50 to titrate down pressors. If she has a cahnge in mental status or drop in UOP, then need to change back to MAP goal 65. D/w RNs at bedside and order updated.  Julian Hy, DO 09/21/21 4:58 PM [facility]

## 2021-09-21 NOTE — Addendum Note (Signed)
Addendum  created 09/21/21 0918 by Darral Dash, DO   Clinical Note Signed

## 2021-09-22 ENCOUNTER — Inpatient Hospital Stay (HOSPITAL_COMMUNITY): Payer: PPO | Admitting: Anesthesiology

## 2021-09-22 DIAGNOSIS — D62 Acute posthemorrhagic anemia: Secondary | ICD-10-CM | POA: Diagnosis not present

## 2021-09-22 DIAGNOSIS — R578 Other shock: Secondary | ICD-10-CM | POA: Diagnosis not present

## 2021-09-22 DIAGNOSIS — I7409 Other arterial embolism and thrombosis of abdominal aorta: Secondary | ICD-10-CM | POA: Diagnosis not present

## 2021-09-22 LAB — PHOSPHORUS: Phosphorus: 2.2 mg/dL — ABNORMAL LOW (ref 2.5–4.6)

## 2021-09-22 LAB — BASIC METABOLIC PANEL
Anion gap: 5 (ref 5–15)
BUN: 16 mg/dL (ref 8–23)
CO2: 22 mmol/L (ref 22–32)
Calcium: 7.9 mg/dL — ABNORMAL LOW (ref 8.9–10.3)
Chloride: 113 mmol/L — ABNORMAL HIGH (ref 98–111)
Creatinine, Ser: 0.86 mg/dL (ref 0.44–1.00)
GFR, Estimated: >60 mL/min (ref >60)
Glucose, Bld: 84 mg/dL (ref 70–99)
Potassium: 4 mmol/L (ref 3.5–5.1)
Sodium: 140 mmol/L (ref 135–145)

## 2021-09-22 LAB — CBC
HCT: 29.9 % — ABNORMAL LOW (ref 36.0–46.0)
Hemoglobin: 9.3 g/dL — ABNORMAL LOW (ref 12.0–15.0)
MCH: 30.4 pg (ref 26.0–34.0)
MCHC: 31.1 g/dL (ref 30.0–36.0)
MCV: 97.7 fL (ref 80.0–100.0)
Platelets: 118 10*3/uL — ABNORMAL LOW (ref 150–400)
RBC: 3.06 MIL/uL — ABNORMAL LOW (ref 3.87–5.11)
RDW: 15 % (ref 11.5–15.5)
WBC: 10.3 10*3/uL (ref 4.0–10.5)
nRBC: 0 % (ref 0.0–0.2)

## 2021-09-22 LAB — PROTIME-INR
INR: 1.3 — ABNORMAL HIGH (ref 0.8–1.2)
Prothrombin Time: 16.1 seconds — ABNORMAL HIGH (ref 11.4–15.2)

## 2021-09-22 LAB — GLUCOSE, CAPILLARY
Glucose-Capillary: 74 mg/dL (ref 70–99)
Glucose-Capillary: 66 mg/dL — ABNORMAL LOW (ref 70–99)
Glucose-Capillary: 173 mg/dL — ABNORMAL HIGH (ref 70–99)
Glucose-Capillary: 104 mg/dL — ABNORMAL HIGH (ref 70–99)

## 2021-09-22 LAB — MAGNESIUM: Magnesium: 2.3 mg/dL (ref 1.7–2.4)

## 2021-09-22 MED ORDER — OXYCODONE HCL 5 MG PO TABS
5.0000 mg | ORAL_TABLET | ORAL | Status: DC | PRN
  Administered 2021-09-22 (×2): 10 mg via ORAL
  Administered 2021-09-23: 5 mg via ORAL
  Administered 2021-09-23 – 2021-09-27 (×11): 10 mg via ORAL
  Filled 2021-09-22 (×11): qty 2
  Filled 2021-09-22: qty 1
  Filled 2021-09-22 (×3): qty 2

## 2021-09-22 MED ORDER — ACETAMINOPHEN 325 MG PO TABS
650.0000 mg | ORAL_TABLET | Freq: Four times a day (QID) | ORAL | Status: DC
  Administered 2021-09-22 – 2021-09-27 (×20): 650 mg via ORAL
  Filled 2021-09-22 (×20): qty 2

## 2021-09-22 MED ORDER — K PHOS MONO-SOD PHOS DI & MONO 155-852-130 MG PO TABS
250.0000 mg | ORAL_TABLET | Freq: Three times a day (TID) | ORAL | Status: AC
  Administered 2021-09-22 (×3): 250 mg via ORAL
  Filled 2021-09-22 (×3): qty 1

## 2021-09-22 MED FILL — Heparin Sodium (Porcine) Inj 1000 Unit/ML: INTRAMUSCULAR | Qty: 30 | Status: AC

## 2021-09-22 MED FILL — Sodium Chloride IV Soln 0.9%: INTRAVENOUS | Qty: 2000 | Status: AC

## 2021-09-22 NOTE — Anesthesia Post-op Follow-up Note (Signed)
  Anesthesia Pain Follow-up Note  Patient: Teresa [name]  Day #: 3  Date of Follow-up: 09/22/2021 Time: 8:47 AM  Last Vitals:  Vitals:   09/22/21 0645 09/22/21 0730  BP: (!) 102/44   Pulse: 77   Resp: 11   Temp:  (!) 36.3 C  SpO2: 97%     Level of Consciousness: alert  Pain: mild   Side Effects:None  Catheter Site Exam:clean, dry, no drainage  Anti-Coag Meds (From admission, onward)   Start     Dose/Rate Route Frequency Ordered Stop   09/21/21 2200  heparin injection 5,000 Units        5,000 Units Subcutaneous Every 8 hours 09/21/21 1942      Epidural / Intrathecal (From admission, onward)   Start     Dose/Rate Route Frequency Ordered Stop   09/20/21 1945  ropivacaine (PF) 2 mg/mL (0.2%) (NAROPIN) injection        10 mL/hr 10 mL/hr  Epidural Continuous 09/20/21 1852         Plan: Continue current therapy of postop epidural at surgeon's request  Plts 118, INR 1.3 Okay to use SQ heparin for DVT prophylaxis while epidural is in place. Must be held for 6 hours prior to epidural removal.   Lidia Collum

## 2021-09-22 NOTE — Inpatient Diabetes Management (Signed)
Inpatient Diabetes Program Recommendations  AACE/ADA: New Consensus Statement on Inpatient Glycemic Control (2015)  Target Ranges:  Prepandial:   less than 140 mg/dL      Peak postprandial:   less than 180 mg/dL (1-2 hours)      Critically ill patients:  140 - 180 mg/dL   Lab Results  Component Value Date   GLUCAP 66 (L) 09/22/2021   HGBA1C 5.5 09/20/2021    Review of Glycemic Control  Latest Reference Range & Units 09/21/21 07:38 09/21/21 11:20 09/21/21 15:58 09/21/21 21:52 09/22/21 07:26 09/22/21 11:32  Glucose-Capillary 70 - 99 mg/dL 135 (H) 115 (H) 102 (H) 91 74 66 (L)  (H): Data is abnormally high (L): Data is abnormally low  Diabetes history: DM2 Outpatient Diabetes medications: Jardiance 10 mg QD Current orders for Inpatient glycemic control: Novolog 0-15 units TID  Inpatient Diabetes Program Recommendations:    Novolog 0-9 units TID.  Will continue to follow while inpatient.  Thank you, Reche Dixon, MSN, [place] Diabetes Coordinator Inpatient Diabetes Program [phone] (team pager from 8a-5p)

## 2021-09-22 NOTE — Progress Notes (Addendum)
[facility] [place]  Subjective  - Pain is well controlled, passed flatus   Objective (!) 102/44 77 98.1 F (36.7 C) (Oral) 11 97%  Intake/Output Summary (Last 24 hours) at 09/22/2021 8546 Last data filed at 09/22/2021 0600 Gross per 24 hour  Intake 3671.28 ml  Output 575 ml  Net 3096.28 ml   General no acute distress, Palpable pedal pulses Abdomin soft, incisions healing well, groins soft  Alert and oriented x 3    Assessment/Planning:  71 y.o. female is s/p ABF with B CFA endarterectomies  LE well perfused with palpable pulses.  Weaning off Neo.  Hypotension improving maintaining systolic 27'O to 350 with MAP goals > 50. Urine OP improved last 24 hour OP 450 cc.  Cr WNL Will discuss D/C epidural, start clears and PO pain medication. OOB to chair yesterday increase activity as tolerates. HGB 9.3 down from 11.4 09/21/21 Neo @ 40 mcg/min plan to wean as BP tolerates  Roxy Horseman 09/22/2021 7:22 AM --  Laboratory Lab Results: Recent Labs    09/21/21 0612 09/22/21 0404  WBC 10.3 10.3  HGB 11.4* 9.3*  HCT 34.2* 29.9*  PLT 86* 118*   BMET Recent Labs    09/21/21 0757 09/22/21 0404  NA 138 140  K 4.4 4.0  CL 109 113*  CO2 22 22  GLUCOSE 125* 84  BUN 22 16  CREATININE 0.93 0.86  CALCIUM 7.6* 7.9*    COAG Lab Results  Component Value Date   INR 1.3 (H) 09/22/2021   INR 1.3 (H) 09/20/2021   INR 1.1 09/18/2021   No results found for: "PTT"  VASCULAR STAFF ADDENDUM: I have independently interviewed and examined the patient. I agree with the above.  Continues to do well. Weakly palpable pedal pulses. Incisions clean and dry. Marginal urine output but renal function remains normal. OK to remove epidural tomorrow. Hold heparin at 10:00PM. OK for clear liquid diet.  Yevonne Aline. Stanford Breed, MD [facility] Phone Number: [phone] 09/22/2021 11:58 AM

## 2021-09-22 NOTE — Progress Notes (Signed)
NAME:  Teresa [name], MRN:  [id number], DOB:  09/08/1950, LOS: 2 ADMISSION DATE:  09/20/2021, CONSULTATION DATE:  7/19 REFERRING MD:  Stanford Breed REASON FOR CONSULT:  hypotension, blood loss   History of Present Illness:   Teresa [name], is a 71 y.o. female, who presented to [facility] for a planned aortobifemoral bypass graft with Dr. Stanford Breed.  They have a pertinent past medical history of atherosclerosis, AAA, coronary artery disease status post stents, lung cancer, COPD, GERD, ischemic cardiomyopathy, peripheral vascular disease, TAVR  The patient underwent an infrarenal abdominal aortic endarterectomy, aorto-bifemoral bypass, bilateral common femoral endarterectomy.  The procedure had blood loss of 2590ml.  2 units PRBCs, 1 unit FFP, 1 unit of cryo was given, 650 of blood returned via Cell Saver.  Of note renal vein needed dividing during the procedure which resulted in hematuria.  Patient also received 2 L IVF, 1 L albumin.  PCCM was consulted for assistance with blood loss and hypotension.  Patient transferred to ICU postprocedure.  Pertinent  Medical History  AAA, coronary artery disease status post stents, lung cancer, COPD, GERD, ischemic cardiomyopathy, peripheral vascular disease, TAVR  Significant Hospital Events: Including procedures, antibiotic start and stop dates in addition to other pertinent events   7/19 presented to [facility] for planned aortobifemoral bypass graft, PCCM consulted, tenets PRBCs, 1 unit FFP, 1 unit of cryo, 2.5 L blood loss  Interim History / Subjective:   Remains critically ill, does do significantly. Hematuria has subsided. Complains of Pain with coughing Urine output improved for 50 cc last 12 hours   Objective   Blood pressure (!) 102/44, pulse 77, temperature (!) 97.3 F (36.3 C), temperature source Oral, resp. rate 11, height 4\' 11"  (1.499 m), weight 58.1 kg, last menstrual period 10/04/1999, SpO2 97 %.        Intake/Output Summary  (Last 24 hours) at 09/22/2021 0745 Last data filed at 09/22/2021 0600 Gross per 24 hour  Intake 3671.28 ml  Output 575 ml  Net 3096.28 ml    Filed Weights   09/20/21 0644  Weight: 58.1 kg    Examination:  General: Elderly female resting comfortably in bed  HEENT: [place]/AT, PERRL, no JVD Neuro: Alert, oriented, non-focal, no appreciable JVD CV: S1-S2 regular, no murmur PULM: No accessory muscle use, no rhonchi GI: Soft, non-tender, non-distended. Surgical dressing in place, clean, dry, intact.  GU:  Foley in place draining clear dark urine.  Extremities: no edema +1 pedal pulses bilateral. Skin: Midline abdominal incision closed with Surgicel, bilateral groin sites closed with Surgicel, C/D/I, no rashes or lesions noted  Labs/Imaging: Labs show mild hypophosphatemia, hemoglobin continues to drop from 11-9, no leukocytosis, platelets improved   Echo 7/20> LVEF 55-60%. Regional wall motion abnormalities identified. 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 Grade 1 DD. Mild MR. TAVR valve present. IVC suggesting low RA pressure.  Resolved Hospital Problem list     Assessment & Plan:  Shock, multifactorial, suspect hypovolemic and medication induced.  ABLA secondary to aorto-bifemoral bypass Thrombocytopenia secondary to consumption r/t surgery and hematuria.  The procedure had blood loss of 2552mL.  2 units PRBCs, 1 unit FFP, 1 unit of cryo was given, 650 of blood returned via Cell Saver. Patient also received 2 L IVF, 1 L albumin.   -Goal SBP 90 & above.  Continue phenylephrine peripherally.  -Transfuse PRBC if HBG less than 7 -LR at 75 -Trend CBC , goal hemoglobin 8 and above  POD 1 s/p infrarenal abdominal aortic endarterectomy, aorto-bifemoral  bypass, bilateral common femoral endarterectomy with Dr. Stanford Breed -Management per VVS -Epidural catheter can be discontinued tomorrow. -PRN dilaudid  Hematuria : Due to venous congestion, ligation of left renal vein -Consider DC foley -Urology seen ,  recommended routine foley flushes. -Trend UOP  COPD without evidence of acute exacerbation.  Hypoxia: acutely. Suspect secondary to atelectasis post op.  HX of SCC s/p RUL lobectomy in 2014 with Dr. Cyndia Bent On trelegy at home - Incruse and Memory Dance in place of home Trellegy - Incentive spirometry  HX CAD s/p stents PAD HTN HLD HX TAVR AAA Ischemic cardiomyopathy Echo 7/20> LVEF 55-60%. Regional wall motion abnormalities identified. 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 Grade 1 DD. Mild MR. TAVR valve present. IVC suggesting low RA pressure. -Hold home entresto, spironolactone, empagliflozin, bisoprolol, for now.   -Resume home crestor -Plavix per vascular surgery -Continuous telemetry  GERD -PPI   Best Practice (right click and "Reselect all SmartList Selections" daily)   Diet/type: NPO w/ oral meds DVT prophylaxis: SCD GI prophylaxis: PPI Lines: N/A Foley:  Yes, and it is still needed Code Status:  full code Last date of multidisciplinary goals of care discussion [pending]   Critical care time: 31 minutes    Kara Mead MD. FCCP. [facility] Pager : [phone]  If no response to pager , please call [phone] until 7 pm After 7:00 pm call Elink  [phone]     09/22/2021 7:45 AM     ,

## 2021-09-23 ENCOUNTER — Inpatient Hospital Stay (HOSPITAL_COMMUNITY): Payer: PPO | Admitting: Anesthesiology

## 2021-09-23 DIAGNOSIS — D62 Acute posthemorrhagic anemia: Secondary | ICD-10-CM | POA: Diagnosis not present

## 2021-09-23 DIAGNOSIS — R579 Shock, unspecified: Secondary | ICD-10-CM | POA: Diagnosis not present

## 2021-09-23 LAB — CBC WITH DIFFERENTIAL/PLATELET
Abs Immature Granulocytes: 0.05 10*3/uL (ref 0.00–0.07)
Basophils Absolute: 0 10*3/uL (ref 0.0–0.1)
Basophils Relative: 0 %
Eosinophils Absolute: 0.2 10*3/uL (ref 0.0–0.5)
Eosinophils Relative: 3 %
HCT: 28.8 % — ABNORMAL LOW (ref 36.0–46.0)
Hemoglobin: 9.5 g/dL — ABNORMAL LOW (ref 12.0–15.0)
Immature Granulocytes: 1 %
Lymphocytes Relative: 19 %
Lymphs Abs: 1.3 10*3/uL (ref 0.7–4.0)
MCH: 31.3 pg (ref 26.0–34.0)
MCHC: 33 g/dL (ref 30.0–36.0)
MCV: 94.7 fL (ref 80.0–100.0)
Monocytes Absolute: 0.5 10*3/uL (ref 0.1–1.0)
Monocytes Relative: 6 %
Neutro Abs: 5.1 10*3/uL (ref 1.7–7.7)
Neutrophils Relative %: 71 %
Platelets: 92 10*3/uL — ABNORMAL LOW (ref 150–400)
RBC: 3.04 MIL/uL — ABNORMAL LOW (ref 3.87–5.11)
RDW: 14.5 % (ref 11.5–15.5)
WBC: 7.2 10*3/uL (ref 4.0–10.5)
nRBC: 0 % (ref 0.0–0.2)

## 2021-09-23 LAB — BASIC METABOLIC PANEL
Anion gap: 4 — ABNORMAL LOW (ref 5–15)
BUN: 13 mg/dL (ref 8–23)
CO2: 22 mmol/L (ref 22–32)
Calcium: 7.4 mg/dL — ABNORMAL LOW (ref 8.9–10.3)
Chloride: 108 mmol/L (ref 98–111)
Creatinine, Ser: 0.66 mg/dL (ref 0.44–1.00)
GFR, Estimated: >60 mL/min (ref >60)
Glucose, Bld: 84 mg/dL (ref 70–99)
Potassium: 3.4 mmol/L — ABNORMAL LOW (ref 3.5–5.1)
Sodium: 134 mmol/L — ABNORMAL LOW (ref 135–145)

## 2021-09-23 LAB — GLUCOSE, CAPILLARY
Glucose-Capillary: 121 mg/dL — ABNORMAL HIGH (ref 70–99)
Glucose-Capillary: 96 mg/dL (ref 70–99)
Glucose-Capillary: 129 mg/dL — ABNORMAL HIGH (ref 70–99)
Glucose-Capillary: 102 mg/dL — ABNORMAL HIGH (ref 70–99)

## 2021-09-23 LAB — MAGNESIUM: Magnesium: 2.1 mg/dL (ref 1.7–2.4)

## 2021-09-23 LAB — PHOSPHORUS: Phosphorus: 1.9 mg/dL — ABNORMAL LOW (ref 2.5–4.6)

## 2021-09-23 MED ORDER — POTASSIUM PHOSPHATES 15 MMOLE/5ML IV SOLN
30.0000 mmol | Freq: Once | INTRAVENOUS | Status: AC
  Administered 2021-09-23: 30 mmol via INTRAVENOUS
  Filled 2021-09-23: qty 10

## 2021-09-23 MED ORDER — MIDODRINE HCL 5 MG PO TABS
5.0000 mg | ORAL_TABLET | Freq: Three times a day (TID) | ORAL | Status: DC
Start: 2021-09-23 — End: 2021-09-27
  Administered 2021-09-23 – 2021-09-27 (×12): 5 mg via ORAL
  Filled 2021-09-23 (×12): qty 1

## 2021-09-23 MED ORDER — POTASSIUM CHLORIDE CRYS ER 20 MEQ PO TBCR
40.0000 meq | EXTENDED_RELEASE_TABLET | Freq: Once | ORAL | Status: DC
Start: 2021-09-23 — End: 2021-09-23

## 2021-09-23 MED ORDER — HEPARIN SODIUM (PORCINE) 5000 UNIT/ML IJ SOLN
5000.0000 [IU] | Freq: Three times a day (TID) | INTRAMUSCULAR | Status: DC
  Administered 2021-09-24 – 2021-09-27 (×9): 5000 [IU] via SUBCUTANEOUS
  Filled 2021-09-23 (×8): qty 1

## 2021-09-23 MED ORDER — HEPARIN SODIUM (PORCINE) 5000 UNIT/ML IJ SOLN
5000.0000 [IU] | Freq: Three times a day (TID) | INTRAMUSCULAR | Status: DC
Start: 2021-09-23 — End: 2021-09-23

## 2021-09-23 MED ORDER — HEPARIN SODIUM (PORCINE) 5000 UNIT/ML IJ SOLN
5000.0000 [IU] | Freq: Once | INTRAMUSCULAR | Status: AC
  Administered 2021-09-23: 5000 [IU] via SUBCUTANEOUS
  Filled 2021-09-23: qty 1

## 2021-09-23 MED ORDER — K PHOS MONO-SOD PHOS DI & MONO 155-852-130 MG PO TABS
250.0000 mg | ORAL_TABLET | Freq: Three times a day (TID) | ORAL | Status: DC
  Filled 2021-09-23: qty 1

## 2021-09-23 NOTE — Addendum Note (Signed)
Addendum  created 09/23/21 0910 by Effie Berkshire, MD   Order list changed, Pharmacy for encounter modified

## 2021-09-23 NOTE — Anesthesia Post-op Follow-up Note (Addendum)
  Anesthesia Pain Follow-up Note  Patient: Teresa [name]  Day #: 3  Date of Follow-up: 09/23/2021 Time: 9:16 AM  Last Vitals:  Vitals:   09/23/21 0630 09/23/21 0832  BP:    Pulse: 65   Resp: 11   Temp:  36.9 C  SpO2: 98%     Level of Consciousness: alert  Pain: none   Side Effects:None  Catheter Site Exam:clean, dry, no drainage  Anti-Coag Meds (From admission, onward)   Start     Dose/Rate Route Frequency Ordered Stop   09/23/21 2200  heparin injection 5,000 Units        5,000 Units Subcutaneous Every 8 hours 09/23/21 0737      Epidural / Intrathecal (From admission, onward)   Start     Dose/Rate Route Frequency Ordered Stop   09/20/21 1945  ropivacaine (PF) 2 mg/mL (0.2%) (NAROPIN) injection        10 mL/hr 10 mL/hr  Epidural Continuous 09/20/21 1852         Plan: Continue current therapy of postop epidural at surgeon's request  Effie Berkshire

## 2021-09-23 NOTE — Progress Notes (Signed)
NAME:  Teresa [name], MRN:  [id number], DOB:  11-22-1950, LOS: 3 ADMISSION DATE:  09/20/2021, CONSULTATION DATE:  7/19 REFERRING MD:  Stanford Breed REASON FOR CONSULT:  hypotension, blood loss   History of Present Illness:   Teresa [name], is a 70 y.o. female, who presented to [facility] for a planned aortobifemoral bypass graft with Dr. Stanford Breed.  They have a pertinent past medical history of atherosclerosis, AAA, coronary artery disease status post stents, lung cancer, COPD, GERD, ischemic cardiomyopathy, peripheral vascular disease, TAVR  The patient underwent an infrarenal abdominal aortic endarterectomy, aorto-bifemoral bypass, bilateral common femoral endarterectomy.  The procedure had blood loss of 2534ml.  2 units PRBCs, 1 unit FFP, 1 unit of cryo was given, 650 of blood returned via Cell Saver.  Of note renal vein needed dividing during the procedure which resulted in hematuria.  Patient also received 2 L IVF, 1 L albumin.  PCCM was consulted for assistance with blood loss and hypotension.  Patient transferred to ICU postprocedure.  Pertinent  Medical History  AAA, coronary artery disease status post stents, lung cancer, COPD, GERD, ischemic cardiomyopathy, peripheral vascular disease, TAVR  Significant Hospital Events: Including procedures, antibiotic start and stop dates in addition to other pertinent events   7/19 presented to [facility] for planned aortobifemoral bypass graft, PCCM consulted, tenets PRBCs, 1 unit FFP, 1 unit of cryo, 2.5 L blood loss  Interim History / Subjective:   Improving critically ill. Remains on low-dose Neo-Synephrine 50 mics Low urine output Complaints of cough, pain is much improved   Objective   Blood pressure (!) 98/47, pulse 65, temperature 98.5 F (36.9 C), temperature source Oral, resp. rate 11, height 4\' 11"  (1.499 m), weight 58.1 kg, last menstrual period 10/04/1999, SpO2 98 %.        Intake/Output Summary (Last 24 hours) at  09/23/2021 0833 Last data filed at 09/23/2021 0600 Gross per 24 hour  Intake 2498.78 ml  Output 575 ml  Net 1923.78 ml    Filed Weights   09/20/21 0644  Weight: 58.1 kg    Examination:  General: Elderly female resting comfortably in bed  HEENT: [place]/AT, PERRL, no JVD Neuro: Alert, interactive, nonfocal, CV: S1-S2 regular, no murmur PULM: No accessory muscle use, no rhonchi GI: Soft, non-tender, non-distended. Surgical dressing in place, clean, dry, intact.  GU:  Foley in place draining clear dark urine.  Extremities: no edema +1 pedal pulses bilateral. Skin: Midline abdominal incision closed with Surgicel, bilateral groin sites closed with Surgicel, C/D/I  Labs/Imaging: Labs show mild Hypokalemia andhypophosphatemia, hemoglobin dropped from 11 but stabilized at 9.5, mild thrombocytopenia  Echo 7/20> LVEF 55-60%. Regional wall motion abnormalities identified. 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 Grade 1 DD. Mild MR. TAVR valve present. IVC suggesting low RA pressure.  Resolved Hospital Problem list     Assessment & Plan:  Shock, multifactorial, suspect hypovolemic and medication induced.  ABLA secondary to aorto-bifemoral bypass Thrombocytopenia secondary to consumption r/t surgery and hematuria.  -blood loss of 2582mL.  2 units PRBCs, 1 unit FFP, 1 unit of cryo was given, 650 of blood returned via Cell Saver. Patient also received 2 L IVF, 1 L albumin.   -Goal SBP 90 & above.  Continue phenylephrine peripherally.  -Midodrine added 5 3 times daily -We will DC IV fluids now, she is 9 L positive since -Trend CBC , goal hemoglobin 8 and above  POD 1 s/p infrarenal abdominal aortic endarterectomy, aorto-bifemoral bypass, bilateral common femoral endarterectomy with Dr. Stanford Breed -  Management per VVS -Epidural catheter to be discontinued -PRN dilaudid  Hematuria : Due to venous congestion, ligation of left renal vein -Consider DC foley -Urology seen , recommended routine foley flushes. -Trend  UOP, she needs Lasix once off neo   COPD without evidence of acute exacerbation.  Hypoxia: acutely. Suspect secondary to atelectasis post op.  HX of SCC s/p RUL lobectomy in 2014 with Dr. Cyndia Bent On trelegy at home - Incruse and Memory Dance in place of home Trellegy - Incentive spirometry  HX CAD s/p stents PAD HTN HLD HX TAVR AAA Ischemic cardiomyopathy Echo 7/20> LVEF 55-60%. Regional wall motion abnormalities identified. 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 Grade 1 DD. Mild MR. TAVR valve present. IVC suggesting low RA pressure. -Hold home entresto, spironolactone, empagliflozin, bisoprolol, for now.   -Resume home crestor -Plavix per vascular surgery -telemetry  Remains on low-dose Neo-Synephrine, she is 9 L positive now and will limit more IV fluids, ideally she needs diuresis once off pressors   Best Practice (right click and "Reselect all SmartList Selections" daily)   Diet/type: Regular consistency (see orders) DVT prophylaxis: SCD GI prophylaxis: PPI Lines: N/A Foley:  Yes, and it is still needed Code Status:  full code Last date of multidisciplinary goals of care discussion [pending]   Critical care time: 31 minutes    Kara Mead MD. FCCP. [place] [facility] Pager : [phone]  If no response to pager , please call [phone] until 7 pm After 7:00 pm call Elink  [phone]     09/23/2021 8:33 AM     ,

## 2021-09-23 NOTE — Anesthesia Post-op Follow-up Note (Deleted)
  Anesthesia Pain Follow-up Note  Patient: Teresa [name]  Day #: 3  Date of Follow-up: 09/23/2021 Time: 9:14 AM  Last Vitals:  Vitals:   09/23/21 0630 09/23/21 0832  BP:    Pulse: 65   Resp: 11   Temp:  36.9 C  SpO2: 98%     Level of Consciousness: alert  Pain: none   Side Effects:None  Catheter Site Exam:clean, dry, no drainage  Anti-Coag Meds (From admission, onward)   Start     Dose/Rate Route Frequency Ordered Stop   09/23/21 2200  heparin injection 5,000 Units        5,000 Units Subcutaneous Every 8 hours 09/23/21 0737      Epidural / Intrathecal (From admission, onward)   Start     Dose/Rate Route Frequency Ordered Stop   09/20/21 1945  ropivacaine (PF) 2 mg/mL (0.2%) (NAROPIN) injection        10 mL/hr 10 mL/hr  Epidural Continuous 09/20/21 1852         Plan: Continue current therapy of postop epidural at surgeon's request  Effie Berkshire

## 2021-09-23 NOTE — Plan of Care (Signed)
Patient remains in CVICU overnight. Patient weaned off of Neo gtt for now. Patient remains on epidural analgesia.    Problem: Education: Goal: Ability to describe self-care measures that may prevent or decrease complications (Diabetes Survival Skills Education) will improve Outcome: Progressing Goal: Individualized Educational Video(s) Outcome: Progressing   Problem: Coping: Goal: Ability to adjust to condition or change in health will improve Outcome: Progressing   Problem: Fluid Volume: Goal: Ability to maintain a balanced intake and output will improve Outcome: Progressing   Problem: Health Behavior/Discharge Planning: Goal: Ability to identify and utilize available resources and services will improve Outcome: Progressing Goal: Ability to manage health-related needs will improve Outcome: Progressing   Problem: Metabolic: Goal: Ability to maintain appropriate glucose levels will improve Outcome: Progressing   Problem: Nutritional: Goal: Maintenance of adequate nutrition will improve Outcome: Progressing Goal: Progress toward achieving an optimal weight will improve Outcome: Progressing   Problem: Skin Integrity: Goal: Risk for impaired skin integrity will decrease Outcome: Progressing   Problem: Tissue Perfusion: Goal: Adequacy of tissue perfusion will improve Outcome: Progressing   Problem: Education: Goal: Knowledge of General Education information will improve Description: Including pain rating scale, medication(s)/side effects and non-pharmacologic comfort measures Outcome: Progressing   Problem: Health Behavior/Discharge Planning: Goal: Ability to manage health-related needs will improve Outcome: Progressing   Problem: Clinical Measurements: Goal: Ability to maintain clinical measurements within normal limits will improve Outcome: Progressing Goal: Will remain free from infection Outcome: Progressing Goal: Diagnostic test results will improve Outcome:  Progressing Goal: Respiratory complications will improve Outcome: Progressing Goal: Cardiovascular complication will be avoided Outcome: Progressing   Problem: Activity: Goal: Risk for activity intolerance will decrease Outcome: Progressing   Problem: Nutrition: Goal: Adequate nutrition will be maintained Outcome: Progressing   Problem: Coping: Goal: Level of anxiety will decrease Outcome: Progressing   Problem: Elimination: Goal: Will not experience complications related to bowel motility Outcome: Progressing Goal: Will not experience complications related to urinary retention Outcome: Progressing   Problem: Pain Managment: Goal: General experience of comfort will improve Outcome: Progressing   Problem: Safety: Goal: Ability to remain free from injury will improve Outcome: Progressing   Problem: Skin Integrity: Goal: Risk for impaired skin integrity will decrease Outcome: Progressing

## 2021-09-23 NOTE — Progress Notes (Signed)
[facility] [place]  Subjective  - Pain is well controlled, passed flatus, no complaints, eating clears   Objective (!) 98/47 65 97.9 F (36.6 C) (Oral) 11 98%  Intake/Output Summary (Last 24 hours) at 09/23/2021 0724 Last data filed at 09/23/2021 0600 Gross per 24 hour  Intake 2735.45 ml  Output 575 ml  Net 2160.45 ml    General no acute distress, Palpable pedal pulses Abdomin soft, incisions healing well, groins soft  Alert and oriented x 3    Assessment/Planning: 71 y.o. female is s/p ABF with B CFA endarterectomies  LE well perfused with palpable pulses.  Weaning off Neo.  Hypotension improving maintaining systolic 97'L to 892 with MAP goals > 50. Urine OP improved last 24 hour OP 500+ cc.  Cr WNL Epidural out today. PCA.  OOB to chair yesterday increase activity as tolerates. HGB stable Neo wean as BP tolerates  Broadus John 09/23/2021 7:24 AM --  Laboratory Lab Results: Recent Labs    09/22/21 0404 09/23/21 0346  WBC 10.3 7.2  HGB 9.3* 9.5*  HCT 29.9* 28.8*  PLT 118* 92*    BMET Recent Labs    09/22/21 0404 09/23/21 0346  NA 140 134*  K 4.0 3.4*  CL 113* 108  CO2 22 22  GLUCOSE 84 84  BUN 16 13  CREATININE 0.86 0.66  CALCIUM 7.9* 7.4*     COAG Lab Results  Component Value Date   INR 1.3 (H) 09/22/2021   INR 1.3 (H) 09/20/2021   INR 1.1 09/18/2021   No results found for: "PTT"  VASCULAR STAFF ADDENDUM: I have independently interviewed and examined the patient. I agree with the above.  Continues to do well. Weakly palpable pedal pulses. Incisions clean and dry. Marginal urine output but renal function remains normal. OK to remove epidural tomorrow. Hold heparin at 10:00PM. OK for clear liquid diet.  Yevonne Aline. Stanford Breed, MD [facility] Phone Number: [phone] 09/23/2021 7:24 AM

## 2021-09-24 DIAGNOSIS — R579 Shock, unspecified: Secondary | ICD-10-CM | POA: Diagnosis not present

## 2021-09-24 DIAGNOSIS — D62 Acute posthemorrhagic anemia: Secondary | ICD-10-CM | POA: Diagnosis not present

## 2021-09-24 LAB — CBC WITH DIFFERENTIAL/PLATELET
Abs Immature Granulocytes: 0.02 10*3/uL (ref 0.00–0.07)
Basophils Absolute: 0 10*3/uL (ref 0.0–0.1)
Basophils Relative: 0 %
Eosinophils Absolute: 0.2 10*3/uL (ref 0.0–0.5)
Eosinophils Relative: 3 %
HCT: 29.6 % — ABNORMAL LOW (ref 36.0–46.0)
Hemoglobin: 9.6 g/dL — ABNORMAL LOW (ref 12.0–15.0)
Immature Granulocytes: 0 %
Lymphocytes Relative: 18 %
Lymphs Abs: 1 10*3/uL (ref 0.7–4.0)
MCH: 30.4 pg (ref 26.0–34.0)
MCHC: 32.4 g/dL (ref 30.0–36.0)
MCV: 93.7 fL (ref 80.0–100.0)
Monocytes Absolute: 0.4 10*3/uL (ref 0.1–1.0)
Monocytes Relative: 6 %
Neutro Abs: 4.1 10*3/uL (ref 1.7–7.7)
Neutrophils Relative %: 73 %
Platelets: 103 10*3/uL — ABNORMAL LOW (ref 150–400)
RBC: 3.16 MIL/uL — ABNORMAL LOW (ref 3.87–5.11)
RDW: 14.1 % (ref 11.5–15.5)
WBC: 5.7 10*3/uL (ref 4.0–10.5)
nRBC: 0 % (ref 0.0–0.2)

## 2021-09-24 LAB — BPAM RBC
Blood Product Expiration Date: 202308052359
Blood Product Expiration Date: 202308092359
Blood Product Expiration Date: 202308092359
Blood Product Expiration Date: 202308092359
ISSUE DATE / TIME: 202307191205
ISSUE DATE / TIME: 202307191205
Unit Type and Rh: 6200
Unit Type and Rh: 6200
Unit Type and Rh: 6200
Unit Type and Rh: 6200

## 2021-09-24 LAB — TYPE AND SCREEN
ABO/RH(D): A POS
Antibody Screen: NEGATIVE
Unit division: 0
Unit division: 0
Unit division: 0
Unit division: 0

## 2021-09-24 LAB — PHOSPHORUS: Phosphorus: 2.7 mg/dL (ref 2.5–4.6)

## 2021-09-24 LAB — GLUCOSE, CAPILLARY
Glucose-Capillary: 117 mg/dL — ABNORMAL HIGH (ref 70–99)
Glucose-Capillary: 114 mg/dL — ABNORMAL HIGH (ref 70–99)
Glucose-Capillary: 146 mg/dL — ABNORMAL HIGH (ref 70–99)
Glucose-Capillary: 106 mg/dL — ABNORMAL HIGH (ref 70–99)

## 2021-09-24 LAB — BASIC METABOLIC PANEL
Anion gap: 7 (ref 5–15)
BUN: 11 mg/dL (ref 8–23)
CO2: 24 mmol/L (ref 22–32)
Calcium: 7.6 mg/dL — ABNORMAL LOW (ref 8.9–10.3)
Chloride: 105 mmol/L (ref 98–111)
Creatinine, Ser: 0.66 mg/dL (ref 0.44–1.00)
GFR, Estimated: >60 mL/min (ref >60)
Glucose, Bld: 97 mg/dL (ref 70–99)
Potassium: 4 mmol/L (ref 3.5–5.1)
Sodium: 136 mmol/L (ref 135–145)

## 2021-09-24 LAB — MAGNESIUM: Magnesium: 2 mg/dL (ref 1.7–2.4)

## 2021-09-24 MED ORDER — EMPAGLIFLOZIN 10 MG PO TABS
10.0000 mg | ORAL_TABLET | Freq: Every day | ORAL | Status: DC
Start: 2021-09-24 — End: 2021-09-27
  Administered 2021-09-24 – 2021-09-27 (×4): 10 mg via ORAL
  Filled 2021-09-24 (×4): qty 1

## 2021-09-24 MED ORDER — FUROSEMIDE 10 MG/ML IJ SOLN
40.0000 mg | Freq: Once | INTRAMUSCULAR | Status: AC
  Administered 2021-09-24: 40 mg via INTRAVENOUS
  Filled 2021-09-24: qty 4

## 2021-09-24 MED ORDER — CEFAZOLIN SODIUM-DEXTROSE 1-4 GM/50ML-% IV SOLN: 1.0000 g | Freq: Once | INTRAVENOUS | Status: DC | PRN

## 2021-09-24 MED ORDER — CEFAZOLIN SODIUM-DEXTROSE 1-4 GM/50ML-% IV SOLN
1.0000 g | Freq: Three times a day (TID) | INTRAVENOUS | Status: DC
  Administered 2021-09-24: 1 g via INTRAVENOUS
  Filled 2021-09-24 (×2): qty 50

## 2021-09-24 MED ORDER — ALBUMIN HUMAN 25 % IV SOLN
12.5000 g | Freq: Once | INTRAVENOUS | Status: AC
Start: 2021-09-24 — End: 2021-09-24
  Administered 2021-09-24: 12.5 g via INTRAVENOUS
  Filled 2021-09-24: qty 50

## 2021-09-24 MED ORDER — POTASSIUM CHLORIDE CRYS ER 20 MEQ PO TBCR
20.0000 meq | EXTENDED_RELEASE_TABLET | Freq: Two times a day (BID) | ORAL | Status: DC
  Administered 2021-09-24 – 2021-09-27 (×7): 20 meq via ORAL
  Filled 2021-09-24 (×7): qty 1

## 2021-09-24 NOTE — Progress Notes (Signed)
NAME:  Teresa [name], MRN:  [id number], DOB:  [birth year], LOS: 4 ADMISSION DATE:  09/20/2021, CONSULTATION DATE:  7/19 REFERRING MD:  Stanford Breed REASON FOR CONSULT:  hypotension, blood loss   History of Present Illness:   Teresa [name], is a 71 y.o. female, who presented to [facility] for a planned aortobifemoral bypass graft with Dr. Stanford Breed.  They have a pertinent past medical history of atherosclerosis, AAA, coronary artery disease status post stents, lung cancer, COPD, GERD, ischemic cardiomyopathy, peripheral vascular disease, TAVR  The patient underwent an infrarenal abdominal aortic endarterectomy, aorto-bifemoral bypass, bilateral common femoral endarterectomy.  The procedure had blood loss of 2575ml.  2 units PRBCs, 1 unit FFP, 1 unit of cryo was given, 650 of blood returned via Cell Saver.  Of note renal vein needed dividing during the procedure which resulted in hematuria.  Patient also received 2 L IVF, 1 L albumin.  PCCM was consulted for assistance with blood loss and hypotension.  Patient transferred to ICU postprocedure.  Pertinent  Medical History  AAA, coronary artery disease status post stents, lung cancer, COPD, GERD, ischemic cardiomyopathy, peripheral vascular disease, TAVR  Significant Hospital Events: Including procedures, antibiotic start and stop dates in addition to other pertinent events   7/19 presented to [facility] for planned aortobifemoral bypass graft, PCCM consulted, tenets PRBCs, 1 unit FFP, 1 unit of cryo, 2.5 L blood loss 7/22 added midodrine and Neo-Synephrine off  Interim History / Subjective:  Off pressors. 10 L positive Out of bed to chair Afebrile Weight is up by 15 pounds   Objective   Blood pressure (!) 107/44, pulse 61, temperature 97.6 F (36.4 C), temperature source Oral, resp. rate 12, height 4\' 11"  (1.499 m), weight 73.2 kg, last menstrual period 10/04/1999, SpO2 99 %.        Intake/Output Summary (Last 24 hours) at  09/24/2021 0848 Last data filed at 09/24/2021 0700 Gross per 24 hour  Intake 1420.48 ml  Output 420 ml  Net 1000.48 ml    Filed Weights   09/20/21 0644 09/24/21 0500  Weight: 58.1 kg 73.2 kg    Examination:  General: Elderly female resting comfortably in bed  HEENT: [place]/AT, PERRL, no JVD Neuro: Alert, interactive, nonfocal, CV: S1-S2 regular, no murmur PULM: No accessory muscle use, coarse breath sounds bilateral GI: Soft, non-tender, non-distended. Surgical dressing in place, clean, dry, intact.  GU:  Foley in place draining clear dark urine.  Extremities: no edema +1 pedal pulses bilateral. Skin: Midline abdominal incision closed with Surgicel, bilateral groin sites closed with Surgicel, C/D/I  Labs/Imaging: Labs show normal electrolytes, stable hemoglobin, no leukocytosis  Echo 7/20> LVEF 55-60%. Regional wall motion abnormalities identified. 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 Grade 1 DD. Mild MR. TAVR valve present. IVC suggesting low RA pressure.  Resolved Hospital Problem list   Shock, hemorrhagic Hematuria : Due to venous congestion, ligation of left renal vein  Assessment & Plan:  Volume overload -start Lasix 40 mg daily  ABLA secondary to aorto-bifemoral bypass Thrombocytopenia secondary to consumption r/t surgery and hematuria.  -blood loss of 2553mL.  2 units PRBCs, 1 unit FFP, 1 unit of cryo was given, 650 of blood returned via Cell Saver. Patient also received 2 L IVF, 1 L albumin.    -Continue midodrine until sure that she tolerates diuresis, would not need this medication on discharge -Trend CBC , goal hemoglobin 8 and above  POD 1 s/p infrarenal abdominal aortic endarterectomy, aorto-bifemoral bypass, bilateral common femoral endarterectomy with  Dr. Stanford Breed -Management per VVS -Epidural catheter to be discontinued -PRN dilaudid   COPD without evidence of acute exacerbation.  Hypoxia: acutely. Suspect secondary to atelectasis post op.  HX of SCC s/p RUL lobectomy in  2014 with Dr. Cyndia Bent  - Keenan Bachelor and Memory Dance while in hospital, can resume home Trelegy on discharge - Incentive spirometry  HX CAD s/p stents PAD HTN HLD HX TAVR AAA Ischemic cardiomyopathy Echo 7/20> LVEF 55-60%. Regional wall motion abnormalities identified. 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 Grade 1 DD. Mild MR. TAVR valve present. IVC suggesting low RA pressure. -Hold home entresto, spironolactone, empagliflozin, bisoprolol, for now.   -We will need to restart gradually as blood pressure improves -Resume home crestor -Plavix per vascular surgery -telemetry  Discussed with vascular. She can transfer to telemetry   Best Practice (right click and "Reselect all SmartList Selections" daily)   Diet/type: Regular consistency (see orders) DVT prophylaxis: SCD GI prophylaxis: PPI Lines: N/A Foley:  Yes, and it is still needed Code Status:  full code Last date of multidisciplinary goals of care discussion [pending]      Teresa Mead MD. Shade Flood. [place] [facility] Pager : [phone]  If no response to pager , please call [phone] until 7 pm After 7:00 pm call Elink  [phone]     09/24/2021 8:48 AM     ,

## 2021-09-24 NOTE — Anesthesia Post-op Follow-up Note (Signed)
  Anesthesia Pain Follow-up Note  Patient: Teresa [name]  Day #: 4  Date of Follow-up: 09/24/2021 Time: 9:49 AM  Last Vitals:  Vitals:   09/24/21 0742 09/24/21 0852  BP:    Pulse:    Resp:    Temp: 36.4 C   SpO2:  97%    Level of Consciousness: alert  Pain: mild   Side Effects:None  Catheter Site Exam: Clean, dry, intact, non-indurated.  Anti-Coag Meds (From admission, onward)   Start     Dose/Rate Route Frequency Ordered Stop   09/24/21 1400  heparin injection 5,000 Units        5,000 Units Subcutaneous Every 8 hours 09/23/21 1352      Epidural / Intrathecal (From admission, onward)   Start     Dose/Rate Route Frequency Ordered Stop   09/20/21 1945  ropivacaine (PF) 2 mg/mL (0.2%) (NAROPIN) injection        10 mL/hr 10 mL/hr  Epidural Continuous 09/20/21 1852         Plan: Catheter removed/tip intact at surgeon's request  Audry Pili

## 2021-09-24 NOTE — Progress Notes (Signed)
[facility] [place]  Subjective  - Pain is well controlled, BM over night   Objective (!) 107/44 61 97.6 F (36.4 C) (Oral) 12 99%  Intake/Output Summary (Last 24 hours) at 09/24/2021 0836 Last data filed at 09/24/2021 0700 Gross per 24 hour  Intake 1420.48 ml  Output 420 ml  Net 1000.48 ml    General no acute distress, Palpable pedal pulses Abdomin soft, incisions healing well, groins soft  Alert and oriented x 3 Edematous    Assessment/Planning: 71 y.o. female is s/p ABF with B CFA endarterectomies  LE well perfused with palpable pulses.  Normotensive, Maintaining systolic 49'Z to 791 with MAP goals > 50. Urine OP stable, Net positive several liters Epidural out today. PCA.  OOB to chair yesterday increase activity as tolerates. HGB stable   Floor this afternoon pending epidural removal without issue.  Diuresis daily - gentle Daily labs OOB PT/OT Aggressive pulmonary toilet - Incentive spirometry / flutter valve Regular diet    Broadus John 09/24/2021 8:36 AM --  Laboratory Lab Results: Recent Labs    09/23/21 0346 09/24/21 0021  WBC 7.2 5.7  HGB 9.5* 9.6*  HCT 28.8* 29.6*  PLT 92* 103*    BMET Recent Labs    09/23/21 0346 09/24/21 0021  NA 134* 136  K 3.4* 4.0  CL 108 105  CO2 22 24  GLUCOSE 84 97  BUN 13 11  CREATININE 0.66 0.66  CALCIUM 7.4* 7.6*     COAG Lab Results  Component Value Date   INR 1.3 (H) 09/22/2021   INR 1.3 (H) 09/20/2021   INR 1.1 09/18/2021   No results found for: "PTT"  VASCULAR STAFF ADDENDUM: I have independently interviewed and examined the patient. I agree with the above.  Continues to do well. Weakly palpable pedal pulses. Incisions clean and dry. Marginal urine output but renal function remains normal. OK to remove epidural tomorrow. Hold heparin at 10:00PM. OK for clear liquid diet.  Yevonne Aline. Stanford Breed, MD [facility] Phone  Number: [phone] 09/24/2021 8:36 AM

## 2021-09-24 NOTE — Progress Notes (Signed)
Paged MD to notify regarding elevated HR. MD placed new order for albumin. Will continue to monitor. Teresa [name]

## 2021-09-24 NOTE — Progress Notes (Signed)
Patient arrived to 4E from 2 Heart. Vitals taken and stable but HR in the 120s-low 130s. Patient alert and oriented to room and staff. Tele placed and CCMD notified. Abdominal incision and groin sites assessed and level 0. Call bell within reach. [place] C Aidynn Krenn

## 2021-09-25 LAB — GLUCOSE, CAPILLARY
Glucose-Capillary: 92 mg/dL (ref 70–99)
Glucose-Capillary: 92 mg/dL (ref 70–99)
Glucose-Capillary: 104 mg/dL — ABNORMAL HIGH (ref 70–99)
Glucose-Capillary: 91 mg/dL (ref 70–99)

## 2021-09-25 LAB — BASIC METABOLIC PANEL
Anion gap: 6 (ref 5–15)
BUN: 12 mg/dL (ref 8–23)
CO2: 28 mmol/L (ref 22–32)
Calcium: 8.1 mg/dL — ABNORMAL LOW (ref 8.9–10.3)
Chloride: 105 mmol/L (ref 98–111)
Creatinine, Ser: 0.8 mg/dL (ref 0.44–1.00)
GFR, Estimated: >60 mL/min (ref >60)
Glucose, Bld: 96 mg/dL (ref 70–99)
Potassium: 4.6 mmol/L (ref 3.5–5.1)
Sodium: 139 mmol/L (ref 135–145)

## 2021-09-25 LAB — CBC WITH DIFFERENTIAL/PLATELET
Abs Immature Granulocytes: 0.03 10*3/uL (ref 0.00–0.07)
Basophils Absolute: 0 10*3/uL (ref 0.0–0.1)
Basophils Relative: 1 %
Eosinophils Absolute: 0.2 10*3/uL (ref 0.0–0.5)
Eosinophils Relative: 3 %
HCT: 30.3 % — ABNORMAL LOW (ref 36.0–46.0)
Hemoglobin: 10.1 g/dL — ABNORMAL LOW (ref 12.0–15.0)
Immature Granulocytes: 1 %
Lymphocytes Relative: 20 %
Lymphs Abs: 0.9 10*3/uL (ref 0.7–4.0)
MCH: 30.9 pg (ref 26.0–34.0)
MCHC: 33.3 g/dL (ref 30.0–36.0)
MCV: 92.7 fL (ref 80.0–100.0)
Monocytes Absolute: 0.4 10*3/uL (ref 0.1–1.0)
Monocytes Relative: 9 %
Neutro Abs: 3.3 10*3/uL (ref 1.7–7.7)
Neutrophils Relative %: 66 %
Platelets: 135 10*3/uL — ABNORMAL LOW (ref 150–400)
RBC: 3.27 MIL/uL — ABNORMAL LOW (ref 3.87–5.11)
RDW: 14.4 % (ref 11.5–15.5)
WBC: 4.8 10*3/uL (ref 4.0–10.5)
nRBC: 0 % (ref 0.0–0.2)

## 2021-09-25 LAB — PHOSPHORUS: Phosphorus: 2.5 mg/dL (ref 2.5–4.6)

## 2021-09-25 LAB — MAGNESIUM: Magnesium: 1.8 mg/dL (ref 1.7–2.4)

## 2021-09-25 MED ORDER — FUROSEMIDE 20 MG PO TABS
20.0000 mg | ORAL_TABLET | Freq: Once | ORAL | Status: AC
  Administered 2021-09-25: 20 mg via ORAL
  Filled 2021-09-25: qty 1

## 2021-09-25 MED ORDER — PANTOPRAZOLE SODIUM 40 MG PO TBEC
40.0000 mg | DELAYED_RELEASE_TABLET | Freq: Every day | ORAL | Status: DC
  Administered 2021-09-25 – 2021-09-27 (×3): 40 mg via ORAL
  Filled 2021-09-25 (×3): qty 1

## 2021-09-25 NOTE — Progress Notes (Signed)
Physical Therapy Treatment Patient Details Name: Teresa [name] MRN: [id number] DOB: April 26, 1950 Today's Date: 09/25/2021   History of Present Illness 71 yo female admitted 7/18 for aortobifem BPG which required left renal vein ligation operatively with post op hematuria. PMHx: COPD, CAD, TAVR, CHF, GERD, HLD, ICM, PVD, lung CA s/p Rt lobectomy    PT Comments    Epidural has been removed and pt making excellent progress.  She was able to ambulate 40' but did have increased HR (140's) and O2 sats 88% on RA with activity -required rest breaks and cues to focus on breathing. Continue to advance as able.     Recommendations for follow up therapy are one component of a multi-disciplinary discharge planning process, led by the attending physician.  Recommendations may be updated based on patient status, additional functional criteria and insurance authorization.  Follow Up Recommendations  Home health PT     Assistance Recommended at Discharge    Patient can return home with the following A little help with walking and/or transfers;A little help with bathing/dressing/bathroom;Assistance with cooking/housework;Assist for transportation;Help with stairs or ramp for entrance   Equipment Recommendations  BSC/3in1    Recommendations for Other Services       Precautions / Restrictions Precautions Precautions: Fall Restrictions Weight Bearing Restrictions: No     Mobility  Bed Mobility               General bed mobility comments: up in the recliner    Transfers Overall transfer level: Needs assistance Equipment used: Rolling walker (2 wheels) Transfers: Sit to/from Stand Sit to Stand: Supervision           General transfer comment: Performed 3 x sit to stand - increased time but no physical assist; min cues to scoot to edge of chair    Ambulation/Gait Ambulation/Gait assistance: Min guard Gait Distance (Feet): 75 Feet Assistive device: Rolling walker (2  wheels) Gait Pattern/deviations: Step-through pattern, Decreased stride length       General Gait Details: Slow but steady gait with min cues for posture ; see comments for vitals   Stairs             Wheelchair Mobility    Modified Rankin (Stroke Patients Only)       Balance Overall balance assessment: Needs assistance Sitting-balance support: Feet supported Sitting balance-Leahy Scale: Good     Standing balance support: Bilateral upper extremity supported, No upper extremity supported Standing balance-Leahy Scale: Fair Standing balance comment: RW to ambulate but could static stand without AD                            Cognition Arousal/Alertness: Awake/alert Behavior During Therapy: WFL for tasks assessed/performed Overall Cognitive Status: Within Functional Limits for tasks assessed                                          Exercises Other Exercises Other Exercises: 10 x mini squats with RW    General Comments General comments (skin integrity, edema, etc.): Pt on RA with sats 98% rest but down to 88% with activity.  HR was 105 bpm rest , up to 120's initially with ambulation but increased to 143-148 bpm.  HR returning to baseline within 5 mins of rest.      Pertinent Vitals/Pain Pain Assessment Pain Assessment: 0-10 Pain Score: 3  Pain Location: left groin and abdomen Pain Descriptors / Indicators: Discomfort, Grimacing, Guarding Pain Intervention(s): Monitored during session, Limited activity within patient's tolerance    Home Living                          Prior Function            PT Goals (current goals can now be found in the care plan section) Progress towards PT goals: Progressing toward goals    Frequency    Min 3X/week      PT Plan Current plan remains appropriate    Co-evaluation              AM-PAC PT "6 Clicks" Mobility   Outcome Measure  Help needed turning from your back to  your side while in a flat bed without using bedrails?: A Little Help needed moving from lying on your back to sitting on the side of a flat bed without using bedrails?: A Little Help needed moving to and from a bed to a chair (including a wheelchair)?: A Little Help needed standing up from a chair using your arms (e.g., wheelchair or bedside chair)?: A Little Help needed to walk in hospital room?: A Little Help needed climbing 3-5 steps with a railing? : A Little 6 Click Score: 18    End of Session Equipment Utilized During Treatment: Gait belt Activity Tolerance: Patient tolerated treatment well Patient left: in chair;with call bell/phone within reach Nurse Communication: Mobility status PT Visit Diagnosis: Other abnormalities of gait and mobility (R26.89);Difficulty in walking, not elsewhere classified (R26.2);Muscle weakness (generalized) (M62.81)     Time: 6837-2902 PT Time Calculation (min) (ACUTE ONLY): 24 min  Charges:  $Gait Training: 8-22 mins $Therapeutic Activity: 8-22 mins                     Abran Richard, PT Acute Rehab Services Pager [phone] [facility] [place] 09/25/2021, 3:07 PM

## 2021-09-25 NOTE — TOC Initial Note (Signed)
Transition of Care (TOC) - Initial/Assessment Note  Marvetta Gibbons RN, BSN Transitions of Care Unit 4E- RN Case Manager See Treatment Team for direct phone #    Patient Details  Name: Teresa [name] MRN: [id number] Date of Birth: 1950/07/15  Transition of Care [facility]) CM/SW Contact:    Dawayne Patricia, RN Phone Number: 09/25/2021, 10:59 AM  Clinical Narrative:                 Spoke with pt at bedside to discuss transition of care needs. Per pt she has a RW available at home, might have [facility]- will have her husband check (DME was her husband's after knee surgery).  Bedside RN working on weaning 02- CM will follow for potential home 02 needs- this was also discussed with pt- who voiced understanding.   TOC had received notification from [facility] regarding vascular office referral for [facility] needs- Discussed this with pt, pt voiced she may have had a phone call from [place] but not sure. Pt is agreeable to using [facility] for her HH needs. Order has been placed for HHPT at this time.  TOC will continue to follow for any additional needs prior to discharge.   Expected Discharge Plan: [place] Barriers to Discharge: Continued Medical Work up   Patient Goals and CMS Choice Patient states their goals for this hospitalization and ongoing recovery are:: return home CMS Medicare.gov Compare Post Acute Care list provided to:: Patient Choice offered to / list presented to : Patient  Expected Discharge Plan and Services Expected Discharge Plan: [place]   Discharge Planning Services: CM Consult Post Acute Care Choice: [place] arrangements for the past 2 months: Single Family Home                           HH Arranged: PT [place]: [place] Date [place]: 09/25/21 Time [place] Agency Contacted: 1006 Representative spoke with at [place]: Lattie Haw  Prior Living Arrangements/Services Living arrangements for the past 2  months: [place] Lives with:: Spouse Patient language and need for interpreter reviewed:: Yes        Need for Family Participation in Patient Care: Yes (Comment) Care giver support system in place?: Yes (comment) Current home services: DME (RW) Criminal Activity/Legal Involvement Pertinent to Current Situation/Hospitalization: No - Comment as needed  Activities of Daily Living      Permission Sought/Granted   Permission granted to share information with : Yes, Verbal Permission Granted     Permission granted to share info w AGENCY: HH        Emotional Assessment Appearance:: Appears stated age Attitude/Demeanor/Rapport: Engaged Affect (typically observed): Accepting, Appropriate Orientation: : Oriented to Self, Oriented to Place, Oriented to  Time, Oriented to Situation Alcohol / Substance Use: Not Applicable Psych Involvement: No (comment)  Admission diagnosis:  Aortoiliac occlusive disease ([place]) [I74.09] Patient Active Problem List   Diagnosis Date Noted   Aortoiliac occlusive disease ([place]) 09/20/2021   Shock ([place])    Thrombocytopenia ([place])    ABLA (acute blood loss anemia)    Hematuria    Mitral regurgitation    S/P TAVR (transcatheter aortic valve replacement)    Acute on chronic combined systolic and diastolic heart failure ([place]) 08/19/2018   Severe aortic stenosis    Chronic respiratory failure with hypoxia ([place]) 02/14/2018   Abnormal finding on lung imaging 01/09/2018   Obesity 07/28/2013  Lung cancer, upper lobe ([place]) 03/31/2013   Tachycardia, unspecified    Lung mass 02/27/2013   Mixed hyperlipidemia 12/26/2012   Coronary atherosclerosis of native coronary artery 12/26/2012   CAD (coronary artery disease), native coronary artery    Peripheral vascular disease, unspecified ([place])    Nonspecific abnormal unspecified cardiovascular function study    Solitary pulmonary nodule 06/29/2012   COPD GOLD III  11/07/2011   Smoker 11/07/2011   Multiple  pulmonary nodules 08/25/2011   PCP:  Leeroy Cha, MD Pharmacy:   [facility] ORDER) [place], [place] [address] Phone: [phone] Fax: [phone]  [id number] [place], [place] [address] [place] [address] Phone: [phone] Fax: [phone]  [id number] [place], [place] [address] [place] [address] Phone: [phone] Fax: [phone]     Social Determinants of Health (SDOH) Interventions    Readmission Risk Interventions     No data to display

## 2021-09-25 NOTE — Progress Notes (Addendum)
  Progress Note    09/25/2021 7:34 AM 5 Days Post-Op  Subjective:  BM again yesterday; sensation improved in feet since epidural removed yesterday   Vitals:   09/25/21 0002 09/25/21 0446  BP: 122/62 131/67  Pulse: 78 81  Resp: 14 10  Temp: 97.6 F (36.4 C) 97.6 F (36.4 C)  SpO2: 99% 99%   Physical Exam Lungs:  non labored Incisions:  abd and groin incisions c/d/i Extremities:  palpable DP pulses Abdomen:  soft Neurologic: a&O  CBC    Component Value Date/Time   WBC 4.8 09/25/2021 0500   RBC 3.27 (L) 09/25/2021 0500   HGB 10.1 (L) 09/25/2021 0500   HGB 12.6 01/04/2020 1353   HGB 13.9 01/02/2017 0737   HCT 30.3 (L) 09/25/2021 0500   HCT 42.0 01/02/2017 0737   PLT 135 (L) 09/25/2021 0500   PLT 297 01/04/2020 1353   PLT 264 01/02/2017 0737   MCV 92.7 09/25/2021 0500   MCV 89.2 01/02/2017 0737   MCH 30.9 09/25/2021 0500   MCHC 33.3 09/25/2021 0500   RDW 14.4 09/25/2021 0500   RDW 13.8 01/02/2017 0737   LYMPHSABS 0.9 09/25/2021 0500   LYMPHSABS 1.9 01/02/2017 0737   MONOABS 0.4 09/25/2021 0500   MONOABS 0.5 01/02/2017 0737   EOSABS 0.2 09/25/2021 0500   EOSABS 0.2 01/02/2017 0737   BASOSABS 0.0 09/25/2021 0500   BASOSABS 0.1 01/02/2017 0737    BMET    Component Value Date/Time   NA 139 09/25/2021 0500   NA 136 09/03/2018 1122   NA 140 01/02/2017 0737   K 4.6 09/25/2021 0500   K 4.4 01/02/2017 0737   CL 105 09/25/2021 0500   CO2 28 09/25/2021 0500   CO2 29 01/02/2017 0737   GLUCOSE 96 09/25/2021 0500   GLUCOSE 81 01/02/2017 0737   BUN 12 09/25/2021 0500   BUN 16 09/03/2018 1122   BUN 17.2 01/02/2017 0737   CREATININE 0.80 09/25/2021 0500   CREATININE 0.91 01/04/2020 1353   CREATININE 0.7 01/02/2017 0737   CALCIUM 8.1 (L) 09/25/2021 0500   CALCIUM 9.7 01/02/2017 0737   GFRNONAA >60 09/25/2021 0500   GFRNONAA >60 01/04/2020 1353   GFRAA >60 04/30/2019 1030   GFRAA >60 01/05/2019 0950    INR    Component Value Date/Time   INR 1.3 (H)  09/22/2021 0404     Intake/Output Summary (Last 24 hours) at 09/25/2021 0734 Last data filed at 09/24/2021 1823 Gross per 24 hour  Intake 229.94 ml  Output 1625 ml  Net -1395.06 ml     Assessment/Plan:  71 y.o. female is s/p ABF bypass with B  CFA endarterectomies 5 Days Post-Op   BLE well perfused with palpable DP pulses Abd and groin incisions appear to be healing well OOB with therapy today; TOC consulted for [facility] PT GI: another BM yesterday; advance diet today Renal: d/c foley Home when mobility improved and tolerating a diet    Dagoberto Ligas, PA-C [facility] [phone] 09/25/2021 7:34 AM   VASCULAR STAFF ADDENDUM: I have independently interviewed and examined the patient. I agree with the above.  Progressing nicely after ABF. Palpable pedal pulses Incisions healing appropriately. Needs to mobilize. Start regular diet today. Hopefully home in the next 48 hours.  Yevonne Aline. Stanford Breed, MD [facility] Phone Number: [phone] 09/25/2021 10:50 AM

## 2021-09-25 NOTE — Progress Notes (Signed)
Pt's HR=130s and sustained. Pt asymptomatic. 2 mg metoprolol administered.   DC foley per order. Pt tolerated well.   Lavenia Atlas, RN

## 2021-09-25 NOTE — Progress Notes (Signed)
PCCM Progress Note   Patient seen and examined with chart reviewed, no further critical care need identified. PCCM will sign off. Thank you for the opportunity to participate in this patient's care. Please contact if we can be of further assistance.   Mellany Dinsmore D. Kenton Kingfisher, NP-C [facility] Personal contact information can be found on Amion  09/25/2021, 9:10 AM

## 2021-09-25 NOTE — Progress Notes (Signed)
Occupational Therapy Treatment Patient Details Name: Teresa [name] MRN: [id number] DOB: 10-07-1950 Today's Date: 09/25/2021   History of present illness 71 yo female admitted 7/18 for aortobifem BPG which required left renal vein ligation operatively with post op hematuria. PMHx: COPD, CAD, TAVR, CHF, GERD, HLD, ICM, PVD, lung CA s/p Rt lobectomy   OT comments  Pt progressing. Pt increasing ability for OOB activity tolerance standing at sink, performing pericare and ambulating in room. Pt education provided for energy conservation. Pt on RA. HR 108-127 BPM with exertion. Pt would benefit from continued OT skilled services. OT following acutely.   Recommendations for follow up therapy are one component of a multi-disciplinary discharge planning process, led by the attending physician.  Recommendations may be updated based on patient status, additional functional criteria and insurance authorization.    Follow Up Recommendations  Follow physician's recommendations for discharge plan and follow up therapies    Assistance Recommended at Discharge Intermittent Supervision/Assistance  Patient can return home with the following  A little help with walking and/or transfers;A little help with bathing/dressing/bathroom;Assistance with cooking/housework;Assist for transportation;Help with stairs or ramp for entrance   Equipment Recommendations  Tub/shower seat    Recommendations for Other Services      Precautions / Restrictions Precautions Precautions: Fall Restrictions Weight Bearing Restrictions: No       Mobility Bed Mobility Overal bed mobility: Needs Assistance Bed Mobility: Rolling, Sidelying to Sit Rolling: Supervision Sidelying to sit: Supervision            Transfers Overall transfer level: Needs assistance Equipment used: Rolling walker (2 wheels) Transfers: Sit to/from Stand Sit to Stand: Supervision                 Balance Overall balance assessment:  Needs assistance Sitting-balance support: Feet supported Sitting balance-Leahy Scale: Fair     Standing balance support: Single extremity supported, During functional activity, Reliant on assistive device for balance Standing balance-Leahy Scale: Poor                             ADL either performed or assessed with clinical judgement   ADL Overall ADL's : Needs assistance/impaired     Grooming: Supervision/safety;Standing;Wash/dry hands;Wash/dry face;Oral care;Brushing hair Grooming Details (indicate cue type and reason): standing x4 mins             Lower Body Dressing: Min guard;Sitting/lateral leans;Sit to/from stand Lower Body Dressing Details (indicate cue type and reason): figure 4 Toilet Transfer: Supervision/safety;Ambulation;Rolling walker (2 wheels)   Toileting- Clothing Manipulation and Hygiene: Supervision/safety;Sit to/from stand       Functional mobility during ADLs: Supervision/safety;Rolling walker (2 wheels) General ADL Comments: Pt increasing ability for OOB activity tolerance standing at sink, performing pericare and ambulating in room. Pt education provided for energy conservation.    Extremity/Trunk Assessment Upper Extremity Assessment Upper Extremity Assessment: Overall WFL for tasks assessed   Lower Extremity Assessment Lower Extremity Assessment: Generalized weakness;Defer to PT evaluation        Vision   Vision Assessment?: No apparent visual deficits   Perception     Praxis      Cognition Arousal/Alertness: Awake/alert Behavior During Therapy: WFL for tasks assessed/performed Overall Cognitive Status: Within Functional Limits for tasks assessed  Exercises      Shoulder Instructions       General Comments Pt on RA. HR 108-127 BPM with exertion.    Pertinent Vitals/ Pain       Pain Assessment Pain Assessment: 0-10 Pain Score: 3  Pain Location: left groin  and abdomen Pain Descriptors / Indicators: Discomfort, Grimacing, Guarding Pain Intervention(s): Monitored during session, Premedicated before session, Repositioned  Home Living                                          Prior Functioning/Environment              Frequency  Min 2X/week        Progress Toward Goals  OT Goals(current goals can now be found in the care plan section)  Progress towards OT goals: Progressing toward goals  Acute Rehab OT Goals Patient Stated Goal: to go home OT Goal Formulation: With patient Time For Goal Achievement: 10/05/21 Potential to Achieve Goals: Good  Plan Discharge plan needs to be updated    Co-evaluation                 AM-PAC OT "6 Clicks" Daily Activity     Outcome Measure   Help from another person eating meals?: None Help from another person taking care of personal grooming?: None Help from another person toileting, which includes using toliet, bedpan, or urinal?: A Little Help from another person bathing (including washing, rinsing, drying)?: A Little Help from another person to put on and taking off regular upper body clothing?: None Help from another person to put on and taking off regular lower body clothing?: A Little 6 Click Score: 21    End of Session Equipment Utilized During Treatment: Rolling walker (2 wheels)  OT Visit Diagnosis: Unsteadiness on feet (R26.81)   Activity Tolerance Patient tolerated treatment well   Patient Left in chair;with call bell/phone within reach   Nurse Communication Mobility status        Time: 7867-5449 OT Time Calculation (min): 32 min  Charges: OT General Charges $OT Visit: 1 Visit OT Treatments $Self Care/Home Management : 8-22 mins $Therapeutic Activity: 8-22 mins  Jefferey Pica, OTR/L Acute Rehabilitation Services Office: [phone]   Jaelin Devincentis C 09/25/2021, 2:58 PM

## 2021-09-26 LAB — GLUCOSE, CAPILLARY
Glucose-Capillary: 75 mg/dL (ref 70–99)
Glucose-Capillary: 67 mg/dL — ABNORMAL LOW (ref 70–99)
Glucose-Capillary: 140 mg/dL — ABNORMAL HIGH (ref 70–99)
Glucose-Capillary: 101 mg/dL — ABNORMAL HIGH (ref 70–99)
Glucose-Capillary: 87 mg/dL (ref 70–99)

## 2021-09-26 MED ORDER — CLOPIDOGREL BISULFATE 75 MG PO TABS
75.0000 mg | ORAL_TABLET | Freq: Every day | ORAL | Status: DC
Start: 2021-09-26 — End: 2021-09-27
  Administered 2021-09-26 – 2021-09-27 (×2): 75 mg via ORAL
  Filled 2021-09-26 (×2): qty 1

## 2021-09-26 NOTE — Progress Notes (Addendum)
  Progress Note    09/26/2021 7:46 AM 6 Days Post-Op  Subjective:  Pleased with her mobility during therapy yesterday.  Had some heartburn yesterday but otherwise tolerated a regular diet   Vitals:   09/26/21 0434 09/26/21 0744  BP: (!) 129/58   Pulse: 86   Resp: 20   Temp: 98.5 F (36.9 C)   SpO2: 100% 100%   Physical Exam: Lungs:  non labored Incisions:  abd and groin incisions c/d/i Extremities:  palpable DP pulses Abdomen:  soft, NT, ND Neurologic: A&O  CBC    Component Value Date/Time   WBC 4.8 09/25/2021 0500   RBC 3.27 (L) 09/25/2021 0500   HGB 10.1 (L) 09/25/2021 0500   HGB 12.6 01/04/2020 1353   HGB 13.9 01/02/2017 0737   HCT 30.3 (L) 09/25/2021 0500   HCT 42.0 01/02/2017 0737   PLT 135 (L) 09/25/2021 0500   PLT 297 01/04/2020 1353   PLT 264 01/02/2017 0737   MCV 92.7 09/25/2021 0500   MCV 89.2 01/02/2017 0737   MCH 30.9 09/25/2021 0500   MCHC 33.3 09/25/2021 0500   RDW 14.4 09/25/2021 0500   RDW 13.8 01/02/2017 0737   LYMPHSABS 0.9 09/25/2021 0500   LYMPHSABS 1.9 01/02/2017 0737   MONOABS 0.4 09/25/2021 0500   MONOABS 0.5 01/02/2017 0737   EOSABS 0.2 09/25/2021 0500   EOSABS 0.2 01/02/2017 0737   BASOSABS 0.0 09/25/2021 0500   BASOSABS 0.1 01/02/2017 0737    BMET    Component Value Date/Time   NA 139 09/25/2021 0500   NA 136 09/03/2018 1122   NA 140 01/02/2017 0737   K 4.6 09/25/2021 0500   K 4.4 01/02/2017 0737   CL 105 09/25/2021 0500   CO2 28 09/25/2021 0500   CO2 29 01/02/2017 0737   GLUCOSE 96 09/25/2021 0500   GLUCOSE 81 01/02/2017 0737   BUN 12 09/25/2021 0500   BUN 16 09/03/2018 1122   BUN 17.2 01/02/2017 0737   CREATININE 0.80 09/25/2021 0500   CREATININE 0.91 01/04/2020 1353   CREATININE 0.7 01/02/2017 0737   CALCIUM 8.1 (L) 09/25/2021 0500   CALCIUM 9.7 01/02/2017 0737   GFRNONAA >60 09/25/2021 0500   GFRNONAA >60 01/04/2020 1353   GFRAA >60 04/30/2019 1030   GFRAA >60 01/05/2019 0950    INR    Component Value  Date/Time   INR 1.3 (H) 09/22/2021 0404     Intake/Output Summary (Last 24 hours) at 09/26/2021 0746 Last data filed at 09/26/2021 0434 Gross per 24 hour  Intake 240 ml  Output 1875 ml  Net -1635 ml     Assessment/Plan:  71 y.o. female is s/p ABF bypass with B  CFA endarterectomies 6 Days Post-Op   BLE well perfused with palpable DP pulses Abd and groin incisions well appearing GI: tolerated regular diet yesterday TOC consulted for [facility] PT Possible d/c home tomorrow with [facility]   Dagoberto Ligas, PA-C [facility] [phone] 09/26/2021 7:46 AM   VASCULAR STAFF ADDENDUM: I have independently interviewed and examined the patient. I agree with the above.  Continues to make great progress. Incisions healing well. Palpable pedal pulses. No need to check further laboratory data. Mobilize as able with PT / OT. Anticipate discharge tomorrow.  Yevonne Aline. Stanford Breed, MD [facility] Phone Number: [phone] 09/26/2021 8:26 AM

## 2021-09-26 NOTE — Progress Notes (Signed)
Physical Therapy Treatment Patient Details Name: Teresa [name] MRN: [id number] DOB: 19-Sep-1950 Today's Date: 09/26/2021   History of Present Illness 71 yo female admitted 7/18 for aortobifem BPG which required left renal vein ligation operatively with post op hematuria. PMHx: COPD, CAD, TAVR, CHF, GERD, HLD, ICM, PVD, lung CA s/p Rt lobectomy    PT Comments    Pt received supine, motivated for session with continued progress. Pt able to complete all bed mobility at supervision level, however needing light min assist to bring BLE into bed at end of session. Pt min guard for increased ambulation this session with cues for increasing stride length as pt with short shuffling steps to start with pt able to correct and maintain, HR up to 122bpm during activity. SpO2 down to 88% on RA during ambulation with cues needed for breathing techniques with recovery >90% and pt able to maintain. Pt continues to benefit from skilled PT services to progress toward functional mobility goals.    Recommendations for follow up therapy are one component of a multi-disciplinary discharge planning process, led by the attending physician.  Recommendations may be updated based on patient status, additional functional criteria and insurance authorization.  Follow Up Recommendations  Home health PT     Assistance Recommended at Discharge    Patient can return home with the following A little help with walking and/or transfers;A little help with bathing/dressing/bathroom;Assistance with cooking/housework;Assist for transportation;Help with stairs or ramp for entrance   Equipment Recommendations  BSC/3in1    Recommendations for Other Services       Precautions / Restrictions Precautions Precautions: Fall Restrictions Weight Bearing Restrictions: No     Mobility  Bed Mobility Overal bed mobility: Needs Assistance Bed Mobility: Rolling, Sidelying to Sit, Sit to Supine Rolling: Supervision Sidelying to sit:  Supervision   Sit to supine: Min assist   General bed mobility comments: light min assist to retun BLE to bed    Transfers Overall transfer level: Needs assistance Equipment used: Rolling walker (2 wheels) Transfers: Sit to/from Stand Sit to Stand: Supervision Stand pivot transfers: Min assist         General transfer comment: min cues for hand placement    Ambulation/Gait Ambulation/Gait assistance: Min guard Gait Distance (Feet): 132 Feet Assistive device: Rolling walker (2 wheels) Gait Pattern/deviations: Step-through pattern, Decreased stride length       General Gait Details: min guard for safety, cues for increased stide lenght as pt with shuffling steps at start, SpO2 88% briefly on RA, >90% throughout when talking less, HR up to 122bpm max   Stairs             Wheelchair Mobility    Modified Rankin (Stroke Patients Only)       Balance Overall balance assessment: Needs assistance Sitting-balance support: Feet supported Sitting balance-Leahy Scale: Good Sitting balance - Comments: minguard for sitting EOB, patient afraid she may damage stitches   Standing balance support: Bilateral upper extremity supported, No upper extremity supported Standing balance-Leahy Scale: Fair Standing balance comment: RW to ambulate but could static stand without AD                            Cognition Arousal/Alertness: Awake/alert Behavior During Therapy: WFL for tasks assessed/performed Overall Cognitive Status: Within Functional Limits for tasks assessed  Exercises      General Comments General comments (skin integrity, edema, etc.): SpO2 99% on 2L O2 on entry, SpO2 95% on RA at rest but down to 88% with activity briefly, up to >90% with less talking and cues for breathing techniques.  HR up to 120's with actiivty.  replaced 2L at end of session SpO2 99%      Pertinent Vitals/Pain Pain  Assessment Pain Assessment: Faces Faces Pain Scale: Hurts a little bit Pain Location: abdomen Pain Descriptors / Indicators: Discomfort, Grimacing, Guarding Pain Intervention(s): Monitored during session, Limited activity within patient's tolerance    Home Living                          Prior Function            PT Goals (current goals can now be found in the care plan section) Acute Rehab PT Goals Patient Stated Goal: return home PT Goal Formulation: With patient/family Time For Goal Achievement: 10/05/21    Frequency    Min 3X/week      PT Plan Current plan remains appropriate    Co-evaluation              AM-PAC PT "6 Clicks" Mobility   Outcome Measure  Help needed turning from your back to your side while in a flat bed without using bedrails?: A Little Help needed moving from lying on your back to sitting on the side of a flat bed without using bedrails?: A Little Help needed moving to and from a bed to a chair (including a wheelchair)?: A Little Help needed standing up from a chair using your arms (e.g., wheelchair or bedside chair)?: A Little Help needed to walk in hospital room?: A Little Help needed climbing 3-5 steps with a railing? : A Little 6 Click Score: 18    End of Session   Activity Tolerance: Patient tolerated treatment well Patient left: with call bell/phone within reach;in bed;with nursing/sitter in room Nurse Communication: Mobility status PT Visit Diagnosis: Other abnormalities of gait and mobility (R26.89);Difficulty in walking, not elsewhere classified (R26.2);Muscle weakness (generalized) (M62.81)     Time: 9242-6834 PT Time Calculation (min) (ACUTE ONLY): 30 min  Charges:  $Gait Training: 8-22 mins $Therapeutic Activity: 8-22 mins                    Teresa [name] R. PTA Acute Rehabilitation Services Office: [place] 09/26/2021, 10:31 AM

## 2021-09-26 NOTE — Care Management Important Message (Signed)
Important Message  Patient Details  Name: [name] MRN: [id number] Date of Birth: Oct 10, 1950   Medicare Important Message Given:  Yes     [name] 09/26/2021, 1:03 PM

## 2021-09-27 ENCOUNTER — Other Ambulatory Visit (HOSPITAL_COMMUNITY): Payer: Self-pay

## 2021-09-27 ENCOUNTER — Telehealth: Payer: Self-pay

## 2021-09-27 LAB — GLUCOSE, CAPILLARY: Glucose-Capillary: 78 mg/dL (ref 70–99)

## 2021-09-27 MED ORDER — ONDANSETRON HCL 4 MG PO TABS: 4.0000 mg | ORAL_TABLET | Freq: Every day | ORAL | 0 refills | Status: DC | PRN

## 2021-09-27 MED ORDER — OXYCODONE HCL 5 MG PO TABS: 5.0000 mg | ORAL_TABLET | Freq: Four times a day (QID) | ORAL | 0 refills | Status: DC | PRN

## 2021-09-27 NOTE — TOC Transition Note (Signed)
Transition of Care (TOC) - CM/SW Discharge Note Marvetta Gibbons RN, BSN Transitions of Care Unit 4E- RN Case Manager See Treatment Team for direct phone #    Patient Details  Name: Teresa [name] MRN: [id number] Date of Birth: 03/22/1950  Transition of Care [facility]) CM/SW Contact:  Dawayne Patricia, RN Phone Number: 09/27/2021, 9:41 AM   Clinical Narrative:    Pt stable for transition home today, HH has been arranged with [facility].  CM spoke with pt at bedside- per pt she has heard from [facility] and they will call her post discharge and let her know when they are coming on Friday for first [facility] visit.   Pt also reports she has borrowed a RW from friend, she has a standard walker of her own. Her son has also ordered a shower chair for her. She is going to wait on [facility] and she how she does at home- son will get one if she decides she needs one.   No further TOC needs noted, pt states her son will be here to transport home between 10-11am.    Final next level of care: [place] Barriers to Discharge: Barriers Resolved   Patient Goals and CMS Choice Patient states their goals for this hospitalization and ongoing recovery are:: return home CMS Medicare.gov Compare Post Acute Care list provided to:: Patient Choice offered to / list presented to : Patient  Discharge Placement               Home w/ [facility]        Discharge Plan and Services   Discharge Planning Services: CM Consult Post Acute Care Choice: Home Health          DME Arranged: N/A DME Agency: NA       HH Arranged: PT HH Agency: [facility] Date New [place]: 09/25/21 Time [place]: 1006 Representative spoke with at [place]: [place] ([place]) Interventions     Readmission Risk Interventions    09/27/2021    9:41 AM  Readmission Risk Prevention Plan  Transportation Screening Complete  PCP or Specialist Appt within 5-7 Days Complete   Home Care Screening Complete  Medication Review (RN CM) Complete

## 2021-09-27 NOTE — Progress Notes (Signed)
  Progress Note    09/27/2021 7:30 AM 7 Days Post-Op  Subjective:  no complaints.  Tolerating a regular diet.  Ambulating without difficulty.  Ready for discharge home   Vitals:   09/27/21 0057 09/27/21 0424  BP: (!) 106/95 (!) 144/66  Pulse: 70 76  Resp: 20 20  Temp: 98.5 F (36.9 C) 98.6 F (37 C)  SpO2: 100% 100%   Physical Exam: Lungs:  non labored Incisions:  abd and groin incions c/d/i Extremities:  palpable DP pulses Abdomen:  soft, NT, ND Neurologic: A&O  CBC    Component Value Date/Time   WBC 4.8 09/25/2021 0500   RBC 3.27 (L) 09/25/2021 0500   HGB 10.1 (L) 09/25/2021 0500   HGB 12.6 01/04/2020 1353   HGB 13.9 01/02/2017 0737   HCT 30.3 (L) 09/25/2021 0500   HCT 42.0 01/02/2017 0737   PLT 135 (L) 09/25/2021 0500   PLT 297 01/04/2020 1353   PLT 264 01/02/2017 0737   MCV 92.7 09/25/2021 0500   MCV 89.2 01/02/2017 0737   MCH 30.9 09/25/2021 0500   MCHC 33.3 09/25/2021 0500   RDW 14.4 09/25/2021 0500   RDW 13.8 01/02/2017 0737   LYMPHSABS 0.9 09/25/2021 0500   LYMPHSABS 1.9 01/02/2017 0737   MONOABS 0.4 09/25/2021 0500   MONOABS 0.5 01/02/2017 0737   EOSABS 0.2 09/25/2021 0500   EOSABS 0.2 01/02/2017 0737   BASOSABS 0.0 09/25/2021 0500   BASOSABS 0.1 01/02/2017 0737    BMET    Component Value Date/Time   NA 139 09/25/2021 0500   NA 136 09/03/2018 1122   NA 140 01/02/2017 0737   K 4.6 09/25/2021 0500   K 4.4 01/02/2017 0737   CL 105 09/25/2021 0500   CO2 28 09/25/2021 0500   CO2 29 01/02/2017 0737   GLUCOSE 96 09/25/2021 0500   GLUCOSE 81 01/02/2017 0737   BUN 12 09/25/2021 0500   BUN 16 09/03/2018 1122   BUN 17.2 01/02/2017 0737   CREATININE 0.80 09/25/2021 0500   CREATININE 0.91 01/04/2020 1353   CREATININE 0.7 01/02/2017 0737   CALCIUM 8.1 (L) 09/25/2021 0500   CALCIUM 9.7 01/02/2017 0737   GFRNONAA >60 09/25/2021 0500   GFRNONAA >60 01/04/2020 1353   GFRAA >60 04/30/2019 1030   GFRAA >60 01/05/2019 0950    INR    Component  Value Date/Time   INR 1.3 (H) 09/22/2021 0404     Intake/Output Summary (Last 24 hours) at 09/27/2021 0730 Last data filed at 09/27/2021 0426 Gross per 24 hour  Intake 840 ml  Output 2650 ml  Net -1810 ml     Assessment/Plan:  71 y.o. female is s/p ABF 7 Days Post-Op   BLE well perfused with palpable DP pulses Abd and groin incisions c/d/i Tolerating a regular diet; BM yesterday TOC arranged [place] for discharge home; follow up in 2 weeks will be arranged by our office staff    Dagoberto Ligas, PA-C [facility] [phone] 09/27/2021 7:30 AM

## 2021-09-27 NOTE — Telephone Encounter (Signed)
Pt called stating that she was finally d/c'd from the hospital today. She was concerned about weight gain from 125 lb pre-admit to 151 lb at d/c. She was requesting reassurance and advice.  Reviewed pt's chart, returned pt's call, two identifiers used. Pt denies any SOB, dry cough, or difficulty breathing. She states that her legs and feet have significant swelling. Instructed pt to elevate her legs often and walk when she can. Asked pt to contact her heart failure provider to ask about increasing her Lasix for this week, as she currently takes it twice weekly, or other recommendations. Confirmed understanding.

## 2021-09-27 NOTE — Progress Notes (Signed)
Patient given discharge instructions. Son present. PIVs removed. Telemetry box removed, CCMD notified. Patient taken to vehicle by staff in wheelchair.  Daymon Larsen, RN

## 2021-09-27 NOTE — Discharge Instructions (Signed)
 [facility]  Discharge Instructions   Open Aortic Surgery  Please refer to the following instructions for your post-procedure care. Your surgeon or Physician Assistant will discuss any changes with you.  Activity  Avoid lifting more than eight pounds (a gallon of milk) until after your first post-operative visit. You are encouraged to walk as much as you can. You can slowly return to normal activities but must avoid strenuous activity and heavy lifting until your doctor tells you it's OK. Heavy lifting can hurt the incision and cause a hernia. Avoid activities such as vacuuming or swinging a golf club. It is normal to feel tired for several weeks after your surgery. Do not drive until your doctor gives the OK and you are no longer taking prescription pain medications. It is also normal to have difficulty with sleep habits, eating and bowl movements after surgery. These will go away with time.  Bathing/Showering  You may shower after you go home. Do not soak in a bathtub, hot tub, or swim until the incision heals.  Incision Care  Shower every day. Clean your incision with mild soap and water. Pat the area dry with a clean towel. You do not need a bandage unless otherwise instructed. Do not apply any ointments or creams to your incision. You may have skin glue on your incision. Do not peel it off. It will come off on its own in about one week. If you have staples or sutures along your incision, they will be removed at your post op appointment.  Diet  Resume your normal diet. There are no special food restriction following this procedure. A low fat/low cholesterol diet is recommended for all patients with vascular disease. After your aortic surgery, it's normal to feel full faster than usual and to not feel as hungry as you normally would. You will probably lose weight initially following your surgery. It's best to eat small, frequent meals over the course of  the day. Call the office if you find that you are unable to eat even small meals. In order to heal from your surgery, it is CRITICAL to get adequate nutrition. Your body requires vitamins, minerals, and protein. Vegetables are the best source of vitamins and minerals. causing pain, you may take over-the-counter pain reliever such as acetaminophen (Tylenol). If you were prescribed a stronger pain medication, please be aware these medication can cause nausea and constipation. Prevent nausea by taking the medication with a snack or meal. Avoid constipation by drinking plenty of fluids and eating foods with a high amount of fiber, such as fruits, vegetables and grains. Take 100mg of the over-the-counter stool softener Colace twice a day as needed to help with constipation. A laxative, such as Milk of Magnesia, may be recommended for you at this time. Do not take a laxative unless your surgeon or P.A. tells you it's OK. Do not take Tylenol if you are taking stronger pain medications (such as Percocet).  Follow Up  Our office will schedule a follow up appointment 2-3 weeks after discharge.  Please call us immediately for any of the following conditions    .     Severe or worsening pain in your legs or feet or in your abdomen back or chest. Increased pain, redness drainage (pus) from your incision site. Increased abdominal pain, bloating, nausea, vomiting, or persistent diarrhea. Fever of 101 degrees or higher. Swelling in your leg (s).  Reduce your risk of vascular disease  Stop smoking.   If you would like help, call [facility] at 1-800-QUIT-NOW (1-800-784-8669) or [facility] at 336-586-4000. Manage your cholesterol Maintain a desired weight Control your diabetes Keep your blood pressure down  If you have any questions please call the office at [phone].   

## 2021-09-28 ENCOUNTER — Telehealth: Payer: Self-pay

## 2021-09-28 ENCOUNTER — Telehealth (HOSPITAL_COMMUNITY): Payer: Self-pay | Admitting: Cardiology

## 2021-09-28 NOTE — Telephone Encounter (Signed)
Representative from insurance called for PA for Ondansetron prescription.   Called rep and completed PA. Decision will be faxed to office. Confirmed understanding.

## 2021-09-28 NOTE — Telephone Encounter (Signed)
Patient left voicemail on triage line  Reports she was recently in the hospital for procedure (aorta bifemoral bypass graft) Reports her weight shot up to 151 lb and she is normally 125  Was advised at discharge to restart medication therapy as previously ordered. due for a dose of lasix 7/28 as she take twice a week on Mondays and Fridays  Today her weight is 140 and swelling has come down a lot.  Wanted to let office know of major weight increase and swelling.   Denies CP increased SOB   Will she need any additional diuretics. Surgery follow up 8/22  No hf fu at this time

## 2021-09-29 DIAGNOSIS — I7409 Other arterial embolism and thrombosis of abdominal aorta: Secondary | ICD-10-CM | POA: Diagnosis not present

## 2021-09-29 DIAGNOSIS — I110 Hypertensive heart disease with heart failure: Secondary | ICD-10-CM | POA: Diagnosis not present

## 2021-09-29 DIAGNOSIS — Z09 Encounter for follow-up examination after completed treatment for conditions other than malignant neoplasm: Secondary | ICD-10-CM | POA: Diagnosis not present

## 2021-09-29 DIAGNOSIS — Z48812 Encounter for surgical aftercare following surgery on the circulatory system: Secondary | ICD-10-CM | POA: Diagnosis not present

## 2021-09-29 DIAGNOSIS — Z954 Presence of other heart-valve replacement: Secondary | ICD-10-CM | POA: Diagnosis not present

## 2021-09-29 DIAGNOSIS — I340 Nonrheumatic mitral (valve) insufficiency: Secondary | ICD-10-CM | POA: Diagnosis not present

## 2021-09-29 DIAGNOSIS — Z7902 Long term (current) use of antithrombotics/antiplatelets: Secondary | ICD-10-CM | POA: Diagnosis not present

## 2021-09-29 DIAGNOSIS — Z9889 Other specified postprocedural states: Secondary | ICD-10-CM | POA: Diagnosis not present

## 2021-09-29 DIAGNOSIS — I5043 Acute on chronic combined systolic (congestive) and diastolic (congestive) heart failure: Secondary | ICD-10-CM | POA: Diagnosis not present

## 2021-09-29 DIAGNOSIS — Z8679 Personal history of other diseases of the circulatory system: Secondary | ICD-10-CM | POA: Diagnosis not present

## 2021-09-29 DIAGNOSIS — Z955 Presence of coronary angioplasty implant and graft: Secondary | ICD-10-CM | POA: Diagnosis not present

## 2021-09-29 DIAGNOSIS — I7143 Infrarenal abdominal aortic aneurysm, without rupture: Secondary | ICD-10-CM | POA: Diagnosis not present

## 2021-09-29 DIAGNOSIS — I739 Peripheral vascular disease, unspecified: Secondary | ICD-10-CM | POA: Diagnosis not present

## 2021-09-29 NOTE — Discharge Summary (Signed)
Discharge Summary  Patient ID: Teresa [name] [id number] 70 y.o. 28-Dec-1950  Admit date: 09/20/2021  Discharge date and time: 09/27/2021 11:31 AM   Admitting Physician: [name], MD   Discharge Physician: same  Admission Diagnoses: Aortoiliac occlusive disease ([place]) [I74.09]  Discharge Diagnoses: same  Admission Condition: fair  Discharged Condition: fair  Indication for Admission: post operative care after open aortic surgery  Hospital Course: [name] is a 71 year old female with small aortic aneurysm as well as aortic occlusive disease.  She underwent infrarenal abdominal aortic endarterectomy with aortobifemoral bypass as well as bilateral common femoral artery endarterectomies by Dr. Stanford Breed on 09/20/2021.  An epidural was also placed perioperatively.  She tolerated the procedure well and was admitted to the ICU postoperatively.  The critical care team was consulted for help with intraoperative hypotension due to blood loss.  She remained in the ICU on pressor support for several days.  Eventually she was weaned from pressor support and transferred to stepdown unit after about 4 days in the ICU.  On the fifth day her epidural was removed.  Much of her hospital stay consisted of increasing mobility after epidural removal as well as advancing her diet.  Diet was advanced slow due to some nausea postoperatively.  Based on PT and OT recommendations Home health physical therapy was arranged by the transition of care team.  At the time of discharge patient was tolerating a regular diet, ambulating without difficulty, and ready for discharge home.  It should be noted she had palpable DP pulses at the time of discharge.  Incisions of the abdomen and groins were also well-appearing.  She will follow-up in the office in about 2 to 3 weeks.  She was discharged home in stable condition.  Consults:  critical care  Treatments: surgery: aortobifemoral bypass with aortic  endarterectomy and bilateral common femoral artery endarterectomies 09/20/21  Discharge Exam: See progress note 7/26 Vitals:   09/27/21 0424 09/27/21 0750  BP: (!) 144/66 (!) 134/56  Pulse: 76 92  Resp: 20 18  Temp: 98.6 F (37 C) 98.2 F (36.8 C)  SpO2: 100% 91%     Disposition: Discharge disposition: 01-Home or Self Care       Patient Instructions:  Allergies as of 09/27/2021       Reactions   Azithromycin Shortness Of Breath   Ceclor [cefaclor] Shortness Of Breath, Rash   Penicillins Other (See Comments)   Unknown from childhood Did it involve swelling of the face/tongue/throat, SOB, or low BP? No Did it involve sudden or severe rash/hives, skin peeling, or any reaction on the inside of your mouth or nose? No Did you need to seek medical attention at a hospital or doctor's office? Yes When did it last happen? Infancy  If all above answers are "NO", may proceed with cephalosporin use.   Septra [sulfamethoxazole-trimethoprim] Shortness Of Breath, Rash   Doxycycline Swelling, Other (See Comments)   Redness on the face   Lipitor [atorvastatin] Other (See Comments)   Severe muscle aches   Morphine And Related Nausea And Vomiting, Other (See Comments)   Terrible headache   Zetia [ezetimibe] Other (See Comments)   Severe stomach pain   Zocor [simvastatin] Other (See Comments)   Severe muscle aches   Adhesive [tape] Other (See Comments)   Redness and swelling - use paper tape   Vicodin [hydrocodone-acetaminophen] Nausea And Vomiting        Medication List     STOP taking these medications  clindamycin 300 MG capsule Commonly known as: CLEOCIN       TAKE these medications    AeroChamber MV inhaler Use as instructed   albuterol 108 (90 Base) MCG/ACT inhaler Commonly known as: VENTOLIN HFA Inhale 2 puffs into the lungs every 6 (six) hours as needed for wheezing or shortness of breath.   albuterol (2.5 MG/3ML) 0.083% nebulizer solution Commonly known  as: PROVENTIL Take 2.5 mg by nebulization every 6 (six) hours as needed for shortness of breath or wheezing.   bisoprolol 5 MG tablet Commonly known as: ZEBETA TAKE 1/2 TABLET BY MOUTH EVERY DAY   clopidogrel 75 MG tablet Commonly known as: PLAVIX TAKE 1 TABLET BY MOUTH EVERY DAY   [employer] MENOPAUSE SUPPORT PO Take 1 capsule by mouth in the morning.   Entresto 24-26 MG Generic drug: sacubitril-valsartan Take 1 tablet by mouth 2 (two) times daily. Please wait to begin for 36 hours after discontinuing the Losartan.   furosemide 40 MG tablet Commonly known as: LASIX TAKE 1 TABLET BY MOUTH AS NEEDED FOR FLUID OR EDEMA What changed: See the new instructions.   Hypromellose 0.2 % Soln Place 2 drops into both eyes 4 (four) times daily as needed (dry eyes).   Jardiance 10 MG Tabs tablet Generic drug: empagliflozin Take 1 tablet by mouth daily.   lansoprazole 30 MG capsule Commonly known as: PREVACID Take 30 mg by mouth in the morning.   NASAL SALINE NA Place 1 spray into the nose daily as needed (congestion).   nitroGLYCERIN 0.4 MG SL tablet Commonly known as: NITROSTAT DISSOLVE ONE TABLET UNDER TONGUE AS NEEDED FOR CHEST PAIN EVERY 5 MINUTES   ondansetron 4 MG tablet Commonly known as: Zofran Take 1 tablet (4 mg total) by mouth daily as needed for nausea or vomiting.   oxyCODONE 5 MG immediate release tablet Commonly known as: Oxy IR/ROXICODONE Take 1 tablet (5 mg total) by mouth every 6 (six) hours as needed for severe pain.   Praluent 150 MG/ML Soaj Generic drug: Alirocumab INJECT 1 PEN INTO THE SKIN EVERY 14 (FOURTEEN) DAYS.   rosuvastatin 5 MG tablet Commonly known as: CRESTOR Take 1 tablet (5 mg total) by mouth 3 (three) times a week.   simethicone 80 MG chewable tablet Commonly known as: MYLICON Chew 2 tablets (160 mg total) by mouth every 6 (six) hours as needed for flatulence.   spironolactone 25 MG tablet Commonly known as: ALDACTONE TAKE 1 TABLET(25 MG)  BY MOUTH DAILY   Trelegy Ellipta 100-62.5-25 MCG/ACT Aepb Generic drug: Fluticasone-Umeclidin-Vilant Inhale 1 puff into the lungs daily.       Activity: activity as tolerated Diet: regular diet Wound Care: none needed  Follow-up with [facility] in 2 weeks.  Signed: Dagoberto Ligas, PA-C 09/29/2021 11:11 AM [facility] Office: [phone]

## 2021-09-30 DIAGNOSIS — I70213 Atherosclerosis of native arteries of extremities with intermittent claudication, bilateral legs: Secondary | ICD-10-CM | POA: Diagnosis not present

## 2021-09-30 DIAGNOSIS — Z7902 Long term (current) use of antithrombotics/antiplatelets: Secondary | ICD-10-CM | POA: Diagnosis not present

## 2021-09-30 DIAGNOSIS — Z95820 Peripheral vascular angioplasty status with implants and grafts: Secondary | ICD-10-CM | POA: Diagnosis not present

## 2021-10-03 DIAGNOSIS — I7143 Infrarenal abdominal aortic aneurysm, without rupture: Secondary | ICD-10-CM | POA: Diagnosis not present

## 2021-10-03 DIAGNOSIS — Z7902 Long term (current) use of antithrombotics/antiplatelets: Secondary | ICD-10-CM | POA: Diagnosis not present

## 2021-10-03 DIAGNOSIS — Z954 Presence of other heart-valve replacement: Secondary | ICD-10-CM | POA: Diagnosis not present

## 2021-10-03 DIAGNOSIS — I340 Nonrheumatic mitral (valve) insufficiency: Secondary | ICD-10-CM | POA: Diagnosis not present

## 2021-10-03 DIAGNOSIS — I739 Peripheral vascular disease, unspecified: Secondary | ICD-10-CM | POA: Diagnosis not present

## 2021-10-03 DIAGNOSIS — Z955 Presence of coronary angioplasty implant and graft: Secondary | ICD-10-CM | POA: Diagnosis not present

## 2021-10-03 DIAGNOSIS — Z48812 Encounter for surgical aftercare following surgery on the circulatory system: Secondary | ICD-10-CM | POA: Diagnosis not present

## 2021-10-03 DIAGNOSIS — I7409 Other arterial embolism and thrombosis of abdominal aorta: Secondary | ICD-10-CM | POA: Diagnosis not present

## 2021-10-03 DIAGNOSIS — I5043 Acute on chronic combined systolic (congestive) and diastolic (congestive) heart failure: Secondary | ICD-10-CM | POA: Diagnosis not present

## 2021-10-03 DIAGNOSIS — I110 Hypertensive heart disease with heart failure: Secondary | ICD-10-CM | POA: Diagnosis not present

## 2021-10-03 NOTE — Telephone Encounter (Signed)
Appointment scheduled.

## 2021-10-06 ENCOUNTER — Encounter (HOSPITAL_COMMUNITY): Payer: Self-pay | Admitting: Vascular Surgery

## 2021-10-20 ENCOUNTER — Encounter (HOSPITAL_COMMUNITY): Payer: Self-pay

## 2021-10-20 ENCOUNTER — Ambulatory Visit (HOSPITAL_COMMUNITY)
Admission: RE | Admit: 2021-10-20 | Discharge: 2021-10-20 | Disposition: A | Discharge status: Home / Self Care | Payer: PPO | Source: Ambulatory Visit | Attending: Family Medicine | Admitting: Family Medicine

## 2021-10-20 VITALS — BP 136/90 | HR 74 | Ht 59.0 in | Wt 119.0 lb

## 2021-10-20 DIAGNOSIS — Z7902 Long term (current) use of antithrombotics/antiplatelets: Secondary | ICD-10-CM | POA: Insufficient documentation

## 2021-10-20 DIAGNOSIS — Z7984 Long term (current) use of oral hypoglycemic drugs: Secondary | ICD-10-CM | POA: Insufficient documentation

## 2021-10-20 DIAGNOSIS — I2510 Atherosclerotic heart disease of native coronary artery without angina pectoris: Secondary | ICD-10-CM | POA: Diagnosis not present

## 2021-10-20 DIAGNOSIS — J449 Chronic obstructive pulmonary disease, unspecified: Secondary | ICD-10-CM

## 2021-10-20 DIAGNOSIS — I340 Nonrheumatic mitral (valve) insufficiency: Secondary | ICD-10-CM

## 2021-10-20 DIAGNOSIS — I739 Peripheral vascular disease, unspecified: Secondary | ICD-10-CM

## 2021-10-20 DIAGNOSIS — Z85118 Personal history of other malignant neoplasm of bronchus and lung: Secondary | ICD-10-CM

## 2021-10-20 DIAGNOSIS — R0989 Other specified symptoms and signs involving the circulatory and respiratory systems: Secondary | ICD-10-CM | POA: Diagnosis not present

## 2021-10-20 DIAGNOSIS — Z952 Presence of prosthetic heart valve: Secondary | ICD-10-CM | POA: Insufficient documentation

## 2021-10-20 DIAGNOSIS — I5042 Chronic combined systolic (congestive) and diastolic (congestive) heart failure: Secondary | ICD-10-CM

## 2021-10-20 DIAGNOSIS — E785 Hyperlipidemia, unspecified: Secondary | ICD-10-CM | POA: Diagnosis not present

## 2021-10-20 DIAGNOSIS — Z79899 Other long term (current) drug therapy: Secondary | ICD-10-CM | POA: Insufficient documentation

## 2021-10-20 DIAGNOSIS — I255 Ischemic cardiomyopathy: Secondary | ICD-10-CM | POA: Diagnosis not present

## 2021-10-20 LAB — BASIC METABOLIC PANEL
Anion gap: 12 (ref 5–15)
BUN: 11 mg/dL (ref 8–23)
CO2: 21 mmol/L — ABNORMAL LOW (ref 22–32)
Calcium: 9 mg/dL (ref 8.9–10.3)
Chloride: 105 mmol/L (ref 98–111)
Creatinine, Ser: 0.89 mg/dL (ref 0.44–1.00)
GFR, Estimated: >60 mL/min (ref >60)
Glucose, Bld: 100 mg/dL — ABNORMAL HIGH (ref 70–99)
Potassium: 3.8 mmol/L (ref 3.5–5.1)
Sodium: 138 mmol/L (ref 135–145)

## 2021-10-20 LAB — CBC
HCT: 40 % (ref 36.0–46.0)
Hemoglobin: 12.1 g/dL (ref 12.0–15.0)
MCH: 30.1 pg (ref 26.0–34.0)
MCHC: 30.3 g/dL (ref 30.0–36.0)
MCV: 99.5 fL (ref 80.0–100.0)
Platelets: 344 10*3/uL (ref 150–400)
RBC: 4.02 MIL/uL (ref 3.87–5.11)
RDW: 14.5 % (ref 11.5–15.5)
WBC: 7 10*3/uL (ref 4.0–10.5)
nRBC: 0 % (ref 0.0–0.2)

## 2021-10-20 NOTE — Progress Notes (Signed)
PCP: Teresa Cha, MD Primary Cardiologist: Dr Teresa [name]   HPI: [name] is a 71 y.o. woman year old with history of COPD, lung CA s/p RUL resection 2014, CAD (prior stenting of the LM, LAD and circumflex in the setting of dissection), AS s/p TAVR 8/34 and systolic HF with recovered EF.    1 yr TAVR echo: Echo 7/21 EF 40-45%, normally functioning TAVR with a mean gradient of 14 mmHg and trivial-mild PVL. 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 Mild MR.  Seen in 5/22 for unscheduled visit due to CP. Had mild volume overload and given additional lasix.   Echo  04/21/21 EF 50-55% apical aneurysm.   S/p infrarenal abdominal aortic endarterectomy with aortobifemoral bypass & bilateral common femoral artery endarterectomies by Dr. Stanford Breed on 09/20/2021  Today she returns for post hospital HF follow up. Overall feeling great! Can now walk up and down driveway without leg pain. No SOB with ADLs or walking. Denies palpitations, abnormal bleeding, CP, dizziness, edema, or PND/Orthopnea. Appetite ok. No fever or chills. Weight at home 120 pounds. Taking all medications.    Cardiac Studies:   - Echo 04/21/21 EF 50-55% apical aneurysm.   - Echo 09/24/18 EF 30-35%. TAVR ok. Mild MR.   - Echo 08/2018 EF 25% , severe aortic stenosis, severe mitral regurgitation.    - LHC 08/19/2018 1. Triple vessel CAD.  2. Patent stents in the left main, LAD and Circumflex (placed at the time of dissection) 3. Chronic occlusion small Obtuse marginal branch which fills from right to left collaterals 4. Patent mid to distal RCA stent. The proximal RCA has a moderate non-obstructive stenosis which does not appear to be flow limiting. This is unchanged from her last cath.  5. Severe aortic stenosis (mean gradient 21 mmHg, peak to peak gradient 36 mmHg, AVA 0.71 cm2) 6. Elevated filling pressures c/w acute volume overload (RA 9/14,   ROS: All systems negative except as listed in HPI, PMH  and Problem List.  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  Tobacco Use   Smoking status: Former    Packs/day: 0.50    Years: 40.00    Total pack years: 20.00    Types: Cigarettes    Quit date: 10/08/2012    Years since quitting: 9.0   Smokeless tobacco: Never  Vaping Use   Vaping Use: Never used  Substance and Sexual Activity   Alcohol use: No   Drug use: No   Sexual activity: Yes  Other Topics Concern   Not on file  Social History Narrative   Not on file   Social Determinants of Health   Financial Resource Strain: Low Risk  (10/07/2018)   Overall Financial Resource Strain (CARDIA)    Difficulty of Paying Living Expenses: Not hard at all  Food Insecurity: No Food Insecurity (10/07/2018)   Hunger Vital Sign    Worried About Running Out of Food in the Last Year: Never true    Teresa [name] in the Last Year: Never true  Transportation Needs: No Transportation Needs (10/07/2018)   PRAPARE - Hydrologist (Medical): No    Lack of Transportation (Non-Medical): No  Physical Activity: Insufficiently Active (10/07/2018)  Exercise Vital Sign    Days of Exercise per Week: 4 days    Minutes of Exercise per Session: 30 min  Stress: No Stress Concern Present (10/07/2018)   [place]    Feeling of Stress : Only a little  Social Connections: Not on file  Intimate Partner Violence: Not on file    FH:  Family History  Problem Relation Age of Onset   Alcohol abuse Father    Lupus Mother    Other Sister        Degenerative disc disease   Migraines Sister    Pulmonary fibrosis Brother    Heart attack Neg Hx     Past Medical History:  Diagnosis Date   AAA (abdominal aortic  aneurysm) ([place])    a. small by cath 2014.   Acute on chronic combined systolic and diastolic heart failure ([place]) 08/19/2018   Arthritis    "across my hips; buttocks; comes w/the weather" (02/27/2013)   CAD (coronary artery disease), native coronary artery    a. 10/2012 - rotational atherectomy of LAD c/b dissection s/p PCI to LAD, LCx, LM. b. 12/2012 - s/p overlapping DES to RCA.   Cancer [facility])    Cataract    "just the beginnings on the right" (02/27/2013)   Chronic combined systolic and diastolic CHF (congestive heart failure) (HCC)    COPD (chronic obstructive pulmonary disease) (HCC)    Coronary artery disease    GERD (gastroesophageal reflux disease)    Hypercholesterolemia    Ischemic cardiomyopathy    a. EF 45-50%.   Mass of lung    "small tumor RUL; they are watching it" (02/27/2013)   Mitral regurgitation    Peripheral vascular disease, unspecified (HCC)    Pneumonia    PONV (postoperative nausea and vomiting)    S/P TAVR (transcatheter aortic valve replacement)    23 mm Edwards Sapien 3 transcatheter heart valve placed via open left transaxillary approach    Severe aortic stenosis    Severe mitral regurgitation    Tachycardia, unspecified    Tobacco abuse     Current Outpatient Medications  Medication Sig Dispense Refill   albuterol (PROVENTIL) (2.5 MG/3ML) 0.083% nebulizer solution Take 2.5 mg by nebulization every 6 (six) hours as needed for shortness of breath or wheezing.     albuterol (VENTOLIN HFA) 108 (90 Base) MCG/ACT inhaler Inhale 2 puffs into the lungs every 6 (six) hours as needed for wheezing or shortness of breath.     Alirocumab (PRALUENT) 150 MG/ML SOAJ INJECT 1 PEN INTO THE SKIN EVERY 14 (FOURTEEN) DAYS. 6 mL 3   bisoprolol (ZEBETA) 5 MG tablet TAKE 1/2 TABLET BY MOUTH EVERY DAY 90 tablet 2   clopidogrel (PLAVIX) 75 MG tablet TAKE 1 TABLET BY MOUTH EVERY DAY 90 tablet 2   empagliflozin (JARDIANCE) 10 MG TABS tablet Take 1 tablet by mouth daily. 90 tablet  3   Fluticasone-Umeclidin-Vilant (TRELEGY ELLIPTA) 100-62.5-25 MCG/INH AEPB Inhale 1 puff into the lungs daily. 60 each 11   furosemide (LASIX) 40 MG tablet TAKE 1 TABLET BY MOUTH AS NEEDED FOR FLUID OR EDEMA (Patient taking differently: Take 40 mg by mouth 2 (two) times a week. Mondays & Fridays in the morning) 90 tablet 0   Hypromellose 0.2 % SOLN Place 2 drops into both eyes 4 (four) times daily as needed (dry eyes).      lansoprazole (PREVACID) 30 MG capsule Take 30 mg by mouth in the morning.  NASAL SALINE NA Place 1 spray into the nose daily as needed (congestion).      ondansetron (ZOFRAN) 4 MG tablet Take 1 tablet (4 mg total) by mouth daily as needed for nausea or vomiting. 20 tablet 0   rosuvastatin (CRESTOR) 5 MG tablet Take 1 tablet (5 mg total) by mouth 3 (three) times a week. 36 tablet 3   sacubitril-valsartan (ENTRESTO) 24-26 MG Take 1 tablet by mouth 2 (two) times daily. Please wait to begin for 36 hours after discontinuing the Losartan. 180 tablet 3   simethicone (MYLICON) 80 MG chewable tablet Chew 2 tablets (160 mg total) by mouth every 6 (six) hours as needed for flatulence. 30 tablet 0   Spacer/Aero-Holding Chambers (AEROCHAMBER MV) inhaler Use as instructed 1 each 0   Specialty Vitamins Products ([employer]S MENOPAUSE SUPPORT PO) Take 1 capsule by mouth in the morning.     spironolactone (ALDACTONE) 25 MG tablet TAKE 1 TABLET(25 MG) BY MOUTH DAILY 90 tablet 3   nitroGLYCERIN (NITROSTAT) 0.4 MG SL tablet DISSOLVE ONE TABLET UNDER TONGUE AS NEEDED FOR CHEST PAIN EVERY 5 MINUTES (Patient not taking: Reported on 10/20/2021) 25 tablet 4   oxyCODONE (OXY IR/ROXICODONE) 5 MG immediate release tablet Take 1 tablet (5 mg total) by mouth every 6 (six) hours as needed for severe pain. (Patient not taking: Reported on 10/20/2021) 30 tablet 0   No current facility-administered medications for this encounter.    BP (!) 136/90   Pulse 74   Ht 4\' 11"  (1.499 m)   Wt 54 kg (119 lb)   LMP  10/04/1999   SpO2 96%   BMI 24.04 kg/m   Wt Readings from Last 3 Encounters:  10/20/21 54 kg (119 lb)  09/24/21 73.2 kg (161 lb 6 oz)  09/18/21 58.7 kg (129 lb 8 oz)   PHYSICAL EXAM: General:  NAD. No resp difficulty HEENT: Normal Neck: Supple. No JVD. Carotids 2+ bilat; no bruits. No lymphadenopathy or thryomegaly appreciated. Cor: PMI nondisplaced. Regular rate & rhythm. No rubs, gallops, 2/6 SEM, clear S2 Lungs: Clear Abdomen: Soft, nontender, nondistended. No hepatosplenomegaly. No bruits or masses. Good bowel sounds. Abdominal incisions intact Extremities: No cyanosis, clubbing, rash, edema Neuro: Alert & oriented x 3, cranial nerves grossly intact. Moves all 4 extremities w/o difficulty. Affect pleasant.  ECG (personally reviewed from 09/20/21): SB 50 bpm   ASSESSMENT & PLAN: 1. Chronic Systolic HF. ICM + valvular heart disease - previous ECHO with EF 40-45%.  - Echo (6/20): EF 20-25% with severe MR - Echo (1/21): EF 50% Apex hypokinetic - Echo (7/21): EF 40-45%, normal TAVR mean gradient 14 mmHg and trivial-mild PVL. 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 Mild MR. - Echo (2/23): EF 50-55% apical aneurysm. TAVR valve (Sapien) ok - NYHA II, volume looks good today. - Continue Lasix 20 mg 2x/week  - Continue spironolactone 25 mg daily.  - Continue bisoprolol 2.5 mg daily.  - Continue Entresto 24/26 mg bid. - Continue Jardiance 10 mg daily. - Labs today.   2. Severe AS - S/P TAVR on 08/26/2018  - Continue Plavix.  - Stable on echo 2/23 - Reminded of need for SBE prophylaxis.   3. Severe MR -> Mild MR - Functional. - Improved with TAVR and treatment of HF  4. CAD - Patent stents on LHC 6/20  - No s/s ischemia - Follows with Dr. Irish Lack   5. COPD  - Quit smoking 2014. - Followed by Dr. Melvyn Novas  6. H/O Lung Cancer  - s/p resection 2014.  7. Carotid bruits - Radiated from AoV - Carotid US 6/20 1-39% bilaterally  8. Hyperlipidemia -  Remains on Praulent. - Per Dr. Irish Lack and [facility].  9. PAD  - Followed by Dr. Stanford Breed - now s/p aortobifemoral bypass  Keep follow up with Dr. Haroldine [name], as scheduled.  Rafael Bihari, FNP  1:55 PM

## 2021-10-20 NOTE — Patient Instructions (Signed)
Labs done today, your results will be available in MyChart, we will contact you for abnormal readings.  Your physician recommends that you schedule a follow-up appointment in: February with Dr Haroldine Laws, **PLEASE CALL OUR OFFICE IN DECEMBER TO [place]  If you have any questions or concerns before your next appointment please send [place] a message through [place] or call our office at [phone].    TO LEAVE A MESSAGE FOR THE NURSE SELECT OPTION 2, PLEASE LEAVE A MESSAGE INCLUDING: YOUR NAME DATE OF BIRTH CALL BACK NUMBER REASON FOR CALL**this is important as we prioritize the call backs  YOU WILL RECEIVE A CALL BACK THE SAME DAY AS LONG AS YOU CALL BEFORE 4:00 PM  At the [facility], you and your health needs are our priority. As part of our continuing mission to provide you with exceptional heart care, we have created designated Provider Care Teams. These Care Teams include your primary Cardiologist (physician) and Advanced Practice Providers (APPs- Physician Assistants and Nurse Practitioners) who all work together to provide you with the care you need, when you need it.   You may see any of the following providers on your designated Care Team at your next follow up: Dr Glori Bickers Dr Haynes Kerns, NP Lyda Jester, [place] [facility] [place], [place] Audry Riles, PharmD   Please be sure to bring in all your medications bottles to every appointment.

## 2021-10-24 ENCOUNTER — Ambulatory Visit (INDEPENDENT_AMBULATORY_CARE_PROVIDER_SITE_OTHER): Payer: PPO | Admitting: Physician Assistant

## 2021-10-24 VITALS — BP 124/64 | HR 70 | Temp 97.9°F | Resp 20 | Ht 59.0 in | Wt 119.0 lb

## 2021-10-24 DIAGNOSIS — I7409 Other arterial embolism and thrombosis of abdominal aorta: Secondary | ICD-10-CM

## 2021-10-24 NOTE — Progress Notes (Signed)
POST OPERATIVE OFFICE NOTE    CC:  F/u for surgery  HPI:  This is a 71 y.o. female who is s/p aorto-bi-femoral bypass (14 x 7mm Dacron) with bilateral common femoral endarterectomy.  This was performed for a  small infrarenal abdominal aortic aneurysm with critical bilateral common iliac artery stenosis causing disabling claudication.  Symptomatic claudication that was lifestyle limiting.  A trial of medical therapy was performed.  The patient did not improve.  She is here for post operative f/u.  She states she has had no appetite with mild nausea,  and has lost 20+ lbs since surgery.  She ambulates daily and feel great over all.  She denise abdominal pain.  She is medically managed on Plavix and Crestor daily.   Allergies  Allergen Reactions   Azithromycin Shortness Of Breath   Ceclor [Cefaclor] Shortness Of Breath and Rash   Penicillins Other (See Comments)    Unknown from childhood Did it involve swelling of the face/tongue/throat, SOB, or low BP? No Did it involve sudden or severe rash/hives, skin peeling, or any reaction on the inside of your mouth or nose? No Did you need to seek medical attention at a hospital or doctor's office? Yes When did it last happen? Infancy  If all above answers are "NO", may proceed with cephalosporin use.    Septra [Sulfamethoxazole-Trimethoprim] Shortness Of Breath and Rash   Doxycycline Swelling and Other (See Comments)    Redness on the face   Lipitor [Atorvastatin] Other (See Comments)    Severe muscle aches   Morphine And Related Nausea And Vomiting and Other (See Comments)    Terrible headache   Zetia [Ezetimibe] Other (See Comments)    Severe stomach pain   Zocor [Simvastatin] Other (See Comments)    Severe muscle aches   Adhesive [Tape] Other (See Comments)    Redness and swelling - use paper tape   Vicodin [Hydrocodone-Acetaminophen] Nausea And Vomiting    Current Outpatient Medications  Medication Sig Dispense Refill   albuterol  (PROVENTIL) (2.5 MG/3ML) 0.083% nebulizer solution Take 2.5 mg by nebulization every 6 (six) hours as needed for shortness of breath or wheezing.     albuterol (VENTOLIN HFA) 108 (90 Base) MCG/ACT inhaler Inhale 2 puffs into the lungs every 6 (six) hours as needed for wheezing or shortness of breath.     Alirocumab (PRALUENT) 150 MG/ML SOAJ INJECT 1 PEN INTO THE SKIN EVERY 14 (FOURTEEN) DAYS. 6 mL 3   bisoprolol (ZEBETA) 5 MG tablet TAKE 1/2 TABLET BY MOUTH EVERY DAY 90 tablet 2   clopidogrel (PLAVIX) 75 MG tablet TAKE 1 TABLET BY MOUTH EVERY DAY 90 tablet 2   empagliflozin (JARDIANCE) 10 MG TABS tablet Take 1 tablet by mouth daily. 90 tablet 3   Fluticasone-Umeclidin-Vilant (TRELEGY ELLIPTA) 100-62.5-25 MCG/INH AEPB Inhale 1 puff into the lungs daily. 60 each 11   furosemide (LASIX) 40 MG tablet TAKE 1 TABLET BY MOUTH AS NEEDED FOR FLUID OR EDEMA (Patient taking differently: Take 40 mg by mouth 2 (two) times a week. Mondays & Fridays in the morning) 90 tablet 0   Hypromellose 0.2 % SOLN Place 2 drops into both eyes 4 (four) times daily as needed (dry eyes).      lansoprazole (PREVACID) 30 MG capsule Take 30 mg by mouth in the morning.     NASAL SALINE NA Place 1 spray into the nose daily as needed (congestion).      nitroGLYCERIN (NITROSTAT) 0.4 MG SL tablet DISSOLVE ONE TABLET  UNDER TONGUE AS NEEDED FOR CHEST PAIN EVERY 5 MINUTES 25 tablet 4   ondansetron (ZOFRAN) 4 MG tablet Take 1 tablet (4 mg total) by mouth daily as needed for nausea or vomiting. 20 tablet 0   oxyCODONE (OXY IR/ROXICODONE) 5 MG immediate release tablet Take 1 tablet (5 mg total) by mouth every 6 (six) hours as needed for severe pain. 30 tablet 0   rosuvastatin (CRESTOR) 5 MG tablet Take 1 tablet (5 mg total) by mouth 3 (three) times a week. 36 tablet 3   sacubitril-valsartan (ENTRESTO) 24-26 MG Take 1 tablet by mouth 2 (two) times daily. Please wait to begin for 36 hours after discontinuing the Losartan. 180 tablet 3    simethicone (MYLICON) 80 MG chewable tablet Chew 2 tablets (160 mg total) by mouth every 6 (six) hours as needed for flatulence. 30 tablet 0   Spacer/Aero-Holding Chambers (AEROCHAMBER MV) inhaler Use as instructed 1 each 0   Specialty Vitamins Products (CVS MENOPAUSE SUPPORT PO) Take 1 capsule by mouth in the morning.     spironolactone (ALDACTONE) 25 MG tablet TAKE 1 TABLET(25 MG) BY MOUTH DAILY 90 tablet 3   No current facility-administered medications for this visit.     ROS:  See HPI  Physical Exam:    Incision:  Abdominal and B groin incision are healed without erythema or edema Extremities:  palpable DP pulses B LE well perfused Neuro: Sensation intact and equal B LE Abdomen:  +BS, soft NTTP    Assessment/Plan:  This is a 71 y.o. female who is s/p:70 y.o. female who is s/p aorto-bi-femoral bypass (14 x 40mm Dacron) with bilateral common femoral endarterectomy.  This was performed for a  small infrarenal abdominal aortic aneurysm with critical bilateral common iliac artery stenosis causing disabling claudication.  Symptomatic claudication that was lifestyle limiting.   Palpable DP pulses B LE with healing incisions.     Continue a walking program daily, increase nutrition with Ensure TID until normal appetite  returns.  OK to drive independently now.  Return to work as of [place]. 23.  May bath as needed.  F/U @ 9 months post op with ABI.  If she has problems or concerns she will call our office.   Roxy Horseman PA-C [facility] [phone]   Clinic MD:  Stanford Breed

## 2021-10-25 ENCOUNTER — Telehealth: Payer: Self-pay

## 2021-10-25 DIAGNOSIS — H524 Presbyopia: Secondary | ICD-10-CM | POA: Diagnosis not present

## 2021-10-25 DIAGNOSIS — H5213 Myopia, bilateral: Secondary | ICD-10-CM | POA: Diagnosis not present

## 2021-10-25 DIAGNOSIS — R350 Frequency of micturition: Secondary | ICD-10-CM | POA: Diagnosis not present

## 2021-10-25 DIAGNOSIS — H52203 Unspecified astigmatism, bilateral: Secondary | ICD-10-CM | POA: Diagnosis not present

## 2021-10-25 DIAGNOSIS — H2513 Age-related nuclear cataract, bilateral: Secondary | ICD-10-CM | POA: Diagnosis not present

## 2021-10-25 NOTE — Telephone Encounter (Signed)
[name] called in stating at 1 am this am she had trouble urinating. Her [place] Nurse was there at her home and tested her urine. UA came back negative. Pt states her urine out put has returned to normal but she was advised to notify our office.Patient denies abdominal pains and or blood in urine. I advised if her sxs re-surface to call our office and we can get her in to be evaluated. Patient voiced her understanding.

## 2021-10-27 DIAGNOSIS — D692 Other nonthrombocytopenic purpura: Secondary | ICD-10-CM | POA: Diagnosis not present

## 2021-10-27 DIAGNOSIS — I70213 Atherosclerosis of native arteries of extremities with intermittent claudication, bilateral legs: Secondary | ICD-10-CM | POA: Diagnosis not present

## 2021-10-27 DIAGNOSIS — R350 Frequency of micturition: Secondary | ICD-10-CM | POA: Diagnosis not present

## 2021-10-27 DIAGNOSIS — N390 Urinary tract infection, site not specified: Secondary | ICD-10-CM | POA: Diagnosis not present

## 2021-10-27 DIAGNOSIS — Z7902 Long term (current) use of antithrombotics/antiplatelets: Secondary | ICD-10-CM | POA: Diagnosis not present

## 2021-10-30 ENCOUNTER — Other Ambulatory Visit: Payer: Self-pay

## 2021-10-30 DIAGNOSIS — I70213 Atherosclerosis of native arteries of extremities with intermittent claudication, bilateral legs: Secondary | ICD-10-CM

## 2021-11-08 DIAGNOSIS — Z09 Encounter for follow-up examination after completed treatment for conditions other than malignant neoplasm: Secondary | ICD-10-CM | POA: Diagnosis not present

## 2021-11-08 DIAGNOSIS — Z87440 Personal history of urinary (tract) infections: Secondary | ICD-10-CM | POA: Diagnosis not present

## 2021-11-10 DIAGNOSIS — T8149XA Infection following a procedure, other surgical site, initial encounter: Secondary | ICD-10-CM | POA: Diagnosis not present

## 2021-11-14 ENCOUNTER — Telehealth: Payer: Self-pay

## 2021-11-14 NOTE — Telephone Encounter (Signed)
Pt called stating that she has some drainage from her groin with dressing changes.  Reviewed pt's chart, returned call for clarification, two identifiers used. Pt states that she is washing the area well without irritating the incisional site. There is a small amount of light tan non-odorous drainage in the morning and at night. She denies any odor or any pain. The area is pink, not red. She was given symptoms to monitor for and reassured that she was doing what she needed. She was instructed to add a maxipad or pantiliner covering the gauze to keep sweat away from the area as well. Pt to call back if appt needed. Confirmed understanding.

## 2021-11-29 DIAGNOSIS — Z0289 Encounter for other administrative examinations: Secondary | ICD-10-CM | POA: Diagnosis not present

## 2021-12-15 ENCOUNTER — Telehealth (HOSPITAL_COMMUNITY): Payer: Self-pay | Admitting: Pharmacist

## 2021-12-15 DIAGNOSIS — G720 Drug-induced myopathy: Secondary | ICD-10-CM | POA: Diagnosis not present

## 2021-12-15 DIAGNOSIS — J440 Chronic obstructive pulmonary disease with acute lower respiratory infection: Secondary | ICD-10-CM | POA: Diagnosis not present

## 2021-12-15 DIAGNOSIS — K219 Gastro-esophageal reflux disease without esophagitis: Secondary | ICD-10-CM | POA: Diagnosis not present

## 2021-12-15 DIAGNOSIS — Z0000 Encounter for general adult medical examination without abnormal findings: Secondary | ICD-10-CM | POA: Diagnosis not present

## 2021-12-15 DIAGNOSIS — I2510 Atherosclerotic heart disease of native coronary artery without angina pectoris: Secondary | ICD-10-CM | POA: Diagnosis not present

## 2021-12-15 DIAGNOSIS — I5042 Chronic combined systolic (congestive) and diastolic (congestive) heart failure: Secondary | ICD-10-CM | POA: Diagnosis not present

## 2021-12-15 DIAGNOSIS — J449 Chronic obstructive pulmonary disease, unspecified: Secondary | ICD-10-CM | POA: Diagnosis not present

## 2021-12-15 DIAGNOSIS — J302 Other seasonal allergic rhinitis: Secondary | ICD-10-CM | POA: Diagnosis not present

## 2021-12-15 DIAGNOSIS — E785 Hyperlipidemia, unspecified: Secondary | ICD-10-CM | POA: Diagnosis not present

## 2021-12-15 DIAGNOSIS — I739 Peripheral vascular disease, unspecified: Secondary | ICD-10-CM | POA: Diagnosis not present

## 2021-12-15 DIAGNOSIS — Z1211 Encounter for screening for malignant neoplasm of colon: Secondary | ICD-10-CM | POA: Diagnosis not present

## 2021-12-15 DIAGNOSIS — I10 Essential (primary) hypertension: Secondary | ICD-10-CM | POA: Diagnosis not present

## 2021-12-15 NOTE — Telephone Encounter (Signed)
Patient Advocate Encounter  Was successful in securing patient an $2500.00 grant from [employer] to provide copayment coverage for [employer].  This will keep the out of pocket expense at $0.     I have spoken with the patient.    The billing information is as follows:  Member ID: [id number] Group ID: [id number] [place]: [id number] Dates of Eligibility: 12/04/21 through 12/04/22  Fund:  Hypercholesterolemia  Audry Riles, PharmD, BCPS, BCCP, CPP Heart Failure Clinic Pharmacist [phone]

## 2021-12-21 ENCOUNTER — Other Ambulatory Visit (HOSPITAL_COMMUNITY): Payer: Self-pay

## 2022-01-05 ENCOUNTER — Other Ambulatory Visit (HOSPITAL_COMMUNITY): Payer: Self-pay

## 2022-01-05 MED ORDER — ENTRESTO 24-26 MG PO TABS: 1.0000 | ORAL_TABLET | Freq: Two times a day (BID) | ORAL | 3 refills | Status: DC

## 2022-01-16 ENCOUNTER — Other Ambulatory Visit (HOSPITAL_COMMUNITY): Payer: Self-pay | Admitting: Internal Medicine

## 2022-01-22 ENCOUNTER — Other Ambulatory Visit: Payer: Self-pay | Admitting: Interventional Cardiology

## 2022-01-22 ENCOUNTER — Telehealth (HOSPITAL_COMMUNITY): Payer: Self-pay

## 2022-01-22 ENCOUNTER — Other Ambulatory Visit (HOSPITAL_COMMUNITY): Payer: Self-pay

## 2022-01-22 DIAGNOSIS — E785 Hyperlipidemia, unspecified: Secondary | ICD-10-CM

## 2022-01-22 DIAGNOSIS — I2510 Atherosclerotic heart disease of native coronary artery without angina pectoris: Secondary | ICD-10-CM

## 2022-01-22 DIAGNOSIS — I25119 Atherosclerotic heart disease of native coronary artery with unspecified angina pectoris: Secondary | ICD-10-CM

## 2022-01-22 NOTE — Telephone Encounter (Signed)
Advanced Heart Failure Patient Advocate Encounter  Received renewal notification for [employer]). This patient has already been approved for a [employer] grant that will cover the copay for this medication.  Renewal not submitted at this time, mailed a copy of grant info to the patient reaffirming that the coverage will continue, and we can renew in the 30 day window.  New rx sent to [facility] for next refill.  Clista Bernhardt, CPhT Rx Patient Advocate Phone: [phone]

## 2022-01-23 ENCOUNTER — Other Ambulatory Visit (HOSPITAL_COMMUNITY): Payer: Self-pay | Admitting: *Deleted

## 2022-01-23 ENCOUNTER — Other Ambulatory Visit (HOSPITAL_COMMUNITY): Payer: Self-pay

## 2022-01-23 MED ORDER — ENTRESTO 24-26 MG PO TABS
1.0000 | ORAL_TABLET | Freq: Two times a day (BID) | ORAL | 3 refills | Status: DC
  Filled 2022-01-23: qty 180, 90d supply, fill #0
  Filled 2022-04-18: qty 180, 90d supply, fill #1

## 2022-02-02 DIAGNOSIS — I2510 Atherosclerotic heart disease of native coronary artery without angina pectoris: Secondary | ICD-10-CM | POA: Diagnosis not present

## 2022-02-02 DIAGNOSIS — Z8679 Personal history of other diseases of the circulatory system: Secondary | ICD-10-CM | POA: Diagnosis not present

## 2022-02-02 DIAGNOSIS — Z87891 Personal history of nicotine dependence: Secondary | ICD-10-CM | POA: Diagnosis not present

## 2022-02-02 DIAGNOSIS — Z6823 Body mass index (BMI) 23.0-23.9, adult: Secondary | ICD-10-CM | POA: Diagnosis not present

## 2022-02-02 DIAGNOSIS — I700 Atherosclerosis of aorta: Secondary | ICD-10-CM | POA: Diagnosis not present

## 2022-02-02 DIAGNOSIS — Z7902 Long term (current) use of antithrombotics/antiplatelets: Secondary | ICD-10-CM | POA: Diagnosis not present

## 2022-02-02 DIAGNOSIS — Z7982 Long term (current) use of aspirin: Secondary | ICD-10-CM | POA: Diagnosis not present

## 2022-02-06 ENCOUNTER — Telehealth (HOSPITAL_COMMUNITY): Payer: Self-pay

## 2022-02-06 NOTE — Telephone Encounter (Signed)
Patient LVM on HF pharmacist line asking if it is ok to start Xyzal on current medications.   Reviewed medications and instructed patient that is ok to start Xyzal 1 tablet once daily and she can purchase this OTC. Instructed her to avoid decongestants. Patient appreciative of return call and patient had no further questions.   Joseph Art, Pharm.D. PGY-2 Ambulatory Care Pharmacy Resident 02/06/2022 4:12 PM

## 2022-02-19 DIAGNOSIS — Z6823 Body mass index (BMI) 23.0-23.9, adult: Secondary | ICD-10-CM | POA: Diagnosis not present

## 2022-02-19 DIAGNOSIS — Z1231 Encounter for screening mammogram for malignant neoplasm of breast: Secondary | ICD-10-CM | POA: Diagnosis not present

## 2022-02-19 DIAGNOSIS — Z01419 Encounter for gynecological examination (general) (routine) without abnormal findings: Secondary | ICD-10-CM | POA: Diagnosis not present

## 2022-02-28 ENCOUNTER — Telehealth: Payer: Self-pay | Admitting: *Deleted

## 2022-02-28 NOTE — Telephone Encounter (Signed)
Patient called stating she has an orange size ball in LLQ of her abdomen. She states if she lays down on her right side she can't feel it but when standing or lying flat she can feel it. She denies pain but states the area is sore. She is requesting to be seen to be evaluated since she states that she was told she could develop a hernia. She is concerned about this and would like to be seen. Patient was instructed if she develops any abdominal pain to report to ER for evaluation.scheduled patient to be seen by Dr Stanford Breed 03/06/22.

## 2022-03-05 NOTE — Progress Notes (Unsigned)
[facility] [place]  ASSESSMENT / PLAN: Teresa [name] is a 72 y.o. female status post aorto-bi-femoral bypass (14 x 74mm Dacron) on 09/20/21. She now reports a "knot" in her abdomen concerning for ventral hernia.   Recommend:  Complete cessation from all tobacco products. Blood glucose control with goal A1c < 7%. Blood pressure control with goal blood pressure < 140/90 mmHg. Lipid reduction therapy with goal LDL-C <100 mg/dL (<70 if symptomatic from PAD).  Clopidogrel 75mg  PO QD. Antilipid therapy per cardiology service.  Daily walking to and past the point of discomfort. Patient counseled to keep a log of exercise distance.  Check CT of abdomen and pelvis. Will refer to general surgery. Needs follow up with me in 3 months with ABI.  CHIEF COMPLAINT: Left leg cramping with walking  HISTORY OF PRESENT ILLNESS: Teresa [name] is a 72 y.o. female with extensive past vascular history (multiple PCI's, TAVR, congestive heart failure, etc.) who presents to clinic for evaluation of cramping discomfort in her left calf with walking.  The patient reports she can walk about the distance of 2 storefront's before severe cramping pain in her calf begins to bother her.  She has to rest for the pain to be relieved.  She has no pain at rest.  She denies any ulcers about her feet.  08/01/21: Patient returns to clinic for reevaluation.  She reports she can get about 50 yards before she has severe cramping in her left thigh and calf.  This is interfering with her ability to care for children and take care of her IADLs.  We had a long discussion about risks, benefits, and alternatives to intervention for claudication.  She desires intervention.  09/12/21: Patient returns after angiogram, CT scan, and cardiology evaluation.  She is ready to proceed with surgery.  We again reviewed the risks, benefits, and alternatives.  We again clarified her symptomology.  She reports short distance  buttock and thigh claudication which is relieved by rest.  She does not report symptoms are relieved by positional change.  She does not have symptoms when standing for long period of time.  I do think her symptoms are related to peripheral arterial disease.  03/06/22: In the clinic to evaluate for a "knot" in her abdomen.  Patient reports a lump in her left lower quadrant which is more noticeable when she stands.  It is somewhat tender.  It mostly bothers her at night when she is resting.  She does not report any obstructive symptoms.  VASCULAR SURGICAL HISTORY: none  VASCULAR RISK FACTORS: Negative history of stroke / transient ischemic attack. Positive history of coronary artery disease. + history of PCI.  Negative history of CABG.  Negative history of diabetes mellitus.  No recent A1c available to me. Positive history of smoking.  Not actively smoking. Positive history of hypertension.  Negative history of chronic kidney disease.  Last GFR >60.  Positive history of chronic obstructive pulmonary disease. Not on oxygen.  FUNCTIONAL STATUS: ECOG performance status: (1) Restricted in physically strenuous activity, ambulatory and able to do work of light nature Ambulatory status: Ambulatory within the community with limits  Past Medical History:  Diagnosis Date   AAA (abdominal aortic aneurysm) ([place])    a. small by cath 2014.   Acute on chronic combined systolic and diastolic heart failure ([place]) 08/19/2018   Arthritis    "across my hips; buttocks; comes w/the weather" (02/27/2013)   CAD (coronary artery disease), native coronary artery  a. 10/2012 - rotational atherectomy of LAD c/b dissection s/p PCI to LAD, LCx, LM. b. 12/2012 - s/p overlapping DES to RCA.   Cancer [facility])    Cataract    "just the beginnings on the right" (02/27/2013)   Chronic combined systolic and diastolic CHF (congestive heart failure) (HCC)    COPD (chronic obstructive pulmonary disease) (HCC)    Coronary artery  disease    GERD (gastroesophageal reflux disease)    Hypercholesterolemia    Ischemic cardiomyopathy    a. EF 45-50%.   Mass of lung    "small tumor RUL; they are watching it" (02/27/2013)   Mitral regurgitation    Peripheral vascular disease, unspecified (HCC)    Pneumonia    PONV (postoperative nausea and vomiting)    S/P TAVR (transcatheter aortic valve replacement)    23 mm Edwards Sapien 3 transcatheter heart valve placed via open left transaxillary approach    Severe aortic stenosis    Severe mitral regurgitation    Tachycardia, unspecified    Tobacco abuse     Past Surgical History:  Procedure Laterality Date   ABDOMINAL AORTOGRAM W/LOWER EXTREMITY N/A 08/03/2021   Procedure: ABDOMINAL AORTOGRAM W/LOWER EXTREMITY;  Surgeon: Marty Heck, MD;  Location: [place] CV LAB;  Service: Cardiovascular;  Laterality: N/A;   AORTA - BILATERAL FEMORAL ARTERY BYPASS GRAFT Bilateral 09/20/2021   Procedure: AORTA BIFEMORAL BYPASS GRAFT WITH EPIDURAL CATHETER and  aortic endartarectomey and bilateral femoral endartarectomies.;  Surgeon: [name], MD;  Location: [facility] OR;  Service: Vascular;  Laterality: Bilateral;   CARDIAC CATHETERIZATION  08/2018   CORONARY ANGIOPLASTY WITH STENT PLACEMENT  10/2012; 12/08/2012   "7 + 3" (02/27/2013)   ESOPHAGOGASTRODUODENOSCOPY (EGD) WITH PROPOFOL N/A 01/11/2015   Procedure: ESOPHAGOGASTRODUODENOSCOPY (EGD) WITH PROPOFOL;  Surgeon: Garlan Fair, MD;  Location: [facility];  Service: Endoscopy;  Laterality: N/A;   FLEXIBLE BRONCHOSCOPY N/A 03/02/2013   Procedure: FLEXIBLE BRONCHOSCOPY;  Surgeon: Gaye Pollack, MD;  Location: [place];  Service: Thoracic;  Laterality: N/A;   FLEXIBLE SIGMOIDOSCOPY N/A 01/11/2015   Procedure: FLEXIBLE SIGMOIDOSCOPY;  Surgeon: Garlan Fair, MD;  Location: [facility];  Service: Endoscopy;  Laterality: N/A;  unable to complete colon-prep issues   GANGLION CYST EXCISION Left 1975   "wrist"   LEFT HEART CATH  AND CORONARY ANGIOGRAPHY N/A 06/29/2016   Procedure: Left Heart Cath and Coronary Angiography;  Surgeon: Jettie Booze, MD;  Location: [place] CV LAB;  Service: Cardiovascular;  Laterality: N/A;   LEFT HEART CATH AND CORONARY ANGIOGRAPHY N/A 08/19/2018   Procedure: LEFT HEART CATH AND CORONARY ANGIOGRAPHY;  Surgeon: Burnell Blanks, MD;  Location: [place] CV LAB;  Service: Cardiovascular;  Laterality: N/A;   LEFT HEART CATHETERIZATION WITH CORONARY ANGIOGRAM N/A 12/08/2012   Procedure: LEFT HEART CATHETERIZATION WITH CORONARY ANGIOGRAM;  Surgeon: Jettie Booze, MD;  Location: [facility] CATH LAB;  Service: Cardiovascular;  Laterality: N/A;   MOUTH SURGERY  2010?   "for bone loss" (06/24/2012)   PERCUTANEOUS CORONARY STENT INTERVENTION (PCI-S) N/A 10/09/2012   Procedure: PERCUTANEOUS CORONARY STENT INTERVENTION (PCI-S);  Surgeon: Jettie Booze, MD;  Location: [facility] CATH LAB;  Service: Cardiovascular;  Laterality: N/A;   RIGHT HEART CATH N/A 08/19/2018   Procedure: RIGHT HEART CATH;  Surgeon: Burnell Blanks, MD;  Location: [place] CV LAB;  Service: Cardiovascular;  Laterality: N/A;   TEE WITHOUT CARDIOVERSION N/A 08/22/2018   Procedure: TRANSESOPHAGEAL ECHOCARDIOGRAM (TEE);  Surgeon: Pixie Casino, MD;  Location:  [facility] ENDOSCOPY;  Service: Cardiovascular;  Laterality: N/A;   TEE WITHOUT CARDIOVERSION N/A 08/26/2018   Procedure: TRANSESOPHAGEAL ECHOCARDIOGRAM (TEE);  Surgeon: Burnell Blanks, MD;  Location: [place];  Service: Open Heart Surgery;  Laterality: N/A;   THORACOTOMY/LOBECTOMY Right 03/02/2013   Procedure: Right Video Assisted Thoracoscopy/Thoracotomy with upper Lobectomy;  Surgeon: Gaye Pollack, MD;  Location: [facility] OR;  Service: Thoracic;  Laterality: Right;  Right Lung Upper  Lobectomy    TONSILLECTOMY  1960   VIDEO BRONCHOSCOPY  09/12/2011   Procedure: VIDEO BRONCHOSCOPY WITH FLUORO;  Surgeon: Tanda Rockers, MD;  Location: [facility];  Service:  Cardiopulmonary;  Laterality: Bilateral;    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  Tobacco Use   Smoking status: Former    Packs/day: 0.50    Years: 40.00    Total pack years: 20.00    Types: Cigarettes    Quit date: 10/08/2012    Years since quitting: 9.4   Smokeless tobacco: Never  Vaping Use   Vaping Use: Never used  Substance and Sexual Activity   Alcohol use: No   Drug use: No   Sexual activity: Yes  Other Topics Concern   Not on file  Social History Narrative   Not on file   Social Determinants of Health   Financial Resource Strain: Low Risk  (10/07/2018)   Overall Financial Resource Strain (CARDIA)    Difficulty of Paying Living Expenses: Not hard at all  Food Insecurity: No Food Insecurity (10/07/2018)   Hunger Vital Sign    Worried About Running Out of Food in the Last Year: Never true    [place] in the Last Year: Never true  Transportation Needs: No Transportation Needs (10/07/2018)   PRAPARE - Hydrologist (Medical): No    Lack of Transportation (Non-Medical): No  Physical Activity: Insufficiently Active (10/07/2018)   Exercise Vital Sign    Days of Exercise per Week: 4 days    Minutes of Exercise per Session: 30 min  Stress: No Stress Concern Present (10/07/2018)   [place]    Feeling of Stress : Only a little  Social Connections: Not on file  Intimate Partner Violence: Not on file    Allergies  Allergen Reactions   Azithromycin  Shortness Of Breath   Ceclor [Cefaclor] Shortness Of Breath and Rash   Penicillins Other (See Comments)    Unknown from childhood Did it involve swelling of the face/tongue/throat, SOB, or low BP? No Did it involve sudden or severe rash/hives, skin peeling, or any reaction on the inside of your mouth or nose? No Did you need to seek medical attention at a hospital or doctor's office? Yes When did it last happen? Infancy  If all above answers are "NO", may proceed with cephalosporin use.    Septra [Sulfamethoxazole-Trimethoprim] Shortness Of Breath and Rash   Doxycycline Swelling  and Other (See Comments)    Redness on the face   Lipitor [Atorvastatin] Other (See Comments)    Severe muscle aches   Morphine And Related Nausea And Vomiting and Other (See Comments)    Terrible headache   Zetia [Ezetimibe] Other (See Comments)    Severe stomach pain   Zocor [Simvastatin] Other (See Comments)    Severe muscle aches   Adhesive [Tape] Other (See Comments)    Redness and swelling - use paper tape   Vicodin [Hydrocodone-Acetaminophen] Nausea And Vomiting    Current Outpatient Medications  Medication Sig Dispense Refill   Alirocumab (PRALUENT) 150 MG/ML SOAJ INJECT 1 PEN INTO THE SKIN EVERY 14 (FOURTEEN) DAYS. 6 mL 3   albuterol (PROVENTIL) (2.5 MG/3ML) 0.083% nebulizer solution Take 2.5 mg by nebulization every 6 (six) hours as needed for shortness of breath or wheezing.     albuterol (VENTOLIN HFA) 108 (90 Base) MCG/ACT inhaler Inhale 2 puffs into the lungs every 6 (six) hours as needed for wheezing or shortness of breath.     bisoprolol (ZEBETA) 5 MG tablet TAKE 1/2 TABLET BY MOUTH EVERY DAY 90 tablet 2   clopidogrel (PLAVIX) 75 MG tablet TAKE 1 TABLET BY MOUTH EVERY DAY 90 tablet 2   empagliflozin (JARDIANCE) 10 MG TABS tablet Take 1 tablet by mouth daily. 90 tablet 3   Fluticasone-Umeclidin-Vilant (TRELEGY ELLIPTA) 100-62.5-25 MCG/INH AEPB Inhale 1 puff into the lungs daily. 60 each 11    furosemide (LASIX) 40 MG tablet TAKE 1 TABLET BY MOUTH AS NEEDED FOR FLUID OR EDEMA (Patient taking differently: Take 40 mg by mouth 2 (two) times a week. Mondays & Fridays in the morning) 90 tablet 0   Hypromellose 0.2 % SOLN Place 2 drops into both eyes 4 (four) times daily as needed (dry eyes).      lansoprazole (PREVACID) 30 MG capsule Take 30 mg by mouth in the morning.     NASAL SALINE NA Place 1 spray into the nose daily as needed (congestion).      nitroGLYCERIN (NITROSTAT) 0.4 MG SL tablet DISSOLVE ONE TABLET UNDER TONGUE AS NEEDED FOR CHEST PAIN EVERY 5 MINUTES 25 tablet 4   ondansetron (ZOFRAN) 4 MG tablet Take 1 tablet (4 mg total) by mouth daily as needed for nausea or vomiting. 20 tablet 0   oxyCODONE (OXY IR/ROXICODONE) 5 MG immediate release tablet Take 1 tablet (5 mg total) by mouth every 6 (six) hours as needed for severe pain. 30 tablet 0   rosuvastatin (CRESTOR) 5 MG tablet Take 1 tablet (5 mg total) by mouth 3 (three) times a week. 36 tablet 3   sacubitril-valsartan (ENTRESTO) 24-26 MG Take 1 tablet by mouth 2 (two) times daily. Please wait to begin for 36 hours after discontinuing the Losartan. 180 tablet 3   simethicone (MYLICON) 80 MG chewable tablet Chew 2 tablets (160 mg total) by mouth every 6 (six) hours as needed for flatulence. 30 tablet 0   Spacer/Aero-Holding Chambers (AEROCHAMBER MV) inhaler Use as instructed 1 each 0   Specialty Vitamins Products ([employer] MENOPAUSE SUPPORT PO) Take 1 capsule by mouth in the morning.     spironolactone (ALDACTONE) 25 MG tablet TAKE 1 TABLET(25 MG) BY MOUTH DAILY 90 tablet 3   No current facility-administered medications for this visit.    PHYSICAL EXAM There were no vitals filed for this visit.  Well-appearing woman in no acute distress Regular rate and rhythm Unlabored breathing Warm well-perfused feet No hernia about the left lower quadrant.  May involve the incision line.  PERTINENT LABORATORY AND RADIOLOGIC DATA  Most  recent CBC    Latest Ref Rng & Units 10/20/2021    2:08 PM 09/25/2021    5:00 AM 09/24/2021   12:21 AM  CBC  WBC 4.0 - 10.5 K/uL 7.0  4.8  5.7   Hemoglobin 12.0 - 15.0 g/dL 12.1  10.1  9.6   Hematocrit 36.0 - 46.0 % 40.0  30.3  29.6   Platelets 150 - 400 K/uL 344  135  103      Most recent CMP    Latest Ref Rng & Units 10/20/2021    2:08 PM 09/25/2021    5:00 AM 09/24/2021   12:21 AM  CMP  Glucose 70 - 99 mg/dL 100  96  97   BUN 8 - 23 mg/dL 11  12  11    Creatinine 0.44 - 1.00 mg/dL 0.89  0.80  0.66   Sodium 135 - 145 mmol/L 138  139  136   Potassium 3.5 - 5.1 mmol/L 3.8  4.6  4.0   Chloride 98 - 111 mmol/L 105  105  105   CO2 22 - 32 mmol/L 21  28  24    Calcium 8.9 - 10.3 mg/dL 9.0  8.1  7.6    Hgb A1c MFr Bld (%)  Date Value  09/20/2021 5.5    LDL Chol Calc (NIH)  Date Value Ref Range Status  09/06/2021 101 (H) 0 - 99 mg/dL Final   Yevonne Aline. Stanford Breed, MD FACS [facility] Phone Number: [phone] 03/06/2022 10:33 AM  Total time spent on preparing this encounter including chart review, data review, collecting history, examining the patient, coordinating care for this established patient, 40 minutes.  Portions of this report may have been transcribed using voice recognition software.  Every effort has been made to ensure accuracy; however, inadvertent computerized transcription errors may still be present.

## 2022-03-06 ENCOUNTER — Ambulatory Visit (INDEPENDENT_AMBULATORY_CARE_PROVIDER_SITE_OTHER): Payer: PPO | Admitting: Vascular Surgery

## 2022-03-06 ENCOUNTER — Encounter: Payer: Self-pay | Admitting: Vascular Surgery

## 2022-03-06 VITALS — BP 116/57 | HR 77 | Temp 98.0°F | Resp 20 | Ht 59.0 in | Wt 118.0 lb

## 2022-03-06 DIAGNOSIS — K439 Ventral hernia without obstruction or gangrene: Secondary | ICD-10-CM

## 2022-03-07 ENCOUNTER — Other Ambulatory Visit: Payer: Self-pay

## 2022-03-07 DIAGNOSIS — K439 Ventral hernia without obstruction or gangrene: Secondary | ICD-10-CM

## 2022-03-21 ENCOUNTER — Other Ambulatory Visit (HOSPITAL_COMMUNITY): Payer: Self-pay

## 2022-03-23 ENCOUNTER — Ambulatory Visit (HOSPITAL_COMMUNITY)
Admission: RE | Admit: 2022-03-23 | Discharge: 2022-03-23 | Disposition: A | Discharge status: Home / Self Care | Payer: PPO | Source: Ambulatory Visit | Attending: Vascular Surgery | Admitting: Vascular Surgery

## 2022-03-23 DIAGNOSIS — K439 Ventral hernia without obstruction or gangrene: Secondary | ICD-10-CM

## 2022-03-23 DIAGNOSIS — I700 Atherosclerosis of aorta: Secondary | ICD-10-CM | POA: Diagnosis not present

## 2022-03-23 MED ORDER — IOHEXOL 350 MG/ML SOLN
75.0000 mL | Freq: Once | INTRAVENOUS | Status: AC | PRN
  Administered 2022-03-23: 75 mL via INTRAVENOUS

## 2022-03-30 DIAGNOSIS — I739 Peripheral vascular disease, unspecified: Secondary | ICD-10-CM | POA: Diagnosis not present

## 2022-03-30 DIAGNOSIS — I7409 Other arterial embolism and thrombosis of abdominal aorta: Secondary | ICD-10-CM | POA: Diagnosis not present

## 2022-03-30 DIAGNOSIS — I255 Ischemic cardiomyopathy: Secondary | ICD-10-CM | POA: Diagnosis not present

## 2022-03-30 DIAGNOSIS — K432 Incisional hernia without obstruction or gangrene: Secondary | ICD-10-CM | POA: Diagnosis not present

## 2022-04-03 ENCOUNTER — Other Ambulatory Visit: Payer: Self-pay

## 2022-04-03 ENCOUNTER — Encounter: Payer: Self-pay | Admitting: Vascular Surgery

## 2022-04-03 ENCOUNTER — Ambulatory Visit (INDEPENDENT_AMBULATORY_CARE_PROVIDER_SITE_OTHER): Payer: PPO | Admitting: Vascular Surgery

## 2022-04-03 VITALS — BP 121/60 | HR 73 | Temp 97.9°F | Resp 20 | Ht 59.0 in | Wt 119.0 lb

## 2022-04-03 DIAGNOSIS — I739 Peripheral vascular disease, unspecified: Secondary | ICD-10-CM

## 2022-04-03 NOTE — H&P (View-Only) (Signed)
[facility] [place]  ASSESSMENT / PLAN: Teresa [name] is a 72 y.o. female status post aorto-bi-femoral bypass (14 x 74mm Dacron) on 09/20/21. Recent CT scan shows ventral hernia. She has already seen Dr. Kieth Brightly with plans for elective repair. Incidental on her CT scan is significant stenosis about the proximal anastomosis of the aortobifemoral bypass graft.   Recommend:  Complete cessation from all tobacco products. Blood glucose control with goal A1c < 7%. Blood pressure control with goal blood pressure < 140/90 mmHg. Lipid reduction therapy with goal LDL-C <100 mg/dL (<70 if symptomatic from PAD).  Clopidogrel 75mg  PO QD. Antilipid therapy per cardiology service.  Daily walking to and past the point of discomfort. Patient counseled to keep a log of exercise distance.  Plan aortogram with placement of balloon expandable stent to correct proximal anastomosis and extend patency.   CHIEF COMPLAINT: Left leg cramping with walking  HISTORY OF PRESENT ILLNESS: Teresa [name] is a 72 y.o. female with extensive past vascular history (multiple PCI's, TAVR, congestive heart failure, etc.) who presents to clinic for evaluation of cramping discomfort in her left calf with walking.  The patient reports she can walk about the distance of 2 storefront's before severe cramping pain in her calf begins to bother her.  She has to rest for the pain to be relieved.  She has no pain at rest.  She denies any ulcers about her feet.  08/01/21: Patient returns to clinic for reevaluation.  She reports she can get about 50 yards before she has severe cramping in her left thigh and calf.  This is interfering with her ability to care for children and take care of her IADLs.  We had a long discussion about risks, benefits, and alternatives to intervention for claudication.  She desires intervention.  09/12/21: Patient returns after angiogram, CT scan, and cardiology evaluation.  She is ready to  proceed with surgery.  We again reviewed the risks, benefits, and alternatives.  We again clarified her symptomology.  She reports short distance buttock and thigh claudication which is relieved by rest.  She does not report symptoms are relieved by positional change.  She does not have symptoms when standing for long period of time.  I do think her symptoms are related to peripheral arterial disease.  03/06/22: In the clinic to evaluate for a "knot" in her abdomen.  Patient reports a lump in her left lower quadrant which is more noticeable when she stands.  It is somewhat tender.  It mostly bothers her at night when she is resting.  She does not report any obstructive symptoms.  04/03/22: patient returns to clinic to discuss CT scan. Ventral hernia confirmed. She has seen Dr. Kieth Brightly and is planning on fixing this with open ventral hernia repair. Incidental is proximal anastomotic stenosis. Patient reports no claudication, rest pain, or ischemic ulceration.  VASCULAR SURGICAL HISTORY: none  VASCULAR RISK FACTORS: Negative history of stroke / transient ischemic attack. Positive history of coronary artery disease. + history of PCI.  Negative history of CABG.  Negative history of diabetes mellitus.  No recent A1c available to me. Positive history of smoking.  Not actively smoking. Positive history of hypertension.  Negative history of chronic kidney disease.  Last GFR >60.  Positive history of chronic obstructive pulmonary disease. Not on oxygen.  FUNCTIONAL STATUS: ECOG performance status: (1) Restricted in physically strenuous activity, ambulatory and able to do work of light nature Ambulatory status: Ambulatory within the community with  limits  Past Medical History:  Diagnosis Date   AAA (abdominal aortic aneurysm) ([place])    a. small by cath 2014.   Acute on chronic combined systolic and diastolic heart failure ([place]) 08/19/2018   Arthritis    "across my hips; buttocks; comes w/the weather"  (02/27/2013)   CAD (coronary artery disease), native coronary artery    a. 10/2012 - rotational atherectomy of LAD c/b dissection s/p PCI to LAD, LCx, LM. b. 12/2012 - s/p overlapping DES to RCA.   Cancer [facility])    Cataract    "just the beginnings on the right" (02/27/2013)   Chronic combined systolic and diastolic CHF (congestive heart failure) (H[facility])    COPD (chronic obstructive pulmonary disease) (H[facility])    Coronary artery disease    GERD (gastroesophageal reflux disease)    Hypercholesterolemia    Ischemic cardiomyopathy    a. EF 45-50%.   Mass of lung    "small tumor RUL; they are watching it" (02/27/2013)   Mitral regurgitation    Peripheral vascular disease, unspecified (HCC)    Pneumonia    PONV (postoperative nausea and vomiting)    S/P TAVR (transcatheter aortic valve replacement)    23 mm Edwards Sapien 3 transcatheter heart valve placed via open left transaxillary approach    Severe aortic stenosis    Severe mitral regurgitation    Tachycardia, unspecified    Tobacco abuse     Past Surgical History:  Procedure Laterality Date   ABDOMINAL AORTOGRAM W/LOWER EXTREMITY N/A 08/03/2021   Procedure: ABDOMINAL AORTOGRAM W/LOWER EXTREMITY;  Surgeon: Marty Heck, MD;  Location: [place] CV LAB;  Service: Cardiovascular;  Laterality: N/A;   AORTA - BILATERAL FEMORAL ARTERY BYPASS GRAFT Bilateral 09/20/2021   Procedure: AORTA BIFEMORAL BYPASS GRAFT WITH EPIDURAL CATHETER and  aortic endartarectomey and bilateral femoral endartarectomies.;  Surgeon: [name], MD;  Location: [facility] OR;  Service: Vascular;  Laterality: Bilateral;   CARDIAC CATHETERIZATION  08/2018   CORONARY ANGIOPLASTY WITH STENT PLACEMENT  10/2012; 12/08/2012   "7 + 3" (02/27/2013)   ESOPHAGOGASTRODUODENOSCOPY (EGD) WITH PROPOFOL N/A 01/11/2015   Procedure: ESOPHAGOGASTRODUODENOSCOPY (EGD) WITH PROPOFOL;  Surgeon: Garlan Fair, MD;  Location: [facility] ENDOSCOPY;  Service: Endoscopy;  Laterality: N/A;    FLEXIBLE BRONCHOSCOPY N/A 03/02/2013   Procedure: FLEXIBLE BRONCHOSCOPY;  Surgeon: Gaye Pollack, MD;  Location: [place];  Service: Thoracic;  Laterality: N/A;   FLEXIBLE SIGMOIDOSCOPY N/A 01/11/2015   Procedure: FLEXIBLE SIGMOIDOSCOPY;  Surgeon: Garlan Fair, MD;  Location: [facility] ENDOSCOPY;  Service: Endoscopy;  Laterality: N/A;  unable to complete colon-prep issues   GANGLION CYST EXCISION Left 1975   "wrist"   LEFT HEART CATH AND CORONARY ANGIOGRAPHY N/A 06/29/2016   Procedure: Left Heart Cath and Coronary Angiography;  Surgeon: Jettie Booze, MD;  Location: [place] CV LAB;  Service: Cardiovascular;  Laterality: N/A;   LEFT HEART CATH AND CORONARY ANGIOGRAPHY N/A 08/19/2018   Procedure: LEFT HEART CATH AND CORONARY ANGIOGRAPHY;  Surgeon: Burnell Blanks, MD;  Location: [place] CV LAB;  Service: Cardiovascular;  Laterality: N/A;   LEFT HEART CATHETERIZATION WITH CORONARY ANGIOGRAM N/A 12/08/2012   Procedure: LEFT HEART CATHETERIZATION WITH CORONARY ANGIOGRAM;  Surgeon: Jettie Booze, MD;  Location: [facility] CATH LAB;  Service: Cardiovascular;  Laterality: N/A;   MOUTH SURGERY  2010?   "for bone loss" (06/24/2012)   PERCUTANEOUS CORONARY STENT INTERVENTION (PCI-S) N/A 10/09/2012   Procedure: PERCUTANEOUS CORONARY STENT INTERVENTION (PCI-S);  Surgeon: Jettie Booze, MD;  Location: [place] CATH LAB;  Service: Cardiovascular;  Laterality: N/A;   RIGHT HEART CATH N/A 08/19/2018   Procedure: RIGHT HEART CATH;  Surgeon: Burnell Blanks, MD;  Location: [place] CV LAB;  Service: Cardiovascular;  Laterality: N/A;   TEE WITHOUT CARDIOVERSION N/A 08/22/2018   Procedure: TRANSESOPHAGEAL ECHOCARDIOGRAM (TEE);  Surgeon: Pixie Casino, MD;  Location: [facility] ENDOSCOPY;  Service: Cardiovascular;  Laterality: N/A;   TEE WITHOUT CARDIOVERSION N/A 08/26/2018   Procedure: TRANSESOPHAGEAL ECHOCARDIOGRAM (TEE);  Surgeon: Burnell Blanks, MD;  Location: [place];  Service: Open Heart  Surgery;  Laterality: N/A;   THORACOTOMY/LOBECTOMY Right 03/02/2013   Procedure: Right Video Assisted Thoracoscopy/Thoracotomy with upper Lobectomy;  Surgeon: Gaye Pollack, MD;  Location[facility] OR;  Service: Thoracic;  Laterality: Right;  Right Lung Upper  Lobectomy    TONSILLECTOMY  1960   VIDEO BRONCHOSCOPY  09/12/2011   Procedure: VIDEO BRONCHOSCOPY WITH FLUORO;  Surgeon: Tanda Rockers, MD;  Location: [facility] ENDOSCOPY;  Service: Cardiopulmonary;  Laterality: Bilateral;    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  Tobacco Use   Smoking status: Former    Packs/day: 0.50    Years: 40.00    Total pack years: 20.00    Types: Cigarettes    Quit date: 10/08/2012    Years since quitting: 9.4   Smokeless tobacco: Never  Vaping Use   Vaping Use: Never used  Substance and Sexual Activity   Alcohol use: No   Drug use: No   Sexual activity: Yes  Other Topics Concern   Not on file  Social History Narrative   Not on file   Social Determinants of Health   Financial Resource Strain: Low Risk  (10/07/2018)   Overall Financial Resource Strain (CARDIA)    Difficulty of Paying Living Expenses: Not hard at all  Food Insecurity: No Food Insecurity (10/07/2018)   Hunger Vital Sign    Worried About Running Out of Food in the Last Year: Never true    [place] in the Last Year: Never true  Transportation Needs: No Transportation Needs (10/07/2018)   PRAPARE - Hydrologist (Medical): No    Lack of Transportation (Non-Medical): No  Physical Activity:  Insufficiently Active (10/07/2018)   Exercise Vital Sign    Days of Exercise per Week: 4 days    Minutes of Exercise per Session: 30 min  Stress: No Stress Concern Present (10/07/2018)   [place]    Feeling of Stress : Only a little  Social Connections: Not on file  Intimate Partner Violence: Not on file    Allergies  Allergen Reactions   Azithromycin Shortness Of Breath   Ceclor [Cefaclor] Shortness Of Breath and Rash   Penicillins Other (See Comments)    Unknown from childhood Did it involve swelling of the face/tongue/throat, SOB, or low BP? No Did it involve sudden or  severe rash/hives, skin peeling, or any reaction on the inside of your mouth or nose? No Did you need to seek medical attention at a hospital or doctor's office? Yes When did it last happen? Infancy  If all above answers are "NO", may proceed with cephalosporin use.    Septra [Sulfamethoxazole-Trimethoprim] Shortness Of Breath and Rash   Doxycycline Swelling and Other (See Comments)    Redness on the face   Lipitor [Atorvastatin] Other (See Comments)    Severe muscle aches   Morphine And Related Nausea And Vomiting and Other (See Comments)    Terrible headache   Zetia [Ezetimibe] Other (See Comments)    Severe stomach pain   Zocor [Simvastatin] Other (See Comments)    Severe muscle aches   Adhesive [Tape] Other (See Comments)    Redness and swelling - use paper tape   Vicodin [Hydrocodone-Acetaminophen] Nausea And Vomiting    Current Outpatient Medications  Medication Sig Dispense Refill   albuterol (PROVENTIL) (2.5 MG/3ML) 0.083% nebulizer solution Take 2.5 mg by nebulization every 6 (six) hours as needed for shortness of breath or wheezing.     albuterol (VENTOLIN HFA) 108 (90 Base) MCG/ACT inhaler Inhale 2 puffs into the lungs every 6 (six) hours as needed for wheezing or shortness of breath.     Alirocumab (PRALUENT) 150 MG/ML SOAJ INJECT  1 PEN INTO THE SKIN EVERY 14 (FOURTEEN) DAYS. 6 mL 3   bisoprolol (ZEBETA) 5 MG tablet TAKE 1/2 TABLET BY MOUTH EVERY DAY 90 tablet 2   clopidogrel (PLAVIX) 75 MG tablet TAKE 1 TABLET BY MOUTH EVERY DAY 90 tablet 2   empagliflozin (JARDIANCE) 10 MG TABS tablet Take 1 tablet by mouth daily. 90 tablet 3   Fluticasone-Umeclidin-Vilant (TRELEGY ELLIPTA) 100-62.5-25 MCG/INH AEPB Inhale 1 puff into the lungs daily. 60 each 11   furosemide (LASIX) 40 MG tablet TAKE 1 TABLET BY MOUTH AS NEEDED FOR FLUID OR EDEMA (Patient taking differently: Take 40 mg by mouth 2 (two) times a week. Mondays & Fridays in the morning) 90 tablet 0   Hypromellose 0.2 % SOLN Place 2 drops into both eyes 4 (four) times daily as needed (dry eyes).      lansoprazole (PREVACID) 30 MG capsule Take 30 mg by mouth in the morning.     NASAL SALINE NA Place 1 spray into the nose daily as needed (congestion).      nitroGLYCERIN (NITROSTAT) 0.4 MG SL tablet DISSOLVE ONE TABLET UNDER TONGUE AS NEEDED FOR CHEST PAIN EVERY 5 MINUTES 25 tablet 4   rosuvastatin (CRESTOR) 5 MG tablet Take 1 tablet (5 mg total) by mouth 3 (three) times a week. 36 tablet 3   sacubitril-valsartan (ENTRESTO) 24-26 MG Take 1 tablet by mouth 2 (two) times daily. Please wait to begin for 36 hours after discontinuing the Losartan. 180 tablet 3   simethicone (MYLICON) 80 MG chewable tablet Chew 2 tablets (160 mg total) by mouth every 6 (six) hours as needed for flatulence. 30 tablet 0   Spacer/Aero-Holding Chambers (AEROCHAMBER MV) inhaler Use as instructed 1 each 0   Specialty Vitamins Products ([employer] MENOPAUSE SUPPORT PO) Take 1 capsule by mouth in the morning.     spironolactone (ALDACTONE) 25 MG tablet TAKE 1 TABLET(25 MG) BY MOUTH DAILY 90 tablet 3   No current facility-administered medications for this visit.    PHYSICAL EXAM Vitals:   04/03/22 1003  BP: 121/60  Pulse: 73  Resp: 20  Temp: 97.9 F (36.6 C)  SpO2: 96%  Weight: 119 lb (54 kg)  Height: 4\' 11"   (1.499 m)    Well-appearing woman in no acute distress Regular rate and rhythm Unlabored breathing Warm well-perfused feet  PERTINENT LABORATORY AND RADIOLOGIC DATA  Most recent CBC    Latest Ref Rng & Units 10/20/2021    2:08 PM 09/25/2021    5:00 AM 09/24/2021   12:21 AM  CBC  WBC 4.0 - 10.5 K/uL 7.0  4.8  5.7   Hemoglobin 12.0 - 15.0 g/dL 12.1  10.1  9.6   Hematocrit 36.0 - 46.0 % 40.0  30.3  29.6   Platelets 150 - 400 K/uL 344  135  103      Most recent CMP    Latest Ref Rng & Units 10/20/2021    2:08 PM 09/25/2021    5:00 AM 09/24/2021   12:21 AM  CMP  Glucose 70 - 99 mg/dL 100  96  97   BUN 8 - 23 mg/dL 11  12  11    Creatinine 0.44 - 1.00 mg/dL 0.89  0.80  0.66   Sodium 135 - 145 mmol/L 138  139  136   Potassium 3.5 - 5.1 mmol/L 3.8  4.6  4.0   Chloride 98 - 111 mmol/L 105  105  105   CO2 22 - 32 mmol/L 21  28  24    Calcium 8.9 - 10.3 mg/dL 9.0  8.1  7.6    Hgb A1c MFr Bld (%)  Date Value  09/20/2021 5.5    LDL Chol Calc (NIH)  Date Value Ref Range Status  09/06/2021 101 (H) 0 - 99 mg/dL Final   CT angiogram reviewed in detail. Ventral hernia noted. Proximal ABF stenosis noted. No other technical challenges noted in the bypass.  Yevonne Aline. Stanford Breed, MD [facility] Phone Number: [phone] 04/03/2022 2:58 PM  Total time spent on preparing this encounter including chart review, data review, collecting history, examining the patient, coordinating care for this established patient, 40 minutes.  Portions of this report may have been transcribed using voice recognition software.  Every effort has been made to ensure accuracy; however, inadvertent computerized transcription errors may still be present.

## 2022-04-03 NOTE — Progress Notes (Signed)
VASCULAR AND VEIN SPECIALISTS OF [place]  ASSESSMENT / PLAN: Teresa [name] is a 72 y.o. female status post aorto-bi-femoral bypass (14 x 72mm Dacron) on 09/20/21. Recent CT scan shows ventral hernia. She has already seen Dr. Kieth Brightly with plans for elective repair. Incidental on her CT scan is significant stenosis about the proximal anastomosis of the aortobifemoral bypass graft.   Recommend:  Complete cessation from all tobacco products. Blood glucose control with goal A1c < 7%. Blood pressure control with goal blood pressure < 140/90 mmHg. Lipid reduction therapy with goal LDL-C <100 mg/dL (<70 if symptomatic from PAD).  Clopidogrel 75mg  PO QD. Antilipid therapy per cardiology service.  Daily walking to and past the point of discomfort. Patient counseled to keep a log of exercise distance.  Plan aortogram with placement of balloon expandable stent to correct proximal anastomosis and extend patency.   CHIEF COMPLAINT: Left leg cramping with walking  HISTORY OF PRESENT ILLNESS: Teresa [name] is a 72 y.o. female with extensive past vascular history (multiple PCI's, TAVR, congestive heart failure, etc.) who presents to clinic for evaluation of cramping discomfort in her left calf with walking.  The patient reports she can walk about the distance of 2 storefront's before severe cramping pain in her calf begins to bother her.  She has to rest for the pain to be relieved.  She has no pain at rest.  She denies any ulcers about her feet.  08/01/21: Patient returns to clinic for reevaluation.  She reports she can get about 50 yards before she has severe cramping in her left thigh and calf.  This is interfering with her ability to care for children and take care of her IADLs.  We had a long discussion about risks, benefits, and alternatives to intervention for claudication.  She desires intervention.  09/12/21: Patient returns after angiogram, CT scan, and cardiology evaluation.  She is ready to  proceed with surgery.  We again reviewed the risks, benefits, and alternatives.  We again clarified her symptomology.  She reports short distance buttock and thigh claudication which is relieved by rest.  She does not report symptoms are relieved by positional change.  She does not have symptoms when standing for long period of time.  I do think her symptoms are related to peripheral arterial disease.  03/06/22: In the clinic to evaluate for a "knot" in her abdomen.  Patient reports a lump in her left lower quadrant which is more noticeable when she stands.  It is somewhat tender.  It mostly bothers her at night when she is resting.  She does not report any obstructive symptoms.  04/03/22: patient returns to clinic to discuss CT scan. Ventral hernia confirmed. She has seen Dr. Kieth Brightly and is planning on fixing this with open ventral hernia repair. Incidental is proximal anastomotic stenosis. Patient reports no claudication, rest pain, or ischemic ulceration.  VASCULAR SURGICAL HISTORY: none  VASCULAR RISK FACTORS: Negative history of stroke / transient ischemic attack. Positive history of coronary artery disease. + history of PCI.  Negative history of CABG.  Negative history of diabetes mellitus.  No recent A1c available to me. Positive history of smoking.  Not actively smoking. Positive history of hypertension.  Negative history of chronic kidney disease.  Last GFR >60.  Positive history of chronic obstructive pulmonary disease. Not on oxygen.  FUNCTIONAL STATUS: ECOG performance status: (1) Restricted in physically strenuous activity, ambulatory and able to do work of light nature Ambulatory status: Ambulatory within the community with  limits  Past Medical History:  Diagnosis Date   AAA (abdominal aortic aneurysm) ([place])    a. small by cath 2014.   Acute on chronic combined systolic and diastolic heart failure ([place]) 08/19/2018   Arthritis    "across my hips; buttocks; comes w/the weather"  (02/27/2013)   CAD (coronary artery disease), native coronary artery    a. 10/2012 - rotational atherectomy of LAD c/b dissection s/p PCI to LAD, LCx, LM. b. 12/2012 - s/p overlapping DES to RCA.   Cancer [facility])    Cataract    "just the beginnings on the right" (02/27/2013)   Chronic combined systolic and diastolic CHF (congestive heart failure) (HCC)    COPD (chronic obstructive pulmonary disease) (HCC)    Coronary artery disease    GERD (gastroesophageal reflux disease)    Hypercholesterolemia    Ischemic cardiomyopathy    a. EF 45-50%.   Mass of lung    "small tumor RUL; they are watching it" (02/27/2013)   Mitral regurgitation    Peripheral vascular disease, unspecified (HCC)    Pneumonia    PONV (postoperative nausea and vomiting)    S/P TAVR (transcatheter aortic valve replacement)    23 mm Edwards Sapien 3 transcatheter heart valve placed via open left transaxillary approach    Severe aortic stenosis    Severe mitral regurgitation    Tachycardia, unspecified    Tobacco abuse     Past Surgical History:  Procedure Laterality Date   ABDOMINAL AORTOGRAM W/LOWER EXTREMITY N/A 08/03/2021   Procedure: ABDOMINAL AORTOGRAM W/LOWER EXTREMITY;  Surgeon: Marty Heck, MD;  Location: [place] CV LAB;  Service: Cardiovascular;  Laterality: N/A;   AORTA - BILATERAL FEMORAL ARTERY BYPASS GRAFT Bilateral 09/20/2021   Procedure: AORTA BIFEMORAL BYPASS GRAFT WITH EPIDURAL CATHETER and  aortic endartarectomey and bilateral femoral endartarectomies.;  Surgeon: [name], MD;  Location: [facility] OR;  Service: Vascular;  Laterality: Bilateral;   CARDIAC CATHETERIZATION  08/2018   CORONARY ANGIOPLASTY WITH STENT PLACEMENT  10/2012; 12/08/2012   "7 + 3" (02/27/2013)   ESOPHAGOGASTRODUODENOSCOPY (EGD) WITH PROPOFOL N/A 01/11/2015   Procedure: ESOPHAGOGASTRODUODENOSCOPY (EGD) WITH PROPOFOL;  Surgeon: Garlan Fair, MD;  Location: [facility]OS[facility];  Service: Endoscopy;  Laterality: N/A;    FLEXIBLE BRONCHOSCOPY N/A 03/02/2013   Procedure: FLEXIBLE BRONCHOSCOPY;  Surgeon: Gaye Pollack, MD;  Location: [place];  Service: Thoracic;  Laterality: N/A;   FLEXIBLE SIGMOIDOSCOPY N/A 01/11/2015   Procedure: FLEXIBLE SIGMOIDOSCOPY;  Surgeon: Garlan Fair, MD;  Location: [facility] ENDOSCOPY;  Service: Endoscopy;  Laterality: N/A;  unable to complete colon-prep issues   GANGLION CYST EXCISION Left 1975   "wrist"   LEFT HEART CATH AND CORONARY ANGIOGRAPHY N/A 06/29/2016   Procedure: Left Heart Cath and Coronary Angiography;  Surgeon: Jettie Booze, MD;  Location: [place] CV LAB;  Service: Cardiovascular;  Laterality: N/A;   LEFT HEART CATH AND CORONARY ANGIOGRAPHY N/A 08/19/2018   Procedure: LEFT HEART CATH AND CORONARY ANGIOGRAPHY;  Surgeon: Burnell Blanks, MD;  Location: [place] [facility];  Service: Cardiovascular;  Laterality: N/A;   LEFT HEART CATHETERIZATION WITH CORONARY ANGIOGRAM N/A 12/08/2012   Procedure: LEFT HEART CATHETERIZATION WITH CORONARY ANGIOGRAM;  Surgeon: Jettie Booze, MD;  Location: [facility] CATH LAB;  Service: Cardiovascular;  Laterality: N/A;   MOUTH SURGERY  2010?   "for bone loss" (06/24/2012)   PERCUTANEOUS CORONARY STENT INTERVENTION (PCI-S) N/A 10/09/2012   Procedure: PERCUTANEOUS CORONARY STENT INTERVENTION (PCI-S);  Surgeon: Jettie Booze, MD;  Location: [place] CATH LAB;  Service: Cardiovascular;  Laterality: N/A;   RIGHT HEART CATH N/A 08/19/2018   Procedure: RIGHT HEART CATH;  Surgeon: Burnell Blanks, MD;  Location: [place] [facility] LAB;  Service: Cardiovascular;  Laterality: N/A;   TEE WITHOUT CARDIOVERSION N/A 08/22/2018   Procedure: TRANSESOPHAGEAL ECHOCARDIOGRAM (TEE);  Surgeon: Pixie Casino, MD;  Location: [facility] ENDOSCOPY;  Service: Cardiovascular;  Laterality: N/A;   TEE WITHOUT CARDIOVERSION N/A 08/26/2018   Procedure: TRANSESOPHAGEAL ECHOCARDIOGRAM (TEE);  Surgeon: Burnell Blanks, MD;  Location: [place];  Service: Open Heart  Surgery;  Laterality: N/A;   THORACOTOMY/LOBECTOMY Right 03/02/2013   Procedure: Right Video Assisted Thoracoscopy/Thoracotomy with upper Lobectomy;  Surgeon: Gaye Pollack, MD;  Location: [facility] OR;  Service: Thoracic;  Laterality: Right;  Right Lung Upper  Lobectomy    TONSILLECTOMY  1960   VIDEO BRONCHOSCOPY  09/12/2011   Procedure: VIDEO BRONCHOSCOPY WITH FLUORO;  Surgeon: Tanda Rockers, MD;  Location: [facility] ENDOSCOPY;  Service: Cardiopulmonary;  Laterality: Bilateral;    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  Tobacco Use   Smoking status: Former    Packs/day: 0.50    Years: 40.00    Total pack years: 20.00    Types: Cigarettes    Quit date: 10/08/2012    Years since quitting: 9.4   Smokeless tobacco: Never  Vaping Use   Vaping Use: Never used  Substance and Sexual Activity   Alcohol use: No   Drug use: No   Sexual activity: Yes  Other Topics Concern   Not on file  Social History Narrative   Not on file   Social Determinants of Health   Financial Resource Strain: Low Risk  (10/07/2018)   Overall Financial Resource Strain (CARDIA)    Difficulty of Paying Living Expenses: Not hard at all  Food Insecurity: No Food Insecurity (10/07/2018)   Hunger Vital Sign    Worried About Running Out of Food in the Last Year: Never true    [place] in the Last Year: Never true  Transportation Needs: No Transportation Needs (10/07/2018)   PRAPARE - Hydrologist (Medical): No    Lack of Transportation (Non-Medical): No  Physical Activity:  Insufficiently Active (10/07/2018)   Exercise Vital Sign    Days of Exercise per Week: 4 days    Minutes of Exercise per Session: 30 min  Stress: No Stress Concern Present (10/07/2018)   [place]    Feeling of Stress : Only a little  Social Connections: Not on file  Intimate Partner Violence: Not on file    Allergies  Allergen Reactions   Azithromycin Shortness Of Breath   Ceclor [Cefaclor] Shortness Of Breath and Rash   Penicillins Other (See Comments)    Unknown from childhood Did it involve swelling of the face/tongue/throat, SOB, or low BP? No Did it involve sudden or  severe rash/hives, skin peeling, or any reaction on the inside of your mouth or nose? No Did you need to seek medical attention at a hospital or doctor's office? Yes When did it last happen? Infancy  If all above answers are "NO", may proceed with cephalosporin use.    Septra [Sulfamethoxazole-Trimethoprim] Shortness Of Breath and Rash   Doxycycline Swelling and Other (See Comments)    Redness on the face   Lipitor [Atorvastatin] Other (See Comments)    Severe muscle aches   Morphine And Related Nausea And Vomiting and Other (See Comments)    Terrible headache   Zetia [Ezetimibe] Other (See Comments)    Severe stomach pain   Zocor [Simvastatin] Other (See Comments)    Severe muscle aches   Adhesive [Tape] Other (See Comments)    Redness and swelling - use paper tape   Vicodin [Hydrocodone-Acetaminophen] Nausea And Vomiting    Current Outpatient Medications  Medication Sig Dispense Refill   albuterol (PROVENTIL) (2.5 MG/3ML) 0.083% nebulizer solution Take 2.5 mg by nebulization every 6 (six) hours as needed for shortness of breath or wheezing.     albuterol (VENTOLIN HFA) 108 (90 Base) MCG/ACT inhaler Inhale 2 puffs into the lungs every 6 (six) hours as needed for wheezing or shortness of breath.     Alirocumab (PRALUENT) 150 MG/ML SOAJ INJECT  1 PEN INTO THE SKIN EVERY 14 (FOURTEEN) DAYS. 6 mL 3   bisoprolol (ZEBETA) 5 MG tablet TAKE 1/2 TABLET BY MOUTH EVERY DAY 90 tablet 2   clopidogrel (PLAVIX) 75 MG tablet TAKE 1 TABLET BY MOUTH EVERY DAY 90 tablet 2   empagliflozin (JARDIANCE) 10 MG TABS tablet Take 1 tablet by mouth daily. 90 tablet 3   Fluticasone-Umeclidin-Vilant (TRELEGY ELLIPTA) 100-62.5-25 MCG/INH AEPB Inhale 1 puff into the lungs daily. 60 each 11   furosemide (LASIX) 40 MG tablet TAKE 1 TABLET BY MOUTH AS NEEDED FOR FLUID OR EDEMA (Patient taking differently: Take 40 mg by mouth 2 (two) times a week. Mondays & Fridays in the morning) 90 tablet 0   Hypromellose 0.2 % SOLN Place 2 drops into both eyes 4 (four) times daily as needed (dry eyes).      lansoprazole (PREVACID) 30 MG capsule Take 30 mg by mouth in the morning.     NASAL SALINE NA Place 1 spray into the nose daily as needed (congestion).      nitroGLYCERIN (NITROSTAT) 0.4 MG SL tablet DISSOLVE ONE TABLET UNDER TONGUE AS NEEDED FOR CHEST PAIN EVERY 5 MINUTES 25 tablet 4   rosuvastatin (CRESTOR) 5 MG tablet Take 1 tablet (5 mg total) by mouth 3 (three) times a week. 36 tablet 3   sacubitril-valsartan (ENTRESTO) 24-26 MG Take 1 tablet by mouth 2 (two) times daily. Please wait to begin for 36 hours after discontinuing the Losartan. 180 tablet 3   simethicone (MYLICON) 80 MG chewable tablet Chew 2 tablets (160 mg total) by mouth every 6 (six) hours as needed for flatulence. 30 tablet 0   Spacer/Aero-Holding Chambers (AEROCHAMBER MV) inhaler Use as instructed 1 each 0   Specialty Vitamins Products ([employer] MENOPAUSE SUPPORT PO) Take 1 capsule by mouth in the morning.     spironolactone (ALDACTONE) 25 MG tablet TAKE 1 TABLET(25 MG) BY MOUTH DAILY 90 tablet 3   No current facility-administered medications for this visit.    PHYSICAL EXAM Vitals:   04/03/22 1003  BP: 121/60  Pulse: 73  Resp: 20  Temp: 97.9 F (36.6 C)  SpO2: 96%  Weight: 119 lb (54 kg)  Height: 4\' 11"   (1.499 m)    Well-appearing woman in no acute distress Regular rate and rhythm Unlabored breathing Warm well-perfused feet  PERTINENT LABORATORY AND RADIOLOGIC DATA  Most recent CBC    Latest Ref Rng & Units 10/20/2021    2:08 PM 09/25/2021    5:00 AM 09/24/2021   12:21 AM  CBC  WBC 4.0 - 10.5 K/uL 7.0  4.8  5.7   Hemoglobin 12.0 - 15.0 g/dL 12.1  10.1  9.6   Hematocrit 36.0 - 46.0 % 40.0  30.3  29.6   Platelets 150 - 400 K/uL 344  135  103      Most recent CMP    Latest Ref Rng & Units 10/20/2021    2:08 PM 09/25/2021    5:00 AM 09/24/2021   12:21 AM  CMP  Glucose 70 - 99 mg/dL 100  96  97   BUN 8 - 23 mg/dL 11  12  11    Creatinine 0.44 - 1.00 mg/dL 0.89  0.80  0.66   Sodium 135 - 145 mmol/L 138  139  136   Potassium 3.5 - 5.1 mmol/L 3.8  4.6  4.0   Chloride 98 - 111 mmol/L 105  105  105   CO2 22 - 32 mmol/L 21  28  24    Calcium 8.9 - 10.3 mg/dL 9.0  8.1  7.6    Hgb A1c MFr Bld (%)  Date Value  09/20/2021 5.5    LDL Chol Calc (NIH)  Date Value Ref Range Status  09/06/2021 101 (H) 0 - 99 mg/dL Final   CT angiogram reviewed in detail. Ventral hernia noted. Proximal ABF stenosis noted. No other technical challenges noted in the bypass.  Yevonne Aline. Stanford Breed, MD [facility] Vascular and Vein Specialists of [facility] Phone Number: [phone] 04/03/2022 2:58 PM  Total time spent on preparing this encounter including chart review, data review, collecting history, examining the patient, coordinating care for this established patient, 40 minutes.  Portions of this report may have been transcribed using voice recognition software.  Every effort has been made to ensure accuracy; however, inadvertent computerized transcription errors may still be present.

## 2022-04-13 ENCOUNTER — Other Ambulatory Visit: Payer: Self-pay

## 2022-04-13 ENCOUNTER — Ambulatory Visit (HOSPITAL_COMMUNITY)
Admission: RE | Admit: 2022-04-13 | Discharge: 2022-04-13 | Disposition: A | Discharge status: Home / Self Care | Payer: PPO | Attending: Vascular Surgery | Admitting: Vascular Surgery

## 2022-04-13 ENCOUNTER — Encounter (HOSPITAL_COMMUNITY)
Admission: RE | Disposition: A | Discharge status: Home / Self Care | Payer: Self-pay | Source: Home / Self Care | Attending: Vascular Surgery

## 2022-04-13 DIAGNOSIS — I5042 Chronic combined systolic (congestive) and diastolic (congestive) heart failure: Secondary | ICD-10-CM | POA: Insufficient documentation

## 2022-04-13 DIAGNOSIS — I2510 Atherosclerotic heart disease of native coronary artery without angina pectoris: Secondary | ICD-10-CM | POA: Insufficient documentation

## 2022-04-13 DIAGNOSIS — K439 Ventral hernia without obstruction or gangrene: Secondary | ICD-10-CM | POA: Insufficient documentation

## 2022-04-13 DIAGNOSIS — I739 Peripheral vascular disease, unspecified: Secondary | ICD-10-CM

## 2022-04-13 DIAGNOSIS — I110 Hypertensive heart disease with heart failure: Secondary | ICD-10-CM | POA: Diagnosis not present

## 2022-04-13 DIAGNOSIS — T82898A Other specified complication of vascular prosthetic devices, implants and grafts, initial encounter: Secondary | ICD-10-CM | POA: Diagnosis not present

## 2022-04-13 DIAGNOSIS — Z87891 Personal history of nicotine dependence: Secondary | ICD-10-CM | POA: Insufficient documentation

## 2022-04-13 DIAGNOSIS — J449 Chronic obstructive pulmonary disease, unspecified: Secondary | ICD-10-CM | POA: Diagnosis not present

## 2022-04-13 HISTORY — PX: ABDOMINAL AORTOGRAM W/LOWER EXTREMITY: CATH118223

## 2022-04-13 LAB — BASIC METABOLIC PANEL
Anion gap: 10 (ref 5–15)
BUN: 28 mg/dL — ABNORMAL HIGH (ref 8–23)
CO2: 24 mmol/L (ref 22–32)
Calcium: 9.5 mg/dL (ref 8.9–10.3)
Chloride: 104 mmol/L (ref 98–111)
Creatinine, Ser: 1.13 mg/dL — ABNORMAL HIGH (ref 0.44–1.00)
GFR, Estimated: 53 mL/min — ABNORMAL LOW (ref >60)
Glucose, Bld: 107 mg/dL — ABNORMAL HIGH (ref 70–99)
Potassium: 5.3 mmol/L — ABNORMAL HIGH (ref 3.5–5.1)
Sodium: 138 mmol/L (ref 135–145)

## 2022-04-13 LAB — POCT I-STAT, CHEM 8
BUN: 29 mg/dL — ABNORMAL HIGH (ref 8–23)
Calcium, Ion: 1.2 mmol/L (ref 1.15–1.40)
Chloride: 105 mmol/L (ref 98–111)
Creatinine, Ser: 1.2 mg/dL — ABNORMAL HIGH (ref 0.44–1.00)
Glucose, Bld: 107 mg/dL — ABNORMAL HIGH (ref 70–99)
HCT: 41 % (ref 36.0–46.0)
Hemoglobin: 13.9 g/dL (ref 12.0–15.0)
Potassium: 5.5 mmol/L — ABNORMAL HIGH (ref 3.5–5.1)
Sodium: 142 mmol/L (ref 135–145)
TCO2: 26 mmol/L (ref 22–32)

## 2022-04-13 SURGERY — ABDOMINAL AORTOGRAM W/LOWER EXTREMITY
Anesthesia: LOCAL

## 2022-04-13 MED ORDER — MIDAZOLAM HCL 5 MG/5ML IJ SOLN
INTRAMUSCULAR | Status: AC
  Filled 2022-04-13: qty 5

## 2022-04-13 MED ORDER — ONDANSETRON HCL 4 MG/2ML IJ SOLN: 4.0000 mg | Freq: Four times a day (QID) | INTRAMUSCULAR | Status: DC | PRN

## 2022-04-13 MED ORDER — SODIUM CHLORIDE 0.9 % IV SOLN: 250.0000 mL | INTRAVENOUS | Status: DC | PRN

## 2022-04-13 MED ORDER — SODIUM CHLORIDE 0.9% FLUSH: 3.0000 mL | Freq: Two times a day (BID) | INTRAVENOUS | Status: DC

## 2022-04-13 MED ORDER — MIDAZOLAM HCL 2 MG/2ML IJ SOLN
INTRAMUSCULAR | Status: DC | PRN
  Administered 2022-04-13: 1 mg via INTRAVENOUS

## 2022-04-13 MED ORDER — FENTANYL CITRATE (PF) 100 MCG/2ML IJ SOLN
INTRAMUSCULAR | Status: AC
  Filled 2022-04-13: qty 2

## 2022-04-13 MED ORDER — HYDRALAZINE HCL 20 MG/ML IJ SOLN: 5.0000 mg | INTRAMUSCULAR | Status: DC | PRN

## 2022-04-13 MED ORDER — LABETALOL HCL 5 MG/ML IV SOLN: 10.0000 mg | INTRAVENOUS | Status: DC | PRN

## 2022-04-13 MED ORDER — IODIXANOL 320 MG/ML IV SOLN
INTRAVENOUS | Status: DC | PRN
  Administered 2022-04-13: 15 mL

## 2022-04-13 MED ORDER — LIDOCAINE HCL (PF) 1 % IJ SOLN
INTRAMUSCULAR | Status: DC | PRN
  Administered 2022-04-13: 12 mL

## 2022-04-13 MED ORDER — ACETAMINOPHEN 325 MG PO TABS: 650.0000 mg | ORAL_TABLET | ORAL | Status: DC | PRN

## 2022-04-13 MED ORDER — HEPARIN (PORCINE) IN NACL 1000-0.9 UT/500ML-% IV SOLN
INTRAVENOUS | Status: AC
  Filled 2022-04-13: qty 1000

## 2022-04-13 MED ORDER — HEPARIN (PORCINE) IN NACL 1000-0.9 UT/500ML-% IV SOLN
INTRAVENOUS | Status: DC | PRN
  Administered 2022-04-13 (×2): 500 mL

## 2022-04-13 MED ORDER — FENTANYL CITRATE (PF) 100 MCG/2ML IJ SOLN
INTRAMUSCULAR | Status: DC | PRN
  Administered 2022-04-13: 50 ug via INTRAVENOUS

## 2022-04-13 MED ORDER — SODIUM CHLORIDE 0.9 % IV SOLN: INTRAVENOUS | Status: DC

## 2022-04-13 MED ORDER — SODIUM CHLORIDE 0.9% FLUSH: 3.0000 mL | INTRAVENOUS | Status: DC | PRN

## 2022-04-13 MED ORDER — SODIUM CHLORIDE 0.9 % WEIGHT BASED INFUSION: 1.0000 mL/kg/h | INTRAVENOUS | Status: DC

## 2022-04-13 MED ORDER — LIDOCAINE HCL (PF) 1 % IJ SOLN
INTRAMUSCULAR | Status: AC
  Filled 2022-04-13: qty 30

## 2022-04-13 SURGICAL SUPPLY — 13 items
CATH ACCU-VU SIZ PIG 5F 70CM (CATHETERS) IMPLANT
CATH OMNI FLUSH 5F 65CM (CATHETERS) IMPLANT
DEVICE CLOSURE MYNXGRIP 5F (Vascular Products) IMPLANT
KIT MICROPUNCTURE NIT STIFF (SHEATH) IMPLANT
KIT PV (KITS) ×2 IMPLANT
SHEATH PINNACLE 5F 10CM (SHEATH) IMPLANT
SHEATH PROBE COVER 6X72 (BAG) IMPLANT
STOPCOCK MORSE 400PSI 3WAY (MISCELLANEOUS) IMPLANT
SYR MEDRAD MARK 7 150ML (SYRINGE) ×2 IMPLANT
TRANSDUCER W/STOPCOCK (MISCELLANEOUS) ×2 IMPLANT
TRAY PV CATH (CUSTOM PROCEDURE TRAY) ×2 IMPLANT
TUBING CIL FLEX 10 FLL-RA (TUBING) IMPLANT
WIRE BENTSON .035X145CM (WIRE) IMPLANT

## 2022-04-13 NOTE — Op Note (Signed)
DATE OF SERVICE: 04/13/2022  PATIENT:  Teresa [name]  72 y.o. female  PRE-OPERATIVE DIAGNOSIS:  Atherosclerosis of proximal anastomosis of aortobifemoral bypass  POST-OPERATIVE DIAGNOSIS:  Same  PROCEDURE:   1) Ultrasound guided right common femoral artery access 2) Catheter in aorta with aortogram 3) Conscious sedation (20 minutes)  SURGEON:  Yevonne Aline. Stanford Breed, MD  ASSISTANT: none  ANESTHESIA:   local and IV sedation  ESTIMATED BLOOD LOSS: minimal  LOCAL MEDICATIONS USED:  LIDOCAINE   COUNTS: confirmed correct.  PATIENT DISPOSITION:  PACU - hemodynamically stable.   Delay start of Pharmacological VTE agent (>24hrs) due to surgical blood loss or risk of bleeding: no  INDICATION FOR PROCEDURE: [name] is a 72 y.o. female with proximal anastomosis stenosis on recent ct scan done for vental hernia. After careful discussion of risks, benefits, and alternatives the patient was offered aortogram. The patient understood and wished to proceed.  OPERATIVE FINDINGS:  Terminal aorta and iliac arteries: Previously demonstrated stenosis of the proximal anastomosis not as severe as suggested on CT angiogram.  No pressure difference between descending thoracic aorta and aortobifemoral main body.  I elected not to intervene.  DESCRIPTION OF PROCEDURE: After identification of the patient in the pre-operative holding area, the patient was transferred to the operating room. The patient was positioned supine on the operating room table.  Anesthesia was induced. The groins was prepped and draped in standard fashion. A surgical pause was performed confirming correct patient, procedure, and operative location.  The right groin was anesthetized with subcutaneous injection of 1% lidocaine. Using ultrasound guidance, the right common femoral artery was accessed with micropuncture technique. Fluoroscopy was used to confirm cannulation over the femoral head. The 64F sheath was upsized to 38F.   A  Benson wire was advanced into the distal aorta. Over the wire an omni flush catheter was advanced to the level of L2. Aortogram was performed - see above for details.  Pressures were measured above and below the area of stenosis.  There was minimal difference in systolic blood pressure across this area of concern.  I elected not to intervene.  A minx device was used to close the arteriotomy. Hemostasis was excellent upon completion.  Conscious sedation was administered with the use of IV fentanyl and midazolam under continuous physician and nurse monitoring.  Heart rate, blood pressure, and oxygen saturation were continuously monitored.  Total sedation time was 20 minutes  Upon completion of the case instrument and sharps counts were confirmed correct. The patient was transferred to the PACU in good condition. I was present for all portions of the procedure.  PLAN: Continue best medical therapy for atherosclerosis.  Follow-up in 2 to 3 months with the after ventral hernia repair.  Yevonne Aline. Stanford Breed, MD Vascular and Vein Specialists of [facility] Phone Number: [phone] 04/13/2022 12:23 PM

## 2022-04-13 NOTE — Progress Notes (Signed)
First K+ 7.9 via ISTAT, redrawn into tube, was 5.5 ISTAT, tube was sent for bmet, Eliezer Champagne RN/ cath lab aware.

## 2022-04-13 NOTE — Interval H&P Note (Signed)
History and Physical Interval Note:  04/13/2022 12:23 PM  Teresa [name]  has presented today for surgery, with the diagnosis of pad.  The various methods of treatment have been discussed with the patient and family. After consideration of risks, benefits and other options for treatment, the patient has consented to  Procedure(s): ABDOMINAL AORTOGRAM W/LOWER EXTREMITY (N/A) as a surgical intervention.  The patient's history has been reviewed, patient examined, no change in status, stable for surgery.  I have reviewed the patient's chart and labs.  Questions were answered to the patient's satisfaction.     [name]

## 2022-04-16 ENCOUNTER — Other Ambulatory Visit: Payer: Self-pay | Admitting: Internal Medicine

## 2022-04-16 ENCOUNTER — Other Ambulatory Visit: Payer: Self-pay | Admitting: Cardiology

## 2022-04-16 ENCOUNTER — Encounter (HOSPITAL_COMMUNITY): Payer: Self-pay | Admitting: Vascular Surgery

## 2022-04-17 ENCOUNTER — Telehealth: Payer: Self-pay | Admitting: Vascular Surgery

## 2022-04-17 NOTE — Telephone Encounter (Signed)
-----   Message from [name], MD sent at 04/13/2022  4:55 PM EST ----- Teresa [name] 04/13/2022 Procedure: 1) Ultrasound guided right common femoral artery access 2) Catheter in aorta with aortogram 3) Conscious sedation (20 minutes)  Assistant: none Follow up: 2 to 3 months with me Studies for follow up: ABI  Thank you! Gershon Mussel

## 2022-04-18 ENCOUNTER — Other Ambulatory Visit: Payer: Self-pay

## 2022-04-24 DIAGNOSIS — K219 Gastro-esophageal reflux disease without esophagitis: Secondary | ICD-10-CM | POA: Diagnosis not present

## 2022-04-24 DIAGNOSIS — I10 Essential (primary) hypertension: Secondary | ICD-10-CM | POA: Diagnosis not present

## 2022-04-24 DIAGNOSIS — I5042 Chronic combined systolic (congestive) and diastolic (congestive) heart failure: Secondary | ICD-10-CM | POA: Diagnosis not present

## 2022-04-24 DIAGNOSIS — E785 Hyperlipidemia, unspecified: Secondary | ICD-10-CM | POA: Diagnosis not present

## 2022-04-25 LAB — POCT I-STAT, CHEM 8
BUN: 46 mg/dL — ABNORMAL HIGH (ref 8–23)
Calcium, Ion: 1.09 mmol/L — ABNORMAL LOW (ref 1.15–1.40)
Chloride: 106 mmol/L (ref 98–111)
Creatinine, Ser: 1.1 mg/dL — ABNORMAL HIGH (ref 0.44–1.00)
Glucose, Bld: 97 mg/dL (ref 70–99)
HCT: 43 % (ref 36.0–46.0)
Hemoglobin: 14.6 g/dL (ref 12.0–15.0)
Potassium: 7.9 mmol/L (ref 3.5–5.1)
Sodium: 138 mmol/L (ref 135–145)
TCO2: 27 mmol/L (ref 22–32)

## 2022-04-27 ENCOUNTER — Other Ambulatory Visit (HOSPITAL_BASED_OUTPATIENT_CLINIC_OR_DEPARTMENT_OTHER): Payer: Self-pay

## 2022-04-27 ENCOUNTER — Ambulatory Visit (HOSPITAL_COMMUNITY)
Admission: RE | Admit: 2022-04-27 | Discharge: 2022-04-27 | Disposition: A | Discharge status: Home / Self Care | Payer: PPO | Source: Ambulatory Visit | Attending: Internal Medicine | Admitting: Internal Medicine

## 2022-04-27 ENCOUNTER — Encounter (HOSPITAL_COMMUNITY): Payer: Self-pay | Admitting: Internal Medicine

## 2022-04-27 VITALS — BP 102/60 | HR 56 | Wt 118.2 lb

## 2022-04-27 DIAGNOSIS — Z79899 Other long term (current) drug therapy: Secondary | ICD-10-CM | POA: Diagnosis not present

## 2022-04-27 DIAGNOSIS — K432 Incisional hernia without obstruction or gangrene: Secondary | ICD-10-CM | POA: Insufficient documentation

## 2022-04-27 DIAGNOSIS — E785 Hyperlipidemia, unspecified: Secondary | ICD-10-CM | POA: Insufficient documentation

## 2022-04-27 DIAGNOSIS — I739 Peripheral vascular disease, unspecified: Secondary | ICD-10-CM

## 2022-04-27 DIAGNOSIS — Z952 Presence of prosthetic heart valve: Secondary | ICD-10-CM

## 2022-04-27 DIAGNOSIS — I5042 Chronic combined systolic (congestive) and diastolic (congestive) heart failure: Secondary | ICD-10-CM | POA: Diagnosis not present

## 2022-04-27 DIAGNOSIS — Z7984 Long term (current) use of oral hypoglycemic drugs: Secondary | ICD-10-CM | POA: Insufficient documentation

## 2022-04-27 DIAGNOSIS — Z01810 Encounter for preprocedural cardiovascular examination: Secondary | ICD-10-CM | POA: Diagnosis not present

## 2022-04-27 DIAGNOSIS — Z87891 Personal history of nicotine dependence: Secondary | ICD-10-CM | POA: Diagnosis not present

## 2022-04-27 DIAGNOSIS — I2510 Atherosclerotic heart disease of native coronary artery without angina pectoris: Secondary | ICD-10-CM | POA: Insufficient documentation

## 2022-04-27 DIAGNOSIS — J449 Chronic obstructive pulmonary disease, unspecified: Secondary | ICD-10-CM | POA: Diagnosis not present

## 2022-04-27 DIAGNOSIS — I080 Rheumatic disorders of both mitral and aortic valves: Secondary | ICD-10-CM | POA: Diagnosis not present

## 2022-04-27 DIAGNOSIS — Z7902 Long term (current) use of antithrombotics/antiplatelets: Secondary | ICD-10-CM | POA: Insufficient documentation

## 2022-04-27 DIAGNOSIS — R0989 Other specified symptoms and signs involving the circulatory and respiratory systems: Secondary | ICD-10-CM | POA: Insufficient documentation

## 2022-04-27 DIAGNOSIS — Z85118 Personal history of other malignant neoplasm of bronchus and lung: Secondary | ICD-10-CM | POA: Insufficient documentation

## 2022-04-27 MED ORDER — ROSUVASTATIN CALCIUM 5 MG PO TABS: 5.0000 mg | ORAL_TABLET | ORAL | 3 refills | Status: DC

## 2022-04-27 MED ORDER — CLOPIDOGREL BISULFATE 75 MG PO TABS: 75.0000 mg | ORAL_TABLET | Freq: Every day | ORAL | 3 refills | Status: DC

## 2022-04-27 MED ORDER — SPIRONOLACTONE 25 MG PO TABS: ORAL_TABLET | ORAL | 3 refills | Status: DC

## 2022-04-27 MED ORDER — EMPAGLIFLOZIN 10 MG PO TABS
10.0000 mg | ORAL_TABLET | Freq: Every day | ORAL | 3 refills | Status: DC
  Filled 2022-04-27: qty 90, fill #0
  Filled 2022-06-22: qty 90, 90d supply, fill #0
  Filled 2022-09-15 – 2022-09-18 (×5): qty 90, 90d supply, fill #1
  Filled 2022-12-14: qty 90, 90d supply, fill #2
  Filled 2023-02-26: qty 90, 90d supply, fill #3

## 2022-04-27 MED ORDER — ENTRESTO 24-26 MG PO TABS: 1.0000 | ORAL_TABLET | Freq: Two times a day (BID) | ORAL | 3 refills | Status: DC

## 2022-04-27 MED ORDER — BISOPROLOL FUMARATE 5 MG PO TABS: 2.5000 mg | ORAL_TABLET | Freq: Every day | ORAL | 3 refills | Status: DC

## 2022-04-27 MED ORDER — FUROSEMIDE 40 MG PO TABS: ORAL_TABLET | ORAL | 3 refills | Status: DC

## 2022-04-27 NOTE — Patient Instructions (Signed)
There has been no changes to your medications.  Your physician recommends that you schedule a follow-up appointment in: 1 year ( February 2025) ** please call the office in November to arrange your follow up appointment.**  If you have any questions or concerns before your next appointment please send [place] a message through [place] or call our office at [phone].    TO LEAVE A MESSAGE FOR THE NURSE SELECT OPTION 2, PLEASE LEAVE A MESSAGE INCLUDING: YOUR NAME DATE OF BIRTH CALL BACK NUMBER REASON FOR CALL**this is important as we prioritize the call backs  YOU WILL RECEIVE A CALL BACK THE SAME DAY AS LONG AS YOU CALL BEFORE 4:00 PM  At the [facility], you and your health needs are our priority. As part of our continuing mission to provide you with exceptional heart care, we have created designated Provider Care Teams. These Care Teams include your primary Cardiologist (physician) and Advanced Practice Providers (APPs- Physician Assistants and Nurse Practitioners) who all work together to provide you with the care you need, when you need it.   You may see any of the following providers on your designated Care Team at your next follow up: Dr Glori Bickers Dr Loralie Champagne Dr. Roxana Hires, NP Lyda Jester, [place] [facility] [place], [place] Forestine Na, NP Audry Riles, PharmD   Please be sure to bring in all your medications bottles to every appointment.    Thank you for choosing [facility]

## 2022-04-27 NOTE — Progress Notes (Signed)
PCP: Leeroy Cha, MD Primary Cardiologist: Dr Haroldine Laws   HPI: [name] is a 72 y.o. woman year old with history of COPD, lung CA s/p RUL resection 2014, CAD (prior stenting of the LM, LAD and circumflex in the setting of dissection), AS s/p TAVR [id number] and systolic HF with recovered EF.    1 yr TAVR echo: Echo 7/21 EF 40-45%, normally functioning TAVR with a mean gradient of 14 mmHg and trivial-mild PVL. 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 Mild MR.  Seen in 5/22 for unscheduled visit due to CP. Had mild volume overload and given additional lasix.   Echo  04/21/21 EF 50-55% apical aneurysm.   S/p infrarenal abdominal aortic endarterectomy with aortobifemoral bypass & bilateral common femoral artery endarterectomies by Dr. Stanford Breed on 09/20/2021  Returns for f/u. Says she feels great. Walking without claudication, CP or SOB. Only complaint is large abdominal incisional hernia. Planning repair with Dr, Kieth Brightly this summer.    Cardiac Studies:   - Echo 04/21/21 EF 50-55% apical aneurysm.   - Echo 09/24/18 EF 30-35%. TAVR ok. Mild MR.   - Echo 08/2018 EF 25% , severe aortic stenosis, severe mitral regurgitation.    - LHC 08/19/2018 1. Triple vessel CAD.  2. Patent stents in the left main, LAD and Circumflex (placed at the time of dissection) 3. Chronic occlusion small Obtuse marginal branch which fills from right to left collaterals 4. Patent mid to distal RCA stent. The proximal RCA has a moderate non-obstructive stenosis which does not appear to be flow limiting. This is unchanged from her last cath.  5. Severe aortic stenosis (mean gradient 21 mmHg, peak to peak gradient 36 mmHg, AVA 0.71 cm2) 6. Elevated filling pressures c/w acute volume overload (RA 9/14,   ROS: All systems negative except as listed in HPI, PMH and Problem List.  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  Tobacco Use   Smoking status: Former    Packs/day: 0.50    Years: 40.00    Total pack years: 20.00    Types: Cigarettes    Quit date: 10/08/2012    Years since quitting: 9.5   Smokeless tobacco: Never  Vaping Use   Vaping Use: Never used  Substance and Sexual Activity   Alcohol use: No   Drug use: No   Sexual activity: Yes  Other Topics Concern   Not on file  Social History Narrative   Not on file   Social Determinants of Health   Financial Resource Strain: Low Risk  (10/07/2018)   Overall Financial Resource Strain (CARDIA)    Difficulty of Paying Living Expenses: Not hard at all  Food Insecurity: No Food Insecurity (10/07/2018)   Hunger Vital Sign    Worried About Running Out of Food in the Last Year: Never true    [place] in the Last Year: Never true  Transportation Needs: No Transportation Needs (10/07/2018)   PRAPARE - Hydrologist (Medical): No    Lack of Transportation (Non-Medical): No  Physical Activity: Insufficiently Active (10/07/2018)   Exercise Vital Sign    Days of Exercise per Week: 4 days    Minutes of Exercise per Session: 30 min  Stress: No Stress Concern Present (10/07/2018)   [place]    Feeling of Stress : Only a little  Social Connections: Not on file  Intimate Partner Violence: Not on file    FH:  Family History  Problem Relation Age of Onset   Alcohol abuse Father    Lupus Mother    Other Sister        Degenerative disc disease   Migraines Sister    Pulmonary fibrosis Brother    Heart attack Neg Hx     Past Medical History:  Diagnosis Date   AAA (abdominal aortic aneurysm) ([place])    a. small by cath 2014.   Acute on chronic combined systolic and diastolic heart failure ([place]) 08/19/2018   Arthritis     "across my hips; buttocks; comes w/the weather" (02/27/2013)   CAD (coronary artery disease), native coronary artery    a. 10/2012 - rotational atherectomy of LAD c/b dissection s/p PCI to LAD, LCx, LM. b. 12/2012 - s/p overlapping DES to RCA.   Cancer [facility])    Cataract    "just the beginnings on the right" (02/27/2013)   Chronic combined systolic and diastolic CHF (congestive heart failure) (HCC)    COPD (chronic obstructive pulmonary disease) (HCC)    Coronary artery disease    GERD (gastroesophageal reflux disease)    Hypercholesterolemia    Ischemic cardiomyopathy    a. EF 45-50%.   Mass of lung    "small tumor RUL; they are watching it" (02/27/2013)   Mitral regurgitation    Peripheral vascular disease, unspecified (HCC)    Pneumonia    PONV (postoperative nausea and vomiting)    S/P TAVR (transcatheter aortic valve replacement)    23 mm Edwards Sapien 3 transcatheter heart valve placed via open left transaxillary approach    Severe aortic stenosis    Severe mitral regurgitation    Tachycardia, unspecified    Tobacco abuse     Current Outpatient Medications  Medication Sig Dispense Refill   acetaminophen (TYLENOL) 500 MG tablet Take 1,000 mg by mouth in the morning, at noon, and at bedtime.     albuterol (PROVENTIL) (2.5 MG/3ML) 0.083% nebulizer solution Take 2.5 mg by nebulization every 6 (six) hours as needed for shortness of breath or wheezing.     albuterol (VENTOLIN HFA) 108 (90 Base) MCG/ACT inhaler Inhale 2 puffs into the lungs every 6 (six) hours as needed for wheezing or shortness of breath.     Alirocumab (PRALUENT) 150 MG/ML SOAJ INJECT 1 PEN INTO THE SKIN EVERY 14 (FOURTEEN) DAYS. 6 mL 3   bisoprolol (ZEBETA) 5 MG tablet TAKE 1/2 TABLET BY MOUTH EVERY DAY 90 tablet 2   clopidogrel (PLAVIX) 75 MG tablet TAKE 1 TABLET BY MOUTH EVERY DAY 90 tablet 2   empagliflozin (JARDIANCE) 10 MG TABS tablet Take 1 tablet by mouth daily. 90 tablet 3    Fluticasone-Umeclidin-Vilant (TRELEGY ELLIPTA) 100-62.5-25 MCG/INH AEPB Inhale 1 puff into the lungs daily. 60 each 11   furosemide (LASIX) 40 MG tablet TAKE 1 TABLET BY MOUTH AS NEEDED FOR FLUID OR EDEMA 90 tablet 0   Hypromellose 0.2 % SOLN Place 2 drops into both eyes 4 (four) times daily as needed (dry eyes).      lansoprazole (PREVACID) 30 MG capsule Take 30 mg by mouth in the morning.     NASAL SALINE NA Place 1 spray into the nose every 4 (four) hours as needed (congestion).  nitroGLYCERIN (NITROSTAT) 0.4 MG SL tablet DISSOLVE ONE TABLET UNDER TONGUE AS NEEDED FOR CHEST PAIN EVERY 5 MINUTES 25 tablet 4   rosuvastatin (CRESTOR) 5 MG tablet Take 1 tablet (5 mg total) by mouth 3 (three) times a week. 36 tablet 3   sacubitril-valsartan (ENTRESTO) 24-26 MG Take 1 tablet by mouth 2 (two) times daily. Please wait to begin for 36 hours after discontinuing the Losartan. 180 tablet 3   simethicone (MYLICON) 80 MG chewable tablet Chew 2 tablets (160 mg total) by mouth every 6 (six) hours as needed for flatulence. 30 tablet 0   Spacer/Aero-Holding Chambers (AEROCHAMBER MV) inhaler Use as instructed 1 each 0   Specialty Vitamins Products ([employer] MENOPAUSE SUPPORT PO) Take 1 capsule by mouth in the morning.     spironolactone (ALDACTONE) 25 MG tablet TAKE 1 TABLET(25 MG) BY MOUTH DAILY 90 tablet 3   No current facility-administered medications for this encounter.    BP 102/60   Pulse (!) 56   Wt 53.6 kg (118 lb 3.2 oz)   LMP 10/04/1999   SpO2 97%   BMI 23.47 kg/m   Wt Readings from Last 3 Encounters:  04/27/22 53.6 kg (118 lb 3.2 oz)  04/13/22 53.1 kg (117 lb)  04/03/22 54 kg (119 lb)   PHYSICAL EXAM: General:  Well appearing. No resp difficulty HEENT: normal Neck: supple. no JVD. Carotids 2+ bilat; + bruits. No lymphadenopathy or thryomegaly appreciated. Cor: PMI nondisplaced. Regular rate & rhythm. 2/6 SEM RSB Lungs: clear Abdomen: soft, nontender, nondistended. No hepatosplenomegaly.  No bruits or masses. Good bowel sounds. + hernia Extremities: no cyanosis, clubbing, rash, edema Neuro: alert & orientedx3, cranial nerves grossly intact. moves all 4 extremities w/o difficulty. Affect pleasant   ECG SB 55 bpm anteroseptal qs Personally reviewed    ASSESSMENT & PLAN: 1. Chronic Systolic HF. ICM + valvular heart disease - previous ECHO with EF 40-45%.  - Echo (6/20): EF 20-25% with severe MR - Echo (1/21): EF 50% Apex hypokinetic - Echo (7/21): EF 40-45%, normal TAVR mean gradient 14 mmHg and trivial-mild PVL. 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 Mild MR. - Echo (2/23): EF 50-55% apical aneurysm. TAVR valve (Sapien) ok - Echo 7/23 EF 55-60% TAVR ok  - NYHA I-II, volume looks good today. - Continue Lasix 20 mg 3x/week  - Continue spironolactone 25 mg daily.  - Continue bisoprolol 2.5 mg daily.  - Continue Entresto 24/26 mg bid. - Continue Jardiance 10 mg daily. - Labs today.   2. Severe AS - S/P TAVR on 08/26/2018  - Continue Plavix.  - Stable on echo 2/23 - Reminded of need for SBE prophylaxis.   3. Severe MR -> Mild MR - Functional. - Improved with TAVR and treatment of HF  4. CAD - Patent stents on LHC 6/20  - No s/s ischemia - Follows with Dr. Irish Lack   5. COPD  - Quit smoking 2014. - Followed by Dr. Melvyn Novas   6. H/O Lung Cancer  - s/p resection 2014.  7. Carotid bruits - Radiated from AoV - Carotid US 6/20 1-39% bilaterally  8. Hyperlipidemia - Remains on Praulent. - Per Dr. Irish Lack and [facility].  9. PAD  - Followed by Dr. Stanford Breed - now s/p aortobifemoral bypass  10. Pre-op clearance for abdominal incisional hernia - ok to proceed low to moderate risk for CV complications   Glori Bickers, MD  9:19 AM

## 2022-04-27 NOTE — Addendum Note (Signed)
Encounter addended by: Jerl Mina, RN on: 04/27/2022 9:32 AM  Actions taken: Pharmacy for encounter modified, Order list changed, Clinical Note Signed

## 2022-04-28 ENCOUNTER — Other Ambulatory Visit (HOSPITAL_COMMUNITY): Payer: Self-pay

## 2022-06-07 ENCOUNTER — Ambulatory Visit: Payer: PPO | Attending: Interventional Cardiology | Admitting: Interventional Cardiology

## 2022-06-07 ENCOUNTER — Encounter: Payer: Self-pay | Admitting: Interventional Cardiology

## 2022-06-07 VITALS — BP 118/72 | HR 57 | Ht 59.5 in | Wt 113.6 lb

## 2022-06-07 DIAGNOSIS — E785 Hyperlipidemia, unspecified: Secondary | ICD-10-CM

## 2022-06-07 DIAGNOSIS — I739 Peripheral vascular disease, unspecified: Secondary | ICD-10-CM | POA: Diagnosis not present

## 2022-06-07 DIAGNOSIS — I2510 Atherosclerotic heart disease of native coronary artery without angina pectoris: Secondary | ICD-10-CM

## 2022-06-07 DIAGNOSIS — Z952 Presence of prosthetic heart valve: Secondary | ICD-10-CM | POA: Diagnosis not present

## 2022-06-07 DIAGNOSIS — Z85118 Personal history of other malignant neoplasm of bronchus and lung: Secondary | ICD-10-CM

## 2022-06-07 DIAGNOSIS — I340 Nonrheumatic mitral (valve) insufficiency: Secondary | ICD-10-CM

## 2022-06-07 MED ORDER — NITROGLYCERIN 0.4 MG SL SUBL
SUBLINGUAL_TABLET | SUBLINGUAL | 4 refills | Status: DC
  Filled 2023-06-03: qty 25, 7d supply, fill #0
  Filled 2023-06-07: qty 25, 7d supply, fill #1

## 2022-06-07 NOTE — Patient Instructions (Signed)
Medication Instructions:  Your physician recommends that you continue on your current medications as directed. Please refer to the Current Medication list given to you today.  *If you need a refill on your cardiac medications before your next appointment, please call your pharmacy*   Lab Work: none If you have labs (blood work) drawn today and your tests are completely normal, you will receive your results only by: MyChart Message (if you have MyChart) OR A paper copy in the mail If you have any lab test that is abnormal or we need to change your treatment, we will call you to review the results.   Testing/Procedures: none   Follow-Up: At [facility], you and your health needs are our priority.  As part of our continuing mission to provide you with exceptional heart care, we have created designated Provider Care Teams.  These Care Teams include your primary Cardiologist (physician) and Advanced Practice Providers (APPs -  Physician Assistants and Nurse Practitioners) who all work together to provide you with the care you need, when you need it.  We recommend signing up for the patient portal called "MyChart".  Sign up information is provided on this After Visit Summary.  MyChart is used to connect with patients for Virtual Visits (Telemedicine).  Patients are able to view lab/test results, encounter notes, upcoming appointments, etc.  Non-urgent messages can be sent to your provider as well.   To learn more about what you can do with MyChart, go to [email or web address].    Your next appointment:   12 month(s)  Provider:   Jayadeep Varanasi, MD     Other Instructions    

## 2022-06-07 NOTE — Progress Notes (Signed)
Cardiology Office Note   Date:  06/07/2022   ID:  Teresa [name], Teresa [name] Sep 20, 1950, MRN [id number]  PCP:  Leeroy Cha, MD    No chief complaint on file.  CAD  Wt Readings from Last 3 Encounters:  06/07/22 113 lb 9.6 oz (51.5 kg)  04/27/22 118 lb 3.2 oz (53.6 kg)  04/13/22 117 lb (53.1 kg)       History of Present Illness: Teresa [name] is a 72 y.o. female  with history of COPD, lung CA s/p RUL resection 2014, CAD (prior stenting of the LM, LAD and circumflex in the setting of dissection), AS s/p TAVR [id number] and systolic HF with recovered EF.    Last cath: "[place] 08/19/2018 1. Triple vessel CAD.  2. Patent stents in the left main, LAD and Circumflex (placed at the time of dissection) 3. Chronic occlusion small Obtuse marginal branch which fills from right to left collaterals 4. Patent mid to distal RCA stent. The proximal RCA has a moderate non-obstructive stenosis which does not appear to be flow limiting. This is unchanged from her last cath.  5. Severe aortic stenosis (mean gradient 21 mmHg, peak to peak gradient 36 mmHg, AVA 0.71 cm2) 6. Elevated filling pressures c/w acute volume overload"   Seen in 5/22 by CHF due to CP. Had mild volume overload and given additional lasix.    Noted claudication L > R. Saw  Dr. Stanford Breed in [facility]. L ABI 0.74 R 0.93. Advised exercise, Will see again in March.  Complaint with meds. Frustrated about her weight gain.    Echo 04/21/21 EF 50-55% apical aneurysm.  From Dr. Stanford Breed during angio after aorto-bifem: "Terminal aorta and iliac arteries: Previously demonstrated stenosis of the proximal anastomosis not as severe as suggested on CT angiogram.  No pressure difference between descending thoracic aorta and aortobifemoral main body.  I elected not to intervene."  Planning for ventral hernia.  No bleeding on Plavix.  Easy Bruising  Past Medical History:  Diagnosis Date   AAA (abdominal aortic aneurysm)    a. small by cath 2014.    Acute on chronic combined systolic and diastolic heart failure [id number]   Arthritis    "across my hips; buttocks; comes w/the weather" (02/27/2013)   CAD (coronary artery disease), native coronary artery    a. 10/2012 - rotational atherectomy of LAD c/b dissection s/p PCI to LAD, LCx, LM. b. 12/2012 - s/p overlapping DES to RCA.   Cancer    Cataract    "just the beginnings on the right" (02/27/2013)   Chronic combined systolic and diastolic CHF (congestive heart failure)    COPD (chronic obstructive pulmonary disease)    Coronary artery disease    GERD (gastroesophageal reflux disease)    Hypercholesterolemia    Ischemic cardiomyopathy    a. EF 45-50%.   Mass of lung    "small tumor RUL; they are watching it" (02/27/2013)   Mitral regurgitation    Peripheral vascular disease, unspecified    Pneumonia    PONV (postoperative nausea and vomiting)    S/P TAVR (transcatheter aortic valve replacement)    23 mm Edwards Sapien 3 transcatheter heart valve placed via open left transaxillary approach    Severe aortic stenosis    Severe mitral regurgitation    Tachycardia, unspecified    Tobacco abuse     Past Surgical History:  Procedure Laterality Date   ABDOMINAL AORTOGRAM W/LOWER EXTREMITY N/A 08/03/2021   Procedure: ABDOMINAL AORTOGRAM W/LOWER EXTREMITY;  Surgeon:  Marty Heck, MD;  Location: [name];  Service: Cardiovascular;  Laterality: N/A;   ABDOMINAL AORTOGRAM W/LOWER EXTREMITY N/A 04/13/2022   Procedure: ABDOMINAL AORTOGRAM W/LOWER EXTREMITY;  Surgeon: [name], MD;  Location: [place] [facility];  Service: Cardiovascular;  Laterality: N/A;   AORTA - BILATERAL FEMORAL ARTERY BYPASS GRAFT Bilateral 09/20/2021   Procedure: AORTA BIFEMORAL BYPASS GRAFT WITH EPIDURAL CATHETER and  aortic endartarectomey and bilateral femoral endartarectomies.;  Surgeon: [name], MD;  Location: [facility] OR;  Service: Vascular;  Laterality: Bilateral;   CARDIAC  CATHETERIZATION  08/2018   CORONARY ANGIOPLASTY WITH STENT PLACEMENT  10/2012; 12/08/2012   "7 + 3" (02/27/2013)   ESOPHAGOGASTRODUODENOSCOPY (EGD) WITH PROPOFOL N/A 01/11/2015   Procedure: ESOPHAGOGASTRODUODENOSCOPY (EGD) WITH PROPOFOL;  Surgeon: Garlan Fair, MD;  Location: [facility] ENDOS[facility];  Service: Endoscopy;  Laterality: N/A;   FLEXIBLE BRONCHOSCOPY N/A 03/02/2013   Procedure: FLEXIBLE BRONCHOSCOPY;  Surgeon: Gaye Pollack, MD;  Location: [facility];  Service: Thoracic;  Laterality: N/A;   FLEXIBLE SIGMOIDOSCOPY N/A 01/11/2015   Procedure: FLEXIBLE SIGMOIDOSCOPY;  Surgeon: Garlan Fair, MD;  Location: [facility] ENDOSCOPY;  Service: Endoscopy;  Laterality: N/A;  unable to complete colon-prep issues   GANGLION CYST EXCISION Left 1975   "wrist"   LEFT HEART CATH AND CORONARY ANGIOGRAPHY N/A 06/29/2016   Procedure: Left Heart Cath and Coronary Angiography;  Surgeon: Jettie Booze, MD;  Location: [place] [facility];  Service: Cardiovascular;  Laterality: N/A;   LEFT HEART CATH AND CORONARY ANGIOGRAPHY N/A 08/19/2018   Procedure: LEFT HEART CATH AND CORONARY ANGIOGRAPHY;  Surgeon: Burnell Blanks, MD;  Location: [place] [facility];  Service: Cardiovascular;  Laterality: N/A;   LEFT HEART CATHETERIZATION WITH CORONARY ANGIOGRAM N/A 12/08/2012   Procedure: LEFT HEART CATHETERIZATION WITH CORONARY ANGIOGRAM;  Surgeon: Jettie Booze, MD;  Location[facility] CATH LAB;  Service: Cardiovascular;  Laterality: N/A;   MOUTH SURGERY  2010?   "for bone loss" (06/24/2012)   PERCUTANEOUS CORONARY STENT INTERVENTION (PCI-S) N/A 10/09/2012   Procedure: PERCUTANEOUS CORONARY STENT INTERVENTION (PCI-S);  Surgeon: Jettie Booze, MD;  [facility] LAB;  Service: Cardiovascular;  Laterality: N/A;   RIGHT HEART CATH N/A 08/19/2018   Procedure: RIGHT HEART CATH;  Surgeon: Burnell Blanks, MD;  Location: [place] [facility];  Service: Cardiovascular;  Laterality: N/A;   TEE WITHOUT CARDIOVERSION N/A  08/22/2018   Procedure: TRANSESOPHAGEAL ECHOCARDIOGRAM (TEE);  Surgeon: Pixie Casino, MD;  Location: [facility] ENDOSCOPY;  Service: Cardiovascular;  Laterality: N/A;   TEE WITHOUT CARDIOVERSION N/A 08/26/2018   Procedure: TRANSESOPHAGEAL ECHOCARDIOGRAM (TEE);  Surgeon: Burnell Blanks, MD;  Location: [place];  Service: Open Heart Surgery;  Laterality: N/A;   THORACOTOMY/LOBECTOMY Right 03/02/2013   Procedure: Right Video Assisted Thoracoscopy/Thoracotomy with upper Lobectomy;  Surgeon: Gaye Pollack, MD;  Location[facility] OR;  Service: Thoracic;  Laterality: Right;  Right Lung Upper  Lobectomy    TONSILLECTOMY  1960   VIDEO BRONCHOSCOPY  09/12/2011   Procedure: VIDEO BRONCHOSCOPY WITH FLUORO;  Surgeon: Tanda Rockers, MD;  Location: [facility] ENDOSCOPY;  Service: Cardiopulmonary;  Laterality: Bilateral;     Current Outpatient Medications  Medication Sig Dispense Refill   acetaminophen (TYLENOL) 500 MG tablet Take 1,000 mg by mouth in the morning, at noon, and at bedtime.     albuterol (PROVENTIL) (2.5 MG/3ML) 0.083% nebulizer solution Take 2.5 mg by nebulization every 6 (six) hours as needed for shortness of breath or wheezing.  albuterol (VENTOLIN HFA) 108 (90 Base) MCG/ACT inhaler Inhale 2 puffs into the lungs every 6 (six) hours as needed for wheezing or shortness of breath.     Alirocumab (PRALUENT) 150 MG/ML SOAJ INJECT 1 PEN INTO THE SKIN EVERY 14 (FOURTEEN) DAYS. 6 mL 3   bisoprolol (ZEBETA) 5 MG tablet Take 0.5 tablets (2.5 mg total) by mouth daily. 45 tablet 3   clopidogrel (PLAVIX) 75 MG tablet Take 1 tablet (75 mg total) by mouth daily. 90 tablet 3   empagliflozin (JARDIANCE) 10 MG TABS tablet Take 1 tablet (10 mg total) by mouth daily. 90 tablet 3   Fluticasone-Umeclidin-Vilant (TRELEGY ELLIPTA) 100-62.5-25 MCG/INH AEPB Inhale 1 puff into the lungs daily. 60 each 11   furosemide (LASIX) 40 MG tablet TAKE 1 TABLET BY MOUTH AS NEEDED FOR FLUID OR EDEMA 90 tablet 3   Hypromellose 0.2 % SOLN  Place 2 drops into both eyes 4 (four) times daily as needed (dry eyes).      lansoprazole (PREVACID) 30 MG capsule Take 30 mg by mouth in the morning.     NASAL SALINE NA Place 1 spray into the nose every 4 (four) hours as needed (congestion).     nitroGLYCERIN (NITROSTAT) 0.4 MG SL tablet DISSOLVE ONE TABLET UNDER TONGUE AS NEEDED FOR CHEST PAIN EVERY 5 MINUTES 25 tablet 4   rosuvastatin (CRESTOR) 5 MG tablet Take 1 tablet (5 mg total) by mouth 3 (three) times a week. 90 tablet 3   sacubitril-valsartan (ENTRESTO) 24-26 MG Take 1 tablet by mouth 2 (two) times daily. Please wait to begin for 36 hours after discontinuing the Losartan. 180 tablet 3   simethicone (MYLICON) 80 MG chewable tablet Chew 2 tablets (160 mg total) by mouth every 6 (six) hours as needed for flatulence. 30 tablet 0   Spacer/Aero-Holding Chambers (AEROCHAMBER MV) inhaler Use as instructed 1 each 0   Specialty Vitamins Products ([employer] MENOPAUSE SUPPORT PO) Take 1 capsule by mouth in the morning.     spironolactone (ALDACTONE) 25 MG tablet TAKE 1 TABLET(25 MG) BY MOUTH DAILY 90 tablet 3   No current facility-administered medications for this visit.    Allergies:   Azithromycin, Cefaclor, Penicillins, Septra [sulfamethoxazole-trimethoprim], Doxycycline, Lipitor [atorvastatin], Morphine and related, Zetia [ezetimibe], Zocor [simvastatin], Adhesive [tape], and Vicodin [hydrocodone-acetaminophen]    Social History:  The patient  reports that she quit smoking about 9 years ago. Her smoking use included cigarettes. She has a 20.00 pack-year smoking history. She has never used smokeless tobacco. She reports that she does not drink alcohol and does not use drugs.   Family History:  The patient's family history includes Alcohol abuse in her father; Lupus in her mother; Migraines in her sister; Other in her sister; Pulmonary fibrosis in her brother.    ROS:  Please see the history of present illness.   Otherwise, review of systems are  positive for intentional weight loss.   All other systems are reviewed and negative.    PHYSICAL EXAM: VS:  BP 118/72   Pulse (!) 57   Ht 4' 11.5" (1.511 m)   Wt 113 lb 9.6 oz (51.5 kg)   LMP 10/04/1999   SpO2 98%   BMI 22.56 kg/m  , BMI Body mass index is 22.56 kg/m. GEN: Well nourished, well developed, in no acute distress HEENT: normal Neck: no JVD, carotid bruits, or masses Cardiac: RRR; 2/6 early systolic murmurs, no rubs, or gallops,no edema  Respiratory:  clear to auscultation bilaterally, normal work of breathing  GI: soft, nontender, nondistended, + BS MS: no deformity or atrophy Skin: warm and dry, no rash Neuro:  Strength and sensation are intact Psych: euthymic mood, full affect      Recent Labs: 09/21/2021: ALT 13 09/25/2021: Magnesium 1.8 10/20/2021: Platelets 344 04/13/2022: BUN 28; Creatinine, Ser 1.13; Hemoglobin 13.9; Potassium 5.3; Sodium 138   Lipid Panel    Component Value Date/Time   CHOL 169 09/06/2021 0956   TRIG 91 09/06/2021 0956   HDL 51 09/06/2021 0956   CHOLHDL 3.3 09/06/2021 0956   CHOLHDL 2.3 08/18/2015 0840   VLDL 21 08/18/2015 0840   LDLCALC 101 (H) 09/06/2021 0956     Other studies Reviewed: Additional studies/ records that were reviewed today with results demonstrating: .   ASSESSMENT AND PLAN:  CAD: No angina on medical therapy.  Continue clopidogrel monotherapy.  Healthy lifestyle stressed as well. Chronic systolic heart failure: resolved by 2023 echo.  Appears euvolemic. Hyperlipidemia: LDL 101. Tolerating Praluent Crestor combination.  S/p TAVR: Using SBE prophylaxis.  Clindamycin using.   Mild MR: improved after TAVR.  No CHF symptoms. COPD/lung CA: stable.  PAD: improved after aorto-bifem.  Planning for ventral hernia repair.  Okay to hold Plavix 5 days prior  To surgery   Current medicines are reviewed at length with the patient today.  The patient concerns regarding her medicines were addressed.  The following changes  have been made:  No change  Labs/ tests ordered today include:  No orders of the defined types were placed in this encounter.   Recommend 150 minutes/week of aerobic exercise Low fat, low carb, high fiber diet recommended  Disposition:   FU in 1 year   Signed, Larae Grooms, MD  06/07/2022 10:51 AM    [facility] [place], [place], [place]  [address] Phone: [phone]; Fax: [phone]

## 2022-06-08 ENCOUNTER — Other Ambulatory Visit: Payer: Self-pay | Admitting: *Deleted

## 2022-06-08 DIAGNOSIS — I70213 Atherosclerosis of native arteries of extremities with intermittent claudication, bilateral legs: Secondary | ICD-10-CM

## 2022-06-08 DIAGNOSIS — I739 Peripheral vascular disease, unspecified: Secondary | ICD-10-CM

## 2022-06-15 DIAGNOSIS — I739 Peripheral vascular disease, unspecified: Secondary | ICD-10-CM | POA: Diagnosis not present

## 2022-06-15 DIAGNOSIS — I2510 Atherosclerotic heart disease of native coronary artery without angina pectoris: Secondary | ICD-10-CM | POA: Diagnosis not present

## 2022-06-15 DIAGNOSIS — E785 Hyperlipidemia, unspecified: Secondary | ICD-10-CM | POA: Diagnosis not present

## 2022-06-15 DIAGNOSIS — I110 Hypertensive heart disease with heart failure: Secondary | ICD-10-CM | POA: Diagnosis not present

## 2022-06-15 DIAGNOSIS — J440 Chronic obstructive pulmonary disease with acute lower respiratory infection: Secondary | ICD-10-CM | POA: Diagnosis not present

## 2022-06-15 DIAGNOSIS — I5042 Chronic combined systolic (congestive) and diastolic (congestive) heart failure: Secondary | ICD-10-CM | POA: Diagnosis not present

## 2022-06-15 DIAGNOSIS — I340 Nonrheumatic mitral (valve) insufficiency: Secondary | ICD-10-CM | POA: Diagnosis not present

## 2022-06-15 DIAGNOSIS — R54 Age-related physical debility: Secondary | ICD-10-CM | POA: Diagnosis not present

## 2022-06-15 DIAGNOSIS — I10 Essential (primary) hypertension: Secondary | ICD-10-CM | POA: Diagnosis not present

## 2022-06-19 ENCOUNTER — Ambulatory Visit (HOSPITAL_COMMUNITY)
Admission: RE | Admit: 2022-06-19 | Discharge: 2022-06-19 | Disposition: A | Discharge status: Home / Self Care | Payer: PPO | Source: Ambulatory Visit | Attending: Vascular Surgery | Admitting: Vascular Surgery

## 2022-06-19 ENCOUNTER — Encounter: Payer: Self-pay | Admitting: Vascular Surgery

## 2022-06-19 ENCOUNTER — Ambulatory Visit (INDEPENDENT_AMBULATORY_CARE_PROVIDER_SITE_OTHER): Payer: PPO | Admitting: Vascular Surgery

## 2022-06-19 VITALS — BP 113/53 | HR 56 | Temp 97.9°F | Resp 20 | Ht 59.5 in | Wt 116.8 lb

## 2022-06-19 DIAGNOSIS — I739 Peripheral vascular disease, unspecified: Secondary | ICD-10-CM | POA: Diagnosis not present

## 2022-06-19 DIAGNOSIS — I70213 Atherosclerosis of native arteries of extremities with intermittent claudication, bilateral legs: Secondary | ICD-10-CM | POA: Diagnosis not present

## 2022-06-19 NOTE — Progress Notes (Signed)
[facility] [place]  ASSESSMENT / PLAN: Teresa [name] is a 72 y.o. female status post aorto-bi-femoral bypass (14 x 8mm Dacron) on 09/20/21. Recent CT scan shows ventral hernia. She has already seen Dr. Sheliah Hatch with plans for elective repair. Anastomotic stenosis seen on recent CT angiogram was not flow limiting on angiogram and direct pressure measurement above and below the lesion, and so I elected to observe this.  Recommend:  Complete cessation from all tobacco products. Blood glucose control with goal A1c < 7%. Blood pressure control with goal blood pressure < 140/90 mmHg. Lipid reduction therapy with goal LDL-C <100 mg/dL (<16 if symptomatic from PAD).  Clopidogrel  PO QD. Antilipid therapy per cardiology service.  Daily walking to and past the point of discomfort. Patient counseled to keep a log of exercise distance.  Continue surveillance of ABF and PAD. Follow up with me in 1 year with repeat ABI.  CHIEF COMPLAINT: Left leg cramping with walking  HISTORY OF PRESENT ILLNESS: Teresa [name] is a 72 y.o. female with extensive past vascular history (multiple PCI's, TAVR, congestive heart failure, etc.) who presents to clinic for evaluation of cramping discomfort in her left calf with walking.  The patient reports she can walk about the distance of 2 storefront's before severe cramping pain in her calf begins to bother her.  She has to rest for the pain to be relieved.  She has no pain at rest.  She denies any ulcers about her feet.  08/01/21: Patient returns to clinic for reevaluation.  She reports she can get about 50 yards before she has severe cramping in her left thigh and calf.  This is interfering with her ability to care for children and take care of her IADLs.  We had a long discussion about risks, benefits, and alternatives to intervention for claudication.  She desires intervention.  09/12/21: Patient returns after angiogram, CT scan, and  cardiology evaluation.  She is ready to proceed with surgery.  We again reviewed the risks, benefits, and alternatives.  We again clarified her symptomology.  She reports short distance buttock and thigh claudication which is relieved by rest.  She does not report symptoms are relieved by positional change.  She does not have symptoms when standing for long period of time.  I do think her symptoms are related to peripheral arterial disease.  03/06/22: In the clinic to evaluate for a "knot" in her abdomen.  Patient reports a lump in her left lower quadrant which is more noticeable when she stands.  It is somewhat tender.  It mostly bothers her at night when she is resting.  She does not report any obstructive symptoms.  04/03/22: patient returns to clinic to discuss CT scan. Ventral hernia confirmed. She has seen Dr. Sheliah Hatch and is planning on fixing this with open ventral hernia repair. Incidental is proximal anastomotic stenosis. Patient reports no claudication, rest pain, or ischemic ulceration.  06/19/22: Doing very well overall.  Reports no symptoms in her lower extremities.  Can walk is much as she likes.  Plans to undergo ventral hernia repair with Dr. Sheliah Hatch in the near future  VASCULAR SURGICAL HISTORY: none  VASCULAR RISK FACTORS: Negative history of stroke / transient ischemic attack. Positive history of coronary artery disease. + history of PCI.  Negative history of CABG.  Negative history of diabetes mellitus.  No recent A1c available to me. Positive history of smoking.  Not actively smoking. Positive history of hypertension.  Negative history  of chronic kidney disease.  Last GFR >60.  Positive history of chronic obstructive pulmonary disease. Not on oxygen.  FUNCTIONAL STATUS: ECOG performance status: (1) Restricted in physically strenuous activity, ambulatory and able to do work of light nature Ambulatory status: Ambulatory within the community with limits  Past Medical History:   Diagnosis Date   AAA (abdominal aortic aneurysm)    a. small by cath 2014.   Acute on chronic combined systolic and diastolic heart failure 08/19/2018   Arthritis    "across my hips; buttocks; comes w/the weather" (02/27/2013)   CAD (coronary artery disease), native coronary artery    a. 10/2012 - rotational atherectomy of LAD c/b dissection s/p PCI to LAD, LCx, LM. b. 12/2012 - s/p overlapping DES to RCA.   Cancer    Cataract    "just the beginnings on the right" (02/27/2013)   Chronic combined systolic and diastolic CHF (congestive heart failure)    COPD (chronic obstructive pulmonary disease)    Coronary artery disease    GERD (gastroesophageal reflux disease)    Hypercholesterolemia    Ischemic cardiomyopathy    a. EF 45-50%.   Mass of lung    "small tumor RUL; they are watching it" (02/27/2013)   Mitral regurgitation    Peripheral vascular disease, unspecified    Pneumonia    PONV (postoperative nausea and vomiting)    S/P TAVR (transcatheter aortic valve replacement)    23 mm Edwards Sapien 3 transcatheter heart valve placed via open left transaxillary approach    Severe aortic stenosis    Severe mitral regurgitation    Tachycardia, unspecified    Tobacco abuse     Past Surgical History:  Procedure Laterality Date   ABDOMINAL AORTOGRAM W/LOWER EXTREMITY N/A 08/03/2021   Procedure: ABDOMINAL AORTOGRAM W/LOWER EXTREMITY;  Surgeon: Cephus Shelling, MD;  Location: [facility];  Service: Cardiovascular;  Laterality: N/A;   ABDOMINAL AORTOGRAM W/LOWER EXTREMITY N/A 04/13/2022   Procedure: ABDOMINAL AORTOGRAM W/LOWER EXTREMITY;  Surgeon: Leonie Douglas, MD;  Location: [facility];  Service: Cardiovascular;  Laterality: N/A;   AORTA - BILATERAL FEMORAL ARTERY BYPASS GRAFT Bilateral 09/20/2021   Procedure: AORTA BIFEMORAL BYPASS GRAFT WITH EPIDURAL CATHETER and  aortic endartarectomey and bilateral femoral endartarectomies.;  Surgeon: Leonie Douglas, MD;   Location: [facility] OR;  Service: Vascular;  Laterality: Bilateral;   CARDIAC CATHETERIZATION  08/2018   CORONARY ANGIOPLASTY WITH STENT PLACEMENT  10/2012; 12/08/2012   "7 + 3" (02/27/2013)   ESOPHAGOGASTRODUODENOSCOPY (EGD) WITH PROPOFOL N/A 01/11/2015   Procedure: ESOPHAGOGASTRODUODENOSCOPY (EGD) WITH PROPOFOL;  Surgeon: Charolett Bumpers, MD;  Location: [facility] ENDOSCOPY;  Service: Endoscopy;  Laterality: N/A;   FLEXIBLE BRONCHOSCOPY N/A 03/02/2013   Procedure: FLEXIBLE BRONCHOSCOPY;  Surgeon: Alleen Borne, MD;  Location: [facility] OR;  Service: Thoracic;  Laterality: N/A;   FLEXIBLE SIGMOIDOSCOPY N/A 01/11/2015   Procedure: FLEXIBLE SIGMOIDOSCOPY;  Surgeon: Charolett Bumpers, MD;  Location: [facility] ENDOSCOPY;  Service: Endoscopy;  Laterality: N/A;  unable to complete colon-prep issues   GANGLION CYST EXCISION Left 1975   "wrist"   LEFT HEART CATH AND CORONARY ANGIOGRAPHY N/A 06/29/2016   Procedure: Left Heart Cath and Coronary Angiography;  Surgeon: Corky Crafts, MD;  Location: [facility] INVASIVE CV LAB;  Service: Cardiovascular;  Laterality: N/A;   LEFT HEART CATH AND CORONARY ANGIOGRAPHY N/A 08/19/2018   Procedure: LEFT HEART CATH AND CORONARY ANGIOGRAPHY;  Surgeon: Kathleene Hazel, MD;  Location: [facility];  Service: Cardiovascular;  Laterality: N/A;   LEFT HEART CATHETERIZATION WITH CORONARY ANGIOGRAM N/A 12/08/2012   Procedure: LEFT HEART CATHETERIZATION WITH CORONARY ANGIOGRAM;  Surgeon: Corky Crafts, MD;  Location: [facility] CATH LAB;  Service: Cardiovascular;  Laterality: N/A;   MOUTH SURGERY  2010?   "for bone loss" (06/24/2012)   PERCUTANEOUS CORONARY STENT INTERVENTION (PCI-S) N/A 10/09/2012   Procedure: PERCUTANEOUS CORONARY STENT INTERVENTION (PCI-S);  Surgeon: Corky Crafts, MD;  Location: [facility] CATH LAB;  Service: Cardiovascular;  Laterality: N/A;   RIGHT HEART CATH N/A 08/19/2018   Procedure: RIGHT HEART CATH;  Surgeon: Kathleene Hazel, MD;  Location: [facility];   Service: Cardiovascular;  Laterality: N/A;   TEE WITHOUT CARDIOVERSION N/A 08/22/2018   Procedure: TRANSESOPHAGEAL ECHOCARDIOGRAM (TEE);  Surgeon: Chrystie Nose, MD;  Location: [facility] ENDOSCOPY;  Service: Cardiovascular;  Laterality: N/A;   TEE WITHOUT CARDIOVERSION N/A 08/26/2018   Procedure: TRANSESOPHAGEAL ECHOCARDIOGRAM (TEE);  Surgeon: Kathleene Hazel, MD;  Location: [facility] OR;  Service: Open Heart Surgery;  Laterality: N/A;   THORACOTOMY/LOBECTOMY Right 03/02/2013   Procedure: Right Video Assisted Thoracoscopy/Thoracotomy with upper Lobectomy;  Surgeon: Alleen Borne, MD;  Location: [facility] OR;  Service: Thoracic;  Laterality: Right;  Right Lung Upper  Lobectomy    TONSILLECTOMY  1960   VIDEO BRONCHOSCOPY  09/12/2011   Procedure: VIDEO BRONCHOSCOPY WITH FLUORO;  Surgeon: Nyoka Cowden, MD;  Location: [facility] ENDOSCOPY;  Service: Cardiopulmonary;  Laterality: Bilateral;    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  Tobacco Use   Smoking status: Former    Packs/day: 0.50    Years: 40.00    Additional pack years: 0.00    Total pack years: 20.00    Types: Cigarettes    Quit date: 10/08/2012    Years since quitting: 9.7   Smokeless tobacco: Never  Vaping Use   Vaping Use: Never used  Substance and Sexual Activity   Alcohol use: No   Drug use: No   Sexual activity: Yes  Other Topics Concern   Not on file  Social History Narrative   Not on file   Social Determinants of Health   Financial Resource Strain: Low Risk  (10/07/2018)   Overall Financial Resource Strain  (CARDIA)    Difficulty of Paying Living Expenses: Not hard at all  Food Insecurity: No Food Insecurity (10/07/2018)   Hunger Vital Sign    Worried About Running Out of Food in the Last Year: Never true    Ran Out of Food in the Last Year: Never true  Transportation Needs: No Transportation Needs (10/07/2018)   PRAPARE - Administrator, Civil Service (Medical): No    Lack of Transportation (Non-Medical): No  Physical Activity: Insufficiently Active (10/07/2018)   Exercise Vital Sign    Days of Exercise per Week: 4 days    Minutes of Exercise per Session: 30 min  Stress: No Stress Concern Present (10/07/2018)   [employer]  of Occupational Health - Occupational Stress Questionnaire    Feeling of Stress : Only a little  Social Connections: Not on file  Intimate Partner Violence: Not on file    Allergies  Allergen Reactions   Azithromycin Shortness Of Breath    Other Reaction(s): burning rash/body   Cefaclor Rash and Shortness Of Breath    Other Reaction(s): breathing difficulty   Penicillins Other (See Comments)    Unknown from childhood Did it involve swelling of the face/tongue/throat, SOB, or low BP? No Did it involve sudden or severe rash/hives, skin peeling, or any reaction on the inside of your mouth or nose? No Did you need to seek medical attention at a hospital or doctor's office? Yes When did it last happen? Infancy  If all above answers are "NO", may proceed with cephalosporin use.    Septra [Sulfamethoxazole-Trimethoprim] Shortness Of Breath and Rash   Doxycycline Swelling and Other (See Comments)    Redness on the face   Lipitor [Atorvastatin] Other (See Comments)    Severe muscle aches   Morphine And Related Nausea And Vomiting and Other (See Comments)    Terrible headache   Zetia [Ezetimibe] Other (See Comments)    Severe stomach pain   Zocor [Simvastatin] Other (See Comments)    Severe muscle aches   Adhesive [Tape] Other (See Comments)    Redness  and swelling - use paper tape   Vicodin [Hydrocodone-Acetaminophen] Nausea And Vomiting    Current Outpatient Medications  Medication Sig Dispense Refill   acetaminophen (TYLENOL) 500 MG tablet Take 1,000 mg by mouth in the morning, at noon, and at bedtime.     albuterol (PROVENTIL) (2.5 MG/3ML) 0.083% nebulizer solution Take 2.5 mg by nebulization every 6 (six) hours as needed for shortness of breath or wheezing.     albuterol (VENTOLIN HFA) 108 (90 Base) MCG/ACT inhaler Inhale 2 puffs into the lungs every 6 (six) hours as needed for wheezing or shortness of breath.     Alirocumab (PRALUENT) 150 MG/ML SOAJ INJECT 1 PEN INTO THE SKIN EVERY 14 (FOURTEEN) DAYS. 6 mL 3   bisoprolol (ZEBETA) 5 MG tablet Take 0.5 tablets (2.5 mg total) by mouth daily. 45 tablet 3   clopidogrel (PLAVIX) 75 MG tablet Take 1 tablet (75 mg total) by mouth daily. 90 tablet 3   empagliflozin (JARDIANCE) 10 MG TABS tablet Take 1 tablet (10 mg total) by mouth daily. 90 tablet 3   Fluticasone-Umeclidin-Vilant (TRELEGY ELLIPTA) 100-62.5-25 MCG/INH AEPB Inhale 1 puff into the lungs daily. 60 each 11   furosemide (LASIX) 40 MG tablet TAKE 1 TABLET BY MOUTH AS NEEDED FOR FLUID OR EDEMA 90 tablet 3   Hypromellose 0.2 % SOLN Place 2 drops into both eyes 4 (four) times daily as needed (dry eyes).      lansoprazole (PREVACID) 30 MG capsule Take 30 mg by mouth in the morning.     NASAL SALINE NA Place 1 spray into the nose every 4 (four) hours as needed (congestion).     nitroGLYCERIN (NITROSTAT) 0.4 MG SL tablet DISSOLVE ONE TABLET UNDER TONGUE AS NEEDED FOR CHEST PAIN EVERY 5 MINUTES 25 tablet 6   rosuvastatin (CRESTOR) 5 MG tablet Take 1 tablet (5 mg total) by mouth 3 (three) times a week. 90 tablet 3   sacubitril-valsartan (ENTRESTO) 24-26 MG Take 1 tablet by mouth 2 (two) times daily. Please wait to begin for 36 hours after discontinuing the Losartan. 180 tablet 3   simethicone (MYLICON) 80 MG chewable tablet  Chew 2 tablets (160  mg total) by mouth every 6 (six) hours as needed for flatulence. 30 tablet 0   Spacer/Aero-Holding Chambers (AEROCHAMBER MV) inhaler Use as instructed 1 each 0   Specialty Vitamins Products ([employer] MENOPAUSE SUPPORT PO) Take 1 capsule by mouth in the morning.     spironolactone (ALDACTONE) 25 MG tablet TAKE 1 TABLET(25 MG) BY MOUTH DAILY 90 tablet 3   No current facility-administered medications for this visit.    PHYSICAL EXAM Vitals:   06/19/22 1014  BP: (!) 113/53  Pulse: (!) 56  Resp: 20  Temp: 97.9 F (36.6 C)  SpO2: 100%  Weight: 116 lb 12.8 oz (53 kg)  Height: 4' 11.5" (1.511 m)    Well-appearing woman in no acute distress Regular rate and rhythm Unlabored breathing Warm well-perfused feet  PERTINENT LABORATORY AND RADIOLOGIC DATA  Most recent CBC    Latest Ref Rng & Units 04/13/2022    8:20 AM 04/13/2022    8:11 AM 10/20/2021    2:08 PM  CBC  WBC 4.0 - 10.5 K/uL   7.0   Hemoglobin 12.0 - 15.0 g/dL 16.1  09.6  04.5   Hematocrit 36.0 - 46.0 % 41.0  43.0  40.0   Platelets 150 - 400 K/uL   344      Most recent CMP    Latest Ref Rng & Units 04/13/2022    8:25 AM 04/13/2022    8:20 AM 04/13/2022    8:11 AM  CMP  Glucose 70 - 99 mg/dL 409  811  97   BUN 8 - 23 mg/dL 28  29  46   Creatinine 0.44 - 1.00 mg/dL 9.14  7.82  9.56   Sodium 135 - 145 mmol/L 138  142  138   Potassium 3.5 - 5.1 mmol/L 5.3  5.5  7.9   Chloride 98 - 111 mmol/L 104  105  106   CO2 22 - 32 mmol/L 24     Calcium 8.9 - 10.3 mg/dL 9.5      Hgb O1H MFr Bld (%)  Date Value  09/20/2021 5.5    LDL Chol Calc (NIH)  Date Value Ref Range Status  09/06/2021 101 (H) 0 - 99 mg/dL Final   CT angiogram reviewed in detail. Ventral hernia noted. Proximal ABF stenosis noted. No other technical challenges noted in the bypass.  Rande Brunt. Lenell Antu, MD [facility] Phone Number: [phone] 06/19/2022 5:08 PM  Total time spent on preparing this encounter including  chart review, data review, collecting history, examining the patient, coordinating care for this established patient, 20 minutes.  Portions of this report may have been transcribed using voice recognition software.  Every effort has been made to ensure accuracy; however, inadvertent computerized transcription errors may still be present.

## 2022-06-20 LAB — VAS US ABI WITH/WO TBI
Left ABI: 1
Right ABI: 1.09

## 2022-06-22 ENCOUNTER — Other Ambulatory Visit: Payer: Self-pay

## 2022-06-22 ENCOUNTER — Other Ambulatory Visit (HOSPITAL_COMMUNITY): Payer: Self-pay

## 2022-07-05 ENCOUNTER — Telehealth: Payer: Self-pay

## 2022-07-05 DIAGNOSIS — K432 Incisional hernia without obstruction or gangrene: Secondary | ICD-10-CM | POA: Diagnosis not present

## 2022-07-05 DIAGNOSIS — I255 Ischemic cardiomyopathy: Secondary | ICD-10-CM | POA: Diagnosis not present

## 2022-07-05 DIAGNOSIS — I739 Peripheral vascular disease, unspecified: Secondary | ICD-10-CM | POA: Diagnosis not present

## 2022-07-05 NOTE — Telephone Encounter (Signed)
     Primary Cardiologist: Lance Muss, MD  Chart reviewed as part of pre-operative protocol coverage. Given past medical history and time since last visit, based on ACC/AHA guidelines, Teresa [name] would be at acceptable risk for the planned procedure without further cardiovascular testing.   Her RCRI is a class III risk, 6.6% risk of major cardiac event.  Her Plavix may be held for 5 days prior to her surgery.  Please resume as soon as hemostasis is achieved.   I will route this recommendation to the requesting party via Epic fax function and remove from pre-op pool.  Please call with questions.  Thomasene Ripple. Rue Valladares NP-C     07/05/2022, 2:35 PM [facility] [address] Office [phone] Fax [phone]

## 2022-07-05 NOTE — Telephone Encounter (Signed)
   Pre-operative Risk Assessment    Patient Name: Teresa [name]  DOB: 1950/07/08 MRN: [id number]      Request for Surgical Clearance    Procedure:   Incisional Hernia  Date of Surgery:  Clearance TBD                                 Surgeon:  DR. Feliciana Rossetti Surgeon's Group or Practice Name:  [facility] Phone number:  [phone] Fax number:  [phone] ATTN Santiago Glad, CMA   Type of Clearance Requested:   - Medical  - Pharmacy:  Hold Clopidogrel (Plavix) Pt will need instructions on when/if to hold    Type of Anesthesia:  General    Additional requests/questions:    Signed, Zada Finders   07/05/2022, 2:11 PM

## 2022-08-01 ENCOUNTER — Ambulatory Visit: Payer: Self-pay | Admitting: General Surgery

## 2022-08-01 MED ORDER — GENTAMICIN SULFATE 40 MG/ML IJ SOLN
5.0000 mg/kg | INTRAMUSCULAR | Status: AC
Start: 2022-08-02 — End: 2022-08-03

## 2022-08-01 MED ORDER — CLINDAMYCIN PHOSPHATE 600 MG/4ML IJ SOLN
900.0000 mg | INTRAMUSCULAR | Status: AC
Start: 2022-08-02 — End: 2022-08-03

## 2022-08-03 ENCOUNTER — Encounter (HOSPITAL_COMMUNITY): Payer: Self-pay

## 2022-08-21 NOTE — Progress Notes (Addendum)
COVID Vaccine received:  []  No [x]  Yes Date of any COVID positive Test in last 90 days: No PCP - Lorenda Ishihara MD Cardiologist - Everette Rank MD  Chest x-ray - 09/21/21  EPIC EKG -04/27/22  EPIC   Stress Test - Greater than 10 years ago ECHO - 09/20/21 EPIC Cardiac Cath - 08/19/18 EPIC  Bowel Prep - [x]  No  []   Yes ______  Pacemaker / ICD device [x]  No []  Yes   Spinal Cord Stimulator:[x]  No []  Yes       History of Sleep Apnea? [x]  No []  Yes   CPAP used?- [x]  No []  Yes    Does the patient monitor blood sugar?          [x]  No []  Yes  []  N/A  Patient has: [x]  NO Hx DM   []  Pre-DM                 []  DM1  [x]   DM2 Does patient have a [employer] or Dexacom? []  No [x]  Yes   Fasting Blood Sugar Ranges- N/A Checks Blood Sugar _____N/A GLP1 agonist / usual dose - No GLP1 instructions:  SGLT-2 inhibitors / usual dose - No SGLT-2 instructions:   Blood Thinner / Instructions:Clopidogrel  Hold starting Saturday 08/25/22 for 5 days.  Aspirin Instructions:  Comments: Instructed pt. To not take Jardiance or Entresto the day of surgery.  Activity level: Patient is able climb a flight of stairs without difficulty; [x]  No CP  [x]  No SOB, but would have ___   Patient can t perform ADLs without assistance.   Anesthesia review:ischemic cardiomyopathy CHF, PVD, CAD, Aortic stenosis, COPD TAVR 2020  Patient denies shortness of breath, fever, cough and chest pain at PAT appointment.  Patient verbalized understanding and agreement to the Pre-Surgical Instructions that were given to them at this PAT appointment. Patient was also educated of the need to review these PAT instructions again prior to his/her surgery.I reviewed the appropriate phone numbers to call if they have any and questions or concerns.

## 2022-08-21 NOTE — Patient Instructions (Addendum)
SURGICAL WAITING ROOM VISITATION  Patients having surgery or a procedure may have no more than 2 support people in the waiting area - these visitors may rotate.    Children under the age of 54 must have an adult with them who is not the patient.  Due to an increase in RSV and influenza rates and associated hospitalizations, children ages 8 and under may not visit patients in [facility] hospitals.  If the patient needs to stay at the hospital during part of their recovery, the visitor guidelines for inpatient rooms apply. Pre-op nurse will coordinate an appropriate time for 1 support person to accompany patient in pre-op.  This support person may not rotate.    Please refer to the [facility] website for the visitor guidelines for Inpatients (after your surgery is over and you are in a regular room).       Your procedure is scheduled on: 08/30/22   Report to [facility] Main Entrance    Report to admitting at  9:45 AM   Call this number if you have problems the morning of surgery [phone]   Do not eat food :After Midnight.   After Midnight you may have the following liquids until 9 AM DAY OF SURGERY  Water Non-Citrus Juices (without pulp, NO RED-Apple, White grape, White cranberry) Black Coffee (NO MILK/CREAM OR CREAMERS, sugar ok)  Clear Tea (NO MILK/CREAM OR CREAMERS, sugar ok) regular and decaf                             Plain Jell-O (NO RED)                                           Fruit ices (not with fruit pulp, NO RED)                                     Popsicles (NO RED)                                                               Sports drinks like Gatorade (NO RED)                The day of surgery:  Drink ONE (1) Pre-Surgery Clear Ensure at 9 AM the morning of surgery. Drink in one sitting. Do not sip.  This drink was given to you during your hospital  pre-op appointment visit. Nothing else to drink after completing the  Pre-Surgery Clear Ensure  .    FOLLOW BOWEL PREP AND ANY ADDITIONAL PRE OP INSTRUCTIONS YOU RECEIVED FROM YOUR SURGEON'S OFFICE!!!     Oral Hygiene is also important to reduce your risk of infection.                                    Remember - BRUSH YOUR TEETH THE MORNING OF SURGERY WITH YOUR REGULAR TOOTHPASTE  DENTURES WILL BE REMOVED PRIOR TO SURGERY PLEASE DO NOT APPLY "Poly grip" OR ADHESIVES!!!  Do NOT smoke after Midnight   Take these medicines the morning of surgery with A SIP OF WATER: Tylenol, Zebeta, Famotidine, Prevacid, Simethicone   Bring CPAP mask and tubing day of surgery.                              You may not have any metal on your body including hair pins, jewelry, and body piercing             Do not wear make-up, lotions, powders, perfumes, or deodorant  Do not wear nail polish including gel and S&S, artificial/acrylic nails, or any other type of covering on natural nails including finger and toenails. If you have artificial nails, gel coating, etc. that needs to be removed by a nail salon please have this removed prior to surgery or surgery may need to be canceled/ delayed if the surgeon/ anesthesia feels like they are unable to be safely monitored.   Do not shave  48 hours prior to surgery.    Do not bring valuables to the hospital. [facility] IS NOT             RESPONSIBLE   FOR VALUABLES.   Contacts, glasses, dentures or bridgework may not be worn into surgery.   Bring small overnight bag day of surgery.   DO NOT BRING YOUR HOME MEDICATIONS TO THE HOSPITAL. PHARMACY WILL DISPENSE MEDICATIONS LISTED ON YOUR MEDICATION LIST TO YOU DURING YOUR ADMISSION IN THE HOSPITAL!    Patients discharged on the day of surgery will not be allowed to drive home.  Someone NEEDS to stay with you for the first 24 hours after anesthesia.   Special Instructions: Bring a copy of your healthcare power of attorney and living will documents the day of surgery if you haven't scanned them before.               Please read over the following fact sheets you were given: IF YOU HAVE QUESTIONS ABOUT YOUR PRE-OP INSTRUCTIONS PLEASE CALL [phone]   If you received a COVID test during your pre-op visit  it is requested that you wear a mask when out in public, stay away from anyone that may not be feeling well and notify your surgeon if you develop symptoms. If you test positive for Covid or have been in contact with anyone that has tested positive in the last 10 days please notify you surgeon.    [place] - Preparing for Surgery Before surgery, you can play an important role.  Because skin is not sterile, your skin needs to be as free of germs as possible.  You can reduce the number of germs on your skin by washing with CHG (chlorahexidine gluconate) soap before surgery.  CHG is an antiseptic cleaner which kills germs and bonds with the skin to continue killing germs even after washing. Please DO NOT use if you have an allergy to CHG or antibacterial soaps.  If your skin becomes reddened/irritated stop using the CHG and inform your nurse when you arrive at Short Stay. Do not shave (including legs and underarms) for at least 48 hours prior to the first CHG shower.  You may shave your face/neck.  Please follow these instructions carefully:  1.  Shower with CHG Soap the night before surgery and the  morning of surgery.  2.  If you choose to wash your hair, wash your hair first as usual with your normal  shampoo.  3.  After you shampoo, rinse your hair and body thoroughly to remove the shampoo.                             4.  Use CHG as you would any other liquid soap.  You can apply chg directly to the skin and wash.  Gently with a scrungie or clean washcloth.  5.  Apply the CHG Soap to your body ONLY FROM THE NECK DOWN.   Do   not use on face/ open                           Wound or open sores. Avoid contact with eyes, ears mouth and   genitals (private parts).                       Wash face,   Genitals (private parts) with your normal soap.             6.  Wash thoroughly, paying special attention to the area where your    surgery  will be performed.  7.  Thoroughly rinse your body with warm water from the neck down.  8.  DO NOT shower/wash with your normal soap after using and rinsing off the CHG Soap.                9.  Pat yourself dry with a clean towel.            10.  Wear clean pajamas.            11.  Place clean sheets on your bed the night of your first shower and do not  sleep with pets. Day of Surgery : Do not apply any lotions/deodorants the morning of surgery.  Please wear clean clothes to the hospital/surgery center.  FAILURE TO FOLLOW THESE INSTRUCTIONS MAY RESULT IN THE CANCELLATION OF YOUR SURGERY  PATIENT SIGNATURE_________________________________  NURSE SIGNATURE__________________________________  ________________________________________________________________________

## 2022-08-24 ENCOUNTER — Encounter (HOSPITAL_COMMUNITY)
Admission: RE | Admit: 2022-08-24 | Discharge: 2022-08-24 | Disposition: A | Discharge status: Home / Self Care | Payer: PPO | Source: Ambulatory Visit | Attending: General Surgery | Admitting: General Surgery

## 2022-08-24 ENCOUNTER — Encounter (HOSPITAL_COMMUNITY): Payer: Self-pay

## 2022-08-24 ENCOUNTER — Other Ambulatory Visit: Payer: Self-pay

## 2022-08-24 VITALS — BP 114/58 | HR 57 | Temp 98.3°F | Resp 16 | Ht 59.0 in | Wt 116.0 lb

## 2022-08-24 DIAGNOSIS — K219 Gastro-esophageal reflux disease without esophagitis: Secondary | ICD-10-CM | POA: Diagnosis not present

## 2022-08-24 DIAGNOSIS — I110 Hypertensive heart disease with heart failure: Secondary | ICD-10-CM | POA: Diagnosis not present

## 2022-08-24 DIAGNOSIS — I10 Essential (primary) hypertension: Secondary | ICD-10-CM

## 2022-08-24 DIAGNOSIS — J449 Chronic obstructive pulmonary disease, unspecified: Secondary | ICD-10-CM | POA: Diagnosis not present

## 2022-08-24 DIAGNOSIS — Z01812 Encounter for preprocedural laboratory examination: Secondary | ICD-10-CM | POA: Diagnosis not present

## 2022-08-24 DIAGNOSIS — I5020 Unspecified systolic (congestive) heart failure: Secondary | ICD-10-CM | POA: Insufficient documentation

## 2022-08-24 DIAGNOSIS — I739 Peripheral vascular disease, unspecified: Secondary | ICD-10-CM | POA: Diagnosis not present

## 2022-08-24 DIAGNOSIS — I2510 Atherosclerotic heart disease of native coronary artery without angina pectoris: Secondary | ICD-10-CM | POA: Insufficient documentation

## 2022-08-24 DIAGNOSIS — Z952 Presence of prosthetic heart valve: Secondary | ICD-10-CM | POA: Diagnosis not present

## 2022-08-24 DIAGNOSIS — K432 Incisional hernia without obstruction or gangrene: Secondary | ICD-10-CM | POA: Insufficient documentation

## 2022-08-24 DIAGNOSIS — I255 Ischemic cardiomyopathy: Secondary | ICD-10-CM | POA: Diagnosis not present

## 2022-08-24 DIAGNOSIS — Z01818 Encounter for other preprocedural examination: Secondary | ICD-10-CM

## 2022-08-24 LAB — CBC
HCT: 44.5 % (ref 36.0–46.0)
Hemoglobin: 14 g/dL (ref 12.0–15.0)
MCH: 30 pg (ref 26.0–34.0)
MCHC: 31.5 g/dL (ref 30.0–36.0)
MCV: 95.5 fL (ref 80.0–100.0)
Platelets: 276 10*3/uL (ref 150–400)
RBC: 4.66 MIL/uL (ref 3.87–5.11)
RDW: 13.3 % (ref 11.5–15.5)
WBC: 6.6 10*3/uL (ref 4.0–10.5)
nRBC: 0 % (ref 0.0–0.2)

## 2022-08-24 LAB — BASIC METABOLIC PANEL
Anion gap: 9 (ref 5–15)
BUN: 29 mg/dL — ABNORMAL HIGH (ref 8–23)
CO2: 25 mmol/L (ref 22–32)
Calcium: 9.4 mg/dL (ref 8.9–10.3)
Chloride: 102 mmol/L (ref 98–111)
Creatinine, Ser: 1.15 mg/dL — ABNORMAL HIGH (ref 0.44–1.00)
GFR, Estimated: 52 mL/min — ABNORMAL LOW (ref >60)
Glucose, Bld: 99 mg/dL (ref 70–99)
Potassium: 5.2 mmol/L — ABNORMAL HIGH (ref 3.5–5.1)
Sodium: 136 mmol/L (ref 135–145)

## 2022-08-27 NOTE — Progress Notes (Signed)
Anesthesia Chart Review   Case: [id number] Date/Time: 08/30/22 1145   Procedure: INCISIONAL HERNIA REPAIR WITH MESH,  BILATERAL MYOFASCIAL RELEASE   Anesthesia type: General   Pre-op diagnosis: INCISIONAL HERNIA, ISCHEMIC CARDIOMYOPATHY, PERIPHERAL VASCULAR DISEASE   Location: [facility] ROOM 01 / [facility]   Surgeons: Kinsinger, [name], MD       DISCUSSION:71 y.o. former smoker with h/o GERD, COPD, lung CA s/p RUL resection 2014, CAD, systolic HF with recovered EF, s/p TAVR, status post aorto-bi-femoral bypass (14 x 8mm Dacron) on 09/20/21 (pt last seen by vascular surgeon 06/19/2022, stable at this visit), incisional hernia scheduled for above procedure 08/30/2022 with Dr. Feliciana Rossetti.   Pt last seen by cardiology 06/07/2022. Per OV note pt euvolemic, COPD stable.  Per Dr. Eldridge Dace, "Okay to hold Plavix 5 days prior to surgery." 1 year follow up recommended.  Per cardiology preoperative evaluation 07/05/22, "Chart reviewed as part of pre-operative protocol coverage. Given past medical history and time since last visit, based on ACC/AHA guidelines, Teresa [name] would be at acceptable risk for the planned procedure without further cardiovascular testing.  Her RCRI is a class III risk, 6.6% risk of major cardiac event. Her Plavix may be held for 5 days prior to her surgery.  Please resume as soon as hemostasis is achieved."  Anticipate pt can proceed with planned procedure barring acute status change.   VS: BP (!) 114/58   Pulse (!) 57   Temp 36.8 C (Oral)   Resp 16   Ht 4\' 11"  (1.499 m)   Wt 52.6 kg   LMP 10/04/1999   SpO2 100%   BMI 23.43 kg/m   PROVIDERS: Lorenda Ishihara, MD is PCP   Cardiologist - Everette Rank MD  LABS: Labs reviewed: Acceptable for surgery. (all labs ordered are listed, but only abnormal results are displayed)  Labs Reviewed  BASIC METABOLIC PANEL - Abnormal; Notable for the following components:      Result Value   Potassium 5.2 (*)    BUN 29 (*)     Creatinine, Ser 1.15 (*)    GFR, Estimated 52 (*)    All other components within normal limits  CBC     IMAGES:   EKG:   CV: Echo 09/20/2021 1. Left ventricular ejection fraction, by estimation, is 55 to 60%. The  left ventricle has normal function. The left ventricle demonstrates  regional wall motion abnormalities (see scoring diagram/findings for  description). 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  There is mild concentric left ventricular hypertrophy. Left ventricular  diastolic parameters are consistent with Grade I diastolic dysfunction  (impaired relaxation). No LV thrombus present.   2. Right ventricular systolic function is normal. The right ventricular  size is normal. There is mildly elevated pulmonary artery systolic  pressure.   3. The mitral valve is abnormal. Mild mitral valve regurgitation.  Moderate mitral annular calcification.   4. The aortic valve has been repaired/replaced. There is a 23 mm Sapien  prosthetic (TAVR) valve present in the aortic position. Echo findings are  consistent with normal structure and function of the aortic valve  prosthesis. Aortic valve mean gradient  measures 16.0 mmHg. Aortic valve Vmax measures 2.78 m/s. DI 0.7. There is  trivial perivalvular leak present at the 9 o'clock position on SAX view.   5. The inferior vena cava is normal in size with greater than 50%  respiratory variability, suggesting right atrial pressure of 3 mmHg.  Past Medical History:  Diagnosis Date   AAA (  abdominal aortic aneurysm) (HCC)    a. small by cath 2014.   Acute on chronic combined systolic and diastolic heart failure (HCC) 08/19/2018   Arthritis    "across my hips; buttocks; comes w/the weather" (02/27/2013)   CAD (coronary artery disease), native coronary artery    a. 10/2012 - rotational atherectomy of LAD c/b dissection s/p PCI to LAD, LCx, LM. b. 12/2012 - s/p overlapping DES to RCA.   Cancer [facility])    Cataract    "just the beginnings on the right"  (02/27/2013)   Chronic combined systolic and diastolic CHF (congestive heart failure) (HCC)    COPD (chronic obstructive pulmonary disease) (HCC)    Coronary artery disease    GERD (gastroesophageal reflux disease)    Hypercholesterolemia    Ischemic cardiomyopathy    a. EF 45-50%.   Mass of lung    "small tumor RUL; they are watching it" (02/27/2013)   Mitral regurgitation    Peripheral vascular disease, unspecified (HCC)    Pneumonia    S/P TAVR (transcatheter aortic valve replacement)    23 mm Edwards Sapien 3 transcatheter heart valve placed via open left transaxillary approach    Severe aortic stenosis    Severe mitral regurgitation    Tachycardia, unspecified    Tobacco abuse     Past Surgical History:  Procedure Laterality Date   ABDOMINAL AORTOGRAM W/LOWER EXTREMITY N/A 08/03/2021   Procedure: ABDOMINAL AORTOGRAM W/LOWER EXTREMITY;  Surgeon: Cephus Shelling, MD;  Location: [facility] INVASIVE CV LAB;  Service: Cardiovascular;  Laterality: N/A;   ABDOMINAL AORTOGRAM W/LOWER EXTREMITY N/A 04/13/2022   Procedure: ABDOMINAL AORTOGRAM W/LOWER EXTREMITY;  Surgeon: Leonie Douglas, MD;  Location: [facility] INVASIVE CV LAB;  Service: Cardiovascular;  Laterality: N/A;   AORTA - BILATERAL FEMORAL ARTERY BYPASS GRAFT Bilateral 09/20/2021   Procedure: AORTA BIFEMORAL BYPASS GRAFT WITH EPIDURAL CATHETER and  aortic endartarectomey and bilateral femoral endartarectomies.;  Surgeon: Leonie Douglas, MD;  Location[facility] OR;  Service: Vascular;  Laterality: Bilateral;   CARDIAC CATHETERIZATION  08/2018   CORONARY ANGIOPLASTY WITH STENT PLACEMENT  10/2012; 12/08/2012   "7 + 3" (02/27/2013)   ESOPHAGOGASTRODUODENOSCOPY (EGD) WITH PROPOFOL N/A 01/11/2015   Procedure: ESOPHAGOGASTRODUODENOSCOPY (EGD) WITH PROPOFOL;  Surgeon: Charolett Bumpers, MD;  Location[facility]OS[facility];  Service: Endoscopy;  Laterality: N/A;   FLEXIBLE BRONCHOSCOPY N/A 03/02/2013   Procedure: FLEXIBLE BRONCHOSCOPY;  Surgeon: Alleen Borne,  MD;  Location: [facility] OR;  Service: Thoracic;  Laterality: N/A;   FLEXIBLE SIGMOIDOSCOPY N/A 01/11/2015   Procedure: FLEXIBLE SIGMOIDOSCOPY;  Surgeon: Charolett Bumpers, MD;  Location: [facility];  Service: Endoscopy;  Laterality: N/A;  unable to complete colon-prep issues   GANGLION CYST EXCISION Left 1975   "wrist"   LEFT HEART CATH AND CORONARY ANGIOGRAPHY N/A 06/29/2016   Procedure: Left Heart Cath and Coronary Angiography;  Surgeon: Corky Crafts, MD;  Location: [facility] INVASIVE CV LAB;  Service: Cardiovascular;  Laterality: N/A;   LEFT HEART CATH AND CORONARY ANGIOGRAPHY N/A 08/19/2018   Procedure: LEFT HEART CATH AND CORONARY ANGIOGRAPHY;  Surgeon: Kathleene Hazel, MD;  [facility]: [facility]VAS[facility] CV LAB;  Service: Cardiovascular;  Laterality: N/A;   LEFT HEART CATHETERIZATION WITH CORONARY ANGIOGRAM N/A 12/08/2012   Procedure: LEFT HEART CATHETERIZATION WITH CORONARY ANGIOGRAM;  Surgeon: Corky Crafts, MD;  Location: [facility] CATH LAB;  Service: Cardiovascular;  Laterality: N/A;   MOUTH SURGERY  2010?   "for bone loss" (06/24/2012)   PERCUTANEOUS CORONARY STENT INTERVENTION (PCI-S) N/A 10/09/2012  Procedure: PERCUTANEOUS CORONARY STENT INTERVENTION (PCI-S);  Surgeon: Corky Crafts, MD;  Location: [facility] CATH LAB;  Service: Cardiovascular;  Laterality: N/A;   RIGHT HEART CATH N/A 08/19/2018   Procedure: RIGHT HEART CATH;  Surgeon: Kathleene Hazel, MD;  [facility] INVASIVE CV LAB;  Service: Cardiovascular;  Laterality: N/A;   TEE WITHOUT CARDIOVERSION N/A 08/22/2018   Procedure: TRANSESOPHAGEAL ECHOCARDIOGRAM (TEE);  Surgeon: Chrystie Nose, MD;  Location: [facility] ENDOSCOPY;  Service: Cardiovascular;  Laterality: N/A;   TEE WITHOUT CARDIOVERSION N/A 08/26/2018   Procedure: TRANSESOPHAGEAL ECHOCARDIOGRAM (TEE);  Surgeon: Kathleene Hazel, MD;  Location: [facility] OR;  Service: Open Heart Surgery;  Laterality: N/A;   THORACOTOMY/LOBECTOMY Right 03/02/2013   Procedure: Right Video Assisted  Thoracoscopy/Thoracotomy with upper Lobectomy;  Surgeon: Alleen Borne, MD;  Location: [facility] OR;  Service: Thoracic;  Laterality: Right;  Right Lung Upper  Lobectomy    TONSILLECTOMY  1960   VIDEO BRONCHOSCOPY  09/12/2011   Procedure: VIDEO BRONCHOSCOPY WITH FLUORO;  Surgeon: Nyoka Cowden, MD;  [facility];  Service: Cardiopulmonary;  Laterality: Bilateral;    MEDICATIONS:  acetaminophen (TYLENOL) 500 MG tablet   albuterol (PROVENTIL) (2.5 MG/3ML) 0.083% nebulizer solution   albuterol (VENTOLIN HFA) 108 (90 Base) MCG/ACT inhaler   Alirocumab (PRALUENT) 150 MG/ML SOAJ   bisoprolol (ZEBETA) 5 MG tablet   clindamycin (CLEOCIN) 150 MG capsule   clopidogrel (PLAVIX) 75 MG tablet   empagliflozin (JARDIANCE) 10 MG TABS tablet   famotidine (ZANTAC 360) 10 MG tablet   Fluticasone-Umeclidin-Vilant (TRELEGY ELLIPTA) 100-62.5-25 MCG/INH AEPB   furosemide (LASIX) 40 MG tablet   Hypromellose 0.2 % SOLN   lansoprazole (PREVACID) 30 MG capsule   NASAL SALINE NA   nitroGLYCERIN (NITROSTAT) 0.4 MG SL tablet   rosuvastatin (CRESTOR) 5 MG tablet   sacubitril-valsartan (ENTRESTO) 24-26 MG   simethicone (MYLICON) 80 MG chewable tablet   Spacer/Aero-Holding Chambers (AEROCHAMBER MV) inhaler   Specialty Vitamins Products ([employer] MENOPAUSE SUPPORT PO)   spironolactone (ALDACTONE) 25 MG tablet   No current facility-administered medications for this encounter.    Jodell Cipro Ward, PA-C [facility] Pre-Surgical Testing [phone]

## 2022-08-27 NOTE — Anesthesia Preprocedure Evaluation (Signed)
Anesthesia Evaluation  Patient identified by MRN, date of birth, ID band Patient awake    Reviewed: Allergy & Precautions, NPO status , Patient's Chart, lab work & pertinent test results  History of Anesthesia Complications (+) PONV and history of anesthetic complications  Airway Mallampati: II  TM Distance: >3 FB Neck ROM: Full    Dental no notable dental hx.    Pulmonary COPD,  COPD inhaler, former smoker   Pulmonary exam normal        Cardiovascular + CAD, + Cardiac Stents, + Peripheral Vascular Disease and +CHF   Rhythm:Regular Rate:Normal  S/p TAVR 2020   Neuro/Psych negative neurological ROS  negative psych ROS   GI/Hepatic Neg liver ROS,GERD  Medicated,,  Endo/Other  negative endocrine ROS    Renal/GU negative Renal ROS  negative genitourinary   Musculoskeletal  (+) Arthritis , Osteoarthritis,    Abdominal Normal abdominal exam  (+)   Peds  Hematology negative hematology ROS (+)   Anesthesia Other Findings   Reproductive/Obstetrics                             Anesthesia Physical Anesthesia Plan  ASA: 3  Anesthesia Plan: General   Post-op Pain Management: Tylenol PO (pre-op)* and Dilaudid IV   Induction: Intravenous  PONV Risk Score and Plan: 4 or greater and Ondansetron, Dexamethasone, Midazolam, Droperidol and Treatment may vary due to age or medical condition  Airway Management Planned: Oral ETT  Additional Equipment: None  Intra-op Plan:   Post-operative Plan: Extubation in OR  Informed Consent: I have reviewed the patients History and Physical, chart, labs and discussed the procedure including the risks, benefits and alternatives for the proposed anesthesia with the patient or authorized representative who has indicated his/her understanding and acceptance.     Dental advisory given  Plan Discussed with: CRNA  Anesthesia Plan Comments: (See PAT note  08/24/2022)        Anesthesia Quick Evaluation

## 2022-08-30 ENCOUNTER — Encounter (HOSPITAL_COMMUNITY): Payer: Self-pay | Admitting: General Surgery

## 2022-08-30 ENCOUNTER — Inpatient Hospital Stay (HOSPITAL_COMMUNITY): Payer: PPO | Admitting: Anesthesiology

## 2022-08-30 ENCOUNTER — Observation Stay (HOSPITAL_COMMUNITY)
Admission: RE | Admit: 2022-08-30 | Discharge: 2022-08-31 | Disposition: A | Discharge status: Home / Self Care | Payer: PPO | Attending: General Surgery | Admitting: General Surgery

## 2022-08-30 ENCOUNTER — Encounter (HOSPITAL_COMMUNITY)
Admission: RE | Disposition: A | Discharge status: Home / Self Care | Payer: Self-pay | Source: Home / Self Care | Attending: General Surgery

## 2022-08-30 ENCOUNTER — Other Ambulatory Visit: Payer: Self-pay

## 2022-08-30 ENCOUNTER — Inpatient Hospital Stay (HOSPITAL_COMMUNITY): Payer: PPO | Admitting: Physician Assistant

## 2022-08-30 DIAGNOSIS — Z79899 Other long term (current) drug therapy: Secondary | ICD-10-CM | POA: Diagnosis not present

## 2022-08-30 DIAGNOSIS — Z01818 Encounter for other preprocedural examination: Secondary | ICD-10-CM

## 2022-08-30 DIAGNOSIS — K432 Incisional hernia without obstruction or gangrene: Principal | ICD-10-CM | POA: Diagnosis present

## 2022-08-30 DIAGNOSIS — Z87891 Personal history of nicotine dependence: Secondary | ICD-10-CM | POA: Insufficient documentation

## 2022-08-30 DIAGNOSIS — I739 Peripheral vascular disease, unspecified: Secondary | ICD-10-CM | POA: Diagnosis not present

## 2022-08-30 DIAGNOSIS — J449 Chronic obstructive pulmonary disease, unspecified: Secondary | ICD-10-CM

## 2022-08-30 DIAGNOSIS — I10 Essential (primary) hypertension: Principal | ICD-10-CM

## 2022-08-30 DIAGNOSIS — I509 Heart failure, unspecified: Secondary | ICD-10-CM | POA: Diagnosis not present

## 2022-08-30 DIAGNOSIS — Z7902 Long term (current) use of antithrombotics/antiplatelets: Secondary | ICD-10-CM | POA: Diagnosis not present

## 2022-08-30 DIAGNOSIS — I2510 Atherosclerotic heart disease of native coronary artery without angina pectoris: Secondary | ICD-10-CM | POA: Diagnosis not present

## 2022-08-30 HISTORY — PX: INCISIONAL HERNIA REPAIR: SHX193

## 2022-08-30 LAB — CBC
HCT: 37.5 % (ref 36.0–46.0)
Hemoglobin: 12.2 g/dL (ref 12.0–15.0)
MCH: 30.8 pg (ref 26.0–34.0)
MCHC: 32.5 g/dL (ref 30.0–36.0)
MCV: 94.7 fL (ref 80.0–100.0)
Platelets: 192 10*3/uL (ref 150–400)
RBC: 3.96 MIL/uL (ref 3.87–5.11)
RDW: 13.3 % (ref 11.5–15.5)
WBC: 8.9 10*3/uL (ref 4.0–10.5)
nRBC: 0 % (ref 0.0–0.2)

## 2022-08-30 LAB — GLUCOSE, CAPILLARY
Glucose-Capillary: 115 mg/dL — ABNORMAL HIGH (ref 70–99)
Glucose-Capillary: 156 mg/dL — ABNORMAL HIGH (ref 70–99)

## 2022-08-30 LAB — CREATININE, SERUM
Creatinine, Ser: 0.91 mg/dL (ref 0.44–1.00)
GFR, Estimated: >60 mL/min (ref >60)

## 2022-08-30 SURGERY — REPAIR, HERNIA, INCISIONAL
Anesthesia: General

## 2022-08-30 MED ORDER — PROMETHAZINE HCL 25 MG/ML IJ SOLN: 6.2500 mg | INTRAMUSCULAR | Status: DC | PRN

## 2022-08-30 MED ORDER — BUPIVACAINE LIPOSOME 1.3 % IJ SUSP
INTRAMUSCULAR | Status: DC | PRN
  Administered 2022-08-30: 50 mL

## 2022-08-30 MED ORDER — SPIRONOLACTONE 25 MG PO TABS
25.0000 mg | ORAL_TABLET | Freq: Every day | ORAL | Status: DC
  Administered 2022-08-31: 25 mg via ORAL
  Filled 2022-08-30: qty 1

## 2022-08-30 MED ORDER — POLYETHYLENE GLYCOL 3350 17 G PO PACK: 17.0000 g | PACK | Freq: Every day | ORAL | Status: DC | PRN

## 2022-08-30 MED ORDER — BUPIVACAINE LIPOSOME 1.3 % IJ SUSP: 20.0000 mL | Freq: Once | INTRAMUSCULAR | Status: DC

## 2022-08-30 MED ORDER — ORAL CARE MOUTH RINSE: 15.0000 mL | Freq: Once | OROMUCOSAL | Status: AC

## 2022-08-30 MED ORDER — PROPOFOL 10 MG/ML IV BOLUS
INTRAVENOUS | Status: AC
  Filled 2022-08-30: qty 20

## 2022-08-30 MED ORDER — FENTANYL CITRATE (PF) 100 MCG/2ML IJ SOLN
INTRAMUSCULAR | Status: DC | PRN
  Administered 2022-08-30: 25 ug via INTRAVENOUS
  Administered 2022-08-30: 50 ug via INTRAVENOUS

## 2022-08-30 MED ORDER — METOPROLOL TARTRATE 5 MG/5ML IV SOLN: 5.0000 mg | Freq: Four times a day (QID) | INTRAVENOUS | Status: DC | PRN

## 2022-08-30 MED ORDER — ONDANSETRON 4 MG PO TBDP: 4.0000 mg | ORAL_TABLET | Freq: Four times a day (QID) | ORAL | Status: DC | PRN

## 2022-08-30 MED ORDER — LIDOCAINE HCL (CARDIAC) PF 100 MG/5ML IV SOSY
PREFILLED_SYRINGE | INTRAVENOUS | Status: DC | PRN
  Administered 2022-08-30: 60 mg via INTRAVENOUS

## 2022-08-30 MED ORDER — DEXAMETHASONE SODIUM PHOSPHATE 4 MG/ML IJ SOLN
INTRAMUSCULAR | Status: DC | PRN
  Administered 2022-08-30: 10 mg via INTRAVENOUS

## 2022-08-30 MED ORDER — ONDANSETRON HCL 4 MG/2ML IJ SOLN: 4.0000 mg | Freq: Four times a day (QID) | INTRAMUSCULAR | Status: DC | PRN

## 2022-08-30 MED ORDER — ENSURE PRE-SURGERY PO LIQD: 296.0000 mL | Freq: Once | ORAL | Status: DC

## 2022-08-30 MED ORDER — OXYCODONE HCL 5 MG PO TABS: 5.0000 mg | ORAL_TABLET | Freq: Once | ORAL | Status: DC | PRN

## 2022-08-30 MED ORDER — EPHEDRINE 5 MG/ML INJ
INTRAVENOUS | Status: AC
  Filled 2022-08-30: qty 5

## 2022-08-30 MED ORDER — PHENYLEPHRINE 80 MCG/ML (10ML) SYRINGE FOR IV PUSH (FOR BLOOD PRESSURE SUPPORT)
PREFILLED_SYRINGE | INTRAVENOUS | Status: DC | PRN
  Administered 2022-08-30 (×2): 80 ug via INTRAVENOUS
  Administered 2022-08-30 (×3): 160 ug via INTRAVENOUS
  Administered 2022-08-30: 80 ug via INTRAVENOUS

## 2022-08-30 MED ORDER — LIDOCAINE HCL (PF) 2 % IJ SOLN
INTRAMUSCULAR | Status: AC
  Filled 2022-08-30: qty 5

## 2022-08-30 MED ORDER — FENTANYL CITRATE (PF) 100 MCG/2ML IJ SOLN
INTRAMUSCULAR | Status: AC
  Filled 2022-08-30: qty 2

## 2022-08-30 MED ORDER — ONDANSETRON HCL 4 MG/2ML IJ SOLN
INTRAMUSCULAR | Status: DC | PRN
  Administered 2022-08-30: 4 mg via INTRAVENOUS

## 2022-08-30 MED ORDER — BUPIVACAINE HCL (PF) 0.25 % IJ SOLN
INTRAMUSCULAR | Status: AC
  Filled 2022-08-30: qty 30

## 2022-08-30 MED ORDER — FAMOTIDINE 20 MG PO TABS: 10.0000 mg | ORAL_TABLET | Freq: Every day | ORAL | Status: DC | PRN

## 2022-08-30 MED ORDER — SACUBITRIL-VALSARTAN 24-26 MG PO TABS
1.0000 | ORAL_TABLET | Freq: Two times a day (BID) | ORAL | Status: DC
  Administered 2022-08-30 – 2022-08-31 (×2): 1 via ORAL
  Filled 2022-08-30 (×2): qty 1

## 2022-08-30 MED ORDER — HYDROMORPHONE HCL 1 MG/ML IJ SOLN
INTRAMUSCULAR | Status: AC
  Filled 2022-08-30: qty 1

## 2022-08-30 MED ORDER — ONDANSETRON HCL 4 MG/2ML IJ SOLN
INTRAMUSCULAR | Status: AC
  Filled 2022-08-30: qty 2

## 2022-08-30 MED ORDER — UMECLIDINIUM BROMIDE 62.5 MCG/ACT IN AEPB
1.0000 | INHALATION_SPRAY | Freq: Every day | RESPIRATORY_TRACT | Status: DC
  Filled 2022-08-30: qty 7

## 2022-08-30 MED ORDER — FLUTICASONE FUROATE-VILANTEROL 100-25 MCG/ACT IN AEPB
1.0000 | INHALATION_SPRAY | Freq: Every day | RESPIRATORY_TRACT | Status: DC
  Filled 2022-08-30: qty 28

## 2022-08-30 MED ORDER — ALBUTEROL SULFATE (2.5 MG/3ML) 0.083% IN NEBU: 2.5000 mg | INHALATION_SOLUTION | Freq: Four times a day (QID) | RESPIRATORY_TRACT | Status: DC | PRN

## 2022-08-30 MED ORDER — ACETAMINOPHEN 500 MG PO TABS
1000.0000 mg | ORAL_TABLET | ORAL | Status: AC
  Administered 2022-08-30: 1000 mg via ORAL
  Filled 2022-08-30: qty 2

## 2022-08-30 MED ORDER — NITROGLYCERIN 0.4 MG SL SUBL: 0.4000 mg | SUBLINGUAL_TABLET | SUBLINGUAL | Status: DC | PRN

## 2022-08-30 MED ORDER — PROPOFOL 10 MG/ML IV BOLUS
INTRAVENOUS | Status: DC | PRN
  Administered 2022-08-30: 120 mg via INTRAVENOUS

## 2022-08-30 MED ORDER — CHLORHEXIDINE GLUCONATE 0.12 % MT SOLN
15.0000 mL | Freq: Once | OROMUCOSAL | Status: AC
  Administered 2022-08-30: 15 mL via OROMUCOSAL

## 2022-08-30 MED ORDER — AMISULPRIDE (ANTIEMETIC) 5 MG/2ML IV SOLN: 10.0000 mg | Freq: Once | INTRAVENOUS | Status: DC | PRN

## 2022-08-30 MED ORDER — MIDAZOLAM HCL 5 MG/5ML IJ SOLN
INTRAMUSCULAR | Status: DC | PRN
  Administered 2022-08-30: 2 mg via INTRAVENOUS

## 2022-08-30 MED ORDER — HYDROMORPHONE HCL 1 MG/ML IJ SOLN
0.2500 mg | INTRAMUSCULAR | Status: DC | PRN
  Administered 2022-08-30: 0.25 mg via INTRAVENOUS
  Administered 2022-08-30: 0.5 mg via INTRAVENOUS

## 2022-08-30 MED ORDER — ACETAMINOPHEN 500 MG PO TABS
1000.0000 mg | ORAL_TABLET | Freq: Four times a day (QID) | ORAL | Status: DC
  Administered 2022-08-30 – 2022-08-31 (×3): 1000 mg via ORAL
  Filled 2022-08-30 (×3): qty 2

## 2022-08-30 MED ORDER — CIPROFLOXACIN IN D5W 400 MG/200ML IV SOLN
400.0000 mg | INTRAVENOUS | Status: AC
  Administered 2022-08-30: 400 mg via INTRAVENOUS
  Filled 2022-08-30: qty 200

## 2022-08-30 MED ORDER — BISOPROLOL FUMARATE 5 MG PO TABS
2.5000 mg | ORAL_TABLET | Freq: Every day | ORAL | Status: DC
  Administered 2022-08-31: 2.5 mg via ORAL
  Filled 2022-08-30: qty 1

## 2022-08-30 MED ORDER — BUPIVACAINE LIPOSOME 1.3 % IJ SUSP
INTRAMUSCULAR | Status: AC
  Filled 2022-08-30: qty 20

## 2022-08-30 MED ORDER — SUGAMMADEX SODIUM 200 MG/2ML IV SOLN
INTRAVENOUS | Status: DC | PRN
  Administered 2022-08-30: 200 mg via INTRAVENOUS

## 2022-08-30 MED ORDER — HYDROMORPHONE HCL 1 MG/ML IJ SOLN: 0.5000 mg | INTRAMUSCULAR | Status: DC | PRN

## 2022-08-30 MED ORDER — CHLORHEXIDINE GLUCONATE CLOTH 2 % EX PADS: 6.0000 | MEDICATED_PAD | Freq: Once | CUTANEOUS | Status: DC

## 2022-08-30 MED ORDER — OXYCODONE HCL 5 MG/5ML PO SOLN: 5.0000 mg | Freq: Once | ORAL | Status: DC | PRN

## 2022-08-30 MED ORDER — DIPHENHYDRAMINE HCL 12.5 MG/5ML PO ELIX: 12.5000 mg | ORAL_SOLUTION | Freq: Four times a day (QID) | ORAL | Status: DC | PRN

## 2022-08-30 MED ORDER — LACTATED RINGERS IV SOLN: INTRAVENOUS | Status: DC

## 2022-08-30 MED ORDER — SODIUM CHLORIDE 0.9 % IV SOLN: INTRAVENOUS | Status: DC

## 2022-08-30 MED ORDER — TRAMADOL HCL 50 MG PO TABS
50.0000 mg | ORAL_TABLET | Freq: Four times a day (QID) | ORAL | Status: DC | PRN
  Administered 2022-08-30: 50 mg via ORAL
  Filled 2022-08-30: qty 1

## 2022-08-30 MED ORDER — PHENYLEPHRINE 80 MCG/ML (10ML) SYRINGE FOR IV PUSH (FOR BLOOD PRESSURE SUPPORT)
PREFILLED_SYRINGE | INTRAVENOUS | Status: AC
  Filled 2022-08-30: qty 10

## 2022-08-30 MED ORDER — MIDAZOLAM HCL 2 MG/2ML IJ SOLN
INTRAMUSCULAR | Status: AC
  Filled 2022-08-30: qty 2

## 2022-08-30 MED ORDER — PHENYLEPHRINE HCL-NACL 20-0.9 MG/250ML-% IV SOLN
INTRAVENOUS | Status: DC | PRN
  Administered 2022-08-30: 30 ug/min via INTRAVENOUS

## 2022-08-30 MED ORDER — OXYCODONE HCL 5 MG PO TABS: 5.0000 mg | ORAL_TABLET | ORAL | Status: DC | PRN

## 2022-08-30 MED ORDER — ENOXAPARIN SODIUM 40 MG/0.4ML IJ SOSY
40.0000 mg | PREFILLED_SYRINGE | INTRAMUSCULAR | Status: DC
  Filled 2022-08-30: qty 0.4

## 2022-08-30 MED ORDER — INSULIN ASPART 100 UNIT/ML IJ SOLN: 0.0000 [IU] | Freq: Three times a day (TID) | INTRAMUSCULAR | Status: DC

## 2022-08-30 MED ORDER — ROCURONIUM BROMIDE 10 MG/ML (PF) SYRINGE
PREFILLED_SYRINGE | INTRAVENOUS | Status: DC | PRN
  Administered 2022-08-30: 10 mg via INTRAVENOUS
  Administered 2022-08-30: 60 mg via INTRAVENOUS

## 2022-08-30 MED ORDER — DIPHENHYDRAMINE HCL 50 MG/ML IJ SOLN: 12.5000 mg | Freq: Four times a day (QID) | INTRAMUSCULAR | Status: DC | PRN

## 2022-08-30 MED ORDER — DEXAMETHASONE SODIUM PHOSPHATE 10 MG/ML IJ SOLN
INTRAMUSCULAR | Status: AC
  Filled 2022-08-30: qty 1

## 2022-08-30 MED ORDER — EPHEDRINE SULFATE-NACL 50-0.9 MG/10ML-% IV SOSY
PREFILLED_SYRINGE | INTRAVENOUS | Status: DC | PRN
  Administered 2022-08-30: 5 mg via INTRAVENOUS
  Administered 2022-08-30 (×3): 2.5 mg via INTRAVENOUS

## 2022-08-30 MED ORDER — 0.9 % SODIUM CHLORIDE (POUR BTL) OPTIME
TOPICAL | Status: DC | PRN
  Administered 2022-08-30: 1000 mL

## 2022-08-30 SURGICAL SUPPLY — 49 items
ADH SKN CLS APL DERMABOND .7 (GAUZE/BANDAGES/DRESSINGS)
APL PRP STRL LF DISP 70% ISPRP (MISCELLANEOUS) ×1
APL SKNCLS STERI-STRIP NONHPOA (GAUZE/BANDAGES/DRESSINGS)
BAG COUNTER SPONGE SURGICOUNT (BAG) IMPLANT
BAG SPNG CNTER NS LX DISP (BAG)
BENZOIN TINCTURE PRP APPL 2/3 (GAUZE/BANDAGES/DRESSINGS) IMPLANT
CHLORAPREP W/TINT 26 (MISCELLANEOUS) ×2 IMPLANT
CLEANER TIP ELECTROSURG 2X2 (MISCELLANEOUS) IMPLANT
COVER SURGICAL LIGHT HANDLE (MISCELLANEOUS) ×2 IMPLANT
DERMABOND ADVANCED .7 DNX12 (GAUZE/BANDAGES/DRESSINGS) IMPLANT
DRAIN CHANNEL 15F RND FF 3/16 (WOUND CARE) IMPLANT
DRAIN CHANNEL 19F RND (DRAIN) IMPLANT
DRAPE LAPAROSCOPIC ABDOMINAL (DRAPES) ×2 IMPLANT
DRSG OPSITE POSTOP 4X10 (GAUZE/BANDAGES/DRESSINGS) IMPLANT
DRSG TELFA 4X10 ISLAND STR (GAUZE/BANDAGES/DRESSINGS) IMPLANT
DRSG TELFA 4X8 ISLAND (GAUZE/BANDAGES/DRESSINGS) IMPLANT
ELECT REM PT RETURN 15FT ADLT (MISCELLANEOUS) ×2 IMPLANT
EVACUATOR SILICONE 100CC (DRAIN) IMPLANT
GLOVE BIOGEL PI IND STRL 7.0 (GLOVE) ×2 IMPLANT
GLOVE SURG SS PI 7.0 STRL IVOR (GLOVE) ×2 IMPLANT
GOWN STRL REUS W/ TWL LRG LVL3 (GOWN DISPOSABLE) ×2 IMPLANT
GOWN STRL REUS W/ TWL XL LVL3 (GOWN DISPOSABLE) IMPLANT
GOWN STRL REUS W/TWL LRG LVL3 (GOWN DISPOSABLE) ×1
GOWN STRL REUS W/TWL XL LVL3 (GOWN DISPOSABLE)
KIT BASIN OR (CUSTOM PROCEDURE TRAY) ×2 IMPLANT
KIT TURNOVER KIT A (KITS) IMPLANT
MESH BARD SOFT 6X6IN (Mesh General) IMPLANT
NDL HYPO 22X1.5 SAFETY MO (MISCELLANEOUS) ×2 IMPLANT
NEEDLE HYPO 22X1.5 SAFETY MO (MISCELLANEOUS) ×1 IMPLANT
PACK GENERAL/GYN (CUSTOM PROCEDURE TRAY) ×2 IMPLANT
SPIKE FLUID TRANSFER (MISCELLANEOUS) ×2 IMPLANT
SPONGE DRAIN TRACH 4X4 STRL 2S (GAUZE/BANDAGES/DRESSINGS) IMPLANT
STRIP CLOSURE SKIN 1/2X4 (GAUZE/BANDAGES/DRESSINGS) IMPLANT
SUT ETHILON 2 0 PS N (SUTURE) IMPLANT
SUT MNCRL AB 4-0 PS2 18 (SUTURE) ×2 IMPLANT
SUT NOVA 0 T19/GS 22DT (SUTURE) IMPLANT
SUT NOVA NAB GS-21 0 18 T12 DT (SUTURE) IMPLANT
SUT PDS AB 0 CT1 36 (SUTURE) ×4 IMPLANT
SUT VIC AB 2-0 CT1 27 (SUTURE)
SUT VIC AB 2-0 CT1 TAPERPNT 27 (SUTURE) IMPLANT
SUT VIC AB 2-0 SH 27 (SUTURE) ×2
SUT VIC AB 2-0 SH 27XBRD (SUTURE) IMPLANT
SUT VIC AB 3-0 SH 18 (SUTURE) ×2 IMPLANT
SUT VIC AB 3-0 SH 27 (SUTURE)
SUT VIC AB 3-0 SH 27XBRD (SUTURE) IMPLANT
SYR 20ML LL LF (SYRINGE) ×2 IMPLANT
TAPE STRIPS DRAPE STRL (GAUZE/BANDAGES/DRESSINGS) IMPLANT
TOWEL OR 17X26 10 PK STRL BLUE (TOWEL DISPOSABLE) ×2 IMPLANT
TOWEL OR NON WOVEN STRL DISP B (DISPOSABLE) ×2 IMPLANT

## 2022-08-30 NOTE — Transfer of Care (Signed)
Immediate Anesthesia Transfer of Care Note  Patient: Teresa [name]  Procedure(s) Performed: INCISIONAL HERNIA REPAIR WITH MESH  Patient Location: PACU  Anesthesia Type:General  Level of Consciousness: awake  Airway & Oxygen Therapy: Patient Spontanous Breathing and Patient connected to face mask oxygen  Post-op Assessment: Report given to RN and Post -op Vital signs reviewed and stable  Post vital signs: Reviewed and stable  Last Vitals:  Vitals Value Taken Time  BP 122/67 08/30/22 1334  Temp    Pulse 68 08/30/22 1336  Resp 20 08/30/22 1336  SpO2 100 % 08/30/22 1336  Vitals shown include unvalidated device data.  Last Pain:  Vitals:   08/30/22 1000  TempSrc: Oral  PainSc: 0-No pain         Complications: No notable events documented.

## 2022-08-30 NOTE — H&P (Signed)
Chief Complaint: Discuss incisional hernia repair   History of Present Illness: Teresa [name] is a 72 y.o. female who is seen today for incisional hernia follow up.    Review of Systems: A complete review of systems was obtained from the patient. I have reviewed this information and discussed as appropriate with the patient. See HPI as well for other ROS.  Review of Systems Constitutional: Negative. HENT: Negative. Eyes: Negative. Respiratory: Negative. Cardiovascular: Negative. Gastrointestinal: Negative. Genitourinary: Negative. Musculoskeletal: Negative. Skin: Negative. Neurological: Negative. Endo/Heme/Allergies: Negative. Psychiatric/Behavioral: Negative.   Medical History: Past Medical History: Diagnosis Date Aneurysm (CMS-HCC) Arthritis CHF (congestive heart failure) (CMS/HHS-HCC) COPD (chronic obstructive pulmonary disease) (CMS/HHS-HCC) GERD (gastroesophageal reflux disease) Heart valve disease History of cancer Hyperlipidemia  There is no problem list on file for this patient.  Past Surgical History: Procedure Laterality Date WRIST SURGERY Left 1975 LUNG SURGERY 2014 HEART SURGERY 2020, 2023   Allergies Allergen Reactions Azithromycin Shortness Of Breath Cefaclor Rash and Shortness Of Breath Penicillins Other (See Comments) Not sure-pt was infant  Unknown from childhood Did it involve swelling of the face/tongue/throat, SOB, or low BP? No Did it involve sudden or severe rash/hives, skin peeling, or any reaction on the inside of your mouth or nose? No Did you need to seek medical attention at a hospital or doctor's office? Yes When did it last happen? Infancy If all above answers are "NO", may proceed with cephalosporin use. Sulfamethoxazole-Trimethoprim Rash and Shortness Of Breath Atorvastatin Other (See Comments) Severe muscle aches Doxycycline Swelling Redness on the face Ezetimibe Other (See Comments) Severe stomach pain Morphine  Other (See Comments) pain Hydrocodone-Acetaminophen Nausea And Vomiting Sulfamethoxazole Rash  Current Outpatient Medications on File Prior to Visit Medication Sig Dispense Refill acetaminophen (TYLENOL) 650 MG ER tablet Take 650 mg by mouth every 8 (eight) hours as needed for Pain albuterol (PROVENTIL) 2.5 mg /3 mL (0.083 %) nebulizer solution Inhale into the lungs B comp/E/folic acid/mins35/soy (MENOPAUSE SUPPORT ORAL) Take 1 capsule by mouth every morning bisoprolol (ZEBETA) 5 MG tablet Take 0.5 tablets by mouth once daily clopidogreL (PLAVIX) 75 mg tablet Take 1 tablet by mouth once daily empagliflozin (JARDIANCE) 10 mg tablet Take 1 tablet by mouth once daily ENTRESTO 24-26 mg tablet Take by mouth FUROsemide (LASIX) 40 MG tablet TAKE 1 TABLET BY MOUTH AS NEEDED FOR FLUID OR EDEMA ipratropium (ATROVENT) 21 mcg (0.03 %) nasal spray Place 2 sprays into both nostrils 2 (two) times daily lansoprazole (PREVACID) 30 MG DR capsule TAKE 1 CAPSULE BY MOUTH EVERY DAY BEFORE A MEAL nitroGLYcerin (NITROSTAT) 0.4 MG SL tablet DISSOLVE ONE TABLET UNDER TONGUE AS NEEDED FOR CHEST PAIN EVERY 5 MINUTES PRALUENT PEN 150 mg/mL PnIj INJECT 1 PEN INTO THE SKIN EVERY 14 (FOURTEEN) DAYS. rosuvastatin (CRESTOR) 5 MG tablet Take by mouth simethicone (MYLICON) 80 MG chewable tablet Take by mouth spironolactone (ALDACTONE) 25 MG tablet Take 25 mg by mouth once daily TRELEGY ELLIPTA 100-62.5-25 mcg inhaler USE 1 INHALATION BY MOUTH ONCE DAILY  No current facility-administered medications on file prior to visit.  History reviewed. No pertinent family history.  Social History  Tobacco Use Smoking Status Former Types: Cigarettes Smokeless Tobacco Never   Social History  Socioeconomic History Marital status: Married Tobacco Use Smoking status: Former Types: Cigarettes Smokeless tobacco: Never Substance and Sexual Activity Alcohol use: Not Currently Drug use: Never  Social Determinants of  Health  Financial Resource Strain: Low Risk (10/07/2018) Received from [facility], [place] Overall Financial Resource Strain (CARDIA) Difficulty of Paying Living  Expenses: Not hard at all Food Insecurity: No Food Insecurity (10/07/2018) Received from [facility], [place] Hunger Vital Sign Worried About Running Out of Food in the Last Year: Never true Ran Out of Food in the Last Year: Never true Transportation Needs: No Transportation Needs (10/07/2018) Received from [facility], [facility] - Transportation Lack of Transportation (Medical): No Lack of Transportation (Non-Medical): No Physical Activity: Insufficiently Active (10/07/2018) Received from [facility], [place] Exercise Vital Sign Days of Exercise per Week: 4 days Minutes of Exercise per Session: 30 min Stress: No Stress Concern Present (10/07/2018) Received from [facility], [facility] - Occupational Stress Questionnaire Feeling of Stress : Only a little  Objective:  Vitals: 07/05/22 0926 Pulse: 72 Temp: 36.4 C (97.5 F) SpO2: 97% Weight: 52.8 kg (116 lb 6.4 oz) Height: 151.1 cm (4' 11.5") PainSc: 2  Body mass index is 23.12 kg/m.  Physical Exam Constitutional: Appearance: Normal appearance. HENT: Head: Normocephalic and atraumatic. Pulmonary: Effort: Pulmonary effort is normal. Abdominal: Comments: Well healed midline scar, palpable infraumbilical large hernia Musculoskeletal: General: Normal range of motion. Cervical back: Normal range of motion. Neurological: General: No focal deficit present. Mental Status: She is alert and oriented to person, place, and time. Mental status is at baseline. Psychiatric: Mood and Affect: Mood normal. Behavior: Behavior normal. Thought Content: Thought content normal.    Labs, Imaging and Diagnostic Testing:  I reviewed Dr. Jones Broom and Dr. Verita Lamb notes.  Assessment and Plan:  Diagnoses and all  orders for this visit:  Incisional hernia without obstruction or gangrene  Ischemic cardiomyopathy  Peripheral vascular disease (CMS-HCC)  The patient has a symptomatic reducible hernia. We discussed the etiology of his hernia, the risk of it enlarging, incarceration, obstruction, strangulation, and that it is unlikely to get smaller or better on its own. We discussed operative options of laparoscopic vs open repair with mesh including the risks of recurrence, injury to intestines or abdominal organs, or chronic pain associated with mesh. We decided to proceed with open incisional hernia with retrorectus repair and inpatient admission.

## 2022-08-30 NOTE — Anesthesia Postprocedure Evaluation (Signed)
Anesthesia Post Note  Patient: Teresa [name]  Procedure(s) Performed: INCISIONAL HERNIA REPAIR WITH MESH     Patient location during evaluation: PACU Anesthesia Type: General Level of consciousness: awake and alert Pain management: pain level controlled Vital Signs Assessment: post-procedure vital signs reviewed and stable Respiratory status: spontaneous breathing, nonlabored ventilation and respiratory function stable Cardiovascular status: blood pressure returned to baseline and stable Postop Assessment: no apparent nausea or vomiting Anesthetic complications: no   No notable events documented.  Last Vitals:  Vitals:   08/30/22 1445 08/30/22 1508  BP: 107/67 (!) 94/46  Pulse: 65   Resp: 19 18  Temp:  36.5 C  SpO2: 100% 96%    Last Pain:  Vitals:   08/30/22 1508  TempSrc: Oral  PainSc: 0-No pain                 Lowella Curb

## 2022-08-30 NOTE — Anesthesia Procedure Notes (Signed)
Procedure Name: Intubation Date/Time: 08/30/2022 11:33 AM  Performed by: Caren Macadam, CRNAPre-anesthesia Checklist: Patient identified, Emergency Drugs available, Suction available and Patient being monitored Patient Re-evaluated:Patient Re-evaluated prior to induction Oxygen Delivery Method: Circle system utilized Preoxygenation: Pre-oxygenation with 100% oxygen Induction Type: IV induction Ventilation: Mask ventilation without difficulty Laryngoscope Size: Miller and 2 Grade View: Grade I Tube type: Oral Tube size: 7.0 mm Number of attempts: 1 Airway Equipment and Method: Stylet Placement Confirmation: ETT inserted through vocal cords under direct vision, positive ETCO2 and breath sounds checked- equal and bilateral Secured at: 22 cm Tube secured with: Tape Dental Injury: Teeth and Oropharynx as per pre-operative assessment

## 2022-08-30 NOTE — Progress Notes (Signed)
Teresa [name]

## 2022-08-30 NOTE — Op Note (Signed)
PATIENT:  Teresa [name]  72 y.o. female  PRE-OPERATIVE DIAGNOSIS:  INCISIONAL HERNIA,   POST-OPERATIVE DIAGNOSIS:  INCISIONAL HERNIA, PROCEDURE:  Procedure(s): INCISIONAL HERNIA REPAIR WITH MESH   SURGEON:  Surgeon(s): Alondria Mousseau, [name], MD  ASSISTANT: none   ANESTHESIA:   local and general  Indications for procedure: Teresa [name] is a 72 y.o. year old female with symptoms of abdominal pain, constipation and enlarging infraumbilical bulge.  Description of procedure: The patient was brought into the operative suite. Anesthesia was administered with General endotracheal anesthesia. WHO checklist was applied. The patient was then placed in supine. The area was prepped and draped in the usual sterile fashion.  The previous midline incision was incised and dissected down to the fascia. The hernia was identified.  The hernia sac contained colon and was opened and contents reduced into the peritoneal space. The hernia sac was removed The hernia defect was 3 x 4 cm in diameter. The hernia sac was removed. The peritoneum was dissected free of the rectus muscles. 2-0 vicryl was used to close the peritoneal layer. Due to the size of the hernia, a 15 x 13 cm cm Bard mesh was inserted into the preperitoneal space. Care was taken to lay the mesh flat. A 15 fr blake drain was placed over the mesh and brought through the left side of the fascia nd the crossed the subcutaneous portion of the wound to come out the right lower quadrant. 2-0 nylon was used to suture the drain in place. The fascial defect was then primarily closed with interrupted 0 PDS sutures. The deep dermal space was closed with a 3-0 vicryl. Marcaine/Exparel mix was injected into the muscle layer and around the fascia. The skin was closed with a 4-0 monocryl subcuticular suture. Steristrips and dressing were put in place for dressing. The patient awoke from anesthesia and was brought to pacu in stable condition. All counts were  correct.  Findings: 3 x 4 cm   Specimen: none  Blood loss: 40 ml  Local anesthesia: 50 ml Marcaine/Exparel mix  Complications: none  PLAN OF CARE: Admit to inpatient   PATIENT DISPOSITION:  PACU - hemodynamically stable.  Feliciana Rossetti, M.D. General, Bariatric, & Minimally Invasive Surgery [facility], [place]  08/30/2022 1:40 PM

## 2022-08-30 NOTE — Progress Notes (Signed)
Teresa [name] 

## 2022-08-31 ENCOUNTER — Encounter (HOSPITAL_COMMUNITY): Payer: Self-pay | Admitting: General Surgery

## 2022-08-31 DIAGNOSIS — K432 Incisional hernia without obstruction or gangrene: Secondary | ICD-10-CM | POA: Diagnosis not present

## 2022-08-31 LAB — BASIC METABOLIC PANEL
Anion gap: 8 (ref 5–15)
BUN: 21 mg/dL (ref 8–23)
CO2: 24 mmol/L (ref 22–32)
Calcium: 8.4 mg/dL — ABNORMAL LOW (ref 8.9–10.3)
Chloride: 105 mmol/L (ref 98–111)
Creatinine, Ser: 1.06 mg/dL — ABNORMAL HIGH (ref 0.44–1.00)
GFR, Estimated: 57 mL/min — ABNORMAL LOW (ref >60)
Glucose, Bld: 115 mg/dL — ABNORMAL HIGH (ref 70–99)
Potassium: 4.2 mmol/L (ref 3.5–5.1)
Sodium: 137 mmol/L (ref 135–145)

## 2022-08-31 LAB — CBC
HCT: 35.5 % — ABNORMAL LOW (ref 36.0–46.0)
Hemoglobin: 11.2 g/dL — ABNORMAL LOW (ref 12.0–15.0)
MCH: 29.9 pg (ref 26.0–34.0)
MCHC: 31.5 g/dL (ref 30.0–36.0)
MCV: 94.7 fL (ref 80.0–100.0)
Platelets: 213 10*3/uL (ref 150–400)
RBC: 3.75 MIL/uL — ABNORMAL LOW (ref 3.87–5.11)
RDW: 13.6 % (ref 11.5–15.5)
WBC: 7.8 10*3/uL (ref 4.0–10.5)
nRBC: 0 % (ref 0.0–0.2)

## 2022-08-31 LAB — GLUCOSE, CAPILLARY: Glucose-Capillary: 86 mg/dL (ref 70–99)

## 2022-08-31 LAB — HEMOGLOBIN A1C
Hgb A1c MFr Bld: 6.1 % — ABNORMAL HIGH (ref 4.8–5.6)
Mean Plasma Glucose: 128 mg/dL

## 2022-08-31 MED ORDER — TRAMADOL HCL 50 MG PO TABS: 50.0000 mg | ORAL_TABLET | Freq: Three times a day (TID) | ORAL | 0 refills | Status: DC | PRN

## 2022-08-31 NOTE — Discharge Summary (Signed)
Physician Discharge Summary  Teresa [name] JXB:[id number] DOB: 03-Jul-1950 DOA: 08/30/2022  PCP: Lorenda Ishihara, MD  Admit date: 08/30/2022 Discharge date:  08/31/2022   Recommendations for Outpatient Follow-up:   (include homehealth, outpatient follow-up instructions, specific recommendations for PCP to follow-up on, etc.)   Follow-up Information     Teresa [name], [name], MD Follow up on 09/26/2022.   Specialty: General Surgery Contact information: [address] [place] [address] [phone]                Discharge Diagnoses:  Principal Problem:   Incisional hernia   Surgical Procedure: open incisional hernia repair with mesh  Discharge Condition: Good Disposition: Home  Diet recommendation: carb mod diet   Hospital Course:  72 yo female underwent surgery for incisional hernia. Post op she did well. She was able to mobilize and tolerated a diet. She was discharged home POD 1.  Discharge Instructions  Discharge Instructions     Call MD for:  difficulty breathing, headache or visual disturbances   Complete by: As directed    Call MD for:  hives   Complete by: As directed    Call MD for:  persistant nausea and vomiting   Complete by: As directed    Call MD for:  redness, tenderness, or signs of infection (pain, swelling, redness, odor or green/yellow discharge around incision site)   Complete by: As directed    Call MD for:  severe uncontrolled pain   Complete by: As directed    Call MD for:  temperature >100.4   Complete by: As directed    Diet - low sodium heart healthy   Complete by: As directed    Discharge wound care:   Complete by: As directed    Shower normal tomorrow. Glue to stay on for 10-14 days. No bandage needed.   Increase activity slowly   Complete by: As directed       Allergies as of 08/31/2022       Reactions   Azithromycin Shortness Of Breath   Other Reaction(s): burning rash/body   Cefaclor  Rash, Shortness Of Breath   Other Reaction(s): breathing difficulty   Penicillins Other (See Comments)   Unknown from childhood Did it involve swelling of the face/tongue/throat, SOB, or low BP? No Did it involve sudden or severe rash/hives, skin peeling, or any reaction on the inside of your mouth or nose? No Did you need to seek medical attention at a hospital or doctor's office? Yes When did it last happen? Infancy  If all above answers are "NO", may proceed with cephalosporin use.   Septra [sulfamethoxazole-trimethoprim] Shortness Of Breath, Rash   Doxycycline Swelling, Other (See Comments)   Redness on the face   Lipitor [atorvastatin] Other (See Comments)   Severe muscle aches   Morphine And Codeine Nausea And Vomiting, Other (See Comments)   Terrible headache   Zetia [ezetimibe] Other (See Comments)   Severe stomach pain   Zocor [simvastatin] Other (See Comments)   Severe muscle aches   Adhesive [tape] Other (See Comments)   Redness and swelling - use paper tape   Vicodin [hydrocodone-acetaminophen] Nausea And Vomiting        Medication List     TAKE these medications    acetaminophen 500 MG tablet Commonly known as: TYLENOL Take 1,000 mg by mouth every 6 (six) hours as needed for moderate pain.   AeroChamber MV inhaler Use as instructed   albuterol 108 (90 Base) MCG/ACT inhaler  Commonly known as: VENTOLIN HFA Inhale 2 puffs into the lungs every 6 (six) hours as needed for wheezing or shortness of breath.   albuterol (2.5 MG/3ML) 0.083% nebulizer solution Commonly known as: PROVENTIL Take 2.5 mg by nebulization every 6 (six) hours as needed for shortness of breath or wheezing.   bisoprolol 5 MG tablet Commonly known as: ZEBETA Take 0.5 tablets (2.5 mg total) by mouth daily.   clindamycin 150 MG capsule Commonly known as: CLEOCIN Take 600 mg by mouth See admin instructions. Take 600 mg by mouth 1 hour prior to dental appointment   clopidogrel 75 MG  tablet Commonly known as: PLAVIX Take 1 tablet (75 mg total) by mouth daily.   [employer] MENOPAUSE SUPPORT PO Take 1 capsule by mouth in the morning.   Entresto 24-26 MG Generic drug: sacubitril-valsartan Take 1 tablet by mouth 2 (two) times daily. Please wait to begin for 36 hours after discontinuing the Losartan.   furosemide 40 MG tablet Commonly known as: LASIX TAKE 1 TABLET BY MOUTH AS NEEDED FOR FLUID OR EDEMA What changed:  how much to take how to take this when to take this additional instructions   Hypromellose 0.2 % Soln Place 2 drops into both eyes 4 (four) times daily as needed (dry eyes).   Jardiance 10 MG Tabs tablet Generic drug: empagliflozin Take 1 tablet (10 mg total) by mouth daily.   lansoprazole 30 MG capsule Commonly known as: PREVACID Take 30 mg by mouth in the morning.   NASAL SALINE NA Place 1 spray into the nose every 4 (four) hours as needed (congestion).   nitroGLYCERIN 0.4 MG SL tablet Commonly known as: NITROSTAT DISSOLVE ONE TABLET UNDER TONGUE AS NEEDED FOR CHEST PAIN EVERY 5 MINUTES   Praluent 150 MG/ML Soaj Generic drug: Alirocumab INJECT 1 PEN INTO THE SKIN EVERY 14 (FOURTEEN) DAYS.   rosuvastatin 5 MG tablet Commonly known as: CRESTOR Take 1 tablet (5 mg total) by mouth 3 (three) times a week. What changed: when to take this   simethicone 80 MG chewable tablet Commonly known as: MYLICON Chew 2 tablets (160 mg total) by mouth every 6 (six) hours as needed for flatulence.   spironolactone 25 MG tablet Commonly known as: ALDACTONE TAKE 1 TABLET(25 MG) BY MOUTH DAILY   traMADol 50 MG tablet Commonly known as: ULTRAM Take 1 tablet (50 mg total) by mouth every 8 (eight) hours as needed for up to 12 doses.   Trelegy Ellipta 100-62.5-25 MCG/ACT Aepb Generic drug: Fluticasone-Umeclidin-Vilant Inhale 1 puff into the lungs daily.   Zantac 360 10 MG tablet Generic drug: famotidine Take 10 mg by mouth daily as needed for heartburn or  indigestion.               Discharge Care Instructions  (From admission, onward)           Start     Ordered   08/31/22 0000  Discharge wound care:       Comments: Shower normal tomorrow. Glue to stay on for 10-14 days. No bandage needed.   08/31/22 1610            Follow-up Information     Teresa [name], [name], MD Follow up on 09/26/2022.   Specialty: General Surgery Contact information: [address] [place] [address] [phone]                  The results of significant diagnostics from this hospitalization (including imaging, microbiology, ancillary  and laboratory) are listed below for reference.    Significant Diagnostic Studies: No results found.  Labs: Basic Metabolic Panel: Recent Labs  Lab 08/24/22 0919 08/30/22 1354 08/31/22 0445  NA 136  --  137  K 5.2*  --  4.2  CL 102  --  105  CO2 25  --  24  GLUCOSE 99  --  115*  BUN 29*  --  21  CREATININE 1.15* 0.91 1.06*  CALCIUM 9.4  --  8.4*   Liver Function Tests: No results for input(s): "AST", "ALT", "ALKPHOS", "BILITOT", "PROT", "ALBUMIN" in the last 168 hours.  CBC: Recent Labs  Lab 08/24/22 0919 08/30/22 1354 08/31/22 0445  WBC 6.6 8.9 7.8  HGB 14.0 12.2 11.2*  HCT 44.5 37.5 35.5*  MCV 95.5 94.7 94.7  PLT 276 192 213    CBG: Recent Labs  Lab 08/30/22 1707 08/30/22 2116 08/31/22 0726  GLUCAP 115* 156* 86    Principal Problem:   Incisional hernia   Time coordinating discharge: 15 min

## 2022-08-31 NOTE — Care Management Obs Status (Signed)
MEDICARE OBSERVATION STATUS NOTIFICATION   Patient Details  Name: BOSTYN BRICH MRN: [id number] Date of Birth: [birth year]   Medicare Observation Status Notification Given:  Yes    Amada Jupiter, LCSW 08/31/2022, 10:15 AM

## 2022-08-31 NOTE — Care Management CC44 (Signed)
Condition Code 44 Documentation Completed  Patient Details  Name: JAIANNAH TEUBNER MRN: [id number] Date of Birth: 1950-11-08   Condition Code 44 given:  Yes Patient signature on Condition Code 44 notice:  Yes Documentation of 2 MD's agreement:  Yes Code 44 added to claim:  Yes    Pammie Chirino, LCSW 08/31/2022, 10:15 AM

## 2022-08-31 NOTE — Discharge Instructions (Signed)
[facility] _______[facility]  UMBILICAL OR INGUINAL HERNIA REPAIR: POST OP INSTRUCTIONS  Always review your discharge instruction sheet given to you by the facility where your surgery was performed. IF YOU HAVE DISABILITY OR FAMILY LEAVE FORMS, YOU MUST BRING THEM TO THE OFFICE FOR PROCESSING.   DO NOT GIVE THEM TO YOUR DOCTOR.  1. A  prescription for pain medication may be given to you upon discharge.  Take your pain medication as prescribed, if needed.  If narcotic pain medicine is not needed, then you may take acetaminophen (Tylenol) or ibuprofen (Advil) as needed. 2. Take your usually prescribed medications unless otherwise directed. If you need a refill on your pain medication, please contact your pharmacy.  They will contact our office to request authorization. Prescriptions will not be filled after 5 pm or on week-ends. 3. You should follow a light diet the first 24 hours after arrival home, such as soup and crackers, etc.  Be sure to include lots of fluids daily.  Resume your normal diet the day after surgery. 4.Most patients will experience some swelling and bruising around the umbilicus or in the groin and scrotum.  Ice packs and reclining will help.  Swelling and bruising can take several days to resolve.  6. It is common to experience some constipation if taking pain medication after surgery.  Increasing fluid intake and taking a stool softener (such as Colace) will usually help or prevent this problem from occurring.  A mild laxative (Milk of Magnesia or Miralax) should be taken according to package directions if there are no bowel movements after 48 hours. 7. Unless discharge instructions indicate otherwise, you may remove your bandages 24-48 hours after surgery, and you may shower at that time.  You may have steri-strips (small skin tapes) in place directly over the incision.  These strips should be left on the skin for 7-10 days.  If your surgeon used skin glue on the  incision, you may shower in 24 hours.  The glue will flake off over the next 2-3 weeks.  Any sutures or staples will be removed at the office during your follow-up visit. 8. ACTIVITIES:  You may resume regular (light) daily activities beginning the next day--such as daily self-care, walking, climbing stairs--gradually increasing activities as tolerated.  You may have sexual intercourse when it is comfortable.  Refrain from any heavy lifting or straining until approved by your doctor.  a.You may drive when you are no longer taking prescription pain medication, you can comfortably wear a seatbelt, and you can safely maneuver your car and apply brakes. b.RETURN TO WORK:   _____________________________________________  9.You should see your doctor in the office for a follow-up appointment approximately 2-3 weeks after your surgery.  Make sure that you call for this appointment within a day or two after you arrive home to insure a convenient appointment time. 10.OTHER INSTRUCTIONS: _________________________    _____________________________________  WHEN TO CALL YOUR DOCTOR: Fever over 101.0 Inability to urinate Nausea and/or vomiting Extreme swelling or bruising Continued bleeding from incision. Increased pain, redness, or drainage from the incision  The clinic staff is available to answer your questions during regular business hours.  Please don't hesitate to call and ask to speak to one of the nurses for clinical concerns.  If you have a medical emergency, go to the nearest emergency room or call 911.  A surgeon from [facility] is always on call at the hospital   [address], [address],   [place], [place]  [address] ?  [address] [address], [place], [place]   [address] [phone] ? [phone] ? FAX [phone] Web site: [email or web address]  

## 2022-09-17 ENCOUNTER — Other Ambulatory Visit (HOSPITAL_COMMUNITY): Payer: Self-pay | Admitting: Pharmacist

## 2022-09-17 ENCOUNTER — Other Ambulatory Visit (HOSPITAL_COMMUNITY): Payer: Self-pay

## 2022-09-17 ENCOUNTER — Telehealth (HOSPITAL_COMMUNITY): Payer: Self-pay | Admitting: Pharmacy Technician

## 2022-09-17 ENCOUNTER — Other Ambulatory Visit: Payer: Self-pay

## 2022-09-17 MED ORDER — ENTRESTO 24-26 MG PO TABS
1.0000 | ORAL_TABLET | Freq: Two times a day (BID) | ORAL | 3 refills | Status: DC
  Filled 2022-09-17: qty 180, 90d supply, fill #0
  Filled 2022-12-12: qty 180, 90d supply, fill #1
  Filled 2023-03-12: qty 180, 90d supply, fill #2

## 2022-09-17 NOTE — Telephone Encounter (Signed)
Advanced Heart Failure Patient Advocate Encounter  The patient was approved for a [employer] grant that will help cover the cost of Entresto, Jardiance. Total amount awarded, $10,000. Eligibility, 08/18/22 - 08/17/23.  ID [id number]  BIN [id number]  PCN [id number]  Group [id number]  Called and spoke with the patient. Information added into WAM. Sent 90 day RX request for Entresto to [facility]) to send to [facility].    Archer Asa, CPhT

## 2022-09-18 ENCOUNTER — Other Ambulatory Visit (HOSPITAL_COMMUNITY): Payer: Self-pay

## 2022-09-18 ENCOUNTER — Other Ambulatory Visit: Payer: Self-pay

## 2022-09-26 DIAGNOSIS — K432 Incisional hernia without obstruction or gangrene: Secondary | ICD-10-CM | POA: Diagnosis not present

## 2022-11-02 DIAGNOSIS — H52223 Regular astigmatism, bilateral: Secondary | ICD-10-CM | POA: Diagnosis not present

## 2022-11-02 DIAGNOSIS — H11153 Pinguecula, bilateral: Secondary | ICD-10-CM | POA: Diagnosis not present

## 2022-11-02 DIAGNOSIS — H01004 Unspecified blepharitis left upper eyelid: Secondary | ICD-10-CM | POA: Diagnosis not present

## 2022-11-02 DIAGNOSIS — H2513 Age-related nuclear cataract, bilateral: Secondary | ICD-10-CM | POA: Diagnosis not present

## 2022-11-02 DIAGNOSIS — H02831 Dermatochalasis of right upper eyelid: Secondary | ICD-10-CM | POA: Diagnosis not present

## 2022-11-02 DIAGNOSIS — H02834 Dermatochalasis of left upper eyelid: Secondary | ICD-10-CM | POA: Diagnosis not present

## 2022-11-02 DIAGNOSIS — H25013 Cortical age-related cataract, bilateral: Secondary | ICD-10-CM | POA: Diagnosis not present

## 2022-11-02 DIAGNOSIS — H01001 Unspecified blepharitis right upper eyelid: Secondary | ICD-10-CM | POA: Diagnosis not present

## 2022-11-09 DIAGNOSIS — Z7984 Long term (current) use of oral hypoglycemic drugs: Secondary | ICD-10-CM | POA: Diagnosis not present

## 2022-11-09 DIAGNOSIS — I5042 Chronic combined systolic (congestive) and diastolic (congestive) heart failure: Secondary | ICD-10-CM | POA: Diagnosis not present

## 2022-11-09 DIAGNOSIS — I700 Atherosclerosis of aorta: Secondary | ICD-10-CM | POA: Diagnosis not present

## 2022-11-09 DIAGNOSIS — Z6823 Body mass index (BMI) 23.0-23.9, adult: Secondary | ICD-10-CM | POA: Diagnosis not present

## 2022-11-09 DIAGNOSIS — Z85118 Personal history of other malignant neoplasm of bronchus and lung: Secondary | ICD-10-CM | POA: Diagnosis not present

## 2022-11-09 DIAGNOSIS — I25118 Atherosclerotic heart disease of native coronary artery with other forms of angina pectoris: Secondary | ICD-10-CM | POA: Diagnosis not present

## 2022-11-09 DIAGNOSIS — I70213 Atherosclerosis of native arteries of extremities with intermittent claudication, bilateral legs: Secondary | ICD-10-CM | POA: Diagnosis not present

## 2022-11-09 DIAGNOSIS — J449 Chronic obstructive pulmonary disease, unspecified: Secondary | ICD-10-CM | POA: Diagnosis not present

## 2022-11-09 DIAGNOSIS — Z902 Acquired absence of lung [part of]: Secondary | ICD-10-CM | POA: Diagnosis not present

## 2022-11-09 DIAGNOSIS — I714 Abdominal aortic aneurysm, without rupture, unspecified: Secondary | ICD-10-CM | POA: Diagnosis not present

## 2022-11-09 DIAGNOSIS — E261 Secondary hyperaldosteronism: Secondary | ICD-10-CM | POA: Diagnosis not present

## 2022-11-09 DIAGNOSIS — Z7902 Long term (current) use of antithrombotics/antiplatelets: Secondary | ICD-10-CM | POA: Diagnosis not present

## 2022-11-12 ENCOUNTER — Other Ambulatory Visit: Payer: Self-pay

## 2022-11-12 DIAGNOSIS — I2510 Atherosclerotic heart disease of native coronary artery without angina pectoris: Secondary | ICD-10-CM

## 2022-11-12 DIAGNOSIS — E785 Hyperlipidemia, unspecified: Secondary | ICD-10-CM

## 2022-11-12 DIAGNOSIS — I25119 Atherosclerotic heart disease of native coronary artery with unspecified angina pectoris: Secondary | ICD-10-CM

## 2022-11-12 MED ORDER — PRALUENT 150 MG/ML ~~LOC~~ SOAJ
150.0000 mg | SUBCUTANEOUS | 3 refills | Status: DC
Start: 2022-11-12 — End: 2022-11-13

## 2022-11-13 ENCOUNTER — Other Ambulatory Visit: Payer: Self-pay | Admitting: Pharmacist

## 2022-11-13 ENCOUNTER — Telehealth: Payer: Self-pay | Admitting: Interventional Cardiology

## 2022-11-13 DIAGNOSIS — E785 Hyperlipidemia, unspecified: Secondary | ICD-10-CM

## 2022-11-13 DIAGNOSIS — I2510 Atherosclerotic heart disease of native coronary artery without angina pectoris: Secondary | ICD-10-CM

## 2022-11-13 DIAGNOSIS — I359 Nonrheumatic aortic valve disorder, unspecified: Secondary | ICD-10-CM

## 2022-11-13 DIAGNOSIS — I25119 Atherosclerotic heart disease of native coronary artery with unspecified angina pectoris: Secondary | ICD-10-CM

## 2022-11-13 DIAGNOSIS — Z952 Presence of prosthetic heart valve: Secondary | ICD-10-CM

## 2022-11-13 MED ORDER — PRALUENT 150 MG/ML ~~LOC~~ SOAJ
150.0000 mg | SUBCUTANEOUS | 3 refills | Status: DC
Start: 2022-11-13 — End: 2023-04-08

## 2022-11-13 NOTE — Telephone Encounter (Signed)
Pt c/o medication issue:  1. Name of Medication:  spironolactone (ALDACTONE) 25 MG tablet  2. How are you currently taking this medication (dosage and times per day)?   3. Are you having a reaction (difficulty breathing--STAT)?   4. What is your medication issue?   Patient states she was advised to stop Spironolactone if she notices that she feels unbalanced. She states she occasionally feels unbalanced again and would like to know what Dr. Eldridge Dace recommends.

## 2022-11-13 NOTE — Telephone Encounter (Signed)
Left voicemail to return call to office.

## 2022-11-15 NOTE — Telephone Encounter (Signed)
I spoke with patient.  She reports for the last year or so she has felt a little "off balance"  when turning to the side or going up and down steps.  Has not fallen.  States she discussed at last office visit with Dr Eldridge Dace and he mentioned if it continued to let office know as spironolactone dose may be changed.  Patient reports she checks BP daily and most recent readings have been 112/49, 123/43 and 112/45.  Orthostatic precautions reviewed with patient. Will forward to Dr Eldridge Dace to see if medication change needed.

## 2022-11-15 NOTE — Telephone Encounter (Signed)
WOuld check echo to make sure there is no significant AI, since pulse pressure looks wide.  Can decrease spironolactone to 12.5 mg daily.  She had TAVR and can f/u with whichever MD did her TAVR.

## 2022-11-15 NOTE — Telephone Encounter (Signed)
Patient is returning call and is requesting return call.

## 2022-11-19 MED ORDER — SPIRONOLACTONE 25 MG PO TABS: 12.5000 mg | ORAL_TABLET | Freq: Every day | ORAL | 3 refills | Status: DC

## 2022-11-19 NOTE — Telephone Encounter (Signed)
Patient notified.  Prescription sent to [facility] on [address].

## 2022-11-29 ENCOUNTER — Ambulatory Visit (HOSPITAL_COMMUNITY): Payer: PPO | Attending: Interventional Cardiology

## 2022-11-29 DIAGNOSIS — Z952 Presence of prosthetic heart valve: Secondary | ICD-10-CM

## 2022-11-29 DIAGNOSIS — I359 Nonrheumatic aortic valve disorder, unspecified: Secondary | ICD-10-CM | POA: Diagnosis not present

## 2022-11-29 LAB — ECHOCARDIOGRAM COMPLETE
AV Mean grad: 12.8 mmHg
AV Peak grad: 22.7 mmHg
Ao pk vel: 2.38 m/s
Area-P 1/2: 2.76 cm2
S' Lateral: 3.3 cm

## 2022-11-29 MED ORDER — PERFLUTREN LIPID MICROSPHERE
1.0000 mL | INTRAVENOUS | Status: AC | PRN
Start: 2022-11-29 — End: 2022-11-29
  Administered 2022-11-29: 2 mL via INTRAVENOUS

## 2022-12-04 ENCOUNTER — Telehealth: Payer: Self-pay | Admitting: *Deleted

## 2022-12-04 NOTE — Telephone Encounter (Signed)
-----   Message from Lo[place] sent at 11/30/2022  9:50 PM EDT ----- Echo shows normal aortic valve function.  I would have her follow with one of the three  TAVR doctors.  Dr. Clifton James did her TAVR and Dr. Excell Seltzer helped me years ago when she had her extensive dissection of the LAD and circumflex requiring 9 stents to fix.

## 2022-12-04 NOTE — Telephone Encounter (Signed)
Her LV function is low normal. We can discuss this at her follow up visit if she is feeling well. Chris   Patient notified.

## 2022-12-04 NOTE — Telephone Encounter (Signed)
I spoke with patient and reviewed results with her.  She viewed results in my chart and noticed ejection fraction was lower than previous echo.   She is asking if this is concerning and if any changes need to be made.

## 2022-12-12 ENCOUNTER — Other Ambulatory Visit (HOSPITAL_COMMUNITY): Payer: Self-pay

## 2022-12-14 ENCOUNTER — Other Ambulatory Visit (HOSPITAL_COMMUNITY): Payer: Self-pay

## 2022-12-14 ENCOUNTER — Other Ambulatory Visit: Payer: Self-pay

## 2022-12-28 DIAGNOSIS — K219 Gastro-esophageal reflux disease without esophagitis: Secondary | ICD-10-CM | POA: Diagnosis not present

## 2022-12-28 DIAGNOSIS — I10 Essential (primary) hypertension: Secondary | ICD-10-CM | POA: Diagnosis not present

## 2022-12-28 DIAGNOSIS — R54 Age-related physical debility: Secondary | ICD-10-CM | POA: Diagnosis not present

## 2022-12-28 DIAGNOSIS — I2510 Atherosclerotic heart disease of native coronary artery without angina pectoris: Secondary | ICD-10-CM | POA: Diagnosis not present

## 2022-12-28 DIAGNOSIS — I5042 Chronic combined systolic (congestive) and diastolic (congestive) heart failure: Secondary | ICD-10-CM | POA: Diagnosis not present

## 2022-12-28 DIAGNOSIS — Z23 Encounter for immunization: Secondary | ICD-10-CM | POA: Diagnosis not present

## 2022-12-28 DIAGNOSIS — I739 Peripheral vascular disease, unspecified: Secondary | ICD-10-CM | POA: Diagnosis not present

## 2022-12-28 DIAGNOSIS — I700 Atherosclerosis of aorta: Secondary | ICD-10-CM | POA: Diagnosis not present

## 2022-12-28 DIAGNOSIS — M810 Age-related osteoporosis without current pathological fracture: Secondary | ICD-10-CM | POA: Diagnosis not present

## 2022-12-31 ENCOUNTER — Telehealth (HOSPITAL_COMMUNITY): Payer: Self-pay

## 2022-12-31 NOTE — Telephone Encounter (Signed)
Advanced Heart Failure Patient Advocate Encounter  The patient was renewed for a [employer] grant that will help cover the cost of Praluent.  Total amount awarded, $10,000.  Effective: 12/05/2022 - 12/04/2023.  BIN [id number] PCN [id number] Group [id number] ID [id number]  Pharmacy provided with approval and processing information. Patient informed via phone.  Teresa [name], CPhT Rx Patient Advocate Phone: [phone]

## 2023-01-21 DIAGNOSIS — N289 Disorder of kidney and ureter, unspecified: Secondary | ICD-10-CM | POA: Diagnosis not present

## 2023-02-07 DIAGNOSIS — E875 Hyperkalemia: Secondary | ICD-10-CM | POA: Diagnosis not present

## 2023-02-21 DIAGNOSIS — Z1231 Encounter for screening mammogram for malignant neoplasm of breast: Secondary | ICD-10-CM | POA: Diagnosis not present

## 2023-02-26 ENCOUNTER — Other Ambulatory Visit (HOSPITAL_COMMUNITY): Payer: Self-pay

## 2023-02-28 ENCOUNTER — Other Ambulatory Visit (HOSPITAL_COMMUNITY): Payer: Self-pay

## 2023-03-01 ENCOUNTER — Other Ambulatory Visit: Payer: Self-pay | Admitting: Internal Medicine

## 2023-03-01 ENCOUNTER — Other Ambulatory Visit (HOSPITAL_COMMUNITY): Payer: Self-pay

## 2023-03-01 DIAGNOSIS — M810 Age-related osteoporosis without current pathological fracture: Secondary | ICD-10-CM | POA: Diagnosis not present

## 2023-03-01 DIAGNOSIS — Z1211 Encounter for screening for malignant neoplasm of colon: Secondary | ICD-10-CM | POA: Diagnosis not present

## 2023-03-01 DIAGNOSIS — I5043 Acute on chronic combined systolic (congestive) and diastolic (congestive) heart failure: Secondary | ICD-10-CM | POA: Diagnosis not present

## 2023-03-01 DIAGNOSIS — R350 Frequency of micturition: Secondary | ICD-10-CM | POA: Diagnosis not present

## 2023-03-05 ENCOUNTER — Inpatient Hospital Stay
Admission: RE | Admit: 2023-03-05 | Discharge: 2023-03-05 | Discharge status: Home / Self Care | Payer: PPO | Source: Ambulatory Visit | Attending: Internal Medicine | Admitting: Internal Medicine

## 2023-03-05 DIAGNOSIS — E2839 Other primary ovarian failure: Secondary | ICD-10-CM | POA: Diagnosis not present

## 2023-03-05 DIAGNOSIS — M8588 Other specified disorders of bone density and structure, other site: Secondary | ICD-10-CM | POA: Diagnosis not present

## 2023-03-05 DIAGNOSIS — M810 Age-related osteoporosis without current pathological fracture: Secondary | ICD-10-CM

## 2023-03-05 DIAGNOSIS — N958 Other specified menopausal and perimenopausal disorders: Secondary | ICD-10-CM | POA: Diagnosis not present

## 2023-03-12 ENCOUNTER — Other Ambulatory Visit: Payer: Self-pay

## 2023-03-22 ENCOUNTER — Telehealth: Payer: Self-pay

## 2023-03-22 NOTE — Telephone Encounter (Signed)
Pt called stating that her PCP informed her that she has CKD 3A and wanted to let MD know.

## 2023-03-27 ENCOUNTER — Other Ambulatory Visit: Payer: Self-pay | Admitting: Student

## 2023-03-27 DIAGNOSIS — R109 Unspecified abdominal pain: Secondary | ICD-10-CM

## 2023-03-28 ENCOUNTER — Ambulatory Visit
Admission: RE | Admit: 2023-03-28 | Discharge: 2023-03-28 | Disposition: A | Discharge status: Home / Self Care | Payer: PPO | Source: Ambulatory Visit | Attending: Student | Admitting: Student

## 2023-03-28 DIAGNOSIS — R109 Unspecified abdominal pain: Secondary | ICD-10-CM

## 2023-04-03 ENCOUNTER — Other Ambulatory Visit (HOSPITAL_COMMUNITY): Payer: Self-pay

## 2023-04-03 ENCOUNTER — Telehealth: Payer: Self-pay

## 2023-04-03 DIAGNOSIS — E785 Hyperlipidemia, unspecified: Secondary | ICD-10-CM

## 2023-04-03 DIAGNOSIS — I25119 Atherosclerotic heart disease of native coronary artery with unspecified angina pectoris: Secondary | ICD-10-CM

## 2023-04-03 DIAGNOSIS — I2510 Atherosclerotic heart disease of native coronary artery without angina pectoris: Secondary | ICD-10-CM

## 2023-04-03 NOTE — Telephone Encounter (Signed)
Pharmacy Patient Advocate Encounter   Received notification from CoverMyMeds that prior authorization for PRALUENT is required/requested.   Insurance verification completed.   The patient is insured through [facility] ADVANTAGE/RX ADVANCE .   Per test claim: PA required; However, NEW/RECENT labs/notes are needed to complete & submit PA request. Please see below.  PLAN IS REQUESTING LIPID LABS SHOWING A POSITIVE RESPONSE TO THERAPY. PLEASE ADVISE

## 2023-04-04 NOTE — Telephone Encounter (Signed)
I found labs in [facility] from oct 2024. LDL-C 56. Emailed labs to Rx PA team to submit.

## 2023-04-04 NOTE — Telephone Encounter (Signed)
Thanks Melissa! Labs have been submitted.

## 2023-04-05 ENCOUNTER — Other Ambulatory Visit (HOSPITAL_COMMUNITY): Payer: Self-pay

## 2023-04-05 NOTE — Telephone Encounter (Signed)
Pharmacy Patient Advocate Encounter  Received notification from [facility] ADVANTAGE/RX ADVANCE that Prior Authorization for PRALUENT has been APPROVED from 04/04/23 to 04/03/24. Ran test claim, Copay is $47. This test claim was processed through [facility]- copay amounts may vary at other pharmacies due to pharmacy/plan contracts, or as the patient moves through the different stages of their insurance plan.

## 2023-04-08 ENCOUNTER — Other Ambulatory Visit: Payer: Self-pay

## 2023-04-08 ENCOUNTER — Encounter (HOSPITAL_COMMUNITY): Payer: Self-pay

## 2023-04-08 ENCOUNTER — Other Ambulatory Visit (HOSPITAL_COMMUNITY): Payer: Self-pay

## 2023-04-08 MED ORDER — PRALUENT 150 MG/ML ~~LOC~~ SOAJ
150.0000 mg | SUBCUTANEOUS | 1 refills | Status: DC
Start: 2023-04-08 — End: 2023-07-26
  Filled 2023-04-08: qty 6, 84d supply, fill #0
  Filled 2023-06-05 – 2023-06-13 (×3): qty 6, 84d supply, fill #1

## 2023-04-08 NOTE — Addendum Note (Signed)
Addended by: Cheree Ditto on: 04/08/2023 01:34 PM   Modules accepted: Orders

## 2023-04-10 ENCOUNTER — Other Ambulatory Visit (HOSPITAL_COMMUNITY): Payer: Self-pay | Admitting: Internal Medicine

## 2023-05-06 ENCOUNTER — Other Ambulatory Visit: Payer: Self-pay | Admitting: Internal Medicine

## 2023-05-07 ENCOUNTER — Ambulatory Visit (HOSPITAL_COMMUNITY)
Admission: RE | Admit: 2023-05-07 | Discharge: 2023-05-07 | Disposition: A | Discharge status: Home / Self Care | Payer: PPO | Source: Ambulatory Visit | Attending: Internal Medicine | Admitting: Internal Medicine

## 2023-05-07 ENCOUNTER — Encounter (HOSPITAL_COMMUNITY): Payer: Self-pay | Admitting: Internal Medicine

## 2023-05-07 ENCOUNTER — Other Ambulatory Visit: Payer: Self-pay | Admitting: *Deleted

## 2023-05-07 VITALS — BP 102/52 | HR 63 | Wt 114.4 lb

## 2023-05-07 DIAGNOSIS — Z85118 Personal history of other malignant neoplasm of bronchus and lung: Secondary | ICD-10-CM | POA: Insufficient documentation

## 2023-05-07 DIAGNOSIS — R109 Unspecified abdominal pain: Secondary | ICD-10-CM | POA: Insufficient documentation

## 2023-05-07 DIAGNOSIS — K559 Vascular disorder of intestine, unspecified: Secondary | ICD-10-CM

## 2023-05-07 DIAGNOSIS — Z7902 Long term (current) use of antithrombotics/antiplatelets: Secondary | ICD-10-CM | POA: Insufficient documentation

## 2023-05-07 DIAGNOSIS — I2510 Atherosclerotic heart disease of native coronary artery without angina pectoris: Secondary | ICD-10-CM | POA: Insufficient documentation

## 2023-05-07 DIAGNOSIS — Z79899 Other long term (current) drug therapy: Secondary | ICD-10-CM | POA: Diagnosis not present

## 2023-05-07 DIAGNOSIS — Z7984 Long term (current) use of oral hypoglycemic drugs: Secondary | ICD-10-CM | POA: Diagnosis not present

## 2023-05-07 DIAGNOSIS — R1033 Periumbilical pain: Secondary | ICD-10-CM

## 2023-05-07 DIAGNOSIS — E785 Hyperlipidemia, unspecified: Secondary | ICD-10-CM | POA: Insufficient documentation

## 2023-05-07 DIAGNOSIS — Z952 Presence of prosthetic heart valve: Secondary | ICD-10-CM | POA: Insufficient documentation

## 2023-05-07 DIAGNOSIS — R0989 Other specified symptoms and signs involving the circulatory and respiratory systems: Secondary | ICD-10-CM | POA: Diagnosis not present

## 2023-05-07 DIAGNOSIS — I739 Peripheral vascular disease, unspecified: Secondary | ICD-10-CM | POA: Diagnosis not present

## 2023-05-07 DIAGNOSIS — I5042 Chronic combined systolic (congestive) and diastolic (congestive) heart failure: Secondary | ICD-10-CM | POA: Diagnosis not present

## 2023-05-07 DIAGNOSIS — I255 Ischemic cardiomyopathy: Secondary | ICD-10-CM | POA: Diagnosis not present

## 2023-05-07 DIAGNOSIS — J449 Chronic obstructive pulmonary disease, unspecified: Secondary | ICD-10-CM | POA: Diagnosis not present

## 2023-05-07 DIAGNOSIS — I080 Rheumatic disorders of both mitral and aortic valves: Secondary | ICD-10-CM | POA: Diagnosis not present

## 2023-05-07 DIAGNOSIS — Z87891 Personal history of nicotine dependence: Secondary | ICD-10-CM | POA: Diagnosis not present

## 2023-05-07 MED ORDER — BISOPROLOL FUMARATE 5 MG PO TABS
2.5000 mg | ORAL_TABLET | Freq: Every day | ORAL | 0 refills | Status: DC
  Filled 2023-06-03 – 2023-07-13 (×10): qty 45, 90d supply, fill #0

## 2023-05-07 NOTE — Progress Notes (Signed)
 PCP: Lorenda Ishihara, MD Primary cardiologist: Dr. Clifton James  HF Cardiologist: Dr Gala Romney    HPI: Ms Obarr is a 73 y.o. woman year old with history of COPD, lung CA s/p RUL resection 2014, CAD (prior stenting of the LM, LAD and circumflex in the setting of dissection), AS s/p TAVR 6/20 and systolic HF with recovered EF.    1 yr TAVR echo: Echo 7/21 EF 40-45%, normally functioning TAVR with a mean gradient of 14 mmHg and trivial-mild PVL. 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 Mild MR.  Seen in 5/22 for unscheduled visit due to CP. Had mild volume overload and given additional lasix.   Echo  04/21/21 EF 50-55% apical aneurysm.   S/p infrarenal abdominal aortic endarterectomy with aortobifemoral bypass & bilateral common femoral artery endarterectomies by Dr. Lenell Antu on 09/20/2021  Returns for f/u. Says she has been struggling with post prandial ab pain. After she eats feels sick to her stomach "behind her belly button" Gets better when she has a BM. Last week EGD/colon which were normal. Had CT scan which showed occlusion of native aorta. There was an aorto bi femoral bypass. Significant luminal narrowing at the proximal anastomosis. The aortic lumen measures 0.8 x 0.8 mm. Had angiogram which showed that anastomosis was ok. No CP or SOB. No melena or BRBPR.  SBP runnining in 90s   Echo 9/24 EF 45-50% TAVR valve ok Personally reviewed    Cardiac Studies:   - Echo 04/21/21 EF 50-55% apical aneurysm.   - Echo 09/24/18 EF 30-35%. TAVR ok. Mild MR.   - Echo 08/2018 EF 25% , severe aortic stenosis, severe mitral regurgitation.    - LHC 08/19/2018 1. Triple vessel CAD.  2. Patent stents in the left main, LAD and Circumflex (placed at the time of dissection) 3. Chronic occlusion small Obtuse marginal branch which fills from right to left collaterals 4. Patent mid to distal RCA stent. The proximal RCA has a moderate non-obstructive stenosis which does not appear to  be flow limiting. This is unchanged from her last cath.  5. Severe aortic stenosis (mean gradient 21 mmHg, peak to peak gradient 36 mmHg, AVA 0.71 cm2) 6. Elevated filling pressures c/w acute volume overload (RA 9/14,   ROS: All systems negative except as listed in HPI, PMH and Problem List.  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  Tobacco Use   Smoking status: Former    Current packs/day: 0.00    Average packs/day: 0.5 packs/day for 40.0 years (20.0 ttl pk-yrs)    Types: Cigarettes    Start date: 10/08/1972    Quit date: 10/08/2012    Years since quitting: 10.5   Smokeless tobacco: Never  Vaping Use   Vaping status: Never Used  Substance and Sexual Activity   Alcohol use: No   Drug use: No   Sexual activity: Yes  Other Topics Concern   Not on file  Social History Narrative   Not on file   Social Drivers of Health   Financial Resource Strain: Low Risk  (10/07/2018)   Overall Financial Resource Strain (CARDIA)    Difficulty of Paying Living Expenses: Not hard at all  Food Insecurity: No Food Insecurity (08/30/2022)   Hunger Vital  Sign    Worried About Programme researcher, broadcasting/film/video in the Last Year: Never true    Ran Out of Food in the Last Year: Never true  Transportation Needs: No Transportation Needs (08/30/2022)   PRAPARE - Administrator, Civil Service (Medical): No    Lack of Transportation (Non-Medical): No  Physical Activity: Insufficiently Active (10/07/2018)   Exercise Vital Sign    Days of Exercise per Week: 4 days    Minutes of Exercise per Session: 30 min  Stress: No Stress Concern Present (10/07/2018)   [employer] of Occupational Health - Occupational Stress Questionnaire    Feeling of Stress : Only a little  Social Connections: Not on file   Intimate Partner Violence: Not At Risk (08/30/2022)   Humiliation, Afraid, Rape, and Kick questionnaire    Fear of Current or Ex-Partner: No    Emotionally Abused: No    Physically Abused: No    Sexually Abused: No    FH:  Family History  Problem Relation Age of Onset   Alcohol abuse Father    Lupus Mother    Other Sister        Degenerative disc disease   Migraines Sister    Pulmonary fibrosis Brother    Heart attack Neg Hx     Past Medical History:  Diagnosis Date   AAA (abdominal aortic aneurysm) (HCC)    a. small by cath 2014.   Acute on chronic combined systolic and diastolic heart failure (HCC) 08/19/2018   Arthritis    "across my hips; buttocks; comes w/the weather" (02/27/2013)   CAD (coronary artery disease), native coronary artery    a. 10/2012 - rotational atherectomy of LAD c/b dissection s/p PCI to LAD, LCx, LM. b. 12/2012 - s/p overlapping DES to RCA.   Cancer [facility])    Cataract    "just the beginnings on the right" (02/27/2013)   Chronic combined systolic and diastolic CHF (congestive heart failure) (HCC)    COPD (chronic obstructive pulmonary disease) (HCC)    Coronary artery disease    GERD (gastroesophageal reflux disease)    Hypercholesterolemia    Ischemic cardiomyopathy    a. EF 45-50%.   Mass of lung    "small tumor RUL; they are watching it" (02/27/2013)   Mitral regurgitation    Peripheral vascular disease, unspecified (HCC)    Pneumonia    S/P TAVR (transcatheter aortic valve replacement)    23 mm Edwards Sapien 3 transcatheter heart valve placed via open left transaxillary approach    Severe aortic stenosis    Severe mitral regurgitation    Tachycardia, unspecified    Tobacco abuse     Current Outpatient Medications  Medication Sig Dispense Refill   acetaminophen (TYLENOL) 500 MG tablet Take 1,000 mg by mouth every 6 (six) hours as needed for moderate pain.     albuterol (PROVENTIL) (2.5 MG/3ML) 0.083% nebulizer solution Take 2.5 mg by  nebulization every 6 (six) hours as needed for shortness of breath or wheezing.     albuterol (VENTOLIN HFA) 108 (90 Base) MCG/ACT inhaler Inhale 2 puffs into the lungs every 6 (six) hours as needed for wheezing or shortness of breath.     alendronate (FOSAMAX) 70 MG tablet Take 70 mg by mouth once a week. Take with a full glass of water on an empty stomach.     Alirocumab (PRALUENT) 150 MG/ML SOAJ Inject 1 mL (150 mg total) into the skin every 14 (fourteen) days. 6 mL 1  bisoprolol (ZEBETA) 5 MG tablet TAKE 1/2 TABLET BY MOUTH EVERY DAY 90 tablet 0   Calcium Carb-Cholecalciferol (CALCIUM 600 + D PO) Take 1 tablet by mouth daily.     cholecalciferol (VITAMIN D3) 25 MCG (1000 UNIT) tablet Take 1,000 Units by mouth daily.     clindamycin (CLEOCIN) 150 MG capsule Take 600 mg by mouth See admin instructions. Take 600 mg by mouth 1 hour prior to dental appointment     clopidogrel (PLAVIX) 75 MG tablet Take 1 tablet (75 mg total) by mouth daily. 90 tablet 3   empagliflozin (JARDIANCE) 10 MG TABS tablet Take 1 tablet (10 mg total) by mouth daily. 90 tablet 3   famotidine (ZANTAC 360) 10 MG tablet Take 10 mg by mouth daily as needed for heartburn or indigestion.     Fluticasone-Umeclidin-Vilant (TRELEGY ELLIPTA) 100-62.5-25 MCG/INH AEPB Inhale 1 puff into the lungs daily. 60 each 11   furosemide (LASIX) 40 MG tablet TAKE 1 TABLET BY MOUTH AS NEEDED FOR FLUID OR EDMA 90 tablet 0   Hypromellose 0.2 % SOLN Place 2 drops into both eyes 4 (four) times daily as needed (dry eyes).      lansoprazole (PREVACID) 30 MG capsule Take 30 mg by mouth in the morning.     NASAL SALINE NA Place 1 spray into the nose every 4 (four) hours as needed (congestion).     nitroGLYCERIN (NITROSTAT) 0.4 MG SL tablet DISSOLVE ONE TABLET UNDER TONGUE AS NEEDED FOR CHEST PAIN EVERY 5 MINUTES 25 tablet 6   rosuvastatin (CRESTOR) 5 MG tablet Take 1 tablet (5 mg total) by mouth 3 (three) times a week. 90 tablet 3   sacubitril-valsartan  (ENTRESTO) 24-26 MG Take 1 tablet by mouth 2 (two) times daily. 180 tablet 3   simethicone (MYLICON) 80 MG chewable tablet Chew 2 tablets (160 mg total) by mouth every 6 (six) hours as needed for flatulence. 30 tablet 0   Spacer/Aero-Holding Chambers (AEROCHAMBER MV) inhaler Use as instructed 1 each 0   spironolactone (ALDACTONE) 25 MG tablet Take 0.5 tablets (12.5 mg total) by mouth daily. 45 tablet 3   No current facility-administered medications for this encounter.    BP (!) 102/52   Pulse 63   Wt 51.9 kg (114 lb 6.4 oz)   LMP 10/04/1999   SpO2 97%   BMI 23.11 kg/m   Wt Readings from Last 3 Encounters:  05/07/23 51.9 kg (114 lb 6.4 oz)  08/30/22 52.6 kg (116 lb)  08/24/22 52.6 kg (116 lb)   PHYSICAL EXAM: General:  Elderly No resp difficulty HEENT: normal Neck: supple. no JVD. Carotids 2+ bilat; + bruits. No lymphadenopathy or thryomegaly appreciated. Cor: PMI nondisplaced. Regular rate & rhythm. 2/6 AS Lungs: clear Abdomen: soft, nontender, nondistended. No hepatosplenomegaly. No bruits or masses. Good bowel sounds. Extremities: no cyanosis, clubbing, rash, edema Neuro: alert & orientedx3, cranial nerves grossly intact. moves all 4 extremities w/o difficulty. Affect pleasant   ECG SB 55 bpm anteroseptal qs Personally reviewed   ASSESSMENT & PLAN:  1. Chronic Systolic HF. ICM + valvular heart disease - previous ECHO with EF 40-45%.  - Echo (6/20): EF 20-25% with severe MR - Echo (1/21): EF 50% Apex hypokinetic - Echo (7/21): EF 40-45%, normal TAVR mean gradient 14 mmHg and trivial-mild PVL. 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 Mild MR. - Echo (2/23): EF 50-55% apical aneurysm. TAVR valve (Sapien) ok - Echo 7/23 EF 55-60% TAVR ok  - Echo 9/24  EF 45-50% TAVR valve ok Personally reviewed - NYHA II Volume status ok   - Continue Lasix 20 mg 3x/week  - Continue spironolactone 25 mg daily.  - Continue bisoprolol 2.5 mg daily.  - With low BP will  stop ENtresto 24/26 bid today. Can stop other meds as needed - Continue Jardiance 10 mg daily. - Labs today  2. Post prandial ab pain - I am worried this is mesenteric ischemia - I will stop Entresto to help with perfusion pressure - I called Dr. Lenell Antu ([facility]) personally. Will see her soon and evaluate further.    3. Severe AS - S/P TAVR on 08/26/2018  - Continue Plavix.  - Stable on recent echo - Reminded of need for SBE prophylaxis   3. Severe MR -> Mild MR - Functional. - Improved with TAVR and treatment of HF  4. CAD - Patent stents on LHC 6/20  - No s/s ischemia - Follows with Dr. Eldridge Dace   5. COPD  - Quit smoking 2014. - Followed by Dr. Sherene Sires   6. H/O Lung Cancer  - s/p resection 2014.  7. Carotid bruits - Radiated from AoV - Carotid US 6/20 1-39% bilaterally  8. Hyperlipidemia - Per PCP   9. PAD  - Followed by Dr. Lenell Antu - now s/p aortobifemoral bypass - Plan as above with evaluation for intestinal angina  I spent a total of 45 minutes today: 1) reviewing the patient's medical records including previous charts, labs and recent notes from other providers; 2) examining the patient and counseling them on their medical issues/explaining the plan of care; 3) adjusting meds as needed and 4) ordering lab work or other needed tests.    Arvilla Meres, MD  11:49 AM

## 2023-05-07 NOTE — Patient Instructions (Signed)
 Great to see you today!!!  STOP Entresto  You have been referred to follow-up with Dr Butch Penny at [facility], they will call you for an appointment  CONGRATULATIONS!!! You have graduated the [facility] Failure [facility], please follow-up with [facility]

## 2023-05-09 ENCOUNTER — Ambulatory Visit: Admitting: Physician Assistant

## 2023-05-09 ENCOUNTER — Ambulatory Visit (HOSPITAL_COMMUNITY)
Admission: RE | Admit: 2023-05-09 | Discharge: 2023-05-09 | Disposition: A | Discharge status: Home / Self Care | Source: Ambulatory Visit | Attending: Vascular Surgery | Admitting: Vascular Surgery

## 2023-05-09 ENCOUNTER — Other Ambulatory Visit: Payer: Self-pay

## 2023-05-09 ENCOUNTER — Other Ambulatory Visit (HOSPITAL_COMMUNITY): Payer: Self-pay

## 2023-05-09 VITALS — BP 128/49 | HR 64 | Temp 97.2°F | Ht 59.0 in | Wt 114.5 lb

## 2023-05-09 DIAGNOSIS — R109 Unspecified abdominal pain: Secondary | ICD-10-CM | POA: Insufficient documentation

## 2023-05-09 DIAGNOSIS — K551 Chronic vascular disorders of intestine: Secondary | ICD-10-CM

## 2023-05-09 MED ORDER — ROSUVASTATIN CALCIUM 5 MG PO TABS
5.0000 mg | ORAL_TABLET | ORAL | 3 refills | Status: AC
  Filled 2023-06-03: qty 39, 91d supply, fill #0
  Filled 2023-08-03: qty 90, 210d supply, fill #0
  Filled 2023-08-05: qty 36, 84d supply, fill #0
  Filled 2024-01-31 (×2): qty 36, 84d supply, fill #1

## 2023-05-09 NOTE — Progress Notes (Signed)
 HISTORY AND PHYSICAL     CC:  follow up. Requesting Provider:  Lorenda Ishihara,*  HPI: This is a 73 y.o. female who is here today for follow up for PAD.  Pt has hx of ABF bypass grafting and bilateral CFA endarterectomies on 09/20/2021 by Dr. Lenell Antu for small aortic aneurysm and aortoiliac occlusive disease.   Pt was last seen 06/19/2022 and at that time, she had a CT scan that showed a ventral hernia with plans for elective repair with Dr. Sheliah Hatch.  There was anastomotic stenosis seen on CTA.  Given that was not flow limiting he elected to observe this.  She has f/u appt with Dr. Lenell Antu at the end of May with ABI.  The pt returns today for follow up.  Pt was seen by Dr. Milas Kocher on 05/07/2023 and at that time, she was c/o post prandial abdominal pain.  She was urgently referred for evaluation.    She states she has been having abdominal pain after eating since August but has progressively been getting worse.  She states that it starts about 15-30 minutes after she eats and lasts until she is able to have a BM.  She does have some fear of eating due to the pain and has had about a 5 lb weight loss.   She states that her legs are doing great and denies any claudication, rest pain or non healing wounds.   Her walking is not inhibited.    She denies any visual changes, speech difficulties, unilateral weakness, numbness or paralysis.    The pt is on a statin for cholesterol management.    The pt is not on an aspirin.    Other AC:  Plavix The pt is not on medication for hypertension.  The pt is  on medication for diabetes. Tobacco hx:  former    Past Medical History:  Diagnosis Date   AAA (abdominal aortic aneurysm) (HCC)    a. small by cath 2014.   Acute on chronic combined systolic and diastolic heart failure (HCC) 08/19/2018   Arthritis    "across my hips; buttocks; comes w/the weather" (02/27/2013)   CAD (coronary artery disease), native coronary artery    a. 10/2012 -  rotational atherectomy of LAD c/b dissection s/p PCI to LAD, LCx, LM. b. 12/2012 - s/p overlapping DES to RCA.   Cancer [facility])    Cataract    "just the beginnings on the right" (02/27/2013)   Chronic combined systolic and diastolic CHF (congestive heart failure) (HCC)    COPD (chronic obstructive pulmonary disease) (HCC)    Coronary artery disease    GERD (gastroesophageal reflux disease)    Hypercholesterolemia    Ischemic cardiomyopathy    a. EF 45-50%.   Mass of lung    "small tumor RUL; they are watching it" (02/27/2013)   Mitral regurgitation    Peripheral vascular disease, unspecified (HCC)    Pneumonia    S/P TAVR (transcatheter aortic valve replacement)    23 mm Edwards Sapien 3 transcatheter heart valve placed via open left transaxillary approach    Severe aortic stenosis    Severe mitral regurgitation    Tachycardia, unspecified    Tobacco abuse     Past Surgical History:  Procedure Laterality Date   ABDOMINAL AORTOGRAM W/LOWER EXTREMITY N/A 08/03/2021   Procedure: ABDOMINAL AORTOGRAM W/LOWER EXTREMITY;  Surgeon: Cephus Shelling, MD;  Location: [facility] INVASIVE CV LAB;  Service: Cardiovascular;  Laterality: N/A;   ABDOMINAL AORTOGRAM W/LOWER EXTREMITY N/A 04/13/2022  Procedure: ABDOMINAL AORTOGRAM W/LOWER EXTREMITY;  Surgeon: Leonie Douglas, MD;  Location: [facility] INVASIVE CV LAB;  Service: Cardiovascular;  Laterality: N/A;   AORTA - BILATERAL FEMORAL ARTERY BYPASS GRAFT Bilateral 09/20/2021   Procedure: AORTA BIFEMORAL BYPASS GRAFT WITH EPIDURAL CATHETER and  aortic endartarectomey and bilateral femoral endartarectomies.;  Surgeon: Leonie Douglas, MD;  Location: [facility] OR;  Service: Vascular;  Laterality: Bilateral;   CARDIAC CATHETERIZATION  08/2018   CORONARY ANGIOPLASTY WITH STENT PLACEMENT  10/2012; 12/08/2012   "7 + 3" (02/27/2013)   ESOPHAGOGASTRODUODENOSCOPY (EGD) WITH PROPOFOL N/A 01/11/2015   Procedure: ESOPHAGOGASTRODUODENOSCOPY (EGD) WITH PROPOFOL;  Surgeon: Charolett Bumpers, MD;  Location: [facility] ENDOSCOPY;  Service: Endoscopy;  Laterality: N/A;   FLEXIBLE BRONCHOSCOPY N/A 03/02/2013   Procedure: FLEXIBLE BRONCHOSCOPY;  Surgeon: Alleen Borne, MD;  Location: [facility] OR;  Service: Thoracic;  Laterality: N/A;   FLEXIBLE SIGMOIDOSCOPY N/A 01/11/2015   Procedure: FLEXIBLE SIGMOIDOSCOPY;  Surgeon: Charolett Bumpers, MD;  Location: [facility] ENDOSCOPY;  Service: Endoscopy;  Laterality: N/A;  unable to complete colon-prep issues   GANGLION CYST EXCISION Left 1975   "wrist"   INCISIONAL HERNIA REPAIR N/A 08/30/2022   Procedure: INCISIONAL HERNIA REPAIR WITH MESH;  Surgeon: Kinsinger, [name], MD;  Location: [facility] ORS;  Service: General;  Laterality: N/A;   LEFT HEART CATH AND CORONARY ANGIOGRAPHY N/A 06/29/2016   Procedure: Left Heart Cath and Coronary Angiography;  Surgeon: Corky Crafts, MD;  Location: [facility] INVASIVE CV LAB;  Service: Cardiovascular;  Laterality: N/A;   LEFT HEART CATH AND CORONARY ANGIOGRAPHY N/A 08/19/2018   Procedure: LEFT HEART CATH AND CORONARY ANGIOGRAPHY;  Surgeon: Kathleene Hazel, MD;  Location: [facility] CV LAB;  Service: Cardiovascular;  Laterality: N/A;   LEFT HEART CATHETERIZATION WITH CORONARY ANGIOGRAM N/A 12/08/2012   Procedure: LEFT HEART CATHETERIZATION WITH CORONARY ANGIOGRAM;  Surgeon: Corky Crafts, MD;  Location: [facility] CATH LAB;  Service: Cardiovascular;  Laterality: N/A;   MOUTH SURGERY  2010?   "for bone loss" (06/24/2012)   PERCUTANEOUS CORONARY STENT INTERVENTION (PCI-S) N/A 10/09/2012   Procedure: PERCUTANEOUS CORONARY STENT INTERVENTION (PCI-S);  Surgeon: Corky Crafts, MD;  Location: [facility] CATH LAB;  Service: Cardiovascular;  Laterality: N/A;   RIGHT HEART CATH N/A 08/19/2018   Procedure: RIGHT HEART CATH;  Surgeon: Kathleene Hazel, MD;  Location: [facility] INVASIVE CV LAB;  Service: Cardiovascular;  Laterality: N/A;   TEE WITHOUT CARDIOVERSION N/A 08/22/2018   Procedure: TRANSESOPHAGEAL ECHOCARDIOGRAM (TEE);  Surgeon:  Chrystie Nose, MD;  Location: [facility] ENDOSCOPY;  Service: Cardiovascular;  Laterality: N/A;   TEE WITHOUT CARDIOVERSION N/A 08/26/2018   Procedure: TRANSESOPHAGEAL ECHOCARDIOGRAM (TEE);  Surgeon: Kathleene Hazel, MD;  Location: [facility] OR;  Service: Open Heart Surgery;  Laterality: N/A;   THORACOTOMY/LOBECTOMY Right 03/02/2013   Procedure: Right Video Assisted Thoracoscopy/Thoracotomy with upper Lobectomy;  Surgeon: Alleen Borne, MD;  Location: [facility] OR;  Service: Thoracic;  Laterality: Right;  Right Lung Upper  Lobectomy    TONSILLECTOMY  1960   VIDEO BRONCHOSCOPY  09/12/2011   Procedure: VIDEO BRONCHOSCOPY WITH FLUORO;  Surgeon: Nyoka Cowden, MD;  Location: [facility] ENDOSCOPY;  Service: Cardiopulmonary;  Laterality: Bilateral;    Allergies  Allergen Reactions   Azithromycin Shortness Of Breath    Other Reaction(s): burning rash/body   Cefaclor Rash and Shortness Of Breath    Other Reaction(s): breathing difficulty   Penicillins Other (See Comments)    Unknown from childhood Did it involve swelling of the face/tongue/throat, SOB, or low  BP? No Did it involve sudden or severe rash/hives, skin peeling, or any reaction on the inside of your mouth or nose? No Did you need to seek medical attention at a hospital or doctor's office? Yes When did it last happen? Infancy  If all above answers are "NO", may proceed with cephalosporin use.    Septra [Sulfamethoxazole-Trimethoprim] Shortness Of Breath and Rash   Doxycycline Swelling and Other (See Comments)    Redness on the face   Lipitor [Atorvastatin] Other (See Comments)    Severe muscle aches   Morphine And Codeine Nausea And Vomiting and Other (See Comments)    Terrible headache   Zetia [Ezetimibe] Other (See Comments)    Severe stomach pain   Zocor [Simvastatin] Other (See Comments)    Severe muscle aches   Adhesive [Tape] Other (See Comments)    Redness and swelling - use paper tape   Vicodin [Hydrocodone-Acetaminophen] Nausea And  Vomiting    Current Outpatient Medications  Medication Sig Dispense Refill   acetaminophen (TYLENOL) 500 MG tablet Take 1,000 mg by mouth every 6 (six) hours as needed for moderate pain.     albuterol (PROVENTIL) (2.5 MG/3ML) 0.083% nebulizer solution Take 2.5 mg by nebulization every 6 (six) hours as needed for shortness of breath or wheezing.     albuterol (VENTOLIN HFA) 108 (90 Base) MCG/ACT inhaler Inhale 2 puffs into the lungs every 6 (six) hours as needed for wheezing or shortness of breath.     alendronate (FOSAMAX) 70 MG tablet Take 70 mg by mouth once a week. Take with a full glass of water on an empty stomach.     Alirocumab (PRALUENT) 150 MG/ML SOAJ Inject 1 mL (150 mg total) into the skin every 14 (fourteen) days. 6 mL 1   bisoprolol (ZEBETA) 5 MG tablet Take 0.5 tablets (2.5 mg total) by mouth daily. 45 tablet 3   Calcium Carb-Cholecalciferol (CALCIUM 600 + D PO) Take 1 tablet by mouth daily.     cholecalciferol (VITAMIN D3) 25 MCG (1000 UNIT) tablet Take 1,000 Units by mouth daily.     clindamycin (CLEOCIN) 150 MG capsule Take 600 mg by mouth See admin instructions. Take 600 mg by mouth 1 hour prior to dental appointment     clopidogrel (PLAVIX) 75 MG tablet Take 1 tablet (75 mg total) by mouth daily. 90 tablet 3   empagliflozin (JARDIANCE) 10 MG TABS tablet Take 1 tablet (10 mg total) by mouth daily. 90 tablet 3   famotidine (ZANTAC 360) 10 MG tablet Take 10 mg by mouth daily as needed for heartburn or indigestion.     Fluticasone-Umeclidin-Vilant (TRELEGY ELLIPTA) 100-62.5-25 MCG/INH AEPB Inhale 1 puff into the lungs daily. 60 each 11   furosemide (LASIX) 40 MG tablet TAKE 1 TABLET BY MOUTH AS NEEDED FOR FLUID OR EDMA 90 tablet 0   Hypromellose 0.2 % SOLN Place 2 drops into both eyes 4 (four) times daily as needed (dry eyes).      lansoprazole (PREVACID) 30 MG capsule Take 30 mg by mouth in the morning.     NASAL SALINE NA Place 1 spray into the nose every 4 (four) hours as  needed (congestion).     nitroGLYCERIN (NITROSTAT) 0.4 MG SL tablet DISSOLVE ONE TABLET UNDER TONGUE AS NEEDED FOR CHEST PAIN EVERY 5 MINUTES 25 tablet 6   rosuvastatin (CRESTOR) 5 MG tablet Take 1 tablet (5 mg total) by mouth 3 (three) times a week. 90 tablet 3   simethicone (MYLICON) 80 MG  chewable tablet Chew 2 tablets (160 mg total) by mouth every 6 (six) hours as needed for flatulence. 30 tablet 0   Spacer/Aero-Holding Chambers (AEROCHAMBER MV) inhaler Use as instructed 1 each 0   spironolactone (ALDACTONE) 25 MG tablet Take 0.5 tablets (12.5 mg total) by mouth daily. 45 tablet 3   No current facility-administered medications for this visit.    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  Tobacco Use   Smoking status: Former    Current packs/day: 0.00    Average packs/day: 0.5 packs/day for 40.0 years (20.0 ttl pk-yrs)    Types: Cigarettes    Start date: 10/08/1972    Quit date: 10/08/2012    Years since quitting: 10.5   Smokeless tobacco: Never  Vaping Use   Vaping status: Never Used  Substance and Sexual Activity   Alcohol use: No   Drug use: No   Sexual activity: Yes  Other Topics Concern   Not on file  Social History Narrative   Not on file   Social Drivers of Health   Financial Resource Strain: Low Risk  (10/07/2018)   Overall Financial Resource Strain (CARDIA)    Difficulty of Paying Living Expenses: Not hard at all  Food Insecurity: No Food Insecurity (08/30/2022)   Hunger Vital Sign    Worried About Running Out of Food in the Last Year: Never true     Ran Out of Food in the Last Year: Never true  Transportation Needs: No Transportation Needs (08/30/2022)   PRAPARE - Administrator, Civil Service (Medical): No    Lack of Transportation (Non-Medical): No  Physical Activity: Insufficiently Active (10/07/2018)   Exercise Vital Sign    Days of Exercise per Week: 4 days    Minutes of Exercise per Session: 30 min  Stress: No Stress Concern Present (10/07/2018)   [employer] of Occupational Health - Occupational Stress Questionnaire    Feeling of Stress : Only a little  Social Connections: Not on file  Intimate Partner Violence: Not At Risk (08/30/2022)   Humiliation, Afraid, Rape, and Kick questionnaire    Fear of Current or Ex-Partner: No    Emotionally Abused: No    Physically Abused: No    Sexually Abused: No     REVIEW OF SYSTEMS:   [X]  denotes positive finding, [ ]  denotes negative finding Cardiac  Comments:  Chest pain or chest pressure:    Shortness of breath upon exertion:    Short of breath when lying flat:    Irregular heart rhythm:        Vascular    Pain in calf, thigh, or hip brought on by ambulation:    Pain in feet at night that wakes you up from your sleep:     Blood clot in your veins:    Leg swelling:     Pain with eating x See HPI  Pulmonary    Oxygen at home:  Productive cough:     Wheezing:         Neurologic    Sudden weakness in arms or legs:     Sudden numbness in arms or legs:     Sudden onset of difficulty speaking or slurred speech:    Temporary loss of vision in one eye:     Problems with dizziness:         Gastrointestinal    Blood in stool:     Vomited blood:         Genitourinary    Burning when urinating:     Blood in urine:        Psychiatric    Major depression:         Hematologic    Bleeding problems:    Problems with blood clotting too easily:        Skin    Rashes or ulcers:        Constitutional    Fever or chills:      PHYSICAL  EXAMINATION:  Today's Vitals   05/09/23 0831  BP: (!) 128/49  Pulse: 64  Temp: (!) 97.2 F (36.2 C)  TempSrc: Temporal  SpO2: 96%  Weight: 114 lb 8 oz (51.9 kg)  Height: 4\' 11"  (1.499 m)   Body mass index is 23.13 kg/m.   General:  WDWN in NAD; vital signs documented above Gait: Not observed HENT: WNL, normocephalic Pulmonary: normal non-labored breathing , without wheezing Cardiac: regular HR, with carotid bruit on the right Abdomen: soft, NT; aortic pulse is not palpable Skin: without rashes; well healed laparotomy and bilateral groin incisions Vascular Exam/Pulses:  Right Left  Radial 2+ (normal) 2+ (normal)  Femoral 2+ (normal) 2+ (normal)  DP 2+ (normal) 2+ (normal)   Extremities: without ischemic changes, without Gangrene , without cellulitis; without open wounds Musculoskeletal: no muscle wasting or atrophy  Neurologic: A&O X 3 Psychiatric:  The pt has Normal affect.   Non-Invasive Vascular Imaging:   Mesenteric duplex on 05/09/2023:  Duplex Findings:  +----------------------+--------+--------+------+--------------+  Mesenteric           PSV cm/sEDV cm/sPlaque   Comments     +----------------------+--------+--------+------+--------------+  Aorta Prox               48                  1.6 x 1.6 cm   +----------------------+--------+--------+------+--------------+  Celiac Artery Origin    180                                 +----------------------+--------+--------+------+--------------+  Celiac Artery Proximal  184                                 +----------------------+--------+--------+------+--------------+  SMA Origin              410                      >70%       +----------------------+--------+--------+------+--------------+  SMA Proximal            167                                 +----------------------+--------+--------+------+--------------+  SMA Mid  109                                  +----------------------+--------+--------+------+--------------+  SMA Distal              107                                 +----------------------+--------+--------+------+--------------+  CHA                    170                                 +----------------------+--------+--------+------+--------------+  Splenic                163                                 +----------------------+--------+--------+------+--------------+  IMA                                        Not visualized  +----------------------+--------+--------+------+--------------+     +------------------+--------+--------+-------+  Right Renal ArteryPSV cm/sEDV cm/sComment  +------------------+--------+--------+-------+  Origin             119                    +------------------+--------+--------+-------+   +-----------------+--------+--------+-------+  Left Renal ArteryPSV cm/sEDV cm/sComment  +-----------------+--------+--------+-------+  Origin            158                    +-----------------+--------+--------+-------+   Summary:  Mesenteric:  >70% stenosis of the superior mesenteric artery at the origin.  Patent celiac (mildly elevated), hepatic, splenic and bilateral renal arteries.  Unable to visualized inferior mesenteric artery.    Previous ABI's/TBI's on 06/19/2022: Right:  1.09/0.51 - Great toe pressure: 66 Left:  1.0/0.42 - Great toe pressure:  54  CTA 03/23/2022 IMPRESSION: VASCULAR   1. Interval aortobifemoral bypass. The native aorta and iliac system is now occluded. There is focal narrowing of the aorta at the proximal anastomosis with the lumen measuring only 0.8 x 0.8 cm. The distal anastomoses are widely patent. 2. Mild to moderate stenoses of the bilateral renal arteries and superior mesenteric artery. 3. Extensive atherosclerotic vascular calcifications throughout the native aorta and coronary arteries.   NON-VASCULAR   1.  Interval development of moderate right hydro ureteral nephrosis.The ureter abruptly narrows as it passes over the right aorto-femoral bypass limb. Findings suggest postsurgical scarring/compression. 2. Interval development of incisional hernia containing omental fat and a loop of small bowel in the low midline abdomen. No evidence of obstruction or inflammatory changes. 3. Chronic atrophy of the right kidney. 4. Additional ancillary findings as above.   ASSESSMENT/PLAN:: 73 y.o. female here for follow up for PAD with hx of ABF bypass grafting and bilateral CFA endarterectomies on 09/20/2021 by Dr. Lenell Antu for small aortic aneurysm and aortoiliac occlusive disease.  She is here today for evaluation for possible mesenteric ischemia.  Mesenteric ischemia -pt with post prandial pain, fear of food and mild weight loss, which could be indicative of mesenteric ischemia.  On  duplex today she have elevated velocities at the origin of the SMA.  On CTA January 2024, she had mild to moderate stenosis of the SMA.   -plan for mesenteric angiogram with Dr. Lenell Antu tomorrow.   -continue statin/plavix  PAD -pt with palpable pedal pulses bilaterally -she has f/u for this in May with Dr. Lenell Antu  Right carotid bruit -she is asymptomatic.  She had carotid duplex in 2020, which was 1-39% bilateral ICA stenosis.   -given the bruit, will need repeat carotid duplex-this will need to be scheduled in the near future.     Doreatha Massed, [facility] [phone]  Clinic MD:   Karin Lieu

## 2023-05-09 NOTE — H&P (View-Only) (Signed)
 HISTORY AND PHYSICAL     CC:  follow up. Requesting Provider:  Lorenda Ishihara,*  HPI: This is a 73 y.o. female who is here today for follow up for PAD.  Pt has hx of ABF bypass grafting and bilateral CFA endarterectomies on 09/20/2021 by Dr. Lenell Antu for small aortic aneurysm and aortoiliac occlusive disease.   Pt was last seen 06/19/2022 and at that time, she had a CT scan that showed a ventral hernia with plans for elective repair with Dr. Sheliah Hatch.  There was anastomotic stenosis seen on CTA.  Given that was not flow limiting he elected to observe this.  She has f/u appt with Dr. Lenell Antu at the end of May with ABI.  The pt returns today for follow up.  Pt was seen by Dr. Milas Kocher on 05/07/2023 and at that time, she was c/o post prandial abdominal pain.  She was urgently referred for evaluation.    She states she has been having abdominal pain after eating since August but has progressively been getting worse.  She states that it starts about 15-30 minutes after she eats and lasts until she is able to have a BM.  She does have some fear of eating due to the pain and has had about a 5 lb weight loss.   She states that her legs are doing great and denies any claudication, rest pain or non healing wounds.   Her walking is not inhibited.    She denies any visual changes, speech difficulties, unilateral weakness, numbness or paralysis.    The pt is on a statin for cholesterol management.    The pt is not on an aspirin.    Other AC:  Plavix The pt is not on medication for hypertension.  The pt is  on medication for diabetes. Tobacco hx:  former    Past Medical History:  Diagnosis Date   AAA (abdominal aortic aneurysm) (HCC)    a. small by cath 2014.   Acute on chronic combined systolic and diastolic heart failure (HCC) 08/19/2018   Arthritis    "across my hips; buttocks; comes w/the weather" (02/27/2013)   CAD (coronary artery disease), native coronary artery    a. 10/2012 -  rotational atherectomy of LAD c/b dissection s/p PCI to LAD, LCx, LM. b. 12/2012 - s/p overlapping DES to RCA.   Cancer [facility])    Cataract    "just the beginnings on the right" (02/27/2013)   Chronic combined systolic and diastolic CHF (congestive heart failure) (HCC)    COPD (chronic obstructive pulmonary disease) (HCC)    Coronary artery disease    GERD (gastroesophageal reflux disease)    Hypercholesterolemia    Ischemic cardiomyopathy    a. EF 45-50%.   Mass of lung    "small tumor RUL; they are watching it" (02/27/2013)   Mitral regurgitation    Peripheral vascular disease, unspecified (HCC)    Pneumonia    S/P TAVR (transcatheter aortic valve replacement)    23 mm Edwards Sapien 3 transcatheter heart valve placed via open left transaxillary approach    Severe aortic stenosis    Severe mitral regurgitation    Tachycardia, unspecified    Tobacco abuse     Past Surgical History:  Procedure Laterality Date   ABDOMINAL AORTOGRAM W/LOWER EXTREMITY N/A 08/03/2021   Procedure: ABDOMINAL AORTOGRAM W/LOWER EXTREMITY;  Surgeon: Cephus Shelling, MD;  Location: [facility] INVASIVE CV LAB;  Service: Cardiovascular;  Laterality: N/A;   ABDOMINAL AORTOGRAM W/LOWER EXTREMITY N/A 04/13/2022  Procedure: ABDOMINAL AORTOGRAM W/LOWER EXTREMITY;  Surgeon: Leonie Douglas, MD;  Location: [facility] INVASIVE CV LAB;  Service: Cardiovascular;  Laterality: N/A;   AORTA - BILATERAL FEMORAL ARTERY BYPASS GRAFT Bilateral 09/20/2021   Procedure: AORTA BIFEMORAL BYPASS GRAFT WITH EPIDURAL CATHETER and  aortic endartarectomey and bilateral femoral endartarectomies.;  Surgeon: Leonie Douglas, MD;  Location: [facility] OR;  Service: Vascular;  Laterality: Bilateral;   CARDIAC CATHETERIZATION  08/2018   CORONARY ANGIOPLASTY WITH STENT PLACEMENT  10/2012; 12/08/2012   "7 + 3" (02/27/2013)   ESOPHAGOGASTRODUODENOSCOPY (EGD) WITH PROPOFOL N/A 01/11/2015   Procedure: ESOPHAGOGASTRODUODENOSCOPY (EGD) WITH PROPOFOL;  Surgeon: Charolett Bumpers, MD;  Location: [facility] ENDOSCOPY;  Service: Endoscopy;  Laterality: N/A;   FLEXIBLE BRONCHOSCOPY N/A 03/02/2013   Procedure: FLEXIBLE BRONCHOSCOPY;  Surgeon: Alleen Borne, MD;  Location: [facility] OR;  Service: Thoracic;  Laterality: N/A;   FLEXIBLE SIGMOIDOSCOPY N/A 01/11/2015   Procedure: FLEXIBLE SIGMOIDOSCOPY;  Surgeon: Charolett Bumpers, MD;  Location: [facility] ENDOSCOPY;  Service: Endoscopy;  Laterality: N/A;  unable to complete colon-prep issues   GANGLION CYST EXCISION Left 1975   "wrist"   INCISIONAL HERNIA REPAIR N/A 08/30/2022   Procedure: INCISIONAL HERNIA REPAIR WITH MESH;  Surgeon: Kinsinger, [name], MD;  Location: [facility] ORS;  Service: General;  Laterality: N/A;   LEFT HEART CATH AND CORONARY ANGIOGRAPHY N/A 06/29/2016   Procedure: Left Heart Cath and Coronary Angiography;  Surgeon: Corky Crafts, MD;  Location: [facility] INVASIVE CV LAB;  Service: Cardiovascular;  Laterality: N/A;   LEFT HEART CATH AND CORONARY ANGIOGRAPHY N/A 08/19/2018   Procedure: LEFT HEART CATH AND CORONARY ANGIOGRAPHY;  Surgeon: Kathleene Hazel, MD;  Location: [facility] CV LAB;  Service: Cardiovascular;  Laterality: N/A;   LEFT HEART CATHETERIZATION WITH CORONARY ANGIOGRAM N/A 12/08/2012   Procedure: LEFT HEART CATHETERIZATION WITH CORONARY ANGIOGRAM;  Surgeon: Corky Crafts, MD;  Location: [facility] CATH LAB;  Service: Cardiovascular;  Laterality: N/A;   MOUTH SURGERY  2010?   "for bone loss" (06/24/2012)   PERCUTANEOUS CORONARY STENT INTERVENTION (PCI-S) N/A 10/09/2012   Procedure: PERCUTANEOUS CORONARY STENT INTERVENTION (PCI-S);  Surgeon: Corky Crafts, MD;  Location: [facility] CATH LAB;  Service: Cardiovascular;  Laterality: N/A;   RIGHT HEART CATH N/A 08/19/2018   Procedure: RIGHT HEART CATH;  Surgeon: Kathleene Hazel, MD;  Location: [facility] INVASIVE CV LAB;  Service: Cardiovascular;  Laterality: N/A;   TEE WITHOUT CARDIOVERSION N/A 08/22/2018   Procedure: TRANSESOPHAGEAL ECHOCARDIOGRAM (TEE);  Surgeon:  Chrystie Nose, MD;  Location: [facility] ENDOSCOPY;  Service: Cardiovascular;  Laterality: N/A;   TEE WITHOUT CARDIOVERSION N/A 08/26/2018   Procedure: TRANSESOPHAGEAL ECHOCARDIOGRAM (TEE);  Surgeon: Kathleene Hazel, MD;  Location: [facility] OR;  Service: Open Heart Surgery;  Laterality: N/A;   THORACOTOMY/LOBECTOMY Right 03/02/2013   Procedure: Right Video Assisted Thoracoscopy/Thoracotomy with upper Lobectomy;  Surgeon: Alleen Borne, MD;  Location: [facility] OR;  Service: Thoracic;  Laterality: Right;  Right Lung Upper  Lobectomy    TONSILLECTOMY  1960   VIDEO BRONCHOSCOPY  09/12/2011   Procedure: VIDEO BRONCHOSCOPY WITH FLUORO;  Surgeon: Nyoka Cowden, MD;  Location: [facility] ENDOSCOPY;  Service: Cardiopulmonary;  Laterality: Bilateral;    Allergies  Allergen Reactions   Azithromycin Shortness Of Breath    Other Reaction(s): burning rash/body   Cefaclor Rash and Shortness Of Breath    Other Reaction(s): breathing difficulty   Penicillins Other (See Comments)    Unknown from childhood Did it involve swelling of the face/tongue/throat, SOB, or low  BP? No Did it involve sudden or severe rash/hives, skin peeling, or any reaction on the inside of your mouth or nose? No Did you need to seek medical attention at a hospital or doctor's office? Yes When did it last happen? Infancy  If all above answers are "NO", may proceed with cephalosporin use.    Septra [Sulfamethoxazole-Trimethoprim] Shortness Of Breath and Rash   Doxycycline Swelling and Other (See Comments)    Redness on the face   Lipitor [Atorvastatin] Other (See Comments)    Severe muscle aches   Morphine And Codeine Nausea And Vomiting and Other (See Comments)    Terrible headache   Zetia [Ezetimibe] Other (See Comments)    Severe stomach pain   Zocor [Simvastatin] Other (See Comments)    Severe muscle aches   Adhesive [Tape] Other (See Comments)    Redness and swelling - use paper tape   Vicodin [Hydrocodone-Acetaminophen] Nausea And  Vomiting    Current Outpatient Medications  Medication Sig Dispense Refill   acetaminophen (TYLENOL) 500 MG tablet Take 1,000 mg by mouth every 6 (six) hours as needed for moderate pain.     albuterol (PROVENTIL) (2.5 MG/3ML) 0.083% nebulizer solution Take 2.5 mg by nebulization every 6 (six) hours as needed for shortness of breath or wheezing.     albuterol (VENTOLIN HFA) 108 (90 Base) MCG/ACT inhaler Inhale 2 puffs into the lungs every 6 (six) hours as needed for wheezing or shortness of breath.     alendronate (FOSAMAX) 70 MG tablet Take 70 mg by mouth once a week. Take with a full glass of water on an empty stomach.     Alirocumab (PRALUENT) 150 MG/ML SOAJ Inject 1 mL (150 mg total) into the skin every 14 (fourteen) days. 6 mL 1   bisoprolol (ZEBETA) 5 MG tablet Take 0.5 tablets (2.5 mg total) by mouth daily. 45 tablet 3   Calcium Carb-Cholecalciferol (CALCIUM 600 + D PO) Take 1 tablet by mouth daily.     cholecalciferol (VITAMIN D3) 25 MCG (1000 UNIT) tablet Take 1,000 Units by mouth daily.     clindamycin (CLEOCIN) 150 MG capsule Take 600 mg by mouth See admin instructions. Take 600 mg by mouth 1 hour prior to dental appointment     clopidogrel (PLAVIX) 75 MG tablet Take 1 tablet (75 mg total) by mouth daily. 90 tablet 3   empagliflozin (JARDIANCE) 10 MG TABS tablet Take 1 tablet (10 mg total) by mouth daily. 90 tablet 3   famotidine (ZANTAC 360) 10 MG tablet Take 10 mg by mouth daily as needed for heartburn or indigestion.     Fluticasone-Umeclidin-Vilant (TRELEGY ELLIPTA) 100-62.5-25 MCG/INH AEPB Inhale 1 puff into the lungs daily. 60 each 11   furosemide (LASIX) 40 MG tablet TAKE 1 TABLET BY MOUTH AS NEEDED FOR FLUID OR EDMA 90 tablet 0   Hypromellose 0.2 % SOLN Place 2 drops into both eyes 4 (four) times daily as needed (dry eyes).      lansoprazole (PREVACID) 30 MG capsule Take 30 mg by mouth in the morning.     NASAL SALINE NA Place 1 spray into the nose every 4 (four) hours as  needed (congestion).     nitroGLYCERIN (NITROSTAT) 0.4 MG SL tablet DISSOLVE ONE TABLET UNDER TONGUE AS NEEDED FOR CHEST PAIN EVERY 5 MINUTES 25 tablet 6   rosuvastatin (CRESTOR) 5 MG tablet Take 1 tablet (5 mg total) by mouth 3 (three) times a week. 90 tablet 3   simethicone (MYLICON) 80 MG  chewable tablet Chew 2 tablets (160 mg total) by mouth every 6 (six) hours as needed for flatulence. 30 tablet 0   Spacer/Aero-Holding Chambers (AEROCHAMBER MV) inhaler Use as instructed 1 each 0   spironolactone (ALDACTONE) 25 MG tablet Take 0.5 tablets (12.5 mg total) by mouth daily. 45 tablet 3   No current facility-administered medications for this visit.    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  Tobacco Use   Smoking status: Former    Current packs/day: 0.00    Average packs/day: 0.5 packs/day for 40.0 years (20.0 ttl pk-yrs)    Types: Cigarettes    Start date: 10/08/1972    Quit date: 10/08/2012    Years since quitting: 10.5   Smokeless tobacco: Never  Vaping Use   Vaping status: Never Used  Substance and Sexual Activity   Alcohol use: No   Drug use: No   Sexual activity: Yes  Other Topics Concern   Not on file  Social History Narrative   Not on file   Social Drivers of Health   Financial Resource Strain: Low Risk  (10/07/2018)   Overall Financial Resource Strain (CARDIA)    Difficulty of Paying Living Expenses: Not hard at all  Food Insecurity: No Food Insecurity (08/30/2022)   Hunger Vital Sign    Worried About Running Out of Food in the Last Year: Never true     Ran Out of Food in the Last Year: Never true  Transportation Needs: No Transportation Needs (08/30/2022)   PRAPARE - Administrator, Civil Service (Medical): No    Lack of Transportation (Non-Medical): No  Physical Activity: Insufficiently Active (10/07/2018)   Exercise Vital Sign    Days of Exercise per Week: 4 days    Minutes of Exercise per Session: 30 min  Stress: No Stress Concern Present (10/07/2018)   [employer] of Occupational Health - Occupational Stress Questionnaire    Feeling of Stress : Only a little  Social Connections: Not on file  Intimate Partner Violence: Not At Risk (08/30/2022)   Humiliation, Afraid, Rape, and Kick questionnaire    Fear of Current or Ex-Partner: No    Emotionally Abused: No    Physically Abused: No    Sexually Abused: No     REVIEW OF SYSTEMS:   [X]  denotes positive finding, [ ]  denotes negative finding Cardiac  Comments:  Chest pain or chest pressure:    Shortness of breath upon exertion:    Short of breath when lying flat:    Irregular heart rhythm:        Vascular    Pain in calf, thigh, or hip brought on by ambulation:    Pain in feet at night that wakes you up from your sleep:     Blood clot in your veins:    Leg swelling:     Pain with eating x See HPI  Pulmonary    Oxygen at home:  Productive cough:     Wheezing:         Neurologic    Sudden weakness in arms or legs:     Sudden numbness in arms or legs:     Sudden onset of difficulty speaking or slurred speech:    Temporary loss of vision in one eye:     Problems with dizziness:         Gastrointestinal    Blood in stool:     Vomited blood:         Genitourinary    Burning when urinating:     Blood in urine:        Psychiatric    Major depression:         Hematologic    Bleeding problems:    Problems with blood clotting too easily:        Skin    Rashes or ulcers:        Constitutional    Fever or chills:      PHYSICAL  EXAMINATION:  Today's Vitals   05/09/23 0831  BP: (!) 128/49  Pulse: 64  Temp: (!) 97.2 F (36.2 C)  TempSrc: Temporal  SpO2: 96%  Weight: 114 lb 8 oz (51.9 kg)  Height: 4\' 11"  (1.499 m)   Body mass index is 23.13 kg/m.   General:  WDWN in NAD; vital signs documented above Gait: Not observed HENT: WNL, normocephalic Pulmonary: normal non-labored breathing , without wheezing Cardiac: regular HR, with carotid bruit on the right Abdomen: soft, NT; aortic pulse is not palpable Skin: without rashes; well healed laparotomy and bilateral groin incisions Vascular Exam/Pulses:  Right Left  Radial 2+ (normal) 2+ (normal)  Femoral 2+ (normal) 2+ (normal)  DP 2+ (normal) 2+ (normal)   Extremities: without ischemic changes, without Gangrene , without cellulitis; without open wounds Musculoskeletal: no muscle wasting or atrophy  Neurologic: A&O X 3 Psychiatric:  The pt has Normal affect.   Non-Invasive Vascular Imaging:   Mesenteric duplex on 05/09/2023:  Duplex Findings:  +----------------------+--------+--------+------+--------------+  Mesenteric           PSV cm/sEDV cm/sPlaque   Comments     +----------------------+--------+--------+------+--------------+  Aorta Prox               48                  1.6 x 1.6 cm   +----------------------+--------+--------+------+--------------+  Celiac Artery Origin    180                                 +----------------------+--------+--------+------+--------------+  Celiac Artery Proximal  184                                 +----------------------+--------+--------+------+--------------+  SMA Origin              410                      >70%       +----------------------+--------+--------+------+--------------+  SMA Proximal            167                                 +----------------------+--------+--------+------+--------------+  SMA Mid  109                                  +----------------------+--------+--------+------+--------------+  SMA Distal              107                                 +----------------------+--------+--------+------+--------------+  CHA                    170                                 +----------------------+--------+--------+------+--------------+  Splenic                163                                 +----------------------+--------+--------+------+--------------+  IMA                                        Not visualized  +----------------------+--------+--------+------+--------------+     +------------------+--------+--------+-------+  Right Renal ArteryPSV cm/sEDV cm/sComment  +------------------+--------+--------+-------+  Origin             119                    +------------------+--------+--------+-------+   +-----------------+--------+--------+-------+  Left Renal ArteryPSV cm/sEDV cm/sComment  +-----------------+--------+--------+-------+  Origin            158                    +-----------------+--------+--------+-------+   Summary:  Mesenteric:  >70% stenosis of the superior mesenteric artery at the origin.  Patent celiac (mildly elevated), hepatic, splenic and bilateral renal arteries.  Unable to visualized inferior mesenteric artery.    Previous ABI's/TBI's on 06/19/2022: Right:  1.09/0.51 - Great toe pressure: 66 Left:  1.0/0.42 - Great toe pressure:  54  CTA 03/23/2022 IMPRESSION: VASCULAR   1. Interval aortobifemoral bypass. The native aorta and iliac system is now occluded. There is focal narrowing of the aorta at the proximal anastomosis with the lumen measuring only 0.8 x 0.8 cm. The distal anastomoses are widely patent. 2. Mild to moderate stenoses of the bilateral renal arteries and superior mesenteric artery. 3. Extensive atherosclerotic vascular calcifications throughout the native aorta and coronary arteries.   NON-VASCULAR   1.  Interval development of moderate right hydro ureteral nephrosis.The ureter abruptly narrows as it passes over the right aorto-femoral bypass limb. Findings suggest postsurgical scarring/compression. 2. Interval development of incisional hernia containing omental fat and a loop of small bowel in the low midline abdomen. No evidence of obstruction or inflammatory changes. 3. Chronic atrophy of the right kidney. 4. Additional ancillary findings as above.   ASSESSMENT/PLAN:: 73 y.o. female here for follow up for PAD with hx of ABF bypass grafting and bilateral CFA endarterectomies on 09/20/2021 by Dr. Lenell Antu for small aortic aneurysm and aortoiliac occlusive disease.  She is here today for evaluation for possible mesenteric ischemia.  Mesenteric ischemia -pt with post prandial pain, fear of food and mild weight loss, which could be indicative of mesenteric ischemia.  On  duplex today she have elevated velocities at the origin of the SMA.  On CTA January 2024, she had mild to moderate stenosis of the SMA.   -plan for mesenteric angiogram with Dr. Lenell Antu tomorrow.   -continue statin/plavix  PAD -pt with palpable pedal pulses bilaterally -she has f/u for this in May with Dr. Lenell Antu  Right carotid bruit -she is asymptomatic.  She had carotid duplex in 2020, which was 1-39% bilateral ICA stenosis.   -given the bruit, will need repeat carotid duplex-this will need to be scheduled in the near future.     Doreatha Massed, [facility] [phone]  Clinic MD:   Karin Lieu

## 2023-05-10 ENCOUNTER — Inpatient Hospital Stay (HOSPITAL_COMMUNITY)
Admission: RE | Admit: 2023-05-10 | Discharge: 2023-05-12 | DRG: 253 | Disposition: A | Discharge status: Home / Self Care | Attending: Vascular Surgery | Admitting: Vascular Surgery

## 2023-05-10 ENCOUNTER — Encounter (HOSPITAL_COMMUNITY)
Admission: RE | Disposition: A | Discharge status: Home / Self Care | Payer: Self-pay | Source: Home / Self Care | Attending: Vascular Surgery

## 2023-05-10 DIAGNOSIS — Z888 Allergy status to other drugs, medicaments and biological substances status: Secondary | ICD-10-CM

## 2023-05-10 DIAGNOSIS — Z880 Allergy status to penicillin: Secondary | ICD-10-CM

## 2023-05-10 DIAGNOSIS — R001 Bradycardia, unspecified: Secondary | ICD-10-CM | POA: Diagnosis not present

## 2023-05-10 DIAGNOSIS — K551 Chronic vascular disorders of intestine: Secondary | ICD-10-CM | POA: Diagnosis not present

## 2023-05-10 DIAGNOSIS — Z7902 Long term (current) use of antithrombotics/antiplatelets: Secondary | ICD-10-CM

## 2023-05-10 DIAGNOSIS — Z882 Allergy status to sulfonamides status: Secondary | ICD-10-CM

## 2023-05-10 DIAGNOSIS — E7800 Pure hypercholesterolemia, unspecified: Secondary | ICD-10-CM | POA: Diagnosis present

## 2023-05-10 DIAGNOSIS — I255 Ischemic cardiomyopathy: Secondary | ICD-10-CM | POA: Diagnosis present

## 2023-05-10 DIAGNOSIS — R0989 Other specified symptoms and signs involving the circulatory and respiratory systems: Secondary | ICD-10-CM | POA: Diagnosis present

## 2023-05-10 DIAGNOSIS — R350 Frequency of micturition: Secondary | ICD-10-CM | POA: Diagnosis not present

## 2023-05-10 DIAGNOSIS — Z79899 Other long term (current) drug therapy: Secondary | ICD-10-CM

## 2023-05-10 DIAGNOSIS — Z955 Presence of coronary angioplasty implant and graft: Secondary | ICD-10-CM

## 2023-05-10 DIAGNOSIS — Z7951 Long term (current) use of inhaled steroids: Secondary | ICD-10-CM

## 2023-05-10 DIAGNOSIS — I959 Hypotension, unspecified: Secondary | ICD-10-CM

## 2023-05-10 DIAGNOSIS — Z885 Allergy status to narcotic agent status: Secondary | ICD-10-CM

## 2023-05-10 DIAGNOSIS — Z881 Allergy status to other antibiotic agents status: Secondary | ICD-10-CM

## 2023-05-10 DIAGNOSIS — K219 Gastro-esophageal reflux disease without esophagitis: Secondary | ICD-10-CM | POA: Diagnosis present

## 2023-05-10 DIAGNOSIS — I5042 Chronic combined systolic (congestive) and diastolic (congestive) heart failure: Secondary | ICD-10-CM | POA: Diagnosis not present

## 2023-05-10 DIAGNOSIS — Z87891 Personal history of nicotine dependence: Secondary | ICD-10-CM

## 2023-05-10 DIAGNOSIS — I739 Peripheral vascular disease, unspecified: Secondary | ICD-10-CM | POA: Diagnosis not present

## 2023-05-10 DIAGNOSIS — Z9109 Other allergy status, other than to drugs and biological substances: Secondary | ICD-10-CM

## 2023-05-10 DIAGNOSIS — Z952 Presence of prosthetic heart valve: Secondary | ICD-10-CM

## 2023-05-10 DIAGNOSIS — E1151 Type 2 diabetes mellitus with diabetic peripheral angiopathy without gangrene: Secondary | ICD-10-CM | POA: Diagnosis present

## 2023-05-10 DIAGNOSIS — I9581 Postprocedural hypotension: Principal | ICD-10-CM | POA: Diagnosis present

## 2023-05-10 DIAGNOSIS — Z7984 Long term (current) use of oral hypoglycemic drugs: Secondary | ICD-10-CM

## 2023-05-10 DIAGNOSIS — M1990 Unspecified osteoarthritis, unspecified site: Secondary | ICD-10-CM | POA: Diagnosis present

## 2023-05-10 DIAGNOSIS — I2510 Atherosclerotic heart disease of native coronary artery without angina pectoris: Secondary | ICD-10-CM | POA: Diagnosis present

## 2023-05-10 DIAGNOSIS — N1831 Chronic kidney disease, stage 3a: Secondary | ICD-10-CM | POA: Diagnosis not present

## 2023-05-10 DIAGNOSIS — J449 Chronic obstructive pulmonary disease, unspecified: Secondary | ICD-10-CM | POA: Diagnosis present

## 2023-05-10 DIAGNOSIS — Z8679 Personal history of other diseases of the circulatory system: Secondary | ICD-10-CM

## 2023-05-10 DIAGNOSIS — Z7983 Long term (current) use of bisphosphonates: Secondary | ICD-10-CM

## 2023-05-10 DIAGNOSIS — Z85118 Personal history of other malignant neoplasm of bronchus and lung: Secondary | ICD-10-CM

## 2023-05-10 HISTORY — PX: VISCERAL ARTERY INTERVENTION: CATH118277

## 2023-05-10 HISTORY — PX: VISCERAL ANGIOGRAPHY: CATH118276

## 2023-05-10 LAB — BASIC METABOLIC PANEL
Anion gap: 11 (ref 5–15)
BUN: 24 mg/dL — ABNORMAL HIGH (ref 8–23)
CO2: 21 mmol/L — ABNORMAL LOW (ref 22–32)
Calcium: 8.1 mg/dL — ABNORMAL LOW (ref 8.9–10.3)
Chloride: 104 mmol/L (ref 98–111)
Creatinine, Ser: 0.98 mg/dL (ref 0.44–1.00)
GFR, Estimated: >60 mL/min (ref >60)
Glucose, Bld: 95 mg/dL (ref 70–99)
Potassium: 3.6 mmol/L (ref 3.5–5.1)
Sodium: 136 mmol/L (ref 135–145)

## 2023-05-10 LAB — CBC
HCT: 31.3 % — ABNORMAL LOW (ref 36.0–46.0)
HCT: 35 % — ABNORMAL LOW (ref 36.0–46.0)
Hemoglobin: 10.2 g/dL — ABNORMAL LOW (ref 12.0–15.0)
Hemoglobin: 11.5 g/dL — ABNORMAL LOW (ref 12.0–15.0)
MCH: 30.4 pg (ref 26.0–34.0)
MCH: 29 pg (ref 26.0–34.0)
MCHC: 32.6 g/dL (ref 30.0–36.0)
MCHC: 32.9 g/dL (ref 30.0–36.0)
MCV: 93.4 fL (ref 80.0–100.0)
MCV: 88.4 fL (ref 80.0–100.0)
Platelets: 254 10*3/uL (ref 150–400)
Platelets: 224 10*3/uL (ref 150–400)
RBC: 3.35 MIL/uL — ABNORMAL LOW (ref 3.87–5.11)
RBC: 3.96 MIL/uL (ref 3.87–5.11)
RDW: 13 % (ref 11.5–15.5)
RDW: 18.8 % — ABNORMAL HIGH (ref 11.5–15.5)
WBC: 10 10*3/uL (ref 4.0–10.5)
WBC: 8.4 10*3/uL (ref 4.0–10.5)
nRBC: 0 % (ref 0.0–0.2)
nRBC: 0 % (ref 0.0–0.2)

## 2023-05-10 LAB — POCT I-STAT, CHEM 8
BUN: 29 mg/dL — ABNORMAL HIGH (ref 8–23)
Calcium, Ion: 1.13 mmol/L — ABNORMAL LOW (ref 1.15–1.40)
Chloride: 104 mmol/L (ref 98–111)
Creatinine, Ser: 1.1 mg/dL — ABNORMAL HIGH (ref 0.44–1.00)
Glucose, Bld: 76 mg/dL (ref 70–99)
HCT: 42 % (ref 36.0–46.0)
Hemoglobin: 14.3 g/dL (ref 12.0–15.0)
Potassium: 4 mmol/L (ref 3.5–5.1)
Sodium: 140 mmol/L (ref 135–145)
TCO2: 27 mmol/L (ref 22–32)

## 2023-05-10 LAB — PREPARE RBC (CROSSMATCH)

## 2023-05-10 LAB — TROPONIN I (HIGH SENSITIVITY): Troponin I (High Sensitivity): 8 ng/L (ref <18)

## 2023-05-10 SURGERY — VISCERAL ANGIOGRAPHY
Anesthesia: LOCAL

## 2023-05-10 MED ORDER — NOREPINEPHRINE 4 MG/250ML-% IV SOLN
INTRAVENOUS | Status: AC
  Filled 2023-05-10: qty 250

## 2023-05-10 MED ORDER — LABETALOL HCL 5 MG/ML IV SOLN: 10.0000 mg | INTRAVENOUS | Status: DC | PRN

## 2023-05-10 MED ORDER — LIDOCAINE HCL (PF) 1 % IJ SOLN
INTRAMUSCULAR | Status: AC
  Filled 2023-05-10: qty 30

## 2023-05-10 MED ORDER — ASPIRIN 81 MG PO CHEW
CHEWABLE_TABLET | ORAL | Status: AC
  Filled 2023-05-10: qty 1

## 2023-05-10 MED ORDER — SODIUM CHLORIDE 0.9 % IV SOLN: INTRAVENOUS | Status: DC

## 2023-05-10 MED ORDER — HEPARIN SODIUM (PORCINE) 1000 UNIT/ML IJ SOLN
INTRAMUSCULAR | Status: AC
  Filled 2023-05-10: qty 10

## 2023-05-10 MED ORDER — ALUM & MAG HYDROXIDE-SIMETH 200-200-20 MG/5ML PO SUSP: 15.0000 mL | ORAL | Status: DC | PRN

## 2023-05-10 MED ORDER — OXYCODONE HCL 5 MG PO TABS
5.0000 mg | ORAL_TABLET | ORAL | Status: DC | PRN
  Administered 2023-05-11: 5 mg via ORAL
  Filled 2023-05-10: qty 1

## 2023-05-10 MED ORDER — SODIUM CHLORIDE 0.9% FLUSH
3.0000 mL | Freq: Two times a day (BID) | INTRAVENOUS | Status: DC
Start: 2023-05-11 — End: 2023-05-12
  Administered 2023-05-11 – 2023-05-12 (×3): 3 mL via INTRAVENOUS

## 2023-05-10 MED ORDER — SODIUM CHLORIDE 0.9% IV SOLUTION: Freq: Once | INTRAVENOUS | Status: DC

## 2023-05-10 MED ORDER — SODIUM CHLORIDE 0.9 % WEIGHT BASED INFUSION: 1.0000 mL/kg/h | INTRAVENOUS | Status: AC

## 2023-05-10 MED ORDER — METOPROLOL TARTRATE 5 MG/5ML IV SOLN: 2.0000 mg | INTRAVENOUS | Status: DC | PRN

## 2023-05-10 MED ORDER — ASPIRIN 81 MG PO TBEC
81.0000 mg | DELAYED_RELEASE_TABLET | Freq: Every day | ORAL | Status: DC
  Administered 2023-05-10 – 2023-05-12 (×3): 81 mg via ORAL
  Filled 2023-05-10 (×3): qty 1

## 2023-05-10 MED ORDER — CLOPIDOGREL BISULFATE 75 MG PO TABS
75.0000 mg | ORAL_TABLET | Freq: Every day | ORAL | Status: DC
  Administered 2023-05-11 – 2023-05-12 (×2): 75 mg via ORAL
  Filled 2023-05-10 (×2): qty 1

## 2023-05-10 MED ORDER — HYDRALAZINE HCL 20 MG/ML IJ SOLN: 5.0000 mg | INTRAMUSCULAR | Status: DC | PRN

## 2023-05-10 MED ORDER — FENTANYL CITRATE (PF) 100 MCG/2ML IJ SOLN
INTRAMUSCULAR | Status: AC
  Filled 2023-05-10: qty 2

## 2023-05-10 MED ORDER — FENTANYL CITRATE (PF) 100 MCG/2ML IJ SOLN
INTRAMUSCULAR | Status: DC | PRN
  Administered 2023-05-10 (×4): 50 ug via INTRAVENOUS

## 2023-05-10 MED ORDER — SODIUM CHLORIDE 0.9% FLUSH
3.0000 mL | INTRAVENOUS | Status: DC | PRN
  Administered 2023-05-12 (×2): 3 mL via INTRAVENOUS

## 2023-05-10 MED ORDER — SODIUM CHLORIDE 0.9 % IV SOLN: INTRAVENOUS | Status: AC

## 2023-05-10 MED ORDER — LIDOCAINE-EPINEPHRINE 1 %-1:100000 IJ SOLN
INTRAMUSCULAR | Status: AC
Start: 2023-05-10 — End: 2023-05-11
  Filled 2023-05-10: qty 1

## 2023-05-10 MED ORDER — NOREPINEPHRINE BITARTRATE 1 MG/ML IV SOLN
INTRAVENOUS | Status: AC | PRN
  Administered 2023-05-10: 10 ug/kg/min via INTRAVENOUS

## 2023-05-10 MED ORDER — IODIXANOL 320 MG/ML IV SOLN
INTRAVENOUS | Status: DC | PRN
  Administered 2023-05-10: 150 mL

## 2023-05-10 MED ORDER — LIDOCAINE HCL (PF) 1 % IJ SOLN
INTRAMUSCULAR | Status: DC | PRN
  Administered 2023-05-10: 15 mL

## 2023-05-10 MED ORDER — HEPARIN (PORCINE) IN NACL 1000-0.9 UT/500ML-% IV SOLN
INTRAVENOUS | Status: DC | PRN
  Administered 2023-05-10: 500 mL

## 2023-05-10 MED ORDER — ATROPINE SULFATE 1 MG/10ML IJ SOSY
PREFILLED_SYRINGE | INTRAMUSCULAR | Status: AC
  Filled 2023-05-10: qty 10

## 2023-05-10 MED ORDER — PHENOL 1.4 % MT LIQD: 1.0000 | OROMUCOSAL | Status: DC | PRN

## 2023-05-10 MED ORDER — BISOPROLOL FUMARATE 5 MG PO TABS
2.5000 mg | ORAL_TABLET | Freq: Every day | ORAL | Status: DC
  Filled 2023-05-10: qty 0.5

## 2023-05-10 MED ORDER — HEPARIN SODIUM (PORCINE) 1000 UNIT/ML IJ SOLN
INTRAMUSCULAR | Status: DC | PRN
  Administered 2023-05-10: 2000 [IU] via INTRAVENOUS
  Administered 2023-05-10: 6000 [IU] via INTRAVENOUS
  Administered 2023-05-10: 2000 [IU] via INTRAVENOUS

## 2023-05-10 MED ORDER — HYDROMORPHONE HCL 1 MG/ML IJ SOLN
0.5000 mg | INTRAMUSCULAR | Status: DC | PRN
  Administered 2023-05-10 – 2023-05-11 (×2): 0.5 mg via INTRAVENOUS
  Filled 2023-05-10 (×2): qty 0.5

## 2023-05-10 MED ORDER — CALCIUM GLUCONATE-NACL 1-0.675 GM/50ML-% IV SOLN
1.0000 g | Freq: Once | INTRAVENOUS | Status: AC
  Administered 2023-05-10: 1000 mg via INTRAVENOUS
  Filled 2023-05-10: qty 50

## 2023-05-10 MED ORDER — SODIUM CHLORIDE 0.9 % IV SOLN: 250.0000 mL | INTRAVENOUS | Status: AC | PRN

## 2023-05-10 MED ORDER — ACETAMINOPHEN 325 MG PO TABS
650.0000 mg | ORAL_TABLET | ORAL | Status: DC | PRN
  Administered 2023-05-11: 650 mg via ORAL
  Filled 2023-05-10: qty 2

## 2023-05-10 MED ORDER — GUAIFENESIN-DM 100-10 MG/5ML PO SYRP: 15.0000 mL | ORAL_SOLUTION | ORAL | Status: DC | PRN

## 2023-05-10 MED ORDER — PANTOPRAZOLE SODIUM 40 MG PO TBEC
40.0000 mg | DELAYED_RELEASE_TABLET | Freq: Every day | ORAL | Status: DC
  Administered 2023-05-10 – 2023-05-12 (×3): 40 mg via ORAL
  Filled 2023-05-10 (×3): qty 1

## 2023-05-10 MED ORDER — ASPIRIN 81 MG PO CHEW
CHEWABLE_TABLET | ORAL | Status: DC | PRN
  Administered 2023-05-10: 81 mg via ORAL

## 2023-05-10 MED ORDER — MIDAZOLAM HCL 2 MG/2ML IJ SOLN
INTRAMUSCULAR | Status: AC
  Filled 2023-05-10: qty 2

## 2023-05-10 MED ORDER — ONDANSETRON HCL 4 MG/2ML IJ SOLN: 4.0000 mg | Freq: Four times a day (QID) | INTRAMUSCULAR | Status: DC | PRN

## 2023-05-10 MED ORDER — MIDAZOLAM HCL 2 MG/2ML IJ SOLN
INTRAMUSCULAR | Status: DC | PRN
  Administered 2023-05-10 (×4): 1 mg via INTRAVENOUS

## 2023-05-10 MED ORDER — ROSUVASTATIN CALCIUM 5 MG PO TABS: 5.0000 mg | ORAL_TABLET | ORAL | Status: DC

## 2023-05-10 MED ORDER — CLOPIDOGREL BISULFATE 75 MG PO TABS: 75.0000 mg | ORAL_TABLET | Freq: Every day | ORAL | Status: DC

## 2023-05-10 SURGICAL SUPPLY — 38 items
BALLN COYOTE ES OTW 2X20X142 (BALLOONS) ×1 IMPLANT
BALLN STERLING OTW 3X20X150 (BALLOONS) ×1 IMPLANT
BALLOON COYOTE ES OTW 2X20X142 (BALLOONS) IMPLANT
BALLOON STERLING OTW 3X20X150 (BALLOONS) IMPLANT
CATH BEACON 5 .035 65 C1 TIP (CATHETERS) IMPLANT
CATH CROSS OVER TEMPO 5F (CATHETERS) IMPLANT
CATH NAVICROSS ST .035X90CM (MICROCATHETER) IMPLANT
CATH NAVICROSS ST 65CM (CATHETERS) IMPLANT
CATH OMNI FLUSH 5F 65CM (CATHETERS) IMPLANT
CATH QUICKCROSS SUPP .018X90CM (MICROCATHETER) IMPLANT
CATH QUICKCROSS SUPP .035X90CM (MICROCATHETER) IMPLANT
CATHETER NAVICROSS ST 65CM (CATHETERS) ×1 IMPLANT
CLOSURE PERCLOSE PROSTYLE (VASCULAR PRODUCTS) IMPLANT
GLIDEWIRE ADV .035X260CM (WIRE) IMPLANT
GUIDE CATH VISTA IMA 6F (CATHETERS) IMPLANT
GUIDE CATH VISTA JR4 6F (CATHETERS) IMPLANT
GUIDEWIRE ANGLED .035X260CM (WIRE) IMPLANT
KIT ENCORE 26 ADVANTAGE (KITS) IMPLANT
KIT MICROPUNCTURE NIT STIFF (SHEATH) IMPLANT
KIT SINGLE USE MANIFOLD (KITS) IMPLANT
SET ATX-X65L (MISCELLANEOUS) IMPLANT
SHEATH APTUS 6.5FR 45CM (SHEATH) IMPLANT
SHEATH APTUS 7.0FR 180D 55 (SHEATH) IMPLANT
SHEATH FLEXOR ANSEL 1 7F 45CM (SHEATH) IMPLANT
SHEATH INTROD PASSPORT 45 6.5F (SHEATH) IMPLANT
SHEATH PINNACLE 5F 10CM (SHEATH) IMPLANT
SHEATH PINNACLE 6F 10CM (SHEATH) IMPLANT
SHEATH PINNACLE 7F 10CM (SHEATH) IMPLANT
SHEATH PINNACLE 8F 10CM (SHEATH) IMPLANT
SHEATH PROBE COVER 6X72 (BAG) IMPLANT
STENT HERCULINK RX 6.0X12X135 (Permanent Stent) IMPLANT
TRAY PV CATH (CUSTOM PROCEDURE TRAY) ×2 IMPLANT
WIRE AMPLATZ SS-J .035X180CM (WIRE) IMPLANT
WIRE BENTSON .035X145CM (WIRE) IMPLANT
WIRE G V18X300CM (WIRE) IMPLANT
WIRE ROSEN-J .035X260CM (WIRE) IMPLANT
WIRE SHEPHERD 12G .014 (WIRE) IMPLANT
WIRE SPARTACORE .014X300CM (WIRE) IMPLANT

## 2023-05-10 NOTE — Interval H&P Note (Signed)
 History and Physical Interval Note:  05/10/2023 5:59 PM  Teresa [name]  has presented today for surgery, with the diagnosis of mesenteric ischemia.  The various methods of treatment have been discussed with the patient and family. After consideration of risks, benefits and other options for treatment, the patient has consented to  Procedure(s) with comments: VISCERAL ANGIOGRAPHY (N/A) VISCERAL ARTERY INTERVENTION (N/A) - SMA as a surgical intervention.  The patient's history has been reviewed, patient examined, no change in status, stable for surgery.  I have reviewed the patient's chart and labs.  Questions were answered to the patient's satisfaction.     Leonie Douglas

## 2023-05-10 NOTE — Progress Notes (Signed)
 Received patient from OR with first of two unit emergency blood infusion running as well as levophed at . Levophed has been titrated down to . Lab sent before blood transfusion showed a hemoglobin of 10.2, and per conversation with Dr Kathe Becton, it is ok to hold the second unit of blood.

## 2023-05-10 NOTE — Progress Notes (Signed)
 eLink Physician-Brief Progress Note Patient Name: EASTON SIVERTSON DOB: 05/01/1950 MRN: [id number]   Date of Service  05/10/2023  HPI/Events of Note  72/F with chronic mesenteric ischemia, underwent aortogram, angiogram of celiac artery and SMA, angioplasy and stenting of the SMA. Pt tolerated well. Post op, developed bradycardia and hypotension as groin was being closed. Pt admitted to ICU for close monitoring and further management   eICU Interventions  - Admitted to ICU.   - Started on levophed infusion. Titrate to target MAP >65 - Maintain on continuous cardiac monitoring.  - BMP resulted. Calcium low - will replete - Will follow serial BMP, address abnormalities as warranted.  - CBC pending.  - Pt planned for pRBC transfusion.  - Will monitor for bleed/hematoma formation on access site.  - Continue supportive measures.         Collin Rengel M DELA CRUZ 05/10/2023, 7:35 PM

## 2023-05-10 NOTE — Progress Notes (Signed)
 Patient developed severe bradycardia (~40s) and hypotension (~60s SBP) immediately after closing groin. Possible we had more losses than anticipated with difficult access and constant ooze.  Gave IVF; started levophed with good effect. Started PRBCs anticipating blood loss anemia. Clear mentation throughout. CBC / BMP sent to lab. Will transfer to ICU. Will ask PCCM for assistance.  Rande Brunt. Lenell Antu, MD [facility] Phone Number: [phone] 05/10/2023 7:18 PM

## 2023-05-10 NOTE — Op Note (Addendum)
 DATE OF SERVICE: 05/10/2023  PATIENT:  Teresa [name]  73 y.o. female  PRE-OPERATIVE DIAGNOSIS:  chronic mesenteric ischemia  POST-OPERATIVE DIAGNOSIS:  Same  PROCEDURE:   1) ultrasound guided right common femoral artery access 2) aortogram (150cc total contrast) 3) selective angiogram of celiac artery 4) selective angiogram of superior mesenteric artery 5) angioplasty and stenting of superior mesenteric artery (6x69mm Herculink) 6) conscious sedation (132 minutes)  SURGEON:  Surgeons and Role:    * Leonie Douglas, MD - Primary  ASSISTANT: none  ANESTHESIA:   local and IV sedation  EBL:  BLOOD ADMINISTERED:none  DRAINS: none   LOCAL MEDICATIONS USED:  LIDOCAINE   SPECIMEN:  minimal  COUNTS: confirmed correct.  TOURNIQUET:  none  PATIENT DISPOSITION:  PACU - hemodynamically stable.   Delay start of Pharmacological VTE agent (>24hrs) due to surgical blood loss or risk of bleeding: no  INDICATION FOR PROCEDURE: Teresa [name] is a 73 y.o. female with chronic mesenteric ischemia. After careful discussion of risks, benefits, and alternatives the patient was offered aortogram and possible mesenteric intervention. The patient understood and wished to proceed.  OPERATIVE FINDINGS:  Aortogram showed fairly normal celiac anatomy; superior mesenteric artery had severe stenosis (greater than 90%) with exophytic plaque that was heavily calcified.  This is very difficult to access, but ultimately was able to cross this through a series of wire and catheter exchanges; renal arteries were patent bilaterally; no proximal anastomotic stenosis of the aortobifemoral bypass which appeared similar to prior angiogram.  The remainder of the aortobifemoral bypass was unremarkable.  After intervention, the superior mesenteric artery was widely patent.  DESCRIPTION OF PROCEDURE: After identification of the patient in the pre-operative holding area, the patient was transferred to the  operating room. The patient was positioned supine on the operating room table. Anesthesia was induced. The groins were prepped and draped in standard fashion. A surgical pause was performed confirming correct patient, procedure, and operative location.  Duplex ultrasound was used to access the right limb of the aortobifemoral bypass graft just proximal to its anastomosis.  Micro sheath was introduced using Seldinger technique into the limb of the aortobifemoral bypass.  A Bentson wire was easily navigated into the descending thoracic aorta.  Over the wire a 5 [place] sheath was introduced.  An Omni Flush catheter was navigated into the perivisceral aorta.  Aortograms were performed in AP and lateral position.  We defined the superior mesenteric anatomy.  The patient was systemically heparinized.  The patient was redosed throughout the case to ensure adequate anticoagulation dosing.  Multiple sheath, catheter, wire combinations were attempted without success.  Ultimately I was able to use a steerable sheath and [place] core wire to access the superior mesenteric artery.  I then performed serial balloon dilation and gradually upsized wire until I was able to build an 035 platform into the superior mesenteric artery.  I then tried to deliver the a 7 [place] by 45 cm Ansell sheath into the orifice of the superior mesenteric artery, but this kicked out.  I was easily able to reengage the superior mesenteric artery because the prior angioplasty efforts.  I elected to abandon plans for recovered stenting and instead opted for a low-profile bare-metal balloon expandable stent.  A 6 mm x 12 mm Herculink stent was selected and delivered easily across the orifice of the superior mesenteric artery.  This was inflated without difficulty with good apposition and small overlap into the lumen of the aorta.  Completion angiogram  confirmed wide patency of the superior mesenteric artery distal to the stent, and the stent, and from  the aorta into the stent.  Satisfied end of the case here.  The sheath was removed and the arteriotomy closed with a Perclose suture.  Good hemostasis was achieved.  Conscious sedation was administered with the use of IV fentanyl and midazolam under continuous physician and nurse monitoring.  Heart rate, blood pressure, and oxygen saturation were continuously monitored.  Total sedation time was 132 minutes  Upon completion of the case instrument and sharps counts were confirmed correct. The patient was transferred to the PACU in good condition. I was present for all portions of the procedure.  FOLLOW UP PLAN: Admitted for observation overnight.  Check CBC, BMP.  Trial regular diet.  Give aspirin, Plavix.  Okay for discharge tomorrow as long as she is feeling well.  Assuming a normal postoperative course, I will see the patient in 4 weeks with mesenteric duplex.   Rande Brunt. Lenell Antu, MD [facility] Phone Number: [phone] 05/10/2023 6:00 PM

## 2023-05-10 NOTE — Consult Note (Signed)
 NAME:  Teresa [name], MRN:  [id number], DOB:  1950/09/18, LOS: 0 ADMISSION DATE:  05/10/2023, CONSULTATION DATE:  05/10/2023 REFERRING MD:  Heath Lark, MD CHIEF COMPLAINT:  Hypotension   History of Present Illness:  73 y/o female with  history of COPD, lung CA s/p RUL resection 2014, CAD (prior stenting of the LM, LAD and circumflex in the setting of dissection), AS s/p TAVR 6/20 and systolic HF with recovered EF 40-98% 9/24, PAD, ABF bypass grafting and bilateral CFA endarterectomies 09/20/2021.  Patient presented for mesenteric ischemia and underwent: "PROCEDURE:   1) ultrasound guided right common femoral artery access 2) aortogram (150cc total contrast) 3) selective angiogram of celiac artery 4) selective angiogram of superior mesenteric artery 5) angioplasty and stenting of superior mesenteric artery (6x94mm Herculink) 6) conscious sedation (132 minutes)"   Patient developed Hypotension (SBP in 60's) and bradycardia (HR-40's) upon closure of groin.  Patient admitted to @H .  She was ordered for 2 units PRBC as anticipated blood losses maybe more than anticipated with oozing. Pertinent  Medical History  COPD Lung CA s/p RUL resection 2014 CAD (prior stenting of the LM, LAD and circumflex in the setting of dissection),  AS s/p TAVR 6/20  PAD ABF bypass grafting and bilateral CFA endarterectomies 09/20/2021  Significant Hospital Events: Including procedures, antibiotic start and stop dates in addition to other pertinent events   3/7: Patient went to OR for angioplasty and stenting of superior mesenteric artery  -Patient developed bradycardia and hypotension  Interim History / Subjective:  N/a  Objective   Blood pressure (!) 95/52, pulse 61, temperature 98.3 F (36.8 C), temperature source Oral, resp. rate (!) 28, height 4\' 11"  (1.499 m), weight 50.8 kg, last menstrual period 10/04/1999, SpO2 100%.        Intake/Output Summary (Last 24 hours) at 05/10/2023 2045 Last data filed  at 05/10/2023 1825 Gross per 24 hour  Intake 315 ml  Output --  Net 315 ml   Filed Weights   05/10/23 1357  Weight: 50.8 kg    Examination: General: Alert NAD HENT: PERRLA no Icterus, EOMI Lungs: CTA b/l no wheezes no rales Cardiovascular: Reg s1s2 no gallops or rubs SEM Abdomen: soft nt nd bs sluggish Extremities: groin site clean dry bandage Neuro: AAOx3 CN II to XII grossly intact   Resolved Hospital Problem list   N/a  Assessment & Plan:  Patient with h/o PAD who presented with Mesenteric Ischemia and became hypotensive with bradycardia upon closure of groin.  Patient was -placed on levophed 53mcg/min.  She was started on her first unit PRBC of two units.  Her BP has responded and plan will be to wean off the Levophed over the next few hours with the blood transfusion.  Check H/H (serial) and also get troponins and EKG. ICU  monitoring  Case d/w Dr. Lenell Antu Best Practice (right click and "Reselect all SmartList Selections" daily)   Diet/type: NPO DVT prophylaxis other Pressure ulcer(s): N/A GI prophylaxis: N/A Lines: N/A Foley:  N/A Code Status:  full code   Labs   CBC: Recent Labs  Lab 05/10/23 1359 05/10/23 1830  WBC  --  10.0  HGB 14.3 10.2*  HCT 42.0 31.3*  MCV  --  93.4  PLT  --  254    Basic Metabolic Panel: Recent Labs  Lab 05/10/23 1359 05/10/23 1830  NA 140 136  K 4.0 3.6  CL 104 104  CO2  --  21*  GLUCOSE 76 95  BUN 29*  24*  CREATININE 1.10* 0.98  CALCIUM  --  8.1*   GFR: Estimated Creatinine Clearance: 36.4 mL/min (by C-G formula based on SCr of 0.98 mg/dL). Recent Labs  Lab 05/10/23 1830  WBC 10.0    Liver Function Tests: No results for input(s): "AST", "ALT", "ALKPHOS", "BILITOT", "PROT", "ALBUMIN" in the last 168 hours. No results for input(s): "LIPASE", "AMYLASE" in the last 168 hours. No results for input(s): "AMMONIA" in the last 168 hours.  ABG    Component Value Date/Time   PHART 7.376 09/20/2021 1426   PCO2ART  38.1 09/20/2021 1426   PO2ART 210 (H) 09/20/2021 1426   HCO3 22.6 09/20/2021 1426   TCO2 27 05/10/2023 1359   ACIDBASEDEF 3.0 (H) 09/20/2021 1426   O2SAT 100 09/20/2021 1426     Coagulation Profile: No results for input(s): "INR", "PROTIME" in the last 168 hours.  Cardiac Enzymes: No results for input(s): "CKTOTAL", "CKMB", "CKMBINDEX", "TROPONINI" in the last 168 hours.  HbA1C: Hgb A1c MFr Bld  Date/Time Value Ref Range Status  08/30/2022 04:11 PM 6.1 (H) 4.8 - 5.6 % Final    Comment:    (NOTE)         Prediabetes: 5.7 - 6.4         Diabetes: >6.4         Glycemic control for adults with diabetes: <7.0   09/20/2021 04:38 PM 5.5 4.8 - 5.6 % Final    Comment:    (NOTE) Pre diabetes:          5.7%-6.4%  Diabetes:              >6.4%  Glycemic control for   <7.0% adults with diabetes     CBG: No results for input(s): "GLUCAP" in the last 168 hours.  Review of Systems:   Denies Lightheadedness, chest pains, dizziness, palpitations, SOB, no headache 12 point ROS conducted  Past Medical History:  She,  has a past medical history of AAA (abdominal aortic aneurysm) (HCC), Acute on chronic combined systolic and diastolic heart failure (HCC) (62/95/2841), Arthritis, CAD (coronary artery disease), native coronary artery, Cancer (HCC), Cataract, Chronic combined systolic and diastolic CHF (congestive heart failure) (HCC), COPD (chronic obstructive pulmonary disease) (HCC), Coronary artery disease, GERD (gastroesophageal reflux disease), Hypercholesterolemia, Ischemic cardiomyopathy, Mass of lung, Mitral regurgitation, Peripheral vascular disease, unspecified (HCC), Pneumonia, S/P TAVR (transcatheter aortic valve replacement), Severe aortic stenosis, Severe mitral regurgitation, Tachycardia, unspecified, and Tobacco abuse.   Surgical History:   Past Surgical History:  Procedure Laterality Date   ABDOMINAL AORTOGRAM W/LOWER EXTREMITY N/A 08/03/2021   Procedure: ABDOMINAL AORTOGRAM  W/LOWER EXTREMITY;  Surgeon: Cephus Shelling, MD;  Location: [facility] INVASIVE CV LAB;  Service: Cardiovascular;  Laterality: N/A;   ABDOMINAL AORTOGRAM W/LOWER EXTREMITY N/A 04/13/2022   Procedure: ABDOMINAL AORTOGRAM W/LOWER EXTREMITY;  Surgeon: Leonie Douglas, MD;  Location: [facility] INVASIVE CV LAB;  Service: Cardiovascular;  Laterality: N/A;   AORTA - BILATERAL FEMORAL ARTERY BYPASS GRAFT Bilateral 09/20/2021   Procedure: AORTA BIFEMORAL BYPASS GRAFT WITH EPIDURAL CATHETER and  aortic endartarectomey and bilateral femoral endartarectomies.;  Surgeon: Leonie Douglas, MD;  Location: [facility] OR;  Service: Vascular;  Laterality: Bilateral;   CARDIAC CATHETERIZATION  08/2018   CORONARY ANGIOPLASTY WITH STENT PLACEMENT  10/2012; 12/08/2012   "7 + 3" (02/27/2013)   ESOPHAGOGASTRODUODENOSCOPY (EGD) WITH PROPOFOL N/A 01/11/2015   Procedure: ESOPHAGOGASTRODUODENOSCOPY (EGD) WITH PROPOFOL;  Surgeon: Charolett Bumpers, MD;  Location: [facility] ENDOSCOPY;  Service: Endoscopy;  Laterality: N/A;   FLEXIBLE BRONCHOSCOPY  N/A 03/02/2013   Procedure: FLEXIBLE BRONCHOSCOPY;  Surgeon: Alleen Borne, MD;  Location: [facility] OR;  Service: Thoracic;  Laterality: N/A;   FLEXIBLE SIGMOIDOSCOPY N/A 01/11/2015   Procedure: FLEXIBLE SIGMOIDOSCOPY;  Surgeon: Charolett Bumpers, MD;  Location: [facility] ENDOSCOPY;  Service: Endoscopy;  Laterality: N/A;  unable to complete colon-prep issues   GANGLION CYST EXCISION Left 1975   "wrist"   INCISIONAL HERNIA REPAIR N/A 08/30/2022   Procedure: INCISIONAL HERNIA REPAIR WITH MESH;  Surgeon: Kinsinger, [name], MD;  Location: [facility] ORS;  Service: General;  Laterality: N/A;   LEFT HEART CATH AND CORONARY ANGIOGRAPHY N/A 06/29/2016   Procedure: Left Heart Cath and Coronary Angiography;  Surgeon: Corky Crafts, MD;  Location: [facility] INVASIVE CV LAB;  Service: Cardiovascular;  Laterality: N/A;   LEFT HEART CATH AND CORONARY ANGIOGRAPHY N/A 08/19/2018   Procedure: LEFT HEART CATH AND CORONARY ANGIOGRAPHY;  Surgeon:  Kathleene Hazel, MD;  Location: [facility] INVASIVE CV LAB;  Service: Cardiovascular;  Laterality: N/A;   LEFT HEART CATHETERIZATION WITH CORONARY ANGIOGRAM N/A 12/08/2012   Procedure: LEFT HEART CATHETERIZATION WITH CORONARY ANGIOGRAM;  Surgeon: Corky Crafts, MD;  Location: [facility] CATH LAB;  Service: Cardiovascular;  Laterality: N/A;   MOUTH SURGERY  2010?   "for bone loss" (06/24/2012)   PERCUTANEOUS CORONARY STENT INTERVENTION (PCI-S) N/A 10/09/2012   Procedure: PERCUTANEOUS CORONARY STENT INTERVENTION (PCI-S);  Surgeon: Corky Crafts, MD;  Location: [facility] CATH LAB;  Service: Cardiovascular;  Laterality: N/A;   RIGHT HEART CATH N/A 08/19/2018   Procedure: RIGHT HEART CATH;  Surgeon: Kathleene Hazel, MD;  Location: [facility] INVASIVE CV LAB;  Service: Cardiovascular;  Laterality: N/A;   TEE WITHOUT CARDIOVERSION N/A 08/22/2018   Procedure: TRANSESOPHAGEAL ECHOCARDIOGRAM (TEE);  Surgeon: Chrystie Nose, MD;  Location: [facility] ENDOSCOPY;  Service: Cardiovascular;  Laterality: N/A;   TEE WITHOUT CARDIOVERSION N/A 08/26/2018   Procedure: TRANSESOPHAGEAL ECHOCARDIOGRAM (TEE);  Surgeon: Kathleene Hazel, MD;  Location[facility] OR;  Service: Open Heart Surgery;  Laterality: N/A;   THORACOTOMY/LOBECTOMY Right 03/02/2013   Procedure: Right Video Assisted Thoracoscopy/Thoracotomy with upper Lobectomy;  Surgeon: Alleen Borne, MD;  Location: [facility] OR;  Service: Thoracic;  Laterality: Right;  Right Lung Upper  Lobectomy    TONSILLECTOMY  1960   VIDEO BRONCHOSCOPY  09/12/2011   Procedure: VIDEO BRONCHOSCOPY WITH FLUORO;  Surgeon: Nyoka Cowden, MD;  Location: [facility] ENDOSCOPY;  Service: Cardiopulmonary;  Laterality: Bilateral;     Social History:   reports that she quit smoking about 10 years ago. Her smoking use included cigarettes. She started smoking about 50 years ago. She has a 20 pack-year smoking history. She has never used smokeless tobacco. She reports that she does not drink alcohol and does not use  drugs.   Family History:  Her family history includes Alcohol abuse in her father; Lupus in her mother; Migraines in her sister; Other in her sister; Pulmonary fibrosis in her brother. There is no history of Heart attack.   Allergies Allergies  Allergen Reactions   Azithromycin Shortness Of Breath    Other Reaction(s): burning rash/body   Cefaclor Rash and Shortness Of Breath    Other Reaction(s): breathing difficulty   Penicillins Other (See Comments)    Unknown from childhood Did it involve swelling of the face/tongue/throat, SOB, or low BP? No Did it involve sudden or severe rash/hives, skin peeling, or any reaction on the inside of your mouth or nose? No Did you need to seek medical attention at a  hospital or doctor's office? Yes When did it last happen? Infancy  If all above answers are "NO", may proceed with cephalosporin use.    Septra [Sulfamethoxazole-Trimethoprim] Shortness Of Breath and Rash   Doxycycline Swelling and Other (See Comments)    Redness on the face   Lipitor [Atorvastatin] Other (See Comments)    Severe muscle aches   Morphine And Codeine Nausea And Vomiting and Other (See Comments)    Terrible headache   Zetia [Ezetimibe] Other (See Comments)    Severe stomach pain   Zocor [Simvastatin] Other (See Comments)    Severe muscle aches   Adhesive [Tape] Other (See Comments)    Redness and swelling - use paper tape   Vicodin [Hydrocodone-Acetaminophen] Nausea And Vomiting     Home Medications  Prior to Admission medications   Medication Sig Start Date End Date Taking? Authorizing Provider  acetaminophen (TYLENOL) 500 MG tablet Take 1,000 mg by mouth every 6 (six) hours as needed for moderate pain.   Yes [provider]  albuterol (VENTOLIN HFA) 108 (90 Base) MCG/ACT inhaler Inhale 2 puffs into the lungs every 6 (six) hours as needed for wheezing or shortness of breath.   Yes [provider]  alendronate (FOSAMAX) 70 MG tablet Take 70 mg by  mouth once a week. Take with a full glass of water on an empty stomach.   Yes [provider]  Alirocumab (PRALUENT) 150 MG/ML SOAJ Inject 1 mL (150 mg total) into the skin every 14 (fourteen) days. 04/08/23  Yes Bensimhon, Bevelyn Buckles, MD  bisoprolol (ZEBETA) 5 MG tablet Take 0.5 tablets (2.5 mg total) by mouth daily. 05/07/23  Yes Bensimhon, Bevelyn Buckles, MD  Calcium Carb-Cholecalciferol (CALCIUM 600 + D PO) Take 1 tablet by mouth daily.   Yes [provider]  cholecalciferol (VITAMIN D3) 25 MCG (1000 UNIT) tablet Take 1,000 Units by mouth daily.   Yes [provider]  clopidogrel (PLAVIX) 75 MG tablet Take 1 tablet (75 mg total) by mouth daily. 04/27/22  Yes Bensimhon, Bevelyn Buckles, MD  empagliflozin (JARDIANCE) 10 MG TABS tablet Take 1 tablet (10 mg total) by mouth daily. 04/27/22  Yes Bensimhon, Bevelyn Buckles, MD  famotidine (ZANTAC 360) 10 MG tablet Take 10 mg by mouth daily as needed for heartburn or indigestion.   Yes [provider]  Fluticasone-Umeclidin-Vilant (TRELEGY ELLIPTA) 100-62.5-25 MCG/INH AEPB Inhale 1 puff into the lungs daily. 07/17/19  Yes Nyoka Cowden, MD  furosemide (LASIX) 40 MG tablet TAKE 1 TABLET BY MOUTH AS NEEDED FOR FLUID OR EDMA 04/10/23  Yes Bensimhon, Bevelyn Buckles, MD  lansoprazole (PREVACID) 30 MG capsule Take 30 mg by mouth in the morning.   Yes [provider]  NASAL SALINE NA Place 1 spray into the nose every 4 (four) hours as needed (congestion).   Yes [provider]  nitroGLYCERIN (NITROSTAT) 0.4 MG SL tablet DISSOLVE ONE TABLET UNDER TONGUE AS NEEDED FOR CHEST PAIN EVERY 5 MINUTES 06/07/22  Yes Corky Crafts, MD  rosuvastatin (CRESTOR) 5 MG tablet Take 1 tablet (5 mg total) by mouth 3 (three) times a week. 05/10/23  Yes Bensimhon, Bevelyn Buckles, MD  simethicone (MYLICON) 80 MG chewable tablet Chew 2 tablets (160 mg total) by mouth every 6 (six) hours as needed for flatulence. 08/28/18  Yes Leary Roca, PA-C   Spacer/Aero-Holding Chambers (AEROCHAMBER MV) inhaler Use as instructed 01/09/18  Yes Coral Ceo, NP  spironolactone (ALDACTONE) 25 MG tablet Take 0.5 tablets (12.5 mg total)  by mouth daily. 11/19/22  Yes Corky Crafts, MD  albuterol (PROVENTIL) (2.5 MG/3ML) 0.083% nebulizer solution Take 2.5 mg by nebulization every 6 (six) hours as needed for shortness of breath or wheezing. 06/03/21   [provider]  clindamycin (CLEOCIN) 150 MG capsule Take 600 mg by mouth See admin instructions. Take 600 mg by mouth 1 hour prior to dental appointment    [provider]  Hypromellose 0.2 % SOLN Place 2 drops into both eyes 4 (four) times daily as needed (dry eyes).     [provider]     Critical care time: 35 min    The patient is critically ill with multiple organ system failure and requires high complexity decision making for assessment and support, frequent evaluation and titration of therapies, advanced monitoring, review of radiographic studies and interpretation of complex data.   Critical Care Time devoted to patient care services, exclusive of separately billable procedures, described in this note is 35 minutes.   Rozann Lesches MD [facility] [facility] See Amion for pager  If no response to pager , please call [phone] until 7pm After 7:00 pm call Elink  [phone] 05/10/2023, 8:45 PM

## 2023-05-11 ENCOUNTER — Encounter (HOSPITAL_COMMUNITY): Payer: Self-pay | Admitting: Vascular Surgery

## 2023-05-11 ENCOUNTER — Other Ambulatory Visit: Payer: Self-pay

## 2023-05-11 DIAGNOSIS — J449 Chronic obstructive pulmonary disease, unspecified: Secondary | ICD-10-CM | POA: Diagnosis not present

## 2023-05-11 DIAGNOSIS — Z952 Presence of prosthetic heart valve: Secondary | ICD-10-CM | POA: Diagnosis not present

## 2023-05-11 DIAGNOSIS — Z85118 Personal history of other malignant neoplasm of bronchus and lung: Secondary | ICD-10-CM | POA: Diagnosis not present

## 2023-05-11 DIAGNOSIS — Z87891 Personal history of nicotine dependence: Secondary | ICD-10-CM | POA: Diagnosis not present

## 2023-05-11 DIAGNOSIS — M1990 Unspecified osteoarthritis, unspecified site: Secondary | ICD-10-CM | POA: Diagnosis not present

## 2023-05-11 DIAGNOSIS — Z888 Allergy status to other drugs, medicaments and biological substances status: Secondary | ICD-10-CM | POA: Diagnosis not present

## 2023-05-11 DIAGNOSIS — Z880 Allergy status to penicillin: Secondary | ICD-10-CM | POA: Diagnosis not present

## 2023-05-11 DIAGNOSIS — I255 Ischemic cardiomyopathy: Secondary | ICD-10-CM | POA: Diagnosis not present

## 2023-05-11 DIAGNOSIS — Z955 Presence of coronary angioplasty implant and graft: Secondary | ICD-10-CM | POA: Diagnosis not present

## 2023-05-11 DIAGNOSIS — Z885 Allergy status to narcotic agent status: Secondary | ICD-10-CM | POA: Diagnosis not present

## 2023-05-11 DIAGNOSIS — E1151 Type 2 diabetes mellitus with diabetic peripheral angiopathy without gangrene: Secondary | ICD-10-CM | POA: Diagnosis not present

## 2023-05-11 DIAGNOSIS — K551 Chronic vascular disorders of intestine: Secondary | ICD-10-CM | POA: Diagnosis not present

## 2023-05-11 DIAGNOSIS — I5042 Chronic combined systolic (congestive) and diastolic (congestive) heart failure: Secondary | ICD-10-CM | POA: Diagnosis not present

## 2023-05-11 DIAGNOSIS — Z7984 Long term (current) use of oral hypoglycemic drugs: Secondary | ICD-10-CM | POA: Diagnosis not present

## 2023-05-11 DIAGNOSIS — Z881 Allergy status to other antibiotic agents status: Secondary | ICD-10-CM | POA: Diagnosis not present

## 2023-05-11 DIAGNOSIS — E7800 Pure hypercholesterolemia, unspecified: Secondary | ICD-10-CM | POA: Diagnosis not present

## 2023-05-11 DIAGNOSIS — K219 Gastro-esophageal reflux disease without esophagitis: Secondary | ICD-10-CM | POA: Diagnosis not present

## 2023-05-11 DIAGNOSIS — Z8679 Personal history of other diseases of the circulatory system: Secondary | ICD-10-CM | POA: Diagnosis not present

## 2023-05-11 DIAGNOSIS — I2510 Atherosclerotic heart disease of native coronary artery without angina pectoris: Secondary | ICD-10-CM | POA: Diagnosis not present

## 2023-05-11 DIAGNOSIS — I9581 Postprocedural hypotension: Secondary | ICD-10-CM | POA: Diagnosis not present

## 2023-05-11 DIAGNOSIS — Z7983 Long term (current) use of bisphosphonates: Secondary | ICD-10-CM | POA: Diagnosis not present

## 2023-05-11 DIAGNOSIS — R001 Bradycardia, unspecified: Secondary | ICD-10-CM | POA: Diagnosis not present

## 2023-05-11 DIAGNOSIS — Z882 Allergy status to sulfonamides status: Secondary | ICD-10-CM | POA: Diagnosis not present

## 2023-05-11 DIAGNOSIS — R0989 Other specified symptoms and signs involving the circulatory and respiratory systems: Secondary | ICD-10-CM | POA: Diagnosis not present

## 2023-05-11 LAB — CBC
HCT: 29.9 % — ABNORMAL LOW (ref 36.0–46.0)
HCT: 32.3 % — ABNORMAL LOW (ref 36.0–46.0)
HCT: 35.3 % — ABNORMAL LOW (ref 36.0–46.0)
Hemoglobin: 10 g/dL — ABNORMAL LOW (ref 12.0–15.0)
Hemoglobin: 10.7 g/dL — ABNORMAL LOW (ref 12.0–15.0)
Hemoglobin: 11.5 g/dL — ABNORMAL LOW (ref 12.0–15.0)
MCH: 28.8 pg (ref 26.0–34.0)
MCH: 29.3 pg (ref 26.0–34.0)
MCH: 29.3 pg (ref 26.0–34.0)
MCHC: 32.6 g/dL (ref 30.0–36.0)
MCHC: 33.1 g/dL (ref 30.0–36.0)
MCHC: 33.4 g/dL (ref 30.0–36.0)
MCV: 87.7 fL (ref 80.0–100.0)
MCV: 88.3 fL (ref 80.0–100.0)
MCV: 88.5 fL (ref 80.0–100.0)
Platelets: 220 10*3/uL (ref 150–400)
Platelets: 229 10*3/uL (ref 150–400)
Platelets: 255 10*3/uL (ref 150–400)
RBC: 3.41 MIL/uL — ABNORMAL LOW (ref 3.87–5.11)
RBC: 3.65 MIL/uL — ABNORMAL LOW (ref 3.87–5.11)
RBC: 4 MIL/uL (ref 3.87–5.11)
RDW: 19.6 % — ABNORMAL HIGH (ref 11.5–15.5)
RDW: 20.1 % — ABNORMAL HIGH (ref 11.5–15.5)
RDW: 20.3 % — ABNORMAL HIGH (ref 11.5–15.5)
WBC: 8.6 10*3/uL (ref 4.0–10.5)
WBC: 9.4 10*3/uL (ref 4.0–10.5)
WBC: 9.6 10*3/uL (ref 4.0–10.5)
nRBC: 0 % (ref 0.0–0.2)
nRBC: 0 % (ref 0.0–0.2)
nRBC: 0 % (ref 0.0–0.2)

## 2023-05-11 LAB — BASIC METABOLIC PANEL
Anion gap: 16 — ABNORMAL HIGH (ref 5–15)
Anion gap: 9 (ref 5–15)
BUN: 28 mg/dL — ABNORMAL HIGH (ref 8–23)
BUN: 24 mg/dL — ABNORMAL HIGH (ref 8–23)
CO2: 16 mmol/L — ABNORMAL LOW (ref 22–32)
CO2: 19 mmol/L — ABNORMAL LOW (ref 22–32)
Calcium: 8.7 mg/dL — ABNORMAL LOW (ref 8.9–10.3)
Calcium: 7.8 mg/dL — ABNORMAL LOW (ref 8.9–10.3)
Chloride: 105 mmol/L (ref 98–111)
Chloride: 108 mmol/L (ref 98–111)
Creatinine, Ser: 0.97 mg/dL (ref 0.44–1.00)
Creatinine, Ser: 1.16 mg/dL — ABNORMAL HIGH (ref 0.44–1.00)
GFR, Estimated: >60 mL/min (ref >60)
GFR, Estimated: 51 mL/min — ABNORMAL LOW (ref >60)
Glucose, Bld: 108 mg/dL — ABNORMAL HIGH (ref 70–99)
Glucose, Bld: 102 mg/dL — ABNORMAL HIGH (ref 70–99)
Potassium: 3.8 mmol/L (ref 3.5–5.1)
Potassium: 4.1 mmol/L (ref 3.5–5.1)
Sodium: 137 mmol/L (ref 135–145)
Sodium: 136 mmol/L (ref 135–145)

## 2023-05-11 LAB — LIPID PANEL
Cholesterol: 93 mg/dL (ref 0–200)
HDL: 38 mg/dL — ABNORMAL LOW (ref >40)
LDL Cholesterol: 33 mg/dL (ref 0–99)
Total CHOL/HDL Ratio: 2.4 ratio
Triglycerides: 111 mg/dL (ref <150)
VLDL: 22 mg/dL (ref 0–40)

## 2023-05-11 LAB — TYPE AND SCREEN
ABO/RH(D): A POS
Antibody Screen: NEGATIVE
Unit division: 0
Unit division: 0

## 2023-05-11 LAB — BPAM RBC
Blood Product Expiration Date: 202503132359
Blood Product Expiration Date: 202503282359
ISSUE DATE / TIME: 202503071821
ISSUE DATE / TIME: 202503071821
Unit Type and Rh: 5100
Unit Type and Rh: 5100

## 2023-05-11 LAB — TROPONIN I (HIGH SENSITIVITY): Troponin I (High Sensitivity): 9 ng/L (ref <18)

## 2023-05-11 MED ORDER — NOREPINEPHRINE 4 MG/250ML-% IV SOLN
0.0000 ug/min | INTRAVENOUS | Status: DC
  Administered 2023-05-11: 2 ug/min via INTRAVENOUS
  Filled 2023-05-11: qty 250

## 2023-05-11 MED ORDER — SODIUM CHLORIDE 0.9 % IV BOLUS
500.0000 mL | Freq: Once | INTRAVENOUS | Status: AC
  Administered 2023-05-11: 500 mL via INTRAVENOUS

## 2023-05-11 MED ORDER — NITROGLYCERIN 2 % TD OINT
1.0000 [in_us] | TOPICAL_OINTMENT | Freq: Three times a day (TID) | TRANSDERMAL | Status: DC
  Administered 2023-05-11: 1 [in_us] via TOPICAL
  Filled 2023-05-11: qty 30

## 2023-05-11 MED ORDER — SODIUM CHLORIDE 0.9 % IV SOLN: 250.0000 mL | INTRAVENOUS | Status: DC

## 2023-05-11 MED ORDER — SODIUM CHLORIDE 0.9 % IV BOLUS
1000.0000 mL | Freq: Once | INTRAVENOUS | Status: AC
  Administered 2023-05-11: 1000 mL via INTRAVENOUS

## 2023-05-11 MED ORDER — CHLORHEXIDINE GLUCONATE CLOTH 2 % EX PADS
6.0000 | MEDICATED_PAD | Freq: Every day | CUTANEOUS | Status: DC
  Administered 2023-05-11 – 2023-05-12 (×2): 6 via TOPICAL

## 2023-05-11 MED ORDER — PHENTOLAMINE MESYLATE 5 MG IJ SOLR
5.0000 mg | Freq: Once | INTRAMUSCULAR | Status: AC
  Administered 2023-05-11: 5 mg via SUBCUTANEOUS
  Filled 2023-05-11: qty 5

## 2023-05-11 MED ORDER — NOREPINEPHRINE 4 MG/250ML-% IV SOLN: 2.0000 ug/min | INTRAVENOUS | Status: DC

## 2023-05-11 NOTE — Progress Notes (Signed)
 Extravasation on LUE This nurse was notified by provider that patient was complaining of tenderness on LUE PIV with norepinephrine running at 1730. Levo was stopped. This nurse aspirated 0.3 ml from site and discontinued IV per protocol/order. Extremity was elevated, this nurse administered regitine per protocol and applied a warm compress to the extremity. At this time the patient is hemodynamically stable, norepi has not been restarted at this time. Vital signs and monitoring per protocol.  Christain Sacramento, RN

## 2023-05-11 NOTE — Progress Notes (Signed)
 PHARMACIST LIPID MONITORING   Teresa [name] is a 73 y.o. female admitted on 05/10/2023 with mesenteric ischemia s/p stent .  Pharmacy has been consulted to optimize lipid-lowering therapy with the indication of secondary prevention for clinical ASCVD.  Recent Labs:  Lipid Panel (last 6 months):   Lab Results  Component Value Date   CHOL 93 05/10/2023   TRIG 111 05/10/2023   HDL 38 (L) 05/10/2023   CHOLHDL 2.4 05/10/2023   VLDL 22 05/10/2023   LDLCALC 33 05/10/2023    Hepatic function panel (last 6 months):   No results found for: "AST", "ALT", "ALKPHOS", "BILITOT", "BILIDIR", "IBILI"  SCr (since admission):   Serum creatinine: 1.16 mg/dL (H) 16/10/96 0454 Estimated creatinine clearance: 30.8 mL/min (A)  Current therapy and lipid therapy tolerance Current lipid-lowering therapy: crestor 5mg  every day (intolerant to other statins) + praluent PTA with LDL 33 at goal    Plan:    1.Statin intensity (high intensity recommended for all patients regardless of the LDL):  No statin changes. The patient is already on a high intensity statin.    Leota Sauers Pharm.D. CPP, BCPS Clinical Pharmacist [phone] 05/11/2023 3:11 PM

## 2023-05-11 NOTE — Progress Notes (Signed)
 Pressure was held on right femoral artery site for a total of because of recurrent bleeding which happened three times. After seemingly achieving hemostasis, bleeding recurred within 2-3 minutes. Md Sherral Hammers at bedside to assess. PT and DP doppler pulses were auscultated.

## 2023-05-11 NOTE — Progress Notes (Addendum)
  Daily Progress Note  S/p: Superior mesenteric artery stenting  Subjective: Doing well this morning, very appreciative  Objective: Vitals:   05/11/23 0629 05/11/23 0630  BP: (!) 110/38 (!) 110/38  Pulse: 63 62  Resp: 11 11  Temp:    SpO2: 93% 92%    Physical Examination No abdominal pain Dressing removed, no bleeding from the arteriotomy site Signals in the feet bilaterally Nonlabored breathing   ASSESSMENT/PLAN:  Patient is a 73 year old female postop day 1 superior mesenteric artery stenting with chronic mesenteric ischemia. Postoperatively, she had some hypotension.  She was given a unit of blood, responded well.  Overnight, she had some bleeding from the arteriotomy tract.  This was not from the artery, or other from soft tissue below the dermis.  This resolved.  Overall, I am happy with how the patient is doing. Patient is on 2 mcg of Levophed.  This will be weaned off this morning. Will continue current antiplatelet therapies PT, OT.  Plan for home tomorrow pending no issues.    Victorino Sparrow MD [facility] [phone] 05/11/2023  8:26 AM

## 2023-05-11 NOTE — Progress Notes (Signed)
   NAME:  Teresa [name], MRN:  [id number], DOB:  04/23/1950, LOS: 0 ADMISSION DATE:  05/10/2023, CONSULTATION DATE:  05/10/2023 REFERRING MD:  Heath Lark, MD CHIEF COMPLAINT:  Hypotension   History of Present Illness:  73 y/o female with  history of COPD, lung CA s/p RUL resection 2014, CAD (prior stenting of the LM, LAD and circumflex in the setting of dissection), AS s/p TAVR 6/20 and systolic HF with recovered EF 21-30% 9/24, PAD, ABF bypass grafting and bilateral CFA endarterectomies 09/20/2021.  Patient presented for mesenteric ischemia and underwent: "PROCEDURE:   1) ultrasound guided right common femoral artery access 2) aortogram (150cc total contrast) 3) selective angiogram of celiac artery 4) selective angiogram of superior mesenteric artery 5) angioplasty and stenting of superior mesenteric artery (6x23mm Herculink) 6) conscious sedation (132 minutes)"   Patient developed Hypotension (SBP in 60's) and bradycardia (HR-40's) upon closure of groin.  Patient admitted to @[facility] .  She was ordered for 2 units PRBC as anticipated blood losses maybe more than anticipated with oozing. Pertinent  Medical History  COPD Lung CA s/p RUL resection 2014 CAD (prior stenting of the LM, LAD and circumflex in the setting of dissection),  AS s/p TAVR 6/20  PAD ABF bypass grafting and bilateral CFA endarterectomies 09/20/2021  Significant Hospital Events: Including procedures, antibiotic start and stop dates in addition to other pertinent events   3/7: Patient went to OR for angioplasty and stenting of superior mesenteric artery  -Patient developed bradycardia and hypotension  Interim History / Subjective:  Pain at levophed insertion site  Objective   Blood pressure (!) 105/47, pulse 69, temperature 98 F (36.7 C), temperature source Oral, resp. rate 16, height 4\' 11"  (1.499 m), weight 51 kg, last menstrual period 10/04/1999, SpO2 98%.        Intake/Output Summary (Last 24 hours) at 05/11/2023  1443 Last data filed at 05/11/2023 1100 Gross per 24 hour  Intake 2594.49 ml  Output 200 ml  Net 2394.49 ml   Filed Weights   05/10/23 1357 05/10/23 2000  Weight: 50.8 kg 51 kg    Examination: No distress Abd soft Eating spaghetti Ext warm Moves to command Groin looks okay   Resolved Hospital Problem list   N/a  Assessment & Plan:  Chronic mesenteric ischemia s/p angiogram with stenting of SMA 05/10/23 with postop course complicated by mild vasoplegia  Infiltrated levophed LLE  - Vasopressor extravasation protocol nitroglycerin cream and phentolamine - New USGIV and levophed PRN SBP > 90 (chronically low at home) - Hold PTA bisoprolol - Encourage PO - Antiplatelet agents per [facility] - Home pending BP improvement, making sure LUE looks okay  My cc time 31 mins Myrla Halsted MD PCCM

## 2023-05-11 NOTE — Evaluation (Signed)
 Physical Therapy Brief Evaluation and Discharge Note Patient Details Name: Teresa [name] MRN: [id number] DOB: [birth year] Today's Date: 05/11/2023   History of Present Illness  73 yo female admitted 3/7 for mesenteric a. stent limited by post op bleeding. PMhx: COPD, CAD, TAVR, CHF, HLD, ICM, PVD, lung CA s/p Rt lobectomy, aortobifem BPG  Clinical Impression  Pt pleasant, eager to move and return home. Pt without bleeding at groin throughout mobility and able to complete transfers and gait without significant assist. Pt at baseline functional level without further therapy needs. Will sign off with pt aware and agreeable and encouraged to walk with nursing.   HR 70 SPO2 97% on RA        PT Assessment Patient does not need any further PT services  Assistance Needed at Discharge  None    Equipment Recommendations None recommended by PT  Recommendations for Other Services       Precautions/Restrictions Precautions Precautions: None        Mobility  Bed Mobility Rolling: Modified independent (Device/Increase time)     General bed mobility comments: HOB 20 degrees, assist for lines only  Transfers Overall transfer level: Modified independent                 General transfer comment: pt able to rise from bed and toilet without physical assist, assist for lines    Ambulation/Gait Ambulation/Gait assistance: Supervision Gait Distance (Feet): 400 Feet Assistive device: None, 1 person hand held assist Gait Pattern/deviations: WFL(Within Functional Limits) Gait Speed: Pace WFL General Gait Details: intermittent hand held assist for pt reassurance but not required, assist for lines with pt with steady gait and normal gait speed  Home Activity Instructions    Stairs            Modified Rankin (Stroke Patients Only)        Balance Overall balance assessment: No apparent balance deficits (not formally assessed)                         Pertinent Vitals/Pain PT - Brief Vital Signs All Vital Signs Stable: Yes Pain Assessment Pain Assessment: 0-10 Pain Score: 4  Pain Location: sore Rt groin Pain Descriptors / Indicators: Sore Pain Intervention(s): Limited activity within patient's tolerance, Monitored during session, Repositioned     Home Living Family/patient expects to be discharged to:: Private residence Living Arrangements: Spouse/significant other Available Help at Discharge: Family Home Environment: Stairs to enter  [place] of [place]: 1 Home Equipment: Agricultural consultant (2 wheels);Cane - single point        Prior Function Level of Independence: Independent Comments: independent, driving, cares for 2 toddlers during the day    UE/LE Assessment   UE ROM/Strength/Tone/Coordination: [facility]    LE ROM/Strength/Tone/Coordination: [facility]      Communication   Communication Communication: No apparent difficulties     Cognition Overall Cognitive Status: Appears within functional limits for tasks assessed/performed       General Comments      Exercises     Assessment/Plan    PT Problem List         PT Visit Diagnosis Other abnormalities of gait and mobility (R26.89)    No Skilled PT All education completed;Patient is modified independent with all activity/mobility   Co-evaluation                AMPAC 6 Clicks Help needed turning from your back to your side while in a flat bed without  using bedrails?: None Help needed moving from lying on your back to sitting on the side of a flat bed without using bedrails?: None Help needed moving to and from a bed to a chair (including a wheelchair)?: None Help needed standing up from a chair using your arms (e.g., wheelchair or bedside chair)?: None Help needed to walk in hospital room?: None Help needed climbing 3-5 steps with a railing? : None 6 Click Score: 24      End of Session   Activity Tolerance: Patient tolerated treatment  well Patient left: in chair;with call bell/phone within reach Nurse Communication: Mobility status PT Visit Diagnosis: Other abnormalities of gait and mobility (R26.89)     Time: 0950-1009 PT Time Calculation (min) (ACUTE ONLY): 19 min  Charges:   PT Evaluation $PT Eval Low Complexity: 1 Low      Braylea Brancato P, PT Acute Rehabilitation Services Office: [phone]   Enedina Finner Farryn Linares  05/11/2023, 10:19 AM

## 2023-05-11 NOTE — Progress Notes (Addendum)
 Pt with asymptomatic persistent hypotension. Will continue to wean levo. Giving a small fluid bolus and obtaining EKG. Will hold on floor transfer at this time  Victorino Sparrow MD

## 2023-05-11 NOTE — Progress Notes (Signed)
 Patient seen and evaluated around midnight.  No bleeding from the right groin.  Dressing placed.  Will continue to follow closely.  No concern for hematoma.  Subcutaneous /skin ooze.      Victorino Sparrow MD

## 2023-05-11 NOTE — Progress Notes (Signed)
 Patient doing well this afternoon.  Weaning Levophed. Will tolerate systolic blood pressures greater than 90. Run low is low at home.  Teresa [name]

## 2023-05-12 ENCOUNTER — Encounter (HOSPITAL_COMMUNITY): Payer: Self-pay | Admitting: Vascular Surgery

## 2023-05-12 LAB — BASIC METABOLIC PANEL
Anion gap: 4 — ABNORMAL LOW (ref 5–15)
BUN: 18 mg/dL (ref 8–23)
CO2: 21 mmol/L — ABNORMAL LOW (ref 22–32)
Calcium: 7.7 mg/dL — ABNORMAL LOW (ref 8.9–10.3)
Chloride: 112 mmol/L — ABNORMAL HIGH (ref 98–111)
Creatinine, Ser: 0.92 mg/dL (ref 0.44–1.00)
GFR, Estimated: >60 mL/min (ref >60)
Glucose, Bld: 91 mg/dL (ref 70–99)
Potassium: 3.8 mmol/L (ref 3.5–5.1)
Sodium: 137 mmol/L (ref 135–145)

## 2023-05-12 NOTE — Progress Notes (Signed)
  Daily Progress Note  S/p: Superior mesenteric artery stenting  Subjective: Doing well this morning, feels better than yesterday  Objective: Vitals:   05/12/23 0700 05/12/23 0800  BP: 134/64 (!) 130/53  Pulse: 96 76  Resp: 20 19  Temp: 97.6 F (36.4 C)   SpO2: 96% 98%    Physical Examination No abdominal pain Groins soft Signals in the feet bilaterally Nonlabored breathing Left forearm with bandage, some nitro after levo infiltration.   ASSESSMENT/PLAN:  Patient is a 73 year old female postop day 1 superior mesenteric artery stenting with chronic mesenteric ischemia. Postoperatively, she had some hypotension.  She was given a unit of blood, responded well.    Levo was turned off yesterday. Normal pressures Will assess arm, if no issues, will DC home.    Victorino Sparrow MD MS [facility] [phone] 05/12/2023  9:14 AM

## 2023-05-12 NOTE — Progress Notes (Signed)
 Discharge instructions explained to patient and husband.  Alll questions addressed and answered. Bilateral PIVs removed w/gauze dressing at sites.  Right groin site assessed, no hematoma noted.  Patient transported to vehicle w/husband via wheelchair.  Patient awake, alert and oriented at time of discharge.

## 2023-05-12 NOTE — Discharge Instructions (Signed)
   [facility]  Discharge Instructions  Lower Extremity Angiogram; Angioplasty/Stenting  Please refer to the following instructions for your post-procedure care. Your surgeon or physician assistant will discuss any changes with you.  Activity  Avoid lifting more than 8 pounds (1 gallons of milk) for 72 hours (3 days) after your procedure. You may walk as much as you can tolerate. It's OK to drive after 72 hours.  Bathing/Showering  You may shower the day after your procedure. If you have a bandage, you may remove it at 24- 48 hours. Clean your incision site with mild soap and water. Pat the area dry with a clean towel.  Diet  Resume your pre-procedure diet. There are no special food restrictions following this procedure. All patients with peripheral vascular disease should follow a low fat/low cholesterol diet. In order to heal from your surgery, it is CRITICAL to get adequate nutrition. Your body requires vitamins, minerals, and protein. Vegetables are the best source of vitamins and minerals. Vegetables also provide the perfect balance of protein. Processed food has little nutritional value, so try to avoid this.  Medications  Resume taking all of your medications unless your doctor tells you not to. If your incision is causing pain, you may take over-the-counter pain relievers such as acetaminophen (Tylenol)  Follow Up  Follow up will be arranged at the time of your procedure. You may have an office visit scheduled or may be scheduled for surgery. Ask your surgeon if you have any questions.  Please call us immediately for any of the following conditions: Severe or worsening pain your legs or feet at rest or with walking. Increased pain, redness, drainage at your groin puncture site. Fever of 101 degrees or higher. If you have any mild or slow bleeding from your puncture site: lie down, apply firm constant pressure over the area with a piece of  gauze or a clean wash cloth for 30 minutes- no peeking!, call 911 right away if you are still bleeding after 30 minutes, or if the bleeding is heavy and unmanageable.  Reduce your risk factors of vascular disease:  Stop smoking. If you would like help call [facility] at 1-800-QUIT-NOW (254-819-8059) or [place] at (250)353-2060. Manage your cholesterol Maintain a desired weight Control your diabetes Keep your blood pressure down  If you have any questions, please call the office at [phone]

## 2023-05-13 ENCOUNTER — Telehealth (HOSPITAL_COMMUNITY): Payer: Self-pay | Admitting: Cardiology

## 2023-05-13 NOTE — Telephone Encounter (Signed)
 Patient called to see if she should stop jardiance as recommended by PCP, given abx for uti  Reports UTI x 5 since being on medication  Reports Dr Gala Romney stopped entresto at 3/4 OV however was under the impression she should not stop jardiance even with UTI's   Please advise

## 2023-05-13 NOTE — Telephone Encounter (Signed)
Pt aware.

## 2023-05-13 NOTE — Discharge Summary (Signed)
 Discharge Summary  Patient ID: Teresa [name] [id number] 73 y.o. [birth year]  Admit date: 05/10/2023  Discharge date and time: 05/12/2023  2:57 PM   Admitting Physician: Leonie Douglas, MD   Discharge Physician: Dr. Karin Lieu  Admission Diagnoses: Chronic mesenteric ischemia [facility]) [K55.1] Mesenteric artery stenosis [facility]) [K55.1]  Discharge Diagnoses: same  Admission Condition: fair  Discharged Condition: fair  Indication for Admission: Postoperative hypotension  Hospital Course: Teresa [name] is a 72 year old female who was brought in as an outpatient and underwent aortogram with angioplasty and stenting of the superior mesenteric artery by Dr. Lenell Antu on 05/10/2023.  After closing the groin she developed severe bradycardia and hypotension.  She was given IV fluid bolus and started on Levophed.  She was also transfused 1 unit of packed red blood cells and transferred to the ICU.  Critical care was consulted for assistance.  Over the next 24 hours pressor support was weaned.  The right groin was without any hematoma or palpable pseudoaneurysm throughout the admission.  Postop day #2 vital signs were stable and she was ready for discharge home.  She will follow-up in the office in 4 to 6 weeks with a mesenteric duplex.  She was discharged home in stable condition.  Consults: pulmonary/intensive care  Treatments: surgery: Aortogram with angioplasty and stenting of the superior mesenteric artery by Dr. Lenell Antu on 05/10/2023  Discharge Exam: See progress note 05/12/23 Vitals:   05/12/23 1100 05/12/23 1200  BP: 131/65 128/65  Pulse: 70 72  Resp: (!) 21 (!) 21  Temp:    SpO2: 97% 98%     Disposition: Discharge disposition: 01-Home or Self Care       Patient Instructions:  Allergies as of 05/12/2023       Reactions   Azithromycin Shortness Of Breath   Other Reaction(s): burning rash/body   Cefaclor Rash, Shortness Of Breath   Other Reaction(s): breathing difficulty    Penicillins Other (See Comments)   Unknown from childhood Did it involve swelling of the face/tongue/throat, SOB, or low BP? No Did it involve sudden or severe rash/hives, skin peeling, or any reaction on the inside of your mouth or nose? No Did you need to seek medical attention at a hospital or doctor's office? Yes When did it last happen? Infancy  If all above answers are "NO", may proceed with cephalosporin use.   Septra [sulfamethoxazole-trimethoprim] Shortness Of Breath, Rash   Doxycycline Swelling, Other (See Comments)   Redness on the face   Lipitor [atorvastatin] Other (See Comments)   Severe muscle aches   Morphine And Codeine Nausea And Vomiting, Other (See Comments)   Terrible headache   Zetia [ezetimibe] Other (See Comments)   Severe stomach pain   Zocor [simvastatin] Other (See Comments)   Severe muscle aches   Adhesive [tape] Other (See Comments)   Redness and swelling - use paper tape   Vicodin [hydrocodone-acetaminophen] Nausea And Vomiting        Medication List     TAKE these medications    acetaminophen 500 MG tablet Commonly known as: TYLENOL Take 1,000 mg by mouth every 6 (six) hours as needed for moderate pain.   AeroChamber MV inhaler Use as instructed   albuterol 108 (90 Base) MCG/ACT inhaler Commonly known as: VENTOLIN HFA Inhale 2 puffs into the lungs every 6 (six) hours as needed for wheezing or shortness of breath.   albuterol (2.5 MG/3ML) 0.083% nebulizer solution Commonly known as: PROVENTIL Take 2.5 mg by nebulization every 6 (six) hours  as needed for shortness of breath or wheezing.   alendronate 70 MG tablet Commonly known as: FOSAMAX Take 70 mg by mouth once a week. Take with a full glass of water on an empty stomach.   bisoprolol 5 MG tablet Commonly known as: ZEBETA Take 0.5 tablets (2.5 mg total) by mouth daily.   CALCIUM 600 + D PO Take 1 tablet by mouth daily.   cholecalciferol 25 MCG (1000 UNIT) tablet Commonly known  as: VITAMIN D3 Take 1,000 Units by mouth daily.   clindamycin 150 MG capsule Commonly known as: CLEOCIN Take 600 mg by mouth See admin instructions. Take 600 mg by mouth 1 hour prior to dental appointment   clopidogrel 75 MG tablet Commonly known as: PLAVIX Take 1 tablet (75 mg total) by mouth daily.   furosemide 40 MG tablet Commonly known as: LASIX TAKE 1 TABLET BY MOUTH AS NEEDED FOR FLUID OR EDMA   Hypromellose 0.2 % Soln Place 2 drops into both eyes 4 (four) times daily as needed (dry eyes).   Jardiance 10 MG Tabs tablet Generic drug: empagliflozin Take 1 tablet (10 mg total) by mouth daily.   lansoprazole 30 MG capsule Commonly known as: PREVACID Take 30 mg by mouth in the morning.   NASAL SALINE NA Place 1 spray into the nose every 4 (four) hours as needed (congestion).   nitroGLYCERIN 0.4 MG SL tablet Commonly known as: NITROSTAT DISSOLVE ONE TABLET UNDER TONGUE AS NEEDED FOR CHEST PAIN EVERY 5 MINUTES   Praluent 150 MG/ML Soaj Generic drug: Alirocumab Inject 1 mL (150 mg total) into the skin every 14 (fourteen) days.   rosuvastatin 5 MG tablet Commonly known as: CRESTOR Take 1 tablet (5 mg total) by mouth 3 (three) times a week.   simethicone 80 MG chewable tablet Commonly known as: MYLICON Chew 2 tablets (160 mg total) by mouth every 6 (six) hours as needed for flatulence.   spironolactone 25 MG tablet Commonly known as: ALDACTONE Take 0.5 tablets (12.5 mg total) by mouth daily.   Trelegy Ellipta 100-62.5-25 MCG/INH Aepb Generic drug: Fluticasone-Umeclidin-Vilant Inhale 1 puff into the lungs daily.   Zantac 360 10 MG tablet Generic drug: famotidine Take 10 mg by mouth daily as needed for heartburn or indigestion.       Activity: activity as tolerated Diet: regular diet Wound Care: none needed  Follow-up with [facility] in 5 weeks.  SignedEmilie Rutter, PA-C 05/13/2023 12:30 PM [facility] Office: [phone]

## 2023-05-14 DIAGNOSIS — I5042 Chronic combined systolic (congestive) and diastolic (congestive) heart failure: Secondary | ICD-10-CM | POA: Diagnosis not present

## 2023-05-14 DIAGNOSIS — I739 Peripheral vascular disease, unspecified: Secondary | ICD-10-CM | POA: Diagnosis not present

## 2023-05-14 DIAGNOSIS — I2510 Atherosclerotic heart disease of native coronary artery without angina pectoris: Secondary | ICD-10-CM | POA: Diagnosis not present

## 2023-05-14 DIAGNOSIS — J440 Chronic obstructive pulmonary disease with acute lower respiratory infection: Secondary | ICD-10-CM | POA: Diagnosis not present

## 2023-05-14 DIAGNOSIS — L304 Erythema intertrigo: Secondary | ICD-10-CM | POA: Diagnosis not present

## 2023-05-14 DIAGNOSIS — K55059 Acute (reversible) ischemia of intestine, part and extent unspecified: Secondary | ICD-10-CM | POA: Diagnosis not present

## 2023-05-14 DIAGNOSIS — I700 Atherosclerosis of aorta: Secondary | ICD-10-CM | POA: Diagnosis not present

## 2023-05-27 ENCOUNTER — Other Ambulatory Visit: Payer: Self-pay

## 2023-05-27 DIAGNOSIS — K551 Chronic vascular disorders of intestine: Secondary | ICD-10-CM

## 2023-05-30 ENCOUNTER — Ambulatory Visit: Payer: PPO | Attending: Cardiovascular Disease | Admitting: Cardiovascular Disease

## 2023-05-30 ENCOUNTER — Encounter: Payer: Self-pay | Admitting: Cardiovascular Disease

## 2023-05-30 VITALS — BP 108/64 | HR 63 | Ht 59.0 in | Wt 115.8 lb

## 2023-05-30 DIAGNOSIS — I359 Nonrheumatic aortic valve disorder, unspecified: Secondary | ICD-10-CM | POA: Diagnosis not present

## 2023-05-30 DIAGNOSIS — I739 Peripheral vascular disease, unspecified: Secondary | ICD-10-CM

## 2023-05-30 DIAGNOSIS — I10 Essential (primary) hypertension: Secondary | ICD-10-CM | POA: Diagnosis not present

## 2023-05-30 DIAGNOSIS — I5042 Chronic combined systolic (congestive) and diastolic (congestive) heart failure: Secondary | ICD-10-CM | POA: Diagnosis not present

## 2023-05-30 DIAGNOSIS — E785 Hyperlipidemia, unspecified: Secondary | ICD-10-CM

## 2023-05-30 DIAGNOSIS — I2510 Atherosclerotic heart disease of native coronary artery without angina pectoris: Secondary | ICD-10-CM

## 2023-05-30 DIAGNOSIS — I255 Ischemic cardiomyopathy: Secondary | ICD-10-CM | POA: Diagnosis not present

## 2023-05-30 NOTE — Patient Instructions (Signed)
Medication Instructions:  No changes *If you need a refill on your cardiac medications before your next appointment, please call your pharmacy*   Lab Work: none If you have labs (blood work) drawn today and your tests are completely normal, you will receive your results only by: [place] (if you have MyChart) OR A paper copy in the mail If you have any lab test that is abnormal or we need to change your treatment, we will call you to review the results.   Testing/Procedures: none   Follow-Up: At [facility], you and your health needs are our priority.  As part of our continuing mission to provide you with exceptional heart care, we have created designated Provider Care Teams.  These Care Teams include your primary Cardiologist (physician) and Advanced Practice Providers (APPs -  Physician Assistants and Nurse Practitioners) who all work together to provide you with the care you need, when you need it.   Your next appointment:   12 month(s)  Provider:   Lauree Chandler, MD     Other Instructions

## 2023-05-30 NOTE — Progress Notes (Signed)
 Chief Complaint  Patient presents with   Follow-up    CAD   History of Present Illness: 73 yo female with history of lung cancer, CAD, aortic stenosis s/p TAVR, ischemic cardiomyopathy, chronic diastolic CHF, hyperlipidemia, PAD and COPD here today for follow up. She has been followed in our office by Dr. Eldridge Dace. She has CAD and had complications during a prior cardiac cath with dissection requiring stent placement in the left main, Circumflex and LAD. Last cardiac cath in June 2020 with patent left main, Circumflex, LAD and mid RCA stents. She had severe aortic stenosis and had TAVR in June 2020 and had a 23 mm Edwards Sapien 3 valve placed from the transfemoral approach. Echo September 2024 with LVEF=45-50%. Normal RV function. Normally functioning bioprosthetic AVR with trivial PVL. Her cardiomyopathy and chronic systolic CHF is followed in the Advanced heart Failure clinic by dr. Gala Romney. She has PAD and mesenteric ischemia followed in [facility] by Dr. Lenell Antu and has had aorto femoral bypass in 2023. She had stenting of the superior mesenteric artery in March 2025.   She is here today for follow up. No exertional chest pain. No dyspnea, palpitations, lower extremity edema, orthopnea, PND, dizziness, near syncope or syncope.   Primary Care Physician: Lorenda Ishihara, MD   Past Medical History:  Diagnosis Date   AAA (abdominal aortic aneurysm) (HCC)    a. small by cath 2014.   Acute on chronic combined systolic and diastolic heart failure (HCC) 08/19/2018   Arthritis    "across my hips; buttocks; comes w/the weather" (02/27/2013)   CAD (coronary artery disease), native coronary artery    a. 10/2012 - rotational atherectomy of LAD c/b dissection s/p PCI to LAD, LCx, LM. b. 12/2012 - s/p overlapping DES to RCA.   Cancer [facility])    Cataract    "just the beginnings on the right" (02/27/2013)   Chronic combined systolic and diastolic CHF (congestive heart failure) (HCC)    COPD (chronic  obstructive pulmonary disease) (HCC)    Coronary artery disease    GERD (gastroesophageal reflux disease)    Hypercholesterolemia    Ischemic cardiomyopathy    a. EF 45-50%.   Mass of lung    "small tumor RUL; they are watching it" (02/27/2013)   Mitral regurgitation    Peripheral vascular disease, unspecified (HCC)    Pneumonia    S/P TAVR (transcatheter aortic valve replacement)    23 mm Edwards Sapien 3 transcatheter heart valve placed via open left transaxillary approach    Severe aortic stenosis    Severe mitral regurgitation    Tachycardia, unspecified    Tobacco abuse     Past Surgical History:  Procedure Laterality Date   ABDOMINAL AORTOGRAM W/LOWER EXTREMITY N/A 08/03/2021   Procedure: ABDOMINAL AORTOGRAM W/LOWER EXTREMITY;  Surgeon: Cephus Shelling, MD;  Location: [facility] INVAS[facility] CV LAB;  Service: Cardiovascular;  Laterality: N/A;   ABDOMINAL AORTOGRAM W/LOWER EXTREMITY N/A 04/13/2022   Procedure: ABDOMINAL AORTOGRAM W/LOWER EXTREMITY;  Surgeon: Leonie Douglas, MD;  Location: [facility] INV[facility] CV [facility];  Service: Cardiovascular;  Laterality: N/A;   AORTA - BILATERAL FEMORAL ARTERY BYPASS GRAFT Bilateral 09/20/2021   Procedure: AORTA BIFEMORAL BYPASS GRAFT WITH EPIDURAL CATHETER and  aortic endartarectomey and bilateral femoral endartarectomies.;  Surgeon: Leonie Douglas, MD;  Location: [facility] OR;  Service: Vascular;  Laterality: Bilateral;   CARDIAC CATHETERIZATION  08/2018   CORONARY ANGIOPLASTY WITH STENT PLACEMENT  10/2012; 12/08/2012   "7 + 3" (02/27/2013)   ESOPHAGOGASTRODUODENOSCOPY (EGD)  WITH PROPOFOL N/A 01/11/2015   Procedure: ESOPHAGOGASTRODUODENOSCOPY (EGD) WITH PROPOFOL;  Surgeon: Charolett Bumpers, MD;  Location: [facility] ENDOSCOPY;  Service: Endoscopy;  Laterality: N/A;   FLEXIBLE BRONCHOSCOPY N/A 03/02/2013   Procedure: FLEXIBLE BRONCHOSCOPY;  Surgeon: Alleen Borne, MD;  Location: [facility] OR;  Service: Thoracic;  Laterality: N/A;   FLEXIBLE SIGMOIDOSCOPY N/A 01/11/2015   Procedure:  FLEXIBLE SIGMOIDOSCOPY;  Surgeon: Charolett Bumpers, MD;  Location: [facility] ENDOSCOPY;  Service: Endoscopy;  Laterality: N/A;  unable to complete colon-prep issues   GANGLION CYST EXCISION Left 1975   "wrist"   INCISIONAL HERNIA REPAIR N/A 08/30/2022   Procedure: INCISIONAL HERNIA REPAIR WITH MESH;  Surgeon: Kinsinger, [name], MD;  Location: [facility] ORS;  Service: General;  Laterality: N/A;   LEFT HEART CATH AND CORONARY ANGIOGRAPHY N/A 06/29/2016   Procedure: Left Heart Cath and Coronary Angiography;  Surgeon: Corky Crafts, MD;  Location: [facility] INVASIVE CV LAB;  Service: Cardiovascular;  Laterality: N/A;   LEFT HEART CATH AND CORONARY ANGIOGRAPHY N/A 08/19/2018   Procedure: LEFT HEART CATH AND CORONARY ANGIOGRAPHY;  Surgeon: Kathleene Hazel, MD;  Location: [facility];  Service: Cardiovascular;  Laterality: N/A;   LEFT HEART CATHETERIZATION WITH CORONARY ANGIOGRAM N/A 12/08/2012   Procedure: LEFT HEART CATHETERIZATION WITH CORONARY ANGIOGRAM;  Surgeon: Corky Crafts, MD;  Location: [facility] CATH LAB;  Service: Cardiovascular;  Laterality: N/A;   MOUTH SURGERY  2010?   "for bone loss" (06/24/2012)   PERCUTANEOUS CORONARY STENT INTERVENTION (PCI-S) N/A 10/09/2012   Procedure: PERCUTANEOUS CORONARY STENT INTERVENTION (PCI-S);  Surgeon: Corky Crafts, MD;  Location: [facility] CATH LAB;  Service: Cardiovascular;  Laterality: N/A;   RIGHT HEART CATH N/A 08/19/2018   Procedure: RIGHT HEART CATH;  Surgeon: Kathleene Hazel, MD;  Location: [facility];  Service: Cardiovascular;  Laterality: N/A;   TEE WITHOUT CARDIOVERSION N/A 08/22/2018   Procedure: TRANSESOPHAGEAL ECHOCARDIOGRAM (TEE);  Surgeon: Chrystie Nose, MD;  Location: [facility] ENDOSCOPY;  Service: Cardiovascular;  Laterality: N/A;   TEE WITHOUT CARDIOVERSION N/A 08/26/2018   Procedure: TRANSESOPHAGEAL ECHOCARDIOGRAM (TEE);  Surgeon: Kathleene Hazel, MD;  Location: [facility] OR;  Service: Open Heart Surgery;  Laterality: N/A;    THORACOTOMY/LOBECTOMY Right 03/02/2013   Procedure: Right Video Assisted Thoracoscopy/Thoracotomy with upper Lobectomy;  Surgeon: Alleen Borne, MD;  Location: [facility] OR;  Service: Thoracic;  Laterality: Right;  Right Lung Upper  Lobectomy    TONSILLECTOMY  1960   VIDEO BRONCHOSCOPY  09/12/2011   Procedure: VIDEO BRONCHOSCOPY WITH FLUORO;  Surgeon: Nyoka Cowden, MD;  Location: [facility] ENDOSCOPY;  Service: Cardiopulmonary;  Laterality: Bilateral;   VISCERAL ANGIOGRAPHY N/A 05/10/2023   Procedure: VISCERAL ANGIOGRAPHY;  Surgeon: Leonie Douglas, MD;  Location: [facility] CV [facility];  Service: Cardiovascular;  Laterality: N/A;   VISCERAL ARTERY INTERVENTION N/A 05/10/2023   Procedure: VISCERAL ARTERY INTERVENTION;  Surgeon: Leonie Douglas, MD;  Location: [facility] INVAS[facility] CV LAB;  Service: Cardiovascular;  Laterality: N/A;  SMA    Current Outpatient Medications  Medication Sig Dispense Refill   acetaminophen (TYLENOL) 500 MG tablet Take 1,000 mg by mouth every 6 (six) hours as needed for moderate pain.     albuterol (PROVENTIL) (2.5 MG/3ML) 0.083% nebulizer solution Take 2.5 mg by nebulization every 6 (six) hours as needed for shortness of breath or wheezing.     albuterol (VENTOLIN HFA) 108 (90 Base) MCG/ACT inhaler Inhale 2 puffs into the lungs every 6 (six) hours as needed for wheezing or shortness of  breath.     alendronate (FOSAMAX) 70 MG tablet Take 70 mg by mouth once a week. Take with a full glass of water on an empty stomach.     Alirocumab (PRALUENT) 150 MG/ML SOAJ Inject 1 mL (150 mg total) into the skin every 14 (fourteen) days. 6 mL 1   BISACODYL 5 MG EC tablet Take 5 mg by mouth as directed. 1/2 TABLET DAILY     bisoprolol (ZEBETA) 5 MG tablet Take 0.5 tablets (2.5 mg total) by mouth daily. 45 tablet 3   Calcium Carb-Cholecalciferol (CALCIUM 600 + D PO) Take 1 tablet by mouth daily.     cholecalciferol (VITAMIN D3) 25 MCG (1000 UNIT) tablet Take 1,000 Units by mouth daily.     clindamycin (CLEOCIN)  150 MG capsule Take 600 mg by mouth See admin instructions. Take 600 mg by mouth 1 hour prior to dental appointment     clopidogrel (PLAVIX) 75 MG tablet Take 1 tablet (75 mg total) by mouth daily. 90 tablet 3   famotidine (ZANTAC 360) 10 MG tablet Take 10 mg by mouth daily as needed for heartburn or indigestion.     Fluticasone-Umeclidin-Vilant (TRELEGY ELLIPTA) 100-62.5-25 MCG/INH AEPB Inhale 1 puff into the lungs daily. 60 each 11   furosemide (LASIX) 40 MG tablet TAKE 1 TABLET BY MOUTH AS NEEDED FOR FLUID OR EDMA 90 tablet 0   Hypromellose 0.2 % SOLN Place 2 drops into both eyes 4 (four) times daily as needed (dry eyes).      lansoprazole (PREVACID) 30 MG capsule Take 30 mg by mouth in the morning.     NASAL SALINE NA Place 1 spray into the nose every 4 (four) hours as needed (congestion).     nitroGLYCERIN (NITROSTAT) 0.4 MG SL tablet DISSOLVE ONE TABLET UNDER TONGUE AS NEEDED FOR CHEST PAIN EVERY 5 MINUTES 25 tablet 6   nystatin cream (MYCOSTATIN) Apply 1 Application topically. FINISHING 06/01/23     rosuvastatin (CRESTOR) 5 MG tablet Take 1 tablet (5 mg total) by mouth 3 (three) times a week. 90 tablet 3   simethicone (MYLICON) 80 MG chewable tablet Chew 2 tablets (160 mg total) by mouth every 6 (six) hours as needed for flatulence. 30 tablet 0   Spacer/Aero-Holding Chambers (AEROCHAMBER MV) inhaler Use as instructed 1 each 0   spironolactone (ALDACTONE) 25 MG tablet Take 0.5 tablets (12.5 mg total) by mouth daily. 45 tablet 3   No current facility-administered medications for this visit.    Allergies  Allergen Reactions   Azithromycin Shortness Of Breath    Other Reaction(s): burning rash/body   Cefaclor Rash and Shortness Of Breath    Other Reaction(s): breathing difficulty   Penicillins Other (See Comments)    Unknown from childhood Did it involve swelling of the face/tongue/throat, SOB, or low BP? No Did it involve sudden or severe rash/hives, skin peeling, or any reaction on  the inside of your mouth or nose? No Did you need to seek medical attention at a hospital or doctor's office? Yes When did it last happen? Infancy  If all above answers are "NO", may proceed with cephalosporin use.    Septra [Sulfamethoxazole-Trimethoprim] Shortness Of Breath and Rash   Doxycycline Swelling and Other (See Comments)    Redness on the face   Lipitor [Atorvastatin] Other (See Comments)    Severe muscle aches   Morphine And Codeine Nausea And Vomiting and Other (See Comments)    Terrible headache   Zetia [Ezetimibe] Other (See Comments)  Severe stomach pain   Zocor [Simvastatin] Other (See Comments)    Severe muscle aches   Adhesive [Tape] Other (See Comments)    Redness and swelling - use paper tape   Vicodin [Hydrocodone-Acetaminophen] Nausea And Vomiting    Social History   Socioeconomic History   Marital status: Married    Spouse name: Not on file   Number of children: 1   Years of education: 12   Highest education level: High school graduate  Occupational History   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  Tobacco Use   Smoking status: Former    Current packs/day: 0.00    Average packs/day: 0.5 packs/day for 40.0 years (20.0 ttl pk-yrs)    Types: Cigarettes    Start date: 10/08/1972    Quit date: 10/08/2012    Years since quitting: 10.6   Smokeless tobacco: Never  Vaping Use   Vaping status: Never Used  Substance and Sexual Activity   Alcohol use: No   Drug use: No   Sexual activity: Yes  Other Topics Concern   Not on file  Social History Narrative   Not on file   Social Drivers of Health   Financial Resource Strain: Low Risk  (10/07/2018)   Overall Financial Resource Strain (CARDIA)    Difficulty of Paying Living Expenses: Not hard at all  Food Insecurity: No Food Insecurity (05/11/2023)   Hunger Vital Sign    Worried About Running Out of Food in the Last Year: Never true    Ran Out of Food in the Last Year: Never true  Transportation Needs: No  Transportation Needs (05/11/2023)   PRAPARE - Administrator, Civil Service (Medical): No    Lack of Transportation (Non-Medical): No  Physical Activity: Insufficiently Active (10/07/2018)   Exercise Vital Sign    Days of Exercise per Week: 4 days    Minutes of Exercise per Session: 30 min  Stress: No Stress Concern Present (10/07/2018)   [employer] of Occupational Health - Occupational Stress Questionnaire    Feeling of Stress : Only a little  Social Connections: Socially Integrated (05/11/2023)   Social Connection and Isolation Panel [NHANES]    Frequency of Communication with Friends and Family: More than three times a week    Frequency of Social Gatherings with Friends and Family: Twice a week    Attends Religious Services: 1 to 4 times per year    Active Member of [employer] or Organizations: No    Attends Engineer, structural: 1 to 4 times per year    Marital Status: Married  Catering manager Violence: Not At Risk (05/11/2023)   Humiliation, Afraid, Rape, and Kick questionnaire    Fear of Current or Ex-Partner: No    Emotionally Abused: No    Physically Abused: No    Sexually Abused: No    Family History  Problem Relation Age of Onset   Alcohol abuse Father    Lupus Mother    Other Sister        Degenerative disc disease   Migraines Sister    Pulmonary fibrosis Brother    Heart attack Neg Hx     Review of Systems:  As stated in the HPI and otherwise negative.   BP 108/64   Pulse 63   Ht 4\' 11"  (1.499 m)   Wt 52.5 kg   LMP 10/04/1999   SpO2 94%   BMI 23.39 kg/m   Physical Examination: General: Well developed, well nourished, NAD  HEENT: OP  clear, mucus membranes moist  SKIN: warm, dry. No rashes. Neuro: No focal deficits  Musculoskeletal: Muscle strength 5/5 all ext  Psychiatric: Mood and affect normal  Neck: No JVD, no carotid bruits, no thyromegaly, no lymphadenopathy.  Lungs:Clear bilaterally, no wheezes, rhonci, crackles Cardiovascular:  Regular rate and rhythm. Soft systolic murmur.  Abdomen:Soft. Bowel sounds present. Non-tender.  Extremities: No lower extremity edema.   EKG:  EKG is not ordered today. The ekg ordered today demonstrates   Recent Labs: 05/11/2023: Hemoglobin 10.0; Platelets 255 05/12/2023: BUN 18; Creatinine, Ser 0.92; Potassium 3.8; Sodium 137   Lipid Panel    Component Value Date/Time   CHOL 93 05/10/2023 2353   CHOL 169 09/06/2021 0956   TRIG 111 05/10/2023 2353   HDL 38 (L) 05/10/2023 2353   HDL 51 09/06/2021 0956   CHOLHDL 2.4 05/10/2023 2353   VLDL 22 05/10/2023 2353   LDLCALC 33 05/10/2023 2353   LDLCALC 101 (H) 09/06/2021 0956     Wt Readings from Last 3 Encounters:  05/30/23 52.5 kg  05/10/23 51 kg  05/09/23 51.9 kg    Assessment and Plan:   1. CAD without angina: No angina. Continue Plavix, statin and beta blocker.   2. Aortic stenosis s/p TAVR: AVR working well by echo in September 2024. Continue Plavix. SBE prophylaxis as needed.   3. HTN: BP is good today. No changes.   4. Hyperlipidemia: LDL 33 in march 2025. Continue Crestor and Praluent.   5. Ischemic cardiomyopathy with chronic systolic CHF: LVEF=50% by echo in September 2024. She is now followed in the Advanced Heart Failure clinic by Dr. Gala Romney. She is on Lasix, aldactone, Jardiance and bisoprolol. Entresto was stopped due to soft BP.   6. PAD: Followed by [facility], Dr. Lenell Antu  Labs/ tests ordered today include:  No orders of the defined types were placed in this encounter.  Disposition:   F/U with me in one year   Signed, Verne Carrow, MD, [facility] 05/30/2023 10:49 AM    [facility] [address] [place], [place], [place]  [address] Phone: [phone]; Fax: [phone]

## 2023-06-03 ENCOUNTER — Other Ambulatory Visit (HOSPITAL_COMMUNITY): Payer: Self-pay

## 2023-06-03 ENCOUNTER — Other Ambulatory Visit: Payer: Self-pay

## 2023-06-03 MED ORDER — OMEPRAZOLE 20 MG PO CPDR
20.0000 mg | DELAYED_RELEASE_CAPSULE | Freq: Every day | ORAL | 2 refills | Status: DC | PRN
  Filled 2023-06-03: qty 30, 30d supply, fill #0

## 2023-06-03 MED ORDER — LANSOPRAZOLE 30 MG PO CPDR
30.0000 mg | DELAYED_RELEASE_CAPSULE | Freq: Every day | ORAL | 1 refills | Status: AC
  Filled 2023-06-04 (×2): qty 90, 90d supply, fill #0

## 2023-06-03 MED ORDER — PRALUENT 150 MG/ML ~~LOC~~ SOAJ
150.0000 mg | SUBCUTANEOUS | 3 refills | Status: DC
  Filled 2023-06-04 (×2): qty 6, 84d supply, fill #0

## 2023-06-03 MED ORDER — BISOPROLOL FUMARATE 5 MG PO TABS
2.5000 mg | ORAL_TABLET | Freq: Every day | ORAL | 3 refills | Status: DC
  Filled 2023-06-04 (×2): qty 45, 90d supply, fill #0

## 2023-06-03 MED ORDER — CLINDAMYCIN HCL 150 MG PO CAPS
600.0000 mg | ORAL_CAPSULE | ORAL | 2 refills | Status: DC
Start: 2022-12-03 — End: 2023-06-05
  Filled 2023-06-03: qty 4, 1d supply, fill #0
  Filled 2023-06-04: qty 4, 1d supply, fill #1

## 2023-06-03 MED ORDER — ROSUVASTATIN CALCIUM 5 MG PO TABS
5.0000 mg | ORAL_TABLET | ORAL | 3 refills | Status: DC
  Filled 2023-06-13: qty 45, 105d supply, fill #0
  Filled 2023-08-03: qty 15, 35d supply, fill #0
  Filled 2023-10-23: qty 45, 100d supply, fill #0

## 2023-06-03 MED ORDER — LANSOPRAZOLE 30 MG PO CPDR
30.0000 mg | DELAYED_RELEASE_CAPSULE | Freq: Every day | ORAL | 3 refills | Status: DC
Start: 2022-12-28 — End: 2023-11-25
  Filled 2023-06-03: qty 90, 90d supply, fill #0
  Filled 2023-06-04 – 2023-08-28 (×2): qty 90, 90d supply, fill #1

## 2023-06-03 MED ORDER — ALENDRONATE SODIUM 70 MG PO TABS
70.0000 mg | ORAL_TABLET | ORAL | 11 refills | Status: DC
  Filled 2023-06-04: qty 4, 28d supply, fill #0
  Filled 2023-06-05 – 2023-06-28 (×2): qty 4, 28d supply, fill #1
  Filled 2023-07-26: qty 4, 28d supply, fill #2
  Filled 2023-08-23: qty 4, 28d supply, fill #3
  Filled 2023-09-18: qty 4, 28d supply, fill #4
  Filled 2023-10-13: qty 4, 28d supply, fill #5
  Filled 2023-11-10: qty 4, 28d supply, fill #6
  Filled 2023-12-06: qty 4, 28d supply, fill #7
  Filled 2024-01-02: qty 4, 28d supply, fill #8

## 2023-06-03 MED ORDER — TRELEGY ELLIPTA 100-62.5-25 MCG/ACT IN AEPB
1.0000 | INHALATION_SPRAY | Freq: Every day | RESPIRATORY_TRACT | 11 refills | Status: DC
Start: 2023-01-01 — End: 2024-01-02
  Filled 2023-06-04 – 2023-06-13 (×5): qty 60, 30d supply, fill #0
  Filled 2023-07-08: qty 60, 30d supply, fill #1
  Filled 2023-08-07: qty 60, 30d supply, fill #2
  Filled 2023-10-08: qty 60, 30d supply, fill #3
  Filled 2023-11-02: qty 60, 30d supply, fill #4
  Filled 2023-12-02: qty 60, 30d supply, fill #5

## 2023-06-03 MED ORDER — ALBUTEROL SULFATE (2.5 MG/3ML) 0.083% IN NEBU
3.0000 mL | INHALATION_SOLUTION | Freq: Four times a day (QID) | RESPIRATORY_TRACT | 3 refills | Status: AC | PRN
  Filled 2024-01-02: qty 360, 30d supply, fill #0

## 2023-06-04 ENCOUNTER — Other Ambulatory Visit: Payer: Self-pay

## 2023-06-04 ENCOUNTER — Other Ambulatory Visit (HOSPITAL_COMMUNITY): Payer: Self-pay

## 2023-06-04 MED ORDER — CLOPIDOGREL BISULFATE 75 MG PO TABS
75.0000 mg | ORAL_TABLET | Freq: Every day | ORAL | 3 refills | Status: AC
  Filled 2023-06-04: qty 90, 90d supply, fill #0
  Filled 2023-06-05 – 2023-08-29 (×3): qty 90, 90d supply, fill #1
  Filled 2023-11-30: qty 90, 90d supply, fill #2
  Filled 2024-02-23: qty 90, 90d supply, fill #3

## 2023-06-05 ENCOUNTER — Telehealth (HOSPITAL_COMMUNITY): Payer: Self-pay

## 2023-06-05 ENCOUNTER — Other Ambulatory Visit (HOSPITAL_COMMUNITY): Payer: Self-pay

## 2023-06-05 ENCOUNTER — Ambulatory Visit (HOSPITAL_COMMUNITY)
Admission: RE | Admit: 2023-06-05 | Discharge: 2023-06-05 | Disposition: A | Discharge status: Home / Self Care | Source: Ambulatory Visit | Attending: Family Medicine | Admitting: Family Medicine

## 2023-06-05 ENCOUNTER — Encounter (HOSPITAL_COMMUNITY): Payer: Self-pay

## 2023-06-05 ENCOUNTER — Other Ambulatory Visit: Payer: Self-pay

## 2023-06-05 VITALS — BP 136/60 | HR 60 | Wt 116.2 lb

## 2023-06-05 DIAGNOSIS — I2510 Atherosclerotic heart disease of native coronary artery without angina pectoris: Secondary | ICD-10-CM | POA: Diagnosis not present

## 2023-06-05 DIAGNOSIS — I340 Nonrheumatic mitral (valve) insufficiency: Secondary | ICD-10-CM

## 2023-06-05 DIAGNOSIS — R0989 Other specified symptoms and signs involving the circulatory and respiratory systems: Secondary | ICD-10-CM | POA: Insufficient documentation

## 2023-06-05 DIAGNOSIS — Z952 Presence of prosthetic heart valve: Secondary | ICD-10-CM | POA: Insufficient documentation

## 2023-06-05 DIAGNOSIS — I739 Peripheral vascular disease, unspecified: Secondary | ICD-10-CM | POA: Insufficient documentation

## 2023-06-05 DIAGNOSIS — Z87891 Personal history of nicotine dependence: Secondary | ICD-10-CM | POA: Diagnosis not present

## 2023-06-05 DIAGNOSIS — Z7902 Long term (current) use of antithrombotics/antiplatelets: Secondary | ICD-10-CM | POA: Insufficient documentation

## 2023-06-05 DIAGNOSIS — J449 Chronic obstructive pulmonary disease, unspecified: Secondary | ICD-10-CM | POA: Diagnosis not present

## 2023-06-05 DIAGNOSIS — I5042 Chronic combined systolic (congestive) and diastolic (congestive) heart failure: Secondary | ICD-10-CM | POA: Insufficient documentation

## 2023-06-05 DIAGNOSIS — Z955 Presence of coronary angioplasty implant and graft: Secondary | ICD-10-CM | POA: Insufficient documentation

## 2023-06-05 DIAGNOSIS — Z79899 Other long term (current) drug therapy: Secondary | ICD-10-CM | POA: Diagnosis not present

## 2023-06-05 DIAGNOSIS — I38 Endocarditis, valve unspecified: Secondary | ICD-10-CM | POA: Diagnosis not present

## 2023-06-05 DIAGNOSIS — I5022 Chronic systolic (congestive) heart failure: Secondary | ICD-10-CM

## 2023-06-05 DIAGNOSIS — Z87440 Personal history of urinary (tract) infections: Secondary | ICD-10-CM | POA: Insufficient documentation

## 2023-06-05 DIAGNOSIS — Z85118 Personal history of other malignant neoplasm of bronchus and lung: Secondary | ICD-10-CM | POA: Diagnosis not present

## 2023-06-05 DIAGNOSIS — I359 Nonrheumatic aortic valve disorder, unspecified: Secondary | ICD-10-CM

## 2023-06-05 DIAGNOSIS — E785 Hyperlipidemia, unspecified: Secondary | ICD-10-CM | POA: Insufficient documentation

## 2023-06-05 MED ORDER — ENTRESTO 24-26 MG PO TABS
1.0000 | ORAL_TABLET | Freq: Two times a day (BID) | ORAL | 3 refills | Status: DC
  Filled 2023-06-05: qty 60, 30d supply, fill #0

## 2023-06-05 NOTE — Patient Instructions (Addendum)
 Good to see you today!  RESTART Entresto 24/26 mg Twice daily  Labwork for 10-14 days  CONGRATULATIONS on graduating from the clinic Follow up with general cardiology    TO LEAVE A MESSAGE FOR THE NURSE SELECT OPTION 2, PLEASE LEAVE A MESSAGE INCLUDING: YOUR NAME DATE OF BIRTH CALL BACK NUMBER REASON FOR CALL**this is important as we prioritize the call backs  YOU WILL RECEIVE A CALL BACK THE SAME DAY AS LONG AS YOU CALL BEFORE 4:00 PM At the Advanced [facility] Failure Clinic, you and your health needs are our priority. As part of our continuing mission to provide you with exceptional heart care, we have created designated Provider Care Teams. These Care Teams include your primary Cardiologist (physician) and Advanced Practice Providers (APPs- Physician Assistants and Nurse Practitioners) who all work together to provide you with the care you need, when you need it.   You may see any of the following providers on your designated Care Team at your next follow up: Dr Arvilla Meres Dr Marca Ancona Dr. Dorthula Nettles Dr. Clearnce Hasten Amy Filbert Schilder, NP Robbie Lis, [place] [facility] [place], [place] Brynda Peon, NP [place] Lee, NP Clarisa Kindred, NP Karle Plumber, PharmD Enos Fling, PharmD   Please be sure to bring in all your medications bottles to every appointment.    Thank you for choosing [facility]

## 2023-06-05 NOTE — Telephone Encounter (Signed)
 Called to confirm/remind patient of their appointment at the Advanced [facility] Clinic on 06/05/23***.   Appointment:   [x] Confirmed  [] Left mess   [] No answer/No voice mail  [] Phone not in service  Patient reminded to bring all medications and/or complete list.  Confirmed patient has transportation. Gave directions, instructed to utilize valet parking.

## 2023-06-05 NOTE — Progress Notes (Signed)
 PCP: Lorenda Ishihara, MD Primary cardiologist: Dr. Clifton James  HF Cardiologist: Dr Gala Romney   HPI: Ms Rayson is a 73 y.o. woman year old with history of COPD, lung CA s/p RUL resection 2014, CAD (prior stenting of the LM, LAD and circumflex in the setting of dissection), AS s/p TAVR 6/20 and systolic HF with recovered EF.    1 yr TAVR echo: Echo 7/21 EF 40-45%, normally functioning TAVR with a mean gradient of 14 mmHg and trivial-mild PVL. 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 Mild MR.  Seen in 5/22 for unscheduled visit due to CP. Had mild volume overload and given additional lasix.   Echo  04/21/21 EF 50-55% apical aneurysm.   S/p infrarenal abdominal aortic endarterectomy with aortobifemoral bypass & bilateral common femoral artery endarterectomies by Dr. Lenell Antu on 09/20/2021  Echo 9/24 EF 45-50% TAVR valve ok   S/p stenting of superior mesenteric artery 3/25.  Today she returns for post hospital HF follow up. Overall feeling great! She can now eat. No DOE, left ankle is swelling. Denies palpitations, abnormal bleeding, CP, dizziness,or PND/Orthopnea. Appetite ok. No fever or chills. Weight at home 114 pounds. Taking all medications. Takes Lasix MWF. Has had 6 UTIs recently, so we have stopped her Jardiance.  Cardiac Studies: - Echo 9/24 EF 45-50% TAVR valve ok    - Echo 04/21/21 EF 50-55% apical aneurysm.   - Echo 09/24/18 EF 30-35%. TAVR ok. Mild MR.   - Echo 08/2018 EF 25% , severe aortic stenosis, severe mitral regurgitation.    - LHC 08/19/2018 1. Triple vessel CAD.  2. Patent stents in the left main, LAD and Circumflex (placed at the time of dissection) 3. Chronic occlusion small Obtuse marginal branch which fills from right to left collaterals 4. Patent mid to distal RCA stent. The proximal RCA has a moderate non-obstructive stenosis which does not appear to be flow limiting. This is unchanged from her last cath.  5. Severe aortic stenosis  (mean gradient 21 mmHg, peak to peak gradient 36 mmHg, AVA 0.71 cm2) 6. Elevated filling pressures c/w acute volume overload (RA 9/14,   ROS: All systems negative except as listed in HPI, PMH and Problem List.  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  Tobacco Use   Smoking status: Former    Current packs/day: 0.00    Average packs/day: 0.5 packs/day for 40.0 years (20.0 ttl pk-yrs)    Types: Cigarettes    Start date: 10/08/1972    Quit date: 10/08/2012    Years since quitting: 10.6   Smokeless tobacco: Never  Vaping Use   Vaping status: Never Used  Substance and Sexual Activity   Alcohol use: No   Drug use: No   Sexual activity: Yes  Other Topics Concern   Not on file  Social History Narrative   Not on file   Social Drivers of Health   Financial Resource Strain: Low Risk  (10/07/2018)   Overall Financial Resource Strain (CARDIA)    Difficulty of Paying Living Expenses: Not hard at all  Food Insecurity: No Food Insecurity (05/11/2023)   Hunger Vital Sign    Worried About Running Out of Food in the Last Year: Never true    Ran Out  of Food in the Last Year: Never true  Transportation Needs: No Transportation Needs (05/11/2023)   PRAPARE - Administrator, Civil Service (Medical): No    Lack of Transportation (Non-Medical): No  Physical Activity: Insufficiently Active (10/07/2018)   Exercise Vital Sign    Days of Exercise per Week: 4 days    Minutes of Exercise per Session: 30 min  Stress: No Stress Concern Present (10/07/2018)   [employer] of Occupational Health - Occupational Stress Questionnaire    Feeling of Stress : Only a little  Social Connections: Socially Integrated (05/11/2023)   Social Connection and Isolation Panel [NHANES]    Frequency of  Communication with Friends and Family: More than three times a week    Frequency of Social Gatherings with Friends and Family: Twice a week    Attends Religious Services: 1 to 4 times per year    Active Member of [employer] or Organizations: No    Attends Engineer, structural: 1 to 4 times per year    Marital Status: Married  Catering manager Violence: Not At Risk (05/11/2023)   Humiliation, Afraid, Rape, and Kick questionnaire    Fear of Current or Ex-Partner: No    Emotionally Abused: No    Physically Abused: No    Sexually Abused: No   FH:  Family History  Problem Relation Age of Onset   Alcohol abuse Father    Lupus Mother    Other Sister        Degenerative disc disease   Migraines Sister    Pulmonary fibrosis Brother    Heart attack Neg Hx    Past Medical History:  Diagnosis Date   AAA (abdominal aortic aneurysm) (HCC)    a. small by cath 2014.   Acute on chronic combined systolic and diastolic heart failure (HCC) 08/19/2018   Arthritis    "across my hips; buttocks; comes w/the weather" (02/27/2013)   CAD (coronary artery disease), native coronary artery    a. 10/2012 - rotational atherectomy of LAD c/b dissection s/p PCI to LAD, LCx, LM. b. 12/2012 - s/p overlapping DES to RCA.   Cancer [facility])    Cataract    "just the beginnings on the right" (02/27/2013)   Chronic combined systolic and diastolic CHF (congestive heart failure) (HCC)    COPD (chronic obstructive pulmonary disease) (HCC)    Coronary artery disease    GERD (gastroesophageal reflux disease)    Hypercholesterolemia    Ischemic cardiomyopathy    a. EF 45-50%.   Mass of lung    "small tumor RUL; they are watching it" (02/27/2013)   Mitral regurgitation    Peripheral vascular disease, unspecified (HCC)    Pneumonia    S/P TAVR (transcatheter aortic valve replacement)    23 mm Edwards Sapien 3 transcatheter heart valve placed via open left transaxillary approach    Severe aortic stenosis    Severe  mitral regurgitation    Tachycardia, unspecified    Tobacco abuse     Current Outpatient Medications  Medication Sig Dispense Refill   acetaminophen (TYLENOL) 500 MG tablet Take 1,000 mg by mouth every 6 (six) hours as needed for moderate pain.     albuterol (PROVENTIL) (2.5 MG/3ML) 0.083% nebulizer solution Take 3 mLs by nebulization every 6 (six) hours as needed for wheezing or shortness of breath. 360 mL 3   albuterol (VENTOLIN HFA) 108 (90 Base) MCG/ACT inhaler Inhale 2 puffs into the lungs every 6 (six) hours as needed for  wheezing or shortness of breath.     alendronate (FOSAMAX) 70 MG tablet Take 70 mg by mouth once a week. Take with a full glass of water on an empty stomach on Sunday mornings     alendronate (FOSAMAX) 70 MG tablet Take 1 tablet (70 mg total) by mouth once a week 30 minutes before first food or beverage or medicine of the day with water. 4 tablet 11   Alirocumab (PRALUENT) 150 MG/ML SOAJ Inject 1 mL (150 mg total) into the skin every 14 (fourteen) days. 6 mL 1   BISACODYL 5 MG EC tablet Take 5 mg by mouth as directed. 1/2 TABLET DAILY     bisoprolol (ZEBETA) 5 MG tablet Take 0.5 tablets (2.5 mg total) by mouth daily. 45 tablet 0   Calcium Carb-Cholecalciferol (CALCIUM 600 + D PO) Take 1 tablet by mouth daily.     cholecalciferol (VITAMIN D3) 25 MCG (1000 UNIT) tablet Take 1,000 Units by mouth daily.     clindamycin (CLEOCIN) 150 MG capsule Take 600 mg by mouth See admin instructions. Take 600 mg by mouth 1 hour prior to dental appointment     clopidogrel (PLAVIX) 75 MG tablet Take 1 tablet (75 mg total) by mouth daily. 90 tablet 3   famotidine (ZANTAC 360) 10 MG tablet Take 10 mg by mouth daily as needed for heartburn or indigestion.     Fluticasone-Umeclidin-Vilant (TRELEGY ELLIPTA) 100-62.5-25 MCG/ACT AEPB Inhale 1 puff into the lungs daily. 60 each 11   Fluticasone-Umeclidin-Vilant (TRELEGY ELLIPTA) 100-62.5-25 MCG/INH AEPB Inhale 1 puff into the lungs daily. 60 each  11   furosemide (LASIX) 40 MG tablet TAKE 1 TABLET BY MOUTH AS NEEDED FOR FLUID OR EDMA 90 tablet 0   Hypromellose 0.2 % SOLN Place 2 drops into both eyes 4 (four) times daily as needed (dry eyes).      lansoprazole (PREVACID) 30 MG capsule Take 30 mg by mouth in the morning.     lansoprazole (PREVACID) 30 MG capsule Take 1 capsule (30 mg total) by mouth daily before a meal. 90 capsule 3   lansoprazole (PREVACID) 30 MG capsule Take 1 capsule (30 mg total) by mouth daily before a meal 90 capsule 1   NASAL SALINE NA Place 1 spray into the nose every 4 (four) hours as needed (congestion).     nitroGLYCERIN (NITROSTAT) 0.4 MG SL tablet DISSOLVE ONE TABLET UNDER TONGUE AS NEEDED FOR CHEST PAIN EVERY 5 MINUTES 25 tablet 4   nystatin cream (MYCOSTATIN) Apply 1 Application topically. As needed FINISHING 06/01/23     rosuvastatin (CRESTOR) 5 MG tablet Take 1 tablet (5 mg total) by mouth 3 (three) times a week. 90 tablet 3   rosuvastatin (CRESTOR) 5 MG tablet Take 1 tablet (5 mg total) by mouth 3 (three) times a week. 45 tablet 3   simethicone (MYLICON) 80 MG chewable tablet Chew 2 tablets (160 mg total) by mouth every 6 (six) hours as needed for flatulence. 30 tablet 0   Spacer/Aero-Holding Chambers (AEROCHAMBER MV) inhaler Use as instructed 1 each 0   spironolactone (ALDACTONE) 25 MG tablet Take 0.5 tablets (12.5 mg total) by mouth daily. 45 tablet 3   No current facility-administered medications for this encounter.   BP 136/60   Pulse 60   Wt 52.7 kg (116 lb 3.2 oz)   LMP 10/04/1999   SpO2 99%   BMI 23.47 kg/m   Wt Readings from Last 3 Encounters:  06/05/23 52.7 kg (116 lb 3.2 oz)  05/30/23 52.5 kg (115 lb 12.8 oz)  05/10/23 51 kg (112 lb 7 oz)   PHYSICAL EXAM: General:  NAD. No resp difficulty, walked into clinic HEENT: Normal Neck: Supple. No JVD. Cor: Regular rate & rhythm. No rubs, gallops or murmurs. Lungs: Clear Abdomen: Soft, nontender, nondistended.  Extremities: No cyanosis,  clubbing, rash, edema Neuro: Alert & oriented x 3, moves all 4 extremities w/o difficulty. Affect pleasant.  ECG (personally reviewed): SB 56 bpm  ASSESSMENT & PLAN:  1. Chronic Systolic HF. ICM + valvular heart disease - previous ECHO with EF 40-45%.  - Echo (6/20): EF 20-25% with severe MR - Echo (1/21): EF 50% Apex hypokinetic - Echo (7/21): EF 40-45%, normal TAVR mean gradient 14 mmHg and trivial-mild PVL. 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 Mild MR. - Echo (2/23): EF 50-55% apical aneurysm. TAVR valve (Sapien) ok - Echo 7/23 EF 55-60% TAVR ok  - Echo 9/24 EF 45-50% TAVR valve ok  - NYHA II. Volume status ok   - Restart Entresto 24.26 mg bid - Continue Lasix 40 mg MWF - Continue spironolactone 12.5 mg daily.  - Continue bisoprolol 2.5 mg daily.  - Off Jardiance with frequent UTIs. - Labs today, repeat BMET in 2 weeks  2. Severe AS - S/P TAVR on 08/26/2018  - Continue Plavix.  - Stable on recent echo - Reminded of need for SBE prophylaxis   3. Severe MR --> Mild MR - Functional. - Improved with TAVR and treatment of HF  4. CAD - Patent stents on LHC 6/20  - No s/s ischemia - Follows with [facility]   5. COPD  - Quit smoking 2014. - Followed by Dr. Sherene Sires   6. H/O Lung Cancer  - s/p resection 2014.  7. Carotid bruits - Radiated from AoV - Carotid US 6/20 1-39% bilaterally - Follows with [facility]  8. Hyperlipidemia - Continue Crestor 5 mg 3x/week - LDL 33 (05/2023) - Per PCP  9. PAD  - Followed by Dr. Lenell Antu - now s/p aortobifemoral bypass  She has graduate from AHF clinic, per Dr. Leory Plowman. Can continue care with [facility]/Dr. Clifton James.  Jacklynn Ganong, FNP  1:49 PM

## 2023-06-06 ENCOUNTER — Other Ambulatory Visit: Payer: Self-pay

## 2023-06-07 ENCOUNTER — Other Ambulatory Visit: Payer: Self-pay

## 2023-06-07 ENCOUNTER — Other Ambulatory Visit (HOSPITAL_COMMUNITY): Payer: Self-pay | Admitting: Cardiology

## 2023-06-07 ENCOUNTER — Other Ambulatory Visit (HOSPITAL_COMMUNITY): Payer: Self-pay

## 2023-06-07 ENCOUNTER — Ambulatory Visit (HOSPITAL_COMMUNITY)
Admission: RE | Admit: 2023-06-07 | Discharge: 2023-06-07 | Disposition: A | Discharge status: Home / Self Care | Source: Ambulatory Visit | Attending: Vascular Surgery | Admitting: Vascular Surgery

## 2023-06-07 DIAGNOSIS — K551 Chronic vascular disorders of intestine: Secondary | ICD-10-CM | POA: Diagnosis not present

## 2023-06-07 MED ORDER — ENTRESTO 24-26 MG PO TABS
1.0000 | ORAL_TABLET | Freq: Two times a day (BID) | ORAL | 3 refills | Status: DC
  Filled 2023-06-07 (×2): qty 180, 90d supply, fill #0

## 2023-06-07 MED ORDER — ENTRESTO 24-26 MG PO TABS: 1.0000 | ORAL_TABLET | Freq: Two times a day (BID) | ORAL | 3 refills | Status: DC

## 2023-06-07 NOTE — Addendum Note (Signed)
 Addended by: Amyriah Buras, Milagros Reap on: 06/07/2023 11:02 AM   Modules accepted: Orders

## 2023-06-10 ENCOUNTER — Ambulatory Visit (HOSPITAL_COMMUNITY)
Admission: RE | Admit: 2023-06-10 | Discharge: 2023-06-10 | Disposition: A | Discharge status: Home / Self Care | Source: Ambulatory Visit | Attending: Cardiology | Admitting: Cardiology

## 2023-06-10 ENCOUNTER — Telehealth (HOSPITAL_COMMUNITY): Payer: Self-pay

## 2023-06-10 DIAGNOSIS — I5022 Chronic systolic (congestive) heart failure: Secondary | ICD-10-CM

## 2023-06-10 LAB — BASIC METABOLIC PANEL WITH GFR
Anion gap: 6 (ref 5–15)
BUN: 18 mg/dL (ref 8–23)
CO2: 26 mmol/L (ref 22–32)
Calcium: 8.8 mg/dL — ABNORMAL LOW (ref 8.9–10.3)
Chloride: 109 mmol/L (ref 98–111)
Creatinine, Ser: 1.12 mg/dL — ABNORMAL HIGH (ref 0.44–1.00)
GFR, Estimated: 53 mL/min — ABNORMAL LOW (ref >60)
Glucose, Bld: 84 mg/dL (ref 70–99)
Potassium: 5.5 mmol/L — ABNORMAL HIGH (ref 3.5–5.1)
Sodium: 141 mmol/L (ref 135–145)

## 2023-06-10 LAB — BRAIN NATRIURETIC PEPTIDE: B Natriuretic Peptide: 250.4 pg/mL — ABNORMAL HIGH (ref 0.0–100.0)

## 2023-06-10 NOTE — Telephone Encounter (Signed)
-----   Message from Jacklynn Ganong sent at 06/10/2023 12:30 PM EDT ----- K is elevated. Please stop entresto and hold spiro x 3 days, then resume spiro at home dose of 12.5 mg daily. Repeat BMET on friday

## 2023-06-10 NOTE — Telephone Encounter (Signed)
 Spoke with patient regarding the following results. Patient made aware and patient verbalized understanding.   Medication list updated. Patient aware of all medication instructions and verbalized understanding. Repeat labs ordered and scheduled. Patient aware of appointment time and date.   Advised patient to call back to office with any issues, questions, or concerns. Patient verbalized understanding.

## 2023-06-11 ENCOUNTER — Encounter: Payer: Self-pay | Admitting: Vascular Surgery

## 2023-06-11 ENCOUNTER — Ambulatory Visit (INDEPENDENT_AMBULATORY_CARE_PROVIDER_SITE_OTHER): Admitting: Vascular Surgery

## 2023-06-11 VITALS — BP 116/59 | HR 59 | Temp 97.7°F | Ht 59.0 in | Wt 116.0 lb

## 2023-06-11 DIAGNOSIS — I739 Peripheral vascular disease, unspecified: Secondary | ICD-10-CM

## 2023-06-11 NOTE — Progress Notes (Signed)
 [facility] [place]  ASSESSMENT / PLAN: Teresa [name] is a 73 y.o. female status post aorto-bi-femoral bypass (14 x 8mm Dacron) on 09/20/21. Status post SMA angioplasty and stenting 05/10/23 for chronic mesenteric ischemia  Recommend:  Complete cessation from all tobacco products. Blood glucose control with goal A1c < 7%. Blood pressure control with goal blood pressure < 140/90 mmHg. Lipid reduction therapy with goal LDL-C <100 mg/dL (<16 if symptomatic from PAD).  Clopidogrel 75mg  PO QD. Antilipid therapy per cardiology service.  Daily walking to and past the point of discomfort. Patient counseled to keep a log of exercise distance.  Complete symptom relief with SMA stenting. I will see her again in the end of May for surveillance of PAD, then we will combine mesenteric and PAD surveillance.   CHIEF COMPLAINT: Left leg cramping with walking  HISTORY OF PRESENT ILLNESS: Teresa [name] is a 73 y.o. female with extensive past vascular history (multiple PCI's, TAVR, congestive heart failure, etc.) who presents to clinic for evaluation of cramping discomfort in her left calf with walking.  The patient reports she can walk about the distance of 2 storefront's before severe cramping pain in her calf begins to bother her.  She has to rest for the pain to be relieved.  She has no pain at rest.  She denies any ulcers about her feet.  08/01/21: Patient returns to clinic for reevaluation.  She reports she can get about 50 yards before she has severe cramping in her left thigh and calf.  This is interfering with her ability to care for children and take care of her IADLs.  We had a long discussion about risks, benefits, and alternatives to intervention for claudication.  She desires intervention.  09/12/21: Patient returns after angiogram, CT scan, and cardiology evaluation.  She is ready to proceed with surgery.  We again reviewed the risks, benefits, and alternatives.  We again  clarified her symptomology.  She reports short distance buttock and thigh claudication which is relieved by rest.  She does not report symptoms are relieved by positional change.  She does not have symptoms when standing for long period of time.  I do think her symptoms are related to peripheral arterial disease.  03/06/22: In the clinic to evaluate for a "knot" in her abdomen.  Patient reports a lump in her left lower quadrant which is more noticeable when she stands.  It is somewhat tender.  It mostly bothers her at night when she is resting.  She does not report any obstructive symptoms.  04/03/22: patient returns to clinic to discuss CT scan. Ventral hernia confirmed. She has seen Dr. Sheliah Hatch and is planning on fixing this with open ventral hernia repair. Incidental is proximal anastomotic stenosis. Patient reports no claudication, rest pain, or ischemic ulceration.  06/19/22: Doing very well overall.  Reports no symptoms in her lower extremities.  Can walk is much as she likes.  Plans to undergo ventral hernia repair with Dr. Sheliah Hatch in the near future  05/09/23 (with PA Rhyne): The pt returns today for follow up.  Pt was seen by Dr. Milas Kocher on 05/07/2023 and at that time, she was c/o post prandial abdominal pain.  She was urgently referred for evaluation.     She states she has been having abdominal pain after eating since August but has progressively been getting worse.  She states that it starts about 15-30 minutes after she eats and lasts until she is able to have a  BM.  She does have some fear of eating due to the pain and has had about a 5 lb weight loss.   06/11/23: patient returns after mesenteric intervention for CMI. She reports symptom relief. No problems with eating. She is gaining weight back. We reviewed her duplex in detail.   VASCULAR SURGICAL HISTORY: none  VASCULAR RISK FACTORS: Negative history of stroke / transient ischemic attack. Positive history of coronary artery disease. +  history of PCI.  Negative history of CABG.  Negative history of diabetes mellitus.  No recent A1c available to me. Positive history of smoking.  Not actively smoking. Positive history of hypertension.  Negative history of chronic kidney disease.   Positive history of chronic obstructive pulmonary disease. Not on oxygen.  FUNCTIONAL STATUS: ECOG performance status: (1) Restricted in physically strenuous activity, ambulatory and able to do work of light nature Ambulatory status: Ambulatory within the community with limits  Past Medical History:  Diagnosis Date   AAA (abdominal aortic aneurysm) (HCC)    a. small by cath 2014.   Acute on chronic combined systolic and diastolic heart failure (HCC) 08/19/2018   Arthritis    "across my hips; buttocks; comes w/the weather" (02/27/2013)   CAD (coronary artery disease), native coronary artery    a. 10/2012 - rotational atherectomy of LAD c/b dissection s/p PCI to LAD, LCx, LM. b. 12/2012 - s/p overlapping DES to RCA.   Cancer [facility])    Cataract    "just the beginnings on the right" (02/27/2013)   Chronic combined systolic and diastolic CHF (congestive heart failure) (HCC)    COPD (chronic obstructive pulmonary disease) (HCC)    Coronary artery disease    GERD (gastroesophageal reflux disease)    Hypercholesterolemia    Ischemic cardiomyopathy    a. EF 45-50%.   Mass of lung    "small tumor RUL; they are watching it" (02/27/2013)   Mitral regurgitation    Peripheral vascular disease, unspecified (HCC)    Pneumonia    S/P TAVR (transcatheter aortic valve replacement)    23 mm Edwards Sapien 3 transcatheter heart valve placed via open left transaxillary approach    Severe aortic stenosis    Severe mitral regurgitation    Tachycardia, unspecified    Tobacco abuse     Past Surgical History:  Procedure Laterality Date   ABDOMINAL AORTOGRAM W/LOWER EXTREMITY N/A 08/03/2021   Procedure: ABDOMINAL AORTOGRAM W/LOWER EXTREMITY;  Surgeon: Cephus Shelling, MD;  Location: [facility] IN[facility];  Service: Cardiovascular;  Laterality: N/A;   ABDOMINAL AORTOGRAM W/LOWER EXTREMITY N/A 04/13/2022   Procedure: ABDOMINAL AORTOGRAM W/LOWER EXTREMITY;  Surgeon: Leonie Douglas, MD;  Location: [facility];  Service: Cardiovascular;  Laterality: N/A;   AORTA - BILATERAL FEMORAL ARTERY BYPASS GRAFT Bilateral 09/20/2021   Procedure: AORTA BIFEMORAL BYPASS GRAFT WITH EPIDURAL CATHETER and  aortic endartarectomey and bilateral femoral endartarectomies.;  Surgeon: Leonie Douglas, MD;  Location: [facility] OR;  Service: Vascular;  Laterality: Bilateral;   CARDIAC CATHETERIZATION  08/2018   CORONARY ANGIOPLASTY WITH STENT PLACEMENT  10/2012; 12/08/2012   "7 + 3" (02/27/2013)   ESOPHAGOGASTRODUODENOSCOPY (EGD) WITH PROPOFOL N/A 01/11/2015   Procedure: ESOPHAGOGASTRODUODENOSCOPY (EGD) WITH PROPOFOL;  Surgeon: Charolett Bumpers, MD;  Location: [facility] ENDOSCOPY;  Service: Endoscopy;  Laterality: N/A;   FLEXIBLE BRONCHOSCOPY N/A 03/02/2013   Procedure: FLEXIBLE BRONCHOSCOPY;  Surgeon: Alleen Borne, MD;  Location: [facility] OR;  Service: Thoracic;  Laterality: N/A;   FLEXIBLE SIGMOIDOSCOPY N/A 01/11/2015  Procedure: FLEXIBLE SIGMOIDOSCOPY;  Surgeon: Charolett Bumpers, MD;  Location: [facility] ENDOSCOPY;  Service: Endoscopy;  Laterality: N/A;  unable to complete colon-prep issues   GANGLION CYST EXCISION Left 1975   "wrist"   INCISIONAL HERNIA REPAIR N/A 08/30/2022   Procedure: INCISIONAL HERNIA REPAIR WITH MESH;  Surgeon: Kinsinger, [name], MD;  Location: [facility] ORS;  Service: General;  Laterality: N/A;   LEFT HEART CATH AND CORONARY ANGIOGRAPHY N/A 06/29/2016   Procedure: Left Heart Cath and Coronary Angiography;  Surgeon: Corky Crafts, MD;  Location: [facility] INVASIVE CV LAB;  Service: Cardiovascular;  Laterality: N/A;   LEFT HEART CATH AND CORONARY ANGIOGRAPHY N/A 08/19/2018   Procedure: LEFT HEART CATH AND CORONARY ANGIOGRAPHY;  Surgeon: Kathleene Hazel, MD;  Location:  [facility];  Service: Cardiovascular;  Laterality: N/A;   LEFT HEART CATHETERIZATION WITH CORONARY ANGIOGRAM N/A 12/08/2012   Procedure: LEFT HEART CATHETERIZATION WITH CORONARY ANGIOGRAM;  Surgeon: Corky Crafts, MD;  Location: [facility] CATH LAB;  Service: Cardiovascular;  Laterality: N/A;   MOUTH SURGERY  2010?   "for bone loss" (06/24/2012)   PERCUTANEOUS CORONARY STENT INTERVENTION (PCI-S) N/A 10/09/2012   Procedure: PERCUTANEOUS CORONARY STENT INTERVENTION (PCI-S);  Surgeon: Corky Crafts, MD;  Location: [facility] CATH LAB;  Service: Cardiovascular;  Laterality: N/A;   RIGHT HEART CATH N/A 08/19/2018   Procedure: RIGHT HEART CATH;  Surgeon: Kathleene Hazel, MD;  Location: [facility];  Service: Cardiovascular;  Laterality: N/A;   TEE WITHOUT CARDIOVERSION N/A 08/22/2018   Procedure: TRANSESOPHAGEAL ECHOCARDIOGRAM (TEE);  Surgeon: Chrystie Nose, MD;  Location: [facility] ENDOSCOPY;  Service: Cardiovascular;  Laterality: N/A;   TEE WITHOUT CARDIOVERSION N/A 08/26/2018   Procedure: TRANSESOPHAGEAL ECHOCARDIOGRAM (TEE);  Surgeon: Kathleene Hazel, MD;  Location: [facility] OR;  Service: Open Heart Surgery;  Laterality: N/A;   THORACOTOMY/LOBECTOMY Right 03/02/2013   Procedure: Right Video Assisted Thoracoscopy/Thoracotomy with upper Lobectomy;  Surgeon: Alleen Borne, MD;  Location: [facility] OR;  Service: Thoracic;  Laterality: Right;  Right Lung Upper  Lobectomy    TONSILLECTOMY  1960   VIDEO BRONCHOSCOPY  09/12/2011   Procedure: VIDEO BRONCHOSCOPY WITH FLUORO;  Surgeon: Nyoka Cowden, MD;  Location: [facility] ENDOSCOPY;  Service: Cardiopulmonary;  Laterality: Bilateral;   VISCERAL ANGIOGRAPHY N/A 05/10/2023   Procedure: VISCERAL ANGIOGRAPHY;  Surgeon: Leonie Douglas, MD;  Location: [facility];  Service: Cardiovascular;  Laterality: N/A;   VISCERAL ARTERY INTERVENTION N/A 05/10/2023   Procedure: VISCERAL ARTERY INTERVENTION;  Surgeon: Leonie Douglas, MD;  Location: [facility] INVAS[facility] CV LAB;   Service: Cardiovascular;  Laterality: N/A;  SMA    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  Tobacco Use   Smoking status: Former    Current packs/day: 0.00    Average packs/day: 0.5 packs/day for 40.0 years (20.0 ttl pk-yrs)    Types: Cigarettes    Start date: 10/08/1972    Quit date: 10/08/2012  Years since quitting: 10.6   Smokeless tobacco: Never  Vaping Use   Vaping status: Never Used  Substance and Sexual Activity   Alcohol use: No   Drug use: No   Sexual activity: Yes  Other Topics Concern   Not on file  Social History Narrative   Not on file   Social Drivers of Health   Financial Resource Strain: Low Risk  (10/07/2018)   Overall Financial Resource Strain (CARDIA)    Difficulty of Paying Living Expenses: Not hard at all  Food Insecurity: No Food Insecurity (05/11/2023)   Hunger Vital Sign    Worried About Running Out of Food in the Last Year: Never true    Ran Out of Food in the Last Year: Never true  Transportation Needs: No Transportation Needs (05/11/2023)   PRAPARE - Administrator, Civil Service (Medical): No    Lack of Transportation (Non-Medical): No  Physical Activity: Insufficiently Active (10/07/2018)   Exercise Vital Sign    Days of Exercise per Week: 4 days    Minutes of Exercise per Session: 30 min  Stress: No Stress Concern Present (10/07/2018)   [employer] of Occupational Health - Occupational Stress Questionnaire    Feeling of Stress : Only a little  Social Connections: Socially Integrated (05/11/2023)   Social  Connection and Isolation Panel [NHANES]    Frequency of Communication with Friends and Family: More than three times a week    Frequency of Social Gatherings with Friends and Family: Twice a week    Attends Religious Services: 1 to 4 times per year    Active Member of [employer] or Organizations: No    Attends Engineer, structural: 1 to 4 times per year    Marital Status: Married  Catering manager Violence: Not At Risk (05/11/2023)   Humiliation, Afraid, Rape, and Kick questionnaire    Fear of Current or Ex-Partner: No    Emotionally Abused: No    Physically Abused: No    Sexually Abused: No    Allergies  Allergen Reactions   Azithromycin Shortness Of Breath    Other Reaction(s): burning rash/body   Cefaclor Rash and Shortness Of Breath    Other Reaction(s): breathing difficulty   Penicillins Other (See Comments)    Unknown from childhood Did it involve swelling of the face/tongue/throat, SOB, or low BP? No Did it involve sudden or severe rash/hives, skin peeling, or any reaction on the inside of your mouth or nose? No Did you need to seek medical attention at a hospital or doctor's office? Yes When did it last happen? Infancy  If all above answers are "NO", may proceed with cephalosporin use.    Septra [Sulfamethoxazole-Trimethoprim] Shortness Of Breath and Rash   Doxycycline Swelling and Other (See Comments)    Redness on the face   Lipitor [Atorvastatin] Other (See Comments)    Severe muscle aches   Morphine And Codeine Nausea And Vomiting and Other (See Comments)    Terrible headache   Zetia [Ezetimibe] Other (See Comments)    Severe stomach pain   Zocor [Simvastatin] Other (See Comments)    Severe muscle aches   Adhesive [Tape] Other (See Comments)    Redness and swelling - use paper tape   Vicodin [Hydrocodone-Acetaminophen] Nausea And Vomiting    Current Outpatient Medications  Medication Sig Dispense Refill   acetaminophen (TYLENOL) 500 MG tablet Take 1,000 mg  by mouth every 6 (six) hours as needed for moderate pain.  albuterol (PROVENTIL) (2.5 MG/3ML) 0.083% nebulizer solution Take 3 mLs by nebulization every 6 (six) hours as needed for wheezing or shortness of breath. 360 mL 3   albuterol (VENTOLIN HFA) 108 (90 Base) MCG/ACT inhaler Inhale 2 puffs into the lungs every 6 (six) hours as needed for wheezing or shortness of breath.     alendronate (FOSAMAX) 70 MG tablet Take 70 mg by mouth once a week. Take with a full glass of water on an empty stomach on Sunday mornings     alendronate (FOSAMAX) 70 MG tablet Take 1 tablet (70 mg total) by mouth once a week 30 minutes before first food or beverage or medicine of the day with water. 4 tablet 11   Alirocumab (PRALUENT) 150 MG/ML SOAJ Inject 1 mL (150 mg total) into the skin every 14 (fourteen) days. 6 mL 1   bisoprolol (ZEBETA) 5 MG tablet Take 0.5 tablets (2.5 mg total) by mouth daily. 45 tablet 0   Calcium Carb-Cholecalciferol (CALCIUM 600 + D PO) Take 1 tablet by mouth daily.     cholecalciferol (VITAMIN D3) 25 MCG (1000 UNIT) tablet Take 1,000 Units by mouth daily.     clindamycin (CLEOCIN) 150 MG capsule Take 600 mg by mouth See admin instructions. Take 600 mg by mouth 1 hour prior to dental appointment     clopidogrel (PLAVIX) 75 MG tablet Take 1 tablet (75 mg total) by mouth daily. 90 tablet 3   famotidine (ZANTAC 360) 10 MG tablet Take 10 mg by mouth daily as needed for heartburn or indigestion.     Fluticasone-Umeclidin-Vilant (TRELEGY ELLIPTA) 100-62.5-25 MCG/ACT AEPB Inhale 1 puff into the lungs daily. 60 each 11   Fluticasone-Umeclidin-Vilant (TRELEGY ELLIPTA) 100-62.5-25 MCG/INH AEPB Inhale 1 puff into the lungs daily. 60 each 11   furosemide (LASIX) 40 MG tablet TAKE 1 TABLET BY MOUTH AS NEEDED FOR FLUID OR EDMA 90 tablet 0   Hypromellose 0.2 % SOLN Place 2 drops into both eyes 4 (four) times daily as needed (dry eyes).      lansoprazole (PREVACID) 30 MG capsule Take 30 mg by mouth in the  morning.     lansoprazole (PREVACID) 30 MG capsule Take 1 capsule (30 mg total) by mouth daily before a meal. 90 capsule 3   lansoprazole (PREVACID) 30 MG capsule Take 1 capsule (30 mg total) by mouth daily before a meal 90 capsule 1   NASAL SALINE NA Place 1 spray into the nose every 4 (four) hours as needed (congestion).     nitroGLYCERIN (NITROSTAT) 0.4 MG SL tablet DISSOLVE ONE TABLET UNDER TONGUE AS NEEDED FOR CHEST PAIN EVERY 5 MINUTES 25 tablet 4   rosuvastatin (CRESTOR) 5 MG tablet Take 1 tablet (5 mg total) by mouth 3 (three) times a week. 90 tablet 3   rosuvastatin (CRESTOR) 5 MG tablet Take 1 tablet (5 mg total) by mouth 3 (three) times a week. 45 tablet 3   simethicone (MYLICON) 80 MG chewable tablet Chew 2 tablets (160 mg total) by mouth every 6 (six) hours as needed for flatulence. 30 tablet 0   Spacer/Aero-Holding Chambers (AEROCHAMBER MV) inhaler Use as instructed 1 each 0   spironolactone (ALDACTONE) 25 MG tablet Take 0.5 tablets (12.5 mg total) by mouth daily. 45 tablet 3   No current facility-administered medications for this visit.    PHYSICAL EXAM Vitals:   06/11/23 1039  BP: (!) 116/59  Pulse: (!) 59  Temp: 97.7 F (36.5 C)  SpO2: 98%  Weight: 116 lb (  52.6 kg)  Height: 4\' 11"  (1.499 m)    Well-appearing woman in no acute distress Regular rate and rhythm Unlabored breathing Warm well-perfused feet  PERTINENT LABORATORY AND RADIOLOGIC DATA  Most recent CBC    Latest Ref Rng & Units 05/11/2023    2:19 PM 05/11/2023    7:34 AM 05/10/2023   11:53 PM  CBC  WBC 4.0 - 10.5 K/uL 9.4  8.6  9.6   Hemoglobin 12.0 - 15.0 g/dL 16.1  09.6  04.5   Hematocrit 36.0 - 46.0 % 29.9  32.3  35.3   Platelets 150 - 400 K/uL 255  229  220      Most recent CMP    Latest Ref Rng & Units 06/10/2023    9:13 AM 05/12/2023    4:57 AM 05/11/2023    7:34 AM  CMP  Glucose 70 - 99 mg/dL 84  91  409   BUN 8 - 23 mg/dL 18  18  24    Creatinine 0.44 - 1.00 mg/dL 8.11  9.14  7.82   Sodium  135 - 145 mmol/L 141  137  136   Potassium 3.5 - 5.1 mmol/L 5.5  3.8  4.1   Chloride 98 - 111 mmol/L 109  112  108   CO2 22 - 32 mmol/L 26  21  19    Calcium 8.9 - 10.3 mg/dL 8.8  7.7  7.8    Hgb N5A MFr Bld (%)  Date Value  08/30/2022 6.1 (H)    LDL Chol Calc (NIH)  Date Value Ref Range Status  09/06/2021 101 (H) 0 - 99 mg/dL Final   LDL Cholesterol  Date Value Ref Range Status  05/10/2023 33 0 - 99 mg/dL Final    Comment:           Total Cholesterol/HDL:CHD Risk Coronary Heart Disease Risk Table                     Men   Women  1/2 Average Risk   3.4   3.3  Average Risk       5.0   4.4  2 X Average Risk   9.6   7.1  3 X Average Risk  23.4   11.0        Use the calculated Patient Ratio above and the CHD Risk Table to determine the patient's CHD Risk.        ATP III CLASSIFICATION (LDL):  <100     mg/dL   Optimal  213-086  mg/dL   Near or Above                    Optimal  130-159  mg/dL   Borderline  578-469  mg/dL   High  >629     mg/dL   Very High Performed at [facility], [address] [place], [place] [address]     Mesenteric Duplex 06/11/23:    Patent SMA stent with no stenosis.   Rande Brunt. Lenell Antu, MD [facility] Phone Number: [phone] 06/11/2023 12:42 PM  Total time spent on preparing this encounter including chart review, data review, collecting history, examining the patient, coordinating care for this established patient, 20 minutes.  Portions of this report may have been transcribed using voice recognition software.  Every effort has been made to ensure accuracy; however, inadvertent computerized transcription errors may still be present.

## 2023-06-13 ENCOUNTER — Other Ambulatory Visit: Payer: Self-pay

## 2023-06-14 ENCOUNTER — Ambulatory Visit (HOSPITAL_COMMUNITY)
Admission: RE | Admit: 2023-06-14 | Discharge: 2023-06-14 | Disposition: A | Discharge status: Home / Self Care | Source: Ambulatory Visit | Attending: Cardiology | Admitting: Cardiology

## 2023-06-14 ENCOUNTER — Other Ambulatory Visit: Payer: Self-pay | Admitting: *Deleted

## 2023-06-14 DIAGNOSIS — I5022 Chronic systolic (congestive) heart failure: Secondary | ICD-10-CM | POA: Diagnosis not present

## 2023-06-14 DIAGNOSIS — I7409 Other arterial embolism and thrombosis of abdominal aorta: Secondary | ICD-10-CM

## 2023-06-14 DIAGNOSIS — K551 Chronic vascular disorders of intestine: Secondary | ICD-10-CM

## 2023-06-14 DIAGNOSIS — I739 Peripheral vascular disease, unspecified: Secondary | ICD-10-CM

## 2023-06-14 LAB — BASIC METABOLIC PANEL WITH GFR
Anion gap: 13 (ref 5–15)
BUN: 31 mg/dL — ABNORMAL HIGH (ref 8–23)
CO2: 26 mmol/L (ref 22–32)
Calcium: 9.6 mg/dL (ref 8.9–10.3)
Chloride: 97 mmol/L — ABNORMAL LOW (ref 98–111)
Creatinine, Ser: 1.06 mg/dL — ABNORMAL HIGH (ref 0.44–1.00)
GFR, Estimated: 57 mL/min — ABNORMAL LOW (ref >60)
Glucose, Bld: 90 mg/dL (ref 70–99)
Potassium: 4.4 mmol/L (ref 3.5–5.1)
Sodium: 136 mmol/L (ref 135–145)

## 2023-06-17 ENCOUNTER — Other Ambulatory Visit (HOSPITAL_COMMUNITY)

## 2023-06-18 ENCOUNTER — Ambulatory Visit: Admitting: Physician Assistant

## 2023-06-18 VITALS — BP 140/64 | HR 66 | Temp 97.8°F | Resp 18 | Ht 59.0 in | Wt 115.9 lb

## 2023-06-18 DIAGNOSIS — I739 Peripheral vascular disease, unspecified: Secondary | ICD-10-CM

## 2023-06-18 NOTE — Progress Notes (Signed)
 Office Note   History of Present Illness   Teresa [name] is a 74 y.o. ([birth year]) female who presents as a triage visit.  She has a history of bilateral femoral endarterectomies and aortobifemoral bypass on 09/20/2021 by Dr. Edgardo Goodwill.  This was done for a small infrarenal AAA and disabling bilateral lower extremity claudication.  She also has a history of SMA angioplasty and stenting on 05/10/2023 by Dr. Edgardo Goodwill for chronic mesenteric ischemia.  She returns today as a triage visit.  She states on the night of April 10 she developed a sudden, severe cramp in her left lateral lower calf.  This cramp came and went for a few hours that night and then went away completely.  She did not have to do anything to make it go away.  She has not had any cramps since then.  She denies any lower extremity swelling, rest pain, tissue loss, or claudication.  Current Outpatient Medications  Medication Sig Dispense Refill   acetaminophen  (TYLENOL ) 500 MG tablet Take 1,000 mg by mouth every 6 (six) hours as needed for moderate pain.     albuterol  (PROVENTIL ) (2.5 MG/3ML) 0.083% nebulizer solution Take 3 mLs by nebulization every 6 (six) hours as needed for wheezing or shortness of breath. 360 mL 3   albuterol  (VENTOLIN  HFA) 108 (90 Base) MCG/ACT inhaler Inhale 2 puffs into the lungs every 6 (six) hours as needed for wheezing or shortness of breath.     alendronate  (FOSAMAX ) 70 MG tablet Take 70 mg by mouth once a week. Take with a full glass of water on an empty stomach on Sunday mornings     alendronate  (FOSAMAX ) 70 MG tablet Take 1 tablet (70 mg total) by mouth once a week 30 minutes before first food or beverage or medicine of the day with water. 4 tablet 11   Alirocumab  (PRALUENT ) 150 MG/ML SOAJ Inject 1 mL (150 mg total) into the skin every 14 (fourteen) days. 6 mL 1   bisoprolol  (ZEBETA ) 5 MG tablet Take 0.5 tablets (2.5 mg total) by mouth daily. 45 tablet 0   Calcium  Carb-Cholecalciferol  (CALCIUM  600 + D  PO) Take 1 tablet by mouth daily.     cholecalciferol  (VITAMIN D3) 25 MCG (1000 UNIT) tablet Take 1,000 Units by mouth daily.     clindamycin  (CLEOCIN ) 150 MG capsule Take 600 mg by mouth See admin instructions. Take 600 mg by mouth 1 hour prior to dental appointment     clopidogrel  (PLAVIX ) 75 MG tablet Take 1 tablet (75 mg total) by mouth daily. 90 tablet 3   famotidine  (ZANTAC 360) 10 MG tablet Take 10 mg by mouth daily as needed for heartburn or indigestion.     Fluticasone -Umeclidin-Vilant (TRELEGY ELLIPTA ) 100-62.5-25 MCG/ACT AEPB Inhale 1 puff into the lungs daily. 60 each 11   Fluticasone -Umeclidin-Vilant (TRELEGY ELLIPTA ) 100-62.5-25 MCG/INH AEPB Inhale 1 puff into the lungs daily. 60 each 11   furosemide  (LASIX ) 40 MG tablet TAKE 1 TABLET BY MOUTH AS NEEDED FOR FLUID OR EDMA 90 tablet 0   Hypromellose 0.2 % SOLN Place 2 drops into both eyes 4 (four) times daily as needed (dry eyes).      lansoprazole  (PREVACID ) 30 MG capsule Take 30 mg by mouth in the morning.     lansoprazole  (PREVACID ) 30 MG capsule Take 1 capsule (30 mg total) by mouth daily before a meal. 90 capsule 3   lansoprazole  (PREVACID ) 30 MG capsule Take 1 capsule (30 mg total) by mouth daily before  a meal 90 capsule 1   NASAL SALINE NA Place 1 spray into the nose every 4 (four) hours as needed (congestion).     nitroGLYCERIN  (NITROSTAT ) 0.4 MG SL tablet DISSOLVE ONE TABLET UNDER TONGUE AS NEEDED FOR CHEST PAIN EVERY 5 MINUTES 25 tablet 4   rosuvastatin  (CRESTOR ) 5 MG tablet Take 1 tablet (5 mg total) by mouth 3 (three) times a week. 90 tablet 3   rosuvastatin  (CRESTOR ) 5 MG tablet Take 1 tablet (5 mg total) by mouth 3 (three) times a week. 45 tablet 3   simethicone  (MYLICON) 80 MG chewable tablet Chew 2 tablets (160 mg total) by mouth every 6 (six) hours as needed for flatulence. 30 tablet 0   Spacer/Aero-Holding Chambers (AEROCHAMBER MV) inhaler Use as instructed 1 each 0   spironolactone  (ALDACTONE ) 25 MG tablet Take 0.5  tablets (12.5 mg total) by mouth daily. 45 tablet 3   No current facility-administered medications for this visit.    REVIEW OF SYSTEMS (negative unless checked):   Cardiac:  []  Chest pain or chest pressure? []  Shortness of breath upon activity? []  Shortness of breath when lying flat? []  Irregular heart rhythm?  Vascular:  []  Pain in calf, thigh, or hip brought on by walking? []  Pain in feet at night that wakes you up from your sleep? []  Blood clot in your veins? []  Leg swelling?  Pulmonary:  []  Oxygen  at home? []  Productive cough? []  Wheezing?  Neurologic:  []  Sudden weakness in arms or legs? []  Sudden numbness in arms or legs? []  Sudden onset of difficult speaking or slurred speech? []  Temporary loss of vision in one eye? []  Problems with dizziness?  Gastrointestinal:  []  Blood in stool? []  Vomited blood?  Genitourinary:  []  Burning when urinating? []  Blood in urine?  Psychiatric:  []  Major depression  Hematologic:  []  Bleeding problems? []  Problems with blood clotting?  Dermatologic:  []  Rashes or ulcers?  Constitutional:  []  Fever or chills?  Ear/Nose/Throat:  []  Change in hearing? []  Nose bleeds? []  Sore throat?  Musculoskeletal:  []  Back pain? []  Joint pain? []  Muscle pain?   Physical Examination   Vitals:   06/18/23 1011  BP: (!) 140/64  Pulse: 66  Resp: 18  Temp: 97.8 F (36.6 C)  TempSrc: Temporal  SpO2: 97%  Weight: 115 lb 14.4 oz (52.6 kg)  Height: 4\' 11"  (1.499 m)   Body mass index is 23.41 kg/m.  General:  WDWN in NAD; vital signs documented above Gait: Not observed HENT: WNL, normocephalic Pulmonary: normal non-labored breathing , without rales, rhonchi,  wheezing Cardiac: regular Abdomen: soft, NT, no masses Skin: without rashes Vascular Exam/Pulses: Brisk DP/PT Doppler signals bilaterally Extremities: without ischemic changes, without gangrene , without cellulitis; without open wounds;  Musculoskeletal: no muscle  wasting or atrophy  Neurologic: A&O X 3;  No focal weakness or paresthesias are detected Psychiatric:  The pt has Normal affect.   Medical Decision Making   Teresa [name] is a 73 y.o. female who presents as a triage visit  The patient states on the night of 4/10 she experienced a sudden, severe cramp in the left lateral lower leg. This took a couple of hours to resolve. She had no issues with numbness, tingling, weakness, or coldness in the LLE at that time.  She has not had any further incidences of leg cramping since then. She also denies any rest pain, tissue loss, leg swelling, or claudication I reassured the patient that her leg pain  likely was not due to any changes in her blood flow. Her feet remain well perfused with brisk DP/PT doppler signals. Potentially she was experiencing a muscle cramp. She can keep her next follow up with Dr.Hawken in October with noninvasive studies   Deneise Finlay PA-C [facility] Office: [phone]  Clinic MD: [place] Job

## 2023-06-24 ENCOUNTER — Telehealth (HOSPITAL_COMMUNITY): Payer: Self-pay

## 2023-06-24 ENCOUNTER — Ambulatory Visit (HOSPITAL_COMMUNITY)
Admission: RE | Admit: 2023-06-24 | Discharge: 2023-06-24 | Disposition: A | Discharge status: Home / Self Care | Source: Ambulatory Visit | Attending: Cardiology | Admitting: Cardiology

## 2023-06-24 DIAGNOSIS — I5022 Chronic systolic (congestive) heart failure: Secondary | ICD-10-CM

## 2023-06-24 LAB — BASIC METABOLIC PANEL WITH GFR
Anion gap: 9 (ref 5–15)
BUN: 23 mg/dL (ref 8–23)
CO2: 25 mmol/L (ref 22–32)
Calcium: 9.9 mg/dL (ref 8.9–10.3)
Chloride: 105 mmol/L (ref 98–111)
Creatinine, Ser: 1.07 mg/dL — ABNORMAL HIGH (ref 0.44–1.00)
GFR, Estimated: 56 mL/min — ABNORMAL LOW (ref >60)
Glucose, Bld: 95 mg/dL (ref 70–99)
Potassium: 5.7 mmol/L — ABNORMAL HIGH (ref 3.5–5.1)
Sodium: 139 mmol/L (ref 135–145)

## 2023-06-24 NOTE — Telephone Encounter (Signed)
 Patient's Spiro medication has been discontinued and pt's chart will be updated. In addition pt's lab orders placed and appointment scheduled. Pt aware, agreeable, and verbalized understanding.

## 2023-06-24 NOTE — Telephone Encounter (Signed)
-----   Message from Elmarie Hacking sent at 06/24/2023 10:34 AM EDT ----- K is back up. Please call and ask if she is taking Entresto  or Spiro

## 2023-06-28 ENCOUNTER — Ambulatory Visit (HOSPITAL_COMMUNITY)
Admission: RE | Admit: 2023-06-28 | Discharge: 2023-06-28 | Disposition: A | Discharge status: Home / Self Care | Source: Ambulatory Visit | Attending: Cardiology | Admitting: Cardiology

## 2023-06-28 ENCOUNTER — Other Ambulatory Visit (HOSPITAL_COMMUNITY): Payer: Self-pay

## 2023-06-28 ENCOUNTER — Other Ambulatory Visit: Payer: Self-pay

## 2023-06-28 DIAGNOSIS — I5022 Chronic systolic (congestive) heart failure: Secondary | ICD-10-CM | POA: Insufficient documentation

## 2023-06-28 LAB — BASIC METABOLIC PANEL WITH GFR
Anion gap: 8 (ref 5–15)
BUN: 27 mg/dL — ABNORMAL HIGH (ref 8–23)
CO2: 26 mmol/L (ref 22–32)
Calcium: 9 mg/dL (ref 8.9–10.3)
Chloride: 104 mmol/L (ref 98–111)
Creatinine, Ser: 0.86 mg/dL (ref 0.44–1.00)
GFR, Estimated: >60 mL/min (ref >60)
Glucose, Bld: 100 mg/dL — ABNORMAL HIGH (ref 70–99)
Potassium: 4.7 mmol/L (ref 3.5–5.1)
Sodium: 138 mmol/L (ref 135–145)

## 2023-07-01 ENCOUNTER — Other Ambulatory Visit (HOSPITAL_COMMUNITY): Payer: Self-pay

## 2023-07-02 ENCOUNTER — Other Ambulatory Visit (HOSPITAL_COMMUNITY): Payer: Self-pay

## 2023-07-05 ENCOUNTER — Other Ambulatory Visit: Payer: Self-pay

## 2023-07-05 ENCOUNTER — Other Ambulatory Visit (HOSPITAL_COMMUNITY): Payer: Self-pay

## 2023-07-05 MED ORDER — CLINDAMYCIN HCL 150 MG PO CAPS
600.0000 mg | ORAL_CAPSULE | ORAL | 1 refills | Status: AC
  Filled 2023-07-05 – 2023-07-26 (×2): qty 16, 4d supply, fill #0
  Filled 2023-08-03 – 2023-08-05 (×2): qty 16, 4d supply, fill #1

## 2023-07-06 ENCOUNTER — Other Ambulatory Visit (HOSPITAL_COMMUNITY): Payer: Self-pay | Admitting: Internal Medicine

## 2023-07-08 ENCOUNTER — Other Ambulatory Visit (HOSPITAL_COMMUNITY): Payer: Self-pay

## 2023-07-09 ENCOUNTER — Other Ambulatory Visit (HOSPITAL_COMMUNITY): Payer: Self-pay

## 2023-07-09 ENCOUNTER — Other Ambulatory Visit: Payer: Self-pay

## 2023-07-09 MED ORDER — CLINDAMYCIN HCL 300 MG PO CAPS
600.0000 mg | ORAL_CAPSULE | ORAL | 5 refills | Status: AC
  Filled 2023-07-09: qty 6, 3d supply, fill #0
  Filled 2023-07-26 – 2023-07-30 (×2): qty 6, 3d supply, fill #1
  Filled 2023-08-03 – 2023-08-05 (×3): qty 6, 3d supply, fill #2
  Filled 2023-08-23 – 2023-08-28 (×2): qty 6, 3d supply, fill #3
  Filled 2023-12-06: qty 6, 3d supply, fill #4
  Filled 2024-01-02: qty 6, 3d supply, fill #5

## 2023-07-13 ENCOUNTER — Other Ambulatory Visit (HOSPITAL_BASED_OUTPATIENT_CLINIC_OR_DEPARTMENT_OTHER): Payer: Self-pay

## 2023-07-13 ENCOUNTER — Other Ambulatory Visit (HOSPITAL_COMMUNITY): Payer: Self-pay

## 2023-07-26 ENCOUNTER — Other Ambulatory Visit: Payer: Self-pay

## 2023-07-26 ENCOUNTER — Other Ambulatory Visit (HOSPITAL_COMMUNITY): Payer: Self-pay

## 2023-07-26 ENCOUNTER — Other Ambulatory Visit: Payer: Self-pay | Admitting: Internal Medicine

## 2023-07-26 DIAGNOSIS — Z952 Presence of prosthetic heart valve: Secondary | ICD-10-CM | POA: Diagnosis not present

## 2023-07-26 DIAGNOSIS — J440 Chronic obstructive pulmonary disease with acute lower respiratory infection: Secondary | ICD-10-CM | POA: Diagnosis not present

## 2023-07-26 DIAGNOSIS — E785 Hyperlipidemia, unspecified: Secondary | ICD-10-CM | POA: Diagnosis not present

## 2023-07-26 DIAGNOSIS — M810 Age-related osteoporosis without current pathological fracture: Secondary | ICD-10-CM | POA: Diagnosis not present

## 2023-07-26 DIAGNOSIS — I2510 Atherosclerotic heart disease of native coronary artery without angina pectoris: Secondary | ICD-10-CM | POA: Diagnosis not present

## 2023-07-26 DIAGNOSIS — I739 Peripheral vascular disease, unspecified: Secondary | ICD-10-CM | POA: Diagnosis not present

## 2023-07-26 DIAGNOSIS — I5042 Chronic combined systolic (congestive) and diastolic (congestive) heart failure: Secondary | ICD-10-CM | POA: Diagnosis not present

## 2023-07-26 DIAGNOSIS — N1831 Chronic kidney disease, stage 3a: Secondary | ICD-10-CM | POA: Diagnosis not present

## 2023-07-26 DIAGNOSIS — I25119 Atherosclerotic heart disease of native coronary artery with unspecified angina pectoris: Secondary | ICD-10-CM

## 2023-07-26 DIAGNOSIS — I10 Essential (primary) hypertension: Secondary | ICD-10-CM | POA: Diagnosis not present

## 2023-07-26 DIAGNOSIS — D649 Anemia, unspecified: Secondary | ICD-10-CM | POA: Diagnosis not present

## 2023-07-30 ENCOUNTER — Ambulatory Visit: Payer: PPO | Admitting: Vascular Surgery

## 2023-07-30 ENCOUNTER — Encounter (HOSPITAL_COMMUNITY): Payer: PPO

## 2023-07-30 MED ORDER — PRALUENT 150 MG/ML ~~LOC~~ SOAJ
150.0000 mg | SUBCUTANEOUS | 1 refills | Status: DC
  Filled 2023-07-30 – 2023-09-18 (×3): qty 6, 84d supply, fill #0
  Filled 2023-11-24: qty 6, 84d supply, fill #1

## 2023-07-31 ENCOUNTER — Other Ambulatory Visit (HOSPITAL_COMMUNITY): Payer: Self-pay

## 2023-07-31 ENCOUNTER — Other Ambulatory Visit: Payer: Self-pay

## 2023-08-03 ENCOUNTER — Other Ambulatory Visit: Payer: Self-pay | Admitting: Interventional Cardiology

## 2023-08-03 ENCOUNTER — Other Ambulatory Visit (HOSPITAL_COMMUNITY): Payer: Self-pay | Admitting: Internal Medicine

## 2023-08-03 ENCOUNTER — Other Ambulatory Visit (HOSPITAL_BASED_OUTPATIENT_CLINIC_OR_DEPARTMENT_OTHER): Payer: Self-pay

## 2023-08-03 ENCOUNTER — Other Ambulatory Visit (HOSPITAL_COMMUNITY): Payer: Self-pay

## 2023-08-05 ENCOUNTER — Other Ambulatory Visit (HOSPITAL_COMMUNITY): Payer: Self-pay

## 2023-08-05 ENCOUNTER — Other Ambulatory Visit: Payer: Self-pay

## 2023-08-05 MED ORDER — NITROGLYCERIN 0.4 MG SL SUBL
SUBLINGUAL_TABLET | SUBLINGUAL | 4 refills | Status: AC
  Filled 2023-08-05: qty 25, 8d supply, fill #0
  Filled 2023-11-24: qty 25, 8d supply, fill #1
  Filled 2023-12-06: qty 25, 8d supply, fill #2
  Filled 2024-01-02: qty 25, 8d supply, fill #3

## 2023-08-07 ENCOUNTER — Other Ambulatory Visit: Payer: Self-pay

## 2023-08-07 ENCOUNTER — Other Ambulatory Visit (HOSPITAL_BASED_OUTPATIENT_CLINIC_OR_DEPARTMENT_OTHER): Payer: Self-pay

## 2023-08-07 ENCOUNTER — Other Ambulatory Visit (HOSPITAL_COMMUNITY): Payer: Self-pay

## 2023-08-07 MED ORDER — BISOPROLOL FUMARATE 5 MG PO TABS
2.5000 mg | ORAL_TABLET | Freq: Every day | ORAL | 0 refills | Status: DC
  Filled 2023-08-07 – 2023-10-08 (×3): qty 45, 90d supply, fill #0

## 2023-08-23 ENCOUNTER — Other Ambulatory Visit: Payer: Self-pay

## 2023-08-23 ENCOUNTER — Other Ambulatory Visit (HOSPITAL_COMMUNITY): Payer: Self-pay

## 2023-08-28 ENCOUNTER — Other Ambulatory Visit: Payer: Self-pay

## 2023-08-29 ENCOUNTER — Other Ambulatory Visit (HOSPITAL_COMMUNITY): Payer: Self-pay

## 2023-09-13 ENCOUNTER — Other Ambulatory Visit (HOSPITAL_COMMUNITY): Payer: Self-pay

## 2023-09-13 ENCOUNTER — Other Ambulatory Visit: Payer: Self-pay

## 2023-09-13 MED ORDER — ALBUTEROL SULFATE HFA 108 (90 BASE) MCG/ACT IN AERS
1.0000 | INHALATION_SPRAY | RESPIRATORY_TRACT | 2 refills | Status: AC | PRN
  Filled 2023-09-13: qty 20.1, 100d supply, fill #0

## 2023-09-17 ENCOUNTER — Other Ambulatory Visit: Payer: Self-pay

## 2023-09-17 ENCOUNTER — Other Ambulatory Visit (HOSPITAL_COMMUNITY): Payer: Self-pay

## 2023-09-18 ENCOUNTER — Other Ambulatory Visit (HOSPITAL_COMMUNITY): Payer: Self-pay

## 2023-09-18 ENCOUNTER — Other Ambulatory Visit: Payer: Self-pay

## 2023-10-06 ENCOUNTER — Other Ambulatory Visit (HOSPITAL_COMMUNITY): Payer: Self-pay | Admitting: Internal Medicine

## 2023-10-08 ENCOUNTER — Other Ambulatory Visit: Payer: Self-pay

## 2023-10-08 ENCOUNTER — Other Ambulatory Visit (HOSPITAL_COMMUNITY): Payer: Self-pay

## 2023-10-09 NOTE — Telephone Encounter (Signed)
 See additional phone note.

## 2023-10-09 NOTE — Telephone Encounter (Signed)
 See other phone note

## 2023-10-14 ENCOUNTER — Other Ambulatory Visit: Payer: Self-pay

## 2023-10-14 ENCOUNTER — Other Ambulatory Visit (HOSPITAL_COMMUNITY): Payer: Self-pay

## 2023-10-22 ENCOUNTER — Other Ambulatory Visit (HOSPITAL_COMMUNITY): Payer: Self-pay

## 2023-10-22 DIAGNOSIS — H02834 Dermatochalasis of left upper eyelid: Secondary | ICD-10-CM | POA: Diagnosis not present

## 2023-10-22 DIAGNOSIS — H02831 Dermatochalasis of right upper eyelid: Secondary | ICD-10-CM | POA: Diagnosis not present

## 2023-10-22 DIAGNOSIS — J40 Bronchitis, not specified as acute or chronic: Secondary | ICD-10-CM | POA: Diagnosis not present

## 2023-10-22 DIAGNOSIS — I10 Essential (primary) hypertension: Secondary | ICD-10-CM | POA: Diagnosis not present

## 2023-10-22 DIAGNOSIS — K219 Gastro-esophageal reflux disease without esophagitis: Secondary | ICD-10-CM | POA: Diagnosis not present

## 2023-10-22 DIAGNOSIS — H25813 Combined forms of age-related cataract, bilateral: Secondary | ICD-10-CM | POA: Diagnosis not present

## 2023-10-22 DIAGNOSIS — I2510 Atherosclerotic heart disease of native coronary artery without angina pectoris: Secondary | ICD-10-CM | POA: Diagnosis not present

## 2023-10-22 DIAGNOSIS — I739 Peripheral vascular disease, unspecified: Secondary | ICD-10-CM | POA: Diagnosis not present

## 2023-10-22 DIAGNOSIS — I5040 Unspecified combined systolic (congestive) and diastolic (congestive) heart failure: Secondary | ICD-10-CM | POA: Diagnosis not present

## 2023-10-22 DIAGNOSIS — R0602 Shortness of breath: Secondary | ICD-10-CM | POA: Diagnosis not present

## 2023-10-23 ENCOUNTER — Other Ambulatory Visit (HOSPITAL_COMMUNITY): Payer: Self-pay

## 2023-11-02 ENCOUNTER — Other Ambulatory Visit (HOSPITAL_COMMUNITY): Payer: Self-pay

## 2023-11-03 ENCOUNTER — Other Ambulatory Visit: Payer: Self-pay

## 2023-11-10 ENCOUNTER — Other Ambulatory Visit (HOSPITAL_COMMUNITY): Payer: Self-pay

## 2023-11-11 ENCOUNTER — Other Ambulatory Visit: Payer: Self-pay

## 2023-11-24 ENCOUNTER — Other Ambulatory Visit (HOSPITAL_COMMUNITY): Payer: Self-pay

## 2023-11-25 ENCOUNTER — Other Ambulatory Visit (HOSPITAL_COMMUNITY): Payer: Self-pay

## 2023-11-25 ENCOUNTER — Other Ambulatory Visit: Payer: Self-pay

## 2023-11-25 MED ORDER — LANSOPRAZOLE 30 MG PO CPDR
30.0000 mg | DELAYED_RELEASE_CAPSULE | Freq: Every day | ORAL | 3 refills | Status: AC
  Filled 2023-11-25: qty 90, 90d supply, fill #0
  Filled 2024-03-26: qty 90, 90d supply, fill #1

## 2023-12-02 ENCOUNTER — Other Ambulatory Visit: Payer: Self-pay

## 2023-12-02 ENCOUNTER — Other Ambulatory Visit (HOSPITAL_COMMUNITY): Payer: Self-pay

## 2023-12-06 ENCOUNTER — Other Ambulatory Visit (HOSPITAL_COMMUNITY): Payer: Self-pay

## 2023-12-09 ENCOUNTER — Other Ambulatory Visit: Payer: Self-pay

## 2023-12-09 ENCOUNTER — Other Ambulatory Visit (HOSPITAL_COMMUNITY): Payer: Self-pay

## 2023-12-09 ENCOUNTER — Other Ambulatory Visit: Payer: Self-pay | Admitting: Internal Medicine

## 2023-12-09 ENCOUNTER — Other Ambulatory Visit (HOSPITAL_COMMUNITY): Payer: Self-pay | Admitting: Internal Medicine

## 2023-12-09 DIAGNOSIS — E785 Hyperlipidemia, unspecified: Secondary | ICD-10-CM

## 2023-12-09 DIAGNOSIS — I2510 Atherosclerotic heart disease of native coronary artery without angina pectoris: Secondary | ICD-10-CM

## 2023-12-09 DIAGNOSIS — I25119 Atherosclerotic heart disease of native coronary artery with unspecified angina pectoris: Secondary | ICD-10-CM

## 2023-12-09 MED ORDER — BISOPROLOL FUMARATE 5 MG PO TABS
2.5000 mg | ORAL_TABLET | Freq: Every day | ORAL | 0 refills | Status: DC
  Filled 2023-12-09 – 2024-01-06 (×2): qty 15, 30d supply, fill #0

## 2023-12-09 MED ORDER — PRALUENT 150 MG/ML ~~LOC~~ SOAJ
150.0000 mg | SUBCUTANEOUS | 1 refills | Status: AC
  Filled 2023-12-09 – 2024-02-23 (×2): qty 6, 84d supply, fill #0

## 2023-12-10 ENCOUNTER — Other Ambulatory Visit (HOSPITAL_COMMUNITY): Payer: Self-pay

## 2023-12-10 ENCOUNTER — Encounter (HOSPITAL_COMMUNITY): Payer: Self-pay

## 2023-12-16 NOTE — Progress Notes (Unsigned)
 [facility]  ASSESSMENT / PLAN: Teresa [name] is a 73 y.o. female status post aorto-bi-femoral bypass (14 x 8mm Dacron) on 09/20/21. Status post SMA angioplasty and stenting 05/10/23 for chronic mesenteric ischemia  Recommend:  Complete cessation from all tobacco products. Blood glucose control with goal A1c < 7%. Blood pressure control with goal blood pressure < 140/90 mmHg. Lipid reduction therapy with goal LDL-C <100 mg/dL (<29 if symptomatic from PAD).  Clopidogrel  75mg  PO QD. Antilipid therapy per cardiology service.  Daily walking to and past the point of discomfort. Patient counseled to keep a log of exercise distance.  Doing well overall.  Will continue surveillance of Teresa [name] vascular disease with follow-up in 6 months with mesenteric duplex and ankle-brachial index.  CHIEF COMPLAINT: Left leg cramping with walking  HISTORY OF PRESENT ILLNESS: Teresa [name] is a 73 y.o. female with extensive past vascular history (multiple PCI's, TAVR, congestive heart failure, etc.) who presents to clinic for evaluation of cramping discomfort in her left calf with walking.  The patient reports she can walk about the distance of 2 storefront's before severe cramping pain in her calf begins to bother her.  She has to rest for the pain to be relieved.  She has no pain at rest.  She denies any ulcers about her feet.  08/01/21: Patient returns to clinic for reevaluation.  She reports she can get about 50 yards before she has severe cramping in her left thigh and calf.  This is interfering with her ability to care for children and take care of her IADLs.  We had a long discussion about risks, benefits, and alternatives to intervention for claudication.  She desires intervention.  09/12/21: Patient returns after angiogram, CT scan, and cardiology evaluation.  She is ready to proceed with surgery.  We again reviewed the risks, benefits, and alternatives.  We again clarified her  symptomology.  She reports short distance buttock and thigh claudication which is relieved by rest.  She does not report symptoms are relieved by positional change.  She does not have symptoms when standing for long period of time.  I do think her symptoms are related to peripheral arterial disease.  03/06/22: In the clinic to evaluate for a knot in her abdomen.  Patient reports a lump in her left lower quadrant which is more noticeable when she stands.  It is somewhat tender.  It mostly bothers her at night when she is resting.  She does not report any obstructive symptoms.  04/03/22: patient returns to clinic to discuss CT scan. Ventral hernia confirmed. She has seen Dr. Stevie and is planning on fixing this with open ventral hernia repair. Incidental is proximal anastomotic stenosis. Patient reports no claudication, rest pain, or ischemic ulceration.  06/19/22: Doing very well overall.  Reports no symptoms in her lower extremities.  Can walk is much as she likes.  Plans to undergo ventral hernia repair with Dr. Stevie in the near future  05/09/23 (with PA Teresa [name]): The pt returns today for follow up.  Pt was seen by Dr. Bettyjane on 05/07/2023 and at that time, she was c/o post prandial abdominal pain.  She was urgently referred for evaluation.     She states she has been having abdominal pain after eating since August but has progressively been getting worse.  She states that it starts about 15-30 minutes after she eats and lasts until she is able to have a BM.  She does have some fear  of eating due to the pain and has had about a 5 lb weight loss.   06/11/23: patient returns after mesenteric intervention for CMI. She reports symptom relief. No problems with eating. She is gaining weight back. We reviewed her duplex in detail.   12/17/23: Returns to clinic for follow-up and surveillance of Teresa [name] vascular disease.  She is doing well overall.  No mesenteric ischemia symptoms.  No claudication symptoms.   She does have some deconditioning of her legs, but is trying to walk daily.  No typical claudication or rest pain symptoms.  No ulcers about the feet.  VASCULAR SURGICAL HISTORY: none  VASCULAR RISK FACTORS: Negative history of stroke / transient ischemic attack. Positive history of coronary artery disease. + history of PCI.  Negative history of CABG.  Negative history of diabetes mellitus.  No recent A1c available to me. Positive history of smoking.  Not actively smoking. Positive history of hypertension.  Negative history of chronic kidney disease.   Positive history of chronic obstructive pulmonary disease. Not on oxygen .  FUNCTIONAL STATUS: ECOG performance status: (1) Restricted in physically strenuous activity, ambulatory and able to do work of light nature Ambulatory status: Ambulatory within the community with limits  Past Medical History:  Diagnosis Date   AAA (abdominal aortic aneurysm)    a. small by cath 2014.   Acute on chronic combined systolic and diastolic heart failure (HCC) 08/19/2018   Arthritis    across my hips; buttocks; comes w/the weather (02/27/2013)   CAD (coronary artery disease), native coronary artery    a. 10/2012 - rotational atherectomy of LAD c/b dissection s/p PCI to LAD, LCx, LM. b. 12/2012 - s/p overlapping DES to RCA.   Cancer [facility])    Cataract    just the beginnings on the right (02/27/2013)   Chronic combined systolic and diastolic CHF (congestive heart failure) (HCC)    COPD (chronic obstructive pulmonary disease) (HCC)    Coronary artery disease    GERD (gastroesophageal reflux disease)    Hypercholesterolemia    Ischemic cardiomyopathy    a. EF 45-50%.   Mass of lung    small tumor RUL; they are watching it (02/27/2013)   Mitral regurgitation    Peripheral vascular disease, unspecified    Pneumonia    S/P TAVR (transcatheter aortic valve replacement)    23 mm Edwards Sapien 3 transcatheter heart valve placed via open left  transaxillary approach    Severe aortic stenosis    Severe mitral regurgitation    Tachycardia, unspecified    Tobacco abuse     Past Surgical History:  Procedure Laterality Date   ABDOMINAL AORTOGRAM W/LOWER EXTREMITY N/A 08/03/2021   Procedure: ABDOMINAL AORTOGRAM W/LOWER EXTREMITY;  Surgeon: Gretta Lonni PARAS, MD;  Location: [facility];  Service: Cardiovascular;  Laterality: N/A;   ABDOMINAL AORTOGRAM W/LOWER EXTREMITY N/A 04/13/2022   Procedure: ABDOMINAL AORTOGRAM W/LOWER EXTREMITY;  Surgeon: Magda Debby SAILOR, MD;  Location: [facility];  Service: Cardiovascular;  Laterality: N/A;   AORTA - BILATERAL FEMORAL ARTERY BYPASS GRAFT Bilateral 09/20/2021   Procedure: AORTA BIFEMORAL BYPASS GRAFT WITH EPIDURAL CATHETER and  aortic endartarectomey and bilateral femoral endartarectomies.;  Surgeon: Magda Debby SAILOR, MD;  Location[facility] OR;  Service: Vascular;  Laterality: Bilateral;   CARDIAC CATHETERIZATION  08/2018   CORONARY ANGIOPLASTY WITH STENT PLACEMENT  10/2012; 12/08/2012   7 + 3 (02/27/2013)   ESOPHAGOGASTRODUODENOSCOPY (EGD) WITH PROPOFOL  N/A 01/11/2015   Procedure: ESOPHAGOGASTRODUODENOSCOPY (EGD) WITH PROPOFOL ;  Surgeon: Gladis  MARLA Louder, MD;  Location: [facility];  Service: Endoscopy;  Laterality: N/A;   FLEXIBLE BRONCHOSCOPY N/A 03/02/2013   Procedure: FLEXIBLE BRONCHOSCOPY;  Surgeon: Dorise MARLA Fellers, MD;  Location: [facility] OR;  Service: Thoracic;  Laterality: N/A;   FLEXIBLE SIGMOIDOSCOPY N/A 01/11/2015   Procedure: FLEXIBLE SIGMOIDOSCOPY;  Surgeon: Gladis MARLA Louder, MD;  Location: [facility];  Service: Endoscopy;  Laterality: N/A;  unable to complete colon-prep issues   GANGLION CYST EXCISION Left 1975   wrist   INCISIONAL HERNIA REPAIR N/A 08/30/2022   Procedure: INCISIONAL HERNIA REPAIR WITH MESH;  Surgeon: Kinsinger, Herlene Righter, MD;  Location: [facility] ORS;  Service: General;  Laterality: N/A;   LEFT HEART CATH AND CORONARY ANGIOGRAPHY N/A 06/29/2016   Procedure: Left  Heart Cath and Coronary Angiography;  Surgeon: Candyce GORMAN Reek, MD;  Location: [facility] INVASIVE CV LAB;  Service: Cardiovascular;  Laterality: N/A;   LEFT HEART CATH AND CORONARY ANGIOGRAPHY N/A 08/19/2018   Procedure: LEFT HEART CATH AND CORONARY ANGIOGRAPHY;  Surgeon: Verlin Lonni BIRCH, MD;  Location: [facility];  Service: Cardiovascular;  Laterality: N/A;   LEFT HEART CATHETERIZATION WITH CORONARY ANGIOGRAM N/A 12/08/2012   Procedure: LEFT HEART CATHETERIZATION WITH CORONARY ANGIOGRAM;  Surgeon: Candyce GORMAN Reek, MD;  Location: [facility] CATH LAB;  Service: Cardiovascular;  Laterality: N/A;   MOUTH SURGERY  2010?   for bone loss (06/24/2012)   PERCUTANEOUS CORONARY STENT INTERVENTION (PCI-S) N/A 10/09/2012   Procedure: PERCUTANEOUS CORONARY STENT INTERVENTION (PCI-S);  Surgeon: Candyce GORMAN Reek, MD;  Location: [facility] CATH LAB;  Service: Cardiovascular;  Laterality: N/A;   RIGHT HEART CATH N/A 08/19/2018   Procedure: RIGHT HEART CATH;  Surgeon: Verlin Lonni BIRCH, MD;  Location: [facility];  Service: Cardiovascular;  Laterality: N/A;   TEE WITHOUT CARDIOVERSION N/A 08/22/2018   Procedure: TRANSESOPHAGEAL ECHOCARDIOGRAM (TEE);  Surgeon: Mona Vinie BROCKS, MD;  Location: [facility] ENDOSCOPY;  Service: Cardiovascular;  Laterality: N/A;   TEE WITHOUT CARDIOVERSION N/A 08/26/2018   Procedure: TRANSESOPHAGEAL ECHOCARDIOGRAM (TEE);  Surgeon: Verlin Lonni BIRCH, MD;  Location: [facility] OR;  Service: Open Heart Surgery;  Laterality: N/A;   THORACOTOMY/LOBECTOMY Right 03/02/2013   Procedure: Right Video Assisted Thoracoscopy/Thoracotomy with upper Lobectomy;  Surgeon: Dorise MARLA Fellers, MD;  Location: [facility] OR;  Service: Thoracic;  Laterality: Right;  Right Lung Upper  Lobectomy    TONSILLECTOMY  1960   VIDEO BRONCHOSCOPY  09/12/2011   Procedure: VIDEO BRONCHOSCOPY WITH FLUORO;  Surgeon: Ozell KATHEE America, MD;  Location: [facility];  Service: Cardiopulmonary;  Laterality: Bilateral;   VISCERAL ANGIOGRAPHY  N/A 05/10/2023   Procedure: VISCERAL ANGIOGRAPHY;  Surgeon: Magda Debby SAILOR, MD;  Location: [facility];  Service: Cardiovascular;  Laterality: N/A;   VISCERAL ARTERY INTERVENTION N/A 05/10/2023   Procedure: VISCERAL ARTERY INTERVENTION;  Surgeon: Magda Debby SAILOR, MD;  Location: [facility];  Service: Cardiovascular;  Laterality: N/A;  SMA    Family History  Problem Relation Age of Onset   Alcohol  abuse Father    Lupus Mother    Other Sister        Degenerative disc disease   Migraines Sister    Pulmonary fibrosis Brother    Heart attack Neg Hx     Social History   Socioeconomic History   Marital status: Married    Spouse name: Not on file   Number of children: 1   Years of education: 12   Highest education level: High school graduate  Occupational History   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  Tobacco Use   Smoking status: Former    Current packs/day: 0.00    Average packs/day: 0.5 packs/day for 40.0 years (20.0 ttl pk-yrs)    Types: Cigarettes    Start date: 10/08/1972    Quit date: 10/08/2012    Years since quitting: 11.1   Smokeless tobacco: Never  Vaping Use   Vaping status: Never Used  Substance and Sexual Activity   Alcohol  use: No   Drug use: No   Sexual activity: Yes  Other Topics Concern   Not on file  Social History Narrative   Not on file   Social Drivers of Health   Financial Resource Strain: Low Risk  (10/07/2018)   Overall Financial Resource Strain (CARDIA)    Difficulty of Paying Living Expenses: Not hard at all  Food Insecurity: No Food Insecurity (05/11/2023)   Hunger Vital Sign    Worried About Running Out of Food in the Last Year: Never true    Ran Out of Food in the Last Year: Never true  Transportation Needs: No Transportation Needs (05/11/2023)   PRAPARE - Administrator, Civil Service (Medical): No    Lack of Transportation (Non-Medical): No  Physical Activity: Insufficiently Active (10/07/2018)   Exercise Vital Sign    Days  of Exercise per Week: 4 days    Minutes of Exercise per Session: 30 min  Stress: No Stress Concern Present (10/07/2018)   [employer] of Occupational Health - Occupational Stress Questionnaire    Feeling of Stress : Only a little  Social Connections: Socially Integrated (05/11/2023)   Social Connection and Isolation Panel    Frequency of Communication with Friends and Family: More than three times a week    Frequency of Social Gatherings with Friends and Family: Twice a week    Attends Religious Services: 1 to 4 times per year    Active Member of [employer] or Organizations: No    Attends Engineer, structural: 1 to 4 times per year    Marital Status: Married  Catering manager Violence: Not At Risk (05/11/2023)   Humiliation, Afraid, Rape, and Kick questionnaire    Fear of Current or Ex-Partner: No    Emotionally Abused: No    Physically Abused: No    Sexually Abused: No    Allergies  Allergen Reactions   Azithromycin Shortness Of Breath    Other Reaction(s): burning rash/body   Cefaclor Rash and Shortness Of Breath    Other Reaction(s): breathing difficulty   Penicillins Other (See Comments)    Unknown from childhood Did it involve swelling of the face/tongue/throat, SOB, or low BP? No Did it involve sudden or severe rash/hives, skin peeling, or any reaction on the inside of your mouth or nose? No Did you need to seek medical attention at a hospital or doctor's office? Yes When did it last happen? Infancy  If all above answers are "NO", may proceed with cephalosporin use.    Septra [Sulfamethoxazole-Trimethoprim] Shortness Of Breath and Rash   Doxycycline Swelling and Other (See Comments)    Redness on the face   Lipitor [Atorvastatin ] Other (See Comments)    Severe muscle aches   Morphine  And Codeine Nausea And Vomiting and Other (See Comments)    Terrible headache   Zetia [Ezetimibe] Other (See Comments)    Severe stomach pain   Zocor [Simvastatin] Other (See  Comments)    Severe muscle aches   Adhesive [Tape] Other (See Comments)    Redness  and swelling - use paper tape   Vicodin [Hydrocodone -Acetaminophen ] Nausea And Vomiting    Current Outpatient Medications  Medication Sig Dispense Refill   acetaminophen  (TYLENOL ) 500 MG tablet Take 1,000 mg by mouth every 6 (six) hours as needed for moderate pain.     albuterol  (PROVENTIL ) (2.5 MG/3ML) 0.083% nebulizer solution Take 3 mLs by nebulization every 6 (six) hours as needed for wheezing or shortness of breath. 360 mL 3   albuterol  (VENTOLIN  HFA) 108 (90 Base) MCG/ACT inhaler Inhale 2 puffs into the lungs every 6 (six) hours as needed for wheezing or shortness of breath.     albuterol  (VENTOLIN  HFA) 108 (90 Base) MCG/ACT inhaler Inhale 1 puff into the lungs every 4 (four) hours as needed. 20.1 g 2   alendronate  (FOSAMAX ) 70 MG tablet Take 70 mg by mouth once a week. Take with a full glass of water on an empty stomach on Sunday mornings     alendronate  (FOSAMAX ) 70 MG tablet Take 1 tablet (70 mg total) by mouth once a week 30 minutes before first food or beverage or medicine of the day with water. 4 tablet 11   Alirocumab  (PRALUENT ) 150 MG/ML SOAJ Inject 1 mL (150 mg total) into the skin every 14 (fourteen) days. 6 mL 1   bisoprolol  (ZEBETA ) 5 MG tablet Take 0.5 tablets (2.5 mg total) by mouth daily. 15 tablet 0   Calcium  Carb-Cholecalciferol  (CALCIUM  600 + D PO) Take 1 tablet by mouth daily.     cholecalciferol  (VITAMIN D3) 25 MCG (1000 UNIT) tablet Take 1,000 Units by mouth daily.     clindamycin  (CLEOCIN ) 150 MG capsule Take 600 mg by mouth See admin instructions. Take 600 mg by mouth 1 hour prior to dental appointment     clindamycin  (CLEOCIN ) 150 MG capsule Take 4 capsules (600 mg total) by mouth 1 hour before dental appointment. 16 capsule 1   clindamycin  (CLEOCIN ) 300 MG capsule Take 2 capsules by mouth as a single dose with food 1 hour before dental procedure 6 capsule 5   clopidogrel  (PLAVIX ) 75  MG tablet Take 1 tablet (75 mg total) by mouth daily. 90 tablet 3   famotidine  (ZANTAC 360) 10 MG tablet Take 10 mg by mouth daily as needed for heartburn or indigestion.     Fluticasone -Umeclidin-Vilant (TRELEGY ELLIPTA ) 100-62.5-25 MCG/ACT AEPB Inhale 1 puff into the lungs daily. 60 each 11   Fluticasone -Umeclidin-Vilant (TRELEGY ELLIPTA ) 100-62.5-25 MCG/INH AEPB Inhale 1 puff into the lungs daily. 60 each 11   furosemide  (LASIX ) 40 MG tablet TAKE 1 TABLET BY MOUTH AS NEEDED FOR FLUID OR EDMA 90 tablet 0   Hypromellose 0.2 % SOLN Place 2 drops into both eyes 4 (four) times daily as needed (dry eyes).      lansoprazole  (PREVACID ) 30 MG capsule Take 30 mg by mouth in the morning.     lansoprazole  (PREVACID ) 30 MG capsule Take 1 capsule (30 mg total) by mouth daily before a meal 90 capsule 1   lansoprazole  (PREVACID ) 30 MG capsule Take 1 capsule (30 mg total) by mouth daily before a meal. 90 capsule 3   NASAL SALINE NA Place 1 spray into the nose every 4 (four) hours as needed (congestion).     nitroGLYCERIN  (NITROSTAT ) 0.4 MG SL tablet DISSOLVE ONE TABLET UNDER TONGUE AS NEEDED FOR CHEST PAIN EVERY 5 MINUTES 25 tablet 4   rosuvastatin  (CRESTOR ) 5 MG tablet Take 1 tablet (5 mg total) by mouth 3 (three) times a week. 90 tablet  3   rosuvastatin  (CRESTOR ) 5 MG tablet Take 1 tablet (5 mg total) by mouth 3 (three) times a week. 45 tablet 3   simethicone  (MYLICON) 80 MG chewable tablet Chew 2 tablets (160 mg total) by mouth every 6 (six) hours as needed for flatulence. 30 tablet 0   Spacer/Aero-Holding Chambers (AEROCHAMBER MV) inhaler Use as instructed 1 each 0   No current facility-administered medications for this visit.    PHYSICAL EXAM Vitals:   12/17/23 0926  BP: 118/72  Pulse: 63  Resp: 20  Temp: 97.8 F (36.6 C)  SpO2: 96%  Weight: 122 lb (55.3 kg)  Height: 4' 11 (1.499 m)    Well-appearing woman in no acute distress Regular rate and rhythm Unlabored breathing Palpable anterior  tibial pulses bilaterally  PERTINENT LABORATORY AND RADIOLOGIC DATA  Most recent CBC    Latest Ref Rng & Units 05/11/2023    2:19 PM 05/11/2023    7:34 AM 05/10/2023   11:53 PM  CBC  WBC 4.0 - 10.5 K/uL 9.4  8.6  9.6   Hemoglobin 12.0 - 15.0 g/dL 89.9  89.2  88.4   Hematocrit 36.0 - 46.0 % 29.9  32.3  35.3   Platelets 150 - 400 K/uL 255  229  220      Most recent CMP    Latest Ref Rng & Units 06/28/2023   12:50 PM 06/24/2023    8:48 AM 06/14/2023   12:03 PM  CMP  Glucose 70 - 99 mg/dL 899  95  90   BUN 8 - 23 mg/dL 27  23  31    Creatinine 0.44 - 1.00 mg/dL 9.13  8.92  8.93   Sodium 135 - 145 mmol/L 138  139  136   Potassium 3.5 - 5.1 mmol/L 4.7  5.7  4.4   Chloride 98 - 111 mmol/L 104  105  97   CO2 22 - 32 mmol/L 26  25  26    Calcium  8.9 - 10.3 mg/dL 9.0  9.9  9.6    Hgb J8r MFr Bld (%)  Date Value  08/30/2022 6.1 (H)    LDL Chol Calc (NIH)  Date Value Ref Range Status  09/06/2021 101 (H) 0 - 99 mg/dL Final   LDL Cholesterol  Date Value Ref Range Status  05/10/2023 33 0 - 99 mg/dL Final    Comment:           Total Cholesterol/HDL:CHD Risk Coronary Heart Disease Risk Table                     Men   Women  1/2 Average Risk   3.4   3.3  Average Risk       5.0   4.4  2 X Average Risk   9.6   7.1  3 X Average Risk  23.4   11.0        Use the calculated Patient Ratio above and the CHD Risk Table to determine the patient's CHD Risk.        ATP III CLASSIFICATION (LDL):  <100     mg/dL   Optimal  899-870  mg/dL   Near or Above                    Optimal  130-159  mg/dL   Borderline  839-810  mg/dL   High  >809     mg/dL   Very High Performed at [facility], [address], [place], [place] [address]     +-------+-----------+-----------+------------+------------+  ABI/TBIToday's ABIToday's TBIPrevious ABIPrevious TBI  +-------+-----------+-----------+------------+------------+  Right 0.99       0.49       1.09        0.51           +-------+-----------+-----------+------------+------------+  Left  0.96       0.58       1.00        0.42          +-------+-----------+-----------+------------+------------+   ABDOMINAL VISCERAL   Patient Name:  Teresa [name]  Date of Exam:   12/17/2023  Medical Rec #: [id number]         Accession #:    [id number]  Date of Birth: 1950/05/08        Patient Gender: F  Patient Age:   44 years  Exam Location:  [address]  Procedure:      VAS US  MESENTERIC  Referring Phys: [id number] DEBBY SAILOR Lenee Franze    ---------------------------------------------------------------------------  -----   Indications: SMA stent evaluation   Vascular Interventions: 05/10/23: Angioplasty and stent of the SMA.   Limitations: Air/bowel gas.  Performing Technologist: Geni Lodge RVS, RCS     Examination Guidelines: A complete evaluation includes B-mode imaging,  spectral  Doppler, color Doppler, and power Doppler as needed of all accessible  portions  of each vessel. Bilateral testing is considered an integral part of a  complete  examination. Limited examinations for reoccurring indications may be  performed  as noted.     Duplex Findings:  +------------+--------+--------+------+--------+  Mesenteric PSV cm/sEDV cm/sPlaqueComments  +------------+--------+--------+------+--------+  SMA Proximal  171                           +------------+--------+--------+------+--------+  SMA Mid       214                           +------------+--------+--------+------+--------+  SMA Distal    142                           +------------+--------+--------+------+--------+           Summary:  Mesenteric:    Patent SMA stent without stenosis based on limited visualization due to  overlying bowel gas.    *See table(s) above for measurements and observations.      DEBBY SAILOR. Magda, MD FACS [facility] Phone Number: [phone] 12/16/2023 8:49 PM  Total time spent on preparing this encounter including chart review, data review, collecting history, examining the patient, coordinating care for this established patient, 20 minutes.  Portions of this report may have been transcribed using voice recognition software.  Every effort has been made to ensure accuracy; however, inadvertent computerized transcription errors may still be present.

## 2023-12-17 ENCOUNTER — Ambulatory Visit (HOSPITAL_BASED_OUTPATIENT_CLINIC_OR_DEPARTMENT_OTHER)
Admission: RE | Admit: 2023-12-17 | Discharge: 2023-12-17 | Disposition: A | Discharge status: Home / Self Care | Source: Ambulatory Visit | Attending: Vascular Surgery | Admitting: Vascular Surgery

## 2023-12-17 ENCOUNTER — Encounter: Payer: Self-pay | Admitting: Vascular Surgery

## 2023-12-17 ENCOUNTER — Ambulatory Visit (HOSPITAL_COMMUNITY)
Admission: RE | Admit: 2023-12-17 | Discharge: 2023-12-17 | Disposition: A | Discharge status: Home / Self Care | Source: Ambulatory Visit | Attending: Vascular Surgery | Admitting: Vascular Surgery

## 2023-12-17 ENCOUNTER — Ambulatory Visit: Admitting: Vascular Surgery

## 2023-12-17 VITALS — BP 118/72 | HR 63 | Temp 97.8°F | Resp 20 | Ht 59.0 in | Wt 122.0 lb

## 2023-12-17 DIAGNOSIS — I7409 Other arterial embolism and thrombosis of abdominal aorta: Secondary | ICD-10-CM

## 2023-12-17 DIAGNOSIS — K551 Chronic vascular disorders of intestine: Secondary | ICD-10-CM

## 2023-12-17 DIAGNOSIS — I739 Peripheral vascular disease, unspecified: Secondary | ICD-10-CM | POA: Diagnosis not present

## 2023-12-17 LAB — VAS US ABI WITH/WO TBI
Left ABI: 0.96
Right ABI: 0.99

## 2023-12-19 ENCOUNTER — Other Ambulatory Visit: Payer: Self-pay | Admitting: *Deleted

## 2023-12-19 DIAGNOSIS — I739 Peripheral vascular disease, unspecified: Secondary | ICD-10-CM

## 2023-12-19 DIAGNOSIS — K551 Chronic vascular disorders of intestine: Secondary | ICD-10-CM

## 2023-12-25 ENCOUNTER — Other Ambulatory Visit (HOSPITAL_COMMUNITY): Payer: Self-pay

## 2023-12-25 ENCOUNTER — Telehealth (HOSPITAL_COMMUNITY): Payer: Self-pay

## 2023-12-25 NOTE — Telephone Encounter (Signed)
 Advanced Heart Failure Patient Advocate Encounter  The patient was approved for a [employer] grant that will help cover the cost of Praluent .  Total amount awarded, $2,500.  Effective: 12/05/2023 - 12/03/2024.  BIN [id number] PCN [id number] [id number] ID [id number]  Approval and processing information added to [facility]. Patient informed via phone.  Rachel DEL, CPhT Rx Patient Advocate Phone: [phone]

## 2024-01-02 ENCOUNTER — Other Ambulatory Visit: Payer: Self-pay

## 2024-01-02 ENCOUNTER — Other Ambulatory Visit (HOSPITAL_COMMUNITY): Payer: Self-pay

## 2024-01-02 MED ORDER — TRELEGY ELLIPTA 100-62.5-25 MCG/ACT IN AEPB
1.0000 | INHALATION_SPRAY | Freq: Every day | RESPIRATORY_TRACT | 11 refills | Status: AC
  Filled 2024-01-02: qty 60, 30d supply, fill #0
  Filled 2024-01-29: qty 60, 30d supply, fill #1
  Filled 2024-03-26 – 2024-03-31 (×2): qty 60, 30d supply, fill #2

## 2024-01-06 ENCOUNTER — Other Ambulatory Visit (HOSPITAL_COMMUNITY): Payer: Self-pay

## 2024-01-06 ENCOUNTER — Other Ambulatory Visit: Payer: Self-pay

## 2024-01-08 ENCOUNTER — Other Ambulatory Visit (HOSPITAL_COMMUNITY): Payer: Self-pay | Admitting: Internal Medicine

## 2024-01-08 ENCOUNTER — Other Ambulatory Visit (HOSPITAL_COMMUNITY): Payer: Self-pay

## 2024-01-08 MED ORDER — FUROSEMIDE 40 MG PO TABS
ORAL_TABLET | ORAL | 0 refills | Status: DC
  Filled 2024-01-08: qty 90, 90d supply, fill #0

## 2024-01-20 ENCOUNTER — Other Ambulatory Visit (HOSPITAL_COMMUNITY): Payer: Self-pay | Admitting: Cardiology

## 2024-01-20 ENCOUNTER — Other Ambulatory Visit (HOSPITAL_COMMUNITY): Payer: Self-pay

## 2024-01-20 MED ORDER — BISOPROLOL FUMARATE 5 MG PO TABS: 2.5000 mg | ORAL_TABLET | Freq: Every day | ORAL | 3 refills | Status: DC

## 2024-01-20 MED ORDER — BISOPROLOL FUMARATE 5 MG PO TABS
2.5000 mg | ORAL_TABLET | Freq: Every day | ORAL | 3 refills | Status: AC
  Filled 2024-01-20 – 2024-02-02 (×2): qty 45, 90d supply, fill #0
  Filled 2024-03-31: qty 45, 90d supply, fill #1

## 2024-01-20 NOTE — Addendum Note (Signed)
 Addended by: Maaliyah Adolph, DALTON HERO on: 01/20/2024 09:10 AM   Modules accepted: Orders

## 2024-01-29 ENCOUNTER — Other Ambulatory Visit (HOSPITAL_COMMUNITY): Payer: Self-pay

## 2024-01-31 ENCOUNTER — Other Ambulatory Visit (HOSPITAL_COMMUNITY): Payer: Self-pay

## 2024-02-02 ENCOUNTER — Other Ambulatory Visit (HOSPITAL_COMMUNITY): Payer: Self-pay

## 2024-02-03 ENCOUNTER — Other Ambulatory Visit: Payer: Self-pay

## 2024-02-03 ENCOUNTER — Other Ambulatory Visit (HOSPITAL_COMMUNITY): Payer: Self-pay

## 2024-02-03 MED ORDER — ROSUVASTATIN CALCIUM 5 MG PO TABS
5.0000 mg | ORAL_TABLET | ORAL | 3 refills | Status: AC
  Filled 2024-03-31: qty 45, 105d supply, fill #0

## 2024-02-06 DIAGNOSIS — I10 Essential (primary) hypertension: Secondary | ICD-10-CM | POA: Diagnosis not present

## 2024-02-06 DIAGNOSIS — E785 Hyperlipidemia, unspecified: Secondary | ICD-10-CM | POA: Diagnosis not present

## 2024-02-06 DIAGNOSIS — Z0000 Encounter for general adult medical examination without abnormal findings: Secondary | ICD-10-CM | POA: Diagnosis not present

## 2024-02-06 DIAGNOSIS — I739 Peripheral vascular disease, unspecified: Secondary | ICD-10-CM | POA: Diagnosis not present

## 2024-02-06 DIAGNOSIS — Z1331 Encounter for screening for depression: Secondary | ICD-10-CM | POA: Diagnosis not present

## 2024-02-06 DIAGNOSIS — K219 Gastro-esophageal reflux disease without esophagitis: Secondary | ICD-10-CM | POA: Diagnosis not present

## 2024-02-06 DIAGNOSIS — I5042 Chronic combined systolic (congestive) and diastolic (congestive) heart failure: Secondary | ICD-10-CM | POA: Diagnosis not present

## 2024-02-06 DIAGNOSIS — M791 Myalgia, unspecified site: Secondary | ICD-10-CM | POA: Diagnosis not present

## 2024-02-06 DIAGNOSIS — N1831 Chronic kidney disease, stage 3a: Secondary | ICD-10-CM | POA: Diagnosis not present

## 2024-02-06 DIAGNOSIS — M810 Age-related osteoporosis without current pathological fracture: Secondary | ICD-10-CM | POA: Diagnosis not present

## 2024-02-06 DIAGNOSIS — I2510 Atherosclerotic heart disease of native coronary artery without angina pectoris: Secondary | ICD-10-CM | POA: Diagnosis not present

## 2024-02-06 DIAGNOSIS — I700 Atherosclerosis of aorta: Secondary | ICD-10-CM | POA: Diagnosis not present

## 2024-02-07 ENCOUNTER — Other Ambulatory Visit (HOSPITAL_COMMUNITY): Payer: Self-pay

## 2024-02-08 ENCOUNTER — Other Ambulatory Visit (HOSPITAL_COMMUNITY): Payer: Self-pay

## 2024-02-08 MED ORDER — ALENDRONATE SODIUM 70 MG PO TABS
70.0000 mg | ORAL_TABLET | ORAL | 11 refills | Status: AC
  Filled 2024-02-08: qty 4, 28d supply, fill #0
  Filled 2024-02-24 – 2024-02-29 (×2): qty 4, 28d supply, fill #1
  Filled 2024-03-31: qty 4, 28d supply, fill #2

## 2024-02-08 MED ORDER — ALBUTEROL SULFATE (2.5 MG/3ML) 0.083% IN NEBU
2.5000 mg | INHALATION_SOLUTION | Freq: Four times a day (QID) | RESPIRATORY_TRACT | 3 refills | Status: AC | PRN
  Filled 2024-02-08: qty 360, 30d supply, fill #0

## 2024-02-08 MED ORDER — ALBUTEROL SULFATE HFA 108 (90 BASE) MCG/ACT IN AERS
1.0000 | INHALATION_SPRAY | RESPIRATORY_TRACT | 5 refills | Status: AC | PRN
  Filled 2024-02-08: qty 6.7, 34d supply, fill #0
  Filled 2024-03-31: qty 6.7, 34d supply, fill #1

## 2024-02-08 MED ORDER — TRELEGY ELLIPTA 100-62.5-25 MCG/ACT IN AEPB
1.0000 | INHALATION_SPRAY | Freq: Every day | RESPIRATORY_TRACT | 5 refills | Status: AC
  Filled 2024-02-08 – 2024-02-23 (×3): qty 180, 90d supply, fill #0
  Filled 2024-03-31: qty 180, 90d supply, fill #1

## 2024-02-08 MED ORDER — LANSOPRAZOLE 30 MG PO CPDR
30.0000 mg | DELAYED_RELEASE_CAPSULE | Freq: Every morning | ORAL | 5 refills | Status: AC
  Filled 2024-02-08: qty 45, 45d supply, fill #0
  Filled 2024-03-26 – 2024-03-31 (×2): qty 45, 45d supply, fill #1

## 2024-02-10 ENCOUNTER — Other Ambulatory Visit: Payer: Self-pay

## 2024-02-10 ENCOUNTER — Other Ambulatory Visit (HOSPITAL_COMMUNITY): Payer: Self-pay

## 2024-02-14 NOTE — Progress Notes (Addendum)
 Teresa [name]                                          MRN: [id number]   03/24/2024   The [facility]BCI Quality Team Specialist reviewed this patient medical record for the purposes of chart review for care gap closure. The following were reviewed: chart review for care gap closure-controlling blood pressure and kidney health evaluation for diabetes:eGFR  and uACR.    VBCI Quality Team

## 2024-02-23 ENCOUNTER — Other Ambulatory Visit (HOSPITAL_COMMUNITY): Payer: Self-pay

## 2024-02-24 ENCOUNTER — Other Ambulatory Visit: Payer: Self-pay

## 2024-02-24 ENCOUNTER — Other Ambulatory Visit (HOSPITAL_COMMUNITY): Payer: Self-pay

## 2024-02-24 MED ORDER — TRIAMCINOLONE ACETONIDE 0.1 % EX CREA
TOPICAL_CREAM | CUTANEOUS | 3 refills | Status: AC
  Filled 2024-02-24: qty 30, 30d supply, fill #0
  Filled 2024-03-26: qty 30, 30d supply, fill #1

## 2024-02-24 MED ORDER — NYSTATIN 100000 UNIT/GM EX CREA
TOPICAL_CREAM | CUTANEOUS | 3 refills | Status: AC
  Filled 2024-02-24: qty 30, 30d supply, fill #0
  Filled 2024-03-26: qty 30, 30d supply, fill #1

## 2024-02-25 ENCOUNTER — Other Ambulatory Visit (HOSPITAL_COMMUNITY): Payer: Self-pay

## 2024-03-11 ENCOUNTER — Other Ambulatory Visit (HOSPITAL_COMMUNITY): Payer: Self-pay

## 2024-03-26 ENCOUNTER — Other Ambulatory Visit: Payer: Self-pay

## 2024-03-31 ENCOUNTER — Other Ambulatory Visit (HOSPITAL_COMMUNITY): Payer: Self-pay | Admitting: Internal Medicine

## 2024-03-31 ENCOUNTER — Other Ambulatory Visit: Payer: Self-pay

## 2024-03-31 ENCOUNTER — Other Ambulatory Visit (HOSPITAL_COMMUNITY): Payer: Self-pay

## 2024-03-31 MED ORDER — FUROSEMIDE 40 MG PO TABS
40.0000 mg | ORAL_TABLET | ORAL | 0 refills | Status: AC | PRN
  Filled 2024-03-31: qty 90, 90d supply, fill #0

## 2024-04-01 ENCOUNTER — Other Ambulatory Visit: Payer: Self-pay

## 2024-05-28 ENCOUNTER — Ambulatory Visit: Admitting: Cardiovascular Disease

## 2024-06-16 ENCOUNTER — Ambulatory Visit (HOSPITAL_COMMUNITY)

## 2024-06-16 ENCOUNTER — Ambulatory Visit: Admitting: Vascular Surgery
